# Patient Record
Sex: Male | Born: 1940
Health system: Southern US, Community
[De-identification: ages and names within clinical notes are randomized; demographics above are authoritative.]

## PROBLEM LIST (undated history)

## (undated) DIAGNOSIS — G629 Polyneuropathy, unspecified: Secondary | ICD-10-CM

## (undated) DIAGNOSIS — R011 Cardiac murmur, unspecified: Secondary | ICD-10-CM

## (undated) DIAGNOSIS — T7840XA Allergy, unspecified, initial encounter: Secondary | ICD-10-CM

## (undated) DIAGNOSIS — I4891 Unspecified atrial fibrillation: Secondary | ICD-10-CM

## (undated) DIAGNOSIS — I639 Cerebral infarction, unspecified: Secondary | ICD-10-CM

## (undated) DIAGNOSIS — C4492 Squamous cell carcinoma of skin, unspecified: Secondary | ICD-10-CM

## (undated) DIAGNOSIS — I059 Rheumatic mitral valve disease, unspecified: Secondary | ICD-10-CM

## (undated) DIAGNOSIS — N400 Enlarged prostate without lower urinary tract symptoms: Secondary | ICD-10-CM

## (undated) DIAGNOSIS — I10 Essential (primary) hypertension: Secondary | ICD-10-CM

## (undated) DIAGNOSIS — C189 Malignant neoplasm of colon, unspecified: Secondary | ICD-10-CM

## (undated) DIAGNOSIS — F419 Anxiety disorder, unspecified: Secondary | ICD-10-CM

## (undated) DIAGNOSIS — E785 Hyperlipidemia, unspecified: Secondary | ICD-10-CM

## (undated) DIAGNOSIS — R7309 Other abnormal glucose: Secondary | ICD-10-CM

## (undated) DIAGNOSIS — K219 Gastro-esophageal reflux disease without esophagitis: Secondary | ICD-10-CM

## (undated) DIAGNOSIS — Z972 Presence of dental prosthetic device (complete) (partial): Secondary | ICD-10-CM

## (undated) DIAGNOSIS — M1990 Unspecified osteoarthritis, unspecified site: Secondary | ICD-10-CM

## (undated) DIAGNOSIS — R1031 Right lower quadrant pain: Secondary | ICD-10-CM

## (undated) DIAGNOSIS — R972 Elevated prostate specific antigen [PSA]: Secondary | ICD-10-CM

## (undated) HISTORY — DX: Rheumatic mitral valve disease, unspecified: I05.9

## (undated) HISTORY — DX: Cardiac murmur, unspecified: R01.1

## (undated) HISTORY — DX: Malignant neoplasm of colon, unspecified: C18.9

## (undated) HISTORY — DX: Unspecified osteoarthritis, unspecified site: M19.90

## (undated) HISTORY — DX: Benign prostatic hyperplasia without lower urinary tract symptoms: N40.0

## (undated) HISTORY — DX: Allergy, unspecified, initial encounter: T78.40XA

## (undated) HISTORY — DX: Hyperlipidemia, unspecified: E78.5

## (undated) HISTORY — DX: Elevated prostate specific antigen (PSA): R97.20

## (undated) HISTORY — DX: Right lower quadrant pain: R10.31

## (undated) HISTORY — DX: Gastro-esophageal reflux disease without esophagitis: K21.9

## (undated) HISTORY — DX: Anxiety disorder, unspecified: F41.9

## (undated) HISTORY — PX: MITRAL VALVE ANNULOPLASTY: SHX2038

## (undated) HISTORY — DX: Squamous cell carcinoma of skin, unspecified: C44.92

## (undated) HISTORY — DX: Other abnormal glucose: R73.09

## (undated) HISTORY — DX: Presence of dental prosthetic device (complete) (partial): Z97.2

---

## 1988-02-23 HISTORY — PX: COLECTOMY: SHX59

## 1999-05-21 ENCOUNTER — Other Ambulatory Visit: Admission: RE | Admit: 1999-05-21 | Discharge: 1999-05-21 | Payer: Self-pay | Admitting: Gastroenterology

## 1999-05-21 ENCOUNTER — Encounter (INDEPENDENT_AMBULATORY_CARE_PROVIDER_SITE_OTHER): Payer: Self-pay | Admitting: Specialist

## 2001-09-26 ENCOUNTER — Encounter: Admission: RE | Admit: 2001-09-26 | Discharge: 2001-09-26 | Payer: Self-pay | Admitting: Internal Medicine

## 2001-09-26 ENCOUNTER — Encounter: Payer: Self-pay | Admitting: Internal Medicine

## 2004-03-11 ENCOUNTER — Ambulatory Visit: Payer: Self-pay | Admitting: Internal Medicine

## 2004-03-16 ENCOUNTER — Ambulatory Visit: Payer: Self-pay

## 2004-03-20 ENCOUNTER — Ambulatory Visit: Payer: Self-pay | Admitting: Internal Medicine

## 2004-03-24 ENCOUNTER — Ambulatory Visit: Payer: Self-pay | Admitting: Cardiology

## 2004-03-27 ENCOUNTER — Ambulatory Visit: Payer: Self-pay | Admitting: Internal Medicine

## 2004-04-06 ENCOUNTER — Ambulatory Visit: Payer: Self-pay | Admitting: Internal Medicine

## 2004-04-06 ENCOUNTER — Ambulatory Visit: Payer: Self-pay | Admitting: Oncology

## 2004-04-08 ENCOUNTER — Ambulatory Visit: Payer: Self-pay | Admitting: Cardiology

## 2004-04-10 ENCOUNTER — Ambulatory Visit: Payer: Self-pay | Admitting: Internal Medicine

## 2004-04-24 ENCOUNTER — Ambulatory Visit: Payer: Self-pay | Admitting: Cardiology

## 2004-04-24 ENCOUNTER — Encounter: Payer: Self-pay | Admitting: Cardiology

## 2004-04-24 ENCOUNTER — Ambulatory Visit (HOSPITAL_COMMUNITY): Admission: RE | Admit: 2004-04-24 | Discharge: 2004-04-24 | Payer: Self-pay | Admitting: Cardiology

## 2004-05-11 ENCOUNTER — Ambulatory Visit: Payer: Self-pay | Admitting: Internal Medicine

## 2004-05-14 ENCOUNTER — Ambulatory Visit: Payer: Self-pay | Admitting: Cardiology

## 2004-05-19 ENCOUNTER — Inpatient Hospital Stay (HOSPITAL_BASED_OUTPATIENT_CLINIC_OR_DEPARTMENT_OTHER): Admission: RE | Admit: 2004-05-19 | Discharge: 2004-05-19 | Payer: Self-pay | Admitting: Cardiology

## 2004-07-27 ENCOUNTER — Encounter: Admission: RE | Admit: 2004-07-27 | Discharge: 2004-10-25 | Payer: Self-pay | Admitting: Cardiology

## 2004-09-04 ENCOUNTER — Ambulatory Visit: Payer: Self-pay | Admitting: Internal Medicine

## 2004-12-11 ENCOUNTER — Ambulatory Visit: Payer: Self-pay | Admitting: Internal Medicine

## 2005-01-12 ENCOUNTER — Ambulatory Visit: Payer: Self-pay | Admitting: Internal Medicine

## 2005-03-09 ENCOUNTER — Ambulatory Visit: Payer: Self-pay | Admitting: Internal Medicine

## 2005-04-15 ENCOUNTER — Ambulatory Visit: Payer: Self-pay | Admitting: Oncology

## 2005-09-23 ENCOUNTER — Ambulatory Visit: Payer: Self-pay | Admitting: Internal Medicine

## 2006-01-26 ENCOUNTER — Ambulatory Visit: Payer: Self-pay | Admitting: Internal Medicine

## 2006-04-28 ENCOUNTER — Ambulatory Visit: Payer: Self-pay | Admitting: Oncology

## 2006-05-03 LAB — CBC WITH DIFFERENTIAL/PLATELET
Basophils Absolute: 0 10*3/uL (ref 0.0–0.1)
EOS%: 1.9 % (ref 0.0–7.0)
HGB: 15.5 g/dL (ref 13.0–17.1)
MCH: 28.7 pg (ref 28.0–33.4)
NEUT#: 3.7 10*3/uL (ref 1.5–6.5)
RBC: 5.4 10*6/uL (ref 4.20–5.71)
RDW: 13.2 % (ref 11.2–14.6)
lymph#: 1.2 10*3/uL (ref 0.9–3.3)

## 2006-05-03 LAB — COMPREHENSIVE METABOLIC PANEL
ALT: 20 U/L (ref 0–53)
AST: 20 U/L (ref 0–37)
Albumin: 4.3 g/dL (ref 3.5–5.2)
Alkaline Phosphatase: 42 U/L (ref 39–117)
BUN: 14 mg/dL (ref 6–23)
Calcium: 9.4 mg/dL (ref 8.4–10.5)
Chloride: 105 mEq/L (ref 96–112)
Potassium: 4.2 mEq/L (ref 3.5–5.3)
Sodium: 142 mEq/L (ref 135–145)
Total Protein: 7 g/dL (ref 6.0–8.3)

## 2006-05-03 LAB — CEA: CEA: 1.6 ng/mL (ref 0.0–5.0)

## 2006-05-27 ENCOUNTER — Encounter: Payer: Self-pay | Admitting: Internal Medicine

## 2006-06-15 ENCOUNTER — Encounter: Payer: Self-pay | Admitting: Internal Medicine

## 2006-06-15 ENCOUNTER — Ambulatory Visit: Payer: Self-pay | Admitting: Internal Medicine

## 2006-06-15 DIAGNOSIS — E785 Hyperlipidemia, unspecified: Secondary | ICD-10-CM

## 2006-06-15 DIAGNOSIS — I059 Rheumatic mitral valve disease, unspecified: Secondary | ICD-10-CM

## 2006-06-15 DIAGNOSIS — J309 Allergic rhinitis, unspecified: Secondary | ICD-10-CM

## 2006-06-15 DIAGNOSIS — Z85038 Personal history of other malignant neoplasm of large intestine: Secondary | ICD-10-CM | POA: Insufficient documentation

## 2006-06-15 HISTORY — DX: Hyperlipidemia, unspecified: E78.5

## 2006-06-15 LAB — CONVERTED CEMR LAB
Cholesterol: 204 mg/dL (ref 0–200)
Direct LDL: 135.6 mg/dL
HDL: 49.6 mg/dL (ref >39.0)
Total CHOL/HDL Ratio: 4.1
Triglycerides: 63 mg/dL (ref 0–149)
VLDL: 13 mg/dL (ref 0–40)

## 2006-07-05 ENCOUNTER — Ambulatory Visit: Payer: Self-pay | Admitting: Internal Medicine

## 2006-10-13 ENCOUNTER — Encounter: Payer: Self-pay | Admitting: Internal Medicine

## 2007-01-23 ENCOUNTER — Encounter: Payer: Self-pay | Admitting: Internal Medicine

## 2007-02-08 ENCOUNTER — Encounter: Payer: Self-pay | Admitting: Internal Medicine

## 2007-03-10 ENCOUNTER — Telehealth (INDEPENDENT_AMBULATORY_CARE_PROVIDER_SITE_OTHER): Payer: Self-pay | Admitting: *Deleted

## 2007-03-10 ENCOUNTER — Ambulatory Visit: Payer: Self-pay | Admitting: Internal Medicine

## 2007-05-03 ENCOUNTER — Ambulatory Visit: Payer: Self-pay | Admitting: Oncology

## 2007-05-05 ENCOUNTER — Encounter: Payer: Self-pay | Admitting: Internal Medicine

## 2007-05-05 LAB — COMPREHENSIVE METABOLIC PANEL
ALT: 17 U/L (ref 0–53)
AST: 22 U/L (ref 0–37)
Albumin: 4.5 g/dL (ref 3.5–5.2)
Calcium: 8.9 mg/dL (ref 8.4–10.5)
Chloride: 104 mEq/L (ref 96–112)
Creatinine, Ser: 0.94 mg/dL (ref 0.40–1.50)
Potassium: 4.2 mEq/L (ref 3.5–5.3)
Sodium: 142 mEq/L (ref 135–145)
Total Protein: 6.9 g/dL (ref 6.0–8.3)

## 2007-05-05 LAB — CBC WITH DIFFERENTIAL/PLATELET
BASO%: 0.8 % (ref 0.0–2.0)
EOS%: 2 % (ref 0.0–7.0)
MCH: 29.2 pg (ref 28.0–33.4)
MCHC: 35.1 g/dL (ref 32.0–35.9)
RBC: 5.35 10*6/uL (ref 4.20–5.71)
RDW: 12.8 % (ref 11.2–14.6)
lymph#: 1.3 10*3/uL (ref 0.9–3.3)

## 2007-05-05 LAB — CEA: CEA: 1 ng/mL (ref 0.0–5.0)

## 2007-05-05 LAB — PSA: PSA: 3.52 ng/mL (ref 0.10–4.00)

## 2007-05-23 ENCOUNTER — Encounter: Payer: Self-pay | Admitting: Internal Medicine

## 2007-06-22 ENCOUNTER — Ambulatory Visit: Payer: Self-pay | Admitting: Internal Medicine

## 2007-06-28 LAB — CONVERTED CEMR LAB
ALT: 20 units/L (ref 0–53)
AST: 24 units/L (ref 0–37)
BUN: 17 mg/dL (ref 6–23)
Basophils Absolute: 0 10*3/uL (ref 0.0–0.1)
Basophils Relative: 0.8 % (ref 0.0–1.0)
CO2: 32 meq/L (ref 19–32)
Calcium: 9.2 mg/dL (ref 8.4–10.5)
Chloride: 106 meq/L (ref 96–112)
Cholesterol: 152 mg/dL (ref 0–200)
Creatinine, Ser: 0.9 mg/dL (ref 0.4–1.5)
Eosinophils Absolute: 0.2 10*3/uL (ref 0.0–0.7)
Eosinophils Relative: 5 % (ref 0.0–5.0)
GFR calc Af Amer: 108 mL/min
GFR calc non Af Amer: 89 mL/min
Glucose, Bld: 114 mg/dL — ABNORMAL HIGH (ref 70–99)
HCT: 45.2 % (ref 39.0–52.0)
HDL: 47.1 mg/dL (ref >39.0)
Hemoglobin: 15.4 g/dL (ref 13.0–17.0)
Hgb A1c MFr Bld: 5.6 % (ref 4.6–6.0)
LDL Cholesterol: 96 mg/dL (ref 0–99)
Lymphocytes Relative: 35.4 % (ref 12.0–46.0)
MCHC: 34.2 g/dL (ref 30.0–36.0)
MCV: 86.2 fL (ref 78.0–100.0)
Monocytes Absolute: 0.2 10*3/uL (ref 0.1–1.0)
Monocytes Relative: 6.3 % (ref 3.0–12.0)
Neutro Abs: 1.9 10*3/uL (ref 1.4–7.7)
Neutrophils Relative %: 52.5 % (ref 43.0–77.0)
Platelets: 211 10*3/uL (ref 150–400)
Potassium: 4.1 meq/L (ref 3.5–5.1)
RBC: 5.24 M/uL (ref 4.22–5.81)
RDW: 12.2 % (ref 11.5–14.6)
Sodium: 141 meq/L (ref 135–145)
TSH: 1.9 microintl units/mL (ref 0.35–5.50)
Total CHOL/HDL Ratio: 3.2
Triglycerides: 44 mg/dL (ref 0–149)
VLDL: 9 mg/dL (ref 0–40)
WBC: 3.6 10*3/uL — ABNORMAL LOW (ref 4.5–10.5)

## 2007-08-18 ENCOUNTER — Ambulatory Visit: Payer: Self-pay | Admitting: Gastroenterology

## 2007-08-30 ENCOUNTER — Ambulatory Visit: Payer: Self-pay | Admitting: Gastroenterology

## 2007-11-07 ENCOUNTER — Encounter: Payer: Self-pay | Admitting: Internal Medicine

## 2007-11-07 LAB — CONVERTED CEMR LAB: PSA: 3.4 ng/mL

## 2007-11-17 ENCOUNTER — Ambulatory Visit: Payer: Self-pay | Admitting: Internal Medicine

## 2007-11-29 ENCOUNTER — Ambulatory Visit: Payer: Self-pay | Admitting: Internal Medicine

## 2007-12-22 ENCOUNTER — Ambulatory Visit: Payer: Self-pay | Admitting: Internal Medicine

## 2008-01-23 ENCOUNTER — Encounter: Payer: Self-pay | Admitting: Internal Medicine

## 2008-05-13 ENCOUNTER — Encounter: Payer: Self-pay | Admitting: Internal Medicine

## 2008-05-15 ENCOUNTER — Encounter: Payer: Self-pay | Admitting: Internal Medicine

## 2008-07-31 ENCOUNTER — Encounter: Payer: Self-pay | Admitting: Internal Medicine

## 2008-07-31 LAB — CONVERTED CEMR LAB: PSA: 3.7 ng/mL

## 2008-09-12 ENCOUNTER — Ambulatory Visit: Payer: Self-pay | Admitting: Internal Medicine

## 2008-09-30 ENCOUNTER — Ambulatory Visit: Payer: Self-pay | Admitting: Internal Medicine

## 2008-10-16 ENCOUNTER — Ambulatory Visit: Payer: Self-pay | Admitting: Internal Medicine

## 2008-10-16 DIAGNOSIS — N400 Enlarged prostate without lower urinary tract symptoms: Secondary | ICD-10-CM

## 2008-10-16 DIAGNOSIS — R7309 Other abnormal glucose: Secondary | ICD-10-CM

## 2008-10-16 DIAGNOSIS — R739 Hyperglycemia, unspecified: Secondary | ICD-10-CM | POA: Insufficient documentation

## 2008-10-16 HISTORY — DX: Other abnormal glucose: R73.09

## 2008-10-16 HISTORY — DX: Benign prostatic hyperplasia without lower urinary tract symptoms: N40.0

## 2008-10-16 LAB — CONVERTED CEMR LAB: Vit D, 25-Hydroxy: 39 ng/mL (ref 30–89)

## 2008-10-18 ENCOUNTER — Encounter (INDEPENDENT_AMBULATORY_CARE_PROVIDER_SITE_OTHER): Payer: Self-pay | Admitting: *Deleted

## 2008-10-21 LAB — CONVERTED CEMR LAB
ALT: 21 units/L (ref 0–53)
AST: 23 units/L (ref 0–37)
BUN: 14 mg/dL (ref 6–23)
Basophils Absolute: 0 10*3/uL (ref 0.0–0.1)
Basophils Relative: 0.3 % (ref 0.0–3.0)
CO2: 31 meq/L (ref 19–32)
Calcium: 9.3 mg/dL (ref 8.4–10.5)
Chloride: 105 meq/L (ref 96–112)
Cholesterol: 159 mg/dL (ref 0–200)
Creatinine, Ser: 1 mg/dL (ref 0.4–1.5)
Eosinophils Absolute: 0.1 10*3/uL (ref 0.0–0.7)
Eosinophils Relative: 2.1 % (ref 0.0–5.0)
GFR calc non Af Amer: 78.48 mL/min (ref >60)
Glucose, Bld: 84 mg/dL (ref 70–99)
HCT: 43.5 % (ref 39.0–52.0)
HDL: 51.1 mg/dL (ref >39.00)
Hemoglobin: 15.1 g/dL (ref 13.0–17.0)
Hgb A1c MFr Bld: 5.5 % (ref 4.6–6.5)
LDL Cholesterol: 97 mg/dL (ref 0–99)
Lymphocytes Relative: 25.9 % (ref 12.0–46.0)
Lymphs Abs: 1.4 10*3/uL (ref 0.7–4.0)
MCHC: 34.6 g/dL (ref 30.0–36.0)
MCV: 84.7 fL (ref 78.0–100.0)
Monocytes Absolute: 0.2 10*3/uL (ref 0.1–1.0)
Monocytes Relative: 4.2 % (ref 3.0–12.0)
Neutro Abs: 3.8 10*3/uL (ref 1.4–7.7)
Neutrophils Relative %: 67.5 % (ref 43.0–77.0)
Platelets: 177 10*3/uL (ref 150.0–400.0)
Potassium: 4.1 meq/L (ref 3.5–5.1)
RBC: 5.14 M/uL (ref 4.22–5.81)
RDW: 12.5 % (ref 11.5–14.6)
Sodium: 141 meq/L (ref 135–145)
Total CHOL/HDL Ratio: 3
Triglycerides: 53 mg/dL (ref 0.0–149.0)
VLDL: 10.6 mg/dL (ref 0.0–40.0)
WBC: 5.5 10*3/uL (ref 4.5–10.5)

## 2008-10-29 ENCOUNTER — Encounter: Payer: Self-pay | Admitting: Internal Medicine

## 2008-10-29 ENCOUNTER — Telehealth: Payer: Self-pay | Admitting: Internal Medicine

## 2009-01-28 ENCOUNTER — Encounter: Payer: Self-pay | Admitting: Internal Medicine

## 2009-02-04 ENCOUNTER — Encounter: Payer: Self-pay | Admitting: Internal Medicine

## 2009-03-12 ENCOUNTER — Telehealth (INDEPENDENT_AMBULATORY_CARE_PROVIDER_SITE_OTHER): Payer: Self-pay | Admitting: *Deleted

## 2009-06-09 ENCOUNTER — Encounter: Payer: Self-pay | Admitting: Internal Medicine

## 2009-06-20 ENCOUNTER — Telehealth (INDEPENDENT_AMBULATORY_CARE_PROVIDER_SITE_OTHER): Payer: Self-pay | Admitting: *Deleted

## 2009-06-23 ENCOUNTER — Ambulatory Visit: Payer: Self-pay | Admitting: Internal Medicine

## 2009-09-09 ENCOUNTER — Encounter: Payer: Self-pay | Admitting: Internal Medicine

## 2009-10-21 ENCOUNTER — Encounter: Payer: Self-pay | Admitting: Internal Medicine

## 2009-11-07 ENCOUNTER — Encounter: Payer: Self-pay | Admitting: Internal Medicine

## 2009-12-11 ENCOUNTER — Encounter: Payer: Self-pay | Admitting: Internal Medicine

## 2010-03-22 LAB — CONVERTED CEMR LAB: PSA: 3.3 ng/mL

## 2010-03-26 NOTE — Letter (Signed)
Summary: Rx contin. observation---- [facility]  [facility]   Imported By: Lanelle Bal 11/18/2009 12:56:35  _____________________________________________________________________  External Attachment:    Type:   Image     Comment:   External Document

## 2010-03-26 NOTE — Assessment & Plan Note (Signed)
Summary: bp check/lch  Nurse Visit   Vital Signs:  Patient profile:   70 year old male Height:      69.5 inches Weight:      179.2 pounds Pulse rate:   60 / minute BP sitting:   102 / 60  Vitals Entered By: Shary Decamp (Jun 23, 2009 3:51 PM)  Past History:  Past Medical History: Allergic rhinitis Hyperlipidemia MV recontruction at [facility] 2006----still needs ABX for SBE prophylaxis  Colon Cancer s/p partial colectomy 1990,   Cscope 7-  2009; (-) , next in 5 years (2014) Needs ABX PROPHYLAXIS before dental -GI-GU procedures  (-) Cardiolite 2006 BPH w/  borderline elevated PSA , sees urology   Past Surgical History: Reviewed history from 09/30/2008 and no changes required. MV recontruction at [facility] 2006 Colon Cancer s/p partial colectomy 1990   Physical Exam  General:  alert and well-developed.   Lungs:  normal respiratory effort, no intercostal retractions, no accessory muscle use, and normal breath sounds.   Heart:  normal rate, regular rhythm, and no murmur.   Msk:  left shoulder normal right shoulder normal except for slightly decreased range of motion Psych:  not anxious appearing and not depressed appearing.     Review of Systems        MV recontruction-- saw surgeon 4-011,  note reviewed still needs Abx for SBE  CV:  Denies chest pain or discomfort and swelling of feet; good ambulatory BPs . Resp:  Denies cough and shortness of breath.   Impression & Recommendations:  Problem # 1:  HYPERLIPIDEMIA (ICD-272.4) no change, rf medication His updated medication list for this problem includes:    Simvastatin 20 Mg Tabs (Simvastatin) .Marland Kitchen... 1 by mouth once daily  Labs Reviewed: SGOT: 23 (10/16/2008)   SGPT: 21 (10/16/2008)   HDL:51.10 (10/16/2008), 47.1 (06/22/2007)  LDL:97 (10/16/2008), 96 (06/22/2007)  Chol:159 (10/16/2008), 152 (06/22/2007)  Trig:53.0 (10/16/2008), 44 (06/22/2007)  Problem # 2:  DISORDER, MITRAL VALVE (ICD-424.0) stable His updated medication  list for this problem includes:    Aspirin 325 Mg Tabs (Aspirin)  Problem # 3:  shoulder pain plans to do some exercises and  let me  know if no better, consider orthopedic surgery referral  Complete Medication List: 1)  Simvastatin 20 Mg Tabs (Simvastatin) .Marland Kitchen.. 1 by mouth once daily 2)  Aspirin 325 Mg Tabs (Aspirin) 3)  Mvi  4)  Doxazosin Mesylate 2 Mg Tabs (Doxazosin mesylate) .Marland Kitchen.. 1 by mouth once daily 5)  Needs Abx For Sbe Prophylaxis    Patient Instructions: 1)  Please schedule a follow-up appointment in 6 months  (fasting-- yearly)   History of Present Illness: ROV feels well   mild right shoulder pain for a while, on and off, symptoms are triggered by elevated and moving forward his right arm    CC: rov   Current Medications (verified): 1)  Simvastatin 20 Mg  Tabs (Simvastatin) .Marland Kitchen.. 1 By Mouth Once Daily - Due Office Visit For Additional Refills 2)  Aspirin 325 Mg  Tabs (Aspirin) 3)  Mvi 4)  Doxazosin Mesylate 2 Mg Tabs (Doxazosin Mesylate) .Marland Kitchen.. 1 By Mouth Once Daily  Allergies (verified): No Known Drug Allergies  Orders Added: 1)  Est. Patient Level III [16109] Prescriptions: SIMVASTATIN 20 MG  TABS (SIMVASTATIN) 1 by mouth once daily  #90 x 3   Entered and Authorized by:   Nolon Rod. Shiara Mcgough MD   Signed by:   Nolon Rod. Jaise Moser MD on 06/23/2009   Method  used:   Electronically to        [facility]* (retail)       [address] [place], [place]  [address]       Ph: [phone]       Fax: [phone]   RxID:   [id number]

## 2010-03-26 NOTE — Letter (Signed)
Summary: plan varicose vein treatment---[place] MD  Consuela Mimes MD   Imported By: Lanelle Bal 01/08/2010 13:10:58  _____________________________________________________________________  External Attachment:    Type:   Image     Comment:   External Document

## 2010-03-26 NOTE — Letter (Signed)
Summary: venous insuf.,cont. conservative Rx---[facility]  [facility]   Imported By: Lanelle Bal 11/06/2009 10:39:40  _____________________________________________________________________  External Attachment:    Type:   Image     Comment:   External Document

## 2010-03-26 NOTE — Progress Notes (Signed)
Summary: ?change dx code  Phone Note Call from Patient   Summary of Call: Vitamin D (test code 16109)  (CPT [phone]) lab was coded as V82.81 for DOS 10/29/08.   Medicare is denying that code.  Can you change dx code?  Patient has received a bill for $155.75. Patient's acct # with spectrum [id number]; [facility] [phone] Shary Decamp  March 12, 2009 3:27 PM   Follow-up for Phone Call        we can try any of these: HYPERGLYCEMIA, BORDERLINE (ICD-790.29) HYPERTROPHY PROSTATE W/O UR OBST & OTH LUTS (ICD-600.00) DISORDER, MITRAL VALVE (ICD-424.0) HYPERLIPIDEMIA (ICD-272.4) COLON CANCER, HX OF (ICD-V10.05) ALLERGIC RHINITIS (ICD-477.9) Jose E. [name] MD  March 13, 2009 8:40 AM    Additional Follow-up for Phone Call Additional follow up Details #1::        ---- Converted from flag ---- ---- 03/13/2009 12:48 PM, Floydene Flock wrote: resubmitted with [facility] today 03-13-09 ------------------------------

## 2010-03-26 NOTE — Letter (Signed)
Summary: [facility]  [facility]   Imported By: Lanelle Bal 06/23/2009 11:12:37  _____________________________________________________________________  External Attachment:    Type:   Image     Comment:   External Document

## 2010-03-26 NOTE — Letter (Signed)
Summary: [facility]  [facility]   Imported By: Lanelle Bal 10/13/2009 11:28:01  _____________________________________________________________________  External Attachment:    Type:   Image     Comment:   External Document

## 2010-03-26 NOTE — Progress Notes (Signed)
Summary: due ov  Phone Note Outgoing Call Call back at [facility] Phone [phone] Call back at Work Phone [phone]   Summary of Call: due office visit Kevin Bass  June 20, 2009 1:48 PM   Follow-up for Phone Call        patient is coming in on monday 5.2.11 Follow-up by: Harold Barban,  June 20, 2009 2:05 PM

## 2010-04-16 ENCOUNTER — Telehealth: Payer: Self-pay | Admitting: Internal Medicine

## 2010-04-20 ENCOUNTER — Encounter: Payer: Self-pay | Admitting: Internal Medicine

## 2010-04-21 ENCOUNTER — Other Ambulatory Visit: Payer: Self-pay | Admitting: Internal Medicine

## 2010-04-21 ENCOUNTER — Encounter: Payer: Self-pay | Admitting: Internal Medicine

## 2010-04-21 ENCOUNTER — Ambulatory Visit (INDEPENDENT_AMBULATORY_CARE_PROVIDER_SITE_OTHER): Payer: Medicare Other | Admitting: Internal Medicine

## 2010-04-21 DIAGNOSIS — I059 Rheumatic mitral valve disease, unspecified: Secondary | ICD-10-CM

## 2010-04-21 DIAGNOSIS — Z0000 Encounter for general adult medical examination without abnormal findings: Secondary | ICD-10-CM

## 2010-04-21 DIAGNOSIS — E785 Hyperlipidemia, unspecified: Secondary | ICD-10-CM

## 2010-04-21 DIAGNOSIS — M25519 Pain in unspecified shoulder: Secondary | ICD-10-CM

## 2010-04-21 DIAGNOSIS — R7309 Other abnormal glucose: Secondary | ICD-10-CM

## 2010-04-21 DIAGNOSIS — Z23 Encounter for immunization: Secondary | ICD-10-CM

## 2010-04-21 LAB — LIPID PANEL
Cholesterol: 189 mg/dL (ref 0–200)
HDL: 58.2 mg/dL (ref >39.00)
Total CHOL/HDL Ratio: 3
Triglycerides: 57 mg/dL (ref 0.0–149.0)

## 2010-04-21 LAB — CBC WITH DIFFERENTIAL/PLATELET
Basophils Relative: 0.6 % (ref 0.0–3.0)
Eosinophils Absolute: 0.1 10*3/uL (ref 0.0–0.7)
Lymphocytes Relative: 31.1 % (ref 12.0–46.0)
MCHC: 34.6 g/dL (ref 30.0–36.0)
Neutrophils Relative %: 61.5 % (ref 43.0–77.0)
Platelets: 205 10*3/uL (ref 150.0–400.0)
RBC: 5.37 Mil/uL (ref 4.22–5.81)
WBC: 6.2 10*3/uL (ref 4.5–10.5)

## 2010-04-21 LAB — BASIC METABOLIC PANEL
BUN: 18 mg/dL (ref 6–23)
CO2: 30 mEq/L (ref 19–32)
Calcium: 9.2 mg/dL (ref 8.4–10.5)
Creatinine, Ser: 0.9 mg/dL (ref 0.4–1.5)

## 2010-04-21 LAB — AST: AST: 22 U/L (ref 0–37)

## 2010-04-21 LAB — CK: Total CK: 101 U/L (ref 7–232)

## 2010-04-21 LAB — HEMOGLOBIN A1C: Hgb A1c MFr Bld: 5.7 % (ref 4.6–6.5)

## 2010-04-21 LAB — SEDIMENTATION RATE: Sed Rate: 3 mm/hr (ref 0–22)

## 2010-04-21 NOTE — Progress Notes (Signed)
Summary: Muscle pain--stopped statin  Phone Note Call from Patient Call back at [phone]   Summary of Call: Patient left message on triage requesting a call about his Simvastatin and muscle pain. Patient has been on the med for 2-3 years and muscle pain began approximately 6 months ago. He stopped the med yesterday and called our office today for advise. He notes that his nephew has a similar episode and stopped Lipitor but began Niacin. Pt would like to know if that is what he should do. Please advise. Initial call taken by: Lucious Groves CMA,  April 16, 2010 2:54 PM  Follow-up for Phone Call         hold statins for now, he is due for a physical exam, fasting. Please schedule  Follow-up by: Jose E. [name] MD,  April 16, 2010 5:35 PM  Additional Follow-up for Phone Call Additional follow up Details #1::        I spoke w/ pt he is aware- CPX scheduled. Army Fossa CMA  April 17, 2010 9:44 AM

## 2010-04-23 ENCOUNTER — Encounter: Payer: Self-pay | Admitting: Internal Medicine

## 2010-04-30 NOTE — Assessment & Plan Note (Signed)
Summary: cpe/ok per danielle/kn   Vital Signs:  Patient profile:   70 year old male Height:      69.5 inches Weight:      179.38 pounds BMI:     26.20 Pulse rate:   82 / minute Pulse rhythm:   regular BP sitting:   126 / 84  (left arm) Cuff size:   large  Vitals Entered By: Army Fossa CMA (April 21, 2010 1:12 PM)   CC: CPX, fasting  Comments c/o pain still in both shoulders [address]    History of Present Illness: Here for Medicare AWV: 1.   Risk factors based on Past M, S, F history: review  2.   Physical Activities: active at home, yard , walks outinely  3.   Depression/mood: no problems reported or noted  4.   Hearing: no problems reported or noted  5.   ADL's:  totally independent 6.   Fall Risk: low risk, no recent falls  7.   Home Safety: does feel safe at home  8.   Height, weight, &visual acuity: see Vs, no problems reported or noted  9.   Counseling:  yes  10.   Labs ordered based on risk factors: yes 11.           Referral Coordination, if needed  12.           Care Plan, see a/p  13.            Cognitive Assessment , motor skills  cognition and coordination seem normal   in addition, we discussed the following :  B shoulder pain x 6 months, L>R, at the [facility] joint area and deltoid area, worse at night saw ortho a month ago, XRs done , Rx aleve, no help told he may need a MRI self d/c statins 1 week ago: slightly  better?  see review of systems  Hyperlipidemia-- see above   MV recontruction at [facility] 2006---- saw cards yesterday, has a ECHO done, results pening   BPH -- sees urology routinely, last OV 9-11, Rx observation and PSAs annually   Preventive Screening-Counseling & Management  Alcohol-Tobacco     Alcohol type: wine  Caffeine-Diet-Exercise     Type of exercise: walking  Current Medications (verified): 1)  Simvastatin 20 Mg  Tabs (Simvastatin) .Marland Kitchen.. 1 By Mouth Once Daily 2)  Aspirin 325 Mg  Tabs (Aspirin) 3)  Mvi 4)   Doxazosin Mesylate 2 Mg Tabs (Doxazosin Mesylate) .Marland Kitchen.. 1 By Mouth Once Daily 5)  Needs Abx For Sbe Prophylaxis  Allergies (verified): No Known Drug Allergies  Past History:  Past Medical History: Allergic rhinitis Hyperlipidemia MV recontruction at [facility] 2006----still needs ABX for SBE prophylaxis  Colon Cancer s/p partial colectomy 1990,   Cscope 7-  2009; (-) , next in 5 years (2014) Needs ABX PROPHYLAXIS before dental -GI-GU procedures  (-) Cardiolite 2006 BPH w/  borderline elevated PSA , sees urology  hyperglycemia , A1C  ~  5.5  Past Surgical History: Reviewed history from 09/30/2008 and no changes required. MV recontruction at [facility] 2006 Colon Cancer s/p partial colectomy 1990  Family History: Reviewed history from 10/16/2008 and no changes required. strokes-no heart attacks--no diabetes--no colon ca--  M side (cousines) prostate ca-- not to his knowledge   Social History: Reviewed history from 10/16/2008 and no changes required. Married 1 son and 1 daughter  from [place] Former Smoker-- quit in the 80s Alcohol use-yes (occasionally) Regular exercise-yes diet-- healthy  Review of Systems General:  Denies fatigue and fever;  Denies any fevers, headaches, neck pain or leg pain. no Amaurosix-fugax. CV:  Denies chest pain or discomfort and swelling of feet. Resp:  Denies cough and shortness of breath. GI:  Denies bloody stools, diarrhea, nausea, and vomiting.  Physical Exam  General:  alert, well-developed, and well-nourished.   Head:   no tender to palpation at the temporal artery area Neck:  no masses, no thyromegaly, and normal carotid upstroke.   Lungs:  normal respiratory effort, no intercostal retractions, no accessory muscle use, and normal breath sounds.   Heart:  normal rate, regular rhythm, and no murmur.   Abdomen:  soft, non-tender, no distention, and no masses.   Extremities:   no lower extremity edema.  shoulders symmetric, nontender to palpation,  range of motion essentially normal.  lower extremity muscle mass is palpated, nontender Psych:  Cognition and judgment appear intact. Alert and cooperative with normal attention span and concentration.  not anxious appearing and not depressed appearing.     Impression & Recommendations:  Problem # 1:  HEALTH SCREENING (ICD-V70.0)  Td 08 Last Pneumovax:  Pneumovax 2006 and today pt reports he had a  shingles shot already   PSAs per urology, saw  urology [phone]  last colonoscopy 08-2007 normal, next 2014   continue with his healthy lifestyle   Orders: Medicare -1st Annual Wellness Visit [phone])  Problem # 2:  SHOULDER PAIN (ICD-719.41) bilateral shoulder pain for 6 months, could be related to statins. Status post evaluation by orthopedic surgery month ago Plan:  stay away from statins for a month   labs Reassess then His updated medication list for this problem includes:    Aspirin 81 Mg Tbec (Aspirin) .Marland Kitchen... 1 a day  Orders: TLB-Sedimentation Rate (ESR) (85652-ESR) TLB-CK Total Only(Creatine Kinase/CPK) (82550-CK) Specimen Handling (14782)  Problem # 3:  HYPERGLYCEMIA, BORDERLINE (ICD-790.29)  labs  Orders: TLB-A1C / Hgb A1C (Glycohemoglobin) (83036-A1C) Specimen Handling (95621)  Problem # 4:  DISORDER, MITRAL VALVE (ICD-424.0) apparently doing well,  had an echocardiogram yesterday at  the cardiac surgeon  office His updated medication list for this problem includes:    Aspirin 81 Mg Tbec (Aspirin) .Marland Kitchen... 1 a day  Orders: Venipuncture (30865) TLB-BMP (Basic Metabolic Panel-BMET) (80048-METABOL) TLB-CBC Platelet - w/Differential (85025-CBCD) Specimen Handling (78469)  Problem # 5:  HYPERLIPIDEMIA (ICD-272.4)  see history of present illness , he has been off his statins for one week  reassess in one month His updated medication list for this problem includes:    Simvastatin 20 Mg Tabs (Simvastatin) .Marland Kitchen... 1 by mouth once daily====hold    Labs Reviewed: SGOT:  23 (10/16/2008)   SGPT: 21 (10/16/2008)   HDL:51.10 (10/16/2008), 47.1 (06/22/2007)  LDL:97 (10/16/2008), 96 (06/22/2007)  Chol:159 (10/16/2008), 152 (06/22/2007)  Trig:53.0 (10/16/2008), 44 (06/22/2007)  Orders: TLB-Lipid Panel (80061-LIPID) TLB-TSH (Thyroid Stimulating Hormone) (84443-TSH) TLB-ALT (SGPT) (84460-ALT) TLB-AST (SGOT) (84450-SGOT) Specimen Handling (62952)  Complete Medication List: 1)  Simvastatin 20 Mg Tabs (Simvastatin) .Marland Kitchen.. 1 by mouth once daily====hold 2)  Aspirin 81 Mg Tbec (Aspirin) .Marland Kitchen.. 1 a day 3)  Mvi  4)  Doxazosin Mesylate 2 Mg Tabs (Doxazosin mesylate) .Marland Kitchen.. 1 by mouth once daily 5)  Needs Abx For Sbe Prophylaxis   Other Orders: Pneumococcal Vaccine (84132) Admin 1st Vaccine (44010)  Patient Instructions: 1)  Please schedule a follow-up appointment in 1 month.    Orders Added: 1)  Venipuncture [36415] 2)  TLB-BMP (Basic Metabolic Panel-BMET) [80048-METABOL] 3)  TLB-CBC  Platelet - w/Differential [85025-CBCD] 4)  TLB-Lipid Panel [80061-LIPID] 5)  TLB-TSH (Thyroid Stimulating Hormone) [84443-TSH] 6)  TLB-ALT (SGPT) [84460-ALT] 7)  TLB-AST (SGOT) [84450-SGOT] 8)  TLB-Sedimentation Rate (ESR) [85652-ESR] 9)  TLB-CK Total Only(Creatine Kinase/CPK) [82550-CK] 10)  TLB-A1C / Hgb A1C (Glycohemoglobin) [83036-A1C] 11)  Specimen Handling [99000] 12)  Pneumococcal Vaccine [90732] 13)  Admin 1st Vaccine [90471] 14)  Medicare -1st Annual Wellness Visit [G0438] 15)  Est. Patient Level III [16109]   Immunizations Administered:  Pneumonia Vaccine:    Vaccine Type: Pneumovax    Site: left deltoid    Mfr: Merck    Dose: 0.5 ml    Route: IM    Given by: Army Fossa CMA    Exp. Date: 06/24/2011    Lot #: [id number]   Immunizations Administered:  Pneumonia Vaccine:    Vaccine Type: Pneumovax    Site: left deltoid    Mfr: Merck    Dose: 0.5 ml    Route: IM    Given by: Army Fossa CMA    Exp. Date: 06/24/2011    Lot #: [id number]   Risk  Factors:     Type:  wine Exercise:  yes    Type:  walking

## 2010-05-12 NOTE — Letter (Signed)
Summary: stable, f/u 1 year-Cardiology  [facility]   Imported By: Maryln Gottron 05/01/2010 14:17:20  _____________________________________________________________________  External Attachment:    Type:   Image     Comment:   External Document

## 2010-05-20 ENCOUNTER — Ambulatory Visit: Payer: Self-pay | Admitting: Internal Medicine

## 2010-05-20 ENCOUNTER — Ambulatory Visit (INDEPENDENT_AMBULATORY_CARE_PROVIDER_SITE_OTHER): Payer: Medicare Other | Admitting: Internal Medicine

## 2010-05-20 ENCOUNTER — Encounter: Payer: Self-pay | Admitting: Internal Medicine

## 2010-05-20 DIAGNOSIS — M25519 Pain in unspecified shoulder: Secondary | ICD-10-CM

## 2010-05-20 DIAGNOSIS — E785 Hyperlipidemia, unspecified: Secondary | ICD-10-CM

## 2010-05-20 MED ORDER — PRAVASTATIN SODIUM 40 MG PO TABS: 40.0000 mg | ORAL_TABLET | Freq: Every day | ORAL | Status: DC

## 2010-05-20 NOTE — Progress Notes (Addendum)
  Subjective:   Patient ID: Kevin Bass, male    DOB: [birth year], 70 y.o.   MRN: [id number]  HPI   At last OV he had shoulder pain, he is holding simvastatin x ~ 3 or 4 weeks, pain is better    Past Medical History  Diagnosis Date  . Colon cancer     s/p partial colectomy 1990, Cscope neg 7-09, next 2014  . DISORDER, MITRAL VALVE 06/15/2006    still needs ABX prophylaxis   . HYPERLIPIDEMIA 06/15/2006  . HYPERTROPHY PROSTATE W/O UR OBST & OTH LUTS 10/16/2008  . HYPERGLYCEMIA, BORDERLINE 10/16/2008  . Allergic rhinitis    Past Surgical History  Procedure Date  . Colectomy 1990  . Mitral valve annuloplasty     [facility] 2006    Review of Systems  Cardiovascular: Negative for chest pain, palpitations and leg swelling.  Musculoskeletal: Negative for arthralgias.   Objective:   Physical Exam  Constitutional: He appears well-developed and well-nourished.  Cardiovascular: Normal rate, regular rhythm and normal heart sounds.   Pulmonary/Chest: Effort normal and breath sounds normal.   Assessment & Plan:

## 2010-05-20 NOTE — Assessment & Plan Note (Addendum)
Recently he held simvastatin due to shoulder pain. Pain is better. Patient reports that his cardiovascular surgeon likes his cholesterol to be very well controlled. Plan: Start Pravachol, see instruction. Patient will call if side effects resurface

## 2010-05-20 NOTE — Patient Instructions (Signed)
Make an appointment for labs only: FLP AST ALT dx hyperlipidemia in 2 months

## 2010-05-23 NOTE — Assessment & Plan Note (Signed)
Better after statins held

## 2010-05-26 NOTE — Assessment & Plan Note (Signed)
Better after statins were held

## 2010-05-26 NOTE — Progress Notes (Signed)
  Subjective:    Patient ID: Kevin Bass, male    DOB: [birth year], 70 y.o.   MRN: [id number]  HPI    Review of Systems     Objective:   Physical Exam        Assessment & Plan:

## 2010-06-15 ENCOUNTER — Telehealth: Payer: Self-pay | Admitting: *Deleted

## 2010-06-16 NOTE — Telephone Encounter (Signed)
Message left for patient to return my call.  

## 2010-06-16 NOTE — Telephone Encounter (Signed)
See last office visit notes, I recommended Pravachol 40 mg one by mouth daily. He got a prescription. Has he started Pravachol?

## 2010-06-17 NOTE — Telephone Encounter (Signed)
I spoke w/ pt and he states that he started the Pravachol and this caused muscle pains as well.

## 2010-06-18 MED ORDER — NIACIN ER (ANTIHYPERLIPIDEMIC) 500 MG PO TBCR: EXTENDED_RELEASE_TABLET | ORAL | Status: DC

## 2010-06-18 NOTE — Telephone Encounter (Signed)
I spoke w/ pt he is aware. We place 1 week worth of samples up front for pt to use and rx.

## 2010-06-18 NOTE — Telephone Encounter (Signed)
D/c all statins We can try niaspan 500mg  1 po qhs x 1 week, then 2 po qhs. Give samples, give him the booklet with instructions and s/e (flushing), there is a 1-800 number he can call to get more detail instruction

## 2010-06-18 NOTE — Telephone Encounter (Signed)
Addended by: Army Fossa on: 06/18/2010 01:21 PM   Modules accepted: Orders

## 2010-07-10 NOTE — Cardiovascular Report (Signed)
NAME:  Kevin Bass, DUCEY NO.:  [id number]   MEDICAL RECORD NO.:  [id number]          PATIENT TYPE:  OIB   LOCATION:  6501                         FACILITY:  [facility]   PHYSICIAN:  Charlies Constable, M.D. [facility] DATE OF BIRTH:  October 22, 1940   DATE OF PROCEDURE:  05/19/2004  DATE OF DISCHARGE:                              CARDIAC CATHETERIZATION   PROCEDURE:  Left and right heart catheterization   CLINICAL HISTORY:  Mr. 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 He has  known valvular heart disease and has undergone a transesophageal  echocardiogram which has shown flair of posterior leaflet with a ruptured  chorda and severe mitral regurgitation. His left ventricular function has  been preserved. He is brought in today for catheterization and anticipation  of considering mitral valve repair.   PROCEDURE NOTE:  The procedure was performed via the right femoral artery  using arterial sheath and 4-French preformed coronary catheters. __________  arterial puncture was performed and Omnipaque contrast was used.  Right  heart catheterization was performed percutaneously in the right femoral vein  using a venous sheath and Swan-Ganz slow motion catheter.  An aortic root  was evaluated for aortic insufficiency. The patient tolerated the procedure  well and left the laboratory in satisfactory condition.   RESULTS:   HEMODYNAMICS:  The right atrial pressure was 3 mean. The pulmonary artery  pressure was 26/5 and mean of 16.  The pulmonary wedge pressure was 9 and  increased to 13 by the end of the procedure with V-wedge of 20. Left  ventricular pressure was 119/8, aortic pressure was 119/72 with a mean of  74.  Cardiac output/cardiac index was 5.7/2.9 liters/minute/m2/thick.   ANGIOGRAPHIC DATA:  LEFT MAIN CORONARY ARTERY: The left main coronary artery  is free of significant disease.  LEFT ANTERIOR  DESCENDING: The left anterior descending artery gives rise to  a large diagonal branch and a large septal perforator and a second septal  perforator and smaller diagonal branch. There were irregularities in the  LAD, but no significant obstruction.  The circumflex artery __________ gave rise to a ramus branch, an atrial  branch, a marginal branch, and a posterolateral branch. These vessels are  free of significant disease.   The right coronary artery is a moderately large vessel and gave rise to a  conus branch, right ventricular branch, posterior descending branch, and two  posterolateral branches. These vessels are free of significant disease.   The left ventriculogram was performed in the RAO projection and showed  overall good wall motion with an estimated ejection fraction of 55%  to 60%.  There was moderately severe 3+ mitral regurgitation. We were unable to tell  about feeling of the pulmonary veins.   CONCLUSION:  1.  Moderately severe mitral regurgitation.  2.  Normal coronary angiography.  3.  Good left ventricular function .   RECOMMENDATIONS:  I discussed the findings with Dr. Myrtis Ser. He will discussed  further with the patient regarding exploring the possibility of mitral valve  repair.      BB/MEDQ  D:  05/19/2004  T:  05/19/2004  Job:  [id number]   cc:   Willa Rough, M.D.   Wanda Plump, MD [facility]  [phone] [place], [place] [address]   Learta Codding, M.D. [facility]

## 2010-07-23 ENCOUNTER — Other Ambulatory Visit: Payer: Self-pay | Admitting: Internal Medicine

## 2010-07-24 ENCOUNTER — Other Ambulatory Visit (INDEPENDENT_AMBULATORY_CARE_PROVIDER_SITE_OTHER): Payer: Medicare Other

## 2010-07-24 DIAGNOSIS — E785 Hyperlipidemia, unspecified: Secondary | ICD-10-CM

## 2010-07-24 LAB — LDL CHOLESTEROL, DIRECT: Direct LDL: 171.3 mg/dL

## 2010-07-24 LAB — LIPID PANEL
Total CHOL/HDL Ratio: 4
VLDL: 12.4 mg/dL (ref 0.0–40.0)

## 2010-07-24 LAB — ALT: ALT: 19 U/L (ref 0–53)

## 2010-07-27 ENCOUNTER — Telehealth: Payer: Self-pay | Admitting: *Deleted

## 2010-07-27 NOTE — Telephone Encounter (Signed)
Message copied by Leanne Lovely on Mon Jul 27, 2010  4:46 PM ------      Message from: Willow Ora E      Created: Mon Jul 27, 2010  4:43 PM       We recommended Niaspan few weeks ago. Please ask about compliance.      Cholesterol continued to be elevated.      He has been intolerant of simvastatin and Pravachol.      Plan:      Start Crestor 5 mg one daily. Provide a Rx and  Samples (okay to give 10 mg tablets and take half tablet at bedtime)      Follow up around 09-2010 for a routine office visit, fasting.

## 2010-07-27 NOTE — Telephone Encounter (Signed)
Pt states he is taking the Niaspan, he is concerned to take the Crestor due to getting the same pains he has experienced in the past.

## 2010-07-28 NOTE — Telephone Encounter (Signed)
Continue with Niaspan. I know he has aches and pains with statins but we could try a very low dose of Crestor (5mg  qd) and see how he reacts. If he agrees, give him samples . Another option would be referral to the cholesterol clinic. Please arrange if patient so desire

## 2010-07-28 NOTE — Telephone Encounter (Signed)
Noted  

## 2010-07-28 NOTE — Telephone Encounter (Signed)
Pt declines Crestor and Cholesterol clinic, he would like to continue w/ Niaspan and work on a healthier diet. He states he will come back in 6 weeks to have labs checked if still elevated he will go on Crestor then.

## 2010-10-30 ENCOUNTER — Ambulatory Visit (INDEPENDENT_AMBULATORY_CARE_PROVIDER_SITE_OTHER): Payer: Medicare Other | Admitting: Internal Medicine

## 2010-10-30 ENCOUNTER — Encounter: Payer: Self-pay | Admitting: Internal Medicine

## 2010-10-30 ENCOUNTER — Ambulatory Visit: Payer: Medicare Other | Admitting: Internal Medicine

## 2010-10-30 VITALS — BP 112/64 | HR 79 | Temp 98.1°F | Resp 14 | Wt 176.4 lb

## 2010-10-30 DIAGNOSIS — E785 Hyperlipidemia, unspecified: Secondary | ICD-10-CM

## 2010-10-30 LAB — LIPID PANEL
Cholesterol: 210 mg/dL — ABNORMAL HIGH (ref 0–200)
Total CHOL/HDL Ratio: 4
VLDL: 17.8 mg/dL (ref 0.0–40.0)

## 2010-10-30 NOTE — Progress Notes (Signed)
  Subjective:    Patient ID: Kevin Bass, male    DOB: 06/24/1940, 70 y.o.   MRN: [id number]  HPI Routine office visit, doing well.  Past Medical History  Diagnosis Date  . Colon cancer     s/p partial colectomy 1990, Cscope neg 7-09, next 2014  . DISORDER, MITRAL VALVE 06/15/2006    still needs ABX prophylaxis   . HYPERLIPIDEMIA 06/15/2006  . HYPERTROPHY PROSTATE W/O UR OBST & OTH LUTS 10/16/2008  . HYPERGLYCEMIA, BORDERLINE 10/16/2008  . Allergic rhinitis    Past Surgical History  Procedure Date  . Colectomy 1990  . Mitral valve annuloplasty     [facility] 2006    Review of Systems Since the last office visit, he discontinue simvastatin, shoulder pain went away. He tried Pravachol briefly, the pain came back and consequently he stopped statins. He was recommended Niaspan but he couldn't afford it. He took over-the-counter niacin briefly but later on switch to red rice yeast. His diet continue to be healthy for the most part. He continued to be active although he recognized this that used to be more active before    Objective:   Physical Exam  Constitutional: He is oriented to person, place, and time. He appears well-developed and well-nourished. No distress.  HENT:  Head: Normocephalic and atraumatic.  Cardiovascular: Normal rate, regular rhythm and normal heart sounds.   No murmur heard. Pulmonary/Chest: Breath sounds normal. No respiratory distress. He has no wheezes. He has no rales.  Musculoskeletal: He exhibits no edema.  Neurological: He is alert and oriented to person, place, and time.  Skin: He is not diaphoretic.  Psychiatric: He has a normal mood and affect. His behavior is normal. Judgment and thought content normal.          Assessment & Plan:

## 2010-10-30 NOTE — Assessment & Plan Note (Addendum)
See history of present illness Intolerant to simvastatin and Pravachol due to shoulder pain. niaspan  was very expensive Currently on red rice yeast. I talked to him about other  alternatives like a low dose of Crestor or livalo (pt not enthusiastic about it) Plan: Labs

## 2011-01-20 ENCOUNTER — Encounter: Payer: Self-pay | Admitting: Internal Medicine

## 2011-01-22 ENCOUNTER — Encounter: Payer: Self-pay | Admitting: Internal Medicine

## 2011-01-22 ENCOUNTER — Ambulatory Visit (INDEPENDENT_AMBULATORY_CARE_PROVIDER_SITE_OTHER): Payer: Medicare Other | Admitting: Internal Medicine

## 2011-01-22 VITALS — BP 128/90 | Temp 98.6°F | Wt 179.6 lb

## 2011-01-22 DIAGNOSIS — E785 Hyperlipidemia, unspecified: Secondary | ICD-10-CM

## 2011-01-22 DIAGNOSIS — I10 Essential (primary) hypertension: Secondary | ICD-10-CM | POA: Insufficient documentation

## 2011-01-22 NOTE — Patient Instructions (Signed)
Our BP goal is 120/80 Check the  blood pressure daily, be sure it is less than 140/85. If it is consistently higher, let me know Came back in 1 month fasting Call or go to the ER if chest pain, difficulty breathing, headache, worsening imbalance or if you are not feeling better in 10 days

## 2011-01-22 NOTE — Assessment & Plan Note (Signed)
Re asses in 1 month

## 2011-01-22 NOTE — Assessment & Plan Note (Addendum)
Recent BP elavation in the setting of a recent trip with increase Na+ intake. Some "imbalance" w/ a normal neuro exam. Recent airplane trip but no sx to suggest PE. EKG w/o acute changes Plan:  discussed BP meds, Pt reluctant to take BP meds , states will try a natural OTC (hawthorn) thus will rec low salt diet, stay active and reasses in 1month

## 2011-01-22 NOTE — Progress Notes (Signed)
  Subjective:    Patient ID: Kevin Bass, male    DOB: 10-05-1940, 70 y.o.   MRN: [id number]  HPI In general he is feeling well but last week he noticed his blood pressure to be 160/90 which is unusually high for him. At the same time he felt "imbalance", it is hard for him to describe this better but was not spinning, he just felt "a little off" mostly when walking but not when he stood up fast. One night last week his BP went up to 200/100. The next morning he went to urgent care, his BP was rechecked, it was 140/ 80. They prescribed clonidine 0.1 mg when necessary BP more than 140s. Since then he has monitored his blood pressure and it remains less than 140/80. The imbalance is almost gone.  Past Medical History  Diagnosis Date  . Colon cancer     s/p partial colectomy 1990, Cscope neg 7-09, next 2014  . DISORDER, MITRAL VALVE     MV recontruction at [facility] 2006----still needs ABX for SBE prophylaxis   . HYPERLIPIDEMIA 06/15/2006  . HYPERTROPHY PROSTATE W/O UR OBST & OTH LUTS 10/16/2008  . HYPERGLYCEMIA, BORDERLINE 10/16/2008  . Allergic rhinitis    Past Surgical History  Procedure Date  . Colectomy 1990  . Mitral valve annuloplasty     [facility] 2006     Review of Systems He traveled to [place] and came back on November 15, he admits that he ate more salt than usual. Denies any chest pain, shortness of breath, palpitations. No lower extremity edema or calf pain. No recent intake of them states. Not taking any over-the-counter medicines other than what is described in his medication list. Denies any slurred speech, facial paralysis or motor deficits.     Objective:   Physical Exam  Constitutional: He is oriented to person, place, and time. He appears well-developed and well-nourished.  HENT:  Head: Normocephalic and atraumatic.  Neck: No thyromegaly present.       Normal carotid pulses  Cardiovascular: Normal rate, regular rhythm and normal heart sounds.   No  murmur heard. Pulmonary/Chest: Breath sounds normal. No respiratory distress. He has no wheezes. He has no rales.  Abdominal: Soft. Bowel sounds are normal. He exhibits no distension. There is no tenderness. There is no rebound and no guarding.  Musculoskeletal: He exhibits no edema.       Calves symmetric, non tender   Neurological: He is alert and oriented to person, place, and time.       Speech, gait and motor are intact. DTRs slightly decreased in the upper extremities but symmetric, normal at the lower extremities. Romberg absent.  Psychiatric: He has a normal mood and affect. His behavior is normal. Judgment and thought content normal.       Assessment & Plan:  Today , I spent more than 25 min with the patient

## 2011-03-11 ENCOUNTER — Telehealth: Payer: Self-pay | Admitting: Internal Medicine

## 2011-03-11 NOTE — Telephone Encounter (Signed)
Please check on the patient, was seen with elevated BP 6 weeks ago, BP better?

## 2011-03-12 NOTE — Telephone Encounter (Signed)
Pt states bp readings have been stable.  Pt advised to call office if bp readings are elevated.

## 2011-11-26 ENCOUNTER — Encounter: Payer: Self-pay | Admitting: Family Medicine

## 2011-11-26 ENCOUNTER — Ambulatory Visit (INDEPENDENT_AMBULATORY_CARE_PROVIDER_SITE_OTHER): Payer: Medicare Other | Admitting: Family Medicine

## 2011-11-26 VITALS — BP 102/68 | HR 85 | Temp 98.2°F | Resp 16 | Wt 176.0 lb

## 2011-11-26 DIAGNOSIS — J069 Acute upper respiratory infection, unspecified: Secondary | ICD-10-CM

## 2011-11-26 MED ORDER — BENZONATATE 200 MG PO CAPS: 200.0000 mg | ORAL_CAPSULE | Freq: Three times a day (TID) | ORAL | Status: DC | PRN

## 2011-11-26 NOTE — Progress Notes (Signed)
  Subjective:    Patient ID: Kevin Bass, male    DOB: [birth year], 71 y.o.   MRN: [id number]  HPI [name]- sxs started 1 week ago after flying back from [place].  Tm 100 over the weekend.  Still w/ persistent chest congestion, cough- productive of white sputum.  + PND.  Mild HA.  No facial pain.  No ear pain.  + sick contacts.   Review of Systems For ROS see HPI     Objective:   Physical Exam  Constitutional: He appears well-developed and well-nourished. No distress.  HENT:  Head: Normocephalic and atraumatic.       No TTP over sinuses + turbinate edema + PND TMs normal bilaterally  Eyes: Conjunctivae normal and EOM are normal. Pupils are equal, round, and reactive to light.  Neck: Normal range of motion. Neck supple.  Cardiovascular: Normal rate, regular rhythm and normal heart sounds.   Pulmonary/Chest: Effort normal and breath sounds normal. No respiratory distress. He has no wheezes. He has no rales.  Lymphadenopathy:    He has no cervical adenopathy.  Skin: Skin is warm and dry.          Assessment & Plan:

## 2011-11-26 NOTE — Patient Instructions (Addendum)
This appears to be a virus that you caught while traveling Use the cough pills as needed Add Mucinex to thin your congestion Drink plenty of fluids REST! Hang in there!!!

## 2011-11-27 DIAGNOSIS — J069 Acute upper respiratory infection, unspecified: Secondary | ICD-10-CM | POA: Insufficient documentation

## 2011-11-27 NOTE — Assessment & Plan Note (Signed)
New.  No evidence of bacterial infxn.  No abx needed.  Cough meds prn.  Reviewed supportive care and red flags that should prompt return.  Pt expressed understanding and is in agreement w/ plan.

## 2012-02-23 HISTORY — PX: HERNIA REPAIR: SHX51

## 2012-02-25 ENCOUNTER — Ambulatory Visit (INDEPENDENT_AMBULATORY_CARE_PROVIDER_SITE_OTHER): Payer: Medicare Other | Admitting: Family Medicine

## 2012-02-25 ENCOUNTER — Encounter: Payer: Self-pay | Admitting: Family Medicine

## 2012-02-25 VITALS — BP 120/70 | HR 72 | Temp 98.1°F | Ht 68.75 in | Wt 176.6 lb

## 2012-02-25 DIAGNOSIS — K4090 Unilateral inguinal hernia, without obstruction or gangrene, not specified as recurrent: Secondary | ICD-10-CM | POA: Insufficient documentation

## 2012-02-25 NOTE — Assessment & Plan Note (Signed)
New.  Pt has had sxs x2 yrs, nothing acute today.  Reviewed red flags that should prompt immediate evaluation.  Refer to surgery for evaluation and tx.

## 2012-02-25 NOTE — Patient Instructions (Addendum)
This appears to be an inguinal hernia We'll call you with your surgery appt If you have severe pain- lie down and rest.  If the pain doesn't improve, go to the ER.  Rarely, the bowel can get stuck and that is an emergency Call with any questions or concerns Hang in there! Happy New Year!

## 2012-02-25 NOTE — Progress Notes (Signed)
  Subjective:    Patient ID: Kevin Bass, male    DOB: 1940/11/04, 72 y.o.   MRN: [id number]  HPI Groin pain- L sided, has been more active recently walking a [employer], when the dog pulls, he would feel a burning in the groin.  Has had similar sxs w/ strenuous activity over the last few years.  Does not report a bulge.  Pain will improve w/ resting for a few days.  Pain will 'on occasion' radiate into testicle.  No testicular swelling.  No definitive injury or hx of popping w/ heavy lifting.   Review of Systems For ROS see HPI     Objective:   Physical Exam  Vitals reviewed. Constitutional: He appears well-developed and well-nourished. No distress.  Abdominal: Soft. Bowel sounds are normal.       No obvious bulge in L inguinal area but pt w/ palpable muscle defect consistent w/ hernia.          Assessment & Plan:

## 2012-03-02 ENCOUNTER — Ambulatory Visit (INDEPENDENT_AMBULATORY_CARE_PROVIDER_SITE_OTHER): Payer: Medicare Other | Admitting: General Surgery

## 2012-03-02 ENCOUNTER — Encounter (INDEPENDENT_AMBULATORY_CARE_PROVIDER_SITE_OTHER): Payer: Self-pay | Admitting: General Surgery

## 2012-03-02 ENCOUNTER — Telehealth (INDEPENDENT_AMBULATORY_CARE_PROVIDER_SITE_OTHER): Payer: Self-pay | Admitting: General Surgery

## 2012-03-02 VITALS — BP 126/76 | HR 72 | Resp 16 | Ht 69.0 in | Wt 178.0 lb

## 2012-03-02 DIAGNOSIS — K429 Umbilical hernia without obstruction or gangrene: Secondary | ICD-10-CM

## 2012-03-02 DIAGNOSIS — K4090 Unilateral inguinal hernia, without obstruction or gangrene, not specified as recurrent: Secondary | ICD-10-CM

## 2012-03-02 NOTE — Progress Notes (Signed)
Patient ID: Kevin Bass, male   DOB: March 08, 1940, 72 y.o.   MRN: [id number]  Chief Complaint  Patient presents with  . Inguinal Hernia    left    HPI Kevin Bass is a 72 y.o. male.   HPI 72 yo male referred by Dr Beverely Low for evaluation of left inguinal hernia. The patient states that he has had some intermittent discomfort in his left groin for about a year or 2 however over the past couple weeks he has had more frequent episodes of discomfort in his left groin. It tends to bother him more if he has been standing on his feet for several hours or at the end of the day. He said generally the discomfort is not that bad but occasionally he will have some burning in his left groin. It will generally last all evening however by the morning it has resolved. He has also noticed a small bulge in his left groin. He states that on occasion he has had some right groin discomfort but not near as bad as his left side. He denies any nausea, vomiting, diarrhea or constipation. He denies any prior groin surgery. He has had a prior open colectomy for colon cancer in 1990. He does have some difficulty urinating due to his enlarged prostate and he is taking medication for it.  Past Medical History  Diagnosis Date  . Colon cancer     s/p partial colectomy 1990, Cscope neg 7-09, next 2014  . DISORDER, MITRAL VALVE     MV recontruction at [facility] 2006----still needs ABX for SBE prophylaxis   . HYPERLIPIDEMIA 06/15/2006  . HYPERTROPHY PROSTATE W/O UR OBST & OTH LUTS 10/16/2008  . HYPERGLYCEMIA, BORDERLINE 10/16/2008  . Allergic rhinitis     Past Surgical History  Procedure Date  . Colectomy 1990  . Mitral valve annuloplasty     [facility] 2006    Family History  Problem Relation Age of Onset  . Colon cancer      M side (cousins)  . Diabetes Neg Hx   . Stroke Neg Hx   . Cancer Other     colon...maternal side    Social History History  Substance Use Topics  . Smoking status: Former Smoker    Quit  date: 02/23/1983  . Smokeless tobacco: Not on file  . Alcohol Use: No    No Known Allergies  Current Outpatient Prescriptions  Medication Sig Dispense Refill  . ARGININE PO Take 3 each by mouth daily.        Marland Kitchen aspirin 81 MG tablet Take 81 mg by mouth daily.        Marland Kitchen doxazosin (CARDURA) 2 MG tablet Take 2 mg by mouth at bedtime.        . fish oil-omega-3 fatty acids 1000 MG capsule Take 1 g by mouth daily.        . Red Yeast Rice Extract (RED YEAST RICE PO) Take 2 each by mouth daily.          Review of Systems Review of Systems  Constitutional: Negative for fever, chills, appetite change and unexpected weight change.  HENT: Negative for congestion and trouble swallowing.   Eyes: Negative for visual disturbance.  Respiratory: Negative for chest tightness and shortness of breath.   Cardiovascular: Negative for chest pain, palpitations and leg swelling.       No PND, no orthopnea, no DOE; MV repair at [facility] in 2007  Gastrointestinal:       See HPI  Genitourinary:  Negative for dysuria and hematuria.       +nocturia, some weak stream; takes cardura for BPH  Musculoskeletal: Negative.   Skin: Negative for rash.  Neurological: Negative for seizures and speech difficulty.       No TIAs  Hematological: Does not bruise/bleed easily.  Psychiatric/Behavioral: Negative for behavioral problems and confusion.    Blood pressure 126/76, pulse 72, resp. rate 16, height 5\' 9"  (1.753 m), weight 178 lb (80.74 kg).  Physical Exam Physical Exam  Vitals reviewed. Constitutional: He appears well-developed and well-nourished. No distress.  HENT:  Head: Normocephalic and atraumatic.  Right Ear: External ear normal.  Eyes: Conjunctivae normal are normal. No scleral icterus.  Neck: Normal range of motion. Neck supple. No tracheal deviation present. No thyromegaly present.  Cardiovascular: Normal rate, regular rhythm and normal heart sounds.   No murmur heard. Pulmonary/Chest: Effort normal and  breath sounds normal. No stridor. No respiratory distress. He has no wheezes.    Abdominal: Soft. Bowel sounds are normal. He exhibits no distension. There is no tenderness. There is no rebound. A hernia is present. Hernia confirmed positive in the ventral area (reducible; about 1.5cm) and confirmed positive in the left inguinal area (reducible). Hernia confirmed negative in the right inguinal area.    Genitourinary: Testes normal and penis normal.  Musculoskeletal: He exhibits no edema.  Neurological: He is alert. He exhibits normal muscle tone.  Skin: Skin is warm and dry. No rash noted. He is not diaphoretic. No erythema. No pallor.  Psychiatric: He has a normal mood and affect. His behavior is normal. Judgment and thought content normal.    Data Reviewed Dr Rennis Golden note  Assessment    Left inguinal hernia Umbilical incisional hernia    Plan    We discussed the etiology of inguinal hernias. We discussed the signs & symptoms of incarceration & strangulation.  We discussed non-operative and operative management. We discussed both open and laparoscopic repairs as well as the pros and cons of each approach. I discussed my experience with both types of repairs. I explained that with a laparoscopic repair we could repair the umbilical hernia at the same time since we will need to go thru the umbilicus to repair his groin hernia laparoscopically.   I described the procedure in detail.  The patient was given educational material. We discussed the risks and benefits including but not limited to bleeding, infection, chronic inguinal pain, nerve entrapment, hernia recurrence, mesh complications, hematoma formation, urinary retention, injury to the testicles, numbness in the groin, blood clots, injury to the surrounding structures, and anesthesia risk. We also discussed the typical post operative recovery course, including no heavy lifting for 4-6 weeks. I explained that the likelihood of  improvement of their symptoms is good. I did explain that there is chance that we might have to convert to an open procedure due to potential scar tissue from his prior colectomy. I also explained that he was a little bit higher risk for urinary retention given his current prostate history  The patient has elected to proceed with a laparoscopic repair of his left inguinal hernia with mesh as well as open umbilical hernia repair with possible mesh. He would like to go ahead and get surgery scheduled. We will tentatively have the patient scheduled for hernia repair at the surgical Center [facility] M. Andrey Campanile, MD, FACS General, Bariatric, & Minimally Invasive Surgery [facility], [place]           [facility] M 03/02/2012,  10:34 AM

## 2012-03-02 NOTE — Patient Instructions (Signed)
Hernia Repair with Laparoscope A hernia occurs when an internal organ pushes out through a weak spot in the belly (abdominal) wall muscles. Hernias most commonly occur in the groin and around the navel. Hernias can also occur through a cut by the surgeon (incision) after an abdominal operation. A hernia may be caused by:  Lifting heavy objects.  Prolonged coughing.  Straining to move your bowels. Hernias can often be pushed back into place (reduced). Most hernias tend to get worse over time. Problems occur when abdominal contents get stuck in the opening and the blood supply is blocked or impaired (incarcerated hernia). Because of these risks, you require surgery to repair the hernia. Your hernia will be repaired using a laparoscope. Laparoscopic surgery is a type of minimally invasive surgery. It does not involve making a typical surgical cut (incision) in the skin. A laparoscope is a telescope-like rod and lens system. It is usually connected to a video camera and a light source so your caregiver can clearly see the operative area. The instruments are inserted through  to  inch (5 mm or 10 mm) openings in the skin at specific locations. A working and viewing space is created by blowing a small amount of carbon dioxide gas into the abdominal cavity. The abdomen is essentially blown up like a balloon (insufflated). This elevates the abdominal wall above the internal organs like a dome. The carbon dioxide gas is common to the human body and can be absorbed by tissue and removed by the respiratory system. Once the repair is completed, the small incisions will be closed with either stitches (sutures) or staples (just like a paper stapler only this staple holds the skin together). LET YOUR CAREGIVERS KNOW ABOUT:  Allergies.  Medications taken including herbs, eye drops, over the counter medications, and creams.  Use of steroids (by mouth or creams).  Previous problems with anesthetics or  Novocaine.  Possibility of pregnancy, if this applies.  History of blood clots (thrombophlebitis).  History of bleeding or blood problems.  Previous surgery.  Other health problems. BEFORE THE PROCEDURE  Laparoscopy can be done either in a hospital or out-patient clinic. You may be given a mild sedative to help you relax before the procedure. Once in the operating room, you will be given a general anesthesia to make you sleep (unless you and your caregiver choose a different anesthetic).  AFTER THE PROCEDURE  After the procedure you will be watched in a recovery area. Depending on what type of hernia was repaired, you might be admitted to the hospital or you might go home the same day. With this procedure you may have less pain and scarring. This usually results in a quicker recovery and less risk of infection. HOME CARE INSTRUCTIONS   Bed rest is not required. You may continue your normal activities but avoid heavy lifting (more than 10 pounds) or straining.  Cough gently. If you are a smoker it is best to stop, as even the best hernia repair can break down with the continual strain of coughing.  Avoid driving until given the OK by your surgeon.  There are no dietary restrictions unless given otherwise.  TAKE ALL MEDICATIONS AS DIRECTED.  Only take over-the-counter or prescription medicines for pain, discomfort, or fever as directed by your caregiver. SEEK MEDICAL CARE IF:   There is increasing abdominal pain or pain in your incisions.  There is more bleeding from incisions, other than minimal spotting.  You feel light headed or faint.  You   develop an unexplained fever, chills, and/or an oral temperature above 102 F (38.9 C).  You have redness, swelling, or increasing pain in the wound.  Pus coming from wound.  A foul smell coming from the wound or dressings. SEEK IMMEDIATE MEDICAL CARE IF:   You develop a rash.  You have difficulty breathing.  You have any  allergic problems. MAKE SURE YOU:   Understand these instructions.  Will watch your condition.  Will get help right away if you are not doing well or get worse. Document Released: 02/08/2005 Document Revised: 05/03/2011 Document Reviewed: 01/08/2009 [employer] Patient Information 2013 [employer].  

## 2012-03-02 NOTE — Telephone Encounter (Signed)
Message copied by Liliana Cline on Thu Mar 02, 2012  2:20 PM ------      Message from: Rise Paganini      Created: Thu Mar 02, 2012  2:03 PM      Regarding: Andrey Campanile      Contact: [phone]       Patient stated that he has been scheduled for surgery and has some questions for Dr. Andrey Campanile regarding the surgery. Thank you.

## 2012-03-02 NOTE — Telephone Encounter (Signed)
Patient called to make sure Dr Andrey Campanile would be performing the surgery and to see if this is something he does on a regular basis. I made him aware that, yes, Dr Andrey Campanile would be his surgeon and he does this surgery pretty much on a weekly basis. He will call with any other questions.

## 2012-03-06 DIAGNOSIS — Z5331 Laparoscopic surgical procedure converted to open procedure: Secondary | ICD-10-CM

## 2012-03-06 DIAGNOSIS — K4090 Unilateral inguinal hernia, without obstruction or gangrene, not specified as recurrent: Secondary | ICD-10-CM

## 2012-03-07 ENCOUNTER — Telehealth (INDEPENDENT_AMBULATORY_CARE_PROVIDER_SITE_OTHER): Payer: Self-pay | Admitting: General Surgery

## 2012-03-07 ENCOUNTER — Telehealth (INDEPENDENT_AMBULATORY_CARE_PROVIDER_SITE_OTHER): Payer: Self-pay

## 2012-03-07 DIAGNOSIS — G8918 Other acute postprocedural pain: Secondary | ICD-10-CM

## 2012-03-07 MED ORDER — OXYCODONE-ACETAMINOPHEN 7.5-325 MG PO TABS: 1.0000 | ORAL_TABLET | ORAL | Status: DC | PRN

## 2012-03-07 NOTE — Telephone Encounter (Signed)
Called in rx to pt's pharmacy for pain med. Forgot to write rx postop yesterday

## 2012-03-07 NOTE — Telephone Encounter (Signed)
Patients wife called wanting to know if he could get percocet because the hydrocodone is not working. I paged Dr Andrey Campanile back and he okayed the refill and for one of his partners to write it for him.

## 2012-03-09 ENCOUNTER — Telehealth (INDEPENDENT_AMBULATORY_CARE_PROVIDER_SITE_OTHER): Payer: Self-pay | Admitting: General Surgery

## 2012-03-09 NOTE — Telephone Encounter (Signed)
Pt called to ask about both swelling and constipation.  He has been using ice packs to the site of his [facility] repair.  Asking if that area should feel "soft and squishy."  Reassured this is normal and is only soft-tissue swelling.  Advised sitting with feet elevated and elevate scrotum as well.  Also discussed constipation.  He is passing gas but no BM yet.  Advised walking more, drinking more (esp grape or prune juice,) taking Colace to soften the stool and try Miralax supplement.  Pt will call back prn.

## 2012-03-29 ENCOUNTER — Encounter (INDEPENDENT_AMBULATORY_CARE_PROVIDER_SITE_OTHER): Payer: Self-pay | Admitting: General Surgery

## 2012-03-29 ENCOUNTER — Ambulatory Visit (INDEPENDENT_AMBULATORY_CARE_PROVIDER_SITE_OTHER): Payer: Medicare Other | Admitting: General Surgery

## 2012-03-29 VITALS — BP 128/72 | HR 58 | Temp 97.6°F | Resp 16 | Ht 70.0 in | Wt 180.4 lb

## 2012-03-29 DIAGNOSIS — Z09 Encounter for follow-up examination after completed treatment for conditions other than malignant neoplasm: Secondary | ICD-10-CM

## 2012-03-29 NOTE — Progress Notes (Signed)
Subjective:     Patient ID: Kevin Bass, male   DOB: June 03, 1940, 72 y.o.   MRN: [id number]  HPI 72 year old Caucasian male comes in for followup after undergoing a laparoscopic converted to open left indirect inguinal hernia repair with mesh on January 13 at the [facility]. We were unable to do a laparoscopic repair due to extensive intra-abdominal adhesions. He states that he had some significant pain the first few days after surgery. However he stopped taking pain medication about 4 or 5 days after surgery. He denies any fevers, chills, nausea, vomiting, diarrhea or constipation. He denies any trouble urinating. He is still having some occasional shooting pains in his left groin going down his inner thigh. He denies any numbness or tingling in his groin.  Review of Systems     Objective:   Physical Exam BP 128/72  Pulse 58  Temp 97.6 F (36.4 C) (Temporal)  Resp 16  Ht 5\' 10"  (1.778 m)  Wt 180 lb 6.4 oz (81.829 kg)  BMI 25.88 kg/m2 Pleasant, no apparent distress Abdomen-soft, nontender, well-healed trocar incisions. No evidence of umbilical hernia GU- well-healed left inguinal incision. He has a little swelling underneath the incision. No scrotal or inguinal hematoma. Both testicles descended. No scrotal masses. No evidence of hernia recurrence    Assessment:     Status post laparoscopic converted to open repair of left indirect inguinal hernia with mesh January 13    Plan:     Overall I think he is doing well. We discussed the surgical findings. He was reminded he should not do any heavy lifting until after February 25. I advise just observation for intra-abdominal adhesions. We discussed signs and symptoms of intestinal obstruction. I explained that the intermittent discomfort on the left side should continue to improve with time. F/u 8 weeks  Mary Sella. Andrey Campanile, MD, FACS General, Bariatric, & Minimally Invasive Surgery [facility], [place]

## 2012-03-29 NOTE — Patient Instructions (Signed)
Can resume full activities the last week of February

## 2012-05-25 ENCOUNTER — Encounter (INDEPENDENT_AMBULATORY_CARE_PROVIDER_SITE_OTHER): Payer: Self-pay | Admitting: General Surgery

## 2012-05-25 ENCOUNTER — Ambulatory Visit (INDEPENDENT_AMBULATORY_CARE_PROVIDER_SITE_OTHER): Payer: Medicare Other | Admitting: General Surgery

## 2012-05-25 VITALS — BP 118/78 | HR 68 | Temp 98.4°F | Resp 16 | Ht 69.0 in | Wt 181.4 lb

## 2012-05-25 DIAGNOSIS — Z09 Encounter for follow-up examination after completed treatment for conditions other than malignant neoplasm: Secondary | ICD-10-CM

## 2012-05-25 NOTE — Progress Notes (Signed)
Subjective:     Patient ID: Kevin Bass, male   DOB: 04/29/1940, 72 y.o.   MRN: [id number]  HPI 72 year old [place] male comes in for followup after undergoing laparoscopic converted to open repair of left inguinal hernia with mesh on January 13. I last saw him in the office on February 5. At that time he was having some intermittent shooting pain into his groin. He states that the shooting pain has resolved. He denies any numbness or tingling in his groin. A swelling underneath his incision has resolved. He denies any fevers or chills nausea or vomiting. He denies any diarrhea or constipation. He has some occasional itching around his incision  Review of Systems     Objective:   Physical Exam BP 118/78  Pulse 68  Temp(Src) 98.4 F (36.9 C) (Temporal)  Resp 16  Ht 5\' 9"  (1.753 m)  Wt 181 lb 6.4 oz (82.283 kg)  BMI 26.78 kg/m2  Gen: alert, NAD, non-toxic appearing Abd: soft, nontender, nondistended. Well-healed trocar sites. No cellulitis. No incisional hernia GU: both testicles descended, no scrotal masses, no sign of hernia recurrence     Assessment:     Status post laparoscopic converted to open repair of left indirect inguinal hernia with mesh     Plan:     Overall, he is doing excellent. The sharp pain has resolved.  The incisional swelling has resolved. I have released him to full activities. However I did caution him to try to avoid lifting heavy objects if possible. I told him he could try the hydrocortisone ointment for the itching however I explained that it should improve with time. Followup as needed  Mary Sella. Andrey Campanile, MD, FACS General, Bariatric, & Minimally Invasive Surgery [facility], [place]

## 2012-05-25 NOTE — Patient Instructions (Signed)
Can resume full activities Can try hydrocortisone ointment for the itching

## 2012-06-29 ENCOUNTER — Encounter: Payer: Self-pay | Admitting: Gastroenterology

## 2012-07-28 ENCOUNTER — Encounter: Payer: Medicare Other | Admitting: Internal Medicine

## 2012-08-30 ENCOUNTER — Encounter: Payer: Self-pay | Admitting: Internal Medicine

## 2012-08-30 ENCOUNTER — Ambulatory Visit (INDEPENDENT_AMBULATORY_CARE_PROVIDER_SITE_OTHER): Payer: Medicare Other | Admitting: Internal Medicine

## 2012-08-30 VITALS — BP 145/88 | HR 71 | Temp 98.3°F | Ht 70.0 in | Wt 176.6 lb

## 2012-08-30 DIAGNOSIS — Z85038 Personal history of other malignant neoplasm of large intestine: Secondary | ICD-10-CM

## 2012-08-30 DIAGNOSIS — I059 Rheumatic mitral valve disease, unspecified: Secondary | ICD-10-CM

## 2012-08-30 DIAGNOSIS — E785 Hyperlipidemia, unspecified: Secondary | ICD-10-CM

## 2012-08-30 DIAGNOSIS — Z0000 Encounter for general adult medical examination without abnormal findings: Secondary | ICD-10-CM | POA: Insufficient documentation

## 2012-08-30 DIAGNOSIS — I10 Essential (primary) hypertension: Secondary | ICD-10-CM

## 2012-08-30 DIAGNOSIS — N400 Enlarged prostate without lower urinary tract symptoms: Secondary | ICD-10-CM

## 2012-08-30 DIAGNOSIS — R7309 Other abnormal glucose: Secondary | ICD-10-CM

## 2012-08-30 LAB — COMPREHENSIVE METABOLIC PANEL
Alkaline Phosphatase: 53 U/L (ref 39–117)
Glucose, Bld: 92 mg/dL (ref 70–99)
Sodium: 143 mEq/L (ref 135–145)
Total Bilirubin: 0.9 mg/dL (ref 0.3–1.2)
Total Protein: 7.4 g/dL (ref 6.0–8.3)

## 2012-08-30 LAB — CBC WITH DIFFERENTIAL/PLATELET
Basophils Absolute: 0 10*3/uL (ref 0.0–0.1)
Eosinophils Absolute: 0.1 10*3/uL (ref 0.0–0.7)
MCHC: 34.3 g/dL (ref 30.0–36.0)
MCV: 85.1 fl (ref 78.0–100.0)
Monocytes Absolute: 0.4 10*3/uL (ref 0.1–1.0)
Neutrophils Relative %: 63.6 % (ref 43.0–77.0)
Platelets: 229 10*3/uL (ref 150.0–400.0)
RDW: 13.3 % (ref 11.5–14.6)
WBC: 7.5 10*3/uL (ref 4.5–10.5)

## 2012-08-30 LAB — LDL CHOLESTEROL, DIRECT: Direct LDL: 149.1 mg/dL

## 2012-08-30 LAB — HEMOGLOBIN A1C: Hgb A1c MFr Bld: 5.8 % (ref 4.6–6.5)

## 2012-08-30 LAB — LIPID PANEL
Cholesterol: 214 mg/dL — ABNORMAL HIGH (ref 0–200)
HDL: 53.1 mg/dL (ref >39.00)
VLDL: 21.8 mg/dL (ref 0.0–40.0)

## 2012-08-30 NOTE — Assessment & Plan Note (Signed)
Status post mitral valve [facility]uloplasty[facility] in 2006, doing well. Last visit with [facility] early this year On aspirin daily

## 2012-08-30 NOTE — Assessment & Plan Note (Signed)
Due for labs

## 2012-08-30 NOTE — Patient Instructions (Signed)
Check the  blood pressure 2 or 3 times a week, be sure it is between 110/60 and 140/85. If it is consistently higher or lower, let me know   Fall Prevention and Home Safety Falls cause injuries and can affect all age groups. It is possible to use preventive measures to significantly decrease the likelihood of falls. There are many simple measures which can make your home safer and prevent falls. OUTDOORS  Repair cracks and edges of walkways and driveways.  Remove high doorway thresholds.  Trim shrubbery on the main path into your home.  Have good outside lighting.  Clear walkways of tools, rocks, debris, and clutter.  Check that handrails are not broken and are securely fastened. Both sides of steps should have handrails.  Have leaves, snow, and ice cleared regularly.  Use sand or salt on walkways during winter months.  In the garage, clean up grease or oil spills. BATHROOM  Install night lights.  Install grab bars by the toilet and in the tub and shower.  Use non-skid mats or decals in the tub or shower.  Place a plastic non-slip stool in the shower to sit on, if needed.  Keep floors dry and clean up all water on the floor immediately.  Remove soap buildup in the tub or shower on a regular basis.  Secure bath mats with non-slip, double-sided rug tape.  Remove throw rugs and tripping hazards from the floors. BEDROOMS  Install night lights.  Make sure a bedside light is easy to reach.  Do not use oversized bedding.  Keep a telephone by your bedside.  Have a firm chair with side arms to use for getting dressed.  Remove throw rugs and tripping hazards from the floor. KITCHEN  Keep handles on pots and pans turned toward the center of the stove. Use back burners when possible.  Clean up spills quickly and allow time for drying.  Avoid walking on wet floors.  Avoid hot utensils and knives.  Position shelves so they are not too high or low.  Place commonly  used objects within easy reach.  If necessary, use a sturdy step stool with a grab bar when reaching.  Keep electrical cables out of the way.  Do not use floor polish or wax that makes floors slippery. If you must use wax, use non-skid floor wax.  Remove throw rugs and tripping hazards from the floor. STAIRWAYS  Never leave objects on stairs.  Place handrails on both sides of stairways and use them. Fix any loose handrails. Make sure handrails on both sides of the stairways are as long as the stairs.  Check carpeting to make sure it is firmly attached along stairs. Make repairs to worn or loose carpet promptly.  Avoid placing throw rugs at the top or bottom of stairways, or properly secure the rug with carpet tape to prevent slippage. Get rid of throw rugs, if possible.  Have an electrician put in a light switch at the top and bottom of the stairs. OTHER FALL PREVENTION TIPS  Wear low-heel or rubber-soled shoes that are supportive and fit well. Wear closed toe shoes.  When using a stepladder, make sure it is fully opened and both spreaders are firmly locked. Do not climb a closed stepladder.  Add color or contrast paint or tape to grab bars and handrails in your home. Place contrasting color strips on first and last steps.  Learn and use mobility aids as needed. Install an electrical emergency response system.  Turn  Turn on lights to avoid dark areas. Replace light bulbs that burn out immediately. Get light switches that glow.  Arrange furniture to create clear pathways. Keep furniture in the same place.  Firmly attach carpet with non-skid or double-sided tape.  Eliminate uneven floor surfaces.  Select a carpet pattern that does not visually hide the edge of steps.  Be aware of all pets. OTHER HOME SAFETY TIPS  Set the water temperature for 120 F (48.8 C).  Keep emergency numbers on or near the telephone.  Keep smoke detectors on every level of the home and near sleeping  areas. Document Released: 01/29/2002 Document Revised: 08/10/2011 Document Reviewed: 04/30/2011 Exit[employer] Patient Information 2014 [employer].  

## 2012-08-30 NOTE — Assessment & Plan Note (Signed)
Td 2008 Pneumonia shot 2006, 2012 Zostavax-- had it per pt , no documentation  Cscope-- due for Cscope, see GI letter, pt aware PSAs per urology  Diet-exercise discussed

## 2012-08-30 NOTE — Assessment & Plan Note (Addendum)
On no medications, ambulatory BPs 130/90 at most. Plan: Recheck periodically, see instructions

## 2012-08-30 NOTE — Assessment & Plan Note (Signed)
Intolerant to statins, labs

## 2012-08-30 NOTE — Assessment & Plan Note (Signed)
Due for a colonoscopy, encouraged to call GI

## 2012-08-30 NOTE — Progress Notes (Signed)
Subjective:    Patient ID: Kevin Bass, male    DOB: 1940/09/25, 72 y.o.   MRN: [id number]  HPI Here for Medicare AWV:  1. Risk factors based on Past M, S, F history: reviewed 2. Physical Activities:  Takes walks, yard work 3. Depression/mood:  (-) screening  4. Hearing:  No problemss noted or reported  5. ADL's:  Independent  6. Fall Risk: no recent falls , see instructions  7. home Safety: does feel safe at home  8. Height, weight, &visual acuity: see VS, sees eye doctor regularly, wears glasses  9. Counseling: provided 10. Labs ordered based on risk factors: if needed  11. Referral Coordination: if needed 12.  Care Plan, see assessment and plan  13.   Cognitive Assessment: MS and motor skills normal  In addition, today we discussed the following: Elevated blood pressure, on no medications, ambulatory BPs always less than 130/90. BPH, sees urology routinely, they're doing the PSAs. Currently taking no medications and he is asymptomatic High cholesterol, on no medications except OTCs. Has a healthy lifestyle.  Past Medical History  Diagnosis Date  . Colon cancer     s/p partial colectomy 1990, Cscope neg 7-09, next 2014  . DISORDER, MITRAL VALVE     MV recontruction at [facility] 2006----still needs ABX for SBE prophylaxis   . HYPERLIPIDEMIA 06/15/2006  . HYPERTROPHY PROSTATE W/O UR OBST & OTH LUTS 10/16/2008  . HYPERGLYCEMIA, BORDERLINE 10/16/2008  . Allergic rhinitis    Past Surgical History  Procedure Laterality Date  . Colectomy  1990  . Mitral valve annuloplasty      [facility] 2006  . Hernia repair Left 2014    [facility] rep w/mesh   History   Social History  . Marital Status: Married    Spouse Name: N/A    Number of Children: 2  . Years of Education: N/A   Occupational History  . retired     Social History Main Topics  . Smoking status: Former Smoker    Quit date: 02/23/1983  . Smokeless tobacco: Never Used  . Alcohol Use: Yes     Comment: wine sometimes   . Drug  Use: No  . Sexually Active: Not on file   Other Topics Concern  . Not on file   Social History Narrative   From [place]         Family History  Problem Relation Age of Onset  . Colon cancer      M side (cousins)  . Diabetes Neg Hx   . Stroke Neg Hx   . CAD Neg Hx   . Prostate cancer Neg Hx      Review of Systems No chest pain or shortness or breath Denies nausea, vomiting, diarrhea or blood in the stools. No dysuria, gross hematuria    Objective:   Physical Exam  BP 145/88  Pulse 71  Temp(Src) 98.3 F (36.8 C) (Oral)  SpO2 97% General -- alert, well-developed, NAD .   Neck --no thyromegaly , normal carotid pulse  Lungs -- normal respiratory effort, no intercostal retractions, no accessory muscle use, and normal breath sounds.   Heart-- normal rate, regular rhythm, no murmur, and no gallop.   Abdomen--soft, non-tender, no distention, no masses, no HSM, no guarding, and no rigidity.   Extremities-- no pretibial edema bilaterally Neurologic-- alert & oriented X3 and strength normal in all extremities. Psych-- Cognition and judgment appear intact. Alert and cooperative with normal attention span and concentration.  not anxious appearing and  not depressed appearing.       Assessment & Plan:

## 2012-08-30 NOTE — Assessment & Plan Note (Addendum)
Last office visit with urology this year, currently asymptomatic, on no medications. Urology doing PSAs

## 2012-08-31 ENCOUNTER — Encounter: Payer: Self-pay | Admitting: Gastroenterology

## 2012-09-04 ENCOUNTER — Encounter: Payer: Self-pay | Admitting: *Deleted

## 2012-10-04 ENCOUNTER — Ambulatory Visit (AMBULATORY_SURGERY_CENTER): Payer: Medicare Other | Admitting: *Deleted

## 2012-10-04 ENCOUNTER — Encounter: Payer: Self-pay | Admitting: Gastroenterology

## 2012-10-04 VITALS — Ht 70.0 in | Wt 180.6 lb

## 2012-10-04 DIAGNOSIS — Z85038 Personal history of other malignant neoplasm of large intestine: Secondary | ICD-10-CM

## 2012-10-04 MED ORDER — PEG-KCL-NACL-NASULF-NA ASC-C 100 G PO SOLR: ORAL | Status: DC

## 2012-10-04 NOTE — Progress Notes (Signed)
No egg or soy allergy  Pt had to change his appt to 10-16-12 at 1:30 p.m.  Correct dates and times reviewed with him with his prep instructions

## 2012-10-10 ENCOUNTER — Telehealth: Payer: Self-pay | Admitting: *Deleted

## 2012-10-10 NOTE — Telephone Encounter (Signed)
Pt reports he cannot afford $90 for Movi Prep; left a voucher up front.

## 2012-10-16 ENCOUNTER — Encounter: Payer: Medicare Other | Admitting: Gastroenterology

## 2012-10-18 ENCOUNTER — Encounter: Payer: Medicare Other | Admitting: Gastroenterology

## 2012-10-18 ENCOUNTER — Encounter: Payer: Self-pay | Admitting: Gastroenterology

## 2012-10-18 ENCOUNTER — Ambulatory Visit (AMBULATORY_SURGERY_CENTER): Payer: Medicare Other | Admitting: Gastroenterology

## 2012-10-18 VITALS — BP 114/68 | HR 53 | Temp 97.6°F | Resp 18 | Ht 70.0 in | Wt 180.0 lb

## 2012-10-18 DIAGNOSIS — Z85038 Personal history of other malignant neoplasm of large intestine: Secondary | ICD-10-CM

## 2012-10-18 MED ORDER — SODIUM CHLORIDE 0.9 % IV SOLN: 500.0000 mL | INTRAVENOUS | Status: DC

## 2012-10-18 NOTE — Patient Instructions (Addendum)
YOU HAD AN ENDOSCOPIC PROCEDURE TODAY AT THE [facility]: Refer to the procedure report that was given to you for any specific questions about what was found during the examination.  If the procedure report does not answer your questions, please call your gastroenterologist to clarify.  If you requested that your care partner not be given the details of your procedure findings, then the procedure report has been included in a sealed envelope for you to review at your convenience later.  YOU SHOULD EXPECT: Some feelings of bloating in the abdomen. Passage of more gas than usual.  Walking can help get rid of the air that was put into your GI tract during the procedure and reduce the bloating. If you had a lower endoscopy (such as a colonoscopy or flexible sigmoidoscopy) you may notice spotting of blood in your stool or on the toilet paper. If you underwent a bowel prep for your procedure, then you may not have a normal bowel movement for a few days.  DIET: Your first meal following the procedure should be a light meal and then it is ok to progress to your normal diet.  A half-sandwich or bowl of soup is an example of a good first meal.  Heavy or fried foods are harder to digest and may make you feel nauseous or bloated.  Likewise meals heavy in dairy and vegetables can cause extra gas to form and this can also increase the bloating.  Drink plenty of fluids but you should avoid alcoholic beverages for 24 hours.  ACTIVITY: Your care partner should take you home directly after the procedure.  You should plan to take it easy, moving slowly for the rest of the day.  You can resume normal activity the day after the procedure however you should NOT DRIVE or use heavy machinery for 24 hours (because of the sedation medicines used during the test).    SYMPTOMS TO REPORT IMMEDIATELY: A gastroenterologist can be reached at any hour.  During normal business hours, 8:30 AM to 5:00 PM Monday through Friday,  call [phone].  After hours and on weekends, please call the GI answering service at [phone] who will take a message and have the physician on call contact you.   Following lower endoscopy (colonoscopy or flexible sigmoidoscopy):  Excessive amounts of blood in the stool  Significant tenderness or worsening of abdominal pains  Swelling of the abdomen that is new, acute  Fever of 100F or higher  FOLLOW UP: If any biopsies were taken you will be contacted by phone or by letter within the next 1-3 weeks.  Call your gastroenterologist if you have not heard about the biopsies in 3 weeks.  Our staff will call the home number listed on your records the next business day following your procedure to check on you and address any questions or concerns that you may have at that time regarding the information given to you following your procedure. This is a courtesy call and so if there is no answer at the home number and we have not heard from you through the emergency physician on call, we will assume that you have returned to your regular daily activities without incident.  SIGNATURES/CONFIDENTIALITY: You and/or your care partner have signed paperwork which will be entered into your electronic medical record.  These signatures attest to the fact that that the information above on your After Visit Summary has been reviewed and is understood.  Full responsibility of the confidentiality of this   discharge information lies with you and/or your care-partner.  Repeat colonoscopy in 5 years 

## 2012-10-18 NOTE — Op Note (Signed)
[facility] [address] [place], [address]   COLONOSCOPY PROCEDURE REPORT  PATIENT: Kevin Bass, Kevin Bass  MR#: [id number] BIRTHDATE: 1940-04-01 , 72  yrs. old GENDER: Male ENDOSCOPIST: Mardella Layman, MD, [facility] REFERRED BY: PROCEDURE DATE:  10/18/2012 PROCEDURE:   Colonoscopy, surveillance First Screening Colonoscopy - Avg.  risk and is 50 yrs.  old or older - No.  Prior Negative Screening - Now for repeat screening. N/A  History of Adenoma - Now for follow-up colonoscopy & has been > or = to 3 yrs.  N/A ASA CLASS:   Class III INDICATIONS:High risk patient with personal history of colon cancer.  MEDICATIONS: propofol (Diprivan) 100mg  IV  DESCRIPTION OF PROCEDURE:   After the risks benefits and alternatives of the procedure were thoroughly explained, informed consent was obtained.  A digital rectal exam revealed no abnormalities of the rectum.   The LB [id number]  endoscope was introduced through the anus and advanced to the cecum, which was identified by both the appendix and ileocecal valve. No adverse events experienced.   The quality of the prep was excellent, using MoviPrep  The instrument was then slowly withdrawn as the colon was fully examined.      COLON FINDINGS: There was evidence of a prior colo-colonic surgical anastomosis in the rectosigmoid colon.   The colon was otherwise normal.  There was no diverticulosis, inflammation, polyps or cancers unless previously stated.  Retroflexed views revealed internal hemorrhoids. The time to cecum=1 minutes 56 seconds. Withdrawal time=6 minutes 00 seconds.  The scope was withdrawn and the procedure completed. COMPLICATIONS: There were no complications.  ENDOSCOPIC IMPRESSION: 1.   There was evidence of a prior colo-colonic surgical anastomosis in the rectosigmoid colon 2.   The colon was otherwise normal  RECOMMENDATIONS: 1.  Repeat Colonoscopy in 5 years. 2.  Continue current  medications   eSigned:  Mardella Layman, MD, [facility] 10/18/2012 10:31 AM   cc: Willow Ora, MD   PATIENT NAME:  Kevin Bass, Kevin Bass MR#: [id number]

## 2012-10-19 ENCOUNTER — Telehealth: Payer: Self-pay | Admitting: *Deleted

## 2012-10-19 NOTE — Telephone Encounter (Signed)
  Follow up Call-  Call back number 10/18/2012  Post procedure Call Back phone  # [phone]  Permission to leave phone message Yes     Patient questions:  Do you have a fever, pain , or abdominal swelling? no Pain Score  0 *  Have you tolerated food without any problems? yes  Have you been able to return to your normal activities? yes  Do you have any questions about your discharge instructions: Diet   no Medications  no Follow up visit  no  Do you have questions or concerns about your Care? no  Actions: * If pain score is 4 or above: No action needed, pain <4.

## 2012-11-15 ENCOUNTER — Encounter: Payer: Self-pay | Admitting: Internal Medicine

## 2012-11-15 ENCOUNTER — Ambulatory Visit (INDEPENDENT_AMBULATORY_CARE_PROVIDER_SITE_OTHER): Payer: Medicare Other | Admitting: Internal Medicine

## 2012-11-15 VITALS — BP 135/82 | HR 75 | Temp 98.4°F | Wt 178.6 lb

## 2012-11-15 DIAGNOSIS — J309 Allergic rhinitis, unspecified: Secondary | ICD-10-CM

## 2012-11-15 MED ORDER — FLUTICASONE PROPIONATE 50 MCG/ACT NA SUSP: 2.0000 | Freq: Every day | NASAL | Status: DC

## 2012-11-15 NOTE — Progress Notes (Signed)
  Subjective:    Patient ID: Kevin Bass, male    DOB: 1940-03-03, 72 y.o.   MRN: [id number]  HPI Acute visit  3 months history of white postnasal dripping, wakes up every morning with a lot of mucus in the throat that he needs to clear. Thinks this is allergies, symptoms used to be a sporadic but now are more persistent.  Past Medical History  Diagnosis Date  . Colon cancer     s/p partial colectomy 1990, Cscope neg 7-09, next 2014  . DISORDER, MITRAL VALVE     MV recontruction at [facility] 2006----still needs ABX for SBE prophylaxis   . HYPERLIPIDEMIA 06/15/2006  . HYPERTROPHY PROSTATE W/O UR OBST & OTH LUTS 10/16/2008  . HYPERGLYCEMIA, BORDERLINE 10/16/2008  . Allergic rhinitis   . Heart murmur    Past Surgical History  Procedure Laterality Date  . Colectomy  1990  . Mitral valve annuloplasty      [facility] 2006  . Hernia repair Left 2014    [facility] rep w/mesh- March 2014     Review of Systems No fever or chills No sinus pain Occasional sneezing No actual cough Denies any itching in the eyes and nose. No exposure to new allergens.    Objective:   Physical Exam BP 135/82  Pulse 75  Temp(Src) 98.4 F (36.9 C)  Wt 178 lb 9.6 oz (81.012 kg)  BMI 25.63 kg/m2  SpO2 98%  General -- alert, well-developed, NAD.  HEENT--   TMs normal, throat symmetric, no redness or discharge. Face symmetric, sinuses not tender to palpation. Nose slt congested.  Lungs -- normal respiratory effort, no intercostal retractions, no accessory muscle use, and normal breath sounds.  Heart-- normal rate, regular rhythm, no murmur.   Psych-- Cognition and judgment appear intact. Cooperative with normal attention span and concentration. No anxious appearing , no depressed appearing.       Assessment & Plan:

## 2012-11-15 NOTE — Assessment & Plan Note (Signed)
Symptoms likely due to allergic rhinitis, see instructions

## 2012-11-15 NOTE — Patient Instructions (Signed)
Use Claritin OTC  and Flonase every day. If you are not improving in the next few weeks let me know. You can take both medications long term if so desired

## 2013-02-26 ENCOUNTER — Encounter: Payer: Self-pay | Admitting: Internal Medicine

## 2013-02-26 ENCOUNTER — Ambulatory Visit (INDEPENDENT_AMBULATORY_CARE_PROVIDER_SITE_OTHER): Payer: Medicare Other | Admitting: Internal Medicine

## 2013-02-26 VITALS — BP 126/82 | HR 80 | Temp 98.1°F | Wt 180.0 lb

## 2013-02-26 DIAGNOSIS — F329 Major depressive disorder, single episode, unspecified: Secondary | ICD-10-CM | POA: Insufficient documentation

## 2013-02-26 DIAGNOSIS — I10 Essential (primary) hypertension: Secondary | ICD-10-CM

## 2013-02-26 DIAGNOSIS — F32A Depression, unspecified: Secondary | ICD-10-CM | POA: Insufficient documentation

## 2013-02-26 DIAGNOSIS — F419 Anxiety disorder, unspecified: Secondary | ICD-10-CM

## 2013-02-26 DIAGNOSIS — F411 Generalized anxiety disorder: Secondary | ICD-10-CM

## 2013-02-26 MED ORDER — ALPRAZOLAM 0.25 MG PO TBDP: 0.2500 mg | ORAL_TABLET | Freq: Two times a day (BID) | ORAL | Status: DC | PRN

## 2013-02-26 NOTE — Progress Notes (Signed)
   Subjective:    Patient ID: Kevin Bass, male    DOB: 10/24/1940, 73 y.o.   MRN: [id number]  HPI ROV Today we discussed the following issues: Hypertension--last month BP was elevated for a few days at around 150/95, he has clonidin prescribed elsewhere and he takes it when necessary, took it for a few days and now is back to normal, ambulatory BPs recently 120/80. Also reports mild anxiety due to some family-personal issues, would like a prescription for Xanax which he has taken before without problems. See assessment and plan. Allergies--good compliance with medication, symptoms well-controlled Wonders about a flu shot--pros and cons discussed, will call if likes to proceed   Past Medical History  Diagnosis Date  . Colon cancer     s/p partial colectomy 1990, Cscope neg 7-09, next 2014  . DISORDER, MITRAL VALVE     MV recontruction at [place] 2006----still needs ABX for SBE prophylaxis   . HYPERLIPIDEMIA 06/15/2006  . HYPERTROPHY PROSTATE W/O UR OBST & OTH LUTS 10/16/2008  . HYPERGLYCEMIA, BORDERLINE 10/16/2008  . Allergic rhinitis   . Heart murmur    Past Surgical History  Procedure Laterality Date  . Colectomy  1990  . Mitral valve annuloplasty      [facility] 2006  . Hernia repair Left 2014    [facility] rep w/mesh- March 2014     Review of Systems Denies chest pain or shortness of breath No lower extremity edema. No depression Diet is healthy for the most part.     Objective:   Physical Exam BP 126/82  Pulse 80  Temp(Src) 98.1 F (36.7 C)  Wt 180 lb (81.647 kg)  SpO2 97% General -- alert, well-developed, NAD.   Lungs -- normal respiratory effort, no intercostal retractions, no accessory muscle use, and normal breath sounds.  Heart-- normal rate, regular rhythm, no murmur.  Extremities-- no pretibial edema bilaterally  Neurologic--  alert & oriented X3. Speech normal, gait normal, strength normal in all extremities.  Psych-- Cognition and judgment appear intact.  Cooperative with normal attention span and concentration. No anxious or depressed appearing.      Assessment & Plan:

## 2013-02-26 NOTE — Progress Notes (Signed)
Pre visit review using our clinic review tool, if applicable. No additional management support is needed unless otherwise documented below in the visit note. 

## 2013-02-26 NOTE — Assessment & Plan Note (Signed)
Recently BP was slightly elevated,  has clonidine rx elsewhere  which he takes as needed with good results and no apparent side effects.  Plan-- Low-salt diet, monitor BPs, okay to take Clonidine as needed only.

## 2013-02-26 NOTE — Assessment & Plan Note (Signed)
Mild anxiety, see history of present illness. Has taken Xanax from his wife with good results and no apparent side effects. Plan: low dose Xanax as needed

## 2013-02-26 NOTE — Patient Instructions (Signed)
Next visit is for a physical exam in 6 months  Please make an appointment    Check the  blood pressure weekly be sure it is between 110/60 and 140/85. Ideal blood pressure is 120/80. If it is consistently higher or lower, let me know Okay to take clonidine twice a day occasionally he is BPs more than 150/90

## 2013-03-01 ENCOUNTER — Encounter: Payer: Self-pay | Admitting: Internal Medicine

## 2013-03-02 MED ORDER — METOPROLOL TARTRATE 25 MG PO TABS: 12.5000 mg | ORAL_TABLET | Freq: Two times a day (BID) | ORAL | Status: DC

## 2013-03-02 NOTE — Telephone Encounter (Signed)
(  rx sent -metoprolol) / clonidine discontinued per dr [name] request.

## 2013-04-02 ENCOUNTER — Encounter: Payer: Self-pay | Admitting: Lab

## 2013-04-02 ENCOUNTER — Ambulatory Visit (INDEPENDENT_AMBULATORY_CARE_PROVIDER_SITE_OTHER): Payer: Medicare Other | Admitting: Internal Medicine

## 2013-04-02 ENCOUNTER — Encounter: Payer: Self-pay | Admitting: Internal Medicine

## 2013-04-02 VITALS — BP 121/79 | HR 66 | Temp 98.5°F | Wt 183.0 lb

## 2013-04-02 DIAGNOSIS — G519 Disorder of facial nerve, unspecified: Secondary | ICD-10-CM | POA: Insufficient documentation

## 2013-04-02 DIAGNOSIS — G510 Bell's palsy: Secondary | ICD-10-CM

## 2013-04-02 MED ORDER — GABAPENTIN 300 MG PO CAPS: 300.0000 mg | ORAL_CAPSULE | Freq: Two times a day (BID) | ORAL | Status: DC

## 2013-04-02 NOTE — Progress Notes (Signed)
   Subjective:    Patient ID: Kevin Bass, male    DOB: November 16, 1940, 73 y.o.   MRN: [id number]  DOS:  04/02/2013 Acute visit , For the last few months, every time he presses the area between his mouth and nose on the right side, he developed sharp pain that travels along the nose and the medial aspect aspect of the R forehead. There is no permanent or steady pain, no numbness anywhere in the face. Symptoms were not preceded by any rash. He went to see his dentist, had a crown 2 weeks ago but symptoms started before the  procedure. His dentist  is aware of the problem, he felt there was nothing inhe mouth that explains the  pain.   Past Medical History  Diagnosis Date  . Colon cancer     s/p partial colectomy 1990, Cscope neg 7-09, next 2014  . DISORDER, MITRAL VALVE     MV recontruction at [place] 2006----still needs ABX for SBE prophylaxis   . HYPERLIPIDEMIA 06/15/2006  . HYPERTROPHY PROSTATE W/O UR OBST & OTH LUTS 10/16/2008  . HYPERGLYCEMIA, BORDERLINE 10/16/2008  . Allergic rhinitis   . Heart murmur     Past Surgical History  Procedure Laterality Date  . Colectomy  1990  . Mitral valve annuloplasty      [facility] 2006  . Hernia repair Left 2014    [facility] rep w/mesh- March 2014    History   Social History  . Marital Status: Married    Spouse Name: N/A    Number of Children: 2  . Years of Education: N/A   Occupational History  . retired     Social History Main Topics  . Smoking status: Former Smoker    Quit date: 02/23/1983  . Smokeless tobacco: Never Used  . Alcohol Use: Yes     Comment: wine sometimes   . Drug Use: No  . Sexual Activity: Not on file   Other Topics Concern  . Not on file   Social History Narrative   From [place]           ROS denies any visual problems. No throat pain, neck swelling. No problems chewing his food, no headaches per se     Objective:   Physical Exam BP 121/79  Pulse 66  Temp(Src) 98.5 F (36.9 C)  Wt 183 lb (83.008 kg)   SpO2 99%  General -- alert, well-developed, NAD.  Neck --no thyromegaly , normal carotid pulse, no LAD HEENT-- Not pale. TMs normal, throat symmetric, no redness or discharge. Lips, teeth, gum normal to inspection and palpation Face symmetric, atraumatic , sinuses not tender to palpation. Nose not congested. Lungs -- normal respiratory effort, no intercostal retractions, no accessory muscle use, and normal breath sounds.  Heart-- normal rate, regular rhythm, no murmur.   Neurologic--  alert & oriented X3. Speech normal, gait normal, strength normal in all extremities. DTRs symmetric ; EOMI, PERLA   Psych-- Cognition and judgment appear intact. Cooperative with normal attention span and concentration. No anxious or depressed appearing.       Assessment & Plan:

## 2013-04-02 NOTE — Progress Notes (Signed)
Pre visit review using our clinic review tool, if applicable. No additional management support is needed unless otherwise documented below in the visit note. 

## 2013-04-02 NOTE — Assessment & Plan Note (Addendum)
New problem. Symptoms consistent with a facial neuropathy, neurological exam normal, mouth examination negative as well. Since symptoms are not debilitating at this time and exam is reassuring,I would recommend to check some basic labs including sed rate, folic acid, T26 level and start treatment with Neurontin if needed; if symptoms increase, he will need further eval by neurology.

## 2013-04-02 NOTE — Patient Instructions (Addendum)
Get your blood work before you leave  Start neurontin if the pain increases, start at night x 1 week, the 1 tab twice day Call if the pain is severe or increasing

## 2013-04-03 LAB — SEDIMENTATION RATE: SED RATE: 3 mm/h (ref 0–22)

## 2013-04-03 LAB — VITAMIN B12: Vitamin B-12: 519 pg/mL (ref 211–911)

## 2013-04-03 LAB — FOLATE: Folate: 16.2 ng/mL (ref >5.9)

## 2013-04-05 ENCOUNTER — Ambulatory Visit (INDEPENDENT_AMBULATORY_CARE_PROVIDER_SITE_OTHER): Payer: Medicare Other | Admitting: Family Medicine

## 2013-04-05 VITALS — BP 140/82 | HR 62 | Temp 98.2°F | Resp 16 | Ht 69.0 in | Wt 180.0 lb

## 2013-04-05 DIAGNOSIS — I10 Essential (primary) hypertension: Secondary | ICD-10-CM

## 2013-04-05 DIAGNOSIS — R42 Dizziness and giddiness: Secondary | ICD-10-CM

## 2013-04-05 NOTE — Progress Notes (Signed)
Subjective: Patient was recently placed on blood pressure medication by his primary care doctor. He's been watching his blood pressure comes closely, and continue taking the metoprolol 12.5 mg twice daily. He has a history of having had a mitral valve repair 9 years ago. He has done well from that. He gets some exercise walking. He is retired. No other major health issues. He was a little dizzy today when his blood pressure went up high. Apparently it was a little elevated this morning, 160/96, but later in the day as high as 170/100. He did take a alprazolam, but symptoms persisted. No chest pains. Labs were checked recently and he is PCP. They apparently were normal.  Objective No acute distress. Neck supple without nodes thyromegaly. No carotid bruits. Chest is clear to auscultation heart regular without murmurs gallops or arrhythmias. Patient is fully alert and oriented. I repeated the blood pressure and got 142/82.  Assessment: Hypertension Dizziness  Plan: Recommended he check his blood pressure as often as that may be driving anxiety. Try increasing the Toprol to 25 mg twice daily. See his primary care Dr. back soon in 2 or 3 weeks, or return to see me.

## 2013-04-05 NOTE — Patient Instructions (Signed)
Monitor your blood pressure about twice daily. Write down the readings. If it continues to run high greater than 160/95 get rechecked.  Monitor the pressure and if running over 140/90 go on up on the metoprolol to 25 mg twice daily and return to see your primary care doctor in 2 or 3 weeks or come back to see me.  If you develop chest pain or extreme dizziness go straight to the emergency room.

## 2013-04-11 ENCOUNTER — Telehealth: Payer: Self-pay

## 2013-04-11 NOTE — Telephone Encounter (Signed)
UDS: 04/03/2013 Negative for alprazolam and Gabapentin (PRN meds) Low risk per Dr [name]

## 2013-05-23 ENCOUNTER — Encounter: Payer: Self-pay | Admitting: Internal Medicine

## 2013-05-23 ENCOUNTER — Other Ambulatory Visit: Payer: Self-pay | Admitting: Internal Medicine

## 2013-05-23 MED ORDER — METOPROLOL TARTRATE 50 MG PO TABS: 25.0000 mg | ORAL_TABLET | Freq: Two times a day (BID) | ORAL | Status: DC

## 2013-05-28 ENCOUNTER — Encounter: Payer: Self-pay | Admitting: Internal Medicine

## 2013-05-31 ENCOUNTER — Other Ambulatory Visit: Payer: Self-pay | Admitting: Internal Medicine

## 2013-10-11 ENCOUNTER — Encounter: Payer: Self-pay | Admitting: Internal Medicine

## 2013-10-11 ENCOUNTER — Ambulatory Visit (INDEPENDENT_AMBULATORY_CARE_PROVIDER_SITE_OTHER): Payer: Medicare Other | Admitting: Internal Medicine

## 2013-10-11 VITALS — BP 124/68 | HR 70 | Temp 98.6°F | Wt 181.5 lb

## 2013-10-11 DIAGNOSIS — G519 Disorder of facial nerve, unspecified: Secondary | ICD-10-CM

## 2013-10-11 DIAGNOSIS — J069 Acute upper respiratory infection, unspecified: Secondary | ICD-10-CM

## 2013-10-11 DIAGNOSIS — G510 Bell's palsy: Secondary | ICD-10-CM

## 2013-10-11 MED ORDER — AMOXICILLIN 500 MG PO CAPS: 1000.0000 mg | ORAL_CAPSULE | Freq: Two times a day (BID) | ORAL | Status: DC

## 2013-10-11 NOTE — Progress Notes (Signed)
Subjective:    Patient ID: Kevin Bass, male    DOB: October 09, 1940, 73 y.o.   MRN: [id number]  DOS:  10/11/2013 Type of visit - description: acute History: Symptoms started 8 days ago with a dry throat, dry cough, now he is bringing up sputum, initially clear and now greenish in color. Wife had also a [name].   ROS Denies any fever or chills. No sinus pain, congestion or nasal discharge No nausea, vomiting, or diarrhea   Past Medical History  Diagnosis Date  . Colon cancer     s/p partial colectomy 1990, Cscope neg 7-09, next 2014  . DISORDER, MITRAL VALVE     MV recontruction at [place] 2006----still needs ABX for SBE prophylaxis   . HYPERLIPIDEMIA 06/15/2006  . HYPERTROPHY PROSTATE W/O UR OBST & OTH LUTS 10/16/2008  . HYPERGLYCEMIA, BORDERLINE 10/16/2008  . Allergic rhinitis   . Heart murmur     Past Surgical History  Procedure Laterality Date  . Colectomy  1990  . Mitral valve annuloplasty      [facility] 2006  . Hernia repair Left 2014    [facility] rep w/mesh- March 2014    History   Social History  . Marital Status: Married    Spouse Name: N/A    Number of Children: 2  . Years of Education: N/A   Occupational History  . retired     Social History Main Topics  . Smoking status: Former Smoker    Quit date: 02/23/1983  . Smokeless tobacco: Never Used  . Alcohol Use: Yes     Comment: wine sometimes   . Drug Use: No  . Sexual Activity: Not on file   Other Topics Concern  . Not on file   Social History Narrative   From [place]              Medication List       This list is accurate as of: 10/11/13  3:46 PM.  Always use your most recent med list.               ALPRAZolam 0.25 MG dissolvable tablet  Commonly known as:  NIRAVAM  Take 1-2 tablets (0.25-0.5 mg total) by mouth 2 (two) times daily as needed for anxiety.     amoxicillin 500 MG capsule  Commonly known as:  AMOXIL  Take 2 capsules (1,000 mg total) by mouth 2 (two) times daily.     ARGININE PO    Take 3 each by mouth daily.     aspirin 81 MG tablet  Take 81 mg by mouth daily.     FISH OIL PO  Take 1 tablet by mouth daily.     loratadine 10 MG tablet  Commonly known as:  CLARITIN  Take 10 mg by mouth daily as needed for allergies.     metoprolol tartrate 25 MG tablet  Commonly known as:  LOPRESSOR  take 1/2 tablet by mouth daily     NON FORMULARY  Blood pressure control supplement- takes 3 daily     OCUVITE PRESERVISION PO  Take 1 tablet by mouth daily.     PROSTATE HEALTH PO  Take 1 tablet by mouth daily.     RED YEAST RICE PO  Take 2 each by mouth daily.           Objective:   Physical Exam BP 124/68  Pulse 70  Temp(Src) 98.6 F (37 C) (Oral)  Wt 181 lb 8 oz (82.328 kg)  SpO2 97%  General -- alert, well-developed, NAD.   HEENT-- Not pale. TMs normal, throat symmetric, no redness or discharge. Face symmetric, sinuses not tender to palpation. Nose slt  congested.  Lungs -- normal respiratory effort, no intercostal retractions, no accessory muscle use, and normal breath sounds.  Heart-- normal rate, regular rhythm, no murmur.  Extremities-- no pretibial edema bilaterally  Neurologic--  alert & oriented X3. Speech normal, gait appropriate for age, strength symmetric and appropriate for age.  Psych-- Cognition and judgment appear intact. Cooperative with normal attention span and concentration. No anxious or depressed appearing.      Assessment & Plan:    [name], see instructions

## 2013-10-11 NOTE — Assessment & Plan Note (Signed)
Since her last office visit, symptoms resolve, patient discontinue gabapentin

## 2013-10-11 NOTE — Progress Notes (Signed)
Pre-visit discussion using our clinic review tool. No additional management support is needed unless otherwise documented below in the visit note.  

## 2013-10-11 NOTE — Patient Instructions (Addendum)
Rest, fluids , tylenol For cough, take Mucinex DM twice a day as needed  if congestion use OTC Nasocort: 2 nasal sprays on each side of the nose daily until you feel better   Take the antibiotic as prescribed  (Amoxicillin) if not better in 2-3 days  Call if no better in few days Call anytime if the symptoms are severe   Please make an appointment for a physical exam before you leave

## 2013-10-18 ENCOUNTER — Encounter: Payer: Self-pay | Admitting: Gastroenterology

## 2013-10-23 ENCOUNTER — Encounter: Payer: Self-pay | Admitting: Internal Medicine

## 2014-01-16 ENCOUNTER — Encounter: Payer: Self-pay | Admitting: Internal Medicine

## 2014-01-16 ENCOUNTER — Other Ambulatory Visit: Payer: Self-pay

## 2014-01-16 ENCOUNTER — Ambulatory Visit (INDEPENDENT_AMBULATORY_CARE_PROVIDER_SITE_OTHER): Payer: Medicare Other | Admitting: Internal Medicine

## 2014-01-16 VITALS — BP 132/76 | HR 59 | Temp 97.5°F | Ht 69.0 in | Wt 180.5 lb

## 2014-01-16 DIAGNOSIS — G510 Bell's palsy: Secondary | ICD-10-CM

## 2014-01-16 DIAGNOSIS — Z23 Encounter for immunization: Secondary | ICD-10-CM

## 2014-01-16 DIAGNOSIS — E785 Hyperlipidemia, unspecified: Secondary | ICD-10-CM

## 2014-01-16 DIAGNOSIS — R739 Hyperglycemia, unspecified: Secondary | ICD-10-CM

## 2014-01-16 DIAGNOSIS — G519 Disorder of facial nerve, unspecified: Secondary | ICD-10-CM

## 2014-01-16 DIAGNOSIS — Z0000 Encounter for general adult medical examination without abnormal findings: Secondary | ICD-10-CM

## 2014-01-16 DIAGNOSIS — F419 Anxiety disorder, unspecified: Secondary | ICD-10-CM

## 2014-01-16 DIAGNOSIS — I10 Essential (primary) hypertension: Secondary | ICD-10-CM

## 2014-01-16 LAB — LIPID PANEL
CHOL/HDL RATIO: 7
Cholesterol: 229 mg/dL — ABNORMAL HIGH (ref 0–200)
HDL: 35.1 mg/dL — AB (ref >39.00)
LDL CALC: 172 mg/dL — AB (ref 0–99)
NONHDL: 193.9
Triglycerides: 110 mg/dL (ref 0.0–149.0)
VLDL: 22 mg/dL (ref 0.0–40.0)

## 2014-01-16 MED ORDER — METOPROLOL TARTRATE 25 MG PO TABS: 12.5000 mg | ORAL_TABLET | Freq: Every day | ORAL | Status: DC

## 2014-01-16 MED ORDER — ALPRAZOLAM 0.25 MG PO TBDP: 0.2500 mg | ORAL_TABLET | Freq: Two times a day (BID) | ORAL | Status: DC | PRN

## 2014-01-16 MED ORDER — GABAPENTIN 300 MG PO CAPS: 300.0000 mg | ORAL_CAPSULE | Freq: Two times a day (BID) | ORAL | Status: DC

## 2014-01-16 NOTE — Assessment & Plan Note (Addendum)
Symptoms resurface, back on gabapentin an  symptoms  well-controlled

## 2014-01-16 NOTE — Progress Notes (Signed)
Pre visit review using our clinic review tool, if applicable. No additional management support is needed unless otherwise documented below in the visit note. 

## 2014-01-16 NOTE — Assessment & Plan Note (Signed)
Mild anxiety, well-controlled with Xanax, no depression.

## 2014-01-16 NOTE — Progress Notes (Signed)
Subjective:    Patient ID: Kevin Bass, male    DOB: [birth year], 73 y.o.   MRN: [id number]  DOS:  01/16/2014 Type of visit - description :   Here for Medicare AWV:  1. Risk factors based on Past M, S, F history: reviewed 2. Physical Activities:  Takes walks, yard work 3. Depression/mood:  (-) screening   4. Hearing:  No problemss noted or reported  5. ADL's:  Independent   6. Fall Risk: no recent falls , see instructions   7. home Safety: does feel safe at home   8. Height, weight, &visual acuity: see VS, sees eye doctor regularly, wears glasses   9. Counseling: provided 10. Labs ordered based on risk factors: if needed   11. Referral Coordination: if needed 12.  Care Plan, see assessment and plan   13.   Cognitive Assessment: MS and motor skills normal 14. Care team updated 15. Personalized care list  provided   In addition, today we discussed the following:  Mitral valve disorder,  follow-up at [facility], asymptomatic HTN-- good med compliance  Elevated PSA, saw urology few weeks ago and was told he was stable. Facial pain, restart the Neurontin due to resurface of the pain, sx are better  ROS Denies chest pain or difficulty breathing No nausea, vomiting, diarrhea or blood in the stools No dysuria, gross hematuria or difficulty urinating. Does have occasional nocturia which is a stable, usually 2 or 3 times a night.      Past Medical History  Diagnosis Date  . Colon cancer     s/p partial colectomy 1990, Cscope neg 7-09, next 2014  . DISORDER, MITRAL VALVE     MV recontruction at [place] 2006----still needs ABX for SBE prophylaxis   . HYPERLIPIDEMIA 06/15/2006  . HYPERTROPHY PROSTATE W/O UR OBST & OTH LUTS 10/16/2008  . HYPERGLYCEMIA, BORDERLINE 10/16/2008  . Allergic rhinitis   . Heart murmur     Past Surgical History  Procedure Laterality Date  . Colectomy  1990  . Mitral valve annuloplasty      [facility] 2006  . Hernia repair Left 2014    [facility] rep  w/mesh- March 2014    History   Social History  . Marital Status: Married    Spouse Name: N/A    Number of Children: 2  . Years of Education: N/A   Occupational History  . retired     Social History Main Topics  . Smoking status: Former Smoker    Quit date: 02/23/1983  . Smokeless tobacco: Never Used  . Alcohol Use: Yes     Comment: wine sometimes   . Drug Use: No  . Sexual Activity: Not on file   Other Topics Concern  . Not on file   Social History Narrative   From [place]           Family History  Problem Relation Age of Onset  . Colon cancer Other     M side (cousins)  . Diabetes Neg Hx   . Stroke Neg Hx   . CAD Neg Hx   . Prostate cancer Neg Hx   . Rectal cancer Neg Hx       Medication List       This list is accurate as of: 01/16/14 10:22 AM.  Always use your most recent med list.               ALPRAZolam 0.25 MG dissolvable tablet  Commonly known as:  NIRAVAM  Take 1-2 tablets (0.25-0.5 mg total) by mouth 2 (two) times daily as needed for anxiety.     amoxicillin 500 MG capsule  Commonly known as:  AMOXIL  Take 2 capsules (1,000 mg total) by mouth 2 (two) times daily.     ARGININE PO  Take 3 each by mouth daily.     aspirin 81 MG tablet  Take 81 mg by mouth daily.     FISH OIL PO  Take 1 tablet by mouth daily.     gabapentin 300 MG capsule  Commonly known as:  NEURONTIN  Take 300 mg by mouth 2 (two) times daily.     loratadine 10 MG tablet  Commonly known as:  CLARITIN  Take 10 mg by mouth daily as needed for allergies.     metoprolol tartrate 25 MG tablet  Commonly known as:  LOPRESSOR  take 1/2 tablet by mouth daily     NON FORMULARY  Blood pressure control supplement- takes 3 daily     OCUVITE PRESERVISION PO  Take 1 tablet by mouth daily.     PROSTATE HEALTH PO  Take 1 tablet by mouth daily.     RED YEAST RICE PO  Take 2 each by mouth daily.           Objective:   Physical Exam BP 132/76 mmHg  Pulse 59   Temp(Src) 97.5 F (36.4 C) (Oral)  Ht 5\' 9"  (1.753 m)  Wt 180 lb 8 oz (81.874 kg)  BMI 26.64 kg/m2  SpO2 99%  General -- alert, well-developed, NAD.  Neck --no thyromegaly , normal carotid pulse  HEENT-- Not pale.   Lungs -- normal respiratory effort, no intercostal retractions, no accessory muscle use, and normal breath sounds.  Heart-- normal rate, regular rhythm, no murmur.  Abdomen-- Not distended, good bowel sounds,soft, non-tender. No bruit or mass.  Extremities-- no pretibial edema bilaterally  Neurologic--  alert & oriented X3. Speech normal, gait appropriate for age, strength symmetric and appropriate for age.  Psych-- Cognition and judgment appear intact. Cooperative with normal attention span and concentration. No anxious or depressed appearing.        Assessment & Plan:

## 2014-01-16 NOTE — Patient Instructions (Signed)
Get your blood work before you leave   Please come back to the office in 1 year  for a physical exam. Come back fasting          Preventive Care for Adults Ages 62 and over  Blood pressure check.** / Every 1 to 2 years.  Lipid and cholesterol check.**/ Every 5 years beginning at age 75.  Lung cancer screening. / Every year if you are aged 37-80 years and have a 30-pack-year history of smoking and currently smoke or have quit within the past 15 years. Yearly screening is stopped once you have quit smoking for at least 15 years or develop a health problem that would prevent you from having lung cancer treatment.  Fecal occult blood test (FOBT) of stool. / Every year beginning at age 8 and continuing until age 72. You may not have to do this test if you get a colonoscopy every 10 years.  Flexible sigmoidoscopy** or colonoscopy.** / Every 5 years for a flexible sigmoidoscopy or every 10 years for a colonoscopy beginning at age 15 and continuing until age 57.  Hepatitis C blood test.** / For all people born from [birth year] through 1967 and any individual with known risks for hepatitis C.  Abdominal aortic aneurysm (AAA) screening.** / A one-time screening for ages 57 to 72 years who are current or former smokers.  Skin self-exam. / Monthly.  Influenza vaccine. / Every year.  Tetanus, diphtheria, and acellular pertussis (Tdap/Td) vaccine.** / 1 dose of Td every 10 years.  Varicella vaccine.** / Consult your health care provider.  Zoster vaccine.** / 1 dose for adults aged 29 years or older.  Pneumococcal 13-valent conjugate (PCV13) vaccine.** / Consult your health care provider.  Pneumococcal polysaccharide (PPSV23) vaccine.** / 1 dose for all adults aged 96 years and older.  Meningococcal vaccine.** / Consult your health care provider.  Hepatitis A vaccine.** / Consult your health care provider.  Hepatitis B vaccine.** / Consult your health care provider.  Haemophilus influenzae  type b (Hib) vaccine.** / Consult your health care provider. **Family history and personal history of risk and conditions may change your health care provider's recommendations. Document Released: 04/06/2001 Document Revised: 02/13/2013 Document Reviewed: 07/06/2010 [facility] Patient Information 2015 [place], [place]. This information is not intended to replace advice given to you by your health care provider. Make sure you discuss any questions you have with your health care provider.      Fall Prevention and Home Safety Falls cause injuries and can affect all age groups. It is possible to use preventive measures to significantly decrease the likelihood of falls. There are many simple measures which can make your home safer and prevent falls. OUTDOORS  Repair cracks and edges of walkways and driveways.  Remove high doorway thresholds.  Trim shrubbery on the main path into your home.  Have good outside lighting.  Clear walkways of tools, rocks, debris, and clutter.  Check that handrails are not broken and are securely fastened. Both sides of steps should have handrails.  Have leaves, snow, and ice cleared regularly.  Use sand or salt on walkways during winter months.  In the garage, clean up grease or oil spills. BATHROOM  Install night lights.  Install grab bars by the toilet and in the tub and shower.  Use non-skid mats or decals in the tub or shower.  Place a plastic non-slip stool in the shower to sit on, if needed.  Keep floors dry and clean up all water on the  floor immediately.  Remove soap buildup in the tub or shower on a regular basis.  Secure bath mats with non-slip, double-sided rug tape.  Remove throw rugs and tripping hazards from the floors. BEDROOMS  Install night lights.  Make sure a bedside light is easy to reach.  Do not use oversized bedding.  Keep a telephone by your bedside.  Have a firm chair with side arms to use for getting  dressed.  Remove throw rugs and tripping hazards from the floor. KITCHEN  Keep handles on pots and pans turned toward the center of the stove. Use back burners when possible.  Clean up spills quickly and allow time for drying.  Avoid walking on wet floors.  Avoid hot utensils and knives.  Position shelves so they are not too high or low.  Place commonly used objects within easy reach.  If necessary, use a sturdy step stool with a grab bar when reaching.  Keep electrical cables out of the way.  Do not use floor polish or wax that makes floors slippery. If you must use wax, use non-skid floor wax.  Remove throw rugs and tripping hazards from the floor. STAIRWAYS  Never leave objects on stairs.  Place handrails on both sides of stairways and use them. Fix any loose handrails. Make sure handrails on both sides of the stairways are as long as the stairs.  Check carpeting to make sure it is firmly attached along stairs. Make repairs to worn or loose carpet promptly.  Avoid placing throw rugs at the top or bottom of stairways, or properly secure the rug with carpet tape to prevent slippage. Get rid of throw rugs, if possible.  Have an electrician put in a light switch at the top and bottom of the stairs. OTHER FALL PREVENTION TIPS  Wear low-heel or rubber-soled shoes that are supportive and fit well. Wear closed toe shoes.  When using a stepladder, make sure it is fully opened and both spreaders are firmly locked. Do not climb a closed stepladder.  Add color or contrast paint or tape to grab bars and handrails in your home. Place contrasting color strips on first and last steps.  Learn and use mobility aids as needed. Install an electrical emergency response system.  Turn on lights to avoid dark areas. Replace light bulbs that burn out immediately. Get light switches that glow.  Arrange furniture to create clear pathways. Keep furniture in the same place.  Firmly attach  carpet with non-skid or double-sided tape.  Eliminate uneven floor surfaces.  Select a carpet pattern that does not visually hide the edge of steps.  Be aware of all pets. OTHER HOME SAFETY TIPS  Set the water temperature for 120 F (48.8 C).  Keep emergency numbers on or near the telephone.  Keep smoke detectors on every level of the home and near sleeping areas. Document Released: 01/29/2002 Document Revised: 08/10/2011 Document Reviewed: 04/30/2011 [facility] Patient Information 2015 [place], [place]. This information is not intended to replace advice given to you by your health care provider. Make sure you discuss any questions you have with your health care provider.

## 2014-01-16 NOTE — Assessment & Plan Note (Signed)
A1c is stable over the years, recheck on return to the office

## 2014-01-16 NOTE — Assessment & Plan Note (Addendum)
on beta blockers, blood pressure satisfactory, no change, labs

## 2014-01-16 NOTE — Assessment & Plan Note (Addendum)
Td 2008 Pneumonia shot 2006, 2012 prevnar-- today  Flu shot-- declined , benefits discussed Zostavax-- had it per pt , no documentation  Cscope-- 09-2012 neg, next 5 years  PSAs per urology  Diet-exercise discussed

## 2014-01-16 NOTE — Assessment & Plan Note (Addendum)
Previously intolerant to statins, recommend diet and exercise; we agreed to check a FLP, consider zetia

## 2014-01-22 ENCOUNTER — Encounter: Payer: Self-pay | Admitting: Internal Medicine

## 2014-05-10 ENCOUNTER — Encounter: Payer: Self-pay | Admitting: Internal Medicine

## 2014-05-10 ENCOUNTER — Ambulatory Visit (INDEPENDENT_AMBULATORY_CARE_PROVIDER_SITE_OTHER): Payer: Medicare Other | Admitting: Internal Medicine

## 2014-05-10 VITALS — BP 128/64 | HR 60 | Temp 97.5°F | Ht 69.0 in | Wt 178.4 lb

## 2014-05-10 DIAGNOSIS — A09 Infectious gastroenteritis and colitis, unspecified: Secondary | ICD-10-CM

## 2014-05-10 DIAGNOSIS — J069 Acute upper respiratory infection, unspecified: Secondary | ICD-10-CM

## 2014-05-10 NOTE — Progress Notes (Signed)
Pre visit review using our clinic review tool, if applicable. No additional management support is needed unless otherwise documented below in the visit note. 

## 2014-05-10 NOTE — Progress Notes (Signed)
Subjective:    Patient ID: Kevin Bass, male    DOB: 06/25/1940, 74 y.o.   MRN: [id number]  DOS:  05/10/2014 Type of visit - description : acute Interval history: The patient returned from [place] 05/05/2014, landed in [place] , was 1 day there, finally came home. Symptoms started right before he left [place]: Nasal congestion, postnasal dripping. Also for the last 2 nights has developed loose stools. Denies fever or chills Mild cough mostly at night, he thinks related to postnasal dripping. Denies abdominal pain, nausea, vomiting, blood in the stools. Taking Delsym and Aleve sinus   Review of Systems See HPI  Past Medical History  Diagnosis Date  . Colon cancer     s/p partial colectomy 1990, Cscope neg 7-09, next 2014  . DISORDER, MITRAL VALVE     MV recontruction at [place] 2006----still needs ABX for SBE prophylaxis   . HYPERLIPIDEMIA 06/15/2006  . HYPERTROPHY PROSTATE W/O UR OBST & OTH LUTS 10/16/2008  . HYPERGLYCEMIA, BORDERLINE 10/16/2008  . Allergic rhinitis   . Heart murmur   . Elevated PSA     [name]    Past Surgical History  Procedure Laterality Date  . Colectomy  1990  . Mitral valve annuloplasty      [facility] 2006  . Hernia repair Left 2014    [facility] rep w/mesh- March 2014    History   Social History  . Marital Status: Married    Spouse Name: N/A  . Number of Children: 2  . Years of Education: N/A   Occupational History  . retired     Social History Main Topics  . Smoking status: Former Smoker    Quit date: 02/23/1983  . Smokeless tobacco: Never Used  . Alcohol Use: Yes     Comment: wine sometimes   . Drug Use: No  . Sexual Activity: Not on file   Other Topics Concern  . Not on file   Social History Narrative   From [place], household pt and wife              Medication List       This list is accurate as of: 05/10/14 11:51 AM.  Always use your most recent med list.               ALPRAZolam 0.25 MG dissolvable tablet  Commonly  known as:  NIRAVAM  Take 1-2 tablets (0.25-0.5 mg total) by mouth 2 (two) times daily as needed for anxiety.     ARGININE PO  Take 3 each by mouth daily.     aspirin 81 MG tablet  Take 81 mg by mouth daily.     FISH OIL PO  Take 1 tablet by mouth daily.     gabapentin 300 MG capsule  Commonly known as:  NEURONTIN  Take 1 capsule (300 mg total) by mouth 2 (two) times daily.     loratadine 10 MG tablet  Commonly known as:  CLARITIN  Take 10 mg by mouth daily as needed for allergies.     metoprolol tartrate 25 MG tablet  Commonly known as:  LOPRESSOR  Take 0.5 tablets (12.5 mg total) by mouth daily.     NON FORMULARY  Blood pressure control supplement- takes 3 daily     OCUVITE PRESERVISION PO  Take 1 tablet by mouth daily.     PROSTATE HEALTH PO  Take 1 tablet by mouth daily.     RED YEAST RICE PO  Take 2 each by  mouth daily.           Objective:   Physical Exam BP 128/64 mmHg  Pulse 60  Temp(Src) 97.5 F (36.4 C) (Oral)  Ht 5\' 9"  (1.753 m)  Wt 178 lb 6 oz (80.91 kg)  BMI 26.33 kg/m2  SpO2 98% General:   Well developed, well nourished . NAD.  HEENT:  Normocephalic . Face symmetric, atraumatic; nose is slightly congested, TMs normal, throat symmetric, no redness or discharge Lungs:  CTA B Normal respiratory effort, no intercostal retractions, no accessory muscle use. Heart: RRR,  no murmur.  Abdomen, not distended, soft, good bowel sounds, nontender Muscle skeletal: no pretibial edema bilaterally  Skin: Not pale. Not jaundice Neurologic:  alert & oriented X3.  Speech normal, gait appropriate for age and unassisted Psych--  Cognition and judgment appear intact.  Cooperative with normal attention span and concentration.  Behavior appropriate. No anxious or depressed appearing.       Assessment & Plan:    [name] Traveler's diarrhea  The patient presents with respiratory symptoms and diarrhea for 2 days in the context of returning from [place] few  days ago. While in [place] he visited a village, there were some farm animals. At this point what his symptoms are consistent with [name] and traveler's diarrhea, recommend conservative treatment, if not better he will let me know.

## 2014-05-10 NOTE — Patient Instructions (Signed)
Rest, fluids , tylenol  If  cough, take Mucinex DM twice a day as needed  Use OTC Nasocort or Flonase : 2 nasal sprays on each side of the nose daily until you feel better  For diarrhea, take OTC Pepto-Bismol as needed    Call if not gradually better over the next  10 days Call anytime if the symptoms are severe, you have high fever, short of breath, chest pain, severe cough, stomach  pain, increased diarrhea, blood in the stools

## 2014-06-07 ENCOUNTER — Telehealth: Payer: Self-pay

## 2014-06-07 ENCOUNTER — Ambulatory Visit (HOSPITAL_BASED_OUTPATIENT_CLINIC_OR_DEPARTMENT_OTHER)
Admission: RE | Admit: 2014-06-07 | Discharge: 2014-06-07 | Disposition: A | Discharge status: Home / Self Care | Payer: Medicare Other | Source: Ambulatory Visit | Attending: Internal Medicine | Admitting: Internal Medicine

## 2014-06-07 ENCOUNTER — Ambulatory Visit (INDEPENDENT_AMBULATORY_CARE_PROVIDER_SITE_OTHER): Payer: Medicare Other | Admitting: Internal Medicine

## 2014-06-07 ENCOUNTER — Encounter (HOSPITAL_BASED_OUTPATIENT_CLINIC_OR_DEPARTMENT_OTHER): Payer: Self-pay

## 2014-06-07 ENCOUNTER — Encounter: Payer: Self-pay | Admitting: Internal Medicine

## 2014-06-07 VITALS — BP 120/66 | HR 68 | Temp 98.1°F | Ht 69.0 in | Wt 175.4 lb

## 2014-06-07 DIAGNOSIS — R079 Chest pain, unspecified: Secondary | ICD-10-CM | POA: Diagnosis present

## 2014-06-07 DIAGNOSIS — R739 Hyperglycemia, unspecified: Secondary | ICD-10-CM

## 2014-06-07 LAB — D-DIMER, QUANTITATIVE (NOT AT ARMC): D DIMER QUANT: 0.93 ug{FEU}/mL — AB (ref 0.00–0.48)

## 2014-06-07 LAB — CBC WITH DIFFERENTIAL/PLATELET
Basophils Absolute: 0 10*3/uL (ref 0.0–0.1)
Basophils Relative: 0.6 % (ref 0.0–3.0)
EOS ABS: 0.2 10*3/uL (ref 0.0–0.7)
Eosinophils Relative: 3.9 % (ref 0.0–5.0)
HCT: 44.7 % (ref 39.0–52.0)
Hemoglobin: 15.2 g/dL (ref 13.0–17.0)
Lymphocytes Relative: 40.6 % (ref 12.0–46.0)
Lymphs Abs: 2 10*3/uL (ref 0.7–4.0)
MCHC: 34 g/dL (ref 30.0–36.0)
MCV: 82.9 fl (ref 78.0–100.0)
MONO ABS: 0.3 10*3/uL (ref 0.1–1.0)
Monocytes Relative: 5.6 % (ref 3.0–12.0)
NEUTROS PCT: 49.3 % (ref 43.0–77.0)
Neutro Abs: 2.5 10*3/uL (ref 1.4–7.7)
PLATELETS: 229 10*3/uL (ref 150.0–400.0)
RBC: 5.39 Mil/uL (ref 4.22–5.81)
RDW: 13.6 % (ref 11.5–15.5)
WBC: 5 10*3/uL (ref 4.0–10.5)

## 2014-06-07 LAB — BASIC METABOLIC PANEL
BUN: 17 mg/dL (ref 6–23)
CALCIUM: 9.4 mg/dL (ref 8.4–10.5)
CO2: 33 mEq/L — ABNORMAL HIGH (ref 19–32)
CREATININE: 0.91 mg/dL (ref 0.40–1.50)
Chloride: 102 mEq/L (ref 96–112)
GFR: 86.14 mL/min (ref >60.00)
GLUCOSE: 99 mg/dL (ref 70–99)
POTASSIUM: 4.3 meq/L (ref 3.5–5.1)
SODIUM: 137 meq/L (ref 135–145)

## 2014-06-07 LAB — HEMOGLOBIN A1C: Hgb A1c MFr Bld: 5.6 % (ref 4.6–6.5)

## 2014-06-07 MED ORDER — IOHEXOL 350 MG/ML SOLN: 100.0000 mL | Freq: Once | INTRAVENOUS | Status: AC | PRN

## 2014-06-07 NOTE — Progress Notes (Signed)
Pre visit review using our clinic review tool, if applicable. No additional management support is needed unless otherwise documented below in the visit note. 

## 2014-06-07 NOTE — Patient Instructions (Signed)
Get your blood work before you leave   Stop by the first floor and get the XR   If you have intense or persistent chest pain: Go to the ER  Will refer you to one of our cardiologists

## 2014-06-07 NOTE — Progress Notes (Signed)
Subjective:    Patient ID: Kevin Bass, male    DOB: 01-Aug-1940, 74 y.o.   MRN: [id number]  DOS:  06/07/2014 Type of visit - description : acute Interval history: 2 weeks history of on and off chest pain, located at the left upper anterior chest, usually one episode a day, may last few hours, intensity is usually 2/10, never more than 3/10. Not associated nausea, vomiting, diaphoresis. Happening that both rest and exertion. Pain not trigger or made worse by moving the arms or shoulders. He is able to walk 1 mile twice a day as usual without problems or symptoms. Was recently seen with [name] symptoms and diarrhea: Resolved   Review of Systems About 4 weeks ago he came back from [place], at the time did not experience any calf pain or swelling. Denies fever, chills, cough No difficulty breathing or lower extremity edema. No DOE He has some frequent burping but denies dysphagia or odynophagia  Past Medical History  Diagnosis Date  . Colon cancer     s/p partial colectomy 1990, Cscope neg 7-09, next 2014  . DISORDER, MITRAL VALVE     MV recontruction at [place] 2006----still needs ABX for SBE prophylaxis   . HYPERLIPIDEMIA 06/15/2006  . HYPERTROPHY PROSTATE W/O UR OBST & OTH LUTS 10/16/2008  . HYPERGLYCEMIA, BORDERLINE 10/16/2008  . Allergic rhinitis   . Heart murmur   . Elevated PSA     [name]    Past Surgical History  Procedure Laterality Date  . Colectomy  1990  . Mitral valve annuloplasty      [facility] 2006  . Hernia repair Left 2014    [facility] rep w/mesh- March 2014    History   Social History  . Marital Status: Married    Spouse Name: N/A  . Number of Children: 2  . Years of Education: N/A   Occupational History  . retired     Social History Main Topics  . Smoking status: Former Smoker    Quit date: 02/23/1983  . Smokeless tobacco: Never Used  . Alcohol Use: Yes     Comment: wine sometimes   . Drug Use: No  . Sexual Activity: Not on file   Other Topics  Concern  . Not on file   Social History Narrative   From [place], household pt and wife              Medication List       This list is accurate as of: 06/07/14 11:59 PM.  Always use your most recent med list.               ALPRAZolam 0.25 MG dissolvable tablet  Commonly known as:  NIRAVAM  Take 1-2 tablets (0.25-0.5 mg total) by mouth 2 (two) times daily as needed for anxiety.     ARGININE PO  Take 3 each by mouth daily.     aspirin 81 MG tablet  Take 81 mg by mouth daily.     FISH OIL PO  Take 1 tablet by mouth daily.     gabapentin 300 MG capsule  Commonly known as:  NEURONTIN  Take 1 capsule (300 mg total) by mouth 2 (two) times daily.     loratadine 10 MG tablet  Commonly known as:  CLARITIN  Take 10 mg by mouth daily as needed for allergies.     metoprolol tartrate 25 MG tablet  Commonly known as:  LOPRESSOR  Take 0.5 tablets (12.5 mg total) by mouth daily.  NON FORMULARY  Blood pressure control supplement- takes 3 daily     OCUVITE PRESERVISION PO  Take 1 tablet by mouth daily.     PROSTATE HEALTH PO  Take 1 tablet by mouth daily.     RED YEAST RICE PO  Take 2 each by mouth daily.           Objective:   Physical Exam BP 120/66 mmHg  Pulse 68  Temp(Src) 98.1 F (36.7 C) (Oral)  Ht 5\' 9"  (1.753 m)  Wt 175 lb 6 oz (79.55 kg)  BMI 25.89 kg/m2  SpO2 98% General:   Well developed, well nourished . NAD.  HEENT:  Normocephalic . Face symmetric, atraumatic, not pale or jaundice Lungs:  CTA B Normal respiratory effort, no intercostal retractions, no accessory muscle use. Chest wall not TTP Heart: RRR,  no murmur.  Abdomen:  Not distended, soft, non-tender. No rebound or rigidity. No mass,organomegaly Muscle skeletal: no pretibial edema bilaterally ; calves symmetric and not TTP Skin: Not pale. Not jaundice Neurologic:  alert & oriented X3.  Speech normal, gait appropriate for age and unassisted Psych--  Cognition and judgment  appear intact.  Cooperative with normal attention span and concentration.  Behavior appropriate. No anxious or depressed appearing.       Assessment & Plan:    Today , I spent more than 45   min with the patient: >50% of the time counseling regards pros and cons of further workup, reviewing abnormal d-dimer, ordering and reviewing additional x-rays.

## 2014-06-07 NOTE — Assessment & Plan Note (Addendum)
74 year old gentleman with a history of nonobstructive CAD, MVR repair 2006, hyperglycemia, hypertension, intolerant to statins who presents with chest pain for 2 weeks, somewhat atypical for CAD; symptoms happened in the context of a recent prolonged airplane trip. EKG today is at baseline. DDX include CAD, PE, muscle skeletal, others. Plan: Stat d-dimer, BMP, CBC and a A1c CT of the chest if indicated by d dimer or if sx persist Refer to cardiology ER if symptoms intense, severe or persistent  Addendum, d-dimer slightly elevated at 0.93 Other labs and x-ray normal Plan: CT chest Addendum, CT negative for PE, plan is the same

## 2014-06-07 NOTE — Telephone Encounter (Signed)
CRITICAL LAB: D-DIMER: 0.93

## 2014-06-07 NOTE — Telephone Encounter (Signed)
STAT CT ordered by Dr.[name], spoke with Pt informed him of D-Dimer results. Informed him Dr. [name] would like for him to have a CT to R/O blood clots in the body. Pt verbalized understanding and is currently on the way to [place] to have CT completed.

## 2014-06-11 ENCOUNTER — Encounter: Payer: Self-pay | Admitting: Internal Medicine

## 2014-06-14 ENCOUNTER — Other Ambulatory Visit: Payer: Self-pay | Admitting: Internal Medicine

## 2014-06-14 NOTE — Telephone Encounter (Signed)
Rx faxed to [facility] pharmacy.  

## 2014-06-14 NOTE — Telephone Encounter (Signed)
Pt is requesting refill on Alprazolam.  Last OV: 06/07/2014 Last Fill: 01/16/2014 #40 0RF UDS: 04/03/2013 Low risk  Please advise.

## 2014-06-14 NOTE — Telephone Encounter (Signed)
Rx printed, awaiting MD signature.  

## 2014-06-14 NOTE — Telephone Encounter (Signed)
Print #40 and 3 refills

## 2014-06-17 ENCOUNTER — Telehealth: Payer: Self-pay | Admitting: Internal Medicine

## 2014-06-17 ENCOUNTER — Other Ambulatory Visit: Payer: Self-pay

## 2014-06-17 MED ORDER — METOPROLOL TARTRATE 25 MG PO TABS: 12.5000 mg | ORAL_TABLET | Freq: Every day | ORAL | Status: DC

## 2014-06-17 NOTE — Telephone Encounter (Signed)
[name] name: Trev Relation to pt: self  Call back number: [phone] Pharmacy: [facility]  Reason for call:   Patient states that we sent in the wrong dosage for metoprolol. He states that he takes '50mg'$ . 90 day supply. Requesting callback.

## 2014-06-18 ENCOUNTER — Other Ambulatory Visit: Payer: Self-pay | Admitting: *Deleted

## 2014-06-18 MED ORDER — METOPROLOL TARTRATE 50 MG PO TABS: 25.0000 mg | ORAL_TABLET | Freq: Two times a day (BID) | ORAL | Status: DC

## 2014-06-18 NOTE — Telephone Encounter (Signed)
Spoke with patient and clarified.  Sent new rx and notified patient.

## 2014-06-18 NOTE — Telephone Encounter (Signed)
Patient states that he takes '25mg'$  Metoprolol twice daily. Patient e-mail from 05/23/13 confirms that this was the dosage patient was taking.  No changes to medications found in notes since then, but rx was given for '25mg'$  tablets with same signature (take 1/2 tablet twice daily).  Because there was no evidence of this dosage change- per patient e-mail from 05/23/13 he requested '50mg'$  tabs to be cut in half.  Spoke with Dr. [name] and it appears that when medication was reordered, signature was transferred incorrectly to new rx.  Rx resent for '50mg'$  tablets with instructions to take 1/2 tablet twice daily.  Patient notified.

## 2014-06-21 ENCOUNTER — Other Ambulatory Visit: Payer: Self-pay | Admitting: Internal Medicine

## 2014-06-27 ENCOUNTER — Encounter: Payer: Self-pay | Admitting: Internal Medicine

## 2014-07-02 NOTE — Progress Notes (Signed)
Kevin Bass

## 2014-07-03 ENCOUNTER — Encounter: Payer: Self-pay | Admitting: Cardiovascular Disease

## 2014-07-03 ENCOUNTER — Encounter: Payer: Medicare Other | Admitting: Cardiovascular Disease

## 2014-07-03 ENCOUNTER — Ambulatory Visit (INDEPENDENT_AMBULATORY_CARE_PROVIDER_SITE_OTHER): Payer: Medicare Other | Admitting: Cardiovascular Disease

## 2014-07-03 VITALS — BP 110/76 | HR 73 | Ht 70.0 in | Wt 178.1 lb

## 2014-07-03 DIAGNOSIS — R0789 Other chest pain: Secondary | ICD-10-CM | POA: Diagnosis not present

## 2014-07-03 NOTE — Patient Instructions (Signed)
Medication Instructions:  NO CHANGES  Labwork: NONE  Testing/Procedures: Your physician has requested that you have an exercise tolerance test. For further information please visit [email or web address]. Please also follow instruction sheet, as given.   Follow-Up: Your physician wants you to follow-up in: YEAR WITH  DR NISHAN  You will receive a reminder letter in the mail two months in advance. If you don't receive a letter, please call our office to schedule the follow-up appointment.  Any Other Special Instructions Will Be Listed Below (If Applicable).   

## 2014-07-03 NOTE — Progress Notes (Signed)
Cardiology Office Note   Date:  07/03/2014   ID:  Kevin Bass, DOB December 08, 1940, MRN [id number]  PCP:  Kathlene November, MD  Cardiologist:   Jenkins Rouge, MD   Chief Complaint  Patient presents with  . New Evaluation    Chest pain      History of Present Illness: Kevin Bass is a 74 y.o. male who presents for chest pain Seen by Dr [name] 06/07/14  Had [name] followed by atypical chest pains  CRF;s HTN and elevated lipids   2 weeks history of on and off chest pain, located at the left upper anterior chest, usually one episode a day, may last few hours, intensity is usually 2/10, never more than 3/10. Not associated nausea, vomiting, diaphoresis. Happening that both rest and exertion. Pain not trigger or made worse by moving the arms or shoulders. He is able to walk 1 mile twice a day as usual without problems or symptoms. Symptoms seemed to start after traveling to [place] and carrying a lot of shoulder luggage  Pain is positional   Notes indicate PMH mitral valve repair at [facility] in 2006    D dimer mildly elevated on 4/15  CT negative for PE reviewed  Aorta normal no grafts.  Mitral annuloplasty ring.  Minimal calcification of proximal LAD seen on coronaries  Echo report 05/2014 Normal EF 55-60% Mild AS  MAC with repair and no significant residual MR   However I reviewed the actual numbers and AVA calcuated at 2.9 cm2 mean gradient only 2.7 mmHg and peak 5.8 mmHg  Past Medical History  Diagnosis Date  . Colon cancer     s/p partial colectomy 1990, Cscope neg 7-09, next 2014  . DISORDER, MITRAL VALVE     MV recontruction at [place] 2006----still needs ABX for SBE prophylaxis   . HYPERLIPIDEMIA 06/15/2006  . HYPERTROPHY PROSTATE W/O UR OBST & OTH LUTS 10/16/2008  . HYPERGLYCEMIA, BORDERLINE 10/16/2008  . Allergic rhinitis   . Heart murmur   . Elevated PSA     [name]    Past Surgical History  Procedure Laterality Date  . Colectomy  1990  . Mitral valve annuloplasty     [facility] 2006  . Hernia repair Left 2014    [facility] rep w/mesh- March 2014     Current Outpatient Prescriptions  Medication Sig Dispense Refill  . ALPRAZolam (XANAX) 0.25 MG tablet Take 1-2 tablets (0.25-0.5 mg total) by mouth 2 (two) times daily as needed for anxiety. 40 tablet 3  . ARGININE PO Take 3 each by mouth daily.      Marland Kitchen aspirin 81 MG tablet Take 81 mg by mouth daily.      Marland Kitchen gabapentin (NEURONTIN) 300 MG capsule Take 1 capsule (300 mg total) by mouth 2 (two) times daily. 60 capsule 6  . loratadine (CLARITIN) 10 MG tablet Take 10 mg by mouth daily as needed for allergies.    . metoprolol (LOPRESSOR) 50 MG tablet Take 0.5 tablets (25 mg total) by mouth 2 (two) times daily. 90 tablet 1  . Misc Natural Products (PROSTATE HEALTH PO) Take 1 tablet by mouth daily.    . Multiple Vitamins-Minerals (OCUVITE PRESERVISION PO) Take 1 tablet by mouth daily.    . NON FORMULARY Blood pressure control supplement- takes 3 daily    . Omega-3 Fatty Acids (FISH OIL PO) Take 1 tablet by mouth daily.    . Red Yeast Rice Extract (RED YEAST RICE PO) Take 2 each by mouth daily.  No current facility-administered medications for this visit.    Allergies:   Review of patient's allergies indicates no known allergies.    Social History:  The patient  reports that he quit smoking about 31 years ago. He has never used smokeless tobacco. He reports that he drinks alcohol. He reports that he does not use illicit drugs.   Family History:  The patient's family history includes Colon cancer in his other. There is no history of Diabetes, Stroke, CAD, Prostate cancer, or Rectal cancer.    ROS:  Please see the history of present illness.   Otherwise, review of systems are positive for none.   All other systems are reviewed and negative.    PHYSICAL EXAM: VS:  BP 110/76 mmHg  Pulse 73  Ht '5\' 10"'$  (1.778 m)  Wt 178 lb 1.9 oz (80.795 kg)  BMI 25.56 kg/m2  SpO2 96% , BMI Body mass index is 25.56 kg/(m^2). Affect  appropriate Healthy:  appears stated age 71: normal Neck supple with no adenopathy JVP normal no bruits no thyromegaly Lungs clear with no wheezing and good diaphragmatic motion Heart:  S1/S2 no murmur, no rub, gallop or click PMI normal Abdomen: benighn, BS positve, no tenderness, no AAA no bruit.  No HSM or HJR Distal pulses intact with no bruits No edema Neuro non-focal Skin warm and dry No muscular weakness    EKG:   07/07/14  SR normal T waves    Recent Labs: 06/07/2014: BUN 17; Creatinine 0.91; Hemoglobin 15.2; Platelets 229.0; Potassium 4.3; Sodium 137    Lipid Panel    Component Value Date/Time   CHOL 229* 01/16/2014 1055   TRIG 110.0 01/16/2014 1055   HDL 35.10* 01/16/2014 1055   CHOLHDL 7 01/16/2014 1055   VLDL 22.0 01/16/2014 1055   LDLCALC 172* 01/16/2014 1055   LDLDIRECT 149.1 08/30/2012 1352      Wt Readings from Last 3 Encounters:  07/03/14 178 lb 1.9 oz (80.795 kg)  06/07/14 175 lb 6 oz (79.55 kg)  05/10/14 178 lb 6 oz (80.91 kg)      Other studies Reviewed: Additional studies/ records that were reviewed today include:  Care Everywhere notes from [place] and echo report [facility].    ASSESSMENT AND PLAN:  1.  MVR:  10 years post repair Excellent result done by Dr Marcene Duos  Continue SBE 2. AS:  No real murmur on exam consider f/u for new symptoms or change in murmur possible echo 2-3 years 3. Chest pain atypical likely muscular NSAI  F/u ETT 4. HTN:  On beta blocker well controlled     Current medicines are reviewed at length with the patient today.  The patient does not have concerns regarding medicines.  The following changes have been made:  no change  Labs/ tests ordered today include: ETT   No orders of the defined types were placed in this encounter.     Disposition:   FU with me in a year      Signed, Jenkins Rouge, MD  07/03/2014 2:50 PM    [facility] [address], [place], [place]  [address] Phone:  [phone]; Fax: [phone]

## 2014-08-23 ENCOUNTER — Ambulatory Visit: Payer: Medicare Other | Admitting: Physician Assistant

## 2014-08-30 ENCOUNTER — Ambulatory Visit: Payer: Medicare Other | Admitting: Cardiovascular Disease

## 2014-09-05 ENCOUNTER — Other Ambulatory Visit: Payer: Self-pay | Admitting: General Surgery

## 2014-09-05 DIAGNOSIS — M25559 Pain in unspecified hip: Secondary | ICD-10-CM

## 2014-09-05 DIAGNOSIS — M25551 Pain in right hip: Principal | ICD-10-CM

## 2014-09-05 DIAGNOSIS — G8929 Other chronic pain: Secondary | ICD-10-CM

## 2014-09-11 ENCOUNTER — Ambulatory Visit
Admission: RE | Admit: 2014-09-11 | Discharge: 2014-09-11 | Disposition: A | Discharge status: Home / Self Care | Payer: Medicare Other | Source: Ambulatory Visit | Attending: General Surgery | Admitting: General Surgery

## 2014-11-06 ENCOUNTER — Telehealth: Payer: Self-pay | Admitting: Internal Medicine

## 2014-11-06 ENCOUNTER — Encounter: Payer: Self-pay | Admitting: Internal Medicine

## 2014-11-06 NOTE — Telephone Encounter (Signed)
Patient sent a message, has seen black stools. Please call patient, schedule appointment for tomorrow.  If he has severe symptoms, red blood per rectum, dizziness, weakness: Go to the ER

## 2014-11-06 NOTE — Telephone Encounter (Signed)
Spoke with Pt, informed him we received MyChart message. Questioned if Pt has been using Pepto-bismol. Pt stated his last dosage was several days ago. I informed him sometimes Pepto-bismol can cause black stools; recommended he still come into the office. Pt scheduled acute visit on 11/07/2014 at 1100. Instructed Pt to go to ED if he notices any blood when wiping. Pt verbalized understanding.

## 2014-11-07 ENCOUNTER — Ambulatory Visit (INDEPENDENT_AMBULATORY_CARE_PROVIDER_SITE_OTHER): Payer: Medicare Other | Admitting: Internal Medicine

## 2014-11-07 ENCOUNTER — Encounter: Payer: Self-pay | Admitting: Internal Medicine

## 2014-11-07 VITALS — BP 124/70 | HR 60 | Temp 98.0°F | Ht 70.0 in | Wt 178.4 lb

## 2014-11-07 DIAGNOSIS — R195 Other fecal abnormalities: Secondary | ICD-10-CM

## 2014-11-07 DIAGNOSIS — Z09 Encounter for follow-up examination after completed treatment for conditions other than malignant neoplasm: Secondary | ICD-10-CM

## 2014-11-07 NOTE — Progress Notes (Signed)
Subjective:    Patient ID: Kevin Bass, male    DOB: [birth year], 74 y.o.   MRN: [id number]  DOS:  11/07/2014 Type of visit - description : Acute visit Interval history: Approximately 8 days ago he felt that he had an "upset stomach":  belching, loose stools. He took some Pepto-Bismol, last dose 3 days ago, now feels better but has noted the stools to be dark. Last bowel movement was yesterday and the stools were still black. They are not tarry, no actual red blood per rectum   Review of Systems  No fever chills No nausea vomiting. No actual abdominal pain. No mucus in the stools. Denies weakness on orthostatic sx   Past Medical History  Diagnosis Date  . Colon cancer     s/p partial colectomy 1990, Cscope neg 7-09, next 2014  . DISORDER, MITRAL VALVE     MV recontruction at [place] 2006----still needs ABX for SBE prophylaxis   . HYPERLIPIDEMIA 06/15/2006  . HYPERTROPHY PROSTATE W/O UR OBST & OTH LUTS 10/16/2008  . HYPERGLYCEMIA, BORDERLINE 10/16/2008  . Allergic rhinitis   . Heart murmur   . Elevated PSA     [name]  . Right groin pain     Dr. Redmond Pulling    Past Surgical History  Procedure Laterality Date  . Colectomy  1990  . Mitral valve annuloplasty      [facility] 2006  . Hernia repair Left 2014    [facility] rep w/mesh- March 2014    Social History   Social History  . Marital Status: Married    Spouse Name: suzanne  . Number of Children: 2  . Years of Education: college   Occupational History  . retired     Social History Main Topics  . Smoking status: Former Smoker    Quit date: 02/23/1983  . Smokeless tobacco: Never Used  . Alcohol Use: Yes     Comment: wine sometimes   . Drug Use: No  . Sexual Activity: Not on file   Other Topics Concern  . Not on file   Social History Narrative   From [place], household pt and wife              Medication List       This list is accurate as of: 11/07/14 11:59 PM.  Always use your most recent med list.               ALPRAZolam 0.25 MG tablet  Commonly known as:  XANAX  Take 1-2 tablets (0.25-0.5 mg total) by mouth 2 (two) times daily as needed for anxiety.     ARGININE PO  Take 3 each by mouth daily.     aspirin 81 MG tablet  Take 81 mg by mouth daily.     FISH OIL PO  Take 1 tablet by mouth daily.     gabapentin 300 MG capsule  Commonly known as:  NEURONTIN  Take 1 capsule (300 mg total) by mouth 2 (two) times daily.     loratadine 10 MG tablet  Commonly known as:  CLARITIN  Take 10 mg by mouth daily as needed for allergies.     metoprolol 50 MG tablet  Commonly known as:  LOPRESSOR  Take 0.5 tablets (25 mg total) by mouth 2 (two) times daily.     NON FORMULARY  Blood pressure control supplement- takes 3 daily     OCUVITE PRESERVISION PO  Take 1 tablet by mouth daily.  PROSTATE HEALTH PO  Take 1 tablet by mouth daily.     RED YEAST RICE PO  Take 2 each by mouth daily.           Objective:   Physical Exam BP 124/70 mmHg  Pulse 60  Temp(Src) 98 F (36.7 C) (Oral)  Ht '5\' 10"'$  (1.778 m)  Wt 178 lb 6 oz (80.91 kg)  BMI 25.59 kg/m2  SpO2 97% General:   Well developed, well nourished . NAD.  HEENT:  Normocephalic . Face symmetric, atraumatic Lungs:  CTA B Normal respiratory effort, no intercostal retractions, no accessory muscle use. Heart: RRR,  no murmur.  no pretibial edema bilaterally  Abdomen:  Not distended, soft, non-tender. No rebound or rigidity. No mass,organomegaly Skin: Not pale. Not jaundice DRE: Prostate slightly enlarged but not nodular or tender. The stools definitely dark, not tarry, no mucus or blood. Hemoccults negative Neurologic:  alert & oriented X3.  Speech normal, gait appropriate for age and unassisted Psych--  Cognition and judgment appear intact.  Cooperative with normal attention span and concentration.  Behavior appropriate. No anxious or depressed appearing.    Assessment & Plan:  Problem list> Prediabetes HTN Mitral  valve annuloplasty 2006-- needs SBE prophylaxis Elevated PSA and BPH Dr. [name] Hyperlipidemia Colon cancer partial colectomy 1990, colonoscopy 08-2007 negative, Cscope again 09-2012 normal, 5 years   A/P Change in the color of stools: Likely due to Pepto-Bismol, he is doing well, Hemoccult-negative. Recommend observation, see instructions. Primary care: Declined a flu shot

## 2014-11-07 NOTE — Patient Instructions (Signed)
Stop Pepto-Bismol  If the stools are not back to normal in 3-4 days to let me know.  Call anytime if you have fever, chills, stomach pain, red blood in the stools, diarrhea.

## 2014-11-07 NOTE — Progress Notes (Signed)
Pre visit review using our clinic review tool, if applicable. No additional management support is needed unless otherwise documented below in the visit note. 

## 2014-11-08 DIAGNOSIS — Z09 Encounter for follow-up examination after completed treatment for conditions other than malignant neoplasm: Secondary | ICD-10-CM | POA: Insufficient documentation

## 2014-11-08 NOTE — Assessment & Plan Note (Signed)
Change in the color of stools: Likely due to Pepto-Bismol, he is doing well, Hemoccult-negative. Recommend observation, see instructions. Primary care: Declined a flu shot

## 2014-12-13 ENCOUNTER — Other Ambulatory Visit: Payer: Self-pay | Admitting: Internal Medicine

## 2015-01-03 ENCOUNTER — Encounter: Payer: Self-pay | Admitting: Internal Medicine

## 2015-01-21 ENCOUNTER — Other Ambulatory Visit: Payer: Self-pay | Admitting: Internal Medicine

## 2015-04-18 ENCOUNTER — Emergency Department (HOSPITAL_BASED_OUTPATIENT_CLINIC_OR_DEPARTMENT_OTHER)
Admission: EM | Admit: 2015-04-18 | Discharge: 2015-04-19 | Disposition: A | Discharge status: Home / Self Care | Payer: Medicare Other | Attending: Emergency Medicine | Admitting: Emergency Medicine

## 2015-04-18 ENCOUNTER — Encounter (HOSPITAL_BASED_OUTPATIENT_CLINIC_OR_DEPARTMENT_OTHER): Payer: Self-pay | Admitting: Emergency Medicine

## 2015-04-18 DIAGNOSIS — W19XXXA Unspecified fall, initial encounter: Secondary | ICD-10-CM

## 2015-04-18 DIAGNOSIS — E785 Hyperlipidemia, unspecified: Secondary | ICD-10-CM | POA: Diagnosis not present

## 2015-04-18 DIAGNOSIS — Y9301 Activity, walking, marching and hiking: Secondary | ICD-10-CM | POA: Diagnosis not present

## 2015-04-18 DIAGNOSIS — Z79899 Other long term (current) drug therapy: Secondary | ICD-10-CM | POA: Diagnosis not present

## 2015-04-18 DIAGNOSIS — W010XXA Fall on same level from slipping, tripping and stumbling without subsequent striking against object, initial encounter: Secondary | ICD-10-CM | POA: Insufficient documentation

## 2015-04-18 DIAGNOSIS — S0181XA Laceration without foreign body of other part of head, initial encounter: Secondary | ICD-10-CM

## 2015-04-18 DIAGNOSIS — Z87891 Personal history of nicotine dependence: Secondary | ICD-10-CM | POA: Diagnosis not present

## 2015-04-18 DIAGNOSIS — Y9289 Other specified places as the place of occurrence of the external cause: Secondary | ICD-10-CM | POA: Diagnosis not present

## 2015-04-18 DIAGNOSIS — Z85038 Personal history of other malignant neoplasm of large intestine: Secondary | ICD-10-CM | POA: Diagnosis not present

## 2015-04-18 DIAGNOSIS — Z87438 Personal history of other diseases of male genital organs: Secondary | ICD-10-CM | POA: Insufficient documentation

## 2015-04-18 DIAGNOSIS — Z8679 Personal history of other diseases of the circulatory system: Secondary | ICD-10-CM | POA: Insufficient documentation

## 2015-04-18 DIAGNOSIS — Z7982 Long term (current) use of aspirin: Secondary | ICD-10-CM | POA: Insufficient documentation

## 2015-04-18 DIAGNOSIS — Y998 Other external cause status: Secondary | ICD-10-CM | POA: Diagnosis not present

## 2015-04-18 MED ORDER — LIDOCAINE HCL 2 % IJ SOLN
INTRAMUSCULAR | Status: AC
  Administered 2015-04-19: 400 mg
  Filled 2015-04-18: qty 20

## 2015-04-18 NOTE — ED Notes (Signed)
Fell taking towels from laundry room. Laceration to upper forehead.

## 2015-04-19 NOTE — ED Notes (Signed)
Fell hit rt eye brow  w lac  Bleeding controlled

## 2015-04-19 NOTE — Discharge Instructions (Signed)
Return to ED for your PCP in 7 days for suture removal. Return sooner for fevers, increased redness or swelling, cloudy drainage, headache, vision changes, vomiting or other concerning symptoms.  Facial Laceration  A facial laceration is a cut on the face. These injuries can be painful and cause bleeding. Lacerations usually heal quickly, but they need special care to reduce scarring. DIAGNOSIS  Your health care provider will take a medical history, ask for details about how the injury occurred, and examine the wound to determine how deep the cut is. TREATMENT  Some facial lacerations may not require closure. Others may not be able to be closed because of an increased risk of infection. The risk of infection and the chance for successful closure will depend on various factors, including the amount of time since the injury occurred. The wound may be cleaned to help prevent infection. If closure is appropriate, pain medicines may be given if needed. Your health care provider will use stitches (sutures), wound glue (adhesive), or skin adhesive strips to repair the laceration. These tools bring the skin edges together to allow for faster healing and a better cosmetic outcome. If needed, you may also be given a tetanus shot. HOME CARE INSTRUCTIONS  Only take over-the-counter or prescription medicines as directed by your health care provider.  Follow your health care provider's instructions for wound care. These instructions will vary depending on the technique used for closing the wound. For Sutures:  Keep the wound clean and dry.   If you were given a bandage (dressing), you should change it at least once a day. Also change the dressing if it becomes wet or dirty, or as directed by your health care provider.   Wash the wound with soap and water 2 times a day. Rinse the wound off with water to remove all soap. Pat the wound dry with a clean towel.   After cleaning, apply a thin layer of the  antibiotic ointment recommended by your health care provider. This will help prevent infection and keep the dressing from sticking.   You may shower as usual after the first 24 hours. Do not soak the wound in water until the sutures are removed.   Get your sutures removed as directed by your health care provider. With facial lacerations, sutures should usually be taken out after 4-5 days to avoid stitch marks.   Wait a few days after your sutures are removed before applying any makeup. For Skin Adhesive Strips:  Keep the wound clean and dry.   Do not get the skin adhesive strips wet. You may bathe carefully, using caution to keep the wound dry.   If the wound gets wet, pat it dry with a clean towel.   Skin adhesive strips will fall off on their own. You may trim the strips as the wound heals. Do not remove skin adhesive strips that are still stuck to the wound. They will fall off in time.  For Wound Adhesive:  You may briefly wet your wound in the shower or bath. Do not soak or scrub the wound. Do not swim. Avoid periods of heavy sweating until the skin adhesive has fallen off on its own. After showering or bathing, gently pat the wound dry with a clean towel.   Do not apply liquid medicine, cream medicine, ointment medicine, or makeup to your wound while the skin adhesive is in place. This may loosen the film before your wound is healed.   If a dressing is placed  over the wound, be careful not to apply tape directly over the skin adhesive. This may cause the adhesive to be pulled off before the wound is healed.   Avoid prolonged exposure to sunlight or tanning lamps while the skin adhesive is in place.  The skin adhesive will usually remain in place for 5-10 days, then naturally fall off the skin. Do not pick at the adhesive film.  After Healing: Once the wound has healed, cover the wound with sunscreen during the day for 1 full year. This can help minimize scarring. Exposure  to ultraviolet light in the first year will darken the scar. It can take 1-2 years for the scar to lose its redness and to heal completely.  SEEK MEDICAL CARE IF:  You have a fever. SEEK IMMEDIATE MEDICAL CARE IF:  You have redness, pain, or swelling around the wound.   You see ayellowish-white fluid (pus) coming from the wound.    This information is not intended to replace advice given to you by your health care provider. Make sure you discuss any questions you have with your health care provider.   Document Released: 03/18/2004 Document Revised: 03/01/2014 Document Reviewed: 09/21/2012 Elsevier Interactive Patient Education 2016 [place], Adult A laceration is a cut that goes through all of the layers of the skin and into the tissue that is right under the skin. Some lacerations heal on their own. Others need to be closed with stitches (sutures), staples, skin adhesive strips, or skin glue. Proper laceration care minimizes the risk of infection and helps the laceration to heal better. HOW TO CARE FOR YOUR LACERATION If sutures or staples were used:  Keep the wound clean and dry.  If you were given a bandage (dressing), you should change it at least one time per day or as told by your health care provider. You should also change it if it becomes wet or dirty.  Keep the wound completely dry for the first 24 hours or as told by your health care provider. After that time, you may shower or bathe. However, make sure that the wound is not soaked in water until after the sutures or staples have been removed.  Clean the wound one time each day or as told by your health care provider:  Wash the wound with soap and water.  Rinse the wound with water to remove all soap.  Pat the wound dry with a clean towel. Do not rub the wound.  After cleaning the wound, apply a thin layer of antibiotic ointmentas told by your health care provider. This will help to prevent  infection and keep the dressing from sticking to the wound.  Have the sutures or staples removed as told by your health care provider. If skin adhesive strips were used:  Keep the wound clean and dry.  If you were given a bandage (dressing), you should change it at least one time per day or as told by your health care provider. You should also change it if it becomes dirty or wet.  Do not get the skin adhesive strips wet. You may shower or bathe, but be careful to keep the wound dry.  If the wound gets wet, pat it dry with a clean towel. Do not rub the wound.  Skin adhesive strips fall off on their own. You may trim the strips as the wound heals. Do not remove skin adhesive strips that are still stuck to the wound. They will fall off in  time. If skin glue was used:  Try to keep the wound dry, but you may briefly wet it in the shower or bath. Do not soak the wound in water, such as by swimming.  After you have showered or bathed, gently pat the wound dry with a clean towel. Do not rub the wound.  Do not do any activities that will make you sweat heavily until the skin glue has fallen off on its own.  Do not apply liquid, cream, or ointment medicine to the wound while the skin glue is in place. Using those may loosen the film before the wound has healed.  If you were given a bandage (dressing), you should change it at least one time per day or as told by your health care provider. You should also change it if it becomes dirty or wet.  If a dressing is placed over the wound, be careful not to apply tape directly over the skin glue. Doing that may cause the glue to be pulled off before the wound has healed.  Do not pick at the glue. The skin glue usually remains in place for 5-10 days, then it falls off of the skin. General Instructions  Take over-the-counter and prescription medicines only as told by your health care provider.  If you were prescribed an antibiotic medicine or ointment,  take or apply it as told by your doctor. Do not stop using it even if your condition improves.  To help prevent scarring, make sure to cover your wound with sunscreen whenever you are outside after stitches are removed, after adhesive strips are removed, or when glue remains in place and the wound is healed. Make sure to wear a sunscreen of at least 30 SPF.  Do not scratch or pick at the wound.  Keep all follow-up visits as told by your health care provider. This is important.  Check your wound every day for signs of infection. Watch for:  Redness, swelling, or pain.  Fluid, blood, or pus.  Raise (elevate) the injured area above the level of your heart while you are sitting or lying down, if possible. SEEK MEDICAL CARE IF:  You received a tetanus shot and you have swelling, severe pain, redness, or bleeding at the injection site.  You have a fever.  A wound that was closed breaks open.  You notice a bad smell coming from your wound or your dressing.  You notice something coming out of the wound, such as wood or glass.  Your pain is not controlled with medicine.  You have increased redness, swelling, or pain at the site of your wound.  You have fluid, blood, or pus coming from your wound.  You notice a change in the color of your skin near your wound.  You need to change the dressing frequently due to fluid, blood, or pus draining from the wound.  You develop a new rash.  You develop numbness around the wound. SEEK IMMEDIATE MEDICAL CARE IF:  You develop severe swelling around the wound.  Your pain suddenly increases and is severe.  You develop painful lumps near the wound or on skin that is anywhere on your body.  You have a red streak going away from your wound.  The wound is on your hand or foot and you cannot properly move a finger or toe.  The wound is on your hand or foot and you notice that your fingers or toes look pale or bluish.   This information is not  intended to replace advice given to you by your health care provider. Make sure you discuss any questions you have with your health care provider.   Document Released: 02/08/2005 Document Revised: 06/25/2014 Document Reviewed: 02/04/2014 Elsevier Interactive Patient Education [employer].

## 2015-04-19 NOTE — ED Provider Notes (Signed)
CSN: [id number]     Arrival date & time 04/18/15  2333 History   First MD Initiated Contact with Patient 04/18/15 2343     Chief Complaint  Patient presents with  . Facial Laceration     (Consider location/radiation/quality/duration/timing/severity/associated sxs/prior Treatment) HPI Kevin Bass is a 75 y.o. male history of hyperlipidemia, hypertension, comes in for evaluation of facial laceration. Patient reports he was walking out of the laundry room, tripped over a tall, fell face forward onto the carpet. He believes his glasses may have cut his eye. He denies any LOC, headache, vision changes, nausea or vomiting, numbness or weakness. Reports he feels very well. Tetanus updated 2 months ago. No anticoagulation. No interventions attempted prior to arrival. No other alleviating or aggravating factors.  Past Medical History  Diagnosis Date  . Colon cancer [facility])     s/p partial colectomy 1990, Cscope neg 7-09, next 2014  . DISORDER, MITRAL VALVE     MV recontruction at [address] 2006----still needs ABX for SBE prophylaxis   . HYPERLIPIDEMIA 06/15/2006  . HYPERTROPHY PROSTATE W/O UR OBST & OTH LUTS 10/16/2008  . HYPERGLYCEMIA, BORDERLINE 10/16/2008  . Allergic rhinitis   . Heart murmur   . Elevated PSA     [name]  . Right groin pain     Dr. Redmond Pulling   Past Surgical History  Procedure Laterality Date  . Colectomy  1990  . Mitral valve annuloplasty      [facility] 2006  . Hernia repair Left 2014    [facility] rep w/mesh- March 2014   Family History  Problem Relation Age of Onset  . Colon cancer Other     M side (cousins)  . Diabetes Neg Hx   . Stroke Neg Hx   . CAD Neg Hx   . Prostate cancer Neg Hx   . Rectal cancer Neg Hx    Social History  Substance Use Topics  . Smoking status: Former Smoker    Quit date: 02/23/1983  . Smokeless tobacco: Never Used  . Alcohol Use: Yes     Comment: wine sometimes     Review of Systems A 10 point review of systems was completed and was negative  except for pertinent positives and negatives as mentioned in the history of present illness     Allergies  Statins  Home Medications   Prior to Admission medications   Medication Sig Start Date End Date Taking? Authorizing Provider  ALPRAZolam (XANAX) 0.25 MG tablet Take 1-2 tablets (0.25-0.5 mg total) by mouth 2 (two) times daily as needed for anxiety. 06/14/14   Colon Branch, MD  ARGININE PO Take 3 each by mouth daily.      Historical Provider, MD  aspirin 81 MG tablet Take 81 mg by mouth daily.      Historical Provider, MD  gabapentin (NEURONTIN) 300 MG capsule Take 1 capsule (300 mg total) by mouth 2 (two) times daily. 01/21/15   Colon Branch, MD  loratadine (CLARITIN) 10 MG tablet Take 10 mg by mouth daily as needed for allergies.    Historical Provider, MD  metoprolol (LOPRESSOR) 50 MG tablet Take 0.5 tablets (25 mg total) by mouth 2 (two) times daily. 12/16/14   Colon Branch, MD  Misc Natural Products (PROSTATE HEALTH PO) Take 1 tablet by mouth daily.    Historical Provider, MD  Multiple Vitamins-Minerals (OCUVITE PRESERVISION PO) Take 1 tablet by mouth daily.    Historical Provider, MD  NON FORMULARY Blood pressure control supplement-  takes 3 daily    Historical Provider, MD  Omega-3 Fatty Acids (FISH OIL PO) Take 1 tablet by mouth daily.    Historical Provider, MD  Red Yeast Rice Extract (RED YEAST RICE PO) Take 2 each by mouth daily.      Historical Provider, MD   BP 146/72 mmHg  Pulse 78  Temp(Src) 98.8 F (37.1 C) (Oral)  Resp 18  Ht '5\' 10"'$  (1.778 m)  Wt 79.379 kg  BMI 25.11 kg/m2  SpO2 98% Physical Exam  Constitutional: He is oriented to person, place, and time. He appears well-developed and well-nourished.  HENT:  Head: Normocephalic and atraumatic.  Mouth/Throat: Oropharynx is clear and moist.  Linear laceration noted in line over right eyebrow. Approximately 2 cm in length. Hemostasis achieved PTA  Eyes: Conjunctivae and EOM are normal. Pupils are equal, round, and  reactive to light. Right eye exhibits no discharge. Left eye exhibits no discharge. No scleral icterus.  Neck: Normal range of motion. Neck supple.  Cardiovascular: Normal rate, regular rhythm and normal heart sounds.   Pulmonary/Chest: Effort normal and breath sounds normal. No respiratory distress. He has no wheezes. He has no rales.  Abdominal: Soft. There is no tenderness.  Musculoskeletal: He exhibits no tenderness.  Neurological: He is alert and oriented to person, place, and time.  Cranial Nerves II-XII grossly intact. Motor strength 5/5 in all 4 extremities. Sensation intact to light touch. Completes fine motor coordination movements without difficulty. Gait is baseline. At baseline per wife in the room.  Skin: Skin is warm and dry. No rash noted.  Psychiatric: He has a normal mood and affect.  Nursing note and vitals reviewed.   ED Course  Procedures (including critical care time) Labs Review Labs Reviewed - No data to display  Imaging Review No results found. I have personally reviewed and evaluated these images and lab results as part of my medical decision-making.   EKG Interpretation None     LACERATION REPAIR Performed by: Verl Dicker Authorized by: Verl Dicker Consent: Verbal consent obtained. Risks and benefits: risks, benefits and alternatives were discussed Consent given by: patient Patient identity confirmed: provided demographic data Prepped and Draped in normal sterile fashion Wound explored  Laceration Location: Right eyebrow  Laceration Length: 2 cm  No Foreign Bodies seen or palpated  Anesthesia: local infiltration  Local anesthetic: lidocaine 2 % without epinephrine  Anesthetic total: 3 ml  Irrigation method: syringe Amount of cleaning: standard  Skin closure: 6-0 Prolene   Number of sutures: 5  Technique: Simple interrupted   Patient tolerance: Patient tolerated the procedure well with no immediate  complications.  MDM  Kevin Bass is a 75 y.o. male with a history of hyperlipidemia, hypertension comes in for evaluation of right eyebrow laceration after mechanical fall. No anticoagulation. Laceration likely secondary to glasses as patient fell on carpet. No LOC, nausea or vomiting, headache, vision changes, numbness or weakness or other concerning symptoms. Nonfocal neuro exam and otherwise unremarkable physical exam. Patient is accompanied by his wife who states that he is at baseline. Discussed risks and benefits associated with CT scan of the head and through shared decision making, decided to forego further imaging as patient appears very well. They verbalize understanding return precautions. Laceration repaired at bedside without immediate complication. Return in 7 days for suture removal. Tetanus up-to-date Prior to patient discharge, I discussed and reviewed this case with Dr.Molpus, who also saw and evaluated the patient and agrees with above plan.  Final diagnoses:  Fall, initial encounter  Facial laceration, initial encounter        Comer Locket, PA-C 04/19/15 0041  Shanon Rosser, MD 04/19/15 0165

## 2015-04-20 ENCOUNTER — Encounter: Payer: Self-pay | Admitting: Internal Medicine

## 2015-04-24 ENCOUNTER — Encounter: Payer: Self-pay | Admitting: Internal Medicine

## 2015-04-24 ENCOUNTER — Ambulatory Visit (INDEPENDENT_AMBULATORY_CARE_PROVIDER_SITE_OTHER): Payer: Medicare Other | Admitting: Internal Medicine

## 2015-04-24 VITALS — BP 116/76 | HR 59 | Temp 97.7°F | Ht 70.0 in | Wt 179.5 lb

## 2015-04-24 DIAGNOSIS — Z09 Encounter for follow-up examination after completed treatment for conditions other than malignant neoplasm: Secondary | ICD-10-CM

## 2015-04-24 DIAGNOSIS — T148 Other injury of unspecified body region: Secondary | ICD-10-CM

## 2015-04-24 DIAGNOSIS — IMO0002 Reserved for concepts with insufficient information to code with codable children: Secondary | ICD-10-CM

## 2015-04-24 NOTE — Patient Instructions (Signed)
  GO TO THE FRONT DESK Schedule a complete physical exam to be done in 2-3 months   Please be fasting

## 2015-04-24 NOTE — Progress Notes (Signed)
Pre visit review using our clinic review tool, if applicable. No additional management support is needed unless otherwise documented below in the visit note. 

## 2015-04-24 NOTE — Progress Notes (Signed)
Subjective:    Patient ID: Kevin Bass, male    DOB: July 25, 1940, 75 y.o.   MRN: [id number]  DOS:  04/24/2015 Type of visit - description : ER follow-up Interval history: Kevin Bass to the ER 04/14/2015, have to accidental fall at home, resulting in a laceration with no headache or loss of consciousness. No x-rays or labs were done.  Review of Systems  Since he left the ER he is feeling well, no headache, nausea, vomiting. No redness or discharge from the wound. Past Medical History  Diagnosis Date  . Colon cancer [facility])     s/p partial colectomy 1990, Cscope neg 7-09, next 2014  . DISORDER, MITRAL VALVE     MV recontruction at [address] 2006----still needs ABX for SBE prophylaxis   . HYPERLIPIDEMIA 06/15/2006  . HYPERTROPHY PROSTATE W/O UR OBST & OTH LUTS 10/16/2008  . HYPERGLYCEMIA, BORDERLINE 10/16/2008  . Allergic rhinitis   . Heart murmur   . Elevated PSA     Dr Kevin Bass  . Right groin pain     Dr. Redmond Bass    Past Surgical History  Procedure Laterality Date  . Colectomy  1990  . Mitral valve annuloplasty      [facility] 2006  . Hernia repair Left 2014    [facility] rep w/mesh- March 2014    Social History   Social History  . Marital Status: Married    Spouse Name: suzanne  . Number of Children: 2  . Years of Education: college   Occupational History  . retired     Social History Main Topics  . Smoking status: Former Smoker    Quit date: 02/23/1983  . Smokeless tobacco: Never Used  . Alcohol Use: Yes     Comment: wine sometimes   . Drug Use: No  . Sexual Activity: Not on file   Other Topics Concern  . Not on file   Social History Narrative   From [place], household pt and wife              Medication List       This list is accurate as of: 04/24/15 11:59 PM.  Always use your most recent med list.               ALPRAZolam 0.25 MG tablet  Commonly known as:  XANAX  Take 1-2 tablets (0.25-0.5 mg total) by mouth 2 (two) times daily as needed for anxiety.     ARGININE PO  Take 3 each by mouth daily.     aspirin 81 MG tablet  Take 81 mg by mouth daily.     FISH OIL PO  Take 1 tablet by mouth daily.     gabapentin 300 MG capsule  Commonly known as:  NEURONTIN  Take 1 capsule (300 mg total) by mouth 2 (two) times daily.     loratadine 10 MG tablet  Commonly known as:  CLARITIN  Take 10 mg by mouth daily as needed for allergies.     metoprolol 50 MG tablet  Commonly known as:  LOPRESSOR  Take 0.5 tablets (25 mg total) by mouth 2 (two) times daily.     NON FORMULARY  Blood pressure control supplement- takes 3 daily     OCUVITE PRESERVISION PO  Take 1 tablet by mouth daily.     PROSTATE HEALTH PO  Take 1 tablet by mouth daily.     RED YEAST RICE PO  Take 2 each by mouth daily.  Objective:   Physical Exam  Constitutional: He appears well-developed and well-nourished. No distress.  HENT:  Head: Normocephalic.    Eyes: EOM are normal. Pupils are equal, round, and reactive to light.  Skin: He is not diaphoretic.  Psychiatric: He has a normal mood and affect. His behavior is normal. Judgment and thought content normal.   BP 116/76 mmHg  Pulse 59  Temp(Src) 97.7 F (36.5 C) (Oral)  Ht '5\' 10"'$  (1.778 m)  Wt 179 lb 8 oz (81.421 kg)  BMI 25.76 kg/m2  SpO2 99%     Assessment & Plan:   Assessment Prediabetes HTN Mitral valve annuloplasty 2006-- needs SBE prophylaxis Elevated PSA and BPH Dr. Alinda Bass Hyperlipidemia Colon cancer partial colectomy 1990, colonoscopy 08-2007 negative, Cscope again 09-2012 normal, 5 years   Plan: Laceration: Seems to be healing well, multiple stitches removed, Steri-Strip place, wound  care discussed. RTC 3 months for a CPX.

## 2015-04-25 NOTE — Assessment & Plan Note (Signed)
Laceration: Seems to be healing well, multiple stitches removed, Steri-Strip place, wound  care discussed. RTC 3 months for a CPX.

## 2015-07-17 ENCOUNTER — Encounter: Payer: Self-pay | Admitting: Internal Medicine

## 2015-07-18 ENCOUNTER — Telehealth: Payer: Self-pay | Admitting: Internal Medicine

## 2015-07-18 NOTE — Telephone Encounter (Signed)
Appt scheduled 08/06/2015.

## 2015-07-18 NOTE — Progress Notes (Unsigned)
Pt has previously scheduled physical in March for 08/06/15 at 1:30.

## 2015-08-06 ENCOUNTER — Ambulatory Visit (INDEPENDENT_AMBULATORY_CARE_PROVIDER_SITE_OTHER): Payer: Medicare Other | Admitting: Internal Medicine

## 2015-08-06 ENCOUNTER — Encounter: Payer: Self-pay | Admitting: Internal Medicine

## 2015-08-06 VITALS — BP 124/68 | HR 56 | Temp 97.9°F | Ht 70.0 in | Wt 182.0 lb

## 2015-08-06 DIAGNOSIS — I10 Essential (primary) hypertension: Secondary | ICD-10-CM

## 2015-08-06 DIAGNOSIS — R739 Hyperglycemia, unspecified: Secondary | ICD-10-CM | POA: Diagnosis not present

## 2015-08-06 DIAGNOSIS — Z0000 Encounter for general adult medical examination without abnormal findings: Secondary | ICD-10-CM

## 2015-08-06 DIAGNOSIS — E785 Hyperlipidemia, unspecified: Secondary | ICD-10-CM

## 2015-08-06 LAB — LIPID PANEL
CHOL/HDL RATIO: 5
Cholesterol: 229 mg/dL — ABNORMAL HIGH (ref 0–200)
HDL: 41.8 mg/dL (ref >39.00)
LDL CALC: 159 mg/dL — AB (ref 0–99)
NONHDL: 186.72
Triglycerides: 137 mg/dL (ref 0.0–149.0)
VLDL: 27.4 mg/dL (ref 0.0–40.0)

## 2015-08-06 LAB — COMPREHENSIVE METABOLIC PANEL
ALT: 17 U/L (ref 0–53)
AST: 18 U/L (ref 0–37)
Albumin: 4.2 g/dL (ref 3.5–5.2)
Alkaline Phosphatase: 49 U/L (ref 39–117)
BUN: 17 mg/dL (ref 6–23)
CHLORIDE: 105 meq/L (ref 96–112)
CO2: 30 meq/L (ref 19–32)
CREATININE: 0.97 mg/dL (ref 0.40–1.50)
Calcium: 9.2 mg/dL (ref 8.4–10.5)
GFR: 79.78 mL/min (ref >60.00)
GLUCOSE: 84 mg/dL (ref 70–99)
POTASSIUM: 4.4 meq/L (ref 3.5–5.1)
SODIUM: 139 meq/L (ref 135–145)
Total Bilirubin: 0.5 mg/dL (ref 0.2–1.2)
Total Protein: 7 g/dL (ref 6.0–8.3)

## 2015-08-06 LAB — HEMOGLOBIN A1C: HEMOGLOBIN A1C: 5.5 % (ref 4.6–6.5)

## 2015-08-06 NOTE — Patient Instructions (Signed)
Get your blood work before you leave   Next visit 6-8 months   Please consider to get a health care of attorney       Fall Prevention and Home Safety Falls cause injuries and can affect all age groups. It is possible to use preventive measures to significantly decrease the likelihood of falls. There are many simple measures which can make your home safer and prevent falls. OUTDOORS  Repair cracks and edges of walkways and driveways.  Remove high doorway thresholds.  Trim shrubbery on the main path into your home.  Have good outside lighting.  Clear walkways of tools, rocks, debris, and clutter.  Check that handrails are not broken and are securely fastened. Both sides of steps should have handrails.  Have leaves, snow, and ice cleared regularly.  Use sand or salt on walkways during winter months.  In the garage, clean up grease or oil spills. BATHROOM  Install night lights.  Install grab bars by the toilet and in the tub and shower.  Use non-skid mats or decals in the tub or shower.  Place a plastic non-slip stool in the shower to sit on, if needed.  Keep floors dry and clean up all water on the floor immediately.  Remove soap buildup in the tub or shower on a regular basis.  Secure bath mats with non-slip, double-sided rug tape.  Remove throw rugs and tripping hazards from the floors. BEDROOMS  Install night lights.  Make sure a bedside light is easy to reach.  Do not use oversized bedding.  Keep a telephone by your bedside.  Have a firm chair with side arms to use for getting dressed.  Remove throw rugs and tripping hazards from the floor. KITCHEN  Keep handles on pots and pans turned toward the center of the stove. Use back burners when possible.  Clean up spills quickly and allow time for drying.  Avoid walking on wet floors.  Avoid hot utensils and knives.  Position shelves so they are not too high or low.  Place commonly used objects  within easy reach.  If necessary, use a sturdy step stool with a grab bar when reaching.  Keep electrical cables out of the way.  Do not use floor polish or wax that makes floors slippery. If you must use wax, use non-skid floor wax.  Remove throw rugs and tripping hazards from the floor. STAIRWAYS  Never leave objects on stairs.  Place handrails on both sides of stairways and use them. Fix any loose handrails. Make sure handrails on both sides of the stairways are as long as the stairs.  Check carpeting to make sure it is firmly attached along stairs. Make repairs to worn or loose carpet promptly.  Avoid placing throw rugs at the top or bottom of stairways, or properly secure the rug with carpet tape to prevent slippage. Get rid of throw rugs, if possible.  Have an electrician put in a light switch at the top and bottom of the stairs. OTHER FALL PREVENTION TIPS  Wear low-heel or rubber-soled shoes that are supportive and fit well. Wear closed toe shoes.  When using a stepladder, make sure it is fully opened and both spreaders are firmly locked. Do not climb a closed stepladder.  Add color or contrast paint or tape to grab bars and handrails in your home. Place contrasting color strips on first and last steps.  Learn and use mobility aids as needed. Install an electrical emergency response system.  Turn on  lights to avoid dark areas. Replace light bulbs that burn out immediately. Get light switches that glow.  Arrange furniture to create clear pathways. Keep furniture in the same place.  Firmly attach carpet with non-skid or double-sided tape.  Eliminate uneven floor surfaces.  Select a carpet pattern that does not visually hide the edge of steps.  Be aware of all pets. OTHER HOME SAFETY TIPS  Set the water temperature for 120 F (48.8 C).  Keep emergency numbers on or near the telephone.  Keep smoke detectors on every level of the home and near sleeping  areas. Document Released: 01/29/2002 Document Revised: 08/10/2011 Document Reviewed: 04/30/2011 [facility] Patient Information 2015 [place], [place]. This information is not intended to replace advice given to you by your health care provider. Make sure you discuss any questions you have with your health care provider.   Preventive Care for Adults Ages 70 and over  Blood pressure check.** / Every 1 to 2 years.  Lipid and cholesterol check.**/ Every 5 years beginning at age 8.  Lung cancer screening. / Every year if you are aged 69-80 years and have a 30-pack-year history of smoking and currently smoke or have quit within the past 15 years. Yearly screening is stopped once you have quit smoking for at least 15 years or develop a health problem that would prevent you from having lung cancer treatment.  Fecal occult blood test (FOBT) of stool. / Every year beginning at age 58 and continuing until age 31. You may not have to do this test if you get a colonoscopy every 10 years.  Flexible sigmoidoscopy** or colonoscopy.** / Every 5 years for a flexible sigmoidoscopy or every 10 years for a colonoscopy beginning at age 14 and continuing until age 31.  Hepatitis C blood test.** / For all people born from [birth year] through 1967 and any individual with known risks for hepatitis C.  Abdominal aortic aneurysm (AAA) screening.** / A one-time screening for ages 82 to 76 years who are current or former smokers.  Skin self-exam. / Monthly.  Influenza vaccine. / Every year.  Tetanus, diphtheria, and acellular pertussis (Tdap/Td) vaccine.** / 1 dose of Td every 10 years.  Varicella vaccine.** / Consult your health care provider.  Zoster vaccine.** / 1 dose for adults aged 40 years or older.  Pneumococcal 13-valent conjugate (PCV13) vaccine.** / Consult your health care provider.  Pneumococcal polysaccharide (PPSV23) vaccine.** / 1 dose for all adults aged 41 years and older.  Meningococcal vaccine.** /  Consult your health care provider.  Hepatitis A vaccine.** / Consult your health care provider.  Hepatitis B vaccine.** / Consult your health care provider.  Haemophilus influenzae type b (Hib) vaccine.** / Consult your health care provider. **Family history and personal history of risk and conditions may change your health care provider's recommendations. Document Released: 04/06/2001 Document Revised: 02/13/2013 Document Reviewed: 07/06/2010 [facility] Patient Information 2015 [place], [place]. This information is not intended to replace advice given to you by your health care provider. Make sure you discuss any questions you have with your health care provider.

## 2015-08-06 NOTE — Progress Notes (Signed)
Subjective:    Patient ID: Kevin Bass, male    DOB: [birth year], 75 y.o.   MRN: [id number]  DOS:  08/06/2015 Type of visit - description : CPX Interval history: No major concerns, good compliance with medications, diet is very healthy and he is trying to remain active   Review of Systems Constitutional: No fever. No chills. No unexplained wt changes. No unusual sweats  HEENT: No dental problems, no ear discharge, no facial swelling, no voice changes. No eye discharge, no eye  redness , no  intolerance to light   Respiratory: No wheezing , no  difficulty breathing. No cough , no mucus production  Cardiovascular: No CP, no leg swelling , no  Palpitations  GI: no nausea, no vomiting, no diarrhea , no  abdominal pain.  No blood in the stools. No dysphagia, no odynophagia    Endocrine: No polyphagia, no polyuria , no polydipsia  GU: No dysuria, gross hematuria, difficulty urinating. No urinary urgency, no frequency.  Musculoskeletal: No joint swellings or unusual aches or pains  Skin: No change in the color of the skin, palor , no  Rash  Allergic, immunologic: No environmental allergies , no  food allergies  Neurological: No dizziness no  syncope. No headaches. No diplopia, no slurred, no slurred speech, no motor deficits, no facial  Numbness  Hematological: No enlarged lymph nodes, no easy bruising , no unusual bleedings  Psychiatry: No suicidal ideas, no hallucinations, no beavior problems, no confusion.  No unusual/severe anxiety, no depression   Past Medical History  Diagnosis Date  . Colon cancer [facility])     s/p partial colectomy 1990, Cscope neg 7-09, next 2014  . DISORDER, MITRAL VALVE     MV recontruction at [facility] 2006----still needs ABX for SBE prophylaxis   . HYPERLIPIDEMIA 06/15/2006  . HYPERTROPHY PROSTATE W/O UR OBST & OTH LUTS 10/16/2008  . HYPERGLYCEMIA, BORDERLINE 10/16/2008  . Allergic rhinitis   . Heart murmur   . Elevated PSA     [name]  . Right  groin pain     Dr. Redmond Pulling    Past Surgical History  Procedure Laterality Date  . Colectomy  1990  . Mitral valve annuloplasty      [facility] 2006  . Hernia repair Left 2014    [facility] rep w/mesh- March 2014    Social History   Social History  . Marital Status: Married    Spouse Name: suzanne  . Number of Children: 2  . Years of Education: college   Occupational History  . retired     Social History Main Topics  . Smoking status: Former Smoker    Quit date: 02/23/1983  . Smokeless tobacco: Never Used  . Alcohol Use: Yes     Comment: wine sometimes   . Drug Use: No  . Sexual Activity: Not on file   Other Topics Concern  . Not on file   Social History Narrative   From [place], household pt and wife           Family History  Problem Relation Age of Onset  . Colon cancer Other     M side (cousins)  . Diabetes Neg Hx   . Stroke Neg Hx   . CAD Neg Hx   . Prostate cancer Neg Hx   . Rectal cancer Neg Hx        Medication List       This list is accurate as of: 08/06/15 11:59 PM.  Always use  your most recent med list.               ALPRAZolam 0.25 MG tablet  Commonly known as:  XANAX  Take 1-2 tablets (0.25-0.5 mg total) by mouth 2 (two) times daily as needed for anxiety.     ARGININE PO  Take 3 each by mouth daily.     aspirin 81 MG tablet  Take 81 mg by mouth daily.     FISH OIL PO  Take 1 tablet by mouth daily.     gabapentin 300 MG capsule  Commonly known as:  NEURONTIN  Take 300 mg by mouth at bedtime.     loratadine 10 MG tablet  Commonly known as:  CLARITIN  Take 10 mg by mouth daily as needed for allergies.     metoprolol 50 MG tablet  Commonly known as:  LOPRESSOR  Take 0.5 tablets (25 mg total) by mouth 2 (two) times daily.     NON FORMULARY  Blood pressure control supplement- takes 3 daily     OCUVITE PRESERVISION PO  Take 1 tablet by mouth daily.     PROSTATE HEALTH PO  Take 1 tablet by mouth daily.     RED YEAST RICE PO  Take 2  each by mouth daily.           Objective:   Physical Exam BP 124/68 mmHg  Pulse 56  Temp(Src) 97.9 F (36.6 C) (Oral)  Ht '5\' 10"'$  (1.778 m)  Wt 182 lb (82.555 kg)  BMI 26.11 kg/m2  SpO2 97%  General:   Well developed, well nourished . NAD.  Neck: No  thyromegaly , normal carotid pulses HEENT:  Normocephalic . Face symmetric, atraumatic Lungs:  CTA B Normal respiratory effort, no intercostal retractions, no accessory muscle use. Heart: RRR,  no murmur.  No pretibial edema bilaterally  Abdomen:  Not distended, soft, non-tender. No rebound or rigidity.   Skin: Exposed areas without rash. Not pale. Not jaundice Neurologic:  alert & oriented X3.  Speech normal, gait appropriate for age and unassisted Strength symmetric and appropriate for age.  Psych: Cognition and judgment appear intact.  Cooperative with normal attention span and concentration.  Behavior appropriate. No anxious or depressed appearing.    Assessment & Plan:   Assessment Prediabetes HTN Hyperlipidemia, statin intolerant  Anxiety- rarely takes xanax Facial neuropathy dx 2015, no w/u, sx resurface x1 after temporary d/c of meds , on gabapentin Mitral valve annuloplasty 2006-- needs SBE prophylaxis Elevated PSA and BPH-- Dr. [name] Colon cancer partial colectomy 1990, colonoscopy 08-2007 negative, Cscope again 09-2012 normal, 5 years   PLAN: Prediabetes: Check A1c HTN: on Lopressor, controlled. Facial neuropathy: On gabapentin, 300 mg at night, we talk about possibly discontinuing the medication but previously when he did that the sx resurface. We elected to continue gabapentin at night. RTC 6-8 months

## 2015-08-06 NOTE — Assessment & Plan Note (Addendum)
Td 2008;  Pneumonia shot 2006, 2012;  prevnar--2015  S/p Zostavax per pt , no documentation  Last Cscope-- 09-2012 neg, next 5 years  PSAs per urology  Diet-exercise discussed

## 2015-08-06 NOTE — Progress Notes (Signed)
Pre visit review using our clinic review tool, if applicable. No additional management support is needed unless otherwise documented below in the visit note. 

## 2015-08-07 NOTE — Assessment & Plan Note (Signed)
Prediabetes: Check A1c HTN: on Lopressor, controlled. Facial neuropathy: On gabapentin, 300 mg at night, we talk about possibly discontinuing the medication but previously when he did that the sx resurface. We elected to continue gabapentin at night. RTC 6-8 months

## 2015-08-08 ENCOUNTER — Encounter: Payer: Self-pay | Admitting: Internal Medicine

## 2015-08-08 MED ORDER — EZETIMIBE 10 MG PO TABS: 10.0000 mg | ORAL_TABLET | Freq: Every day | ORAL | Status: DC

## 2015-08-08 NOTE — Addendum Note (Signed)
Addended byDamita Dunnings D on: 08/08/2015 07:44 AM   Modules accepted: Orders

## 2015-08-10 ENCOUNTER — Encounter: Payer: Self-pay | Admitting: Internal Medicine

## 2015-09-03 ENCOUNTER — Encounter: Payer: Self-pay | Admitting: Internal Medicine

## 2015-09-03 NOTE — Telephone Encounter (Signed)
MyChart disabled by [place], Engineer, building services. Forwarding to management.

## 2015-09-05 ENCOUNTER — Other Ambulatory Visit: Payer: Self-pay | Admitting: Internal Medicine

## 2015-09-10 NOTE — Telephone Encounter (Signed)
Complete

## 2015-09-22 ENCOUNTER — Ambulatory Visit: Payer: Medicare Other | Admitting: Family

## 2015-09-22 ENCOUNTER — Ambulatory Visit (INDEPENDENT_AMBULATORY_CARE_PROVIDER_SITE_OTHER): Payer: Medicare Other | Admitting: Family

## 2015-09-22 ENCOUNTER — Encounter: Payer: Self-pay | Admitting: Family

## 2015-09-22 DIAGNOSIS — K219 Gastro-esophageal reflux disease without esophagitis: Secondary | ICD-10-CM | POA: Diagnosis not present

## 2015-09-22 MED ORDER — OMEPRAZOLE 40 MG PO CPDR: 40.0000 mg | DELAYED_RELEASE_CAPSULE | Freq: Every day | ORAL | 1 refills | Status: DC

## 2015-09-22 NOTE — Progress Notes (Signed)
Subjective:    Patient ID: Kevin Bass, male    DOB: 23-Feb-1940, 75 y.o.   MRN: [id number]  HPI  Kevin Bass is a 75 yr old male who presents today with report of burping x 4-5 days post prandially. He has switched to more bland foods but this has not really helped his symptoms.  Also using mylanta in AM and PM with slightl improvemet in his symptoms. No associated changes in bm's, denies diarrhea/constipation. Reports mild "sour" stomach today.  Denies black or bloody stools, except for one dark stool after using pepto bismol.   Review of Systems    see HPI  Past Medical History:  Diagnosis Date  . Allergic rhinitis   . Colon cancer [facility])    s/p partial colectomy 1990, Cscope neg 7-09, next 2014  . DISORDER, MITRAL VALVE    MV recontruction at [place] 2006----still needs ABX for SBE prophylaxis   . Elevated PSA    [name]  . Heart murmur   . HYPERGLYCEMIA, BORDERLINE 10/16/2008  . HYPERLIPIDEMIA 06/15/2006  . HYPERTROPHY PROSTATE W/O UR OBST & OTH LUTS 10/16/2008  . Right groin pain    Dr. Redmond Pulling     Social History   Social History  . Marital status: Married    Spouse name: suzanne  . Number of children: 2  . Years of education: college   Occupational History  . retired     Social History Main Topics  . Smoking status: Former Smoker    Quit date: 02/23/1983  . Smokeless tobacco: Never Used  . Alcohol use Yes     Comment: wine sometimes   . Drug use: No  . Sexual activity: Not on file   Other Topics Concern  . Not on file   Social History Narrative   From [place], household pt and wife          Past Surgical History:  Procedure Laterality Date  . COLECTOMY  1990  . HERNIA REPAIR Left 2014   [facility] rep w/mesh- March 2014  . MITRAL VALVE ANNULOPLASTY     [facility] 2006    Family History  Problem Relation Age of Onset  . Colon cancer Other     M side (cousins)  . Diabetes Neg Hx   . Stroke Neg Hx   . CAD Neg Hx   . Prostate cancer Neg Hx   . Rectal  cancer Neg Hx     Allergies  Allergen Reactions  . Statins     Other reaction(s): Myalgias (intolerance)    Current Outpatient Prescriptions on File Prior to Visit  Medication Sig Dispense Refill  . ALPRAZolam (XANAX) 0.25 MG tablet Take 1-2 tablets (0.25-0.5 mg total) by mouth 2 (two) times daily as needed for anxiety. 40 tablet 3  . ARGININE PO Take 3 each by mouth daily.      Marland Kitchen aspirin 81 MG tablet Take 81 mg by mouth daily.      Marland Kitchen gabapentin (NEURONTIN) 300 MG capsule Take 300 mg by mouth at bedtime.    Marland Kitchen loratadine (CLARITIN) 10 MG tablet Take 10 mg by mouth daily as needed for allergies.    . metoprolol (LOPRESSOR) 50 MG tablet Take 0.5 tablets (25 mg total) by mouth 2 (two) times daily. 90 tablet 2  . Misc Natural Products (PROSTATE HEALTH PO) Take 1 tablet by mouth daily.    . Multiple Vitamins-Minerals (OCUVITE PRESERVISION PO) Take 1 tablet by mouth daily.    . NON FORMULARY Blood  pressure control supplement- takes 3 daily    . Omega-3 Fatty Acids (FISH OIL PO) Take 1 tablet by mouth daily.    . Red Yeast Rice Extract (RED YEAST RICE PO) Take 2 each by mouth daily.       No current facility-administered medications on file prior to visit.     BP 118/82   Pulse 76   Temp 97.9 F (36.6 C) (Oral)   Resp 16   Ht '5\' 10"'$  (1.778 m)   Wt 179 lb 3.2 oz (81.3 kg)   SpO2 97% Comment: room air  BMI 25.71 kg/m    Objective:   Physical Exam  Constitutional: He is oriented to person, place, and time. He appears well-developed and well-nourished. No distress.  HENT:  Head: Normocephalic and atraumatic.  Cardiovascular: Normal rate and regular rhythm.   No murmur heard. Pulmonary/Chest: Effort normal and breath sounds normal. No respiratory distress. He has no wheezes. He has no rales.  Abdominal: Soft. Bowel sounds are decreased.  slight abdominal tenderness above the umbilicus, no guarding no rebound.  Neg epigastric and RUQ tenderness.   Mild abdominal distension.     Musculoskeletal: He exhibits no edema.  Neurological: He is alert and oriented to person, place, and time.  Skin: Skin is warm and dry.  Psychiatric: He has a normal mood and affect. His behavior is normal. Thought content normal.          Assessment & Plan:

## 2015-09-22 NOTE — Patient Instructions (Addendum)
Please work on a reflux diet (see below). Begin omeprazole (for acid) once daily. Call if you develop abdominal pain, recurrent nausea/vomitting, black/blood stools, new or worsening symptoms.   Food Choices for Gastroesophageal Reflux Disease, Adult When you have gastroesophageal reflux disease (GERD), the foods you eat and your eating habits are very important. Choosing the right foods can help ease the discomfort of GERD. WHAT GENERAL GUIDELINES DO I NEED TO FOLLOW?  Choose fruits, vegetables, whole grains, low-fat dairy products, and low-fat meat, fish, and poultry.  Limit fats such as oils, salad dressings, butter, nuts, and avocado.  Keep a food diary to identify foods that cause symptoms.  Avoid foods that cause reflux. These may be different for different people.  Eat frequent small meals instead of three large meals each day.  Eat your meals slowly, in a relaxed setting.  Limit fried foods.  Cook foods using methods other than frying.  Avoid drinking alcohol.  Avoid drinking large amounts of liquids with your meals.  Avoid bending over or lying down until 2-3 hours after eating. WHAT FOODS ARE NOT RECOMMENDED? The following are some foods and drinks that may worsen your symptoms: Vegetables Tomatoes. Tomato juice. Tomato and spaghetti sauce. Chili peppers. Onion and garlic. Horseradish. Fruits Oranges, grapefruit, and lemon (fruit and juice). Meats High-fat meats, fish, and poultry. This includes hot dogs, ribs, ham, sausage, salami, and bacon. Dairy Whole milk and chocolate milk. Sour cream. Cream. Butter. Ice cream. Cream cheese.  Beverages Coffee and tea, with or without caffeine. Carbonated beverages or energy drinks. Condiments Hot sauce. Barbecue sauce.  Sweets/Desserts Chocolate and cocoa. Donuts. Peppermint and spearmint. Fats and Oils High-fat foods, including [place] fries and potato chips. Other Vinegar. Strong spices, such as black pepper, white  pepper, red pepper, cayenne, curry powder, cloves, ginger, and chili powder. The items listed above may not be a complete list of foods and beverages to avoid. Contact your dietitian for more information.   This information is not intended to replace advice given to you by your health care provider. Make sure you discuss any questions you have with your health care provider.   Document Released: 02/08/2005 Document Revised: 03/01/2014 Document Reviewed: 12/13/2012 Elsevier Interactive Patient Education [employer].

## 2015-09-22 NOTE — Progress Notes (Signed)
Pre visit review using our clinic review tool, if applicable. No additional management support is needed unless otherwise documented below in the visit note. 

## 2015-09-22 NOTE — Assessment & Plan Note (Signed)
Symptoms most consistent with GERD. Advised pt on GERD diet. Will begin omeprazole. He is instructed to follow up with PCP in 1 month, call sooner if new/worsening symptoms.  Consider additional work up including GI referral if symptoms do not improve.

## 2015-09-29 ENCOUNTER — Ambulatory Visit (INDEPENDENT_AMBULATORY_CARE_PROVIDER_SITE_OTHER): Payer: Medicare Other | Admitting: Gastroenterology

## 2015-09-29 ENCOUNTER — Encounter: Payer: Self-pay | Admitting: Gastroenterology

## 2015-09-29 VITALS — BP 112/72 | HR 64 | Ht 70.0 in | Wt 180.0 lb

## 2015-09-29 DIAGNOSIS — R142 Eructation: Secondary | ICD-10-CM

## 2015-09-29 NOTE — Patient Instructions (Signed)
Please follow up with Dr Havery Moros as needed.  If you are age 77 or older, your body mass index should be between 23-30. Your Body mass index is 25.83 kg/m. If this is out of the aforementioned range listed, please consider follow up with your Primary Care Provider.  If you are age 73 or younger, your body mass index should be between 19-25. Your Body mass index is 25.83 kg/m. If this is out of the aformentioned range listed, please consider follow up with your Primary Care Provider.

## 2015-09-29 NOTE — Progress Notes (Signed)
HPI :  75 y/o male with a history of colon cancer diagnosed at age 64. Former patient of Dr. Sharlett Iles. He also has a history of HLD and mitral valve surgery.   Patient has had some symptoms of belching bothering him recently. He reports after he eats he has significant belching which bothers him. He has tried chaging his diet and avoiding trigger foods which he thinks has helped a little bit. HE denies any regurgitsation or heartburn. He denies any dysphagia. No odynophagia. No nausea or vomiting. He denies any early satiety. He reports the belching lasts 5-10 minutes after he eats and then he feels normal. Outside of eating he is not belching. He was given a trial of of prilosec '40mg'$  daily for about a week. He thinks this has helped by roughly 30% or so.   No weight changes. No new changes in his life or stressors otherwise.   Colonoscopy 10/18/2012 - normal, told to repeat in 5 years  Past Medical History:  Diagnosis Date  . Allergic rhinitis   . Colon cancer [facility])    s/p partial colectomy 1990, Cscope neg 7-09, next 2014  . DISORDER, MITRAL VALVE    MV recontruction at [facility] 2006----still needs ABX for SBE prophylaxis   . Elevated PSA    [name]  . Heart murmur   . HYPERGLYCEMIA, BORDERLINE 10/16/2008  . HYPERLIPIDEMIA 06/15/2006  . HYPERTROPHY PROSTATE W/O UR OBST & OTH LUTS 10/16/2008  . Right groin pain    Dr. Redmond Pulling     Past Surgical History:  Procedure Laterality Date  . COLECTOMY  1990  . HERNIA REPAIR Left 2014   [facility] rep w/mesh- March 2014  . MITRAL VALVE ANNULOPLASTY     [facility] 2006   Family History  Problem Relation Age of Onset  . Colon cancer Other     M side (cousins)  . Diabetes Neg Hx   . Stroke Neg Hx   . CAD Neg Hx   . Prostate cancer Neg Hx   . Rectal cancer Neg Hx    Social History  Substance Use Topics  . Smoking status: Former Smoker    Quit date: 02/23/1983  . Smokeless tobacco: Never Used  . Alcohol use Yes     Comment: wine sometimes     Current Outpatient Prescriptions  Medication Sig Dispense Refill  . ALPRAZolam (XANAX) 0.25 MG tablet Take 1-2 tablets (0.25-0.5 mg total) by mouth 2 (two) times daily as needed for anxiety. 40 tablet 3  . ARGININE PO Take 3 each by mouth daily.      Marland Kitchen aspirin 81 MG tablet Take 81 mg by mouth daily.      Marland Kitchen gabapentin (NEURONTIN) 300 MG capsule Take 300 mg by mouth at bedtime.    Marland Kitchen loratadine (CLARITIN) 10 MG tablet Take 10 mg by mouth daily as needed for allergies.    . metoprolol (LOPRESSOR) 50 MG tablet Take 0.5 tablets (25 mg total) by mouth 2 (two) times daily. 90 tablet 2  . Misc Natural Products (PROSTATE HEALTH PO) Take 1 tablet by mouth daily.    . Multiple Vitamins-Minerals (OCUVITE PRESERVISION PO) Take 1 tablet by mouth daily.    . NON FORMULARY Blood pressure control supplement- takes 3 daily    . Omega-3 Fatty Acids (FISH OIL PO) Take 1 tablet by mouth daily.    Marland Kitchen omeprazole (PRILOSEC) 40 MG capsule Take 1 capsule (40 mg total) by mouth daily. 30 capsule 1  . Red Yeast  Rice Extract (RED YEAST RICE PO) Take 2 each by mouth daily.       No current facility-administered medications for this visit.    Allergies  Allergen Reactions  . Statins     Other reaction(s): Myalgias (intolerance)     Review of Systems: All systems reviewed and negative except where noted in HPI.   Lab Results  Component Value Date   WBC 5.0 06/07/2014   HGB 15.2 06/07/2014   HCT 44.7 06/07/2014   MCV 82.9 06/07/2014   PLT 229.0 06/07/2014    Lab Results  Component Value Date   CREATININE 0.97 08/06/2015   BUN 17 08/06/2015   NA 139 08/06/2015   K 4.4 08/06/2015   CL 105 08/06/2015   CO2 30 08/06/2015   Lab Results  Component Value Date   ALT 17 08/06/2015   AST 18 08/06/2015   ALKPHOS 49 08/06/2015   BILITOT 0.5 08/06/2015     Physical Exam: BP 112/72   Pulse 64   Ht '5\' 10"'$  (1.778 m)   Wt 180 lb (81.6 kg)   BMI 25.83 kg/m  Constitutional: Pleasant,well-developed, male  in no acute distress. HEENT: Normocephalic and atraumatic. Conjunctivae are normal. No scleral icterus. Neck supple.  Cardiovascular: Normal rate, regular rhythm.  Pulmonary/chest: Effort normal and breath sounds normal. No wheezing, rales or rhonchi. Abdominal: Soft, nondistended, nontender. Bowel sounds active throughout. There are no masses palpable. No hepatomegaly. Extremities: no edema Lymphadenopathy: No cervical adenopathy noted. Neurological: Alert and oriented to person place and time. Skin: Skin is warm and dry. No rashes noted. Psychiatric: Normal mood and affect. Behavior is normal.   ASSESSMENT AND PLAN:  75 y/o male with a history of colon cancer diagnosed at age 7 and in remission, presenting with new onset belching for the past few weeks. No other more typical symptoms of reflux such as pyrosis and regurgitation. He has had "30%" benefit from prilosec but just started it a week ago.   I discussed belching with him and how this is usually does not represent any serious pathology. Supragastric belching is the most common type. If this really bothers him significantly, I offered him a trial of baclofen which is the most helpful medication for symptoms of solely belching, however after discussion of risks / benefits he did not think he would tolerate the sedating side effects. He wishes to continue prilosec for a few weeks to see if this continues to provide benefit as he thinks it has helped slightly. If symptoms are bothersome and continue to persist we will consider barium swallow or EGD, to assess anatomy, rule out large hiatal hernia. He will call me in a few weeks if he wishes to proceed with this. All questions answered.   Lenora Cellar, MD [facility] Pager [phone]

## 2015-10-07 ENCOUNTER — Telehealth: Payer: Self-pay | Admitting: Gastroenterology

## 2015-10-07 NOTE — Telephone Encounter (Signed)
Left message for patient to call back  

## 2015-10-07 NOTE — Telephone Encounter (Signed)
Patient reports continued bothersome belching.  He would like to proceed with the next step.  Per office note from 09/29/15 BS vs EGD.  Please advise

## 2015-10-10 NOTE — Telephone Encounter (Signed)
Patient notified of response.  He does wish to proceed with EGD.  He is scheduled for EGD/previsit for 10/20/15 and 10/13/15

## 2015-10-10 NOTE — Telephone Encounter (Signed)
Okay. Thanks for the update. I think an EGD is reasonable if symptoms are really bothering him, however counseled him that I suspect he has supragastric belching which is almost always a benign entity. If he wishes to proceed with EGD please schedule him. Thanks

## 2015-10-13 ENCOUNTER — Encounter: Payer: Self-pay | Admitting: Internal Medicine

## 2015-10-13 ENCOUNTER — Ambulatory Visit: Payer: Medicare Other | Admitting: *Deleted

## 2015-10-13 ENCOUNTER — Encounter: Payer: Self-pay | Admitting: Gastroenterology

## 2015-10-13 VITALS — Ht 69.0 in | Wt 178.4 lb

## 2015-10-13 DIAGNOSIS — R142 Eructation: Secondary | ICD-10-CM

## 2015-10-13 MED ORDER — ALPRAZOLAM 0.25 MG PO TABS: 0.2500 mg | ORAL_TABLET | Freq: Two times a day (BID) | ORAL | 3 refills | Status: DC | PRN

## 2015-10-13 NOTE — Telephone Encounter (Signed)
Rx printed, awaiting MD signature.  

## 2015-10-13 NOTE — Telephone Encounter (Signed)
Pt is requesting refill on Alprazolam.  Last OV: 08/06/2015  Last Fill: 06/14/2014 #40 and 3RF UDS: 04/03/2013 Low risk  Please advise.

## 2015-10-13 NOTE — Progress Notes (Signed)
Patient denies any allergies to egg or soy products. Patient denies complications with anesthesia/sedation.  Patient denies oxygen use at home and denies diet medications. Emmi instructions for endoscopy explained and pamphlet given to patient.

## 2015-10-13 NOTE — Telephone Encounter (Signed)
Rx faxed to [facility] pharmacy.  

## 2015-10-13 NOTE — Telephone Encounter (Signed)
Okay #40 and 3 refills  JP

## 2015-10-20 ENCOUNTER — Encounter: Payer: Medicare Other | Admitting: Gastroenterology

## 2015-10-31 ENCOUNTER — Telehealth: Payer: Self-pay | Admitting: Internal Medicine

## 2015-10-31 NOTE — Telephone Encounter (Signed)
Document put at front office tray.

## 2015-10-31 NOTE — Telephone Encounter (Signed)
Pt dropped off document for provider to fill out (Physician's Opinion Statement -[employer]) Pt would like to have it mailed to home address-pt left a white envelope with address already on it to be mailed.

## 2015-11-05 ENCOUNTER — Telehealth: Payer: Self-pay | Admitting: Internal Medicine

## 2015-11-05 ENCOUNTER — Encounter: Payer: Self-pay | Admitting: Internal Medicine

## 2015-11-05 LAB — POCT URINE DRUG SCREEN

## 2015-11-05 NOTE — Telephone Encounter (Signed)
Patient Name: Kevin Bass  DOB: [birth year]    Initial Comment [name] was taking a shower last night and was belching a lot. Then he had a headache. Light headed this morning    Nurse Assessment  Nurse: Mallie Mussel, RN, Alveta Heimlich Date/Time Eilene Ghazi Time): 11/05/2015 9:26:18 AM  Confirm and document reason for call. If symptomatic, describe symptoms. You must click the next button to save text entered. ---[name] states that he has a headache that began yesterday. Tylenol has helped with the headache pain. He currently rates the pain as 3-4 on 0-10 scale. He still has some dizziness which began last night also, but not as bad. He is able to stand and walk without holding onto items. He is scheduled for an EGD on 11/20/15.  Has the patient traveled out of the country within the last 30 days? ---No  Does the patient have any new or worsening symptoms? ---Yes  Will a triage be completed? ---Yes  Related visit to physician within the last 2 weeks? ---No  Does the PT have any chronic conditions? (i.e. diabetes, asthma, etc.) ---Yes  List chronic conditions. ---HTN,  Is this a behavioral health or substance abuse call? ---No     Guidelines    Guideline Title Affirmed Question Affirmed Notes  Dizziness - Lightheadedness [1] MODERATE dizziness (e.g., interferes with normal activities) AND [2] has NOT been evaluated by physician for this (Exception: dizziness caused by heat exposure, sudden standing, or poor fluid intake)    Final Disposition User   See Physician within [place], RN, [employer] states that they had already scheduled him for tomorrow morning at 10:45am tomorrow morning. He states that he was told that there was an opening at 8:30am. He wants to change the appointment for tomorrow to the 8:30am and cancel the 10:45am. Appointment scheduled for 8:30am tomorrow morning with Dr. Kathlene November.  We had finally slowed down enough for me to do the cancellation of the 10:45am  appointment for tomorrow but it had already been cancelled.   Referrals  REFERRED TO PCP OFFICE   Disagree/Comply: Comply

## 2015-11-05 NOTE — Telephone Encounter (Signed)
Pt called in to schedule an appt. Pt says that he has experienced some dizziness and weakness the previous day. Offered today's appt times pt says that he ONLY want to see his PCP. Pt's PCP doesn't  Currently have any opening for today. Pt would like to wait for available appt. - 9/14 w/pcp. Scheduled appt -- ALSO, transferred pt to [facility] to speak with a nurse.

## 2015-11-06 ENCOUNTER — Encounter: Payer: Self-pay | Admitting: Internal Medicine

## 2015-11-06 ENCOUNTER — Ambulatory Visit (INDEPENDENT_AMBULATORY_CARE_PROVIDER_SITE_OTHER): Payer: Medicare Other | Admitting: Internal Medicine

## 2015-11-06 ENCOUNTER — Ambulatory Visit: Payer: Self-pay | Admitting: Internal Medicine

## 2015-11-06 VITALS — BP 124/80 | HR 58 | Temp 98.2°F | Resp 14 | Ht 70.0 in | Wt 180.1 lb

## 2015-11-06 DIAGNOSIS — R42 Dizziness and giddiness: Secondary | ICD-10-CM | POA: Diagnosis not present

## 2015-11-06 DIAGNOSIS — F419 Anxiety disorder, unspecified: Secondary | ICD-10-CM

## 2015-11-06 NOTE — Progress Notes (Signed)
Subjective:    Patient ID: Kevin Bass, male    DOB: [birth year], 75 y.o.   MRN: [id number]  DOS:  11/06/2015 Type of visit - description : acute Interval history: Patient was taking a shower 2 days ago, suddenly developed dizziness described as spinning. He went to lay down, felt some better, he stood up and still was feeling dizzy so he laid down for about an hour and then he definitely improved. I asked if the sx increased  when turning his  head but he states he did not attempt to do that. At this point he is essentially asymptomatic. On looking back he had mild nausea when he had dizziness. Is not taking any new medication, except for Prilosec for a few weeks. He did eat a very salty salmon 1-2 d   before the symptoms started and wonders if that may have triggered the symptoms.  Also, has ongoing problems with belching.  Takes Xanax sporadically , needs a contract and a UDS   Review of Systems  Denies fever chills. No recent [name] No chest pain or palpitations No diplopia, slurred speech, motor deficits, facial numbness. Mild headache the day he had dizziness, headache resolved.   Past Medical History:  Diagnosis Date  . Allergy   . Anxiety   . Arthritis    hands  . Colon cancer [facility])    s/p partial colectomy 1990, Cscope neg 7-09, next 2014  . DISORDER, MITRAL VALVE    MV recontruction at [place] 2006----still needs ABX for SBE prophylaxis   . Elevated PSA    [name]  . GERD (gastroesophageal reflux disease)   . Heart murmur    no problems since surgery  . HYPERGLYCEMIA, BORDERLINE 10/16/2008  . HYPERLIPIDEMIA 06/15/2006   diet controlled  . HYPERTROPHY PROSTATE W/O UR OBST & OTH LUTS 10/16/2008  . Right groin pain    Dr. Redmond Pulling  . Wears partial dentures    lower partial    Past Surgical History:  Procedure Laterality Date  . COLECTOMY  1990  . COLONOSCOPY    . HERNIA REPAIR Left 2014   [facility] rep w/mesh- March 2014  . MITRAL VALVE ANNULOPLASTY     [facility] 2006     Social History   Social History  . Marital status: Married    Spouse name: suzanne  . Number of children: 2  . Years of education: college   Occupational History  . retired     Social History Main Topics  . Smoking status: Former Smoker    Packs/day: 0.50    Years: 20.00    Types: Cigarettes    Quit date: 02/23/1983  . Smokeless tobacco: Never Used  . Alcohol use 4.2 oz/week    7 Glasses of wine per week  . Drug use: No  . Sexual activity: Not on file   Other Topics Concern  . Not on file   Social History Narrative   From [place], household pt and wife              Medication List       Accurate as of 11/06/15  7:38 PM. Always use your most recent med list.          ALPRAZolam 0.25 MG tablet Commonly known as:  XANAX Take 1-2 tablets (0.25-0.5 mg total) by mouth 2 (two) times daily as needed for anxiety.   ARGININE PO Take 3 each by mouth daily.   aspirin 81 MG tablet Take 81 mg by  mouth daily.   gabapentin 300 MG capsule Commonly known as:  NEURONTIN Take 300 mg by mouth at bedtime.   loratadine 10 MG tablet Commonly known as:  CLARITIN Take 10 mg by mouth daily as needed for allergies.   metoprolol 50 MG tablet Commonly known as:  LOPRESSOR Take 0.5 tablets (25 mg total) by mouth 2 (two) times daily.   NON FORMULARY Blood pressure control supplement- takes 3 daily   OCUVITE PRESERVISION PO Take 1 tablet by mouth daily.   omeprazole 40 MG capsule Commonly known as:  PRILOSEC Take 1 capsule (40 mg total) by mouth daily.   PROSTATE HEALTH PO Take 1 tablet by mouth daily.   RED YEAST RICE PO Take 2 each by mouth daily.          Objective:   Physical Exam BP 124/80 (BP Location: Left Arm, Patient Position: Sitting, Cuff Size: Normal)   Pulse (!) 58   Temp 98.2 F (36.8 C) (Oral)   Resp 14   Ht '5\' 10"'$  (1.778 m)   Wt 180 lb 2 oz (81.7 kg)   SpO2 98%   BMI 25.85 kg/m  General:   Well developed, well nourished . NAD.  HEENT:    Normocephalic . Face symmetric, atraumatic. Neck: Normal carotid pulses Lungs:  CTA B Normal respiratory effort, no intercostal retractions, no accessory muscle use. Heart: RRR,  no murmur.  No pretibial edema bilaterally  Skin: Not pale. Not jaundice Neurologic:  alert & oriented X3.  Speech normal, gait appropriate for age and unassisted Motor symmetric, face symmetric, DTRs symmetric (decreased at both ankles) EOMI, pupils equal and reactive Romberg absent Psych--  Cognition and judgment appear intact.  Cooperative with normal attention span and concentration.  Behavior appropriate. No anxious or depressed appearing.      Assessment & Plan:   Assessment Prediabetes HTN Hyperlipidemia, statin intolerant  Anxiety- rarely takes xanax Facial neuropathy dx 2015, no w/u, sx resurface x1 after temporary d/c of meds , on gabapentin Mitral valve annuloplasty 2006-- needs SBE prophylaxis Elevated PSA and BPH-- Dr. [name] Colon cancer partial colectomy 1990, colonoscopy 08-2007 negative, Cscope again 09-2012 normal, 5 years   PLAN: Dizziness: Episode of dizziness as described above, sx are nonspecific, we discussed further eval with MRI versus observation, we agreed on observation; ER visit or cal  office if sx resurface or if he has any stroke symptoms.. Encourage rest and good hydration. 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 RTC 02-2016 as a schedule

## 2015-11-06 NOTE — Progress Notes (Signed)
Pre visit review using our clinic review tool, if applicable. No additional management support is needed unless otherwise documented below in the visit note. 

## 2015-11-06 NOTE — Patient Instructions (Addendum)
Please provide a urine sample  Rest, drink plenty of fluids  If you have more dizziness or any worrisome symptoms such as slurred speech, double vision, face numbness, severe headache: Call or go to the ER

## 2015-11-06 NOTE — Assessment & Plan Note (Signed)
Dizziness: Episode of dizziness as described above, sx are nonspecific, we discussed further eval with MRI versus observation, we agreed on observation; ER visit or cal  office if sx resurface or if he has any stroke symptoms.. Encourage rest and good hydration. 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 RTC 02-2016 as a schedule

## 2015-11-12 ENCOUNTER — Encounter: Payer: Self-pay | Admitting: Internal Medicine

## 2015-11-12 NOTE — Telephone Encounter (Signed)
Form w/ PCP for completion.

## 2015-11-12 NOTE — Telephone Encounter (Signed)
Form completed, except for vision section. Pt informed via MyChart that his Ophthalmologist will need to complete. Form placed in mail back to Pt w/ envelope provided. Copy sent for scanning.

## 2015-11-12 NOTE — Telephone Encounter (Signed)
Form w/ PCP.  

## 2015-11-14 ENCOUNTER — Telehealth: Payer: Self-pay

## 2015-11-14 NOTE — Telephone Encounter (Signed)
UDS: 11/05/2015  Negative for Alprazolam:PRN  Low risk per Dr. [name] 11/14/2015

## 2015-11-20 ENCOUNTER — Ambulatory Visit (AMBULATORY_SURGERY_CENTER): Payer: Medicare Other | Admitting: Gastroenterology

## 2015-11-20 ENCOUNTER — Encounter: Payer: Self-pay | Admitting: Gastroenterology

## 2015-11-20 VITALS — BP 110/72 | HR 51 | Temp 97.5°F | Resp 13

## 2015-11-20 DIAGNOSIS — K2950 Unspecified chronic gastritis without bleeding: Secondary | ICD-10-CM | POA: Diagnosis not present

## 2015-11-20 DIAGNOSIS — R142 Eructation: Secondary | ICD-10-CM

## 2015-11-20 MED ORDER — SODIUM CHLORIDE 0.9 % IV SOLN: 500.0000 mL | INTRAVENOUS | Status: DC

## 2015-11-20 NOTE — Progress Notes (Signed)
Spontaneous respirations throughout. VSS. Resting comfortably. To PACU on room air. Report to  Penny RN.  

## 2015-11-20 NOTE — Op Note (Signed)
[facility] Patient Name: Kevin Bass Procedure Date: 11/20/2015 10:05 AM MRN: [id number] Endoscopist: Remo Lipps P. Havery Moros , MD Age: 75 Referring MD:  Date of Birth: October 25, 1940 Gender: Male Account #: [id number] Procedure:                Upper GI endoscopy Indications:              persistent belching Medicines:                Monitored Anesthesia Care Procedure:                Pre-Anesthesia Assessment:                           - Prior to the procedure, a History and Physical                            was performed, and patient medications and                            allergies were reviewed. The patient's tolerance of                            previous anesthesia was also reviewed. The risks                            and benefits of the procedure and the sedation                            options and risks were discussed with the patient.                            All questions were answered, and informed consent                            was obtained. Prior Anticoagulants: The patient has                            taken aspirin, last dose was 1 day prior to                            procedure. ASA Grade Assessment: III - A patient                            with severe systemic disease. After reviewing the                            risks and benefits, the patient was deemed in                            satisfactory condition to undergo the procedure.                           After obtaining informed consent, the endoscope was  passed under direct vision. Throughout the                            procedure, the patient's blood pressure, pulse, and                            oxygen saturations were monitored continuously. The                            Model GIF-HQ190 [id number]) scope was introduced                            through the mouth, and advanced to the second part                            of duodenum. The upper  GI endoscopy was                            accomplished without difficulty. The patient                            tolerated the procedure well. Scope In: Scope Out: Findings:                 Esophagogastric landmarks were identified: the                            Z-line was found at 42 cm, the gastroesophageal                            junction was found at 42 cm and the upper extent of                            the gastric folds was found at 42 cm from the                            incisors.                           The exam of the esophagus was otherwise normal.                           Patchy mildly erythematous mucosa was found in the                            gastric body and in the gastric antrum without                            obvious ulceration or erosion. Biopsies were taken                            with a cold forceps for Helicobacter pylori testing.  The exam of the stomach was otherwise normal.                           The duodenal bulb and second portion of the                            duodenum were normal. Complications:            No immediate complications. Estimated blood loss:                            Minimal. Estimated Blood Loss:     Estimated blood loss was minimal. Impression:               - Esophagogastric landmarks identified.                           - Normal esophagus                           - Erythematous mucosa in the gastric body and                            antrum. Biopsied to rule out H pylori.                           - Normal duodenal bulb and second portion of the                            duodenum.                           Overall, no large hiatal hernia or other pathology                            noted, suspect symptoms are due to supragastric                            belching (benign) Recommendation:           - Patient has a contact number available for                            emergencies.  The signs and symptoms of potential                            delayed complications were discussed with the                            patient. Return to normal activities tomorrow.                            Written discharge instructions were provided to the                            patient.                           -  Resume previous diet.                           - Continue present medications.                           - Await pathology results. Remo Lipps P. Havery Moros, MD 11/20/2015 10:23:29 AM This report has been signed electronically.

## 2015-11-20 NOTE — Patient Instructions (Signed)
YOU HAD AN ENDOSCOPIC PROCEDURE TODAY AT [facility]:   Refer to the procedure report that was given to you for any specific questions about what was found during the examination.  If the procedure report does not answer your questions, please call your gastroenterologist to clarify.  If you requested that your care partner not be given the details of your procedure findings, then the procedure report has been included in a sealed envelope for you to review at your convenience later.  YOU SHOULD EXPECT: Some feelings of bloating in the abdomen. Passage of more gas than usual.  Walking can help get rid of the air that was put into your GI tract during the procedure and reduce the bloating. If you had a lower endoscopy (such as a colonoscopy or flexible sigmoidoscopy) you may notice spotting of blood in your stool or on the toilet paper. If you underwent a bowel prep for your procedure, you may not have a normal bowel movement for a few days.  Please Note:  You might notice some irritation and congestion in your nose or some drainage.  This is from the oxygen used during your procedure.  There is no need for concern and it should clear up in a day or so.  SYMPTOMS TO REPORT IMMEDIATELY:     Following upper endoscopy (EGD)  Vomiting of blood or coffee ground material  New chest pain or pain under the shoulder blades  Painful or persistently difficult swallowing  New shortness of breath  Fever of 100F or higher  Black, tarry-looking stools  For urgent or emergent issues, a gastroenterologist can be reached at any hour by calling [phone].   DIET:  We do recommend a small meal at first, but then you may proceed to your regular diet.  Drink plenty of fluids but you should avoid alcoholic beverages for 24 hours.  ACTIVITY:  You should plan to take it easy for the rest of today and you should NOT DRIVE or use heavy machinery until tomorrow (because of the sedation medicines  used during the test).    FOLLOW UP: Our staff will call the number listed on your records the next business day following your procedure to check on you and address any questions or concerns that you may have regarding the information given to you following your procedure. If we do not reach you, we will leave a message.  However, if you are feeling well and you are not experiencing any problems, there is no need to return our call.  We will assume that you have returned to your regular daily activities without incident.  If any biopsies were taken you will be contacted by phone or by letter within the next 1-3 weeks.  Please call us at [phone] if you have not heard about the biopsies in 3 weeks.    SIGNATURES/CONFIDENTIALITY: You and/or your care partner have signed paperwork which will be entered into your electronic medical record.  These signatures attest to the fact that that the information above on your After Visit Summary has been reviewed and is understood.  Full responsibility of the confidentiality of this discharge information lies with you and/or your care-partner.   AWAIT PATHOLOGY RESULTS IN A LETTER FROM DR. ARMBRUSTER

## 2015-11-21 ENCOUNTER — Telehealth: Payer: Self-pay | Admitting: *Deleted

## 2015-11-21 NOTE — Telephone Encounter (Signed)
  Follow up Call-  Call back number 11/20/2015  Post procedure Call Back phone  # [phone]  Permission to leave phone message Yes  Some recent data might be hidden     Patient questions:  Message left to call us if necessary.

## 2015-11-24 ENCOUNTER — Telehealth: Payer: Self-pay

## 2015-11-24 NOTE — Telephone Encounter (Signed)
  Follow up Call-  Call back number 11/20/2015  Post procedure Call Back phone  # [phone]  Permission to leave phone message Yes  Some recent data might be hidden     Patient questions:  Do you have a fever, pain , or abdominal swelling? No. Pain Score  0 *  Have you tolerated food without any problems? Yes.    Have you been able to return to your normal activities? Yes.    Do you have any questions about your discharge instructions: Diet   No. Medications  No. Follow up visit  No.  Do you have questions or concerns about your Care? No.  Actions: * If pain score is 4 or above: No action needed, pain <4.

## 2015-11-26 ENCOUNTER — Telehealth: Payer: Self-pay | Admitting: Gastroenterology

## 2015-11-27 NOTE — Telephone Encounter (Signed)
Discussed with pt that his pathology is not back yet from his procedure and we will either call or send him a letter when results are back.

## 2015-12-03 ENCOUNTER — Encounter: Payer: Self-pay | Admitting: Gastroenterology

## 2015-12-04 ENCOUNTER — Encounter: Payer: Self-pay | Admitting: Internal Medicine

## 2015-12-04 ENCOUNTER — Encounter: Payer: Self-pay | Admitting: Gastroenterology

## 2015-12-04 ENCOUNTER — Telehealth: Payer: Self-pay | Admitting: Gastroenterology

## 2015-12-04 NOTE — Telephone Encounter (Signed)
Almyra Free can you please call this patient and relay results of his path letter to him. The letter was just mailed to him yesterday he did not get it and has emailed about his results. Please let him know there was a delay in pathology results which is why he has not yet heard from [place]. Thanks

## 2015-12-05 NOTE — Telephone Encounter (Signed)
Spoke to patient, let him know what the letter stated re: egd results. Let him know that he should be getting this letter in the mail in the next few days.

## 2015-12-15 ENCOUNTER — Encounter: Payer: Self-pay | Admitting: Family Medicine

## 2015-12-15 ENCOUNTER — Ambulatory Visit (INDEPENDENT_AMBULATORY_CARE_PROVIDER_SITE_OTHER): Payer: Medicare Other | Admitting: Family Medicine

## 2015-12-15 VITALS — BP 106/70 | HR 68 | Temp 97.7°F | Ht 70.0 in | Wt 182.0 lb

## 2015-12-15 DIAGNOSIS — I951 Orthostatic hypotension: Secondary | ICD-10-CM | POA: Diagnosis not present

## 2015-12-15 NOTE — Patient Instructions (Addendum)

## 2015-12-15 NOTE — Progress Notes (Signed)
Chief Complaint  Patient presents with  . Nausea    Pt reports light headed and dizziness with belching and nausea/Pt reports when he woke up this morning it felt like the room was spinning and loss of appetite    Kevin Bass is 75 y.o. pt here for dizziness.  Duration: 3 days  Lasts for a few minutes- gets it when he rises for a laying position only Has nausea associated with it, no vomiting, has not been eating as much due to concern for making it worsen. No abdominal pain. Pass out? No Spinning? No Recent illness/fever? No Headache? Yes  Neurologic signs? No Change in PO intake? Yes  ROS:  Neuro: As noted in HPI Eyes: No vision changes  Past Medical History:  Diagnosis Date  . Allergy   . Anxiety   . Arthritis    hands  . Colon cancer [facility])    s/p partial colectomy 1990, Cscope neg 7-09, next 2014  . DISORDER, MITRAL VALVE    MV recontruction at [facility] 2006----still needs ABX for SBE prophylaxis   . Elevated PSA    [name]  . GERD (gastroesophageal reflux disease)   . Heart murmur    no problems since surgery  . HYPERGLYCEMIA, BORDERLINE 10/16/2008  . HYPERLIPIDEMIA 06/15/2006   diet controlled  . HYPERTROPHY PROSTATE W/O UR OBST & OTH LUTS 10/16/2008  . Right groin pain    Dr. Redmond Pulling  . Wears partial dentures    lower partial    Family History  Problem Relation Age of Onset  . Colon cancer Other     M side (cousins)  . Diabetes Neg Hx   . Stroke Neg Hx   . CAD Neg Hx   . Prostate cancer Neg Hx   . Rectal cancer Neg Hx   . Esophageal cancer Neg Hx       Medication List       Accurate as of 12/15/15 11:26 AM. Always use your most recent med list.          ALPRAZolam 0.25 MG tablet Commonly known as:  XANAX Take 1-2 tablets (0.25-0.5 mg total) by mouth 2 (two) times daily as needed for anxiety.   ARGININE PO Take 3 each by mouth daily.   aspirin 81 MG tablet Take 81 mg by mouth daily.   gabapentin 300 MG capsule Commonly known as:   NEURONTIN Take 300 mg by mouth at bedtime.   loratadine 10 MG tablet Commonly known as:  CLARITIN Take 10 mg by mouth daily as needed for allergies.   metoprolol 50 MG tablet Commonly known as:  LOPRESSOR Take 0.5 tablets (25 mg total) by mouth 2 (two) times daily.   NON FORMULARY Blood pressure control supplement- takes 3 daily   OCUVITE PRESERVISION PO Take 1 tablet by mouth daily.   omeprazole 40 MG capsule Commonly known as:  PRILOSEC Take 1 capsule (40 mg total) by mouth daily.   PROSTATE HEALTH PO Take 1 tablet by mouth daily.   RED YEAST RICE PO Take 2 each by mouth daily.       BP 106/70 (BP Location: Left Arm, Patient Position: Sitting, Cuff Size: Small)   Pulse 68   Temp 97.7 F (36.5 C) (Oral)   Ht '5\' 10"'$  (1.778 m)   Wt 182 lb (82.6 kg)   SpO2 98%   BMI 26.11 kg/m  General: Awake, alert, appears stated age Eyes: PERRLA, EOMi Ears: Patent, TM's neg b/l Heart: RRR, no murmurs, no  carotid bruits Lungs: CTAB, no accessory muscle use MSK: 5/5 strength throughout, gait normal Abd: Mild distension, BS+, no TTP, no masses Neuro: No cerebellar signs, patellar reflex 1/4 b/l wo clonus, calcaneal reflex 1/4 b/l wo clonus, biceps reflex 1/4 b/l wo clonus; Dix-Hall-Pike negative. Psych: Age appropriate judgment and insight, normal mood and affect  Orthostasis  Offered something for nausea, but he declined. Written info about orthostasis provided. Start drinking more fluids and keep up PO intake. F/u prn. Pt voiced understanding and agreement to the plan.  [place], DO 12/15/15 11:26 AM

## 2015-12-15 NOTE — Progress Notes (Signed)
Pre visit review using our clinic review tool, if applicable. No additional management support is needed unless otherwise documented below in the visit note. 

## 2015-12-22 ENCOUNTER — Other Ambulatory Visit: Payer: Self-pay | Admitting: Family

## 2016-02-18 ENCOUNTER — Telehealth: Payer: Self-pay

## 2016-02-18 MED ORDER — ALPRAZOLAM 0.25 MG PO TABS: 0.2500 mg | ORAL_TABLET | Freq: Two times a day (BID) | ORAL | 1 refills | Status: DC | PRN

## 2016-02-18 NOTE — Telephone Encounter (Signed)
Rx faxed to [facility] pharmacy.  

## 2016-02-18 NOTE — Telephone Encounter (Signed)
Rx printed, awaiting MD signature.  

## 2016-02-18 NOTE — Telephone Encounter (Signed)
Pt is requesting refill on Alprazolam.  Last OV: 08/06/2015 Last Fill: 10/13/2015 #40 and 3RF (Pt sig: 1-2 tabs BID PRN) UDS: 11/05/2015 Low risk  Please advise.

## 2016-02-18 NOTE — Telephone Encounter (Signed)
Okay 40, 1 refill

## 2016-02-25 ENCOUNTER — Telehealth: Payer: Self-pay | Admitting: Internal Medicine

## 2016-02-25 NOTE — Telephone Encounter (Signed)
01/16/14 PR PPPS, SUBSEQ VISIT [[id number]] patient scheduled for 03/02/16 with Hoyle Sauer at 11:30am and follow up with PCP at 1:15pm.

## 2016-03-02 ENCOUNTER — Encounter: Payer: Self-pay | Admitting: Internal Medicine

## 2016-03-02 ENCOUNTER — Ambulatory Visit (INDEPENDENT_AMBULATORY_CARE_PROVIDER_SITE_OTHER): Payer: PPO | Admitting: Internal Medicine

## 2016-03-02 ENCOUNTER — Ambulatory Visit: Payer: Medicare Other | Admitting: *Deleted

## 2016-03-02 VITALS — BP 118/76 | HR 99 | Temp 97.8°F | Resp 14 | Ht 70.0 in | Wt 181.0 lb

## 2016-03-02 DIAGNOSIS — Z0000 Encounter for general adult medical examination without abnormal findings: Secondary | ICD-10-CM | POA: Diagnosis not present

## 2016-03-02 LAB — TSH: TSH: 1.94 u[IU]/mL (ref 0.35–4.50)

## 2016-03-02 LAB — CBC WITH DIFFERENTIAL/PLATELET
BASOS ABS: 0 10*3/uL (ref 0.0–0.1)
Basophils Relative: 0.6 % (ref 0.0–3.0)
EOS ABS: 0.1 10*3/uL (ref 0.0–0.7)
Eosinophils Relative: 2.2 % (ref 0.0–5.0)
HCT: 43 % (ref 39.0–52.0)
Hemoglobin: 14.9 g/dL (ref 13.0–17.0)
LYMPHS ABS: 1.8 10*3/uL (ref 0.7–4.0)
LYMPHS PCT: 29.6 % (ref 12.0–46.0)
MCHC: 34.7 g/dL (ref 30.0–36.0)
MCV: 82.7 fl (ref 78.0–100.0)
MONOS PCT: 5.7 % (ref 3.0–12.0)
Monocytes Absolute: 0.4 10*3/uL (ref 0.1–1.0)
NEUTROS ABS: 3.9 10*3/uL (ref 1.4–7.7)
NEUTROS PCT: 61.9 % (ref 43.0–77.0)
PLATELETS: 224 10*3/uL (ref 150.0–400.0)
RBC: 5.2 Mil/uL (ref 4.22–5.81)
RDW: 13.4 % (ref 11.5–15.5)
WBC: 6.2 10*3/uL (ref 4.0–10.5)

## 2016-03-02 NOTE — Assessment & Plan Note (Addendum)
Td 2017 per pt;  Pneumonia shot 2006, 2012;  prevnar--2015  Declined a flu shot  S/p Zostavax per pt , no documentation  Last Cscope-- 09-2012 neg, next 5 years  PSAs per urology Labs: CBC, TSH Diet-exercise discussed

## 2016-03-02 NOTE — Assessment & Plan Note (Signed)
PLAN: Prediabetes: Last A1c satisfactory, doing well with diet and exercise HTN: On beta blockers, BP is very good. Last BMP satisfactory Hyperlipidemia:  Based on last results, was prescribed Zetia, apparently never tried. Explained patient Zetia is not a statin, encouraged him to let me know if he ever decides to try medication. Facial neuropathy: Now asx, off gabapentin as of 02-2016 To have a Medicare wellness today RTC 6-8 months

## 2016-03-02 NOTE — Progress Notes (Signed)
Subjective:   Kevin Bass is a 76 y.o. male who presents for Medicare Annual/Subsequent preventive examination.  Review of Systems:  No ROS.  Medicare Wellness Visit.  Cardiac Risk Factors include: advanced age (>86mn, >>10women);dyslipidemia;hypertension;male gender  Sleep patterns: no sleep issues, feels rested on waking, gets up 1 times nightly or less to void and 6.5-7 hours nightly.   Home Safety/Smoke Alarms: Feels safe in home. Smoke alarms in place.   Living environment; residence and Firearm Safety: Lives w/ wife.  2-story house, no firearms. Seat Belt Safety/Bike Helmet: Wears seat belt.   Counseling:   Eye Exam- Dr. RAntionette [facility]- every 6 months. Partial plates lower.   Male:   CCS- last 10/18/12 w/ Dr. DVerl Blalock Surgical anastomosis, otherwise normal. 5 year recall.     PSA- Follows w/ Dr. BAlinda Moneyyearly. Lab Results  Component Value Date   PSA 3.3 01/28/2009   PSA 3.7 07/31/2008   PSA 3.4 11/07/2007      Objective:    Vitals: BP 118/76 (BP Location: Left Arm, Patient Position: Sitting, Cuff Size: Normal)   Pulse 99   Temp 97.8 F (36.6 C) (Oral)   Resp 14   Ht '5\' 10"'$  (1.778 m)   Wt 181 lb (82.1 kg)   SpO2 96%   BMI 25.97 kg/m   Body mass index is 25.97 kg/m.  Tobacco History  Smoking Status  . Former Smoker  . Packs/day: 0.50  . Years: 20.00  . Types: Cigarettes  . Quit date: 02/23/1983  Smokeless Tobacco  . Never Used     Counseling given: No   Past Medical History:  Diagnosis Date  . Allergy   . Anxiety   . Arthritis    hands  . [facility]    s/p partial colectomy 1990, Cscope neg 7-09, next 2014  . DISORDER, MITRAL VALVE    [facility]still needs ABX for SBE prophylaxis   . Elevated PSA    Dr BAlinda Money . GERD (gastroesophageal reflux disease)   . Heart murmur    no problems since surgery  . HYPERGLYCEMIA, BORDERLINE 10/16/2008  . HYPERLIPIDEMIA 06/15/2006   diet controlled  .  HYPERTROPHY PROSTATE W/O UR OBST & OTH LUTS 10/16/2008  . Right groin pain    Dr. WRedmond Pulling . Wears partial dentures    lower partial   Past Surgical History:  Procedure Laterality Date  . COLECTOMY  1990  . COLONOSCOPY    . [facility]  . MITRAL VALVE [facility]UL[facility]LASTY     [facility]   Family History  Problem Relation Age of Onset  . Hypertension Mother   . Hypertension Sister   . Hypertension Brother   . Colon cancer Other     M side (cousins)  . Diabetes Neg Hx   . Stroke Neg Hx   . CAD Neg Hx   . Prostate cancer Neg Hx   . Rectal cancer Neg Hx   . Esophageal cancer Neg Hx    History  Sexual Activity  . Sexual activity: Not on file    Outpatient Encounter Prescriptions as of 03/02/2016  Medication Sig  . ALPRAZolam (XANAX) 0.25 MG tablet Take 1-2 tablets (0.25-0.5 mg total) by mouth 2 (two) times daily as needed for anxiety.  . ARGININE PO Take 3 each by mouth daily.    .Marland Kitchenaspirin 81 MG tablet Take 81 mg by mouth daily.    .Marland Kitchen  loratadine (CLARITIN) 10 MG tablet Take 10 mg by mouth daily as needed for allergies.  . metoprolol (LOPRESSOR) 50 MG tablet Take 0.5 tablets (25 mg total) by mouth 2 (two) times daily.  . Misc Natural Products (PROSTATE HEALTH PO) Take 1 tablet by mouth daily.  . Multiple Vitamins-Minerals (OCUVITE PRESERVISION PO) Take 1 tablet by mouth daily.  . NON FORMULARY Blood pressure control supplement- takes 3 daily  . omeprazole (PRILOSEC) 40 MG capsule TAKE 1 CAPSULE BY MOUTH DAILY.  . Red Yeast Rice Extract (RED YEAST RICE PO) Take 2 each by mouth daily.    . [DISCONTINUED] gabapentin (NEURONTIN) 300 MG capsule Take 300 mg by mouth at bedtime.   Facility-Administered Encounter Medications as of 03/02/2016  Medication  . 0.9 %  sodium chloride infusion    Activities of Daily Living In your present state of health, do you have any difficulty performing the following activities: 03/02/2016 08/06/2015  Hearing? N N    Vision? N N  Difficulty concentrating or making decisions? N N  Walking or climbing stairs? N N  Dressing or bathing? N N  Doing errands, shopping? N N  Preparing Food and eating ? N -  Using the Toilet? N -  Managing your Medications? N -  Managing your Finances? N -  Housekeeping or managing your Housekeeping? N -  Some recent data might be hidden    Patient Care Team: Colon Branch, MD as PCP - General Raynelle Bring, MD as Consulting Physician (Urology) Valaria Good, MD as Referring Physician (Cardiology) Noralyn Pick, MD as Consulting Physician (Ophthalmology)   Assessment:    Physical assessment deferred to PCP.  Exercise Activities and Dietary recommendations Current Exercise Habits: Home exercise routine, Type of exercise: walking, Frequency (Times/Week): 7, Intensity: Moderate  Diet (meal preparation, eat out, water intake, caffeinated beverages, dairy products, fruits and vegetables): in general, a "healthy" diet  , well balanced, on average, 3 meals per day. Likes to [employer] own meals. Does not eat red meat. Eats fish, pasta, veggies, beans/lentils. Drinks water, trying to increase water intake.  Goals    . Continue traveling     . Stay physically active      Fall Risk Fall Risk  03/02/2016 08/06/2015 04/24/2015 01/16/2014 08/30/2012  Falls in the past year? No No Yes No No  Number falls in past yr: - - 1 - -  Injury with Fall? - - Yes - -  Follow up - - Falls evaluation completed - -   Depression Screen PHQ 2/9 Scores 03/02/2016 08/06/2015 04/24/2015 01/16/2014  PHQ - 2 Score 0 0 0 0    Cognitive Function MMSE - Mini Mental State Exam 03/02/2016  Orientation to time 5  Orientation to Place 5  Registration 3  Attention/ Calculation 5  Recall 2  Language- name 2 objects 2  Language- repeat 1  Language- follow 3 step command 3  Language- read & follow direction 1  Write a sentence 1  Copy design 1  Total score 29        Immunization History  Administered  Date(s) Administered  . Influenza Whole 03/10/2007, 11/17/2007  . Pneumococcal Conjugate-13 01/16/2014  . Pneumococcal Polysaccharide-23 01/12/2005, 04/21/2010  . Td 06/15/2006, 02/23/2015  . Zoster 03/25/1998   Screening Tests Health Maintenance  Topic Date Due  . INFLUENZA VACCINE  05/22/2016 (Originally 09/23/2015)  . TETANUS/TDAP  06/14/2016  . COLONOSCOPY  10/18/2017  . ZOSTAVAX  Completed  . PNA vac Low Risk Adult  Completed      Plan:   Follow-up w/ PCP as scheduled.   Continue to eat heart healthy diet (full of fruits, vegetables, whole grains, lean protein, water--limit salt, fat, and sugar intake) and increase physical activity as tolerated.  Continue doing brain stimulating activities (puzzles, reading, adult coloring books, staying active) to keep memory sharp.   During the course of the visit the patient was educated and counseled about the following appropriate screening and preventive services:   Vaccines to include Pneumoccal, Influenza, Hepatitis B, Td, Zostavax, HCV  Cardiovascular Disease  Colorectal cancer screening  Diabetes screening  Prostate Cancer Screening  Glaucoma screening  Patient Instructions (the written plan) was given to the patient.   Dorrene German, RN  03/02/2016  Kathlene November, MD

## 2016-03-02 NOTE — Progress Notes (Signed)
Pre visit review using our clinic review tool, if applicable. No additional management support is needed unless otherwise documented below in the visit note. 

## 2016-03-02 NOTE — Progress Notes (Signed)
Subjective:    Patient ID: Kevin Bass, male    DOB: [birth year], 76 y.o.   MRN: [id number]  DOS:  03/02/2016 Type of visit - description : CPX Interval history: No major concerns. he self discontinue gabapentin as facial pain is resolved   Review of Systems Constitutional: No fever. No chills. No unexplained wt changes. No unusual sweats  HEENT: No dental problems, no ear discharge, no facial swelling, no voice changes. No eye discharge, no eye  redness , no  intolerance to light   Respiratory: No wheezing , no  difficulty breathing. No cough , no mucus production  Cardiovascular: No CP, no leg swelling , no  Palpitations  GI: no nausea, no vomiting, no diarrhea , no  abdominal pain.  No blood in the stools. No dysphagia, no odynophagia    Endocrine: No polyphagia, no polyuria , no polydipsia  GU: No dysuria, gross hematuria, difficulty urinating. No urinary urgency, no frequency.  Musculoskeletal: No joint swellings or unusual aches or pains  Skin: No change in the color of the skin, palor , no  Rash  Allergic, immunologic: No environmental allergies , no  food allergies  Neurological: No dizziness no  syncope. No headaches. No diplopia, no slurred, no slurred speech, no motor deficits, no facial  Numbness  Hematological: No enlarged lymph nodes, no easy bruising , no unusual bleedings  Psychiatry: No suicidal ideas, no hallucinations, no beavior problems, no confusion.  No unusual/severe anxiety, no depression   Past Medical History:  Diagnosis Date  . Allergy   . Anxiety   . Arthritis    hands  . Colon cancer [facility])    s/p partial colectomy 1990, Cscope neg 7-09, next 2014  . DISORDER, MITRAL VALVE    MV recontruction at [place] 2006----still needs ABX for SBE prophylaxis   . Elevated PSA    [name]  . GERD (gastroesophageal reflux disease)   . Heart murmur    no problems since surgery  . HYPERGLYCEMIA, BORDERLINE 10/16/2008  . HYPERLIPIDEMIA 06/15/2006     diet controlled  . HYPERTROPHY PROSTATE W/O UR OBST & OTH LUTS 10/16/2008  . Right groin pain    Dr. Redmond Pulling  . Wears partial dentures    lower partial    Past Surgical History:  Procedure Laterality Date  . COLECTOMY  1990  . COLONOSCOPY    . HERNIA REPAIR Left 2014   [facility] rep w/mesh- March 2014  . MITRAL VALVE ANNULOPLASTY     [facility] 2006    Social History   Social History  . Marital status: Married    Spouse name: suzanne  . Number of children: 2  . Years of education: college   Occupational History  . retired     Social History Main Topics  . Smoking status: Former Smoker    Packs/day: 0.50    Years: 20.00    Types: Cigarettes    Quit date: 02/23/1983  . Smokeless tobacco: Never Used  . Alcohol use 4.2 oz/week    7 Glasses of wine per week  . Drug use: No  . Sexual activity: Not on file   Other Topics Concern  . Not on file   Social History Narrative   From [place], household pt and wife   Daughter in [place], son in [place]           Family History  Problem Relation Age of Onset  . Hypertension Mother   . Hypertension Sister   . Hypertension Brother   .  Colon cancer Other     M side (cousins)  . Diabetes Neg Hx   . Stroke Neg Hx   . CAD Neg Hx   . Prostate cancer Neg Hx   . Rectal cancer Neg Hx   . Esophageal cancer Neg Hx      Allergies as of 03/02/2016      Reactions   Statins    Other reaction(s): Myalgias (intolerance)      Medication List       Accurate as of 03/02/16  4:58 PM. Always use your most recent med list.          ALPRAZolam 0.25 MG tablet Commonly known as:  XANAX Take 1-2 tablets (0.25-0.5 mg total) by mouth 2 (two) times daily as needed for anxiety.   ARGININE PO Take 3 each by mouth daily.   aspirin 81 MG tablet Take 81 mg by mouth daily.   loratadine 10 MG tablet Commonly known as:  CLARITIN Take 10 mg by mouth daily as needed for allergies.   metoprolol 50 MG tablet Commonly known as:  LOPRESSOR Take 0.5 tablets  (25 mg total) by mouth 2 (two) times daily.   NON FORMULARY Blood pressure control supplement- takes 3 daily   OCUVITE PRESERVISION PO Take 1 tablet by mouth daily.   omeprazole 40 MG capsule Commonly known as:  PRILOSEC TAKE 1 CAPSULE BY MOUTH DAILY.   PROSTATE HEALTH PO Take 1 tablet by mouth daily.   RED YEAST RICE PO Take 2 each by mouth daily.          Objective:   Physical Exam BP 118/76 (BP Location: Left Arm, Patient Position: Sitting, Cuff Size: Normal)   Pulse 99   Temp 97.8 F (36.6 C) (Oral)   Resp 14   Ht '5\' 10"'$  (1.778 m)   Wt 181 lb (82.1 kg)   SpO2 96%   BMI 25.97 kg/m   General:   Well developed, well nourished . NAD.  Neck: No  thyromegaly  HEENT:  Normocephalic . Face symmetric, atraumatic Lungs:  CTA B Normal respiratory effort, no intercostal retractions, no accessory muscle use. Heart: RRR,  no murmur.  No pretibial edema bilaterally  Abdomen:  Not distended, soft, non-tender. No rebound or rigidity.   Skin: Exposed areas without rash. Not pale. Not jaundice Neurologic:  alert & oriented X3.  Speech normal, gait appropriate for age and unassisted Strength symmetric and appropriate for age.  Psych: Cognition and judgment appear intact.  Cooperative with normal attention span and concentration.  Behavior appropriate. No anxious or depressed appearing.    Assessment & Plan:   Assessment Prediabetes HTN Hyperlipidemia, statin intolerant  Anxiety- rarely takes xanax Facial neuropathy dx 2015, no w/u, sx resurface x1 after temporary d/c of meds  Mitral valve annuloplasty 2006-- needs SBE prophylaxis Elevated PSA and BPH-- Dr. [name] Colon cancer partial colectomy 1990, colonoscopy 08-2007 negative, Cscope again 09-2012 normal, 5 years   PLAN: Prediabetes: Last A1c satisfactory, doing well with diet and exercise HTN: On beta blockers, BP is very good. Last BMP satisfactory Hyperlipidemia:  Based on last results, was prescribed Zetia,  apparently never tried. Explained patient Zetia is not a statin, encouraged him to let me know if he ever decides to try medication. Facial neuropathy: Now asx, off gabapentin as of 02-2016 To have a Medicare wellness today RTC 6-8 months

## 2016-03-02 NOTE — Patient Instructions (Addendum)
GO TO THE LAB : Get the blood work     GO TO THE FRONT DESK Schedule your next appointment for a  routine checkup in 6-8 months ROV   Kevin Bass , Thank you for taking time to come for your Medicare Wellness Visit. I appreciate your ongoing commitment to your health goals. Please review the following plan we discussed and let me know if I can assist you in the future.   These are the goals we discussed: Goals    . Continue traveling     . Stay physically active       This is a list of the screening recommended for you and due dates:  Health Maintenance  Topic Date Due  . Flu Shot  05/22/2016*  . Tetanus Vaccine  06/14/2016  . Colon Cancer Screening  10/18/2017  . Shingles Vaccine  Completed  . Pneumonia vaccines  Completed  *Topic was postponed. The date shown is not the original due date.

## 2016-03-30 DIAGNOSIS — M79673 Pain in unspecified foot: Secondary | ICD-10-CM | POA: Diagnosis not present

## 2016-04-07 ENCOUNTER — Ambulatory Visit: Payer: Self-pay | Admitting: Internal Medicine

## 2016-04-07 ENCOUNTER — Telehealth: Payer: Self-pay

## 2016-04-07 ENCOUNTER — Encounter: Payer: Self-pay | Admitting: Internal Medicine

## 2016-04-07 ENCOUNTER — Ambulatory Visit (INDEPENDENT_AMBULATORY_CARE_PROVIDER_SITE_OTHER): Payer: PPO | Admitting: Internal Medicine

## 2016-04-07 VITALS — BP 132/88 | HR 65 | Temp 98.0°F | Resp 14 | Ht 70.0 in | Wt 184.0 lb

## 2016-04-07 DIAGNOSIS — M79672 Pain in left foot: Secondary | ICD-10-CM | POA: Diagnosis not present

## 2016-04-07 DIAGNOSIS — E785 Hyperlipidemia, unspecified: Secondary | ICD-10-CM | POA: Diagnosis not present

## 2016-04-07 NOTE — Telephone Encounter (Signed)
Received medical records. Forwarded to PCP.

## 2016-04-07 NOTE — Progress Notes (Signed)
Pre visit review using our clinic review tool, if applicable. No additional management support is needed unless otherwise documented below in the visit note. 

## 2016-04-07 NOTE — Telephone Encounter (Signed)
ROI faxed to [facility] at [phone]. ROI sent for scanning. Awaiting records.

## 2016-04-07 NOTE — Progress Notes (Signed)
Subjective:    Patient ID: Kevin Bass, male    DOB: 1940-12-31, 76 y.o.   MRN: [id number]  DOS:  04/07/2016 Type of visit - description :acute  Interval history: Developed pain, L foot, on-off, at the lateral dorsum. No injury. No redness, warmness, swelling. Symptoms decreased a little with Aleve as needed Went to urgent care, x-ray showed "atherosclerosis". Was given a IM injection (?steroids) At this point he is better.   Review of Systems No chest pain or difficulty breathing No claudication  Past Medical History:  Diagnosis Date  . Allergy   . Anxiety   . Arthritis    hands  . Colon cancer [facility])    s/p partial colectomy 1990, Cscope neg 7-09, next 2014  . DISORDER, MITRAL VALVE    MV recontruction at [place] 2006----still needs ABX for SBE prophylaxis   . Elevated PSA    [name]  . GERD (gastroesophageal reflux disease)   . Heart murmur    no problems since surgery  . HYPERGLYCEMIA, BORDERLINE 10/16/2008  . HYPERLIPIDEMIA 06/15/2006   diet controlled  . HYPERTROPHY PROSTATE W/O UR OBST & OTH LUTS 10/16/2008  . Right groin pain    Dr. Redmond Pulling  . Wears partial dentures    lower partial    Past Surgical History:  Procedure Laterality Date  . COLECTOMY  1990  . COLONOSCOPY    . HERNIA REPAIR Left 2014   [facility] rep w/mesh- March 2014  . MITRAL VALVE ANNULOPLASTY     [facility] 2006    Social History   Social History  . Marital status: Married    Spouse name: suzanne  . Number of children: 2  . Years of education: college   Occupational History  . retired     Social History Main Topics  . Smoking status: Former Smoker    Packs/day: 0.50    Years: 20.00    Types: Cigarettes    Quit date: 02/23/1983  . Smokeless tobacco: Never Used  . Alcohol use 4.2 oz/week    7 Glasses of wine per week  . Drug use: No  . Sexual activity: Not on file   Other Topics Concern  . Not on file   Social History Narrative   From [place], household pt and wife   Daughter in  [place], son in [place] as of 04/07/2016      Reactions   Statins    Other reaction(s): Myalgias (intolerance)      Medication List       Accurate as of 04/07/16 11:59 PM. Always use your most recent med list.          ALPRAZolam 0.25 MG tablet Commonly known as:  XANAX Take 1-2 tablets (0.25-0.5 mg total) by mouth 2 (two) times daily as needed for anxiety.   ARGININE PO Take 3 each by mouth daily.   aspirin 81 MG tablet Take 81 mg by mouth daily.   loratadine 10 MG tablet Commonly known as:  CLARITIN Take 10 mg by mouth daily as needed for allergies.   metoprolol 50 MG tablet Commonly known as:  LOPRESSOR Take 0.5 tablets (25 mg total) by mouth 2 (two) times daily.   NON FORMULARY Blood pressure control supplement- takes 3 daily   OCUVITE PRESERVISION PO Take 1 tablet by mouth daily.   omeprazole 40 MG capsule Commonly known as:  PRILOSEC TAKE 1 CAPSULE BY MOUTH DAILY.   PROSTATE  HEALTH PO Take 1 tablet by mouth daily.   RED YEAST RICE PO Take 2 each by mouth daily.          Objective:   Physical Exam BP 132/88 (BP Location: Left Arm, Patient Position: Sitting, Cuff Size: Normal)   Pulse 65   Temp 98 F (36.7 C) (Oral)   Resp 14   Ht '5\' 10"'$  (1.778 m)   Wt 184 lb (83.5 kg)   SpO2 98%   BMI 26.40 kg/m  General:   Well developed, well nourished . NAD.  HEENT:  Normocephalic . Face symmetric, atraumatic Lower extremities: Right foot normal Left foot: Normal, symmetric, no redness, swelling. Ankle range of motion normal Pedal and femoral pulses bilateral normal Skin: Not pale. Not jaundice Neurologic:  alert & oriented X3.  Speech normal, gait appropriate for age and unassisted Psych--  Cognition and judgment appear intact.  Cooperative with normal attention span and concentration.  Behavior appropriate. No anxious or depressed appearing.      Assessment & Plan:   Assessment Prediabetes HTN Hyperlipidemia, statin  intolerant  Anxiety- rarely takes xanax Facial neuropathy dx 2015, no w/u, sx resurface x1 after temporary d/c of meds  Mitral valve annuloplasty 2006-- needs SBE prophylaxis Elevated PSA and BPH-- Dr. [name] Colon cancer partial colectomy 1990, colonoscopy 08-2007 negative, Cscope again 09-2012 normal, 5 years   PLAN: Left foot pain: Sx started a few days ago,at this point he's better, went to urgent care and got a steroid (?) shot, aleve helped. Doubt gout d/t lack of redness and swelling. MSK exam today is essentially negative.  X-ray at the urgent care showed "atherosclerosis"  Pt concern about the x-ray finding, they were probably referring to calcification of the arteries. Pedal pulses are good, no claudication. At this point I simply recommend good control of his CV RF.Will get the actual x-ray report. Addendum: Note and XR form the UC reviewed, XR=  + DJD, it did show periarticular calcification due to atherosclerosis. Advice is the same High cholesterol: Would like his cholesterol rechecked and will reconsider Zetia. See instructions.

## 2016-04-07 NOTE — Patient Instructions (Signed)
Schedule labs to be done tomorrow or the next day, fasting  Call if the foot pain returns

## 2016-04-08 ENCOUNTER — Other Ambulatory Visit (INDEPENDENT_AMBULATORY_CARE_PROVIDER_SITE_OTHER): Payer: PPO

## 2016-04-08 DIAGNOSIS — E785 Hyperlipidemia, unspecified: Secondary | ICD-10-CM

## 2016-04-08 LAB — LIPID PANEL
CHOL/HDL RATIO: 5
Cholesterol: 232 mg/dL — ABNORMAL HIGH (ref 0–200)
HDL: 49.9 mg/dL (ref >39.00)
LDL Cholesterol: 164 mg/dL — ABNORMAL HIGH (ref 0–99)
NonHDL: 182.48
Triglycerides: 92 mg/dL (ref 0.0–149.0)
VLDL: 18.4 mg/dL (ref 0.0–40.0)

## 2016-04-09 NOTE — Assessment & Plan Note (Signed)
Left foot pain: Sx started a few days ago,at this point he's better, went to urgent care and got a steroid (?) shot, aleve helped. Doubt gout d/t lack of redness and swelling. MSK exam today is essentially negative.  X-ray at the urgent care showed "atherosclerosis"  Pt concern about the x-ray finding, they were probably referring to calcification of the arteries. Pedal pulses are good, no claudication. At this point I simply recommend good control of his CV RF.Will get the actual x-ray report. Addendum: Note and XR form the UC reviewed, XR=  + DJD, it did show periarticular calcification due to atherosclerosis. Advice is the same High cholesterol: Would like his cholesterol rechecked and will reconsider Zetia. See instructions.

## 2016-04-09 NOTE — Telephone Encounter (Signed)
Reviewed, see OV note

## 2016-04-23 DIAGNOSIS — I10 Essential (primary) hypertension: Secondary | ICD-10-CM | POA: Diagnosis not present

## 2016-04-23 DIAGNOSIS — Z952 Presence of prosthetic heart valve: Secondary | ICD-10-CM | POA: Diagnosis not present

## 2016-04-23 DIAGNOSIS — R0602 Shortness of breath: Secondary | ICD-10-CM | POA: Diagnosis not present

## 2016-04-23 DIAGNOSIS — E785 Hyperlipidemia, unspecified: Secondary | ICD-10-CM | POA: Diagnosis not present

## 2016-04-26 DIAGNOSIS — I071 Rheumatic tricuspid insufficiency: Secondary | ICD-10-CM | POA: Diagnosis not present

## 2016-04-26 DIAGNOSIS — Z9889 Other specified postprocedural states: Secondary | ICD-10-CM | POA: Diagnosis not present

## 2016-04-26 DIAGNOSIS — I371 Nonrheumatic pulmonary valve insufficiency: Secondary | ICD-10-CM | POA: Diagnosis not present

## 2016-04-26 DIAGNOSIS — R0602 Shortness of breath: Secondary | ICD-10-CM | POA: Diagnosis not present

## 2016-04-30 DIAGNOSIS — Z7982 Long term (current) use of aspirin: Secondary | ICD-10-CM | POA: Diagnosis not present

## 2016-04-30 DIAGNOSIS — R079 Chest pain, unspecified: Secondary | ICD-10-CM | POA: Diagnosis not present

## 2016-04-30 DIAGNOSIS — R9431 Abnormal electrocardiogram [ECG] [EKG]: Secondary | ICD-10-CM | POA: Diagnosis not present

## 2016-04-30 DIAGNOSIS — E785 Hyperlipidemia, unspecified: Secondary | ICD-10-CM | POA: Diagnosis not present

## 2016-04-30 DIAGNOSIS — I10 Essential (primary) hypertension: Secondary | ICD-10-CM | POA: Diagnosis not present

## 2016-04-30 DIAGNOSIS — R0602 Shortness of breath: Secondary | ICD-10-CM | POA: Diagnosis not present

## 2016-04-30 DIAGNOSIS — Z79899 Other long term (current) drug therapy: Secondary | ICD-10-CM | POA: Diagnosis not present

## 2016-04-30 DIAGNOSIS — I2510 Atherosclerotic heart disease of native coronary artery without angina pectoris: Secondary | ICD-10-CM | POA: Diagnosis not present

## 2016-05-21 DIAGNOSIS — I10 Essential (primary) hypertension: Secondary | ICD-10-CM | POA: Diagnosis not present

## 2016-05-21 DIAGNOSIS — R0602 Shortness of breath: Secondary | ICD-10-CM | POA: Diagnosis not present

## 2016-05-21 DIAGNOSIS — Z952 Presence of prosthetic heart valve: Secondary | ICD-10-CM | POA: Diagnosis not present

## 2016-06-03 ENCOUNTER — Other Ambulatory Visit: Payer: Self-pay

## 2016-06-03 MED ORDER — METOPROLOL TARTRATE 50 MG PO TABS: 25.0000 mg | ORAL_TABLET | Freq: Two times a day (BID) | ORAL | 2 refills | Status: DC

## 2016-06-05 ENCOUNTER — Encounter: Payer: Self-pay | Admitting: Internal Medicine

## 2016-06-07 MED ORDER — METOPROLOL TARTRATE 25 MG PO TABS: 25.0000 mg | ORAL_TABLET | Freq: Two times a day (BID) | ORAL | 3 refills | Status: DC

## 2016-06-07 NOTE — Telephone Encounter (Signed)
Rx sent 

## 2016-06-07 NOTE — Telephone Encounter (Signed)
see message  Received: Today  Message Contents  Colon Branch, MD  Damita Dunnings, CMA        Send metoprolol 25 mg bid #180 3 RF

## 2016-06-16 ENCOUNTER — Encounter: Payer: Self-pay | Admitting: Internal Medicine

## 2016-06-17 MED ORDER — GABAPENTIN 300 MG PO CAPS: 300.0000 mg | ORAL_CAPSULE | Freq: Two times a day (BID) | ORAL | 6 refills | Status: DC

## 2016-06-17 NOTE — Telephone Encounter (Signed)
Rx sent 

## 2016-06-17 NOTE — Telephone Encounter (Signed)
See message  Received: Today  Message Contents  Colon Branch, MD  Damita Dunnings, CMA        send Gabapentin 300 mg one by mouth twice a day #60 and 6 refills or 180 and one refill

## 2016-07-06 ENCOUNTER — Other Ambulatory Visit: Payer: Self-pay | Admitting: Internal Medicine

## 2016-07-13 ENCOUNTER — Encounter: Payer: Self-pay | Admitting: Internal Medicine

## 2016-07-13 ENCOUNTER — Ambulatory Visit (INDEPENDENT_AMBULATORY_CARE_PROVIDER_SITE_OTHER): Payer: PPO | Admitting: Internal Medicine

## 2016-07-13 VITALS — BP 116/78 | HR 67 | Temp 98.1°F | Resp 14 | Ht 70.0 in | Wt 181.5 lb

## 2016-07-13 DIAGNOSIS — G518 Other disorders of facial nerve: Secondary | ICD-10-CM | POA: Diagnosis not present

## 2016-07-13 NOTE — Assessment & Plan Note (Signed)
Facial neuralgia: Patient has sx c/w neuralgia, sx could be d/t  a dental or sinus issue versus neuropathy, less likely an atypical presentation of T.A. Had a similar presentation 2015 at the time K99 and folic acid were normal. Plan: Continue gabapentin which is helping, see the dentist to rule out a dental problem, check a CBC, sedimentation rate and BMP. Follow up 2 to 3 weeks,  sooner if severe or increasing sxs. See AVS. RTC 2 or 3 weeks

## 2016-07-13 NOTE — Progress Notes (Signed)
Pre visit review using our clinic review tool, if applicable. No additional management support is needed unless otherwise documented below in the visit note. 

## 2016-07-13 NOTE — Progress Notes (Signed)
Subjective:    Patient ID: Kevin Bass, male    DOB: 09-06-1940, 76 y.o.   MRN: [id number]  DOS:  07/13/2016 Type of visit - description : Acute visit Interval history: Had similar symptoms in 2015. This time, symptoms resurface around 06/11/2016: when he touch his right frontal teeth by chewing or brushing his teeth he has a shooting pain up to the side of the nose and right forehead. Symptoms were on and off, responding well to gabapentin as needed ( which was called in 06/16/2016).  He is here because symptoms were more noticeable this morning and in the last few days he needed gabapentin consistently twice a day.   Review of Systems No fever chills. No rash No facial paralysis. No visual disturbances. No dental pain per se but 2 weeks ago had 2 fillings at the R molars . No headache, weight loss or myalgias.   Past Medical History:  Diagnosis Date  . Allergy   . Anxiety   . Arthritis    hands  . Colon cancer [facility])    s/p partial colectomy 1990, Cscope neg 7-09, next 2014  . DISORDER, MITRAL VALVE    MV recontruction at [place] 2006----still needs ABX for SBE prophylaxis   . Elevated PSA    [name]  . GERD (gastroesophageal reflux disease)   . Heart murmur    no problems since surgery  . HYPERGLYCEMIA, BORDERLINE 10/16/2008  . HYPERLIPIDEMIA 06/15/2006   diet controlled  . HYPERTROPHY PROSTATE W/O UR OBST & OTH LUTS 10/16/2008  . Right groin pain    Dr. Redmond Pulling  . Wears partial dentures    lower partial    Past Surgical History:  Procedure Laterality Date  . COLECTOMY  1990  . COLONOSCOPY    . HERNIA REPAIR Left 2014   [facility] rep w/mesh- March 2014  . MITRAL VALVE ANNULOPLASTY     [facility] 2006    Social History   Social History  . Marital status: Married    Spouse name: suzanne  . Number of children: 2  . Years of education: college   Occupational History  . retired     Social History Main Topics  . Smoking status: Former Smoker    Packs/day: 0.50    Years: 20.00    Types: Cigarettes    Quit date: 02/23/1983  . Smokeless tobacco: Never Used  . Alcohol use 4.2 oz/week    7 Glasses of wine per week  . Drug use: No  . Sexual activity: Not on file   Other Topics Concern  . Not on file   Social History Narrative   From [place], household pt and wife   Daughter in [place], son in [place] as of 07/13/2016      Reactions   Statins    Other reaction(s): Myalgias (intolerance)      Medication List       Accurate as of 07/13/16  6:49 PM. Always use your most recent med list.          ALPRAZolam 0.25 MG tablet Commonly known as:  XANAX Take 1-2 tablets (0.25-0.5 mg total) by mouth 2 (two) times daily as needed for anxiety.   ARGININE PO Take 3 each by mouth daily.   aspirin 81 MG tablet Take 81 mg by mouth daily.   ezetimibe 10 MG tablet Commonly known as:  ZETIA Take 10 mg by mouth daily.   FISH  OIL PO Take by mouth.   gabapentin 300 MG capsule Commonly known as:  NEURONTIN Take 1 capsule (300 mg total) by mouth 2 (two) times daily.   hydrochlorothiazide 25 MG tablet Commonly known as:  HYDRODIURIL Take 25 mg by mouth at bedtime.   loratadine 10 MG tablet Commonly known as:  CLARITIN Take 10 mg by mouth daily as needed for allergies.   metoprolol tartrate 25 MG tablet Commonly known as:  LOPRESSOR Take 1 tablet (25 mg total) by mouth 2 (two) times daily.   NON FORMULARY Blood pressure control supplement- takes 3 daily   OCUVITE PRESERVISION PO Take 1 tablet by mouth daily.   omeprazole 40 MG capsule Commonly known as:  PRILOSEC Take 1 capsule (40 mg total) by mouth daily.   PROSTATE HEALTH PO Take 1 tablet by mouth daily.   RED YEAST RICE PO Take 2 each by mouth daily.          Objective:   Physical Exam BP 116/78 (BP Location: Left Arm, Patient Position: Sitting, Cuff Size: Small)   Pulse 67   Temp 98.1 F (36.7 C) (Oral)   Resp 14   Ht '5\' 10"'$  (1.778 m)   Wt 181 lb 8  oz (82.3 kg)   SpO2 97%   BMI 26.04 kg/m  General:   Well developed, well nourished . NAD.  HEENT:  Normocephalic . Face symmetric, atraumatic. TMs normal, oral cavity normal to inspection , palpation without obvious  dental infections,, abscesses. Palpation and percussion of the teeth cause no pain at this time. EOMI. Temple palpated, good pulses, no tenderness. Neck: No lymphadenopathies or mass. Lungs:  CTA B Normal respiratory effort, no intercostal retractions, no accessory muscle use. Heart: RRR,  no murmur.  No pretibial edema bilaterally  Skin: Not pale. Not jaundice Neurologic:  alert & oriented X3.  Speech normal, gait appropriate for age and unassisted. Motor exam symmetric, DTRs symmetric Psych--  Cognition and judgment appear intact.  Cooperative with normal attention span and concentration.  Behavior appropriate. No anxious or depressed appearing.      Assessment & Plan:   Assessment Prediabetes HTN Hyperlipidemia, statin intolerant  Anxiety- rarely takes xanax Facial neuropathy dx 2015, no w/u, sx resurface x1 after temporary d/c of meds  CV: --Mitral valve annuloplasty 2006-- needs SBE prophylaxis -- SOB 04-2016, seen at [facility], had a ECHO Nl fx MV, cath: no CAD Elevated PSA and BPH-- Dr. [name] Colon cancer partial colectomy 1990, colonoscopy 08-2007 negative, Cscope again 09-2012 normal, 5 years   PLAN: Facial neuralgia: Patient has sx c/w neuralgia, sx could be d/t  a dental or sinus issue versus neuropathy, less likely an atypical presentation of T.A. Had a similar presentation 2015 at the time Z30 and folic acid were normal. Plan: Continue gabapentin which is helping, see the dentist to rule out a dental problem, check a CBC, sedimentation rate and BMP. Follow up 2 to 3 weeks,  sooner if severe or increasing sxs. See AVS. RTC 2 or 3 weeks

## 2016-07-13 NOTE — Patient Instructions (Signed)
GO TO THE LAB : Get the blood work     GO TO THE FRONT DESK Schedule your next appointment for a   checkup in 2-3 weeks  Continue gabapentin  See your dentist  If you have severe symptoms or problems such as a rash in your face, problems with your vision, face or scalp numbness: call us immediately

## 2016-07-14 LAB — CBC WITH DIFFERENTIAL/PLATELET
BASOS ABS: 0.1 10*3/uL (ref 0.0–0.1)
Basophils Relative: 0.8 % (ref 0.0–3.0)
Eosinophils Absolute: 0.2 10*3/uL (ref 0.0–0.7)
Eosinophils Relative: 3.1 % (ref 0.0–5.0)
HCT: 43.8 % (ref 39.0–52.0)
HEMOGLOBIN: 15 g/dL (ref 13.0–17.0)
LYMPHS ABS: 2.5 10*3/uL (ref 0.7–4.0)
Lymphocytes Relative: 40.1 % (ref 12.0–46.0)
MCHC: 34.2 g/dL (ref 30.0–36.0)
MCV: 84 fl (ref 78.0–100.0)
MONO ABS: 0.3 10*3/uL (ref 0.1–1.0)
Monocytes Relative: 5.6 % (ref 3.0–12.0)
NEUTROS PCT: 50.4 % (ref 43.0–77.0)
Neutro Abs: 3.1 10*3/uL (ref 1.4–7.7)
Platelets: 221 10*3/uL (ref 150.0–400.0)
RBC: 5.22 Mil/uL (ref 4.22–5.81)
RDW: 13.2 % (ref 11.5–15.5)
WBC: 6.1 10*3/uL (ref 4.0–10.5)

## 2016-07-14 LAB — BASIC METABOLIC PANEL
BUN: 21 mg/dL (ref 6–23)
CO2: 29 mEq/L (ref 19–32)
Calcium: 9.6 mg/dL (ref 8.4–10.5)
Chloride: 102 mEq/L (ref 96–112)
Creatinine, Ser: 1.03 mg/dL (ref 0.40–1.50)
GFR: 74.25 mL/min (ref >60.00)
GLUCOSE: 79 mg/dL (ref 70–99)
POTASSIUM: 4 meq/L (ref 3.5–5.1)
Sodium: 139 mEq/L (ref 135–145)

## 2016-07-14 LAB — SEDIMENTATION RATE: SED RATE: 3 mm/h (ref 0–20)

## 2016-08-03 ENCOUNTER — Ambulatory Visit (INDEPENDENT_AMBULATORY_CARE_PROVIDER_SITE_OTHER): Payer: PPO | Admitting: Internal Medicine

## 2016-08-03 ENCOUNTER — Encounter: Payer: Self-pay | Admitting: Internal Medicine

## 2016-08-03 VITALS — BP 112/74 | HR 60 | Temp 97.7°F | Resp 14 | Ht 70.0 in | Wt 181.0 lb

## 2016-08-03 DIAGNOSIS — E785 Hyperlipidemia, unspecified: Secondary | ICD-10-CM

## 2016-08-03 DIAGNOSIS — G518 Other disorders of facial nerve: Secondary | ICD-10-CM

## 2016-08-03 LAB — LIPID PANEL
CHOL/HDL RATIO: 5
Cholesterol: 190 mg/dL (ref 0–200)
HDL: 36.3 mg/dL — AB (ref >39.00)
LDL Cholesterol: 124 mg/dL — ABNORMAL HIGH (ref 0–99)
NonHDL: 153.62
Triglycerides: 147 mg/dL (ref 0.0–149.0)
VLDL: 29.4 mg/dL (ref 0.0–40.0)

## 2016-08-03 NOTE — Assessment & Plan Note (Signed)
Facial neuralgia: Responding well to gabapentin, minimal residual sxs, patient satisfied with treatment and thus not like to increase gabapentin. We agreed to stay on it for a few months, then gradually decrease it and stop. High cholesterol: On zetia for about 4 months, would like to check a FLP. Will do. RTC August as scheduled

## 2016-08-03 NOTE — Progress Notes (Signed)
Subjective:    Patient ID: Kevin Bass, male    DOB: [birth year], 76 y.o.   MRN: [id number]  DOS:  08/03/2016 Type of visit - description : f/u from previous visit Interval history: Since the last visit, was started on gabapentin for facial pain. Symptoms are gradually improving, for the last few days they are definitely  better, from time to time he triggers the pain by washing his right forehead. He did go to the dentist, had a good checkup and no dental issue was found.  Review of Systems  No headaches, rash, lost of vision or diplopia  Past Medical History:  Diagnosis Date  . Allergy   . Anxiety   . Arthritis    hands  . Colon cancer [facility])    s/p partial colectomy 1990, Cscope neg 7-09, next 2014  . DISORDER, MITRAL VALVE    MV recontruction at [place] 2006----still needs ABX for SBE prophylaxis   . Elevated PSA    [name]  . GERD (gastroesophageal reflux disease)   . Heart murmur    no problems since surgery  . HYPERGLYCEMIA, BORDERLINE 10/16/2008  . HYPERLIPIDEMIA 06/15/2006   diet controlled  . HYPERTROPHY PROSTATE W/O UR OBST & OTH LUTS 10/16/2008  . Right groin pain    Dr. Redmond Pulling  . Wears partial dentures    lower partial    Past Surgical History:  Procedure Laterality Date  . COLECTOMY  1990  . COLONOSCOPY    . HERNIA REPAIR Left 2014   [facility] rep w/mesh- March 2014  . MITRAL VALVE ANNULOPLASTY     [facility] 2006    Social History   Social History  . Marital status: Married    Spouse name: suzanne  . Number of children: 2  . Years of education: college   Occupational History  . retired     Social History Main Topics  . Smoking status: Former Smoker    Packs/day: 0.50    Years: 20.00    Types: Cigarettes    Quit date: 02/23/1983  . Smokeless tobacco: Never Used  . Alcohol use 4.2 oz/week    7 Glasses of wine per week  . Drug use: No  . Sexual activity: Not on file   Other Topics Concern  . Not on file   Social History Narrative   From  [place], household pt and wife   Daughter in [place], son in [place] as of 08/03/2016      Reactions   Statins    Other reaction(s): Myalgias (intolerance)      Medication List       Accurate as of 08/03/16 10:41 AM. Always use your most recent med list.          ALPRAZolam 0.25 MG tablet Commonly known as:  XANAX Take 1-2 tablets (0.25-0.5 mg total) by mouth 2 (two) times daily as needed for anxiety.   ARGININE PO Take 3 each by mouth daily.   aspirin 81 MG tablet Take 81 mg by mouth daily.   ezetimibe 10 MG tablet Commonly known as:  ZETIA Take 10 mg by mouth daily.   FISH OIL PO Take by mouth.   gabapentin 300 MG capsule Commonly known as:  NEURONTIN Take 1 capsule (300 mg total) by mouth 2 (two) times daily.   hydrochlorothiazide 25 MG tablet Commonly known as:  HYDRODIURIL Take 25 mg by mouth at bedtime.   loratadine 10 MG  tablet Commonly known as:  CLARITIN Take 10 mg by mouth daily as needed for allergies.   metoprolol tartrate 25 MG tablet Commonly known as:  LOPRESSOR Take 1 tablet (25 mg total) by mouth 2 (two) times daily.   NON FORMULARY Blood pressure control supplement- takes 3 daily   OCUVITE PRESERVISION PO Take 1 tablet by mouth daily.   omeprazole 40 MG capsule Commonly known as:  PRILOSEC Take 1 capsule (40 mg total) by mouth daily.   PROSTATE HEALTH PO Take 1 tablet by mouth daily.   RED YEAST RICE PO Take 2 each by mouth daily.          Objective:   Physical Exam BP 112/74 (BP Location: Left Arm, Patient Position: Sitting, Cuff Size: Small)   Pulse 60   Temp 97.7 F (36.5 C) (Oral)   Resp 14   Ht 5\' 10"  (1.778 m)   Wt 181 lb (82.1 kg)   SpO2 95%   BMI 25.97 kg/m  General:   Well developed, well nourished . NAD Neck: No mass. Normal range of motion.  HEENT:  Normocephalic . Face symmetric, atraumatic Oral cavity palpated and inspected: Normal Skin: Not pale. Not jaundice Neurologic:  alert &  oriented X3.  Speech normal, gait appropriate for age and unassisted. EOMI, pupils equal and reactive, face symmetric Psych--  Cognition and judgment appear intact.  Cooperative with normal attention span and concentration.  Behavior appropriate. No anxious or depressed appearing.      Assessment & Plan:  Assessment Prediabetes HTN Hyperlipidemia, statin intolerant  Anxiety- rarely takes xanax Facial neuropathy dx 2015, no w/u, sx resurface x1 after temporary d/c of meds  CV: --Mitral valve annuloplasty 2006-- needs SBE prophylaxis -- SOB 04-2016, seen at [facility], had a ECHO Nl fx MV, cath: no CAD Elevated PSA and BPH-- Dr. [name] Colon cancer partial colectomy 1990, colonoscopy 08-2007 negative, Cscope again 09-2012 normal, 5 years   PLAN: Facial neuralgia: Responding well to gabapentin, minimal residual sxs, patient satisfied with treatment and thus not like to increase gabapentin. We agreed to stay on it for a few months, then gradually decrease it and stop. High cholesterol: On zetia for about 4 months, would like to check a FLP. Will do. RTC August as scheduled

## 2016-08-03 NOTE — Patient Instructions (Signed)
Go to the lab.

## 2016-08-03 NOTE — Progress Notes (Signed)
Pre visit review using our clinic review tool, if applicable. No additional management support is needed unless otherwise documented below in the visit note. 

## 2016-08-05 ENCOUNTER — Encounter: Payer: Self-pay | Admitting: Internal Medicine

## 2016-08-26 ENCOUNTER — Ambulatory Visit (INDEPENDENT_AMBULATORY_CARE_PROVIDER_SITE_OTHER): Payer: PPO | Admitting: Internal Medicine

## 2016-08-26 ENCOUNTER — Encounter: Payer: Self-pay | Admitting: Internal Medicine

## 2016-08-26 VITALS — BP 142/70 | HR 70 | Temp 98.0°F | Resp 14 | Ht 70.0 in | Wt 179.2 lb

## 2016-08-26 DIAGNOSIS — M542 Cervicalgia: Secondary | ICD-10-CM

## 2016-08-26 NOTE — Patient Instructions (Signed)
Apply ICE twice a day  IBUPROFEN (Advil or Motrin) 200 mg 2 tablets every 6 hours as needed for pain.  Always take it with food because may cause gastritis and ulcers.  If you notice nausea, stomach pain, change in the color of stools --->  Stop the medicine and let us know   Call anytime if pain is severe or if you have a global headache, nausea, vomiting. Call if not improving in the next 3 or 4 days.

## 2016-08-26 NOTE — Progress Notes (Signed)
Pre visit review using our clinic review tool, if applicable. No additional management support is needed unless otherwise documented below in the visit note. 

## 2016-08-26 NOTE — Progress Notes (Signed)
Subjective:    Patient ID: Kevin Bass, male    DOB: 1940-03-03, 76 y.o.   MRN: [id number]  DOS:  08/26/2016 Type of visit - description : acute Interval history: 2 days ago developed mild, on and off pain located at the right side of the neck. See graphic. At some point the pain increase with certain movements but not now. Denies any head injury. Admits to some stress related to family issues over the last couple of days. Ambulatory BPs has been slightly elevated over the last 2 days but no more than 140, 150. pain does not radiate.  Review of Systems  Denies fever chills No nausea or vomiting No actual global headache. No visual disturbances, dizziness, motor deficits, facial numbness or slurred speech. Does have neuralgia and symptoms are controlled with gabapentin  Past Medical History:  Diagnosis Date  . Allergy   . Anxiety   . Arthritis    hands  . Colon cancer [facility])    s/p partial colectomy 1990, Cscope neg 7-09, next 2014  . DISORDER, MITRAL VALVE    MV recontruction at [place] 2006----still needs ABX for SBE prophylaxis   . Elevated PSA    [name]  . GERD (gastroesophageal reflux disease)   . Heart murmur    no problems since surgery  . HYPERGLYCEMIA, BORDERLINE 10/16/2008  . HYPERLIPIDEMIA 06/15/2006   diet controlled  . HYPERTROPHY PROSTATE W/O UR OBST & OTH LUTS 10/16/2008  . Right groin pain    Dr. Redmond Pulling  . Wears partial dentures    lower partial    Past Surgical History:  Procedure Laterality Date  . COLECTOMY  1990  . COLONOSCOPY    . HERNIA REPAIR Left 2014   [facility] rep w/mesh- March 2014  . MITRAL VALVE ANNULOPLASTY     [facility] 2006    Social History   Social History  . Marital status: Married    Spouse name: suzanne  . Number of children: 2  . Years of education: college   Occupational History  . retired     Social History Main Topics  . Smoking status: Former Smoker    Packs/day: 0.50    Years: 20.00    Types: Cigarettes    Quit  date: 02/23/1983  . Smokeless tobacco: Never Used  . Alcohol use 4.2 oz/week    7 Glasses of wine per week  . Drug use: No  . Sexual activity: Not on file   Other Topics Concern  . Not on file   Social History Narrative   From [place], household pt and wife   Daughter in [place], son in [place] as of 08/26/2016      Reactions   Statins    Other reaction(s): Myalgias (intolerance)      Medication List       Accurate as of 08/26/16 11:59 PM. Always use your most recent med list.          ALPRAZolam 0.25 MG tablet Commonly known as:  XANAX Take 1-2 tablets (0.25-0.5 mg total) by mouth 2 (two) times daily as needed for anxiety.   ARGININE PO Take 3 each by mouth daily.   aspirin 81 MG tablet Take 81 mg by mouth daily.   ezetimibe 10 MG tablet Commonly known as:  ZETIA Take 10 mg by mouth daily.   FISH OIL PO Take by mouth.   gabapentin 300 MG capsule Commonly known as:  NEURONTIN Take 1 capsule (300 mg total) by mouth 2 (two) times daily.   hydrochlorothiazide 25 MG tablet Commonly known as:  HYDRODIURIL Take 25 mg by mouth at bedtime.   loratadine 10 MG tablet Commonly known as:  CLARITIN Take 10 mg by mouth daily as needed for allergies.   metoprolol tartrate 25 MG tablet Commonly known as:  LOPRESSOR Take 1 tablet (25 mg total) by mouth 2 (two) times daily.   NON FORMULARY Blood pressure control supplement- takes 3 daily   OCUVITE PRESERVISION PO Take 1 tablet by mouth daily.   omeprazole 40 MG capsule Commonly known as:  PRILOSEC Take 1 capsule (40 mg total) by mouth daily.   PROSTATE HEALTH PO Take 1 tablet by mouth daily.   RED YEAST RICE PO Take 2 each by mouth daily.          Objective:   Physical Exam  Neck:     BP (!) 142/70 (BP Location: Left Arm, Patient Position: Sitting, Cuff Size: Small)   Pulse 70   Temp 98 F (36.7 C) (Oral)   Resp 14   Ht 5\' 10"  (1.778 m)   Wt 179 lb 4 oz (81.3 kg)   SpO2 97%   BMI  25.72 kg/m  General:   Well developed, well nourished . NAD.  HEENT:  Normocephalic . Face symmetric, atraumatic. Neck: No TTP at the cervical spine, not tender at the nuchal area, range of motion normal. Lungs:  CTA B Normal respiratory effort, no intercostal retractions, no accessory muscle use. Heart: RRR,  no murmur.  No pretibial edema bilaterally  Skin: Not pale. Not jaundice Neurologic:  alert & oriented X3.  Speech normal, gait appropriate for age and unassisted. Face symmetric, motor symmetric, DTRs symmetric. EOMI. Pupils equal and reactive Psych--  Cognition and judgment appear intact.  Cooperative with normal attention span and concentration.  Behavior appropriate. No anxious or depressed appearing.      Assessment & Plan:    Assessment Prediabetes HTN Hyperlipidemia, statin intolerant  Anxiety- rarely takes xanax Facial neuropathy dx 2015, no w/u, sx resurface x1 after temporary d/c of meds  CV: --Mitral valve annuloplasty 2006-- needs SBE prophylaxis -- SOB 04-2016, seen at [facility], had a ECHO Nl fx MV, cath: no CAD Elevated PSA and BPH-- Dr. [name] Colon cancer partial colectomy 1990, colonoscopy 08-2007 negative, Cscope again 09-2012 normal, 5 years   PLAN: Neck pain: Patient presents with a "headache", pain is actually more consistent with a cervicalgia. Neurological exam is normal, no worrisome sx, sx are  mild. We'll treat as a MSK problem with ice and NSAIDs, recommend to take NSAIDs sporadically and only for few days. He will call if he has any severe/persistent or alarming symptoms. Verbalized understanding.

## 2016-08-27 ENCOUNTER — Ambulatory Visit: Payer: PPO | Admitting: Internal Medicine

## 2016-08-27 NOTE — Assessment & Plan Note (Signed)
Neck pain: Patient presents with a "headache", pain is actually more consistent with a cervicalgia. Neurological exam is normal, no worrisome sx, sx are  mild. We'll treat as a MSK problem with ice and NSAIDs, recommend to take NSAIDs sporadically and only for few days. He will call if he has any severe/persistent or alarming symptoms. Verbalized understanding.

## 2016-09-06 ENCOUNTER — Other Ambulatory Visit: Payer: Self-pay | Admitting: Internal Medicine

## 2016-09-12 ENCOUNTER — Inpatient Hospital Stay (HOSPITAL_COMMUNITY): Payer: PPO

## 2016-09-12 ENCOUNTER — Encounter (HOSPITAL_COMMUNITY): Payer: Self-pay

## 2016-09-12 ENCOUNTER — Emergency Department (HOSPITAL_COMMUNITY): Payer: PPO

## 2016-09-12 ENCOUNTER — Inpatient Hospital Stay (HOSPITAL_COMMUNITY)
Admission: EM | Admit: 2016-09-12 | Discharge: 2016-09-15 | DRG: 066 | Disposition: A | Discharge status: Home / Self Care | Payer: PPO | Attending: Internal Medicine | Admitting: Internal Medicine

## 2016-09-12 DIAGNOSIS — I639 Cerebral infarction, unspecified: Secondary | ICD-10-CM | POA: Diagnosis not present

## 2016-09-12 DIAGNOSIS — E7800 Pure hypercholesterolemia, unspecified: Secondary | ICD-10-CM | POA: Diagnosis present

## 2016-09-12 DIAGNOSIS — I10 Essential (primary) hypertension: Secondary | ICD-10-CM | POA: Diagnosis present

## 2016-09-12 DIAGNOSIS — J309 Allergic rhinitis, unspecified: Secondary | ICD-10-CM | POA: Diagnosis not present

## 2016-09-12 DIAGNOSIS — R079 Chest pain, unspecified: Secondary | ICD-10-CM

## 2016-09-12 DIAGNOSIS — Z888 Allergy status to other drugs, medicaments and biological substances status: Secondary | ICD-10-CM | POA: Diagnosis not present

## 2016-09-12 DIAGNOSIS — N400 Enlarged prostate without lower urinary tract symptoms: Secondary | ICD-10-CM | POA: Diagnosis present

## 2016-09-12 DIAGNOSIS — E785 Hyperlipidemia, unspecified: Secondary | ICD-10-CM | POA: Diagnosis present

## 2016-09-12 DIAGNOSIS — R29701 NIHSS score 1: Secondary | ICD-10-CM | POA: Diagnosis not present

## 2016-09-12 DIAGNOSIS — F419 Anxiety disorder, unspecified: Secondary | ICD-10-CM | POA: Diagnosis not present

## 2016-09-12 DIAGNOSIS — F329 Major depressive disorder, single episode, unspecified: Secondary | ICD-10-CM | POA: Diagnosis present

## 2016-09-12 DIAGNOSIS — Z972 Presence of dental prosthetic device (complete) (partial): Secondary | ICD-10-CM | POA: Diagnosis not present

## 2016-09-12 DIAGNOSIS — Z85038 Personal history of other malignant neoplasm of large intestine: Secondary | ICD-10-CM

## 2016-09-12 DIAGNOSIS — I6340 Cerebral infarction due to embolism of unspecified cerebral artery: Secondary | ICD-10-CM | POA: Diagnosis not present

## 2016-09-12 DIAGNOSIS — Z800 Family history of malignant neoplasm of digestive organs: Secondary | ICD-10-CM | POA: Diagnosis not present

## 2016-09-12 DIAGNOSIS — R4701 Aphasia: Secondary | ICD-10-CM | POA: Diagnosis not present

## 2016-09-12 DIAGNOSIS — R05 Cough: Secondary | ICD-10-CM | POA: Diagnosis not present

## 2016-09-12 DIAGNOSIS — R471 Dysarthria and anarthria: Secondary | ICD-10-CM | POA: Diagnosis present

## 2016-09-12 DIAGNOSIS — K219 Gastro-esophageal reflux disease without esophagitis: Secondary | ICD-10-CM | POA: Diagnosis not present

## 2016-09-12 DIAGNOSIS — Z87891 Personal history of nicotine dependence: Secondary | ICD-10-CM | POA: Diagnosis not present

## 2016-09-12 DIAGNOSIS — R739 Hyperglycemia, unspecified: Secondary | ICD-10-CM | POA: Diagnosis present

## 2016-09-12 DIAGNOSIS — Z7982 Long term (current) use of aspirin: Secondary | ICD-10-CM | POA: Diagnosis not present

## 2016-09-12 DIAGNOSIS — Z538 Procedure and treatment not carried out for other reasons: Secondary | ICD-10-CM | POA: Diagnosis not present

## 2016-09-12 DIAGNOSIS — Z8249 Family history of ischemic heart disease and other diseases of the circulatory system: Secondary | ICD-10-CM | POA: Diagnosis not present

## 2016-09-12 DIAGNOSIS — Z79899 Other long term (current) drug therapy: Secondary | ICD-10-CM

## 2016-09-12 DIAGNOSIS — Z9049 Acquired absence of other specified parts of digestive tract: Secondary | ICD-10-CM

## 2016-09-12 HISTORY — DX: Essential (primary) hypertension: I10

## 2016-09-12 LAB — COMPREHENSIVE METABOLIC PANEL
ALBUMIN: 4.1 g/dL (ref 3.5–5.0)
ALK PHOS: 41 U/L (ref 38–126)
ALT: 15 U/L — AB (ref 17–63)
ANION GAP: 8 (ref 5–15)
AST: 21 U/L (ref 15–41)
BILIRUBIN TOTAL: 1.1 mg/dL (ref 0.3–1.2)
BUN: 15 mg/dL (ref 6–20)
CALCIUM: 9.4 mg/dL (ref 8.9–10.3)
CO2: 30 mmol/L (ref 22–32)
Chloride: 100 mmol/L — ABNORMAL LOW (ref 101–111)
Creatinine, Ser: 1.04 mg/dL (ref 0.61–1.24)
GFR calc Af Amer: >60 mL/min (ref >60)
GFR calc non Af Amer: >60 mL/min (ref >60)
GLUCOSE: 106 mg/dL — AB (ref 65–99)
Potassium: 3.8 mmol/L (ref 3.5–5.1)
SODIUM: 138 mmol/L (ref 135–145)
TOTAL PROTEIN: 7 g/dL (ref 6.5–8.1)

## 2016-09-12 LAB — LIPID PANEL
CHOL/HDL RATIO: 4.4 ratio
CHOLESTEROL: 185 mg/dL (ref 0–200)
HDL: 42 mg/dL (ref >40)
LDL Cholesterol: 124 mg/dL — ABNORMAL HIGH (ref 0–99)
TRIGLYCERIDES: 94 mg/dL (ref <150)
VLDL: 19 mg/dL (ref 0–40)

## 2016-09-12 LAB — CBC
HCT: 44.2 % (ref 39.0–52.0)
HEMOGLOBIN: 15 g/dL (ref 13.0–17.0)
MCH: 28.6 pg (ref 26.0–34.0)
MCHC: 33.9 g/dL (ref 30.0–36.0)
MCV: 84.4 fL (ref 78.0–100.0)
PLATELETS: 204 10*3/uL (ref 150–400)
RBC: 5.24 MIL/uL (ref 4.22–5.81)
RDW: 12.7 % (ref 11.5–15.5)
WBC: 6.2 10*3/uL (ref 4.0–10.5)

## 2016-09-12 LAB — PROTIME-INR
INR: 1.08
PROTHROMBIN TIME: 14 s (ref 11.4–15.2)

## 2016-09-12 LAB — DIFFERENTIAL
BASOS ABS: 0 10*3/uL (ref 0.0–0.1)
Basophils Relative: 0 %
Eosinophils Absolute: 0.1 10*3/uL (ref 0.0–0.7)
Eosinophils Relative: 1 %
LYMPHS ABS: 1.5 10*3/uL (ref 0.7–4.0)
LYMPHS PCT: 24 %
Monocytes Absolute: 0.3 10*3/uL (ref 0.1–1.0)
Monocytes Relative: 5 %
NEUTROS ABS: 4.3 10*3/uL (ref 1.7–7.7)
NEUTROS PCT: 70 %

## 2016-09-12 LAB — APTT: aPTT: 27 seconds (ref 24–36)

## 2016-09-12 LAB — I-STAT TROPONIN, ED: Troponin i, poc: 0 ng/mL (ref 0.00–0.08)

## 2016-09-12 MED ORDER — EZETIMIBE 10 MG PO TABS
10.0000 mg | ORAL_TABLET | Freq: Every day | ORAL | Status: DC
  Administered 2016-09-12 – 2016-09-14 (×3): 10 mg via ORAL
  Filled 2016-09-12 (×3): qty 1

## 2016-09-12 MED ORDER — PANTOPRAZOLE SODIUM 40 MG PO TBEC
40.0000 mg | DELAYED_RELEASE_TABLET | Freq: Every day | ORAL | Status: DC
  Administered 2016-09-12 – 2016-09-15 (×4): 40 mg via ORAL
  Filled 2016-09-12 (×4): qty 1

## 2016-09-12 MED ORDER — ALPRAZOLAM 0.25 MG PO TABS: 0.2500 mg | ORAL_TABLET | Freq: Two times a day (BID) | ORAL | Status: DC | PRN

## 2016-09-12 MED ORDER — ACETAMINOPHEN 650 MG RE SUPP: 650.0000 mg | RECTAL | Status: DC | PRN

## 2016-09-12 MED ORDER — ACETAMINOPHEN 325 MG PO TABS
650.0000 mg | ORAL_TABLET | ORAL | Status: DC | PRN
  Administered 2016-09-13 – 2016-09-14 (×2): 650 mg via ORAL
  Filled 2016-09-12 (×2): qty 2

## 2016-09-12 MED ORDER — STROKE: EARLY STAGES OF RECOVERY BOOK
Freq: Once | Status: AC
  Administered 2016-09-12: 14:00:00
  Filled 2016-09-12: qty 1

## 2016-09-12 MED ORDER — LORAZEPAM 1 MG PO TABS
1.0000 mg | ORAL_TABLET | Freq: Once | ORAL | Status: AC
  Administered 2016-09-12: 1 mg via ORAL
  Filled 2016-09-12: qty 1

## 2016-09-12 MED ORDER — ACETAMINOPHEN 160 MG/5ML PO SOLN: 650.0000 mg | ORAL | Status: DC | PRN

## 2016-09-12 MED ORDER — GABAPENTIN 300 MG PO CAPS
300.0000 mg | ORAL_CAPSULE | Freq: Two times a day (BID) | ORAL | Status: DC
  Administered 2016-09-12 – 2016-09-15 (×6): 300 mg via ORAL
  Filled 2016-09-12 (×7): qty 1

## 2016-09-12 MED ORDER — ASPIRIN 300 MG RE SUPP: 300.0000 mg | Freq: Every day | RECTAL | Status: DC

## 2016-09-12 MED ORDER — SENNOSIDES-DOCUSATE SODIUM 8.6-50 MG PO TABS: 1.0000 | ORAL_TABLET | Freq: Every evening | ORAL | Status: DC | PRN

## 2016-09-12 MED ORDER — SODIUM CHLORIDE 0.9 % IV SOLN
INTRAVENOUS | Status: AC
  Administered 2016-09-12: 16:00:00 via INTRAVENOUS

## 2016-09-12 MED ORDER — ASPIRIN 325 MG PO TABS
325.0000 mg | ORAL_TABLET | Freq: Every day | ORAL | Status: DC
  Administered 2016-09-12: 325 mg via ORAL
  Filled 2016-09-12 (×2): qty 1

## 2016-09-12 MED ORDER — ENOXAPARIN SODIUM 40 MG/0.4ML ~~LOC~~ SOLN
40.0000 mg | SUBCUTANEOUS | Status: DC
  Administered 2016-09-12 – 2016-09-13 (×2): 40 mg via SUBCUTANEOUS
  Filled 2016-09-12 (×2): qty 0.4

## 2016-09-12 NOTE — Evaluation (Signed)
Physical Therapy Evaluation Patient Details Name: Kevin Bass MRN: [id number] DOB: Feb 13, 1941 Today's Date: 09/12/2016   History of Present Illness  Kevin Bass is a 76 y.o. male with a history of colon cancer, hyperlipidemia, previous mitral valve repair (not replacement) who presents with aphasia.  MRI was performed which demonstrated a left cortical infarct.  Clinical Impression  Patient presents with problems listed below.  Will benefit from acute PT to maximize functional independence and safety prior to return home with family.  Patient with slight decrease in RLE strength and coordination impacting high level balance function during gait - fall risk.  Patient scored 20/24 on DGI balance assessment (score of 19 reflects fall risk).  Recommend patient have f/u OP PT for balance training.   Patient also appeared to have difficulty following directions, sequencing, requiring visual cueing.    Follow Up Recommendations Outpatient PT;Supervision for mobility/OOB (OP PT for balance training)    Equipment Recommendations  None recommended by PT    Recommendations for Other Services       Precautions / Restrictions Precautions Precautions: Fall Restrictions Weight Bearing Restrictions: No      Mobility  Bed Mobility Overal bed mobility: Modified Independent             General bed mobility comments: Increased time  Transfers Overall transfer level: Needs assistance Equipment used: None Transfers: Sit to/from Stand Sit to Stand: Supervision         General transfer comment: No physical assist needed.  Assist for balance/safety.  Ambulation/Gait Ambulation/Gait assistance: Supervision;Min assist Ambulation Distance (Feet): 200 Feet Assistive device: None Gait Pattern/deviations: Step-through pattern;Decreased stride length Gait velocity: decreased Gait velocity interpretation: Below normal speed for age/gender General Gait Details: Patient requires  only supervision during gait on level surfaces with head forward and no distractions.  Requires min assist to maintain balance with high level balance activities - head turns, looking down, etc.  Stairs Stairs: Yes Stairs assistance: Min guard Stair Management: No rails;Alternating pattern;Forwards Number of Stairs: 4 General stair comments: Patient able to negotiate stairs with min guard assist.  Wheelchair Mobility    Modified Rankin (Stroke Patients Only) Modified Rankin (Stroke Patients Only) Pre-Morbid Rankin Score: No symptoms Modified Rankin: Moderately severe disability     Balance Overall balance assessment: Needs assistance Sitting-balance support: No upper extremity supported;Feet supported Sitting balance-Leahy Scale: Good     Standing balance support: No upper extremity supported Standing balance-Leahy Scale: Good Standing balance comment: Decreased balance with high level balance activities Single Leg Stance - Right Leg: 12 Single Leg Stance - Left Leg: 17     Rhomberg - Eyes Opened: 30 Rhomberg - Eyes Closed: 19 (loses balance toward Rt side consistently)     Standardized Balance Assessment Standardized Balance Assessment : Dynamic Gait Index   Dynamic Gait Index Level Surface: Normal Change in Gait Speed: Normal Gait with Horizontal Head Turns: Mild Impairment Gait with Vertical Head Turns: Mild Impairment (With looking downward) Gait and Pivot Turn: Normal Step Over Obstacle: Mild Impairment (Required visual cueing to complete) Step Around Obstacles: Mild Impairment (Required visual cueing to complete) Steps: Normal Total Score: 20       Pertinent Vitals/Pain Pain Assessment: No/denies pain    Home Living Family/patient expects to be discharged to:: Private residence Living Arrangements: Spouse/significant other Available Help at Discharge: Family;Available 24 hours/day (Wife 24 hrs; Dtr in [place]) Type of Home: House Home Access: Stairs to  enter Entrance Stairs-Rails: None Entrance Stairs-Number of Steps: 2 Home Layout: Two  level;Bed/bath upstairs;1/2 bath on main level Home Equipment: Cane - single point;Shower seat - built in      Prior Function Level of Independence: Independent         Comments: Drives     Hand Dominance   Dominant Hand: Right    Extremity/Trunk Assessment   Upper Extremity Assessment Upper Extremity Assessment: Defer to OT evaluation;RUE deficits/detail RUE Deficits / Details: Patient appears to have decreased coordination with fine motor skills on Rt hand (thumb touching each finger) as compared to Lt hand. RUE Coordination: decreased fine motor    Lower Extremity Assessment Lower Extremity Assessment: RLE deficits/detail RLE Deficits / Details: Decreased coordination.  Slightly decreased strength - during leg lift, leg lowering prior to 10 seconds on Rt, and not on Lt. RLE Coordination: decreased gross motor       Communication   Communication: Expressive difficulties  Cognition Arousal/Alertness: Awake/alert Behavior During Therapy: WFL for tasks assessed/performed;Flat affect Overall Cognitive Status: Impaired/Different from baseline Area of Impairment: Memory;Following commands;Problem solving                     Memory: Decreased short-term memory Following Commands: Follows one step commands inconsistently;Follows one step commands with increased time     Problem Solving: Slow processing;Difficulty sequencing;Requires verbal cues (Requires visual cues) General Comments: Patient having difficulty providing home information - wife/daughter assisting.  Patient having difficulty following commands - asked patient to "step over grey square on floor"  Patient stepped on the square x 2 with instructions repeated.  PT demonstrated task and patient able to complete.        General Comments      Exercises     Assessment/Plan    PT Assessment Patient needs continued  PT services  PT Problem List Decreased balance;Decreased mobility;Decreased coordination;Decreased cognition;Decreased knowledge of use of DME       PT Treatment Interventions DME instruction;Gait training;Stair training;Functional mobility training;Therapeutic activities;Therapeutic exercise;Balance training;Neuromuscular re-education;Cognitive remediation;Patient/family education    PT Goals (Current goals can be found in the Care Plan section)  Acute Rehab PT Goals Patient Stated Goal: None stated PT Goal Formulation: With patient/family Time For Goal Achievement: 09/19/16 Potential to Achieve Goals: Good    Frequency Min 4X/week   Barriers to discharge        Co-evaluation               AM-PAC PT "6 Clicks" Daily Activity  Outcome Measure Difficulty turning over in bed (including adjusting bedclothes, sheets and blankets)?: None Difficulty moving from lying on back to sitting on the side of the bed? : A Little Difficulty sitting down on and standing up from a chair with arms (e.g., wheelchair, bedside commode, etc,.)?: A Little Help needed moving to and from a bed to chair (including a wheelchair)?: A Little Help needed walking in hospital room?: A Little Help needed climbing 3-5 steps with a railing? : A Little 6 Click Score: 19    End of Session   Activity Tolerance: Patient tolerated treatment well Patient left: in bed;with call bell/phone within reach;with family/visitor present Nurse Communication: Mobility status (Decreased balance.) PT Visit Diagnosis: Unsteadiness on feet (R26.81);Other symptoms and signs involving the nervous system (R29.898);Hemiplegia and hemiparesis Hemiplegia - Right/Left: Right (Very minor deficits) Hemiplegia - dominant/non-dominant: Dominant Hemiplegia - caused by: Cerebral infarction    Time: 1729-1810 minus 10 min for MD visit PT Time Calculation (min) (ACUTE ONLY): 41 min - 10 min = 31 min total   Charges:  PT  Evaluation $PT Eval Moderate Complexity: 1 Procedure PT Treatments $Gait Training: 8-22 mins   PT G Codes:        Carita Pian. Sanjuana Kava, [facility] Acute Rehab Services Pager [place] 09/12/2016, 8:49 PM

## 2016-09-12 NOTE — Progress Notes (Signed)
*  PRELIMINARY RESULTS* Vascular Ultrasound Carotid Duplex (Doppler) has been completed.  Preliminary findings: Bilateral: No significant (1-39%) ICA stenosis. Antegrade vertebral flow.    Landry Mellow, RDMS, RVT  09/12/2016, 4:46 PM

## 2016-09-12 NOTE — ED Provider Notes (Signed)
[facility] DEPT Provider Note   CSN: [id number] Arrival date & time: 09/12/16  1117     History   Chief Complaint Chief Complaint  Patient presents with  . Stroke Symptoms    HPI Kevin Bass is a 76 y.o. male.  HPI   76 year old male with history of mitral valve reconstruction, colon cancer, hypertension, hyperlipidemia presenting to the ER with stroke symptom. Patient states since 9 AM yesterday he has noticed that he is having difficulty getting his words out. His speech is more broken then usual. Symptoms seem to persist throughout the day. He was hoping to sleep it off but this morning he is still having trouble with his speech which prompted him to come here for further evaluation. He has never had trouble with his speech in the past and has never had any history of stroke. He denies any severe headache, vision changes, diplopia, facial numbness, neck pain, chest pain, trouble breathing, abdominal pain, back pain, focal numbness or weakness, or increased confusion. No recent medication changes. He takes a baby aspirin daily. He denies any recent alcohol or drug use. His last normal was yesterday morning  Past Medical History:  Diagnosis Date  . Allergy   . Anxiety   . Arthritis    hands  . Colon cancer [facility])    s/p partial colectomy 1990, Cscope neg 7-09, next 2014  . DISORDER, MITRAL VALVE    MV recontruction at [place] 2006----still needs ABX for SBE prophylaxis   . Elevated PSA    [name]  . GERD (gastroesophageal reflux disease)   . Heart murmur    no problems since surgery  . HYPERGLYCEMIA, BORDERLINE 10/16/2008  . HYPERLIPIDEMIA 06/15/2006   diet controlled  . Hypertension   . HYPERTROPHY PROSTATE W/O UR OBST & OTH LUTS 10/16/2008  . Right groin pain    Dr. Redmond Pulling  . Wears partial dentures    lower partial    Patient Active Problem List   Diagnosis Date Noted  . GERD (gastroesophageal reflux disease) 09/22/2015  . PCP NOTES >>>>> 11/08/2014  .  Chest pain 06/07/2014  . Facial neuropathy 04/02/2013  . Anxiety 02/26/2013  . Annual physical exam 08/30/2012  . Breath shortness 03/22/2011  . HTN (hypertension) 01/22/2011  . SHOULDER PAIN 04/21/2010  . HYPERTROPHY PROSTATE W/O UR OBST & OTH LUTS 10/16/2008  . Hyperglycemia 10/16/2008  . HEMORRHOIDS, INTERNAL 09/30/2008  . EXTERNAL HEMORRHOIDS 09/12/2008  . Hyperlipidemia 06/15/2006  . DISORDER, MITRAL VALVE 06/15/2006  . ALLERGIC RHINITIS 06/15/2006  . COLON CANCER, HX OF 06/15/2006    Past Surgical History:  Procedure Laterality Date  . COLECTOMY  1990  . COLONOSCOPY    . HERNIA REPAIR Left 2014   [facility] rep w/mesh- March 2014  . MITRAL VALVE ANNULOPLASTY     [facility] 2006       Home Medications    Prior to Admission medications   Medication Sig Start Date End Date Taking? Authorizing Provider  ALPRAZolam (XANAX) 0.25 MG tablet Take 1-2 tablets (0.25-0.5 mg total) by mouth 2 (two) times daily as needed for anxiety. 02/18/16   Colon Branch, MD  ARGININE PO Take 3 each by mouth daily.      [provider]  aspirin 81 MG tablet Take 81 mg by mouth daily.      [provider]  ezetimibe (ZETIA) 10 MG tablet Take 1 tablet (10 mg total) by mouth daily. 09/07/16   Colon Branch, MD  gabapentin (NEURONTIN) 300 MG  capsule Take 1 capsule (300 mg total) by mouth 2 (two) times daily. 06/17/16   Colon Branch, MD  hydrochlorothiazide (HYDRODIURIL) 25 MG tablet Take 25 mg by mouth at bedtime.    [provider]  loratadine (CLARITIN) 10 MG tablet Take 10 mg by mouth daily as needed for allergies.    [provider]  metoprolol tartrate (LOPRESSOR) 25 MG tablet Take 1 tablet (25 mg total) by mouth 2 (two) times daily. 06/07/16   Colon Branch, MD  Misc Natural Products (PROSTATE HEALTH PO) Take 1 tablet by mouth daily.    [provider]  Multiple Vitamins-Minerals (OCUVITE PRESERVISION PO) Take 1 tablet by mouth daily.    [provider]  NON  FORMULARY Blood pressure control supplement- takes 3 daily    [provider]  Omega-3 Fatty Acids (FISH OIL PO) Take by mouth.    [provider]  omeprazole (PRILOSEC) 40 MG capsule Take 1 capsule (40 mg total) by mouth daily. 07/06/16   Colon Branch, MD  Red Yeast Rice Extract (RED YEAST RICE PO) Take 2 each by mouth daily.      [provider]    Family History Family History  Problem Relation Age of Onset  . Hypertension Mother   . Hypertension Sister   . Hypertension Brother   . Colon cancer Other        M side (cousins)  . Diabetes Neg Hx   . Stroke Neg Hx   . CAD Neg Hx   . Prostate cancer Neg Hx   . Rectal cancer Neg Hx   . Esophageal cancer Neg Hx     Social History Social History  Substance Use Topics  . Smoking status: Former Smoker    Packs/day: 0.50    Years: 20.00    Types: Cigarettes    Quit date: 02/23/1983  . Smokeless tobacco: Never Used  . Alcohol use 4.2 oz/week    7 Glasses of wine per week     Allergies   Statins   Review of Systems Review of Systems  All other systems reviewed and are negative.    Physical Exam Updated Vital Signs BP (!) 159/103   Pulse 62   Temp 98 F (36.7 C) (Oral)   Resp 18   Ht 5\' 10"  (1.778 m)   Wt 79.4 kg (175 lb)   SpO2 99%   BMI 25.11 kg/m   Physical Exam  Constitutional: He is oriented to person, place, and time. He appears well-developed and well-nourished. No distress.  HENT:  Head: Atraumatic.  Eyes: Pupils are equal, round, and reactive to light. Conjunctivae and EOM are normal.  Neck: Normal range of motion. Neck supple.  No nuchal rigidity  Cardiovascular: Normal rate and regular rhythm.   No murmur heard. Pulmonary/Chest: Effort normal and breath sounds normal.  Abdominal: Soft. He exhibits no distension. There is no tenderness.  Neurological: He is alert and oriented to person, place, and time. He has normal strength. No cranial nerve deficit or sensory deficit. He  displays a negative Romberg sign. Coordination and gait normal. GCS eye subscore is 4. GCS verbal subscore is 5. GCS motor subscore is 6.  Neurologic exam:  Speech is mildly dysarthric, pupils equal round reactive to light, extraocular movements intact  Normal peripheral visual fields Cranial nerves III through XII normal including no facial droop Follows commands, moves all extremities x4, normal strength to bilateral upper and lower extremities at all major muscle groups  including grip Sensation normal to light touch  Coordination intact, no limb ataxia, finger-nose-finger normal Rapid alternating movements normal No pronator drift Gait normal   Skin: No rash noted.  Psychiatric: He has a normal mood and affect.  Nursing note and vitals reviewed.    ED Treatments / Results  Labs (all labs ordered are listed, but only abnormal results are displayed) Labs Reviewed  COMPREHENSIVE METABOLIC PANEL - Abnormal; Notable for the following:       Result Value   Chloride 100 (*)    Glucose, Bld 106 (*)    ALT 15 (*)    All other components within normal limits  PROTIME-INR  APTT  CBC  DIFFERENTIAL  I-STAT TROPONIN, ED    EKG  EKG Interpretation  Date/Time:  Sunday September 12 2016 11:22:46 EDT Ventricular Rate:  74 PR Interval:  148 QRS Duration: 84 QT Interval:  378 QTC Calculation: 419 R Axis:   7 Text Interpretation:  Normal sinus rhythm Possible Anterior infarct , age undetermined Abnormal ECG No old tracing to compare Confirmed by Jola Schmidt [phone]) on 09/12/2016 1:20:22 PM       Radiology Mr Brain Wo Contrast  Result Date: 09/12/2016 CLINICAL DATA:  Dysarthria.  Slurred speech EXAM: MRI HEAD WITHOUT CONTRAST TECHNIQUE: Multiplanar, multiecho pulse sequences of the brain and surrounding structures were obtained without intravenous contrast. COMPARISON:  None. FINDINGS: Brain: Acute infarct in the left posterior insula and parietal operculum. No other acute infarct. No  significant chronic ischemia. Ventricle size normal. Negative for hemorrhage or mass. Vascular: Normal arterial flow void Skull and upper cervical spine: Negative Sinuses/Orbits: Mild mucosal edema paranasal sinuses.  Normal orbit Other: None IMPRESSION: Acute infarct left posterior insula and parietal operculum. Otherwise negative Electronically Signed   By: Franchot Gallo M.D.   On: 09/12/2016 13:41    Procedures Procedures (including critical care time)  Medications Ordered in ED Medications  LORazepam (ATIVAN) tablet 1 mg (1 mg Oral Given 09/12/16 1228)     Initial Impression / Assessment and Plan / ED Course  I have reviewed the triage vital signs and the nursing notes.  Pertinent labs & imaging results that were available during my care of the patient were reviewed by me and considered in my medical decision making (see chart for details).     BP (!) 159/103   Pulse 62   Temp 98 F (36.7 C)   Resp 18   Ht 5\' 10"  (1.778 m)   Wt 79.4 kg (175 lb)   SpO2 99%   BMI 25.11 kg/m    Final Clinical Impressions(s) / ED Diagnoses   Final diagnoses:  Acute ischemic stroke [facility])    New Prescriptions New Prescriptions   No medications on file   11:57 AM Patient here with complaints of dysarthric speech. Last normal yesterday morning. Stroke Workup initiated.  Care discussed with Dr. Venora Maples.    1:55 PM  Brain MRI demonstrate an acute infarct involving the left posterior insula and parietal operculum.   Labs are reassuring.  I have notified oncall neurologist Dr. Leonel Ramsay.  I appreciate consultation from [facility] who will see and admit pt for further care. Pt made aware of plan and agrees with plan.     Domenic Moras, PA-C 09/12/16 [place], MD 09/13/16 [phone]

## 2016-09-12 NOTE — Progress Notes (Signed)
Patient is admitted to 5M10 from ED. Admission VS is stable. Patient is AO X4 with family at the bedside

## 2016-09-12 NOTE — Consult Note (Signed)
Neurology Consultation Reason for Consult: Aphasia Referring Physician: Joyice Faster  CC: Aphasia  History is obtained from: Patient, family  HPI: Kevin Bass is a 76 y.o. male with a history of colon cancer, hyperlipidemia, previous mitral valve repair (not replacement) who presents with aphasia that started at 63 AM yesterday. He states that he has already noticed some improvement. He denies any numbness, weakness, difficult due to vision, or other signs. He presented to the emergency Department and evaluation of this and an MRI was performed which demonstrated a left cortical infarct.   LKW: 9 AM 7/21 tpa given?: no, outside of window    ROS: A 14 point ROS was performed and is negative except as noted in the HPI.  Past Medical History:  Diagnosis Date  . Allergy   . Anxiety   . Arthritis    hands  . Colon cancer [facility])    s/p partial colectomy 1990, Cscope neg 7-09, next 2014  . DISORDER, MITRAL VALVE    MV recontruction at [place] 2006----still needs ABX for SBE prophylaxis   . Elevated PSA    [name]  . GERD (gastroesophageal reflux disease)   . Heart murmur    no problems since surgery  . HYPERGLYCEMIA, BORDERLINE 10/16/2008  . HYPERLIPIDEMIA 06/15/2006   diet controlled  . Hypertension   . HYPERTROPHY PROSTATE W/O UR OBST & OTH LUTS 10/16/2008  . Right groin pain    Dr. Redmond Pulling  . Wears partial dentures    lower partial     Family History  Problem Relation Age of Onset  . Hypertension Mother   . Hypertension Sister   . Hypertension Brother   . Colon cancer Other        M side (cousins)  . Diabetes Neg Hx   . Stroke Neg Hx   . CAD Neg Hx   . Prostate cancer Neg Hx   . Rectal cancer Neg Hx   . Esophageal cancer Neg Hx      Social History:  reports that he quit smoking about 33 years ago. His smoking use included Cigarettes. He has a 10.00 pack-year smoking history. He has never used smokeless tobacco. He reports that he drinks about 4.2 oz of alcohol per  week . He reports that he does not use drugs.   Exam: Current vital signs: BP 114/64 (BP Location: Right Arm)   Pulse 70   Temp 98.6 F (37 C) (Oral)   Resp 18   Ht 5\' 10"  (1.778 m)   Wt 79.4 kg (175 lb)   SpO2 98%   BMI 25.11 kg/m  Vital signs in last 24 hours: Temp:  [98 F (36.7 C)-98.6 F (37 C)] 98.6 F (37 C) (07/22 1727) Pulse Rate:  [62-70] 70 (07/22 1727) Resp:  [18] 18 (07/22 1527) BP: (114-159)/(64-103) 114/64 (07/22 1727) SpO2:  [98 %-99 %] 98 % (07/22 1727) Weight:  [74.4 kg (164 lb)-79.4 kg (175 lb)] 74.4 kg (164 lb) (07/22 1435)   Physical Exam  Constitutional: Appears well-developed and well-nourished.  Psych: Affect appropriate to situation Eyes: No scleral injection HENT: No OP obstrucion Head: Normocephalic.  Cardiovascular: Normal rate and regular rhythm.  Respiratory: Effort normal and breath sounds normal to anterior ascultation GI: Soft.  No distension. There is no tenderness.  Skin: WDI  Neuro: Mental Status: Patient is awake, alert, oriented to person, place, month, year, and situation. No signs of  Neglect. He has mild difficult to with word finding, but is able to give  a pretty coherent history. Cranial Nerves: II: Visual Fields are full. Pupils are equal, round, and reactive to light.   III,IV, VI: EOMI without ptosis or diploplia.  V: Facial sensation is symmetric to temperature VII: Facial movement is symmetric.  VIII: hearing is intact to voice X: Uvula elevates symmetrically XI: Shoulder shrug is symmetric. XII: tongue is midline without atrophy or fasciculations.  Motor: Tone is normal. Bulk is normal. 5/5 strength was present in all four extremities.  Sensory: Sensation is symmetric to light touch and temperature in the arms and legs. Cerebellar: FNF are intact bilaterally  I have reviewed labs in epic and the results pertinent to this consultation are: BMP, CBC-unremarkable  I have reviewed the images obtained: MRI  brain-infarct with several small satellite areas that suggests embolic phenomena.  Impression: 76 year old male with likely embolic infarct. He will need further evaluation for embolic source, could be artery to artery or cardioembolic.  Recommendations: 1. HgbA1c, fasting lipid panel 2. Frequent neuro checks 3. Echocardiogram 4. Carotid dopplers 5. Prophylactic therapy-Antiplatelet med: Aspirin - dose 325mg  PO or 300mg  PR 6. Risk factor modification 7. Telemetry monitoring 8. PT consult, OT consult, Speech consult 9. please page stroke NP  Or  PA  Or MD  from 8am -4 pm as this patient will be followed by the stroke team at this point.   You can look them up on [email or web address]     Roland Rack, MD [facility] [phone]  If 7pm- 7am, please page neurology on call as listed in [place].

## 2016-09-12 NOTE — ED Notes (Signed)
Patient transported to MRI 

## 2016-09-12 NOTE — ED Triage Notes (Signed)
Patient complains of difficulty with speech that started around 1030am yesterday. Speech slightly slurred on assessment and patient with ongoing expressive aphasia. No pain, alert and oriented, no arm drift, no facial droop

## 2016-09-12 NOTE — H&P (Signed)
History and Physical    Kevin Bass PYK:[id number] DOB: 1940/09/05 DOA: 09/12/2016  PCP: Colon Branch, MD Patient coming from: home  Chief Complaint: slurred speech  HPI: Kevin Bass is a very pleasant 76 y.o. male with medical history significant hypertension, hyperlipidemia, GERD mitral valve reconstruction 2006, colon cancer status post partial colectomy 1990 presents to the emergency Department chief complaint of slurred speech. Initial evaluation in the emergency department includes an MRI of the brain revealing acute infarct.  Information is obtained from the chart the patient and his wife who is at the bedside. He states he was in his usual state of health until yesterday around 9 AM he noticed he was having difficulty getting his words out. Wife states his speech wasn't necessarily slurred but that he was "having trouble finding his words". Symptoms persisted throughout yesterday. He woke up this morning and symptoms were no better or no worse so he decided to come to the emergency department. He denies headache dizziness syncope or near-syncope. He denies visual changes difficulty chewing or swallowing. He denies any numbness tingling or weakness of his extremities. He denies chest pain palpitations shortness of breath nausea vomiting. He denies any lower extremity edema orthopnea abdominal pain. He denies dysuria hematuria frequency or urgency.    ED Course: In the emergency department he's afebrile hemodynamically stable with a blood pressure the high end of normal he's not hypoxic.  Review of Systems: As per HPI otherwise all other systems reviewed and are negative.   Ambulatory Status: Ambulates independently is independent with ADLs  Past Medical History:  Diagnosis Date  . Allergy   . Anxiety   . Arthritis    hands  . Colon cancer [facility])    s/p partial colectomy 1990, Cscope neg 7-09, next 2014  . DISORDER, MITRAL VALVE    MV recontruction at [place] 2006----still  needs ABX for SBE prophylaxis   . Elevated PSA    [name]  . GERD (gastroesophageal reflux disease)   . Heart murmur    no problems since surgery  . HYPERGLYCEMIA, BORDERLINE 10/16/2008  . HYPERLIPIDEMIA 06/15/2006   diet controlled  . Hypertension   . HYPERTROPHY PROSTATE W/O UR OBST & OTH LUTS 10/16/2008  . Right groin pain    Dr. Redmond Pulling  . Wears partial dentures    lower partial    Past Surgical History:  Procedure Laterality Date  . COLECTOMY  1990  . COLONOSCOPY    . HERNIA REPAIR Left 2014   [facility] rep w/[facility]- March 2014  . MITRAL VALVE ANNULOPLASTY     [facility] 2006    Social History   Social History  . Marital status: Married    Spouse name: suzanne  . Number of children: 2  . Years of education: college   Occupational History  . retired     Social History Main Topics  . Smoking status: Former Smoker    Packs/day: 0.50    Years: 20.00    Types: Cigarettes    Quit date: 02/23/1983  . Smokeless tobacco: Never Used  . Alcohol use 4.2 oz/week    7 Glasses of wine per week  . Drug use: No  . Sexual activity: Not on file   Other Topics Concern  . Not on file   Social History Narrative   From [place], household pt and wife   Daughter in [place], son in [place]          Allergies  Allergen Reactions  . Statins Other (  See Comments)    Other reaction(s): Myalgias (intolerance)    Family History  Problem Relation Age of Onset  . Hypertension Mother   . Hypertension Sister   . Hypertension Brother   . Colon cancer Other        M side (cousins)  . Diabetes Neg Hx   . Stroke Neg Hx   . CAD Neg Hx   . Prostate cancer Neg Hx   . Rectal cancer Neg Hx   . Esophageal cancer Neg Hx     Prior to Admission medications   Medication Sig Start Date End Date Taking? Authorizing Provider  ALPRAZolam (XANAX) 0.25 MG tablet Take 1-2 tablets (0.25-0.5 mg total) by mouth 2 (two) times daily as needed for anxiety. 02/18/16  Yes [name], Alda Berthold, MD  ARGININE PO Take 3 tablets by  mouth daily.    Yes [provider]  aspirin 81 MG tablet Take 81 mg by mouth at bedtime.    Yes [provider]  ezetimibe (ZETIA) 10 MG tablet Take 1 tablet (10 mg total) by mouth daily. Patient taking differently: Take 10 mg by mouth at bedtime.  09/07/16  Yes [name], Alda Berthold, MD  gabapentin (NEURONTIN) 300 MG capsule Take 1 capsule (300 mg total) by mouth 2 (two) times daily. 06/17/16  Yes [name], Alda Berthold, MD  hydrochlorothiazide (HYDRODIURIL) 25 MG tablet Take 25 mg by mouth at bedtime.   Yes [provider]  loratadine (CLARITIN) 10 MG tablet Take 10 mg by mouth daily as needed for allergies.   Yes [provider]  metoprolol tartrate (LOPRESSOR) 25 MG tablet Take 1 tablet (25 mg total) by mouth 2 (two) times daily. 06/07/16  Yes [name], Alda Berthold, MD  Misc Natural Products (PROSTATE HEALTH PO) Take 1 tablet by mouth daily.   Yes [provider]  Multiple Vitamins-Minerals (OCUVITE PRESERVISION PO) Take 1 tablet by mouth 2 (two) times daily.    Yes [provider]  Omega-3 Fatty Acids (FISH OIL PO) Take 1 capsule by mouth daily.    Yes [provider]  omeprazole (PRILOSEC) 40 MG capsule Take 1 capsule (40 mg total) by mouth daily. Patient taking differently: Take 40 mg by mouth daily as needed (for acid reflux).  07/06/16  Yes [name], Alda Berthold, MD  Red Yeast Rice Extract (RED YEAST RICE PO) Take 2 tablets by mouth daily.    Yes [provider]    Physical Exam: Vitals:   09/12/16 1123 09/12/16 1124 09/12/16 1200  BP: (!) 159/103    Pulse: 62    Resp: 18    Temp: 98 F (36.7 C)  98 F (36.7 C)  TempSrc: Oral    SpO2: 99%    Weight:  79.4 kg (175 lb)   Height:  5\' 10"  (1.778 m)      General:  Appears calm and comfortable, sitting up in bed about 8 Eyes:  PERRL, EOMI, normal lids, iris ENT:  grossly normal hearing, lips & tongue, mucous membranes of his mouth are moist and pink Neck:  no LAD, masses or  thyromegaly Cardiovascular:  RRR, +murmur No LE edema. Pulses present and palpable Respiratory:  CTA bilaterally, no w/r/r. Normal respiratory effort. Abdomen:  soft, ntnd, positive bowel sounds throughout no guarding or rebounding Skin:  no rash or induration seen on limited exam Musculoskeletal:  grossly normal tone BUE/BLE, good ROM, no bony abnormality Psychiatric:  grossly normal mood and affect, speech fluent and appropriate, AOx3 Neurologic:  CN  2-12 grossly intact, moves all extremities in coordinated fashion, sensation intact. Speech clear but he does demonstrate some word searching tongue midline no pronator drift bilateral grip 5 out of 5 lower extremity strength 5 out of 5 bilaterally sensation intact  Labs on Admission: I have personally reviewed following labs and imaging studies  CBC:  Recent Labs Lab 09/12/16 1125  WBC 6.2  NEUTROABS 4.3  HGB 15.0  HCT 44.2  MCV 84.4  PLT 614   Basic Metabolic Panel:  Recent Labs Lab 09/12/16 1125  NA 138  K 3.8  CL 100*  CO2 30  GLUCOSE 106*  BUN 15  CREATININE 1.04  CALCIUM 9.4   GFR: Estimated Creatinine Clearance: 60.4 mL/min (by C-G formula based on SCr of 1.04 mg/dL). Liver Function Tests:  Recent Labs Lab 09/12/16 1125  AST 21  ALT 15*  ALKPHOS 41  BILITOT 1.1  PROT 7.0  ALBUMIN 4.1   No results for input(s): LIPASE, AMYLASE in the last 168 hours. No results for input(s): AMMONIA in the last 168 hours. Coagulation Profile:  Recent Labs Lab 09/12/16 1125  INR 1.08   Cardiac Enzymes: No results for input(s): CKTOTAL, CKMB, CKMBINDEX, TROPONINI in the last 168 hours. BNP (last 3 results) No results for input(s): PROBNP in the last 8760 hours. HbA1C: No results for input(s): HGBA1C in the last 72 hours. CBG: No results for input(s): GLUCAP in the last 168 hours. Lipid Profile: No results for input(s): CHOL, HDL, LDLCALC, TRIG, CHOLHDL, LDLDIRECT in the last 72 hours. Thyroid Function Tests: No  results for input(s): TSH, T4TOTAL, FREET4, T3FREE, THYROIDAB in the last 72 hours. Anemia Panel: No results for input(s): VITAMINB12, FOLATE, FERRITIN, TIBC, IRON, RETICCTPCT in the last 72 hours. Urine analysis: No results found for: COLORURINE, APPEARANCEUR, LABSPEC, PHURINE, GLUCOSEU, HGBUR, BILIRUBINUR, KETONESUR, PROTEINUR, UROBILINOGEN, NITRITE, LEUKOCYTESUR  Creatinine Clearance: Estimated Creatinine Clearance: 60.4 mL/min (by C-G formula based on SCr of 1.04 mg/dL).  Sepsis Labs: @LABRCNTIP (procalcitonin:4,lacticidven:4) )No results found for this or any previous visit (from the past 240 hour(s)).   Radiological Exams on Admission: Mr Brain Wo Contrast  Result Date: 09/12/2016 CLINICAL DATA:  Dysarthria.  Slurred speech EXAM: MRI HEAD WITHOUT CONTRAST TECHNIQUE: Multiplanar, multiecho pulse sequences of the brain and surrounding structures were obtained without intravenous contrast. COMPARISON:  None. FINDINGS: Brain: Acute infarct in the left posterior insula and parietal operculum. No other acute infarct. No significant chronic ischemia. Ventricle size normal. Negative for hemorrhage or mass. Vascular: Normal arterial flow void Skull and upper cervical spine: Negative Sinuses/Orbits: Mild mucosal edema paranasal sinuses.  Normal orbit Other: None IMPRESSION: Acute infarct left posterior insula and parietal operculum. Otherwise negative Electronically Signed   By: Franchot Gallo M.D.   On: 09/12/2016 13:41    EKG: Independently reviewed. Normal sinus rhythm Possible Anterior infarct , age undetermined Abnormal ECG  Assessment/Plan Principal Problem:   Acute cerebral infarction [facility]) Active Problems:   Hyperglycemia   HTN (hypertension)   Anxiety   GERD (gastroesophageal reflux disease)   #1. Acute cerebral infarction. Risk factors include HTN, hypercholesterolemia, age, family.  MRI reveals acute infarct left posterior insula and parietal operculum. Continues with word  searching on admission -Admit to telemetry -Obtain MRA of the brain -chest xray -Obtain carotid Dopplers -2-D echo -Obtain lipid panel hemoglobin A1c -Aspirin -Patient with intolerance to statins -Occupational therapy, physical therapy consults -Heart healthy diet as he passed a bedside swallow eval -Await neurology recommendations  #2. Hypertension. Blood pressure high end of normal  while in the emergency department. Home medications include HCTZ and metoprolol -will hold these for now -monitor BP closely -resume anti-hypertensive medications when indicated  #3. Anxiety. Appears stable at baseline -continue home benzo    DVT prophylaxis: lovenox  Code Status: full  Family Communication: wife and daughter at bedside  Disposition Plan: home  Consults called: [facility]  Admission status: inpatient    Radene Gunning MD [facility]  If 7PM-7AM, please contact night-coverage [email or web address] Password [facility]  09/12/2016, 2:37 PM

## 2016-09-13 ENCOUNTER — Other Ambulatory Visit (HOSPITAL_COMMUNITY): Payer: PPO

## 2016-09-13 ENCOUNTER — Inpatient Hospital Stay (HOSPITAL_COMMUNITY): Payer: PPO

## 2016-09-13 DIAGNOSIS — I639 Cerebral infarction, unspecified: Secondary | ICD-10-CM

## 2016-09-13 LAB — ECHOCARDIOGRAM COMPLETE
CHL CUP TV REG PEAK VELOCITY: 202 cm/s
E/e' ratio: 10.26
EWDT: 317 ms
FS: 35 % (ref 28–44)
Height: 70 in
IVS/LV PW RATIO, ED: 1.03
LADIAMINDEX: 2.19 cm/m2
LASIZE: 42 mm
LAVOL: 48.6 mL
LAVOLA4C: 37.6 mL
LAVOLIN: 25.3 mL/m2
LEFT ATRIUM END SYS DIAM: 42 mm
LV PW d: 11 mm — AB (ref 0.6–1.1)
LV TDI E'MEDIAL: 5.87
LV e' LATERAL: 11.4 cm/s
LVEEAVG: 10.26
LVEEMED: 10.26
LVOT area: 4.15 cm2
LVOTD: 23 mm
MV Dec: 317
MV Peak grad: 5 mmHg
MV pk E vel: 117 m/s
MVPKAVEL: 108 m/s
PV Reg grad dias: 6 mmHg
PV Reg vel dias: 124 cm/s
RV LATERAL S' VELOCITY: 9.03 cm/s
RV TAPSE: 20.9 mm
RV sys press: 19 mmHg
TDI e' lateral: 11.4
TRMAXVEL: 202 cm/s
Weight: 2624 oz

## 2016-09-13 LAB — VAS US CAROTID
LCCAPSYS: 176 cm/s
LEFT ECA DIAS: -1 cm/s
LEFT VERTEBRAL DIAS: 9 cm/s
Left CCA dist dias: -20 cm/s
Left CCA dist sys: -107 cm/s
Left CCA prox dias: 17 cm/s
Left ICA dist dias: -28 cm/s
Left ICA dist sys: -87 cm/s
Left ICA prox dias: -16 cm/s
Left ICA prox sys: -90 cm/s
RCCADSYS: -75 cm/s
RIGHT ECA DIAS: -3 cm/s
RIGHT VERTEBRAL DIAS: 13 cm/s
Right CCA prox dias: 9 cm/s
Right CCA prox sys: 99 cm/s

## 2016-09-13 LAB — URINALYSIS, COMPLETE (UACMP) WITH MICROSCOPIC
BILIRUBIN URINE: NEGATIVE
Bacteria, UA: NONE SEEN
GLUCOSE, UA: NEGATIVE mg/dL
HGB URINE DIPSTICK: NEGATIVE
KETONES UR: NEGATIVE mg/dL
LEUKOCYTES UA: NEGATIVE
NITRITE: NEGATIVE
PH: 6 (ref 5.0–8.0)
PROTEIN: NEGATIVE mg/dL
Specific Gravity, Urine: 1.011 (ref 1.005–1.030)
Squamous Epithelial / LPF: NONE SEEN
WBC UA: NONE SEEN WBC/hpf (ref 0–5)

## 2016-09-13 MED ORDER — ASPIRIN 300 MG RE SUPP: 300.0000 mg | Freq: Every day | RECTAL | Status: DC

## 2016-09-13 MED ORDER — ASPIRIN 325 MG PO TABS: 325.0000 mg | ORAL_TABLET | Freq: Every day | ORAL | Status: DC

## 2016-09-13 MED ORDER — UNABLE TO FIND: 0 refills | Status: DC

## 2016-09-13 MED ORDER — CLOPIDOGREL BISULFATE 75 MG PO TABS
75.0000 mg | ORAL_TABLET | Freq: Every day | ORAL | Status: DC
  Administered 2016-09-13 – 2016-09-15 (×3): 75 mg via ORAL
  Filled 2016-09-13 (×3): qty 1

## 2016-09-13 MED ORDER — CLOPIDOGREL BISULFATE 75 MG PO TABS: 75.0000 mg | ORAL_TABLET | Freq: Every day | ORAL | 4 refills | Status: DC

## 2016-09-13 NOTE — Progress Notes (Signed)
STROKE TEAM PROGRESS NOTE   HISTORY OF PRESENT ILLNESS (per record) Kevin Bass is a 76 y.o. male with a history of colon cancer, hyperlipidemia, previous mitral valve repair (not replacement) who presents with aphasia that started at 9 AM on 09/11/2016. He stated that he had already noticed some improvement during his initial Neurology consultation on 09/12/2016.  He denies any numbness, weakness, difficult due to vision, or other signs. He presented to the emergency Department for evaluation of his aphasia and an MRI was performed which demonstrated a left cortical infarct.  LKW: 9 AM 09/11/2016  Patient was not administered IV t-PA secondary to arriving outside of the treatment window. He was admitted to General Neurology for further evaluation and treatment.   SUBJECTIVE (INTERVAL HISTORY) His wife is at the bedside.  The patient is awake, alert, and follows all commands appropriately.  The patient endorses continued difficulty with word-finding, but states his overall speech continues to improve.  TEE and loop pending.   OBJECTIVE Temp:  [97.5 F (36.4 C)-98.6 F (37 C)] 97.7 F (36.5 C) (07/23 0330) Pulse Rate:  [58-70] 67 (07/23 0330) Cardiac Rhythm: Normal sinus rhythm (07/23 0700) Resp:  [16-20] 16 (07/23 0330) BP: (102-159)/(57-103) 102/62 (07/23 0330) SpO2:  [95 %-99 %] 98 % (07/23 0330) Weight:  [74.4 kg (164 lb)-79.4 kg (175 lb)] 74.4 kg (164 lb) (07/22 1435)  CBC:  Recent Labs Lab 09/12/16 1125  WBC 6.2  NEUTROABS 4.3  HGB 15.0  HCT 44.2  MCV 84.4  PLT 644    Basic Metabolic Panel:  Recent Labs Lab 09/12/16 1125  NA 138  K 3.8  CL 100*  CO2 30  GLUCOSE 106*  BUN 15  CREATININE 1.04  CALCIUM 9.4    Lipid Panel:    Component Value Date/Time   CHOL 185 09/12/2016 1430   TRIG 94 09/12/2016 1430   HDL 42 09/12/2016 1430   CHOLHDL 4.4 09/12/2016 1430   VLDL 19 09/12/2016 1430   LDLCALC 124 (H) 09/12/2016 1430   HgbA1c:  Lab Results   Component Value Date   HGBA1C 5.5 08/06/2015   Urine Drug Screen: No results found for: LABOPIA, COCAINSCRNUR, LABBENZ, AMPHETMU, THCU, LABBARB  Alcohol Level No results found for: [facility]  IMAGING  Dg Chest 2 View 09/12/2016 IMPRESSION: Normal chest radiographs.  Mr Brain Wo Contrast 09/12/2016 IMPRESSION: Acute infarct left posterior insula and parietal operculum. Otherwise negative   Mr Kevin Bass Head/brain Wo Cm 09/12/2016 IMPRESSION: No large or medium vessel pathologic finding. No evidence of correctable left MCA stenosis. No discernible missing branch vessel on the left.   Carotid US 09/12/2016 Summary: - The vertebral arteries appear patent with antegrade flow. - Findings consistent with 1- 39 percent stenosis involving the right internal carotid artery and the left internal carotid artery.  2D Echo 09/13/2016 Impressions: - LVEF 60-65%, mild LVH, normal wall motion, grade 1 DD, indeterminate LV filling pressure, prior MV repair without significant stenosis or regurgitation, normal LA size, upper normal RA size, trivial TR, RVSP 19 mmHg, normal IVC.  TEE 09/14/2016 pending   PHYSICAL EXAM Pleasant elderly caucasian male not in distress. . Afebrile. Head is nontraumatic. Neck is supple without bruit.    Cardiac exam no murmur or gallop. Lungs are clear to auscultation. Distal pulses are well felt. Neurological Exam ;  Awake  Alert oriented x 3. Normal speech and language except occasional word finding difficulty. Slight diff.eye movements full without nystagmus.fundi were not visualized. Vision acuity and fields appear normal. Hearing  is normal. Palatal movements are normal. Face symmetric. Tongue midline. Normal strength, tone, reflexes and coordination. Normal sensation. Gait deferred.  ASSESSMENT/PLAN [name] is a 76 y.o. male with history of colon cancer, hyperlipidemia, previous mitral valve repair (not replacement) who presents with aphasia that started at  78 AM on 09/11/2016.  He did not receive IV t-PA due to arriving outside of the treatment window.  Stroke: Acute infarct left posterior insula and parietal operculum, likely embolic, of undetermined etiology  Resultant mild aphasia  MRI head: Acute infarct left posterior insula and parietal operculum.  MRA head: No LVO or high-grade stenosis.  Carotid Doppler: B ICA 1-39% stenosis, VAs antegrade  2D Echo: Grade 1 diastolic dysfunction.  EF 60-65%. No source of embolus   LDL 124  HgbA1c 5.5  Lovenox 40 mg sq daily for VTE prophylaxis  Diet Heart Room service appropriate? Yes; Fluid consistency: Thin  aspirin 81 mg daily prior to admission, now on clopidogrel 75 mg daily  Patient counseled to be compliant with his antithrombotic medications  Ongoing aggressive stroke risk factor management  Therapy recommendations: None  Disposition:  pending  Hypertension  Stable  Permissive hypertension (OK if < 220/120) but gradually normalize in 5-7 days  Long-term BP goal normotensive  Hyperlipidemia  Home meds: none  LDL 124, goal < 70  Statins held due to documented intolerance  Other Stroke Risk Factors  Advanced age  ETOH use, advised to drink no more than 2 drink(s) a day  History of cancer  History of mitral valve repair  Other Active Problems  None  Hospital day # 1  I have personally examined this patient, reviewed notes, independently viewed imaging studies, participated in medical decision making and plan of care.ROS completed by me personally and pertinent positives fully documented  I have made any additions or clarifications directly to the above note.  He presented with aphasia secondary to left MCA branch infarct  And has made near complete recovery. He needs evaluation for cardiac source of embolism including transesophageal echocardiogram and loop recorder for paroxysmal A. Fib. Change aspirin to Plavix for stroke prevention and add statin for elevated  lipids.discussed with patient and wife and answered questions. Greater than 50% time during this 35 minute visit was spent on counseling and coordination of care about his embolic stroke, discussion about plan for diagnosis and treatment and answering questions Kevin Contras, MD Medical Director [place] Pager: [phone] 09/13/2016 5:28 PM   To contact Stroke Continuity provider, please refer to [email or web address] After hours, contact General Neurology

## 2016-09-13 NOTE — Progress Notes (Signed)
    [facility] has been requested to perform a transesophageal echocardiogram on Kevin Bass for cerebrovascular accident.  After careful review of history and examination, the risks and benefits of transesophageal echocardiogram have been explained including risks of esophageal damage, perforation (1:10,000 risk), bleeding, pharyngeal hematoma as well as other potential complications associated with conscious sedation including aspiration, arrhythmia, respiratory failure and death. Alternatives to treatment were discussed, questions were answered. Patient is willing to proceed.   Hgb stable at 15.0, platelets 204. BP stable with no requirement for pressors. TEE is scheduled for 09/14/2016 at 1500.  Erma Heritage, PA-C  09/13/2016 3:02 PM

## 2016-09-13 NOTE — Progress Notes (Signed)
  Echocardiogram 2D Echocardiogram has been performed.  Kevin Bass 09/13/2016, 9:03 AM

## 2016-09-13 NOTE — Progress Notes (Signed)
SLP Cancellation Note  Patient Details Name: Kevin Bass MRN: [id number] DOB: 1940/09/12   Cancelled treatment:       Reason Eval/Treat Not Completed: Patient at procedure or test/unavailable. Will reattempt later this morning for cognitive-linguistic eval.   Kern Reap, MA, CCC-SLP 09/13/2016, 8:43 AM

## 2016-09-13 NOTE — Evaluation (Signed)
Occupational Therapy Evaluation Patient Details Name: Kevin Bass MRN: [id number] DOB: [birth year] Today's Date: 09/13/2016    History of Present Illness Kevin Bass is a 76 y.o. male with a history of colon cancer, hyperlipidemia, previous mitral valve repair (not replacement) who presents with aphasia.  MRI was performed which demonstrated a left cortical infarct.   Clinical Impression   PTA, pt was independent with ADL and functional mobility. He currently is able to complete basic transfers with modified independence but benefits from supervision during standing ADL and functional mobility to maximize safety. He presents with slightly decreased R hand coordination and deficits in higher level cognition impacting his safety and independence with IADL tasks. He would benefit from further OT services to maximize safety and independence with ADL and IADL participation. Do not anticipate need for OT follow-up post-acute D/C but will continue to update recommendations as necessary. Will continue to follow while admitted.     Follow Up Recommendations  No OT follow up;Supervision/Assistance - 24 hour    Equipment Recommendations  None recommended by OT    Recommendations for Other Services       Precautions / Restrictions Precautions Precautions: Fall Restrictions Weight Bearing Restrictions: No      Mobility Bed Mobility Overal bed mobility: Modified Independent             General bed mobility comments: Increased time  Transfers Overall transfer level: Modified independent Equipment used: None Transfers: Sit to/from Stand Sit to Stand: Modified independent (Device/Increase time)         General transfer comment: Increased time.     Balance Overall balance assessment: Needs assistance Sitting-balance support: No upper extremity supported;Feet supported Sitting balance-Leahy Scale: Good     Standing balance support: No upper extremity  supported Standing balance-Leahy Scale: Good                             ADL either performed or assessed with clinical judgement   ADL Overall ADL's : Needs assistance/impaired                                       General ADL Comments: Able to complete basic ADL supervision for safety. Higher level cognitive deficits impacting IADL participation.      Vision Patient Visual Report: No change from baseline Vision Assessment?: No apparent visual deficits Additional Comments: Able to functionally utilize vision     Perception     Praxis Praxis Praxis tested?: Within functional limits    Pertinent Vitals/Pain Pain Assessment: No/denies pain     Hand Dominance Right   Extremity/Trunk Assessment Upper Extremity Assessment Upper Extremity Assessment: RUE deficits/detail RUE Deficits / Details: Functional but slight difficulty with opening/closing toothpaste bottle.  RUE Coordination: decreased fine motor   Lower Extremity Assessment Lower Extremity Assessment: Defer to PT evaluation       Communication Communication Communication: Expressive difficulties   Cognition Arousal/Alertness: Awake/alert Behavior During Therapy: WFL for tasks assessed/performed;Flat affect Overall Cognitive Status: Impaired/Different from baseline Area of Impairment: Problem solving;Following commands;Memory                     Memory: Decreased short-term memory Following Commands: Follows one step commands inconsistently;Follows multi-step commands with increased time     Problem Solving: Slow processing;Requires verbal cues General Comments: Pt requiring slightly increased time for processing. Noted  difficulty with serial 7's in a moderately distracting environment. Able to complete path-finding tasks in hallway.    General Comments       Exercises     Shoulder Instructions      Home Living Family/patient expects to be discharged to:: Private  residence Living Arrangements: Spouse/significant other Available Help at Discharge: Family;Available 24 hours/day Type of Home: House Home Access: Stairs to enter [employer] of Steps: 2 Entrance Stairs-Rails: None Home Layout: Two level;Bed/bath upstairs;1/2 bath on main level Alternate Level Stairs-Number of Steps: 15 Alternate Level Stairs-Rails: Right Bathroom Shower/Tub: Walk-in shower         Home Equipment: Cane - single point;Shower seat - built in      Lives With: Spouse    Prior Functioning/Environment Level of Independence: Independent        Comments: Drives        OT Problem List: Decreased coordination;Decreased cognition      OT Treatment/Interventions: Self-care/ADL training;Therapeutic exercise;Therapeutic activities;Cognitive remediation/compensation;Patient/family education    OT Goals(Current goals can be found in the care plan section) Acute Rehab OT Goals Patient Stated Goal: None stated OT Goal Formulation: With patient Time For Goal Achievement: 09/27/16 Potential to Achieve Goals: Good  OT Frequency: Min 2X/week   Barriers to D/C:            Co-evaluation              AM-PAC PT "6 Clicks" Daily Activity     Outcome Measure Help from another person eating meals?: None Help from another person taking care of personal grooming?: A Little Help from another person toileting, which includes using toliet, bedpan, or urinal?: A Little Help from another person bathing (including washing, rinsing, drying)?: A Little Help from another person to put on and taking off regular upper body clothing?: None Help from another person to put on and taking off regular lower body clothing?: A Little 6 Click Score: 20   End of Session Nurse Communication: Mobility status  Activity Tolerance: Patient tolerated treatment well Patient left:  (ambulating in room; signed off as independent)  OT Visit Diagnosis: Other symptoms and signs  involving cognitive function                Time: 9458-5929 OT Time Calculation (min): 18 min Charges:  OT General Charges $OT Visit: 1 Procedure OT Evaluation $OT Eval Moderate Complexity: 1 Procedure G-Codes:     Kevin Herrlich, MS OTR/L  Pager: [name] Kevin Bass 09/13/2016, 2:17 PM

## 2016-09-13 NOTE — Evaluation (Signed)
Speech Language Pathology Evaluation Patient Details Name: Kevin Bass MRN: [id number] DOB: Sep 22, 1940 Today's Date: 09/13/2016 Time:  -     Problem List:  Patient Active Problem List   Diagnosis Date Noted  . Slurred speech 09/12/2016  . Acute cerebral infarction ([place]) 09/12/2016  . Stroke ([place]) 09/12/2016  . GERD (gastroesophageal reflux disease) 09/22/2015  . PCP NOTES >>>>> 11/08/2014  . Chest pain 06/07/2014  . Facial neuropathy 04/02/2013  . Anxiety 02/26/2013  . Annual physical exam 08/30/2012  . Breath shortness 03/22/2011  . HTN (hypertension) 01/22/2011  . SHOULDER PAIN 04/21/2010  . HYPERTROPHY PROSTATE W/O UR OBST & OTH LUTS 10/16/2008  . Hyperglycemia 10/16/2008  . HEMORRHOIDS, INTERNAL 09/30/2008  . EXTERNAL HEMORRHOIDS 09/12/2008  . Hyperlipidemia 06/15/2006  . DISORDER, MITRAL VALVE 06/15/2006  . ALLERGIC RHINITIS 06/15/2006  . COLON CANCER, HX OF 06/15/2006   Past Medical History:  Past Medical History:  Diagnosis Date  . Allergy   . Anxiety   . Arthritis    hands  . Colon cancer [facility])    s/p partial colectomy 1990, Cscope neg 7-09, next 2014  . DISORDER, MITRAL VALVE    MV recontruction at [place] 2006----still needs ABX for SBE prophylaxis   . Elevated PSA    [name]  . GERD (gastroesophageal reflux disease)   . Heart murmur    no problems since surgery  . HYPERGLYCEMIA, BORDERLINE 10/16/2008  . HYPERLIPIDEMIA 06/15/2006   diet controlled  . Hypertension   . HYPERTROPHY PROSTATE W/O UR OBST & OTH LUTS 10/16/2008  . Right groin pain    Dr. Redmond Pulling  . Wears partial dentures    lower partial   Past Surgical History:  Past Surgical History:  Procedure Laterality Date  . COLECTOMY  1990  . COLONOSCOPY    . HERNIA REPAIR Left 2014   [facility] rep w/mesh- March 2014  . MITRAL VALVE ANNULOPLASTY     [facility] 2006   HPI:  Pt is a 76 y.o. male with PMH significant hypertension, hyperlipidemia, GERD mitral valve reconstruction 2006, colon cancer status  post partial colectomy 1990 presented to the emergency Department on 7/22 chief complaint of slurred speech. Initial evaluation in the emergency department includes an MRI of the brain revealing acute infarct in the left posterior insula and parietal operculum. At bedside pt and wife explained speech wasn't necessarily slurred but that he was "having trouble finding his words". PT noted difficulty following commands as well. Cognitive-linguistic eval ordered as part of stroke workup.   Assessment / Plan / Recommendation Clinical Impression  Pt presenting with a mild expressive aphasia, characterized by frequent pauses/ use of filler words in connected speech, no paraphasias noted. Pt had some difficulty with generative naming task. Cognition, receptive language, and motor speech WNL for tasks assessed. Pt and wife feel that speech has improved significantly since admission but is not quite at baseline. Pt may benefit from outpatient speech/ language therapy to re-evaluate/ provide treatment for any remaining deficits, with an excellent prognosis. SLP will sign off at this time for acute needs; please re-consult if needs arise.    SLP Assessment  SLP Recommendation/Assessment: All further Speech Lanaguage Pathology  needs can be addressed in the next venue of care SLP Visit Diagnosis: Aphasia (R47.01)    Follow Up Recommendations  Outpatient SLP    Frequency and Duration           SLP Evaluation Cognition  Overall Cognitive Status: Within Functional Limits for tasks assessed Arousal/Alertness: Awake/alert Orientation  Level: Oriented X4 Attention: Alternating Alternating Attention: Appears intact Memory: Appears intact Awareness: Appears intact Safety/Judgment: Appears intact       Comprehension  Auditory Comprehension Overall Auditory Comprehension: Appears within functional limits for tasks assessed Yes/No Questions: Within Functional Limits Commands: Within Functional  Limits Conversation: Complex Visual Recognition/Discrimination Discrimination: Within Function Limits Reading Comprehension Reading Status: Within funtional limits    Expression Expression Primary Mode of Expression: Verbal Verbal Expression Overall Verbal Expression: Impaired Initiation: No impairment Level of Generative/Spontaneous Verbalization: Conversation Repetition: No impairment Naming: No impairment Pragmatics: No impairment Other Verbal Expression Comments:  (frequent pauses/ filler words, no paraphasias)   Oral / Motor  Oral Motor/Sensory Function Overall Oral Motor/Sensory Function: Within functional limits Motor Speech Overall Motor Speech: Appears within functional limits for tasks assessed   GO                    Kern Reap, [place], CCC-SLP 09/13/2016, 9:59 AM [phone]

## 2016-09-13 NOTE — Progress Notes (Signed)
[facility]                                                                              Patient Demographics  Kevin Bass, is a 76 y.o. male, DOB - Jan 01, 1941, OJJ:[id number]  Admit date - 09/12/2016   Admitting Physician Truett Mainland, DO  Outpatient Primary MD for the patient is Colon Branch, MD  Outpatient specialists:   LOS - 1  days   Medical records reviewed and are as summarized below:    Chief Complaint  Patient presents with  . Stroke Symptoms       Brief summary   The patient is a 76 year old male with hypertension, hyperlipidemia, GERD, mitral valve repair in 2006, colon CA status post partial colectomy in 1990 presented with slurred speech around 9 AM a day before the admission. Patient described it as difficulty getting his words out. MRI of the brain showed acute infarct in the left posterior insula and parietal operculum   Assessment & Plan    Principal Problem:   Acute cerebral infarction (HCC)With slurred speech, possible embolic phenomena - Speech improving - MRI of the brain showed acute infarct in the left posterior insula and parietal operculum - MRA showed no large or medium vessel pathological finding, no evidence of correctable left MCA stenosis - 2-D echo showed EF of 93-71%, grade 1 diastolic dysfunction mitral valve status post repair no significant stenosis or regurgitation - Carotid Dopplers 1-39% ICA stenosis bilaterally - LDL 124, on Zetia, allergic to statins - Hemoglobin A1c 5.6 -Placed on Plavix - Discussed with neurology, Dr. Leonie Man, recommended TEE and loop recorder in a.m.  - PT OT consult recommended outpatient PT SLP consult recommended outpatient SLP-   Active Problems:    HTN (hypertension) - Currently stable, permissive hypertension for 24-48 hours, hold metoprolol, HCTZ    Anxiety - Continue Xanax as needed     GERD (gastroesophageal reflux disease) - Continue PPI   Code Status: FC DVT  Prophylaxis:  Lovenox Family Communication: Discussed in detail with the patient, all imaging results, lab results explained to the patient and wife   Disposition Plan:   Time Spent in minutes   25 minutes  Procedures:  MRI, MRA brain 2-D echo  Consultants Neurology  Antimicrobials :      Medications  Scheduled Meds: . clopidogrel  75 mg Oral Daily  . enoxaparin (LOVENOX) injection  40 mg Subcutaneous Q24H  . ezetimibe  10 mg Oral QHS  . gabapentin  300 mg Oral BID  . pantoprazole  40 mg Oral Daily   Continuous Infusions: PRN Meds:.acetaminophen **OR** acetaminophen (TYLENOL) oral liquid 160 mg/5 mL **OR** acetaminophen, ALPRAZolam, senna-docusate   Antibiotics   Anti-infectives    None        Subjective:   Kevin Bass was seen and examined today.  Speech improving, ambulating in the room. Overall feels he is getting better. Patient denies dizziness, chest pain, shortness of breath, abdominal pain, N/V/D/C, new weakness, numbess, tingling. No acute events overnight.    Objective:   Vitals:   09/12/16 2330 09/13/16 0130 09/13/16 0330 09/13/16 0938  BP: 107/61 124/69  102/62 128/85  Pulse: 64 67 67 79  Resp: 16 16 16 16   Temp: (!) 97.5 F (36.4 C) 97.8 F (36.6 C) 97.7 F (36.5 C) 97.9 F (36.6 C)  TempSrc: Oral Oral Oral Oral  SpO2: 96% 95% 98% 100%  Weight:      Height:        Intake/Output Summary (Last 24 hours) at 09/13/16 1255 Last data filed at 09/13/16 0901  Gross per 24 hour  Intake              365 ml  Output                0 ml  Net              365 ml     Wt Readings from Last 3 Encounters:  09/12/16 79.4 kg (175 lb)  09/12/16 74.4 kg (164 lb)  09/12/16 74.4 kg (164 lb)     Exam  General: Alert and oriented x 3, NAD, Mild dysarthria   Eyes: PERRLA, EOMI, Anicteric Sclera,  HEENT:  Atraumatic, normocephalic, normal oropharynx  Cardiovascular: S1 S2 auscultated, no rubs, murmurs or gallops. Regular rate and  rhythm.  Respiratory: Clear to auscultation bilaterally, no wheezing, rales or rhonchi  Gastrointestinal: Soft, nontender, nondistended, + bowel sounds  Ext: no pedal edema bilaterally  Neuro: AAOx3, Cr N's II- XII. Strength 5/5 upper and lower extremities bilaterally  Musculoskeletal: No digital cyanosis, clubbing  Skin: No rashes  Psych: Normal affect and demeanor, alert and oriented x3    Data Reviewed:  I have personally reviewed following labs and imaging studies  Micro Results No results found for this or any previous visit (from the past 240 hour(s)).  Radiology Reports Dg Chest 2 View  Result Date: 09/12/2016 CLINICAL DATA:  Stroke EXAM: CHEST  2 VIEW COMPARISON:  CTA chest dated 06/07/2014 FINDINGS: Lungs are clear.  No pleural effusion or pneumothorax. Heart is normal in size.  Prosthetic mitral valve. Median sternotomy. Visualized osseous structures are within normal limits. IMPRESSION: Normal chest radiographs. Electronically Signed   By: Julian Hy M.D.   On: 09/12/2016 14:52   Mr Brain Wo Contrast  Result Date: 09/12/2016 CLINICAL DATA:  Dysarthria.  Slurred speech EXAM: MRI HEAD WITHOUT CONTRAST TECHNIQUE: Multiplanar, multiecho pulse sequences of the brain and surrounding structures were obtained without intravenous contrast. COMPARISON:  None. FINDINGS: Brain: Acute infarct in the left posterior insula and parietal operculum. No other acute infarct. No significant chronic ischemia. Ventricle size normal. Negative for hemorrhage or mass. Vascular: Normal arterial flow void Skull and upper cervical spine: Negative Sinuses/Orbits: Mild mucosal edema paranasal sinuses.  Normal orbit Other: None IMPRESSION: Acute infarct left posterior insula and parietal operculum. Otherwise negative Electronically Signed   By: Franchot Gallo M.D.   On: 09/12/2016 13:41   Mr Jodene Nam Head/brain GY Cm  Result Date: 09/12/2016 CLINICAL DATA:  Followup acute left brain infarction. EXAM:  MRA HEAD WITHOUT CONTRAST TECHNIQUE: Angiographic images of the Circle of Willis were obtained using MRA technique without intravenous contrast. COMPARISON:  09/12/2016 FINDINGS: Both internal carotid arteries are widely patent through the skullbase and siphon regions. The anterior and middle cerebral vessels are patent bilaterally without proximal stenosis, aneurysm or vascular malformation. No missing branch vessels are detected, with specific attention to the region of infarction on the left. Incidental azygos anterior cerebral artery. The right vertebral artery supplies PICA and gives a minimal contribution to the basilar. The left vertebral artery is dominant, widely  patent to the basilar. No basilar stenosis. Posterior circulation branch vessels are normal. IMPRESSION: No large or medium vessel pathologic finding. No evidence of correctable left MCA stenosis. No discernible missing branch vessel on the left. Electronically Signed   By: Nelson Chimes M.D.   On: 09/12/2016 15:00    Lab Data:  CBC:  Recent Labs Lab 09/12/16 1125  WBC 6.2  NEUTROABS 4.3  HGB 15.0  HCT 44.2  MCV 84.4  PLT 888   Basic Metabolic Panel:  Recent Labs Lab 09/12/16 1125  NA 138  K 3.8  CL 100*  CO2 30  GLUCOSE 106*  BUN 15  CREATININE 1.04  CALCIUM 9.4   GFR: Estimated Creatinine Clearance: 60.4 mL/min (by C-G formula based on SCr of 1.04 mg/dL). Liver Function Tests:  Recent Labs Lab 09/12/16 1125  AST 21  ALT 15*  ALKPHOS 41  BILITOT 1.1  PROT 7.0  ALBUMIN 4.1   No results for input(s): LIPASE, AMYLASE in the last 168 hours. No results for input(s): AMMONIA in the last 168 hours. Coagulation Profile:  Recent Labs Lab 09/12/16 1125  INR 1.08   Cardiac Enzymes: No results for input(s): CKTOTAL, CKMB, CKMBINDEX, TROPONINI in the last 168 hours. BNP (last 3 results) No results for input(s): PROBNP in the last 8760 hours. HbA1C:  Recent Labs  09/12/16 1430  HGBA1C 5.6    CBG: No results for input(s): GLUCAP in the last 168 hours. Lipid Profile:  Recent Labs  09/12/16 1430  CHOL 185  HDL 42  LDLCALC 124*  TRIG 94  CHOLHDL 4.4   Thyroid Function Tests: No results for input(s): TSH, T4TOTAL, FREET4, T3FREE, THYROIDAB in the last 72 hours. Anemia Panel: No results for input(s): VITAMINB12, FOLATE, FERRITIN, TIBC, IRON, RETICCTPCT in the last 72 hours. Urine analysis:    Component Value Date/Time   COLORURINE YELLOW 09/13/2016 0222   APPEARANCEUR CLEAR 09/13/2016 0222   LABSPEC 1.011 09/13/2016 0222   PHURINE 6.0 09/13/2016 0222   GLUCOSEU NEGATIVE 09/13/2016 0222   HGBUR NEGATIVE 09/13/2016 0222   BILIRUBINUR NEGATIVE 09/13/2016 0222   KETONESUR NEGATIVE 09/13/2016 0222   PROTEINUR NEGATIVE 09/13/2016 0222   NITRITE NEGATIVE 09/13/2016 0222   LEUKOCYTESUR NEGATIVE 09/13/2016 0222     Avice Funchess M.D. [facility] 09/13/2016, 12:55 PM  Pager: [phone] Between 7am to 7pm - call Pager - [phone]  After 7pm go to [email or web address] - password [email or web address]  Call night coverage person covering after 7pm

## 2016-09-14 ENCOUNTER — Encounter (HOSPITAL_COMMUNITY)
Admission: EM | Disposition: A | Discharge status: Home / Self Care | Payer: Self-pay | Source: Home / Self Care | Attending: Internal Medicine

## 2016-09-14 ENCOUNTER — Inpatient Hospital Stay (HOSPITAL_COMMUNITY): Payer: PPO

## 2016-09-14 ENCOUNTER — Encounter (HOSPITAL_COMMUNITY): Payer: Self-pay | Admitting: *Deleted

## 2016-09-14 DIAGNOSIS — I639 Cerebral infarction, unspecified: Secondary | ICD-10-CM

## 2016-09-14 SURGERY — INVASIVE LAB ABORTED CASE

## 2016-09-14 MED ORDER — CLOPIDOGREL BISULFATE 75 MG PO TABS: 75.0000 mg | ORAL_TABLET | Freq: Every day | ORAL | 4 refills | Status: DC

## 2016-09-14 MED ORDER — DIATRIZOATE MEGLUMINE & SODIUM 66-10 % PO SOLN
ORAL | Status: AC
  Administered 2016-09-14: 90 mL
  Filled 2016-09-14: qty 240

## 2016-09-14 MED ORDER — FENTANYL CITRATE (PF) 100 MCG/2ML IJ SOLN
INTRAMUSCULAR | Status: DC | PRN
  Administered 2016-09-14 (×2): 25 ug via INTRAVENOUS
  Administered 2016-09-14 (×2): 12.5 ug via INTRAVENOUS

## 2016-09-14 MED ORDER — BUTAMBEN-TETRACAINE-BENZOCAINE 2-2-14 % EX AERO
INHALATION_SPRAY | CUTANEOUS | Status: DC | PRN
  Administered 2016-09-14: 2 via TOPICAL

## 2016-09-14 MED ORDER — FENTANYL CITRATE (PF) 100 MCG/2ML IJ SOLN
INTRAMUSCULAR | Status: AC
  Filled 2016-09-14: qty 2

## 2016-09-14 MED ORDER — MIDAZOLAM HCL 5 MG/ML IJ SOLN
INTRAMUSCULAR | Status: AC
  Filled 2016-09-14: qty 2

## 2016-09-14 MED ORDER — MIDAZOLAM HCL 10 MG/2ML IJ SOLN
INTRAMUSCULAR | Status: DC | PRN
  Administered 2016-09-14: 1 mg via INTRAVENOUS
  Administered 2016-09-14: 2 mg via INTRAVENOUS
  Administered 2016-09-14 (×2): 1 mg via INTRAVENOUS

## 2016-09-14 MED ORDER — DIATRIZOATE MEGLUMINE & SODIUM 66-10 % PO SOLN
ORAL | Status: AC
  Filled 2016-09-14: qty 30

## 2016-09-14 MED ORDER — DIATRIZOATE MEGLUMINE & SODIUM 66-10 % PO SOLN
90.0000 mL | Freq: Once | ORAL | Status: DC
  Filled 2016-09-14: qty 90

## 2016-09-14 NOTE — Interval H&P Note (Signed)
History and Physical Interval Note:  09/14/2016 1:24 PM  Kevin Bass  has presented today for surgery, with the diagnosis of stroke  The various methods of treatment have been discussed with the patient and family. After consideration of risks, benefits and other options for treatment, the patient has consented to  Procedure(s): TRANSESOPHAGEAL ECHOCARDIOGRAM (TEE) WITH LOOP (N/A) as a surgical intervention .  The patient's history has been reviewed, patient examined, no change in status, stable for surgery.  I have reviewed the patient's chart and labs.  Questions were answered to the patient's satisfaction.     Fransico Him

## 2016-09-14 NOTE — Progress Notes (Addendum)
[facility]                                                                              Patient Demographics  Kevin Bass, is a 76 y.o. male, DOB - 1940-10-11, JME:[id number]  Admit date - 09/12/2016   Admitting Physician Truett Mainland, DO  Outpatient Primary MD for the patient is Colon Branch, MD  Outpatient specialists:   LOS - 2  days   Medical records reviewed and are as summarized below:    Chief Complaint  Patient presents with  . Stroke Symptoms       Brief summary   The patient is a 76 year old male with hypertension, hyperlipidemia, GERD, mitral valve repair in 2006, colon CA status post partial colectomy in 1990 presented with slurred speech around 9 AM a day before the admission. Patient described it as difficulty getting his words out. MRI of the brain showed acute infarct in the left posterior insula and parietal operculum   Assessment & Plan    Principal Problem:   Acute cerebral infarction (HCC)With slurred speech, possible embolic phenomena - Speech improving - MRI of the brain showed acute infarct in the left posterior insula and parietal operculum - MRA showed no large or medium vessel pathological finding, no evidence of correctable left MCA stenosis - 2-D echo showed EF of 22-97%, grade 1 diastolic dysfunction mitral valve status post repair no significant stenosis or regurgitation - Carotid Dopplers 1-39% ICA stenosis bilaterally - LDL 124, on Zetia, allergic to statins - Hemoglobin A1c 5.6 - Placed on Plavix - PT OT consult recommended outpatient PT,  SLP consult recommended outpatient SLP - TEE planned today, however received a call from Dr. Radford Pax that TEE was unsuccessful after multiple attempts and procedure was terminated. She ordered stat chest x-ray and recommended patient be observed overnight to rule out any esophageal perforation. Ordered gastrograffin swallow study to rule out perforation.   - Planned loop recorder  today  Addendum: DG esophagogram reviewed : no perforation  Active Problems:    HTN (hypertension) - Currently stable, permissive hypertension for 24-48 hours, hold metoprolol, HCTZ    Anxiety - Continue Xanax as needed     GERD (gastroesophageal reflux disease) - Continue PPI   Code Status: FC DVT Prophylaxis:  Lovenox Family Communication: Discussed in detail with the patient, all imaging results, lab results explained to the patient and wife   Disposition Plan:   Time Spent in minutes   25 minutes  Procedures:  MRI, MRA brain 2-D echo  Consultants Neurology  Antimicrobials :      Medications  Scheduled Meds: . [MAR Hold] clopidogrel  75 mg Oral Daily  . [MAR Hold] enoxaparin (LOVENOX) injection  40 mg Subcutaneous Q24H  . [MAR Hold] ezetimibe  10 mg Oral QHS  . [MAR Hold] gabapentin  300 mg Oral BID  . [MAR Hold] pantoprazole  40 mg Oral Daily   Continuous Infusions: PRN Meds:.[MAR Hold] acetaminophen **OR** [MAR Hold] acetaminophen (TYLENOL) oral liquid 160 mg/5 mL **OR** [MAR Hold] acetaminophen, [MAR Hold] ALPRAZolam, [MAR Hold] senna-docusate   Antibiotics   Anti-infectives    None  Subjective:   Kevin Bass was seen and examined today. No complaints today, patient was seen prior to TEE, earlier this morning.  Patient denies dizziness, chest pain, shortness of breath, abdominal pain, N/V/D/C, new weakness, numbess, tingling. No acute events overnight.    Objective:   Vitals:   09/14/16 1525 09/14/16 1530 09/14/16 1535 09/14/16 1540  BP: (!) 149/105 132/71 (!) 121/59 134/71  Pulse: 84 75 72 81  Resp: 15 19 15 17   Temp:      TempSrc:      SpO2: 99% 98% 99% 99%  Weight:      Height:        Intake/Output Summary (Last 24 hours) at 09/14/16 1604 Last data filed at 09/14/16 0000  Gross per 24 hour  Intake              480 ml  Output                0 ml  Net              480 ml     Wt Readings from Last 3 Encounters:    09/12/16 79.4 kg (175 lb)  09/12/16 74.4 kg (164 lb)  09/12/16 74.4 kg (164 lb)     Exam Physical Exam  General: Alert and oriented x 3, NAD  Eyes:   HEENT:    Cardiovascular: S1 S2 clear, RRR. No pedal edema b/l  Respiratory: CTAB, no wheezing, rales or rhonchi  Gastrointestinal: Soft, nontender, nondistended, + bowel sounds  Ext: no pedal edema bilaterally  Neuro: AAOx3, Cr N's II- XII. Strength 5/5 upper and lower extremities bilaterally  Musculoskeletal: No digital cyanosis, clubbing  Skin: No rashes  Psych: Normal affect and demeanor, alert and oriented x3    Data Reviewed:  I have personally reviewed following labs and imaging studies  Micro Results No results found for this or any previous visit (from the past 240 hour(s)).  Radiology Reports Dg Chest 2 View  Result Date: 09/12/2016 CLINICAL DATA:  Stroke EXAM: CHEST  2 VIEW COMPARISON:  CTA chest dated 06/07/2014 FINDINGS: Lungs are clear.  No pleural effusion or pneumothorax. Heart is normal in size.  Prosthetic mitral valve. Median sternotomy. Visualized osseous structures are within normal limits. IMPRESSION: Normal chest radiographs. Electronically Signed   By: Julian Hy M.D.   On: 09/12/2016 14:52   Mr Brain Wo Contrast  Result Date: 09/12/2016 CLINICAL DATA:  Dysarthria.  Slurred speech EXAM: MRI HEAD WITHOUT CONTRAST TECHNIQUE: Multiplanar, multiecho pulse sequences of the brain and surrounding structures were obtained without intravenous contrast. COMPARISON:  None. FINDINGS: Brain: Acute infarct in the left posterior insula and parietal operculum. No other acute infarct. No significant chronic ischemia. Ventricle size normal. Negative for hemorrhage or mass. Vascular: Normal arterial flow void Skull and upper cervical spine: Negative Sinuses/Orbits: Mild mucosal edema paranasal sinuses.  Normal orbit Other: None IMPRESSION: Acute infarct left posterior insula and parietal operculum. Otherwise  negative Electronically Signed   By: Franchot Gallo M.D.   On: 09/12/2016 13:41   Mr Jodene Nam Head/brain JG Cm  Result Date: 09/12/2016 CLINICAL DATA:  Followup acute left brain infarction. EXAM: MRA HEAD WITHOUT CONTRAST TECHNIQUE: Angiographic images of the Circle of Willis were obtained using MRA technique without intravenous contrast. COMPARISON:  09/12/2016 FINDINGS: Both internal carotid arteries are widely patent through the skullbase and siphon regions. The anterior and middle cerebral vessels are patent bilaterally without proximal stenosis, aneurysm or vascular malformation. No missing branch vessels  are detected, with specific attention to the region of infarction on the left. Incidental azygos anterior cerebral artery. The right vertebral artery supplies PICA and gives a minimal contribution to the basilar. The left vertebral artery is dominant, widely patent to the basilar. No basilar stenosis. Posterior circulation branch vessels are normal. IMPRESSION: No large or medium vessel pathologic finding. No evidence of correctable left MCA stenosis. No discernible missing branch vessel on the left. Electronically Signed   By: Nelson Chimes M.D.   On: 09/12/2016 15:00    Lab Data:  CBC:  Recent Labs Lab 09/12/16 1125  WBC 6.2  NEUTROABS 4.3  HGB 15.0  HCT 44.2  MCV 84.4  PLT 517   Basic Metabolic Panel:  Recent Labs Lab 09/12/16 1125  NA 138  K 3.8  CL 100*  CO2 30  GLUCOSE 106*  BUN 15  CREATININE 1.04  CALCIUM 9.4   GFR: Estimated Creatinine Clearance: 60.4 mL/min (by C-G formula based on SCr of 1.04 mg/dL). Liver Function Tests:  Recent Labs Lab 09/12/16 1125  AST 21  ALT 15*  ALKPHOS 41  BILITOT 1.1  PROT 7.0  ALBUMIN 4.1   No results for input(s): LIPASE, AMYLASE in the last 168 hours. No results for input(s): AMMONIA in the last 168 hours. Coagulation Profile:  Recent Labs Lab 09/12/16 1125  INR 1.08   Cardiac Enzymes: No results for input(s):  CKTOTAL, CKMB, CKMBINDEX, TROPONINI in the last 168 hours. BNP (last 3 results) No results for input(s): PROBNP in the last 8760 hours. HbA1C:  Recent Labs  09/12/16 1430  HGBA1C 5.6   CBG: No results for input(s): GLUCAP in the last 168 hours. Lipid Profile:  Recent Labs  09/12/16 1430  CHOL 185  HDL 42  LDLCALC 124*  TRIG 94  CHOLHDL 4.4   Thyroid Function Tests: No results for input(s): TSH, T4TOTAL, FREET4, T3FREE, THYROIDAB in the last 72 hours. Anemia Panel: No results for input(s): VITAMINB12, FOLATE, FERRITIN, TIBC, IRON, RETICCTPCT in the last 72 hours. Urine analysis:    Component Value Date/Time   COLORURINE YELLOW 09/13/2016 0222   APPEARANCEUR CLEAR 09/13/2016 0222   LABSPEC 1.011 09/13/2016 0222   PHURINE 6.0 09/13/2016 0222   GLUCOSEU NEGATIVE 09/13/2016 0222   HGBUR NEGATIVE 09/13/2016 0222   BILIRUBINUR NEGATIVE 09/13/2016 0222   KETONESUR NEGATIVE 09/13/2016 0222   PROTEINUR NEGATIVE 09/13/2016 0222   NITRITE NEGATIVE 09/13/2016 0222   LEUKOCYTESUR NEGATIVE 09/13/2016 0222     Ripudeep Rai M.D. [facility] 09/14/2016, 4:04 PM  Pager: [phone] Between 7am to 7pm - call Pager - [phone]  After 7pm go to [email or web address] - password [email or web address]  Call night coverage person covering after 7pm

## 2016-09-14 NOTE — Consult Note (Signed)
[facility] CM Primary Care Navigator  09/14/2016  Kevin Bass 18-Jul-1940 [id number]   Met with patient and wife Kevin Bass) at the bedside to identify possible discharge needs.  Patient reports having "garbled speech" and "non-sensible statements" thathad led to this admission.  Patient confirms that primary care provider is Dr.Jose Bass with [facility] at [facility]. Patient states using [facility] [address] [facility] [address] in [place] to obtain medications without any problem so far.  Patient reports managing his medications at home straight out of the containers with his own organizing system (checklist).   Patient states that he drives prior to admission, however, wife will be providing transportation to his doctor's appointments if needed after discharge.  Patient had been independent with self care prior to admission. His wife will be his primary caregiver at home as stated.   Anticipated discharge plan is home with possible outpatient therapy according to patient.  Patient and wife expressed understanding to call primary care provider's office when he returns home, for a post discharge follow-up appointment within a week or sooner if needed. Patient letter (with PCP's contact number) was provided as a reminder.  Explained to patient about [facility] CM services available for health management at home.  He verbally agreed to [facility] strokecalls for follow-up as he recovers.  Noted that order was already infor [facility] stroke calls after discharge.  [facility] care management information provided for future needs that may arise.   For questions, please contact:  Kevin Bass, BSN, RN- [facility] Primary Care Navigator  Telephone: [phone] [place]

## 2016-09-14 NOTE — Progress Notes (Signed)
PT Cancellation Note  Patient Details Name: Kevin Bass MRN: [id number] DOB: 11/30/1940   Cancelled Treatment:    Reason Eval/Treat Not Completed: Patient at procedure or test/unavailable   Duncan Dull 09/14/2016, 1:59 PM Alben Deeds, PT DPT  Board Certified Neurologic Specialist [phone]

## 2016-09-14 NOTE — Progress Notes (Signed)
OT Cancellation Note  Patient Details Name: Kevin Bass MRN: [id number] DOB: [birth year]   Cancelled Treatment:    Reason Eval/Treat Not Completed: Patient at procedure or test/ unavailable. Pt off unit for procedure. Will check back as able.  [name], MS OTR/L  Pager: [phone]   [name] 09/14/2016, 2:47 PM

## 2016-09-14 NOTE — Consult Note (Signed)
ELECTROPHYSIOLOGY CONSULT NOTE  Patient ID: Kevin Bass MRN: [id number], DOB/AGE: 78/02/1940   Admit date: 09/12/2016 Date of Consult: 09/14/2016  Primary Physician: Colon Branch, MD Primary Cardiologist: Alcide Evener Reason for Consultation: Cryptogenic stroke; recommendations regarding Implantable Loop Recorder  History of Present Illness Kevin Bass is a 76 y.o. male whom EP has been asked to consult by Dr Leonie Man for evaluation of ILR implant in the setting of cryptogenic stroke.  He was admitted on 09/12/2016 with expressive aphasia. Imaging demonstrated acute infarct left posterior insula and parietal operculum felt to be embolic 2/2 unknown source.  He has undergone workup for stroke including echocardiogram and carotid dopplers.  The patient has been monitored on telemetry which has demonstrated sinus rhythm with no arrhythmias.  Inpatient stroke work-up is to be completed with a TEE.   Echocardiogram this admission demonstrated EF 41-66%, grade 1 diastolic dysfunction, s/p MV repair, LA 42.  Lab work is reviewed.  Prior to admission, the patient denies chest pain, shortness of breath, dizziness, palpitations, or syncope.  They are recovering from their stroke with plans to return home at discharge.  EP has been asked to evaluate for placement of an implantable loop recorder to monitor for atrial fibrillation.  Past Medical History:  Diagnosis Date  . Allergy   . Anxiety   . Arthritis    hands  . Colon cancer [facility])    s/p partial colectomy 1990, Cscope neg 7-09, next 2014  . DISORDER, MITRAL VALVE    MV recontruction at [place] 2006----still needs ABX for SBE prophylaxis   . Elevated PSA    [name]  . GERD (gastroesophageal reflux disease)   . Heart murmur    no problems since surgery  . HYPERGLYCEMIA, BORDERLINE 10/16/2008  . HYPERLIPIDEMIA 06/15/2006   diet controlled  . Hypertension   . HYPERTROPHY PROSTATE W/O UR OBST & OTH LUTS 10/16/2008  . Right groin  pain    Dr. Redmond Pulling  . Wears partial dentures    lower partial     Surgical History:  Past Surgical History:  Procedure Laterality Date  . COLECTOMY  1990  . COLONOSCOPY    . HERNIA REPAIR Left 2014   [facility] rep w/mesh- March 2014  . MITRAL VALVE ANNULOPLASTY     [facility] 2006     Facility-Administered Medications Prior to Admission  Medication Dose Route Frequency Provider Last Rate Last Dose  . 0.9 %  sodium chloride infusion  500 mL Intravenous Continuous Armbruster, Renelda Loma, MD       Prescriptions Prior to Admission  Medication Sig Dispense Refill Last Dose  . ALPRAZolam (XANAX) 0.25 MG tablet Take 1-2 tablets (0.25-0.5 mg total) by mouth 2 (two) times daily as needed for anxiety. 40 tablet 1 09/12/2016 at Unknown time  . ARGININE PO Take 3 tablets by mouth daily.    09/12/2016 at Unknown time  . aspirin 81 MG tablet Take 81 mg by mouth at bedtime.    09/11/2016 at 2300  . ezetimibe (ZETIA) 10 MG tablet Take 1 tablet (10 mg total) by mouth daily. (Patient taking differently: Take 10 mg by mouth at bedtime. ) 90 tablet 1 09/11/2016 at Unknown time  . gabapentin (NEURONTIN) 300 MG capsule Take 1 capsule (300 mg total) by mouth 2 (two) times daily. 60 capsule 6 09/11/2016 at HS  . hydrochlorothiazide (HYDRODIURIL) 25 MG tablet Take 25 mg by mouth at bedtime.   09/11/2016 at Unknown time  . loratadine (CLARITIN) 10 MG  tablet Take 10 mg by mouth daily as needed for allergies.   Past Week at Unknown time  . metoprolol tartrate (LOPRESSOR) 25 MG tablet Take 1 tablet (25 mg total) by mouth 2 (two) times daily. 180 tablet 3 09/12/2016 at 1000  . Misc Natural Products (PROSTATE HEALTH PO) Take 1 tablet by mouth daily.   09/11/2016 at Unknown time  . Multiple Vitamins-Minerals (OCUVITE PRESERVISION PO) Take 1 tablet by mouth 2 (two) times daily.    09/11/2016 at Unknown time  . Omega-3 Fatty Acids (FISH OIL PO) Take 1 capsule by mouth daily.    09/11/2016 at Unknown time  . omeprazole (PRILOSEC) 40 MG  capsule Take 1 capsule (40 mg total) by mouth daily. (Patient taking differently: Take 40 mg by mouth daily as needed (for acid reflux). ) 90 capsule 3 09/11/2016 at Unknown time  . Red Yeast Rice Extract (RED YEAST RICE PO) Take 2 tablets by mouth daily.    09/11/2016 at Unknown time    Inpatient Medications:  . clopidogrel  75 mg Oral Daily  . enoxaparin (LOVENOX) injection  40 mg Subcutaneous Q24H  . ezetimibe  10 mg Oral QHS  . gabapentin  300 mg Oral BID  . pantoprazole  40 mg Oral Daily    Allergies:  Allergies  Allergen Reactions  . Statins Other (See Comments)    Other reaction(s): Myalgias (intolerance)    Social History   Social History  . Marital status: Married    Spouse name: suzanne  . Number of children: 2  . Years of education: college   Occupational History  . retired     Social History Main Topics  . Smoking status: Former Smoker    Packs/day: 0.50    Years: 20.00    Types: Cigarettes    Quit date: 02/23/1983  . Smokeless tobacco: Never Used  . Alcohol use 4.2 oz/week    7 Glasses of wine per week  . Drug use: No  . Sexual activity: Not on file   Other Topics Concern  . Not on file   Social History Narrative   From [place], household pt and wife   Daughter in [place], son in [place]           Family History  Problem Relation Age of Onset  . Hypertension Mother   . Hypertension Sister   . Hypertension Brother   . Colon cancer Other        M side (cousins)  . Diabetes Neg Hx   . Stroke Neg Hx   . CAD Neg Hx   . Prostate cancer Neg Hx   . Rectal cancer Neg Hx   . Esophageal cancer Neg Hx       Review of Systems: All other systems reviewed and are otherwise negative except as noted above.  Physical Exam: Vitals:   09/13/16 1834 09/13/16 2128 09/14/16 0133 09/14/16 0532  BP: 116/64 (!) 142/80 (!) 119/58 (!) 112/59  Pulse: 78 79 71 77  Resp: 16 16 18 18   Temp: 98 F (36.7 C) 97.9 F (36.6 C) 97.8 F (36.6 C) 98.1 F (36.7 C)  TempSrc:  Oral Oral Oral Oral  SpO2: 97% 99% 97% 98%  Weight:      Height:        GEN- The patient is well appearing, alert and oriented x 3 today.   Head- normocephalic, atraumatic Eyes-  Sclera clear, conjunctiva pink Ears- hearing intact Oropharynx- clear Neck- supple Lungs- Clear to ausculation bilaterally,  normal work of breathing Heart- Regular rate and rhythm, no murmurs, rubs or gallops  GI- soft, NT, ND, + BS Extremities- no clubbing, cyanosis, or edema MS- no significant deformity or atrophy Skin- no rash or lesion Psych- euthymic mood, full affect   Labs:   Lab Results  Component Value Date   WBC 6.2 09/12/2016   HGB 15.0 09/12/2016   HCT 44.2 09/12/2016   MCV 84.4 09/12/2016   PLT 204 09/12/2016    Recent Labs Lab 09/12/16 1125  NA 138  K 3.8  CL 100*  CO2 30  BUN 15  CREATININE 1.04  CALCIUM 9.4  PROT 7.0  BILITOT 1.1  ALKPHOS 41  ALT 15*  AST 21  GLUCOSE 106*    Radiology/Studies: Dg Chest 2 View  Result Date: 09/12/2016 CLINICAL DATA:  Stroke EXAM: CHEST  2 VIEW COMPARISON:  CTA chest dated 06/07/2014 FINDINGS: Lungs are clear.  No pleural effusion or pneumothorax. Heart is normal in size.  Prosthetic mitral valve. Median sternotomy. Visualized osseous structures are within normal limits. IMPRESSION: Normal chest radiographs. Electronically Signed   By: Julian Hy M.D.   On: 09/12/2016 14:52   Mr Brain Wo Contrast  Result Date: 09/12/2016 CLINICAL DATA:  Dysarthria.  Slurred speech EXAM: MRI HEAD WITHOUT CONTRAST TECHNIQUE: Multiplanar, multiecho pulse sequences of the brain and surrounding structures were obtained without intravenous contrast. COMPARISON:  None. FINDINGS: Brain: Acute infarct in the left posterior insula and parietal operculum. No other acute infarct. No significant chronic ischemia. Ventricle size normal. Negative for hemorrhage or mass. Vascular: Normal arterial flow void Skull and upper cervical spine: Negative Sinuses/Orbits:  Mild mucosal edema paranasal sinuses.  Normal orbit Other: None IMPRESSION: Acute infarct left posterior insula and parietal operculum. Otherwise negative Electronically Signed   By: Franchot Gallo M.D.   On: 09/12/2016 13:41   Mr Jodene Nam Head/brain RJ Cm  Result Date: 09/12/2016 CLINICAL DATA:  Followup acute left brain infarction. EXAM: MRA HEAD WITHOUT CONTRAST TECHNIQUE: Angiographic images of the Circle of Willis were obtained using MRA technique without intravenous contrast. COMPARISON:  09/12/2016 FINDINGS: Both internal carotid arteries are widely patent through the skullbase and siphon regions. The anterior and middle cerebral vessels are patent bilaterally without proximal stenosis, aneurysm or vascular malformation. No missing branch vessels are detected, with specific attention to the region of infarction on the left. Incidental azygos anterior cerebral artery. The right vertebral artery supplies PICA and gives a minimal contribution to the basilar. The left vertebral artery is dominant, widely patent to the basilar. No basilar stenosis. Posterior circulation branch vessels are normal. IMPRESSION: No large or medium vessel pathologic finding. No evidence of correctable left MCA stenosis. No discernible missing branch vessel on the left. Electronically Signed   By: Nelson Chimes M.D.   On: 09/12/2016 15:00    12-lead ECG sinus rhythm, rate 74 All prior EKG's in EPIC reviewed with no documented atrial fibrillation  Telemetry sinus rhythm  Assessment and Plan:  1. Cryptogenic stroke The patient presents with cryptogenic stroke.  The patient has a TEE planned for later today.  I spoke at length with the patient about monitoring for afib with an implantable loop recorder.  Risks, benefits, and alteratives to implantable loop recorder were discussed with the patient today.   At this time, the patient is very clear in their decision to proceed with implantable loop recorder.   Wound care was  reviewed with the patient (keep incision clean and dry for 3 days).  Please call with questions.   Chanetta Marshall, NP 09/14/2016 9:26 AM   I have seen, examined the patient, and reviewed the above assessment and plan.  On exam, expressive aphasia.  Changes to above are made where necessary.  Pt with no known h/o afib.  Given prior mitral valve disease, I worry about atrial fibrillation as a possible cause.  Telemetry here has revealed no arrhythmias.  Risks and benefits to ILR implant discussed with the patient who wishes to proceed if TEE is unrevealing.  Co Sign: Thompson Grayer, MD 09/14/2016 12:43 PM

## 2016-09-14 NOTE — H&P (View-Only) (Signed)
ELECTROPHYSIOLOGY CONSULT NOTE  Patient ID: Kevin Bass MRN: [id number], DOB/AGE: 04/22/1940   Admit date: 09/12/2016 Date of Consult: 09/14/2016  Primary Physician: Colon Branch, MD Primary Cardiologist: Alcide Evener Reason for Consultation: Cryptogenic stroke; recommendations regarding Implantable Loop Recorder  History of Present Illness Kevin Bass is a 76 y.o. male whom EP has been asked to consult by Dr Leonie Man for evaluation of ILR implant in the setting of cryptogenic stroke.  He was admitted on 09/12/2016 with expressive aphasia. Imaging demonstrated acute infarct left posterior insula and parietal operculum felt to be embolic 2/2 unknown source.  He has undergone workup for stroke including echocardiogram and carotid dopplers.  The patient has been monitored on telemetry which has demonstrated sinus rhythm with no arrhythmias.  Inpatient stroke work-up is to be completed with a TEE.   Echocardiogram this admission demonstrated EF 83-41%, grade 1 diastolic dysfunction, s/p MV repair, LA 42.  Lab work is reviewed.  Prior to admission, the patient denies chest pain, shortness of breath, dizziness, palpitations, or syncope.  They are recovering from their stroke with plans to return home at discharge.  EP has been asked to evaluate for placement of an implantable loop recorder to monitor for atrial fibrillation.  Past Medical History:  Diagnosis Date  . Allergy   . Anxiety   . Arthritis    hands  . Colon cancer [facility])    s/p partial colectomy 1990, Cscope neg 7-09, next 2014  . DISORDER, MITRAL VALVE    MV recontruction at [place] 2006----still needs ABX for SBE prophylaxis   . Elevated PSA    [name]  . GERD (gastroesophageal reflux disease)   . Heart murmur    no problems since surgery  . HYPERGLYCEMIA, BORDERLINE 10/16/2008  . HYPERLIPIDEMIA 06/15/2006   diet controlled  . Hypertension   . HYPERTROPHY PROSTATE W/O UR OBST & OTH LUTS 10/16/2008  . Right groin  pain    Dr. Redmond Pulling  . Wears partial dentures    lower partial     Surgical History:  Past Surgical History:  Procedure Laterality Date  . COLECTOMY  1990  . COLONOSCOPY    . HERNIA REPAIR Left 2014   [facility] rep w/mesh- March 2014  . MITRAL VALVE ANNULOPLASTY     [facility] 2006     Facility-Administered Medications Prior to Admission  Medication Dose Route Frequency Provider Last Rate Last Dose  . 0.9 %  sodium chloride infusion  500 mL Intravenous Continuous Armbruster, Renelda Loma, MD       Prescriptions Prior to Admission  Medication Sig Dispense Refill Last Dose  . ALPRAZolam (XANAX) 0.25 MG tablet Take 1-2 tablets (0.25-0.5 mg total) by mouth 2 (two) times daily as needed for anxiety. 40 tablet 1 09/12/2016 at Unknown time  . ARGININE PO Take 3 tablets by mouth daily.    09/12/2016 at Unknown time  . aspirin 81 MG tablet Take 81 mg by mouth at bedtime.    09/11/2016 at 2300  . ezetimibe (ZETIA) 10 MG tablet Take 1 tablet (10 mg total) by mouth daily. (Patient taking differently: Take 10 mg by mouth at bedtime. ) 90 tablet 1 09/11/2016 at Unknown time  . gabapentin (NEURONTIN) 300 MG capsule Take 1 capsule (300 mg total) by mouth 2 (two) times daily. 60 capsule 6 09/11/2016 at HS  . hydrochlorothiazide (HYDRODIURIL) 25 MG tablet Take 25 mg by mouth at bedtime.   09/11/2016 at Unknown time  . loratadine (CLARITIN) 10 MG  tablet Take 10 mg by mouth daily as needed for allergies.   Past Week at Unknown time  . metoprolol tartrate (LOPRESSOR) 25 MG tablet Take 1 tablet (25 mg total) by mouth 2 (two) times daily. 180 tablet 3 09/12/2016 at 1000  . Misc Natural Products (PROSTATE HEALTH PO) Take 1 tablet by mouth daily.   09/11/2016 at Unknown time  . Multiple Vitamins-Minerals (OCUVITE PRESERVISION PO) Take 1 tablet by mouth 2 (two) times daily.    09/11/2016 at Unknown time  . Omega-3 Fatty Acids (FISH OIL PO) Take 1 capsule by mouth daily.    09/11/2016 at Unknown time  . omeprazole (PRILOSEC) 40 MG  capsule Take 1 capsule (40 mg total) by mouth daily. (Patient taking differently: Take 40 mg by mouth daily as needed (for acid reflux). ) 90 capsule 3 09/11/2016 at Unknown time  . Red Yeast Rice Extract (RED YEAST RICE PO) Take 2 tablets by mouth daily.    09/11/2016 at Unknown time    Inpatient Medications:  . clopidogrel  75 mg Oral Daily  . enoxaparin (LOVENOX) injection  40 mg Subcutaneous Q24H  . ezetimibe  10 mg Oral QHS  . gabapentin  300 mg Oral BID  . pantoprazole  40 mg Oral Daily    Allergies:  Allergies  Allergen Reactions  . Statins Other (See Comments)    Other reaction(s): Myalgias (intolerance)    Social History   Social History  . Marital status: Married    Spouse name: suzanne  . Number of children: 2  . Years of education: college   Occupational History  . retired     Social History Main Topics  . Smoking status: Former Smoker    Packs/day: 0.50    Years: 20.00    Types: Cigarettes    Quit date: 02/23/1983  . Smokeless tobacco: Never Used  . Alcohol use 4.2 oz/week    7 Glasses of wine per week  . Drug use: No  . Sexual activity: Not on file   Other Topics Concern  . Not on file   Social History Narrative   From [place], household pt and wife   Daughter in [place], son in [place]           Family History  Problem Relation Age of Onset  . Hypertension Mother   . Hypertension Sister   . Hypertension Brother   . Colon cancer Other        M side (cousins)  . Diabetes Neg Hx   . Stroke Neg Hx   . CAD Neg Hx   . Prostate cancer Neg Hx   . Rectal cancer Neg Hx   . Esophageal cancer Neg Hx       Review of Systems: All other systems reviewed and are otherwise negative except as noted above.  Physical Exam: Vitals:   09/13/16 1834 09/13/16 2128 09/14/16 0133 09/14/16 0532  BP: 116/64 (!) 142/80 (!) 119/58 (!) 112/59  Pulse: 78 79 71 77  Resp: 16 16 18 18   Temp: 98 F (36.7 C) 97.9 F (36.6 C) 97.8 F (36.6 C) 98.1 F (36.7 C)  TempSrc:  Oral Oral Oral Oral  SpO2: 97% 99% 97% 98%  Weight:      Height:        GEN- The patient is well appearing, alert and oriented x 3 today.   Head- normocephalic, atraumatic Eyes-  Sclera clear, conjunctiva pink Ears- hearing intact Oropharynx- clear Neck- supple Lungs- Clear to ausculation bilaterally,  normal work of breathing Heart- Regular rate and rhythm, no murmurs, rubs or gallops  GI- soft, NT, ND, + BS Extremities- no clubbing, cyanosis, or edema MS- no significant deformity or atrophy Skin- no rash or lesion Psych- euthymic mood, full affect   Labs:   Lab Results  Component Value Date   WBC 6.2 09/12/2016   HGB 15.0 09/12/2016   HCT 44.2 09/12/2016   MCV 84.4 09/12/2016   PLT 204 09/12/2016    Recent Labs Lab 09/12/16 1125  NA 138  K 3.8  CL 100*  CO2 30  BUN 15  CREATININE 1.04  CALCIUM 9.4  PROT 7.0  BILITOT 1.1  ALKPHOS 41  ALT 15*  AST 21  GLUCOSE 106*    Radiology/Studies: Dg Chest 2 View  Result Date: 09/12/2016 CLINICAL DATA:  Stroke EXAM: CHEST  2 VIEW COMPARISON:  CTA chest dated 06/07/2014 FINDINGS: Lungs are clear.  No pleural effusion or pneumothorax. Heart is normal in size.  Prosthetic mitral valve. Median sternotomy. Visualized osseous structures are within normal limits. IMPRESSION: Normal chest radiographs. Electronically Signed   By: Julian Hy M.D.   On: 09/12/2016 14:52   Mr Brain Wo Contrast  Result Date: 09/12/2016 CLINICAL DATA:  Dysarthria.  Slurred speech EXAM: MRI HEAD WITHOUT CONTRAST TECHNIQUE: Multiplanar, multiecho pulse sequences of the brain and surrounding structures were obtained without intravenous contrast. COMPARISON:  None. FINDINGS: Brain: Acute infarct in the left posterior insula and parietal operculum. No other acute infarct. No significant chronic ischemia. Ventricle size normal. Negative for hemorrhage or mass. Vascular: Normal arterial flow void Skull and upper cervical spine: Negative Sinuses/Orbits:  Mild mucosal edema paranasal sinuses.  Normal orbit Other: None IMPRESSION: Acute infarct left posterior insula and parietal operculum. Otherwise negative Electronically Signed   By: Franchot Gallo M.D.   On: 09/12/2016 13:41   Mr Jodene Nam Head/brain PJ Cm  Result Date: 09/12/2016 CLINICAL DATA:  Followup acute left brain infarction. EXAM: MRA HEAD WITHOUT CONTRAST TECHNIQUE: Angiographic images of the Circle of Willis were obtained using MRA technique without intravenous contrast. COMPARISON:  09/12/2016 FINDINGS: Both internal carotid arteries are widely patent through the skullbase and siphon regions. The anterior and middle cerebral vessels are patent bilaterally without proximal stenosis, aneurysm or vascular malformation. No missing branch vessels are detected, with specific attention to the region of infarction on the left. Incidental azygos anterior cerebral artery. The right vertebral artery supplies PICA and gives a minimal contribution to the basilar. The left vertebral artery is dominant, widely patent to the basilar. No basilar stenosis. Posterior circulation branch vessels are normal. IMPRESSION: No large or medium vessel pathologic finding. No evidence of correctable left MCA stenosis. No discernible missing branch vessel on the left. Electronically Signed   By: Nelson Chimes M.D.   On: 09/12/2016 15:00    12-lead ECG sinus rhythm, rate 74 All prior EKG's in EPIC reviewed with no documented atrial fibrillation  Telemetry sinus rhythm  Assessment and Plan:  1. Cryptogenic stroke The patient presents with cryptogenic stroke.  The patient has a TEE planned for later today.  I spoke at length with the patient about monitoring for afib with an implantable loop recorder.  Risks, benefits, and alteratives to implantable loop recorder were discussed with the patient today.   At this time, the patient is very clear in their decision to proceed with implantable loop recorder.   Wound care was  reviewed with the patient (keep incision clean and dry for 3 days).  Please call with questions.   Chanetta Marshall, NP 09/14/2016 9:26 AM   I have seen, examined the patient, and reviewed the above assessment and plan.  On exam, expressive aphasia.  Changes to above are made where necessary.  Pt with no known h/o afib.  Given prior mitral valve disease, I worry about atrial fibrillation as a possible cause.  Telemetry here has revealed no arrhythmias.  Risks and benefits to ILR implant discussed with the patient who wishes to proceed if TEE is unrevealing.  Co Sign: Thompson Grayer, MD 09/14/2016 12:43 PM

## 2016-09-14 NOTE — Progress Notes (Signed)
Patient sedated for procedure.  Once the TEE scope was inserted, the [place] machine was unable to work.  The scope was changed, but the [place] machine continued to show error message.  Exchanged machines, patient was given additional sedation, but when probe was passed, there was no image on the machine.  Dr. Radford Pax and this nurse felt it was in the patient's best interest to no longer attempt probe placement and continue to give sedation.  Will continue to monitor at this time.

## 2016-09-14 NOTE — Progress Notes (Signed)
Consent signed and place in chart.   Ave Filter, RN

## 2016-09-14 NOTE — Progress Notes (Signed)
STROKE TEAM PROGRESS NOTE   HISTORY OF PRESENT ILLNESS (per record) Kevin Bass is a 76 y.o. male with a history of colon cancer, hyperlipidemia, previous mitral valve repair (not replacement) who presents with aphasia that started at 9 AM on 09/11/2016. He stated that he had already noticed some improvement during his initial Neurology consultation on 09/12/2016.  He denies any numbness, weakness, difficult due to vision, or other signs. He presented to the emergency Department for evaluation of his aphasia and an MRI was performed which demonstrated a left cortical infarct.  LKW: 9 AM 09/11/2016  Patient was not administered IV t-PA secondary to arriving outside of the treatment window. He was admitted to General Neurology for further evaluation and treatment.   SUBJECTIVE (INTERVAL HISTORY) His wife is at the bedside.  The patient is awake, alert, and follows all commands appropriately.   No new complaints.  TEE and loop pending.   OBJECTIVE Temp:  [97.8 F (36.6 C)-98.8 F (37.1 C)] 98.5 F (36.9 C) (07/24 1400) Pulse Rate:  [71-79] 79 (07/24 0954) Cardiac Rhythm: Normal sinus rhythm (07/24 0839) Resp:  [16-20] 16 (07/24 1400) BP: (112-146)/(58-87) 146/87 (07/24 1400) SpO2:  [97 %-99 %] 99 % (07/24 1400)  CBC:   Recent Labs Lab 09/12/16 1125  WBC 6.2  NEUTROABS 4.3  HGB 15.0  HCT 44.2  MCV 84.4  PLT 034    Basic Metabolic Panel:   Recent Labs Lab 09/12/16 1125  NA 138  K 3.8  CL 100*  CO2 30  GLUCOSE 106*  BUN 15  CREATININE 1.04  CALCIUM 9.4    Lipid Panel:     Component Value Date/Time   CHOL 185 09/12/2016 1430   TRIG 94 09/12/2016 1430   HDL 42 09/12/2016 1430   CHOLHDL 4.4 09/12/2016 1430   VLDL 19 09/12/2016 1430   LDLCALC 124 (H) 09/12/2016 1430   HgbA1c:  Lab Results  Component Value Date   HGBA1C 5.6 09/12/2016   Urine Drug Screen: No results found for: LABOPIA, COCAINSCRNUR, LABBENZ, AMPHETMU, THCU, LABBARB  Alcohol Level No  results found for: [facility]  IMAGING  Dg Chest 2 View 09/12/2016 IMPRESSION: Normal chest radiographs.  Mr Brain Wo Contrast 09/12/2016 IMPRESSION: Acute infarct left posterior insula and parietal operculum. Otherwise negative   Mr Jodene Nam Head/brain Wo Cm 09/12/2016 IMPRESSION: No large or medium vessel pathologic finding. No evidence of correctable left MCA stenosis. No discernible missing branch vessel on the left.   Carotid US 09/12/2016 Summary: - The vertebral arteries appear patent with antegrade flow. - Findings consistent with 1- 39 percent stenosis involving the right internal carotid artery and the left internal carotid artery.  2D Echo 09/13/2016 Impressions: - LVEF 60-65%, mild LVH, normal wall motion, grade 1 DD, indeterminate LV filling pressure, prior MV repair without significant stenosis or regurgitation, normal LA size, upper normal RA size, trivial TR, RVSP 19 mmHg, normal IVC.  TEE 09/14/2016 pending   PHYSICAL EXAM Pleasant elderly caucasian male not in distress. . Afebrile. Head is nontraumatic. Neck is supple without bruit.    Cardiac exam no murmur or gallop. Lungs are clear to auscultation. Distal pulses are well felt. Neurological Exam ;  Awake  Alert oriented x 3. Normal speech and language except occasional word finding difficulty. Slight diff.eye movements full without nystagmus.fundi were not visualized. Vision acuity and fields appear normal. Hearing is normal. Palatal movements are normal. Face symmetric. Tongue midline. Normal strength, tone, reflexes and coordination. Normal sensation. Gait deferred.  ASSESSMENT/PLAN Mr.  Kevin Bass is a 77 y.o. male with history of colon cancer, hyperlipidemia, previous mitral valve repair (not replacement) who presents with aphasia that started at 9 AM on 09/11/2016.  He did not receive IV t-PA due to arriving outside of the treatment window.  Stroke: Acute infarct left posterior insula and parietal operculum,  likely embolic, of undetermined etiology  Resultant mild aphasia  MRI head: Acute infarct left posterior insula and parietal operculum.  MRA head: No LVO or high-grade stenosis.  Carotid Doppler: B ICA 1-39% stenosis, VAs antegrade  2D Echo: Grade 1 diastolic dysfunction.  EF 60-65%. No source of embolus   LDL 124  HgbA1c 5.5  Lovenox 40 mg sq daily for VTE prophylaxis Diet NPO time specified Except for: Sips with Meds  aspirin 81 mg daily prior to admission, now on clopidogrel 75 mg daily  Patient counseled to be compliant with his antithrombotic medications  Ongoing aggressive stroke risk factor management  Therapy recommendations: None Disposition:  home Hypertension  Stable  Permissive hypertension (OK if < 220/120) but gradually normalize in 5-7 days  Long-term BP goal normotensive  Hyperlipidemia  Home meds: none  LDL 124, goal < 70  Statins held due to documented intolerance  Other Stroke Risk Factors  Advanced age  ETOH use, advised to drink no more than 2 drink(s) a day  History of cancer  History of mitral valve repair  Other Active Problems  None  Hospital day # 2   . Continue Plavix for stroke prevention and add statin for elevated lipids.discussed with patient and wife and answered questions. Patient interested in participation in the [facility] stroke study and will give him information to review. Discussed with Dr. Tana Coast. Stroke team will sign off. Kindly follow-up as an outpatient in stroke clinic in 6 weeks  Antony Contras, [place] Pager: [phone] 09/14/2016 2:44 PM   To contact Stroke Continuity provider, please refer to [email or web address] After hours, contact General Neurology

## 2016-09-14 NOTE — CV Procedure (Signed)
    PROCEDURE NOTE:  Procedure:  Transesophageal echocardiogram Operator:  Fransico Him, MD Indications:  CVA Complications: None  During this procedure the patient is administered a total of Versed 5 mg and Fentanyl 75 mcg to achieve and maintain moderate conscious sedation.  The patient's heart rate, blood pressure, and oxygen saturation are monitored continuously during the procedure. The period of conscious sedation is 37 minutes, of which I was present face-to-face 100% of this time.  The patient was adequately sedated initially and the probe was inserted without any difficulty.  After insertion no images were viewed and TEE machine read an error.  The probe was removed and machine restarted but kept reading an error.  A different probe was connected to the TEE machine with similar error.  A new machine was obtained and by the time it was turned on the patient was awake and had to be resedated.  Then the IV no longer worked and a new IV had to be restarted.  The patient was resedated with Versed and Fentanyl for a total for the whole procedure as stated above.  The probe was reinserted with no images visualized.  The dials on the probe appeared to be working though unlike with the other 2 probes.  There was no resistance met on either intubation of the TEE probe.  It was decided to terminate the procedure.    Case Discussed with Dr. Tana Coast.  Unlikely to be an esophageal perforation but will get a chest xray to followup.     He  was transferred back to his room in stable  Condition.    Kevin Bass 09/14/2016, 1:25 PM

## 2016-09-15 ENCOUNTER — Encounter (HOSPITAL_COMMUNITY)
Admission: EM | Disposition: A | Discharge status: Home / Self Care | Payer: Self-pay | Source: Home / Self Care | Attending: Internal Medicine

## 2016-09-15 ENCOUNTER — Encounter: Payer: Self-pay | Admitting: Internal Medicine

## 2016-09-15 DIAGNOSIS — I639 Cerebral infarction, unspecified: Secondary | ICD-10-CM

## 2016-09-15 LAB — HEMOGLOBIN A1C
Hgb A1c MFr Bld: 5.6 % (ref 4.8–5.6)
Mean Plasma Glucose: 114 mg/dL

## 2016-09-15 SURGERY — LOOP RECORDER INSERTION

## 2016-09-15 MED ORDER — LIDOCAINE-EPINEPHRINE 1 %-1:100000 IJ SOLN
INTRAMUSCULAR | Status: AC
  Filled 2016-09-15: qty 1

## 2016-09-15 NOTE — Progress Notes (Signed)
Discharge instructions reviewed with patient and wife. All questions answered at this time. Transport home by family.   Ave Filter, RN

## 2016-09-15 NOTE — Discharge Summary (Signed)
Physician Discharge Summary  Kevin Bass RSW:[id number] DOB: 07-27-1940 DOA: 09/12/2016  PCP: Colon Branch, MD  Admit date: 09/12/2016 Discharge date: 09/15/2016  Admitted From: Home Discharge disposition: Home   Recommendations for Outpatient Follow-Up:   1. Will need outpatient loop recorder per cardiology and neurology recommendations. Arranged, and cardiologist will call him next week for an appointment time.  Discharge Diagnosis:   Principal Problem:   Acute cerebral infarction [facility]) Active Problems:   Hyperglycemia   HTN (hypertension)   Anxiety   GERD (gastroesophageal reflux disease)  Discharge Condition: Improved.  Diet recommendation: Low sodium, heart healthy.     History of Present Illness:   76 year old male with PMH of hypertension, hyperlipidemia, GERD, history of mitral valve repair, colon cancer status post partial colectomy was admitted for evaluation of slurred speech and word finding difficulty. MRI confirmed acute infarct in the left posterior insula and parietal operculum.   Hospital Course by Problem:    Principal Problem:   Acute cerebral infarction (HCC)With slurred speech, possible embolic phenomena MRI of the brain showed acute infarct in the left posterior insula and parietal operculum. MRA showed no large or medium vessel pathological finding, no evidence of correctable left MCA stenosis. 2-D echo showed EF of 09-38%, grade 1 diastolic dysfunction mitral valve status post repair no significant stenosis or regurgitation. Carotid Dopplers 1-39% ICA stenosis bilaterally. TEE attempted but unsuccessful secondary to equipment failure. LDL 124, on Zetia, allergic to statins, Counseled about low-fat diet for better cholesterol control. Hemoglobin A1c 5.6. PT/ST ordered at discharge and placed on Plavix. Outpatient TEE and loop recorder set up. Swallowing evaluation with Gastrografin done to rule out esophageal complications from prolonged attempt  at TEE. No evidence of esophageal complications.  Active Problems: HTN (hypertension) Currently stable, permissive hypertension for 24-48 hours, resume metoprolol, HCTZ at discharge.    Anxiety Continue Xanax as needed.     GERD (gastroesophageal reflux disease) Continue PPI.  Medical Consultants:    Cardiology  Neurology   Discharge Exam:   Vitals:   09/15/16 0058 09/15/16 0442  BP: 102/63 105/60  Pulse: 84 79  Resp: 18 18  Temp: 100 F (37.8 C) 99.5 F (37.5 C)   Vitals:   09/14/16 1911 09/14/16 2111 09/15/16 0058 09/15/16 0442  BP: 122/65 134/68 102/63 105/60  Pulse: 74 80 84 79  Resp: 20 18 18 18   Temp: 98.2 F (36.8 C) 99.9 F (37.7 C) 100 F (37.8 C) 99.5 F (37.5 C)  TempSrc: Oral Oral Oral Oral  SpO2: 98% 100% 94% 95%  Weight:      Height:        General exam: Appears anxious, but comfortable. Respiratory system: Clear to auscultation. Respiratory effort normal. Cardiovascular system: S1 & S2 heard, RRR. No JVD,  rubs, gallops or clicks. No murmurs. Gastrointestinal system: Abdomen is nondistended, soft and nontender. No organomegaly or masses felt. Normal bowel sounds heard. Central nervous system: Alert and oriented 3. Mild word finding difficulty. No visual field deficits. Facial movements appear to be intact.. Extremities: No clubbing,  or cyanosis. No edema. Skin: No rashes, lesions or ulcers. Psychiatry: Judgement and insight appear impaired. Mood & affect anxious.    The results of significant diagnostics from this hospitalization (including imaging, microbiology, ancillary and laboratory) are listed below for reference.     Procedures and Diagnostic Studies:   Dg Chest 2 View  Result Date: 09/12/2016 CLINICAL DATA:  Stroke EXAM: CHEST  2 VIEW COMPARISON:  CTA chest dated 06/07/2014  FINDINGS: Lungs are clear.  No pleural effusion or pneumothorax. Heart is normal in size.  Prosthetic mitral valve. Median sternotomy. Visualized osseous  structures are within normal limits. IMPRESSION: Normal chest radiographs. Electronically Signed   By: Julian Hy M.D.   On: 09/12/2016 14:52   Mr Brain Wo Contrast  Result Date: 09/12/2016 CLINICAL DATA:  Dysarthria.  Slurred speech EXAM: MRI HEAD WITHOUT CONTRAST TECHNIQUE: Multiplanar, multiecho pulse sequences of the brain and surrounding structures were obtained without intravenous contrast. COMPARISON:  None. FINDINGS: Brain: Acute infarct in the left posterior insula and parietal operculum. No other acute infarct. No significant chronic ischemia. Ventricle size normal. Negative for hemorrhage or mass. Vascular: Normal arterial flow void Skull and upper cervical spine: Negative Sinuses/Orbits: Mild mucosal edema paranasal sinuses.  Normal orbit Other: None IMPRESSION: Acute infarct left posterior insula and parietal operculum. Otherwise negative Electronically Signed   By: Franchot Gallo M.D.   On: 09/12/2016 13:41   Mr Jodene Nam Head/brain ZO Cm  Result Date: 09/12/2016 CLINICAL DATA:  Followup acute left brain infarction. EXAM: MRA HEAD WITHOUT CONTRAST TECHNIQUE: Angiographic images of the Circle of Willis were obtained using MRA technique without intravenous contrast. COMPARISON:  09/12/2016 FINDINGS: Both internal carotid arteries are widely patent through the skullbase and siphon regions. The anterior and middle cerebral vessels are patent bilaterally without proximal stenosis, aneurysm or vascular malformation. No missing branch vessels are detected, with specific attention to the region of infarction on the left. Incidental azygos anterior cerebral artery. The right vertebral artery supplies PICA and gives a minimal contribution to the basilar. The left vertebral artery is dominant, widely patent to the basilar. No basilar stenosis. Posterior circulation branch vessels are normal. IMPRESSION: No large or medium vessel pathologic finding. No evidence of correctable left MCA stenosis. No  discernible missing branch vessel on the left. Electronically Signed   By: Nelson Chimes M.D.   On: 09/12/2016 15:00     Labs:   Basic Metabolic Panel:  Recent Labs Lab 09/12/16 1125  NA 138  K 3.8  CL 100*  CO2 30  GLUCOSE 106*  BUN 15  CREATININE 1.04  CALCIUM 9.4   GFR Estimated Creatinine Clearance: 60.4 mL/min (by C-G formula based on SCr of 1.04 mg/dL). Liver Function Tests:  Recent Labs Lab 09/12/16 1125  AST 21  ALT 15*  ALKPHOS 41  BILITOT 1.1  PROT 7.0  ALBUMIN 4.1   No results for input(s): LIPASE, AMYLASE in the last 168 hours. No results for input(s): AMMONIA in the last 168 hours. Coagulation profile  Recent Labs Lab 09/12/16 1125  INR 1.08    CBC:  Recent Labs Lab 09/12/16 1125  WBC 6.2  NEUTROABS 4.3  HGB 15.0  HCT 44.2  MCV 84.4  PLT 204   Cardiac Enzymes: No results for input(s): CKTOTAL, CKMB, CKMBINDEX, TROPONINI in the last 168 hours. BNP: Invalid input(s): POCBNP CBG: No results for input(s): GLUCAP in the last 168 hours. D-Dimer No results for input(s): DDIMER in the last 72 hours. Hgb A1c  Recent Labs  09/12/16 1430  HGBA1C 5.6   Lipid Profile  Recent Labs  09/12/16 1430  CHOL 185  HDL 42  LDLCALC 124*  TRIG 94  CHOLHDL 4.4   Thyroid function studies No results for input(s): TSH, T4TOTAL, T3FREE, THYROIDAB in the last 72 hours.  Invalid input(s): FREET3 Anemia work up No results for input(s): VITAMINB12, FOLATE, FERRITIN, TIBC, IRON, RETICCTPCT in the last 72 hours. Microbiology No results found for  this or any previous visit (from the past 240 hour(s)).   Discharge Instructions:   Discharge Instructions    Call MD for:  difficulty breathing, headache or visual disturbances    Complete by:  As directed    Call MD for:  persistant dizziness or light-headedness    Complete by:  As directed    Call MD for:  severe uncontrolled pain    Complete by:  As directed    Call MD for:  temperature >100.4     Complete by:  As directed    Diet - low sodium heart healthy    Complete by:  As directed    Increase activity slowly    Complete by:  As directed      Allergies as of 09/15/2016      Reactions   Statins Other (See Comments)   Other reaction(s): Myalgias (intolerance)      Medication List    STOP taking these medications   aspirin 81 MG tablet     TAKE these medications   ALPRAZolam 0.25 MG tablet Commonly known as:  XANAX Take 1-2 tablets (0.25-0.5 mg total) by mouth 2 (two) times daily as needed for anxiety.   ARGININE PO Take 3 tablets by mouth daily.   clopidogrel 75 MG tablet Commonly known as:  PLAVIX Take 1 tablet (75 mg total) by mouth daily.   ezetimibe 10 MG tablet Commonly known as:  ZETIA Take 1 tablet (10 mg total) by mouth daily. What changed:  when to take this   FISH OIL PO Take 1 capsule by mouth daily.   gabapentin 300 MG capsule Commonly known as:  NEURONTIN Take 1 capsule (300 mg total) by mouth 2 (two) times daily.   hydrochlorothiazide 25 MG tablet Commonly known as:  HYDRODIURIL Take 25 mg by mouth at bedtime.   loratadine 10 MG tablet Commonly known as:  CLARITIN Take 10 mg by mouth daily as needed for allergies.   metoprolol tartrate 25 MG tablet Commonly known as:  LOPRESSOR Take 1 tablet (25 mg total) by mouth 2 (two) times daily.   OCUVITE PRESERVISION PO Take 1 tablet by mouth 2 (two) times daily.   omeprazole 40 MG capsule Commonly known as:  PRILOSEC Take 1 capsule (40 mg total) by mouth daily. What changed:  when to take this  reasons to take this   Howard Take 1 tablet by mouth daily.   RED YEAST RICE PO Take 2 tablets by mouth daily.   UNABLE TO FIND Outpatient physical therapy, occupational therapy, speech therapy  Diagnosis: CVA      Follow-up Information    Colon Branch, MD. Schedule an appointment as soon as possible for a visit in 2 week(s).   Specialty:  Internal Medicine Contact  information: [address] [place] [address] [phone]        Garvin Fila, MD. Schedule an appointment as soon as possible for a visit in 2 week(s).   Specialties:  Neurology, Radiology Contact information: [address] [place] [address] [phone]            Time coordinating discharge: 35 minutes  Signed:  Annete Ayuso  Pager [phone] [facility] 09/15/2016, 9:10 AM

## 2016-09-15 NOTE — Progress Notes (Signed)
Physical Therapy Treatment Patient Details Name: Kevin Bass MRN: [id number] DOB: 12-Dec-1940 Today's Date: 09/15/2016    History of Present Illness Kevin Bass is a 76 y.o. male with a history of colon cancer, hyperlipidemia, previous mitral valve repair (not replacement) who presents with aphasia.  MRI was performed which demonstrated a left cortical infarct.    PT Comments    Patient seen for mobility progression. Improvements noted in higher level balance task performance today. Improved stability throughout activity. Anticipate patient will be safe for d/c home when medically stable.  Patient did express some concerns re:low grade fever.     Follow Up Recommendations  Supervision for mobility/OOB     Equipment Recommendations  None recommended by PT    Recommendations for Other Services       Precautions / Restrictions Precautions Precautions: Fall Restrictions Weight Bearing Restrictions: No    Mobility  Bed Mobility Overal bed mobility: Modified Independent             General bed mobility comments: Increased time  Transfers Overall transfer level: Modified independent Equipment used: None Transfers: Sit to/from Stand Sit to Stand: Modified independent (Device/Increase time)         General transfer comment: Increased time.   Ambulation/Gait Ambulation/Gait assistance: Independent Ambulation Distance (Feet): 410 Feet Assistive device: None Gait Pattern/deviations: WFL(Within Functional Limits) Gait velocity: decreased   General Gait Details: patient with improvements noted in stability, still with ocassional decrease in gait speed but overall symmetrical gait pattern    Stairs Stairs: Yes   Stair Management: No rails Number of Stairs: 4 General stair comments: increased time but did not require rails or assist  Wheelchair Mobility    Modified Rankin (Stroke Patients Only) Modified Rankin (Stroke Patients Only) Pre-Morbid  Rankin Score: No symptoms Modified Rankin: Moderate disability     Balance Overall balance assessment: Needs assistance Sitting-balance support: No upper extremity supported;Feet supported Sitting balance-Leahy Scale: Good     Standing balance support: No upper extremity supported Standing balance-Leahy Scale: Good               High level balance activites: Side stepping;Backward walking;Direction changes;Turns;Sudden stops;Head turns High Level Balance Comments: steady with higher level balance tasks today Standardized Balance Assessment Standardized Balance Assessment : Dynamic Gait Index   Dynamic Gait Index Level Surface: Normal Change in Gait Speed: Normal Gait with Horizontal Head Turns: Normal Gait with Vertical Head Turns: Normal Gait and Pivot Turn: Normal Step Over Obstacle: Mild Impairment Step Around Obstacles: Mild Impairment Steps: Normal Total Score: 22      Cognition Arousal/Alertness: Awake/alert Behavior During Therapy: Flat affect Overall Cognitive Status: Impaired/Different from baseline Area of Impairment: Problem solving;Following commands;Memory                     Memory: Decreased short-term memory Following Commands: Follows one step commands consistently     Problem Solving: Slow processing General Comments: flat affect but improvements in following commands with consistency today      Exercises      General Comments        Pertinent Vitals/Pain Pain Assessment: No/denies pain    Home Living Family/patient expects to be discharged to:: Private residence Living Arrangements: Spouse/significant other Available Help at Discharge: Family;Available 24 hours/day Type of Home: House Home Access: Stairs to enter Entrance Stairs-Rails: None Home Layout: Two level;Bed/bath upstairs;1/2 bath on main level Home Equipment: Cane - single point;Shower seat - built in      Prior  Function Level of Independence: Independent       Comments: Drives   PT Goals (current goals can now be found in the care plan section) Acute Rehab PT Goals Patient Stated Goal: None stated PT Goal Formulation: With patient/family Time For Goal Achievement: 09/19/16 Potential to Achieve Goals: Good Progress towards PT goals: Progressing toward goals    Frequency    Min 4X/week      PT Plan      Co-evaluation              AM-PAC PT "6 Clicks" Daily Activity  Outcome Measure  Difficulty turning over in bed (including adjusting bedclothes, sheets and blankets)?: None Difficulty moving from lying on back to sitting on the side of the bed? : None Difficulty sitting down on and standing up from a chair with arms (e.g., wheelchair, bedside commode, etc,.)?: None Help needed moving to and from a bed to chair (including a wheelchair)?: None Help needed walking in hospital room?: None Help needed climbing 3-5 steps with a railing? : A Little 6 Click Score: 23    End of Session Equipment Utilized During Treatment: Gait belt Activity Tolerance: Patient tolerated treatment well Patient left: in bed;with call bell/phone within reach;with family/visitor present Nurse Communication: Mobility status PT Visit Diagnosis: Unsteadiness on feet (R26.81);Other symptoms and signs involving the nervous system (R29.898);Hemiplegia and hemiparesis Hemiplegia - Right/Left: Right Hemiplegia - dominant/non-dominant: Dominant Hemiplegia - caused by: Cerebral infarction     Time: 6837-2902 PT Time Calculation (min) (ACUTE ONLY): 18 min  Charges:  $Gait Training: 8-22 mins                    G Codes:       Alben Deeds, PT DPT  Board Certified Neurologic Specialist [place] 09/15/2016, 9:10 AM

## 2016-09-15 NOTE — Discharge Instructions (Signed)
TEE and loop recorder scheduled for Tuesday, September 21, 2016.  Arrive to [facility] admitting at [place] morning of admission. Nothing to eat or drink after midnight Monday night.  You will need someone to drive you home.   Ischemic Stroke An ischemic stroke (cerebrovascular accident, or CVA) is the sudden death of brain tissue that occurs when an area of the brain does not get enough oxygen. It is a medical emergency that must be treated right away. An ischemic stroke can cause permanent loss of brain function. This can cause problems with how different parts of your body function. What are the causes? This condition is caused by a decrease of oxygen supply to an area of the brain, which may be the result of:  A small blood clot (embolus) or a buildup of plaque in the blood vessels (atherosclerosis) that blocks blood flow in the brain.  An abnormal heart rhythm (atrial fibrillation).  A blocked or damaged artery in the head or neck.  What increases the risk? Certain factors may make you more likely to develop this condition. Some of these factors are things that you can change, such as:  Obesity.  Smoking cigarettes.  Taking oral birth control, especially if you also use tobacco.  Physical inactivity.  Excessive alcohol use.  Use of illegal drugs, especially cocaine and methamphetamine.  Other risk factors include:  High blood pressure (hypertension).  High cholesterol.  Diabetes mellitus.  Heart disease.  Being [place] American, Native American, Hispanic, or [place] Native.  Being over age 46.  Family history of stroke.  Previous history of blood clots, stroke, or transient ischemic attack (TIA).  Sickle cell disease.  Being a woman with a history of preeclampsia.  Migraine headache.  Sleep apnea.  Irregular heartbeats, such as atrial fibrillation.  Chronic inflammatory diseases, such as rheumatoid arthritis or lupus.  Blood clotting disorders (hypercoagulable  state).  What are the signs or symptoms? Symptoms of this condition usually develop suddenly, or you may notice them after waking up from sleep. Symptoms may include sudden:  Weakness or numbness in your face, arm, or leg, especially on one side of your body.  Trouble walking or difficulty moving your arms or legs.  Loss of balance or coordination.  Confusion.  Slurred speech (dysarthria).  Trouble speaking, understanding speech, or both (aphasia).  Vision changes--such as double vision, blurred vision, or loss of vision--inone or both eyes.  Dizziness.  Nausea and vomiting.  Severe headache with no known cause. The headache is often described as the worst headache ever experienced.  If possible, make note of the exact time that you last felt like your normal self and what time your symptoms started. Tell your health care provider. If symptoms come and go, this could be a sign of a warning stroke, or TIA. Get help right away, even if you feel better. How is this diagnosed? This condition may be diagnosed based on:  Your symptoms, your medical history, and a physical exam.  CT scan of the brain.  MRI.  CT angiogram. This test uses a computer to take X-rays of your arteries. A dye may be injected into your blood to show the inside of your blood vessels more clearly.  MRI angiogram. This is a type of MRI that is used to evaluate the blood vessels.  Cerebral angiogram. This test uses X-rays and a dye to show the blood vessels in the brain and neck.  You may need to see a health care provider who  specializes in stroke care. A stroke specialist can be seen in person or through communication using telephone or television technology (telemedicine). Other tests may also be done to find the cause of the stroke, such as:  Electrocardiogram (ECG).  Continuous heart monitoring.  Echocardiogram.  Carotid ultrasound.  A scan of the brain circulation.  Blood tests.  Sleep  study to check for sleep apnea.  How is this treated? Treatment for this condition will depend on the duration, severity, and cause of your symptoms and on the area of the brain affected. It is very important to get treatment at the first sign of stroke symptoms. Some treatments work better if they are done within 3-6 hours of the onset of stroke symptoms. These initial treatments may include:  Aspirin.  Medicines to control blood pressure.  Medicine given by injection to dissolve the blood clot (thrombolytic).  Treatments given directly to the affected artery to remove or dissolve the blood clot.  Other treatment options may include:  Oxygen.  IV fluids.  Medicines to thin the blood (anticoagulants or antiplatelets).  Procedures to increase blood flow.  Medicines and changes to your diet may be used to help treat and manage risk factors for stroke, such as diabetes, high cholesterol, and high blood pressure. After a stroke, you may work with physical, speech, mental health, or occupational therapists to help you recover. Follow these instructions at home: Medicines  Take over-the-counter and prescription medicines only as told by your health care provider.  If you were told to take a medicine to thin your blood, such as aspirin or an anticoagulant, take it exactly as told by your health care provider. ? Taking too much blood-thinning medicine can cause bleeding. ? If you do not take enough blood-thinning medicine, you will not have the protection that you need against another stroke and other problems.  Understand the side effects of taking anticoagulant medicine. When taking this type of medicine, make sure you: ? Hold pressure over any cuts for longer than usual. ? Tell your dentist and other health care providers that you are taking anticoagulants before you have any procedures that may cause bleeding. ? Avoid activities that may cause trauma or injury. Eating and  drinking  Follow instructions from your health care provider about diet.  Eat healthy foods.  If your ability to swallow was affected by the stroke, you may need to take steps to avoid choking, such as: ? Taking small bites when eating. ? Eating foods that are soft or pureed. Safety  Follow instructions from your health care team about physical activity.  Use a walker or cane as told by your health care provider.  Take steps to create a safe home environment in order to reduce the risk of falls. This may include: ? Having your home looked at by specialists. ? Installing grab bars in the bedroom and bathroom. ? Using safety equipment, such as raised toilets and a seat in the shower. General instructions  Do not use any tobacco products, such as cigarettes, chewing tobacco, and e-cigarettes. If you need help quitting, ask your health care provider.  Limit alcohol intake to no more than 1 drink a day for nonpregnant women and 2 drinks a day for men. One drink equals 12 oz of beer, 5 oz of wine, or 1 oz of hard liquor.  If you need help to stop using drugs or alcohol, ask your health care provider about a referral to a program or specialist.  Maintain  an active and healthy lifestyle. Get regular exercise as told by your health care provider.  Keep all follow-up visits as told by your health care provider, including visits with all specialists on your health care team. This is important. How is this prevented? Your risk of another stroke can be decreased by managing high blood pressure, high cholesterol, diabetes, heart disease, sleep apnea, and obesity. It can also be decreased by quitting smoking, limiting alcohol, and staying physically active. Your health care provider will continue to work with you on measures to prevent short-term and long-term complications of stroke. Get help right away if: You have:  Sudden weakness or numbness in your face, arm, or leg, especially on one  side of your body.  Sudden confusion.  Sudden trouble speaking, understanding, or both (aphasia).  Sudden trouble seeing with one or both eyes.  Sudden trouble walking or difficulty moving your arms or legs.  Sudden dizziness.  Sudden loss of balance or coordination.  Sudden, severe headache with no known cause.  A partial or total loss of consciousness.  A seizure. Any of these symptoms may represent a serious problem that is an emergency. Do not wait to see if the symptoms will go away. Get medical help right away. Call your local emergency services (911 in [place] Do not drive yourself to the hospital. This information is not intended to replace advice given to you by your health care provider. Make sure you discuss any questions you have with your health care provider. Document Released: 02/08/2005 Document Revised: 07/22/2015 Document Reviewed: 05/07/2015 Elsevier Interactive Patient Education  2017 [employer].

## 2016-09-15 NOTE — Progress Notes (Signed)
Patient declines implantable loop recorder upon my conversation with him this am. He is frustrated that TEE could not be performed successfully yesterday.  He is very clear that he would prefer to have any additional cardiology evaluation performed by his cardiologist at [facility]. I have advised that he follow-up with his general cardiologist upon discharge.  If he cannot have ILR implanted there then he can contact my office for further assistance.  Thompson Grayer MD, [facility] 09/15/2016 8:39 AM

## 2016-09-15 NOTE — Progress Notes (Signed)
STROKE TEAM PROGRESS NOTE   HISTORY OF PRESENT ILLNESS (per record) Kevin Bass is a 76 y.o. male with a history of colon cancer, hyperlipidemia, previous mitral valve repair (not replacement) who presents with aphasia that started at 9 AM on 09/11/2016. He stated that he had already noticed some improvement during his initial Neurology consultation on 09/12/2016.  He denies any numbness, weakness, difficult due to vision, or other signs. He presented to the emergency Department for evaluation of his aphasia and an MRI was performed which demonstrated a left cortical infarct.  LKW: 9 AM 09/11/2016  Patient was not administered IV t-PA secondary to arriving outside of the treatment window. He was admitted to General Neurology for further evaluation and treatment.   SUBJECTIVE (INTERVAL HISTORY) His wife is at the bedside.  The patient is awake, alert, and follows all commands appropriately.   No new complaints.  TEE could not be done and he is upset and refused loop recorder this am but after I spoke to him he is agreeable to come back for outpatient TEE followed by a loop recorder if necessary  OBJECTIVE Temp:  [98.2 F (36.8 C)-100 F (37.8 C)] 98.9 F (37.2 C) (07/25 1052) Pulse Rate:  [71-84] 74 (07/25 1052) Cardiac Rhythm: Normal sinus rhythm (07/25 0743) Resp:  [13-22] 18 (07/25 1052) BP: (102-160)/(50-105) 120/50 (07/25 1052) SpO2:  [94 %-100 %] 96 % (07/25 1052)  CBC:   Recent Labs Lab 09/12/16 1125  WBC 6.2  NEUTROABS 4.3  HGB 15.0  HCT 44.2  MCV 84.4  PLT 852    Basic Metabolic Panel:   Recent Labs Lab 09/12/16 1125  NA 138  K 3.8  CL 100*  CO2 30  GLUCOSE 106*  BUN 15  CREATININE 1.04  CALCIUM 9.4    Lipid Panel:     Component Value Date/Time   CHOL 185 09/12/2016 1430   TRIG 94 09/12/2016 1430   HDL 42 09/12/2016 1430   CHOLHDL 4.4 09/12/2016 1430   VLDL 19 09/12/2016 1430   LDLCALC 124 (H) 09/12/2016 1430   HgbA1c:  Lab Results   Component Value Date   HGBA1C 5.6 09/12/2016   Urine Drug Screen: No results found for: LABOPIA, COCAINSCRNUR, LABBENZ, AMPHETMU, THCU, LABBARB  Alcohol Level No results found for: [facility]  IMAGING  Dg Chest 2 View 09/12/2016 IMPRESSION: Normal chest radiographs.  Mr Brain Wo Contrast 09/12/2016 IMPRESSION: Acute infarct left posterior insula and parietal operculum. Otherwise negative   Mr Jodene Nam Head/brain Wo Cm 09/12/2016 IMPRESSION: No large or medium vessel pathologic finding. No evidence of correctable left MCA stenosis. No discernible missing branch vessel on the left.   Carotid US 09/12/2016 Summary: - The vertebral arteries appear patent with antegrade flow. - Findings consistent with 1- 39 percent stenosis involving the right internal carotid artery and the left internal carotid artery.  2D Echo 09/13/2016 Impressions: - LVEF 60-65%, mild LVH, normal wall motion, grade 1 DD, indeterminate LV filling pressure, prior MV repair without significant stenosis or regurgitation, normal LA size, upper normal RA size, trivial TR, RVSP 19 mmHg, normal IVC.  TEE 09/14/2016 pending   PHYSICAL EXAM Pleasant elderly caucasian male not in distress. . Afebrile. Head is nontraumatic. Neck is supple without bruit.    Cardiac exam no murmur or gallop. Lungs are clear to auscultation. Distal pulses are well felt. Neurological Exam ;  Awake  Alert oriented x 3. Normal speech and language except occasional word finding difficulty. Slight diff.eye movements full without nystagmus.fundi were  not visualized. Vision acuity and fields appear normal. Hearing is normal. Palatal movements are normal. Face symmetric. Tongue midline. Normal strength, tone, reflexes and coordination. Normal sensation. Gait deferred.  ASSESSMENT/PLAN Kevin Bass is a 76 y.o. male with history of colon cancer, hyperlipidemia, previous mitral valve repair (not replacement) who presents with aphasia that started at  55 AM on 09/11/2016.  He did not receive IV t-PA due to arriving outside of the treatment window.  Stroke: Acute infarct left posterior insula and parietal operculum, likely embolic, of undetermined etiology  Resultant mild aphasia  MRI head: Acute infarct left posterior insula and parietal operculum.  MRA head: No LVO or high-grade stenosis.  Carotid Doppler: B ICA 1-39% stenosis, VAs antegrade  2D Echo: Grade 1 diastolic dysfunction.  EF 60-65%. No source of embolus   LDL 124  HgbA1c 5.5  Lovenox 40 mg sq daily for VTE prophylaxis    aspirin 81 mg daily prior to admission, now on clopidogrel 75 mg daily  Patient counseled to be compliant with his antithrombotic medications  Ongoing aggressive stroke risk factor management  Therapy recommendations: None Disposition:  home Hypertension  Stable  Permissive hypertension (OK if < 220/120) but gradually normalize in 5-7 days  Long-term BP goal normotensive  Hyperlipidemia  Home meds: none  LDL 124, goal < 70  Statins held due to documented intolerance  Other Stroke Risk Factors  Advanced age  ETOH use, advised to drink no more than 2 drink(s) a day  History of cancer  History of mitral valve repair  Other Active Problems  None  Hospital day # 0   . Continue Plavix for stroke prevention and add statin for elevated lipids.discussed with patient and wife and answered questions. Patient interested in participation in the [place] stroke study and will give him information to review. Discussed with Dr. Rockne Menghini Stroke team will sign off. Kindly follow-up as an outpatient in stroke clinic in 6 weeks  Antony Contras, [place] Pager: [phone] 09/15/2016 2:07 PM   To contact Stroke Continuity provider, please refer to [email or web address] After hours, contact General Neurology

## 2016-09-15 NOTE — Care Management Note (Signed)
Case Management Note  Patient Details  Name: Kevin Bass MRN: [id number] Date of Birth: [birth year]  Subjective/Objective:                    Action/Plan: Pt discharging home with self care. CM consulted for outpatient therapy. CM met with the patient and his family and he would like to attend [employer]. Orders placed in EPIC and information on the AVS.  Family to provide transportation home.   Expected Discharge Date:  09/15/16               Expected Discharge Plan:  OP Rehab  In-House Referral:     Discharge planning Services  CM Consult  Post Acute Care Choice:    Choice offered to:     DME Arranged:    DME Agency:     HH Arranged:    HH Agency:     Status of Service:  Completed, signed off  If discussed at [employer] of Stay Meetings, dates discussed:    Additional Comments:  Pollie Friar, RN 09/15/2016, 11:41 AM

## 2016-09-16 ENCOUNTER — Telehealth: Payer: Self-pay | Admitting: Behavioral Health

## 2016-09-16 ENCOUNTER — Encounter (HOSPITAL_COMMUNITY): Payer: Self-pay | Admitting: Cardiology

## 2016-09-16 ENCOUNTER — Telehealth: Payer: Self-pay | Admitting: Internal Medicine

## 2016-09-16 NOTE — Telephone Encounter (Signed)
New Message     Needs the CPT code and authorization for [employer] for procedure

## 2016-09-16 NOTE — Telephone Encounter (Signed)
Ivin Booty aware that neuro ordered tee for stroke  Dx codes given  For sob ,slurred speech, and cerebral accident wanting to know about pre cert  Instructed to  Call Dr Glenna Durand office for [employer] ./cy

## 2016-09-16 NOTE — Telephone Encounter (Signed)
Transition Care Management Follow-up Telephone Call  PCP: Colon Branch, MD  Admit date: 09/12/2016 Discharge date: 09/15/2016  Admitted From: Home Discharge disposition: Home   How have you been since you were released from the hospital? Patient stated, "My speech is not 100%, but not as garbled like before & I can drive".   Do you understand why you were in the hospital? yes, patient voiced "stroke".   Do you understand the discharge instructions? yes   Where were you discharged to? Home   Items Reviewed:  Medications reviewed: yes  Allergies reviewed: yes  Dietary changes reviewed: yes, low sodium, heart healthy diet  Referrals reviewed: yes, follow-up with PCP.   Functional Questionnaire:   Activities of Daily Living (ADLs):   He states they are independent in the following: ambulation, bathing and hygiene, feeding, continence, grooming, toileting and dressing States they require assistance with the following: None   Any transportation issues/concerns?: no   Any patient concerns? no   Confirmed importance and date/time of follow-up visits scheduled yes, 09/27/16 at 11:30 AM.  Provider Appointment booked with Dr. [name].  Confirmed with patient if condition begins to worsen call PCP or go to the ER.  Patient was given the office number and encouraged to call back with question or concerns.  : yes

## 2016-09-16 NOTE — Telephone Encounter (Signed)
thx

## 2016-09-17 ENCOUNTER — Telehealth: Payer: Self-pay | Admitting: Internal Medicine

## 2016-09-17 NOTE — Telephone Encounter (Signed)
New Message  Pt wife call to cancel cath procedure on 7/31. Please call back to discuss if needed.

## 2016-09-20 DIAGNOSIS — I498 Other specified cardiac arrhythmias: Secondary | ICD-10-CM | POA: Diagnosis not present

## 2016-09-20 DIAGNOSIS — Z8673 Personal history of transient ischemic attack (TIA), and cerebral infarction without residual deficits: Secondary | ICD-10-CM | POA: Diagnosis not present

## 2016-09-20 DIAGNOSIS — I6312 Cerebral infarction due to embolism of basilar artery: Secondary | ICD-10-CM | POA: Diagnosis not present

## 2016-09-20 DIAGNOSIS — I10 Essential (primary) hypertension: Secondary | ICD-10-CM | POA: Diagnosis not present

## 2016-09-20 DIAGNOSIS — Z952 Presence of prosthetic heart valve: Secondary | ICD-10-CM | POA: Diagnosis not present

## 2016-09-20 DIAGNOSIS — E785 Hyperlipidemia, unspecified: Secondary | ICD-10-CM | POA: Diagnosis not present

## 2016-09-20 DIAGNOSIS — Z7902 Long term (current) use of antithrombotics/antiplatelets: Secondary | ICD-10-CM | POA: Diagnosis not present

## 2016-09-20 NOTE — Telephone Encounter (Signed)
Tried to call wife back but got her voicemail.  Called and canceled LINQ for 09/21/16.  Per Dr Jackalyn Lombard note: Electrophysiology     Patient declines implantable loop recorder upon my conversation with him this am. He is frustrated that TEE could not be performed successfully yesterday.  He is very clear that he would prefer to have any additional cardiology evaluation performed by his cardiologist at [facility]. I have advised that he follow-up with his general cardiologist upon discharge.  If he cannot have ILR implanted there then he can contact my office for further assistance.  Thompson Grayer MD, [facility] 09/15/2016 8:39 AM

## 2016-09-21 ENCOUNTER — Ambulatory Visit (HOSPITAL_COMMUNITY): Admission: RE | Admit: 2016-09-21 | Payer: PPO | Source: Ambulatory Visit | Admitting: Internal Medicine

## 2016-09-21 SURGERY — ECHOCARDIOGRAM, TRANSESOPHAGEAL
Anesthesia: Moderate Sedation

## 2016-09-21 SURGERY — LOOP RECORDER INSERTION

## 2016-09-21 NOTE — Progress Notes (Signed)
Patient no show for TEE procedure in Endo. Attempted to call office nurse that cancelled the LINQ, but RN is not in office this week. Will cancel TEE and inform MD.

## 2016-09-27 ENCOUNTER — Ambulatory Visit (INDEPENDENT_AMBULATORY_CARE_PROVIDER_SITE_OTHER): Payer: PPO | Admitting: Internal Medicine

## 2016-09-27 ENCOUNTER — Telehealth: Payer: Self-pay | Admitting: Cardiology

## 2016-09-27 ENCOUNTER — Encounter: Payer: Self-pay | Admitting: Internal Medicine

## 2016-09-27 VITALS — BP 122/60 | HR 67 | Temp 97.7°F | Resp 14 | Ht 70.0 in | Wt 179.4 lb

## 2016-09-27 DIAGNOSIS — I639 Cerebral infarction, unspecified: Secondary | ICD-10-CM | POA: Diagnosis not present

## 2016-09-27 DIAGNOSIS — I69320 Aphasia following cerebral infarction: Secondary | ICD-10-CM | POA: Diagnosis not present

## 2016-09-27 DIAGNOSIS — E785 Hyperlipidemia, unspecified: Secondary | ICD-10-CM | POA: Diagnosis not present

## 2016-09-27 LAB — BASIC METABOLIC PANEL
BUN: 15 mg/dL (ref 7–25)
CALCIUM: 9.4 mg/dL (ref 8.6–10.3)
CHLORIDE: 103 mmol/L (ref 98–110)
CO2: 29 mmol/L (ref 20–32)
CREATININE: 1.02 mg/dL (ref 0.70–1.18)
Glucose, Bld: 95 mg/dL (ref 65–99)
Potassium: 4.2 mmol/L (ref 3.5–5.3)
Sodium: 138 mmol/L (ref 135–146)

## 2016-09-27 LAB — CBC WITH DIFFERENTIAL/PLATELET
BASOS ABS: 64 {cells}/uL (ref 0–200)
BASOS PCT: 1 %
EOS ABS: 192 {cells}/uL (ref 15–500)
Eosinophils Relative: 3 %
HEMATOCRIT: 45.2 % (ref 38.5–50.0)
Hemoglobin: 15.1 g/dL (ref 13.2–17.1)
LYMPHS PCT: 33 %
Lymphs Abs: 2112 cells/uL (ref 850–3900)
MCH: 28.8 pg (ref 27.0–33.0)
MCHC: 33.4 g/dL (ref 32.0–36.0)
MCV: 86.3 fL (ref 80.0–100.0)
MPV: 9.7 fL (ref 7.5–12.5)
Monocytes Absolute: 448 cells/uL (ref 200–950)
Monocytes Relative: 7 %
NEUTROS ABS: 3584 {cells}/uL (ref 1500–7800)
NEUTROS PCT: 56 %
Platelets: 275 10*3/uL (ref 140–400)
RBC: 5.24 MIL/uL (ref 4.20–5.80)
RDW: 13.3 % (ref 11.0–15.0)
WBC: 6.4 10*3/uL (ref 3.8–10.8)

## 2016-09-27 MED ORDER — ROSUVASTATIN CALCIUM 20 MG PO TABS: 20.0000 mg | ORAL_TABLET | Freq: Every day | ORAL | 3 refills | Status: DC

## 2016-09-27 NOTE — Progress Notes (Signed)
Subjective:    Patient ID: Kevin Bass, male    DOB: June 02, 1940, 76 y.o.   MRN: [id number]  DOS:  09/27/2016 Type of visit - description : TCM 14 Interval history: Patient was admitted to the hospital and discharged 09/15/2016 Presented to the hospital with slurred speech, MRI showed an acute infarct, MRA show no large vessel pathology, had a echocardiogram and carotid Dopplers. TEE attempted but unsuccessful. Was seen by neurology and cardiology. They recommended a implantable loop recorder because this was considered cryptogenic stroke but the patient declined.   Review of Systems Since he left the hospital he is feeling well. Speech is better, close to normal, Some difficulty with word finding. No chest pain, palpitation. No headache, dizziness or visual disturbances. Already saw cards @ [facility] .   Past Medical History:  Diagnosis Date  . Allergy   . Anxiety   . Arthritis    hands  . Colon cancer [facility])    s/p partial colectomy 1990, Cscope neg 7-09, next 2014  . DISORDER, MITRAL VALVE    MV recontruction at [place] 2006----still needs ABX for SBE prophylaxis   . Elevated PSA    [name]  . GERD (gastroesophageal reflux disease)   . Heart murmur    no problems since surgery  . HYPERGLYCEMIA, BORDERLINE 10/16/2008  . HYPERLIPIDEMIA 06/15/2006   diet controlled  . Hypertension   . HYPERTROPHY PROSTATE W/O UR OBST & OTH LUTS 10/16/2008  . Right groin pain    Dr. Redmond Pulling  . Wears partial dentures    lower partial    Past Surgical History:  Procedure Laterality Date  . COLECTOMY  1990  . COLONOSCOPY    . HERNIA REPAIR Left 2014   [facility] rep w/mesh- March 2014  . MITRAL VALVE ANNULOPLASTY     [facility] 2006    Social History   Social History  . Marital status: Married    Spouse name: suzanne  . Number of children: 2  . Years of education: college   Occupational History  . retired     Social History Main Topics  . Smoking status: Former Smoker    Packs/day: 0.50      Years: 20.00    Types: Cigarettes    Quit date: 02/23/1983  . Smokeless tobacco: Never Used  . Alcohol use 4.2 oz/week    7 Glasses of wine per week  . Drug use: No  . Sexual activity: Not on file   Other Topics Concern  . Not on file   Social History Narrative   From [place], household pt and wife   Daughter in [place], son in [place] as of 09/27/2016      Reactions   Statins Other (See Comments)   Other reaction(s): Myalgias (intolerance)      Medication List       Accurate as of 09/27/16 11:59 PM. Always use your most recent med list.          ALPRAZolam 0.25 MG tablet Commonly known as:  XANAX Take 1-2 tablets (0.25-0.5 mg total) by mouth 2 (two) times daily as needed for anxiety.   ARGININE PO Take 1,500 mg by mouth daily.   clopidogrel 75 MG tablet Commonly known as:  PLAVIX Take 1 tablet (75 mg total) by mouth daily.   ezetimibe 10 MG tablet Commonly known as:  ZETIA Take 1 tablet (10 mg total) by mouth daily.   FISH OIL  PO Take 1 capsule by mouth daily.   gabapentin 300 MG capsule Commonly known as:  NEURONTIN Take 1 capsule (300 mg total) by mouth 2 (two) times daily.   hydrochlorothiazide 25 MG tablet Commonly known as:  HYDRODIURIL Take 25 mg by mouth at bedtime.   loratadine 10 MG tablet Commonly known as:  CLARITIN Take 10 mg by mouth daily as needed for allergies.   metoprolol tartrate 25 MG tablet Commonly known as:  LOPRESSOR Take 1 tablet (25 mg total) by mouth 2 (two) times daily.   OCUVITE PRESERVISION PO Take 1 tablet by mouth 2 (two) times daily.   omeprazole 40 MG capsule Commonly known as:  PRILOSEC Take 1 capsule (40 mg total) by mouth daily.   OVER THE COUNTER MEDICATION Take 2 tablets by mouth daily.   PROSTATE HEALTH PO Take 1 tablet by mouth daily.   RED YEAST RICE PO Take 2 tablets by mouth daily.   rosuvastatin 20 MG tablet Commonly known as:  CRESTOR Take 1 tablet (20 mg total) by mouth at  bedtime.   UNABLE TO FIND Outpatient physical therapy, occupational therapy, speech therapy  Diagnosis: CVA          Objective:   Physical Exam BP 122/60 (BP Location: Left Arm, Patient Position: Sitting, Cuff Size: Small)   Pulse 67   Temp 97.7 F (36.5 C) (Oral)   Resp 14   Ht 5\' 10"  (1.778 m)   Wt 179 lb 6 oz (81.4 kg)   SpO2 97%   BMI 25.74 kg/m  General:   Well developed, well nourished . NAD.  HEENT:  Normocephalic . Face symmetric, atraumatic Lungs:  CTA B Normal respiratory effort, no intercostal retractions, no accessory muscle use. Heart: RRR,  no murmur.  No pretibial edema bilaterally  Skin: Not pale. Not jaundice Neurologic:  alert & oriented X3.  Speech normal, gait appropriate for age and unassisted. Motor normal. EOMI.   Psych--  Cognition and judgment appear intact.  Cooperative with normal attention span and concentration.  Behavior appropriate. No anxious or depressed appearing.      Assessment & Plan:   Assessment Prediabetes HTN Hyperlipidemia, statin intolerant (simva-pravachol: shoulder pain) Anxiety- rarely takes xanax Facial neuropathy dx 2015, no w/u, sx resurface x1 after temporary d/c of meds  CV: --Mitral valve annuloplasty 2006-- needs SBE prophylaxis -- SOB 04-2016, seen at [facility], had a ECHO Nl fx MV, cath: no CAD --Cryptogenic stroke 09/12/2016, had slurred speech. Was recommended a loop recorder.started plavix Elevated PSA and BPH-- Dr. [name] Colon cancer partial colectomy 1990, colonoscopy 08-2007 negative, Cscope again 09-2012 normal, 5 years   PLAN: Acute stroke:  Have an acute stroke, sx were slurred speech, he is getting back to normal. Aspirate was switch to Plavix. Needs a neurology follow-up, likes to see a different neurology, will refer to [place]. Was recommended PT, ST by the hospital team, patient is doing well, he is unsure if he needs those services. He is a slightly more fragile than before, will arrange  outpatient PT. He already saw his heart doctor, he rec  to add aspirin according to his clinical note but the patient does not recall he was asked to restart aspirin. States he will clarify that with his heart doctor. Patient declined a loop monitor, cardiology already did a Holter monitor, results pending. The patient has a TEE scheduled to be done in [place], he is not sure if he likes to proceed, recommend to discuss with his cardiologist. He  asked me if a loop monitor is a good idea:  would be definitely necessary if the TEE and Holter are (-), because we'll have to continue exploring if he has an arrhythmia to be sure anticoagulation is not needed..   Check a BMP and CBC. Hyperlipidemia: Previously intolerant to simvastatin, Pravachol. Cardiology recommend the Crestor 40 mg, patient is not sure, I eventually recommend to continue with Zetia and add Crestor 20 mg. Will call if problems. RTC 6 weeks fasting. If intolerant,will refer to cardiology. Repatha? RTC 6 weeks

## 2016-09-27 NOTE — Telephone Encounter (Signed)
Spoke patient and will arrange tomorrow

## 2016-09-27 NOTE — Telephone Encounter (Signed)
Mr.Ruggerio is calling to reschedule his TEE . Please call

## 2016-09-27 NOTE — Patient Instructions (Signed)
GO TO THE LAB : Get the blood work     GO TO THE FRONT DESK Schedule your next appointment for a checkup in 6 weeks from now    Start Crestor, let me know if you have problems.  please clarify with your cardiologist if you need to add aspirin back  Will refer you to one of the neurologists at [facility]

## 2016-09-27 NOTE — Progress Notes (Signed)
Pre visit review using our clinic review tool, if applicable. No additional management support is needed unless otherwise documented below in the visit note. 

## 2016-09-28 NOTE — Telephone Encounter (Signed)
Spoke with patient and scheduled him new patient appointment to discuss with Dr Debara Pickett Patient seen in the hospital and had follow up with his cardiologist at [facility] who did not feel TEE needed at this time.

## 2016-09-28 NOTE — Assessment & Plan Note (Signed)
Acute stroke:  Have an acute stroke, sx were slurred speech, he is getting back to normal. Aspirate was switch to Plavix. Needs a neurology follow-up, likes to see a different neurology, will refer to [place]. Was recommended PT, ST by the hospital team, patient is doing well, he is unsure if he needs those services. He is a slightly more fragile than before, will arrange outpatient PT. He already saw his heart doctor, he rec  to add aspirin according to his clinical note but the patient does not recall he was asked to restart aspirin. States he will clarify that with his heart doctor. Patient declined a loop monitor, cardiology already did a Holter monitor, results pending. The patient has a TEE scheduled to be done in [place], he is not sure if he likes to proceed, recommend to discuss with his cardiologist. He asked me if a loop monitor is a good idea:  would be definitely necessary if the TEE and Holter are (-), because we'll have to continue exploring if he has an arrhythmia to be sure anticoagulation is not needed..   Check a BMP and CBC. Hyperlipidemia: Previously intolerant to simvastatin, Pravachol. Cardiology recommend the Crestor 40 mg, patient is not sure, I eventually recommend to continue with Zetia and add Crestor 20 mg. Will call if problems. RTC 6 weeks fasting. If intolerant,will refer to cardiology. Repatha? RTC 6 weeks

## 2016-09-30 ENCOUNTER — Encounter: Payer: Self-pay | Admitting: Internal Medicine

## 2016-09-30 ENCOUNTER — Other Ambulatory Visit: Payer: Self-pay | Admitting: *Deleted

## 2016-09-30 ENCOUNTER — Ambulatory Visit (INDEPENDENT_AMBULATORY_CARE_PROVIDER_SITE_OTHER): Payer: PPO | Admitting: Internal Medicine

## 2016-09-30 VITALS — BP 124/70 | HR 58 | Ht 70.0 in | Wt 180.0 lb

## 2016-09-30 DIAGNOSIS — Z9889 Other specified postprocedural states: Secondary | ICD-10-CM

## 2016-09-30 DIAGNOSIS — I639 Cerebral infarction, unspecified: Secondary | ICD-10-CM

## 2016-09-30 NOTE — Progress Notes (Signed)
OFFICE CONSULT NOTE  Chief Complaint:  Workup for cryptogenic stroke  Primary Care Physician: Colon Branch, MD  HPI:  Kevin Bass is a 76 y.o. male who is being seen today for the evaluation of workup of cryptogenic stroke at the request of Colon Branch, MD. Kevin Bass is a pleasant 76 year old patient who recently had an unfortunate cryptogenic stroke. His past ocular history significant for mitral valve disease status post repair at [facility]. He is followed by cardiologist there and recently was admitted to [facility] with cryptogenic stroke. As per routine care or transesophageal echocardiogram as well as a implanted loop recorder were recommended. Unfortunately there was difficulty during the transesophageal echocardiogram procedure which involved technical difficulties with equipment and it was not able to be completed. There was some concern that the probe could not be passed adequately and he underwent upper esophageal imaging which was normal. He has had prior teas without any difficulty. After that he decided against a loop recorder and wanted to get another opinion from his cardiologist at [facility]. There was a visit there which did not indicate that a transesophageal echocardiogram would be helpful. I do not see any evidence on his prior echocardiograms of a bubble study to exclude PFO. This could be a significant finding as there is evidence now that closure of a small PFO in the setting of a patient whose had a cryptogenic stroke is guideline indicated. It's also possible that he could have atrial fibrillation, especially given his history of mitral valve disease and valve repair which is a significant risk factor for arrhythmias.  PMHx:  Past Medical History:  Diagnosis Date  . Allergy   . Anxiety   . Arthritis    hands  . Colon cancer [facility])    s/p partial colectomy 1990, Cscope neg 7-09, next 2014  . DISORDER, MITRAL VALVE    MV recontruction at [place] 2006----still needs  ABX for SBE prophylaxis   . Elevated PSA    [name]  . GERD (gastroesophageal reflux disease)   . Heart murmur    no problems since surgery  . HYPERGLYCEMIA, BORDERLINE 10/16/2008  . HYPERLIPIDEMIA 06/15/2006   diet controlled  . Hypertension   . HYPERTROPHY PROSTATE W/O UR OBST & OTH LUTS 10/16/2008  . Right groin pain    Dr. Redmond Pulling  . Wears partial dentures    lower partial    Past Surgical History:  Procedure Laterality Date  . COLECTOMY  1990  . COLONOSCOPY    . HERNIA REPAIR Left 2014   [facility] rep w/mesh- March 2014  . MITRAL VALVE ANNULOPLASTY     [facility] 2006    FAMHx:  Family History  Problem Relation Age of Onset  . Hypertension Mother   . Hypertension Sister   . Hypertension Brother   . Colon cancer Other        M side (cousins)  . Diabetes Neg Hx   . Stroke Neg Hx   . CAD Neg Hx   . Prostate cancer Neg Hx   . Rectal cancer Neg Hx   . Esophageal cancer Neg Hx     SOCHx:   reports that he quit smoking about 33 years ago. His smoking use included Cigarettes. He has a 10.00 pack-year smoking history. He has never used smokeless tobacco. He reports that he drinks about 4.2 oz of alcohol per week . He reports that he does not use drugs.  ALLERGIES:  Allergies  Allergen Reactions  . Statins  Other (See Comments)    Other reaction(s): Myalgias (intolerance)    ROS: Pertinent items noted in HPI and remainder of comprehensive ROS otherwise negative.  HOME MEDS: Current Outpatient Prescriptions on File Prior to Visit  Medication Sig Dispense Refill  . ALPRAZolam (XANAX) 0.25 MG tablet Take 1-2 tablets (0.25-0.5 mg total) by mouth 2 (two) times daily as needed for anxiety. 40 tablet 1  . ARGININE PO Take 1,500 mg by mouth daily.     . clopidogrel (PLAVIX) 75 MG tablet Take 1 tablet (75 mg total) by mouth daily. 30 tablet 4  . ezetimibe (ZETIA) 10 MG tablet Take 1 tablet (10 mg total) by mouth daily. (Patient taking differently: Take 10 mg by mouth at bedtime. ) 90  tablet 1  . gabapentin (NEURONTIN) 300 MG capsule Take 1 capsule (300 mg total) by mouth 2 (two) times daily. 60 capsule 6  . hydrochlorothiazide (HYDRODIURIL) 25 MG tablet Take 25 mg by mouth at bedtime.    Marland Kitchen loratadine (CLARITIN) 10 MG tablet Take 10 mg by mouth daily as needed for allergies.    . metoprolol tartrate (LOPRESSOR) 25 MG tablet Take 1 tablet (25 mg total) by mouth 2 (two) times daily. 180 tablet 3  . Misc Natural Products (PROSTATE HEALTH PO) Take 1 tablet by mouth daily.    . Multiple Vitamins-Minerals (OCUVITE PRESERVISION PO) Take 1 tablet by mouth 2 (two) times daily.     . Omega-3 Fatty Acids (FISH OIL PO) Take 1 capsule by mouth daily.     Marland Kitchen omeprazole (PRILOSEC) 40 MG capsule Take 1 capsule (40 mg total) by mouth daily. (Patient taking differently: Take 40 mg by mouth daily as needed (for acid reflux). ) 90 capsule 3  . OVER THE COUNTER MEDICATION Take 2 tablets by mouth daily.    . Red Yeast Rice Extract (RED YEAST RICE PO) Take 2 tablets by mouth daily.     . rosuvastatin (CRESTOR) 20 MG tablet Take 1 tablet (20 mg total) by mouth at bedtime. 30 tablet 3  . UNABLE TO FIND Outpatient physical therapy, occupational therapy, speech therapy  Diagnosis: CVA 1 Mutually Defined 0   No current facility-administered medications on file prior to visit.     LABS/IMAGING: No results found for this or any previous visit (from the past 48 hour(s)). No results found.  LIPID PANEL:    Component Value Date/Time   CHOL 185 09/12/2016 1430   TRIG 94 09/12/2016 1430   HDL 42 09/12/2016 1430   CHOLHDL 4.4 09/12/2016 1430   VLDL 19 09/12/2016 1430   LDLCALC 124 (H) 09/12/2016 1430   LDLDIRECT 149.1 08/30/2012 1352    WEIGHTS: Wt Readings from Last 3 Encounters:  09/30/16 180 lb (81.6 kg)  09/27/16 179 lb 6 oz (81.4 kg)  09/12/16 175 lb (79.4 kg)    VITALS: BP 124/70   Pulse (!) 58   Ht 5\' 10"  (1.778 m)   Wt 180 lb (81.6 kg)   BMI 25.83 kg/m   EXAM: General  appearance: alert and no distress Neck: no carotid bruit, no JVD and thyroid not enlarged, symmetric, no tenderness/mass/nodules Lungs: clear to auscultation bilaterally Heart: regular rate and rhythm, S1, S2 normal, no murmur, click, rub or gallop Abdomen: soft, non-tender; bowel sounds normal; no masses,  no organomegaly Extremities: extremities normal, atraumatic, no cyanosis or edema Pulses: 2+ and symmetric Skin: Skin color, texture, turgor normal. No rashes or lesions Neurologic: Grossly normal Psych: Pleasant  EKG: Sinus bradycardia 58, left  axis deviation- personally reviewed  ASSESSMENT: 1. Cryptogenic stroke 2. Status post mitral valve repair  PLAN: 1.   Kevin Bass had a recent cryptogenic stroke and is very reasonable to consider admitting him a bubble study, but ideally a transesophageal echo to rule out PFO or left atrial appendage thrombus which would require anticoagulation rather than enhanced antiplatelet therapy. After much discussion and better understanding of the procedure, he is willing to proceed and would like me to perform it. We'll try to schedule that early next week. In addition he is agreeable to an implanted loop recorder if the transesophageal echo is unrevealing. I did discuss the risks, benefits and alternatives of transesophageal echocardiography with him today and he is agreeable to proceed.  Plan follow-up with me afterwards. Thanks for allowing me to participate in his care.  Kevin Casino, MD, [place]  Attending Cardiologist  Direct Dial: [phone]  Fax: [phone]  Website:  [email or web address] 09/30/2016, 5:13 PM

## 2016-09-30 NOTE — Patient Instructions (Addendum)
Dr. Debara Pickett has ordered a transesophageal echocardiogram (TEE) - to be scheduled w/him  Dr. Debara Pickett has ordered loop recorder to be done after TEE  Your physician recommends that you schedule a follow-up appointment with Dr. Debara Pickett after your procedure.    Transesophageal Echocardiogram Transesophageal echocardiography (TEE) is a picture test of your heart using sound waves. The pictures taken can give very detailed pictures of your heart. This can help your doctor see if there are problems with your heart. TEE can check:  If your heart has blood clots in it.  How well your heart valves are working.  If you have an infection on the inside of your heart.  Some of the major arteries of your heart.  If your heart valve is working after a Office manager.  Your heart before a procedure that uses a shock to your heart to get the rhythm back to normal.  What happens before the procedure?  Do not eat or drink for 6 hours before the procedure or as told by your doctor.  Make plans to have someone drive you home after the procedure. Do not drive yourself home.  An IV tube will be put in your arm. What happens during the procedure?  You will be given a medicine to help you relax (sedative). It will be given through the IV tube.  A numbing medicine will be sprayed or gargled in the back of your throat to help numb it.  The tip of the probe is placed into the back of your mouth. You will be asked to swallow. This helps to pass the probe into your esophagus.  Once the tip of the probe is in the right place, your doctor can take pictures of your heart.  You may feel pressure at the back of your throat. What happens after the procedure?  You will be taken to a recovery area so the sedative can wear off.  Your throat may be sore and scratchy. This will go away slowly over time.  You will go home when you are fully awake and able to swallow liquids.  You should have someone stay with you for the  next 24 hours.  Do not drive or operate machinery for the next 24 hours. This information is not intended to replace advice given to you by your health care provider. Make sure you discuss any questions you have with your health care provider. Document Released: 12/06/2008 Document Revised: 07/17/2015 Document Reviewed: 08/10/2012 Elsevier Interactive Patient Education  [employer].

## 2016-10-01 ENCOUNTER — Ambulatory Visit (INDEPENDENT_AMBULATORY_CARE_PROVIDER_SITE_OTHER): Payer: PPO | Admitting: Neurology

## 2016-10-01 ENCOUNTER — Encounter: Payer: Self-pay | Admitting: Neurology

## 2016-10-01 VITALS — BP 122/84 | HR 83 | Ht 70.0 in | Wt 181.0 lb

## 2016-10-01 DIAGNOSIS — I639 Cerebral infarction, unspecified: Secondary | ICD-10-CM

## 2016-10-01 DIAGNOSIS — G500 Trigeminal neuralgia: Secondary | ICD-10-CM | POA: Insufficient documentation

## 2016-10-01 NOTE — Progress Notes (Signed)
NEUROLOGY CONSULTATION NOTE  Kevin Bass MRN: [id number] DOB: 23-Jan-1941  Referring provider: Dr. Kathlene November Primary care provider: Dr. Kathlene November  Reason for consult:  stroke  Dear Dr [name]:  Thank you for your kind referral of Kevin Bass for consultation of the above symptoms. Although his history is well known to you, please allow me to reiterate it for the purpose of our medical record. The patient was accompanied to the clinic by his wife who also provides collateral information. Records and images were personally reviewed where available.  HISTORY OF PRESENT ILLNESS: This is a pleasant right-handed man with a history of colon cancer, hyperlipidemia, previous mitral valve repair in his usual state of health until 09/11/16 after taking a walk, he saw his brother-in-law and when he started noticing word-finding difficulties. His wife reported that his speech was not necessarily slurred but he was having trouble finding his words. When symptoms were unchanged the next day, he decided to go to the ER. He was still noted to have mild word-finding difficulties. There was no focal numbness/tingling/weakness. He denied any headaches, dizziness, diplopia, dysarthria/dysphagia. I personally reviewed MRI brain without contrast which showed an acute infarct in the left posterior insula and parietal operculum. MRA head and carotid dopplers did not show any significant stenosis. Echocardiogram showed EF 60-65%, mild LVH, prior MV repair without significant stenosis or regurgitation, normal LA size. He was switched from aspirin to Plavix. LDL was 124, HbA1c was 5.6. He was unable to get a TEE in the hospital, and has seen Dr. Debara Pickett and agreed to having the procedure done on 8/14 followed by loop recorder. He denies any palpitations, chest pain, shortness of breath. No neck/back pain, bowel/bladder dysfunction. No falls. He denies any side effect on Plavix. He was started on Crestor yesterday. He  feels he is 95% or more back to baseline, he denies any confusion or focal symptoms. He reports a history of facial pain on the right upper lip radiating up his cheek with good response to gabapentin. He takes 300mg  qhs with no side effects.  PAST MEDICAL HISTORY: Past Medical History:  Diagnosis Date  . Allergy   . Anxiety   . Arthritis    hands  . Colon cancer [facility])    s/p partial colectomy 1990, Cscope neg 7-09, next 2014  . DISORDER, MITRAL VALVE    MV recontruction at [place] 2006----still needs ABX for SBE prophylaxis   . Elevated PSA    [name]  . GERD (gastroesophageal reflux disease)   . Heart murmur    no problems since surgery  . HYPERGLYCEMIA, BORDERLINE 10/16/2008  . HYPERLIPIDEMIA 06/15/2006   diet controlled  . Hypertension   . HYPERTROPHY PROSTATE W/O UR OBST & OTH LUTS 10/16/2008  . Right groin pain    Dr. Redmond Pulling  . Wears partial dentures    lower partial    PAST SURGICAL HISTORY: Past Surgical History:  Procedure Laterality Date  . COLECTOMY  1990  . COLONOSCOPY    . HERNIA REPAIR Left 2014   [facility] rep w/mesh- March 2014  . MITRAL VALVE ANNULOPLASTY     [facility] 2006    MEDICATIONS: Current Outpatient Prescriptions on File Prior to Visit  Medication Sig Dispense Refill  . ALPRAZolam (XANAX) 0.25 MG tablet Take 1-2 tablets (0.25-0.5 mg total) by mouth 2 (two) times daily as needed for anxiety. 40 tablet 1  . Arginine 500 MG CAPS Take 1,500 mg by mouth daily.    Marland Kitchen aspirin EC  81 MG tablet Take 81 mg by mouth at bedtime.    . clopidogrel (PLAVIX) 75 MG tablet Take 1 tablet (75 mg total) by mouth daily. (Patient taking differently: Take 75 mg by mouth daily at 2 PM. ) 30 tablet 4  . ezetimibe (ZETIA) 10 MG tablet Take 1 tablet (10 mg total) by mouth daily. (Patient taking differently: Take 10 mg by mouth at bedtime. ) 90 tablet 1  . gabapentin (NEURONTIN) 300 MG capsule Take 1 capsule (300 mg total) by mouth 2 (two) times daily. (Patient taking differently: Take 300  mg by mouth at bedtime. ) 60 capsule 6  . Glucosamine-MSM-Hyaluronic Acd (JOINT HEALTH PO) Take 1 tablet by mouth daily. [place]    . hydrochlorothiazide (HYDRODIURIL) 25 MG tablet Take 25 mg by mouth at bedtime.    Marland Kitchen loratadine (CLARITIN) 10 MG tablet Take 10 mg by mouth at bedtime.     Marland Kitchen LYCOPENE PO Take 1 capsule by mouth daily. JARROW FORMULA LYCO-SORB PROSTATE SUPPORT    . metoprolol tartrate (LOPRESSOR) 25 MG tablet Take 1 tablet (25 mg total) by mouth 2 (two) times daily. (Patient taking differently: Take 25 mg by mouth daily. ) 180 tablet 3  . Multiple Vitamins-Minerals (OCUVITE PRESERVISION PO) Take 1 tablet by mouth 2 (two) times daily.     . Omega-3 Fatty Acids (FISH OIL PO) Take 1,600 mg by mouth daily.     Marland Kitchen omeprazole (PRILOSEC) 40 MG capsule Take 1 capsule (40 mg total) by mouth daily. (Patient taking differently: Take 40 mg by mouth daily as needed (for acid reflux). ) 90 capsule 3  . Red Yeast Rice Extract (RED YEAST RICE PO) Take 1 tablet by mouth 2 (two) times daily. MORNING & AFTERNOON    . rosuvastatin (CRESTOR) 20 MG tablet Take 1 tablet (20 mg total) by mouth at bedtime. 30 tablet 3  . UNABLE TO FIND Outpatient physical therapy, occupational therapy, speech therapy  Diagnosis: CVA 1 Mutually Defined 0   No current facility-administered medications on file prior to visit.     ALLERGIES: Allergies  Allergen Reactions  . Statins Other (See Comments)    Other reaction(s): Myalgias (intolerance)    FAMILY HISTORY: Family History  Problem Relation Age of Onset  . Colon cancer Other        M side (cousins)  . Diabetes Neg Hx   . Stroke Neg Hx   . CAD Neg Hx   . Prostate cancer Neg Hx   . Rectal cancer Neg Hx   . Esophageal cancer Neg Hx     SOCIAL HISTORY: Social History   Social History  . Marital status: Married    Spouse name: suzanne  . Number of children: 2  . Years of education: college   Occupational History  . retired     Social  History Main Topics  . Smoking status: Former Smoker    Packs/day: 0.50    Years: 20.00    Types: Cigarettes    Quit date: 02/23/1983  . Smokeless tobacco: Never Used  . Alcohol use 4.2 oz/week    7 Glasses of wine per week  . Drug use: No  . Sexual activity: Not on file   Other Topics Concern  . Not on file   Social History Narrative   From [place], household pt and wife   Daughter in [place], son in [place]: Constitutional: No fevers, chills, or  sweats, no generalized fatigue, change in appetite Eyes: No visual changes, double vision, eye pain Ear, nose and throat: No hearing loss, ear pain, nasal congestion, sore throat Cardiovascular: No chest pain, palpitations Respiratory:  No shortness of breath at rest or with exertion, wheezes GastrointestinaI: No nausea, vomiting, diarrhea, abdominal pain, fecal incontinence Genitourinary:  No dysuria, urinary retention or frequency Musculoskeletal:  No neck pain, back pain Integumentary: No rash, pruritus, skin lesions Neurological: as above Psychiatric: No depression, insomnia, anxiety Endocrine: No palpitations, fatigue, diaphoresis, mood swings, change in appetite, change in weight, increased thirst Hematologic/Lymphatic:  No anemia, purpura, petechiae. Allergic/Immunologic: no itchy/runny eyes, nasal congestion, recent allergic reactions, rashes  PHYSICAL EXAM: Vitals:   10/01/16 1122  BP: 122/84  Pulse: 83  SpO2: 97%   General: No acute distress Head:  Normocephalic/atraumatic Eyes: Fundoscopic exam shows bilateral sharp discs, no vessel changes, exudates, or hemorrhages Neck: supple, no paraspinal tenderness, full range of motion Back: No paraspinal tenderness Heart: regular rate and rhythm Lungs: Clear to auscultation bilaterally. Vascular: No carotid bruits. Skin/Extremities: No rash, no edema Neurological Exam: Mental status: alert and oriented to person, place, and time, no dysarthria or  aphasia, no significant word-finding difficulties noted today. Fund of knowledge is appropriate.  Remote memory intact. 1/3 delayed recall. Attention and concentration are normal, missed 1 letter spelling WORLD backward.  Able to name objects and repeat phrases, able to read. Cranial nerves: CN I: not tested CN II: pupils equal, round and reactive to light, visual fields intact, fundi unremarkable. CN III, IV, VI:  full range of motion, no nystagmus, no ptosis CN V: facial sensation intact CN VII: upper and lower face symmetric CN VIII: hearing intact to finger rub CN IX, X: gag intact, uvula midline CN XI: sternocleidomastoid and trapezius muscles intact CN XII: tongue midline Bulk & Tone: normal, no fasciculations. Motor: 5/5 throughout with no pronator drift. Sensation: intact to light touch, cold, pin, vibration and joint position sense.  No extinction to double simultaneous stimulation.  Romberg test negative Deep Tendon Reflexes: +2 throughout, no ankle clonus Plantar responses: downgoing bilaterally Cerebellar: no incoordination on finger to nose, heel to shin. No dysdiadochokinesia Gait: narrow-based and steady, able to tandem walk adequately. Tremor: none  IMPRESSION: This is a pleasant 76  year old right-handed man with a history of colon cancer, hyperlipidemia, previous mitral valve repair in his usual state of health until 09/11/16 when he started having mild expressive aphasia. MRI brain showed an acute infarct in the left posterior insula and parietal operculum, likely embolic, of undetermined etiology. MRA head, carotid dopplers, and TTE unremarkable. LDL 124, now on Crestor, goal <70. He is scheduled for TEE and loop recorder. Continue Plavix and control of vascular risk factors. He feels he is nearly back to baseline, neurological exam today normal. We discussed indications for planned tests, diagnosis, and prognosis. He knows to go to the ER immediately for any change in  symptoms. He also reports a history of recurrent facial pain suggestive of trigeminal neuralgia with good response to gabapentin, continue current dose. He will follow-up in 6 months and knows to call for any changes.   Thank you for allowing me to participate in the care of this patient. Please do not hesitate to call for any questions or concerns.   Ellouise Newer, M.D.  CC: Dr. [name]

## 2016-10-01 NOTE — Patient Instructions (Signed)
1. Continue control of blood pressure, cholesterol, daily Plavix.  2. Proceed with TEE and loop recorder as scheduled 3. If any change in symptoms, go to ER immediately 4. Continue gabapentin as prescribed 5. Follow-up in 6 months, call for any changes

## 2016-10-05 ENCOUNTER — Ambulatory Visit: Payer: PPO | Admitting: Internal Medicine

## 2016-10-05 ENCOUNTER — Encounter (HOSPITAL_COMMUNITY): Payer: Self-pay | Admitting: *Deleted

## 2016-10-05 ENCOUNTER — Ambulatory Visit (HOSPITAL_COMMUNITY)
Admission: RE | Admit: 2016-10-05 | Discharge: 2016-10-05 | Disposition: A | Discharge status: Home / Self Care | Payer: PPO | Source: Ambulatory Visit | Attending: Internal Medicine | Admitting: Internal Medicine

## 2016-10-05 ENCOUNTER — Encounter (HOSPITAL_COMMUNITY)
Admission: RE | Disposition: A | Discharge status: Home / Self Care | Payer: Self-pay | Source: Ambulatory Visit | Attending: Internal Medicine

## 2016-10-05 ENCOUNTER — Ambulatory Visit (HOSPITAL_BASED_OUTPATIENT_CLINIC_OR_DEPARTMENT_OTHER)
Admission: RE | Admit: 2016-10-05 | Discharge: 2016-10-05 | Disposition: A | Discharge status: Home / Self Care | Payer: PPO | Source: Ambulatory Visit | Attending: Internal Medicine | Admitting: Internal Medicine

## 2016-10-05 DIAGNOSIS — Q208 Other congenital malformations of cardiac chambers and connections: Secondary | ICD-10-CM

## 2016-10-05 DIAGNOSIS — K219 Gastro-esophageal reflux disease without esophagitis: Secondary | ICD-10-CM | POA: Insufficient documentation

## 2016-10-05 DIAGNOSIS — N400 Enlarged prostate without lower urinary tract symptoms: Secondary | ICD-10-CM | POA: Diagnosis not present

## 2016-10-05 DIAGNOSIS — F419 Anxiety disorder, unspecified: Secondary | ICD-10-CM | POA: Diagnosis not present

## 2016-10-05 DIAGNOSIS — M19042 Primary osteoarthritis, left hand: Secondary | ICD-10-CM | POA: Insufficient documentation

## 2016-10-05 DIAGNOSIS — Z87891 Personal history of nicotine dependence: Secondary | ICD-10-CM | POA: Diagnosis not present

## 2016-10-05 DIAGNOSIS — I638 Other cerebral infarction: Secondary | ICD-10-CM

## 2016-10-05 DIAGNOSIS — M19041 Primary osteoarthritis, right hand: Secondary | ICD-10-CM | POA: Insufficient documentation

## 2016-10-05 DIAGNOSIS — Z85038 Personal history of other malignant neoplasm of large intestine: Secondary | ICD-10-CM | POA: Insufficient documentation

## 2016-10-05 DIAGNOSIS — E785 Hyperlipidemia, unspecified: Secondary | ICD-10-CM | POA: Diagnosis not present

## 2016-10-05 DIAGNOSIS — I629 Nontraumatic intracranial hemorrhage, unspecified: Secondary | ICD-10-CM | POA: Diagnosis present

## 2016-10-05 DIAGNOSIS — Z9049 Acquired absence of other specified parts of digestive tract: Secondary | ICD-10-CM | POA: Insufficient documentation

## 2016-10-05 DIAGNOSIS — Z7982 Long term (current) use of aspirin: Secondary | ICD-10-CM | POA: Insufficient documentation

## 2016-10-05 DIAGNOSIS — Z7902 Long term (current) use of antithrombotics/antiplatelets: Secondary | ICD-10-CM | POA: Insufficient documentation

## 2016-10-05 DIAGNOSIS — I639 Cerebral infarction, unspecified: Secondary | ICD-10-CM

## 2016-10-05 DIAGNOSIS — I10 Essential (primary) hypertension: Secondary | ICD-10-CM | POA: Insufficient documentation

## 2016-10-05 HISTORY — PX: TEE WITHOUT CARDIOVERSION: SHX5443

## 2016-10-05 SURGERY — ECHOCARDIOGRAM, TRANSESOPHAGEAL
Anesthesia: Moderate Sedation

## 2016-10-05 SURGERY — LOOP RECORDER INSERTION

## 2016-10-05 MED ORDER — FENTANYL CITRATE (PF) 100 MCG/2ML IJ SOLN
INTRAMUSCULAR | Status: DC | PRN
  Administered 2016-10-05 (×2): 25 ug via INTRAVENOUS

## 2016-10-05 MED ORDER — LIDOCAINE VISCOUS 2 % MT SOLN
OROMUCOSAL | Status: DC | PRN
  Administered 2016-10-05: 10 mL via OROMUCOSAL

## 2016-10-05 MED ORDER — MIDAZOLAM HCL 10 MG/2ML IJ SOLN
INTRAMUSCULAR | Status: DC | PRN
  Administered 2016-10-05 (×2): 2 mg via INTRAVENOUS

## 2016-10-05 MED ORDER — LIDOCAINE VISCOUS 2 % MT SOLN
OROMUCOSAL | Status: AC
  Filled 2016-10-05: qty 15

## 2016-10-05 MED ORDER — SODIUM CHLORIDE 0.9 % IV SOLN
INTRAVENOUS | Status: DC
  Administered 2016-10-05: 09:00:00 via INTRAVENOUS

## 2016-10-05 MED ORDER — MIDAZOLAM HCL 5 MG/ML IJ SOLN
INTRAMUSCULAR | Status: AC
  Filled 2016-10-05: qty 2

## 2016-10-05 MED ORDER — FENTANYL CITRATE (PF) 100 MCG/2ML IJ SOLN
INTRAMUSCULAR | Status: AC
  Filled 2016-10-05: qty 2

## 2016-10-05 NOTE — Consult Note (Signed)
ELECTROPHYSIOLOGY CONSULT NOTE  Patient ID: Kevin Bass MRN: [id number], DOB/AGE: October 03, 1940   Admit date: 10/05/2016 Date of Consult: 10/05/2016  Primary Physician: Colon Branch, MD Primary Cardiologist: Dr. Hilty/[facility], Dr. Donley Redder, [facility] Reason for Consultation: Cryptogenic stroke ; EP consulted for recommendations regarding Implantable Loop Recorder, initially by neurology service Dr. Leonie Man, more recently r. Hilty  History of Present Illness Kevin Bass was admitted on 09/12/16 with stroke, presented with expressive aphasia. Imaging demonstrated acute infarct left posterior insula and parietal operculum felt to be embolic 2/2 unknown source.  He underwent stroke w/u recommended to have TEE/ILR consultation, though unable to complete TEE 2/2 equipment issues and ultimately preferred to f/u with his cardiologist for guidance declining TEE/ILR at that hospital stay, discharged 09/15/16.    PMHx includes MV repair remotely, HTN, HLD, colon cancer treated surgically.  He saw Dr. Debara Pickett out patient for another opinion, noting he had apparently discussed with his cardiologist and decided to seek yet another opinion.  After his consultation with Dr. Debara Pickett the patient did become comfortable/agreeable to TEE and loop implant for which he comes today.  Echocardiogram 09/13/16 Study Conclusions - Left ventricle: The cavity size was normal. Wall thickness was   increased in a pattern of mild LVH. Systolic function was normal.   The estimated ejection fraction was in the range of 60% to 65%.   Wall motion was normal; there were no regional wall motion   abnormalities. Doppler parameters are consistent with abnormal   left ventricular relaxation (grade 1 diastolic dysfunction). The   E/e&' ratio is between 8-15, suggesting indeterminate LV filling   pressure. - Mitral valve: s/p repair. No significant stenosis or   regurgitation. - Left atrium: The atrium was normal in size. - Right  atrium: The atrium was at the upper limits of normal in   size. - Tricuspid valve: There was trivial regurgitation. - Pulmonary arteries: PA peak pressure: 19 mm Hg (S). - Inferior vena cava: The vessel was normal in size. The   respirophasic diameter changes were in the normal range (>= 50%),   consistent with normal central venous pressure. Impressions: - LVEF 60-65%, mild LVH, normal wall motion, grade 1 DD,   indeterminate LV filling pressure, prior MV repair without   significant stenosis or regurgitation, normal LA size, upper   normal RA size, trivial TR, RVSP 19 mmHg, normal IVC.  Lab work 09/27/16 is reviewed.  Prior to admission, the patient denies chest pain, shortness of breath, dizziness, palpitations, or syncope.        Past Medical History:  Diagnosis Date  . Allergy   . Anxiety   . Arthritis    hands  . Colon cancer [facility])    s/p partial colectomy 1990, Cscope neg 7-09, next 2014  . DISORDER, MITRAL VALVE    MV recontruction at [place] 2006----still needs ABX for SBE prophylaxis   . Elevated PSA    [name]  . GERD (gastroesophageal reflux disease)   . Heart murmur    no problems since surgery  . HYPERGLYCEMIA, BORDERLINE 10/16/2008  . HYPERLIPIDEMIA 06/15/2006   diet controlled  . Hypertension   . HYPERTROPHY PROSTATE W/O UR OBST & OTH LUTS 10/16/2008  . Right groin pain    Dr. Redmond Pulling  . Wears partial dentures    lower partial     Surgical History:  Past Surgical History:  Procedure Laterality Date  . COLECTOMY  1990  . COLONOSCOPY    . HERNIA REPAIR Left  2014   [facility] rep w/mesh- March 2014  . MITRAL VALVE ANNULOPLASTY     [facility] 2006     Prescriptions Prior to Admission  Medication Sig Dispense Refill Last Dose  . ALPRAZolam (XANAX) 0.25 MG tablet Take 1-2 tablets (0.25-0.5 mg total) by mouth 2 (two) times daily as needed for anxiety. 40 tablet 1 Past Week at Unknown time  . Arginine 500 MG CAPS Take 1,500 mg by mouth daily.   10/04/2016 at Unknown time   . aspirin EC 81 MG tablet Take 81 mg by mouth at bedtime.   10/04/2016 at Unknown time  . clopidogrel (PLAVIX) 75 MG tablet Take 1 tablet (75 mg total) by mouth daily. (Patient taking differently: Take 75 mg by mouth daily at 2 PM. ) 30 tablet 4 10/04/2016 at Unknown time  . ezetimibe (ZETIA) 10 MG tablet Take 1 tablet (10 mg total) by mouth daily. (Patient taking differently: Take 10 mg by mouth at bedtime. ) 90 tablet 1 10/04/2016 at Unknown time  . gabapentin (NEURONTIN) 300 MG capsule Take 1 capsule (300 mg total) by mouth 2 (two) times daily. (Patient taking differently: Take 300 mg by mouth at bedtime. ) 60 capsule 6 10/04/2016 at Unknown time  . Glucosamine-MSM-Hyaluronic Acd (JOINT HEALTH PO) Take 1 tablet by mouth daily. [place] BUILDER   10/04/2016 at Unknown time  . hydrochlorothiazide (HYDRODIURIL) 25 MG tablet Take 25 mg by mouth at bedtime.   10/04/2016 at Unknown time  . loratadine (CLARITIN) 10 MG tablet Take 10 mg by mouth at bedtime.    10/04/2016 at Unknown time  . LYCOPENE PO Take 1 capsule by mouth daily. JARROW FORMULA LYCO-SORB PROSTATE SUPPORT   10/04/2016 at Unknown time  . metoprolol tartrate (LOPRESSOR) 25 MG tablet Take 1 tablet (25 mg total) by mouth 2 (two) times daily. (Patient taking differently: Take 25 mg by mouth daily. ) 180 tablet 3 10/05/2016 at 0730  . Multiple Vitamins-Minerals (OCUVITE PRESERVISION PO) Take 1 tablet by mouth 2 (two) times daily.    10/04/2016 at Unknown time  . Omega-3 Fatty Acids (FISH OIL PO) Take 1,600 mg by mouth daily.    10/04/2016 at Unknown time  . omeprazole (PRILOSEC) 40 MG capsule Take 1 capsule (40 mg total) by mouth daily. (Patient taking differently: Take 40 mg by mouth daily as needed (for acid reflux). ) 90 capsule 3 Past Week at Unknown time  . Red Yeast Rice Extract (RED YEAST RICE PO) Take 1 tablet by mouth 2 (two) times daily. MORNING & AFTERNOON   10/04/2016 at Unknown time  . rosuvastatin (CRESTOR) 20 MG tablet Take 1 tablet (20  mg total) by mouth at bedtime. 30 tablet 3 10/04/2016 at Unknown time  . UNABLE TO FIND Outpatient physical therapy, occupational therapy, speech therapy  Diagnosis: CVA 1 Mutually Defined 0 Past Week at Unknown time    Inpatient Medications:   Allergies:  Allergies  Allergen Reactions  . Statins Other (See Comments)    Other reaction(s): Myalgias (intolerance)    Social History   Social History  . Marital status: Married    Spouse name: suzanne  . Number of children: 2  . Years of education: college   Occupational History  . retired     Social History Main Topics  . Smoking status: Former Smoker    Packs/day: 0.50    Years: 20.00    Types: Cigarettes    Quit date: 02/23/1983  . Smokeless tobacco: Never Used  . Alcohol  use 4.2 oz/week    7 Glasses of wine per week  . Drug use: No  . Sexual activity: Not on file   Other Topics Concern  . Not on file   Social History Narrative   From [place], household pt and wife   Daughter in [place], son in [place]           Family History  Problem Relation Age of Onset  . Colon cancer Other        M side (cousins)  . Diabetes Neg Hx   . Stroke Neg Hx   . CAD Neg Hx   . Prostate cancer Neg Hx   . Rectal cancer Neg Hx   . Esophageal cancer Neg Hx       Review of Systems: All other systems reviewed and are otherwise negative except as noted above.  Physical Exam: There were no vitals filed for this visit.  GEN- The patient is well appearing, alert and oriented x 3 today.   Head- normocephalic, atraumatic Eyes-  Sclera clear, conjunctiva pink Ears- hearing intact Oropharynx- clear Neck- supple Lungs- CTA b/l, normal work of breathing Heart- RRR, no murmurs, rubs or gallops  GI- soft, NT, ND Extremities- no clubbing, cyanosis, or edema MS- no significant deformity or atrophy Skin- no rash or lesion Psych- euthymic mood, full affect   Labs:   Lab Results  Component Value Date   WBC 6.4 09/27/2016   HGB 15.1  09/27/2016   HCT 45.2 09/27/2016   MCV 86.3 09/27/2016   PLT 275 09/27/2016   No results for input(s): NA, K, CL, CO2, BUN, CREATININE, CALCIUM, PROT, BILITOT, ALKPHOS, ALT, AST, GLUCOSE in the last 168 hours.  Invalid input(s): LABALBU Lab Results  Component Value Date   CKTOTAL 101 04/21/2010   Lab Results  Component Value Date   CHOL 185 09/12/2016   CHOL 190 08/03/2016   CHOL 232 (H) 04/08/2016   Lab Results  Component Value Date   HDL 42 09/12/2016   HDL 36.30 (L) 08/03/2016   HDL 49.90 04/08/2016   Lab Results  Component Value Date   LDLCALC 124 (H) 09/12/2016   LDLCALC 124 (H) 08/03/2016   LDLCALC 164 (H) 04/08/2016   Lab Results  Component Value Date   TRIG 94 09/12/2016   TRIG 147.0 08/03/2016   TRIG 92.0 04/08/2016   Lab Results  Component Value Date   CHOLHDL 4.4 09/12/2016   CHOLHDL 5 08/03/2016   CHOLHDL 5 04/08/2016   Lab Results  Component Value Date   LDLDIRECT 149.1 08/30/2012   LDLDIRECT 147.3 10/30/2010   LDLDIRECT 171.3 07/24/2010    Lab Results  Component Value Date   DDIMER 0.93 (H) 06/07/2014     Radiology/Studies:   12-lead ECG  All prior EKG's in EPIC reviewed with no documented atrial fibrillation  Telemetry out patient for TEE, at prior hospital stay and initial consultation, reported on telemetry to have been observed to have SR only  Assessment and Plan:  1. Cryptogenic stroke The patient presents with cryptogenic stroke.  The patient has a TEE planned for this AM.  I spoke at length with the patient about monitoring for afib with either a 30 day event monitor or an implantable loop recorder.  Risks, benefits, and alteratives to implantable loop recorder were discussed with the patient today.   At this time, the patient is very clear in his decision to proceed with implantable loop recorder.   Wound care was reviewed with the patient (  keep incision clean and dry for 3 days).  Wound check will be scheduled for the  patient.  Please call with questions.   Renee Dyane Dustman, PA-C 10/05/2016  Pt not seen prior to his going home Discussed with RU and Dr Debara Pickett, the closure of his LAA does not suffice to assure complete closure  In this regard the TEE evaluation may be helpful

## 2016-10-05 NOTE — H&P (Signed)
   INTERVAL PROCEDURE H&P  History and Physical Interval Note:  10/05/2016 9:55 AM  Pauline Aus has presented today for their planned procedure. The various methods of treatment have been discussed with the patient and family. After consideration of risks, benefits and other options for treatment, the patient has consented to the procedure.  The patients' outpatient history has been reviewed, patient examined, and no change in status from most recent office note within the past 30 days. I have reviewed the patients' chart and labs and will proceed as planned. Questions were answered to the patient's satisfaction.   [name], MD, [place]  Attending Cardiologist  Direct Dial: [phone]  Fax: [phone]  Website:  [email or web address] Cheryn Lundquist 10/05/2016, 9:55 AM

## 2016-10-05 NOTE — Discharge Instructions (Signed)
Transesophageal Echocardiogram °Transesophageal echocardiography (TEE) is a picture test of your heart using sound waves. The pictures taken can give very detailed pictures of your heart. This can help your doctor see if there are problems with your heart. TEE can check: °· If your heart has blood clots in it. °· How well your heart valves are working. °· If you have an infection on the inside of your heart. °· Some of the major arteries of your heart. °· If your heart valve is working after a repair. °· Your heart before a procedure that uses a shock to your heart to get the rhythm back to normal. ° °What happens before the procedure? °· Do not eat or drink for 6 hours before the procedure or as told by your doctor. °· Make plans to have someone drive you home after the procedure. Do not drive yourself home. °· An IV tube will be put in your arm. °What happens during the procedure? °· You will be given a medicine to help you relax (sedative). It will be given through the IV tube. °· A numbing medicine will be sprayed or gargled in the back of your throat to help numb it. °· The tip of the probe is placed into the back of your mouth. You will be asked to swallow. This helps to pass the probe into your esophagus. °· Once the tip of the probe is in the right place, your doctor can take pictures of your heart. °· You may feel pressure at the back of your throat. °What happens after the procedure? °· You will be taken to a recovery area so the sedative can wear off. °· Your throat may be sore and scratchy. This will go away slowly over time. °· You will go home when you are fully awake and able to swallow liquids. °· You should have someone stay with you for the next 24 hours. °· Do not drive or operate machinery for the next 24 hours. °This information is not intended to replace advice given to you by your health care provider. Make sure you discuss any questions you have with your health care provider. °Document  Released: 12/06/2008 Document Revised: 07/17/2015 Document Reviewed: 08/10/2012 °Elsevier Interactive Patient Education © 2018 [employer] Inc. ° °

## 2016-10-05 NOTE — CV Procedure (Signed)
TRANSESOPHAGEAL ECHOCARDIOGRAM (TEE) NOTE  INDICATIONS: crptogenic stroke, prior mitral valve repair  PROCEDURE:   Informed consent was obtained prior to the procedure. The risks, benefits and alternatives for the procedure were discussed and the patient comprehended these risks.  Risks include, but are not limited to, cough, sore throat, vomiting, nausea, somnolence, esophageal and stomach trauma or perforation, bleeding, low blood pressure, aspiration, pneumonia, infection, trauma to the teeth and death.    After a procedural time-out, the patient was given 4 mg versed and 50 mcg fentanyl for moderate sedation.  The patient's heart rate, blood pressure, and oxygen saturation are monitored continuously during the procedure.The oropharynx was anesthetized 10 cc of topical 1% viscous lidocaine and 2 cetacaine sprays.  The transesophageal probe was inserted in the esophagus and stomach without difficulty and multiple views were obtained.  The patient was kept under observation until the patient left the procedure room.  The period of conscious sedation is 15 minutes, of which I was present face-to-face 100% of this time. The patient left the procedure room in stable condition.   Agitated microbubble saline contrast was administered.  COMPLICATIONS:    There were no immediate complications.  Findings:  1. LEFT VENTRICLE: The left ventricular wall thickness is mildly increased.  The left ventricular cavity is normal in size. Wall motion is normal.  LVEF is 60-65%.  2. RIGHT VENTRICLE:  The right ventricle is normal in structure and function without any thrombus or masses.    3. LEFT ATRIUM:  The left atrium is normal in size without any thrombus or masses.  There is not spontaneous echo contrast ("smoke") in the left atrium consistent with a low flow state.  4. LEFT ATRIAL APPENDAGE:  The left atrial appendage is surgically ligated. No flow is noted by color doppler in the appendage.    5. ATRIAL SEPTUM:  The atrial septum appears intact and is free of thrombus and/or masses.  There is no evidence for interatrial shunting by color doppler and saline microbubble.  6. RIGHT ATRIUM:  The right atrium is normal in size and function without any thrombus or masses.  7. MITRAL VALVE:  The mitral valve is noted to have an annuloplasty ring in place. There is trivial regurgitation.  There were no vegetations or stenosis.  8. AORTIC VALVE:  The aortic valve is trileaflet, normal in structure and function with trivial regurgitation.  There were no vegetations or stenosis  9. TRICUSPID VALVE:  The tricuspid valve is normal in structure and function with trivial regurgitation.  There were no vegetations or stenosis  10.  PULMONIC VALVE:  The pulmonic valve is normal in structure and function with trivial regurgitation.  There were no vegetations or stenosis.   11. AORTIC ARCH, ASCENDING AND DESCENDING AORTA:  There was grade 1 Ron Parker et. Al, 1992) atherosclerosis of the ascending aorta, aortic arch, or proximal descending aorta.  12. PULMONARY VEINS: Anomalous pulmonary venous return was not noted.  13. PERICARDIUM: The pericardium appeared normal and non-thickened.  There is no pericardial effusion.  IMPRESSION:   1. Surgically ligated LAA without flow detectable by color doppler 2. Negative for PFO 3. Stable mitral valve annuloplasty ring and repair with trivial MR 4. LVEF 60-65% with normal wall motion  RECOMMENDATIONS:    1. No obvious cardiac source of thrombus. Cased discussed with Dr. Caryl Comes of cardiac EP - despite adequate closure of the LAA, there may still be residual risk of thrombus formation in the setting of a-fib. He  would recommend a loop recorder. I discussed this with the patient and he would like further study data to support this and declined loop recorder placement at this time. I will plan to follow-up with him in the office to discuss this further. For now, I  advised him to continue Plavix.  Time Spent Directly with the Patient:  35 minutes   [name], MD, [place]  Attending Cardiologist  Direct Dial: [phone]  Fax: [phone]  Website:  [email or web address] 10/05/2016, 11:16 AM

## 2016-10-05 NOTE — Progress Notes (Signed)
  Echocardiogram 2D Echocardiogram has been performed.  Rickesha Veracruz T Earlyne Feeser 10/05/2016, 11:09 AM

## 2016-10-06 ENCOUNTER — Encounter (HOSPITAL_COMMUNITY): Payer: Self-pay | Admitting: Internal Medicine

## 2016-10-21 ENCOUNTER — Encounter: Payer: Self-pay | Admitting: Internal Medicine

## 2016-10-21 ENCOUNTER — Ambulatory Visit: Payer: PPO | Attending: Internal Medicine | Admitting: Physical Therapy

## 2016-10-21 DIAGNOSIS — I639 Cerebral infarction, unspecified: Secondary | ICD-10-CM | POA: Insufficient documentation

## 2016-10-21 DIAGNOSIS — L84 Corns and callosities: Secondary | ICD-10-CM

## 2016-10-21 NOTE — Therapy (Signed)
[facility] [address]  [place], [place], [address] Phone: [phone]   Fax:  [phone]  Physical Therapy Evaluation  Patient Details  Name: Kevin Bass MRN: [id number] Date of Birth: [birth year] Referring Provider: Kathlene November, MD  Encounter Date: 10/21/2016      PT End of Session - 10/21/16 1034    Visit Number 1   PT Start Time 1010   PT Stop Time 1034   PT Time Calculation (min) 24 min   Activity Tolerance Patient tolerated treatment well   Behavior During Therapy [facility] for tasks assessed/performed      Past Medical History:  Diagnosis Date  . Allergy   . Anxiety   . Arthritis    hands  . Colon cancer [facility])    s/p partial colectomy 1990, Cscope neg 7-09, next 2014  . DISORDER, MITRAL VALVE    MV recontruction at [place] 2006----still needs ABX for SBE prophylaxis   . Elevated PSA    [name]  . GERD (gastroesophageal reflux disease)   . Heart murmur    no problems since surgery  . HYPERGLYCEMIA, BORDERLINE 10/16/2008  . HYPERLIPIDEMIA 06/15/2006   diet controlled  . Hypertension   . HYPERTROPHY PROSTATE W/O UR OBST & OTH LUTS 10/16/2008  . Right groin pain    Dr. Redmond Pulling  . Wears partial dentures    lower partial    Past Surgical History:  Procedure Laterality Date  . COLECTOMY  1990  . COLONOSCOPY    . HERNIA REPAIR Left 2014   [facility] rep w/mesh- March 2014  . MITRAL VALVE ANNULOPLASTY     [facility] 2006  . TEE WITHOUT CARDIOVERSION N/A 10/05/2016   Procedure: TRANSESOPHAGEAL ECHOCARDIOGRAM (TEE);  Surgeon: [name], MD;  Location: [facility] ENDOSCOPY;  Service: Cardiovascular;  Laterality: N/A;    There were no vitals filed for this visit.       Subjective Assessment - 10/21/16 1012    Subjective Pt reports he had a stroke a little over a month ago (~09/12/16). Feels like he has minimal strength deficits, but note more speech issues. Prior to CVA, was brisk walking a few miles per day and  has been able to resume this. Biggest concern is speech & cognitive deficits.   Pertinent History Acute cerebral infarct 09/12/16   Diagnostic tests 09/12/16 - MRI of brain: Acute infarct in the left posterior insula and parietal operculum.   Patient Stated Goals "to work on speech and word finding"   Currently in Pain? No/denies            [facility] PT Assessment - 10/21/16 1010      Assessment   Medical Diagnosis CVA   Referring Provider Kathlene November, MD   Onset Date/Surgical Date 09/12/16   Next MD Visit 11/10/16     Balance Screen   Has the patient fallen in the past 6 months No   Has the patient had a decrease in activity level because of a fear of falling?  No   Is the patient reluctant to leave their home because of a fear of falling?  No     Prior Function   Level of Independence Independent   Vocation Retired   Leisure walking several miles per day     Observation/Other Assessments   Focus on Therapeutic Outcomes (FOTO)  Neuro - 92% (8% limitation)     Coordination   Gross Motor Movements are Fluid and Coordinated Yes  Fine Motor Movements are Fluid and Coordinated Yes   Heel Shin Test normal     Functional Tests   Functional tests Single leg stance     Single Leg Stance   Comments 10.75"     ROM / Strength   AROM / PROM / Strength Strength     Strength   Overall Strength Within functional limits for tasks performed   Overall Strength Comments 4+/5 to 5/5 throughout B UE & LE     Ambulation/Gait   Gait Pattern Within Functional Limits   Gait velocity 4.51 ft/sec     Balance   Balance Assessed Yes     Standardized Balance Assessment   Standardized Balance Assessment 10 meter walk test   10 Meter Walk 7.28"     Functional Gait  Assessment   Gait assessed  Yes   Gait Level Surface Walks 20 ft in less than 5.5 sec, no assistive devices, good speed, no evidence for imbalance, normal gait pattern, deviates no more than 6 in outside of the 12 in walkway width.    Change in Gait Speed Able to smoothly change walking speed without loss of balance or gait deviation. Deviate no more than 6 in outside of the 12 in walkway width.   Gait with Horizontal Head Turns Performs head turns smoothly with no change in gait. Deviates no more than 6 in outside 12 in walkway width   Gait with Vertical Head Turns Performs head turns with no change in gait. Deviates no more than 6 in outside 12 in walkway width.   Gait and Pivot Turn Pivot turns safely within 3 sec and stops quickly with no loss of balance.   Step Over Obstacle Is able to step over 2 stacked shoe boxes taped together (9 in total height) without changing gait speed. No evidence of imbalance.   Gait with Narrow Base of Support Is able to ambulate for 10 steps heel to toe with no staggering.   Gait with Eyes Closed Walks 20 ft, no assistive devices, good speed, no evidence of imbalance, normal gait pattern, deviates no more than 6 in outside 12 in walkway width. Ambulates 20 ft in less than 7 sec.   Ambulating Backwards Walks 20 ft, no assistive devices, good speed, no evidence for imbalance, normal gait   Steps Alternating feet, no rail.   Total Score 30            Objective measurements completed on examination: See above findings.                               Plan - 10/21/16 1043    Clinical Impression Statement Lord is a 76 y/o male referred to OP PT following acute CVA on 09/12/16. Pt reports no concerns with strength or balance/coordination, but still noting speech and language deficits with word finding issues and ability to get words out. Assessment revealed B UE & LE strength grossly 5/5. Coordination and balance intact with no deficits noted. No skilled PT or OT indicated at this time, but pt would benefit from SLP eval and treat referral.    History and Personal Factors relevant to plan of care: medical history significant for hypertension, hyperlipidemia, GERD  mitral valve reconstruction 2006, colon cancer status post partial colectomy 1990   Clinical Presentation Stable   Clinical Presentation due to: no acute PT deficits   Clinical Decision Making Low   PT Frequency One  time visit   PT Next Visit Plan recommend SLP referral   Consulted and Agree with Plan of Care Patient      Patient will benefit from skilled therapeutic intervention in order to improve the following deficits and impairments:     Visit Diagnosis: Cerebrovascular accident (CVA), unspecified mechanism ([place])      G-Codes - November 11, 2016 1052    Functional Assessment Tool Used (Outpatient Only) Neuro FOTO = 92% (8% limitation)   Functional Limitation Other PT primary   Other PT Primary Current Status (V7482) At least 1 percent but less than 20 percent impaired, limited or restricted   Other PT Primary Goal Status (L0786) At least 1 percent but less than 20 percent impaired, limited or restricted   Other PT Primary Discharge Status (L5449) At least 1 percent but less than 20 percent impaired, limited or restricted       Problem List Patient Active Problem List   Diagnosis Date Noted  . Trigeminal neuralgia of right side of face 10/01/2016  . History of mitral valve repair 09/30/2016  . Stroke ([place]) 09/12/2016  . GERD (gastroesophageal reflux disease) 09/22/2015  . PCP NOTES >>>>> 11/08/2014  . Chest pain 06/07/2014  . Facial neuropathy 04/02/2013  . Anxiety 02/26/2013  . Annual physical exam 08/30/2012  . HTN (hypertension) 01/22/2011  . HYPERTROPHY PROSTATE W/O UR OBST & OTH LUTS 10/16/2008  . Hyperglycemia 10/16/2008  . Hyperlipidemia 06/15/2006  . DISORDER, MITRAL VALVE 06/15/2006  . ALLERGIC RHINITIS 06/15/2006  . COLON CANCER, HX OF 06/15/2006    Percival Spanish, PT, MPT 11-11-2016, 10:56 AM  [facility] [address]  [place], [place], [address] Phone: [phone]   Fax:  [phone]  Name:  Niv Darley MRN: [id number] Date of Birth: May 18, 1940

## 2016-10-26 NOTE — Telephone Encounter (Signed)
refer to podiatry, dx feet calous  Received: Today  Message Contents  Colon Branch, MD  Damita Dunnings, [place]

## 2016-10-26 NOTE — Telephone Encounter (Signed)
Referral placed.

## 2016-11-02 ENCOUNTER — Other Ambulatory Visit: Payer: Self-pay | Admitting: Internal Medicine

## 2016-11-03 NOTE — Telephone Encounter (Signed)
Pt is requesting refill on alprazolam 0.25mg   Last OV: 09/27/2016  Last Fill: 02/18/2016 #90 and 1RF UDS: 11/05/2015 Low risk  [place] database printed, no issues noted   Please advise.

## 2016-11-03 NOTE — Telephone Encounter (Signed)
Rx printed, awaiting MD signature.  

## 2016-11-03 NOTE — Telephone Encounter (Signed)
Correction- #40 and 1RF, per PCP- dispense 40 and 1RF.

## 2016-11-03 NOTE — Telephone Encounter (Signed)
Rx faxed to [facility].

## 2016-11-03 NOTE — Telephone Encounter (Signed)
Okay #90 and 1 refill

## 2016-11-06 ENCOUNTER — Encounter: Payer: Self-pay | Admitting: Internal Medicine

## 2016-11-08 ENCOUNTER — Telehealth: Payer: Self-pay

## 2016-11-08 DIAGNOSIS — I639 Cerebral infarction, unspecified: Secondary | ICD-10-CM

## 2016-11-08 NOTE — Telephone Encounter (Signed)
-----   Message from Colon Branch, MD sent at 11/08/2016  4:37 PM EDT ----- Regarding: RE: SLP referral Mechele Claude, thank you for your note. Braedyn Kauk please enter a ST evaluation, see below. JP   ----- Message ----- From: Percival Spanish, PT Sent: 11/08/2016   4:25 PM To: Colon Branch, MD Subject: SLP referral                                   Dr. [name],  I noticed that Gahel is scheduled to see you on 11/10/16. As of his PT eval on 10/21/16, he had no skilled PT needs but my recommendations included a SLP referral for "ST to eval and treat" for the pt's concerns regarding dysphasia and word finding. I saw that Dr. Nani Ravens signed off on my eval, but did not see the SLP referral. If this is still a concern for the Mr. Heideman, can you please address this at your appointment with him on Wednesday?  Thanks,  Percival Spanish, PT, MPT  [facility] [address]  [place], [place], [address] Phone: [phone]   Fax:  [phone]

## 2016-11-08 NOTE — Telephone Encounter (Signed)
ST referral placed.

## 2016-11-09 ENCOUNTER — Encounter: Payer: Self-pay | Admitting: Podiatry

## 2016-11-09 ENCOUNTER — Ambulatory Visit (INDEPENDENT_AMBULATORY_CARE_PROVIDER_SITE_OTHER): Payer: PPO | Admitting: Podiatry

## 2016-11-09 DIAGNOSIS — M2012 Hallux valgus (acquired), left foot: Secondary | ICD-10-CM | POA: Diagnosis not present

## 2016-11-09 DIAGNOSIS — M6740 Ganglion, unspecified site: Secondary | ICD-10-CM | POA: Diagnosis not present

## 2016-11-09 DIAGNOSIS — M2011 Hallux valgus (acquired), right foot: Secondary | ICD-10-CM

## 2016-11-09 DIAGNOSIS — L84 Corns and callosities: Secondary | ICD-10-CM

## 2016-11-09 NOTE — Patient Instructions (Signed)
Ganglion Cyst A ganglion cyst is a noncancerous, fluid-filled lump that occurs near joints or tendons. The ganglion cyst grows out of a joint or the lining of a tendon. It most often develops in the hand or wrist, but it can also develop in the shoulder, elbow, hip, knee, ankle, or foot. The round or oval ganglion cyst can be the size of a pea or larger than a grape. Increased activity may enlarge the size of the cyst because more fluid starts to build up. What are the causes? It is not known what causes a ganglion cyst to grow. However, it may be related to:  Inflammation or irritation around the joint.  An injury.  Repetitive movements or overuse.  Arthritis.  What increases the risk? Risk factors include:  Being a woman.  Being age 62-50.  What are the signs or symptoms? Symptoms may include:  A lump. This most often appears on the hand or wrist, but it can occur in other areas of the body.  Tingling.  Pain.  Numbness.  Muscle weakness.  Weak grip.  Less movement in a joint.  How is this diagnosed? Ganglion cysts are most often diagnosed based on a physical exam. Your health care provider will feel the lump and may shine a light alongside it. If it is a ganglion cyst, a light often shines through it. Your health care provider may order an X-ray, ultrasound, or MRI to rule out other conditions. How is this treated? Ganglion cysts usually go away on their own without treatment. If pain or other symptoms are involved, treatment may be needed. Treatment is also needed if the ganglion cyst limits your movement or if it gets infected. Treatment may include:  Wearing a brace or splint on your wrist or finger.  Taking anti-inflammatory medicine.  Draining fluid from the lump with a needle (aspiration).  Injecting a steroid into the joint.  Surgery to remove the ganglion cyst.  Follow these instructions at home:  Do not press on the ganglion cyst, poke it with a  needle, or hit it.  Take medicines only as directed by your health care provider.  Wear your brace or splint as directed by your health care provider.  Watch your ganglion cyst for any changes.  Keep all follow-up visits as directed by your health care provider. This is important. Contact a health care provider if:  Your ganglion cyst becomes larger or more painful.  You have increased redness, red streaks, or swelling.  You have pus coming from the lump.  You have weakness or numbness in the affected area.  You have a fever or chills. This information is not intended to replace advice given to you by your health care provider. Make sure you discuss any questions you have with your health care provider. Document Released: 02/06/2000 Document Revised: 07/17/2015 Document Reviewed: 07/24/2013 Elsevier Interactive Patient Education  2018 [employer].

## 2016-11-09 NOTE — Progress Notes (Signed)
   Subjective:    Patient ID: Kevin Bass, male    DOB: [birth year], 76 y.o.   MRN: [id number]  HPI   I have some calluses that I would like to have looked at and I am not a diabetic and I also have a knot on the top of my right foot and I have recently just noticed it about two weeks ago and does not really hurt     Review of Systems  All other systems reviewed and are negative.      Objective:   Physical Exam        Assessment & Plan:

## 2016-11-09 NOTE — Progress Notes (Signed)
Subjective:    Patient ID: Kevin Bass, male   DOB: 76 y.o.   MRN: [id number]   HPI Kevin Bass the office today for concerns of a "knot" on the top of his right foot and he points on the dorsal aspect of the first MTPJ. He states that he just notices a couple weeks ago when he was putting his shoes on. He states the area is not painful he denies any recent injury or trauma. He had no recent treatment for this one step the area checked. He has calluses to the balls of his feet but are not painful. He denies any claudication symptoms. He has no numbness or tingling to his feet and he has no other concerns.  Review of Systems  All other systems reviewed and are negative.       Objective:  Physical Exam General: AAO x3, NAD  Dermatological: Minimal hyperkeratotic tissue present bilateral submetatarsal 2 as well as the distal aspect of the right third and left second toe. No underlying ulceration, drainage or any clinical signs of infection are present. No open lesions or pre-ulcerative lesions are identified otherwise  Vascular: Dorsalis Pedis artery and Posterior Tibial artery pedal pulses are 2/4 bilateral with immedate capillary fill time.There is no pain with calf compression, swelling, warmth, erythema.   Neruologic: Grossly intact via light touch bilateral.  Protective threshold with Semmes Wienstein monofilament intact to all pedal sites bilateral.   Musculoskeletal: On the dorsal aspect of the right first MTPJ is a fluid-filled mobile soft tissue mass consistent with a ganglion cyst. There is no tenderness palpation of this area measures approximately 1 cm x 1 center. There is no overlying erythema or skin discoloration. There is no pain with MPJ range of motion crepitation. There is significant HAV present bilaterally as well as hammertoe contractures present. Muscular strength 5/5 in all groups tested bilateral.  Gait: Unassisted, Nonantalgic.      Assessment:      Right foot ganglion cyst; hyperkeratotic lesions     Plan:     -Treatment options discussed including all alternatives, risks, and complications -Etiology of symptoms were discussed -Discussed and aspiration/steroid injection for this ganglion cyst. He wishes to hold off on continue to monitor the area. If the area becomes symptomatic we will proceed with this cover this time is a symptomatic. -Lightly debrided hyperkeratotic tissue today although minimal. Recommend moisturizer to the area as well as a pumice stone. -Also discussed an orthotic may be beneficial to help take pressure off the area. Follow as needed. Call any questions concerns.  Celesta Gentile, DPM

## 2016-11-10 ENCOUNTER — Ambulatory Visit (INDEPENDENT_AMBULATORY_CARE_PROVIDER_SITE_OTHER): Payer: PPO | Admitting: Internal Medicine

## 2016-11-10 ENCOUNTER — Encounter: Payer: Self-pay | Admitting: Internal Medicine

## 2016-11-10 VITALS — BP 118/66 | HR 72 | Temp 98.0°F | Resp 14 | Ht 70.0 in | Wt 181.5 lb

## 2016-11-10 DIAGNOSIS — I639 Cerebral infarction, unspecified: Secondary | ICD-10-CM

## 2016-11-10 DIAGNOSIS — F419 Anxiety disorder, unspecified: Secondary | ICD-10-CM

## 2016-11-10 DIAGNOSIS — Z0283 Encounter for blood-alcohol and blood-drug test: Secondary | ICD-10-CM

## 2016-11-10 DIAGNOSIS — E785 Hyperlipidemia, unspecified: Secondary | ICD-10-CM | POA: Diagnosis not present

## 2016-11-10 LAB — AST: AST: 49 U/L — AB (ref 0–37)

## 2016-11-10 LAB — LIPID PANEL
CHOL/HDL RATIO: 2
Cholesterol: 110 mg/dL (ref 0–200)
HDL: 47.6 mg/dL (ref >39.00)
LDL Cholesterol: 43 mg/dL (ref 0–99)
NonHDL: 62.44
TRIGLYCERIDES: 98 mg/dL (ref 0.0–149.0)
VLDL: 19.6 mg/dL (ref 0.0–40.0)

## 2016-11-10 LAB — ALT: ALT: 58 U/L — ABNORMAL HIGH (ref 0–53)

## 2016-11-10 NOTE — Patient Instructions (Signed)
GO TO THE LAB : Get the blood work     GO TO THE FRONT DESK Schedule your next appointment for a physical exam in 4-5 months  

## 2016-11-10 NOTE — Progress Notes (Signed)
Pre visit review using our clinic review tool, if applicable. No additional management support is needed unless otherwise documented below in the visit note. 

## 2016-11-10 NOTE — Progress Notes (Signed)
Subjective:    Patient ID: Kevin Bass, male    DOB: 19-Nov-1940, 76 y.o.   MRN: [id number]  DOS:  11/10/2016 Type of visit - description : f/u Interval history: High cholesterol: Good compliance with medication. Recent stroke, note from neurology and cardiology reviewed. Reports difficulty finding words, requested ST referral.  HTN: Well-controlled in the ambulatory setting, he is taking metoprolol only one tablet daily.   Review of Systems Denies unusual aches or pains. No new stroke symptoms  Past Medical History:  Diagnosis Date  . Allergy   . Anxiety   . Arthritis    hands  . Colon cancer [facility])    s/p partial colectomy 1990, Cscope neg 7-09, next 2014  . DISORDER, MITRAL VALVE    MV recontruction at [place] 2006----still needs ABX for SBE prophylaxis   . Elevated PSA    [name]  . GERD (gastroesophageal reflux disease)   . Heart murmur    no problems since surgery  . HYPERGLYCEMIA, BORDERLINE 10/16/2008  . HYPERLIPIDEMIA 06/15/2006   diet controlled  . Hypertension   . HYPERTROPHY PROSTATE W/O UR OBST & OTH LUTS 10/16/2008  . Right groin pain    Dr. Redmond Pulling  . Wears partial dentures    lower partial    Past Surgical History:  Procedure Laterality Date  . COLECTOMY  1990  . COLONOSCOPY    . HERNIA REPAIR Left 2014   [facility] rep w/mesh- March 2014  . MITRAL VALVE ANNULOPLASTY     [facility] 2006  . TEE WITHOUT CARDIOVERSION N/A 10/05/2016   Procedure: TRANSESOPHAGEAL ECHOCARDIOGRAM (TEE);  Surgeon: Kevin Casino, MD;  Location: [facility] ENDOSCOPY;  Service: Cardiovascular;  Laterality: N/A;    Social History   Social History  . Marital status: Married    Spouse name: Kevin Bass  . Number of children: 2  . Years of education: college   Occupational History  . retired     Social History Main Topics  . Smoking status: Former Smoker    Packs/day: 0.50    Years: 20.00    Types: Cigarettes    Quit date: 02/23/1983  . Smokeless tobacco: Never Used  . Alcohol use 4.2  oz/week    7 Glasses of wine per week  . Drug use: No  . Sexual activity: Not on file   Other Topics Concern  . Not on file   Social History Narrative   From [place], household pt and wife   Daughter in [place], son in [place] as of 11/10/2016      Reactions   Statins Other (See Comments)   Other reaction(s): Myalgias (intolerance)      Medication List       Accurate as of 11/10/16 11:59 PM. Always use your most recent med list.          ALPRAZolam 0.25 MG tablet Commonly known as:  XANAX Take 1-2 tablets (0.25-0.5 mg total) by mouth 2 (two) times daily as needed for anxiety.   Arginine 500 MG Caps Take 1,500 mg by mouth daily.   aspirin EC 81 MG tablet Take 81 mg by mouth at bedtime.   clopidogrel 75 MG tablet Commonly known as:  PLAVIX Take 1 tablet (75 mg total) by mouth daily.   ezetimibe 10 MG tablet Commonly known as:  ZETIA Take 1 tablet (10 mg total) by mouth daily.   FISH OIL PO Take 1,600 mg by mouth daily.  gabapentin 300 MG capsule Commonly known as:  NEURONTIN Take 1 capsule (300 mg total) by mouth 2 (two) times daily.   hydrochlorothiazide 25 MG tablet Commonly known as:  HYDRODIURIL Take 25 mg by mouth at bedtime.   JOINT HEALTH PO Take 1 tablet by mouth daily. JARROW JOINT BUILDER   loratadine 10 MG tablet Commonly known as:  CLARITIN Take 10 mg by mouth at bedtime.   LYCOPENE PO Take 1 capsule by mouth daily. JARROW FORMULA LYCO-SORB PROSTATE SUPPORT   metoprolol tartrate 25 MG tablet Commonly known as:  LOPRESSOR Take 1 tablet (25 mg total) by mouth daily.   OCUVITE PRESERVISION PO Take 1 tablet by mouth 2 (two) times daily.   omeprazole 40 MG capsule Commonly known as:  PRILOSEC Take 1 capsule (40 mg total) by mouth daily.   RED YEAST RICE PO Take 1 tablet by mouth 2 (two) times daily. MORNING & AFTERNOON   rosuvastatin 20 MG tablet Commonly known as:  CRESTOR Take 1 tablet (20 mg total) by mouth at  bedtime.   UNABLE TO FIND Outpatient physical therapy, occupational therapy, speech therapy  Diagnosis: CVA            Discharge Care Instructions        Start     Ordered   11/10/16 0000  AST     11/10/16 1155   11/10/16 0000  ALT     11/10/16 1155   11/10/16 0000  Lipid panel     11/10/16 1155   11/10/16 0000  Pain Mgmt, Profile 8 w/Conf, U     11/10/16 1156         Objective:   Physical Exam BP 118/66 (BP Location: Left Arm, Patient Position: Sitting, Cuff Size: Small)   Pulse 72   Temp 98 F (36.7 C) (Oral)   Resp 14   Ht 5\' 10"  (1.778 m)   Wt 181 lb 8 oz (82.3 kg)   SpO2 96%   BMI 26.04 kg/m  General:   Well developed, well nourished . NAD.  HEENT:  Normocephalic . Face symmetric, atraumatic Lungs:  CTA B Normal respiratory effort, no intercostal retractions, no accessory muscle use. Heart: RRR,  no murmur.  No pretibial edema bilaterally  Skin: Not pale. Not jaundice Neurologic:  alert & oriented X3.  Speech normal and fluent. gait appropriate for age and unassisted Psych--  Cognition and judgment appear intact.  Cooperative with normal attention span and concentration.  Behavior appropriate. No anxious or depressed appearing.      Assessment & Plan:   Assessment Prediabetes HTN Hyperlipidemia, statin intolerant (simva-pravachol: shoulder pain) Anxiety- rarely takes xanax Facial neuropathy dx 2015, no w/u, sx resurface x1 after temporary d/c of meds  CV: --Mitral valve annuloplasty 2006-- needs SBE prophylaxis -- SOB 04-2016, seen at [facility], had a ECHO Nl fx MV, cath: no CAD --Cryptogenic stroke 09/12/2016, had slurred speech. Was recommended a loop recorder.started plavix Elevated PSA and BPH-- Dr. [name] Colon cancer partial colectomy 1990, colonoscopy 08-2007 negative, Cscope again 09-2012 normal, 5 years   PLAN: Stroke 09/12/16 Since the last visit, saw neurology, they recommend to continue with present care, had a TEE and a loop  recorder. TEE was (-) 10-05-16. Loop recorder implantation was canceled. Since the stroke, has noted difficulty finding words, already been referred to ST. Hyperlipidemia: Currently on Crestor 20 mg daily and Zetia. Seems to be tolerating well. Check FLP, AST, ALT HTN: Was prescribed metoprolol twice a day but only taking  it once a day, very reluctant to change. For now will stay on metoprolol once daily. Declined a flu shot RTC 4-5 months

## 2016-11-11 LAB — PAIN MGMT, PROFILE 8 W/CONF, U
6 ACETYLMORPHINE: NEGATIVE ng/mL (ref <10)
AMPHETAMINES: NEGATIVE ng/mL (ref <500)
Alcohol Metabolites: NEGATIVE ng/mL (ref <500)
BUPRENORPHINE, URINE: NEGATIVE ng/mL (ref <5)
Benzodiazepines: NEGATIVE ng/mL (ref <100)
Cocaine Metabolite: NEGATIVE ng/mL (ref <150)
Creatinine: 131.1 mg/dL
MDMA: NEGATIVE ng/mL (ref <500)
Marijuana Metabolite: NEGATIVE ng/mL (ref <20)
OXIDANT: NEGATIVE ug/mL (ref <200)
OXYCODONE: NEGATIVE ng/mL (ref <100)
Opiates: NEGATIVE ng/mL (ref <100)
pH: 6.66 (ref 4.5–9.0)

## 2016-11-11 NOTE — Assessment & Plan Note (Signed)
Stroke 09/12/16 Since the last visit, saw neurology, they recommend to continue with present care, had a TEE and a loop recorder. TEE was (-) 10-05-16. Loop recorder implantation was canceled. Since the stroke, has noted difficulty finding words, already been referred to ST. Hyperlipidemia: Currently on Crestor 20 mg daily and Zetia. Seems to be tolerating well. Check FLP, AST, ALT HTN: Was prescribed metoprolol twice a day but only taking it once a day, very reluctant to change. For now will stay on metoprolol once daily. Declined a flu shot RTC 4-5 months

## 2016-11-15 NOTE — Addendum Note (Signed)
Addended byDamita Dunnings D on: 11/15/2016 08:21 AM   Modules accepted: Orders

## 2016-11-29 ENCOUNTER — Telehealth: Payer: Self-pay | Admitting: Internal Medicine

## 2016-11-29 NOTE — Telephone Encounter (Signed)
Patient Name: Kevin Bass  DOB: 11-Apr-1940    Initial Comment [name] states, office pt. is having dizziness, fell against fridge, never had it happen- bp 135/76 concerns.    Nurse Assessment  Nurse: Adrian Blackwater, RN, Claiborne Billings Date/Time Eilene Ghazi Time): 11/29/2016 3:03:24 PM  Confirm and document reason for call. If symptomatic, describe symptoms. ---[name] states his head started spinning and he got sweaty. fell against the refrigerator. His BP was 155/95 and then in a few minutes was 127/90, then 135/76. This was about 30 minutes ago. Now he has a headache and some nausea. 3 months ago he had a stroke.  Does the patient have any new or worsening symptoms? ---Yes  Will a triage be completed? ---Yes  Related visit to physician within the last 2 weeks? ---No  Does the PT have any chronic conditions? (i.e. diabetes, asthma, etc.) ---Yes  List chronic conditions. ---Hx stroke 3 months ago Htn  Is this a behavioral health or substance abuse call? ---No     Guidelines    Guideline Title Affirmed Question Affirmed Notes  Dizziness - Lightheadedness [1] MODERATE dizziness (e.g., interferes with normal activities) AND [2] has NOT been evaluated by physician for this (Exception: dizziness caused by heat exposure, sudden standing, or poor fluid intake)    Final Disposition User   See Physician within [place], RN, Claiborne Billings    Comments  Appt scheduled for 9:30 am tomorrow with PCP   Referrals  GO TO FACILITY UNDECIDED   [name] Disagree/Comply Comply  [name] Understands Yes  PreDisposition Call Doctor

## 2016-11-29 NOTE — Telephone Encounter (Signed)
PT states became dizzy and fell against refrigerator twenty min ago states bp was high. transf call to [facility].

## 2016-11-30 ENCOUNTER — Emergency Department (HOSPITAL_COMMUNITY): Payer: PPO

## 2016-11-30 ENCOUNTER — Encounter (HOSPITAL_COMMUNITY): Payer: Self-pay | Admitting: Emergency Medicine

## 2016-11-30 ENCOUNTER — Inpatient Hospital Stay (HOSPITAL_COMMUNITY)
Admission: EM | Admit: 2016-11-30 | Discharge: 2016-12-02 | DRG: 042 | Disposition: A | Discharge status: Home / Self Care | Payer: PPO | Attending: Internal Medicine | Admitting: Internal Medicine

## 2016-11-30 ENCOUNTER — Inpatient Hospital Stay (HOSPITAL_COMMUNITY): Payer: PPO

## 2016-11-30 ENCOUNTER — Other Ambulatory Visit: Payer: Self-pay

## 2016-11-30 ENCOUNTER — Ambulatory Visit (INDEPENDENT_AMBULATORY_CARE_PROVIDER_SITE_OTHER): Payer: PPO | Admitting: Internal Medicine

## 2016-11-30 ENCOUNTER — Encounter: Payer: Self-pay | Admitting: Internal Medicine

## 2016-11-30 VITALS — BP 122/74 | HR 71 | Temp 98.1°F | Resp 14 | Ht 70.0 in | Wt 181.2 lb

## 2016-11-30 DIAGNOSIS — J9811 Atelectasis: Secondary | ICD-10-CM | POA: Diagnosis not present

## 2016-11-30 DIAGNOSIS — Z9049 Acquired absence of other specified parts of digestive tract: Secondary | ICD-10-CM

## 2016-11-30 DIAGNOSIS — Z7902 Long term (current) use of antithrombotics/antiplatelets: Secondary | ICD-10-CM

## 2016-11-30 DIAGNOSIS — Z23 Encounter for immunization: Secondary | ICD-10-CM | POA: Diagnosis not present

## 2016-11-30 DIAGNOSIS — I639 Cerebral infarction, unspecified: Secondary | ICD-10-CM | POA: Diagnosis not present

## 2016-11-30 DIAGNOSIS — C3490 Malignant neoplasm of unspecified part of unspecified bronchus or lung: Secondary | ICD-10-CM | POA: Diagnosis not present

## 2016-11-30 DIAGNOSIS — R29701 NIHSS score 1: Secondary | ICD-10-CM | POA: Diagnosis present

## 2016-11-30 DIAGNOSIS — K219 Gastro-esophageal reflux disease without esophagitis: Secondary | ICD-10-CM | POA: Diagnosis not present

## 2016-11-30 DIAGNOSIS — Z888 Allergy status to other drugs, medicaments and biological substances status: Secondary | ICD-10-CM | POA: Diagnosis not present

## 2016-11-30 DIAGNOSIS — Z7982 Long term (current) use of aspirin: Secondary | ICD-10-CM

## 2016-11-30 DIAGNOSIS — E785 Hyperlipidemia, unspecified: Secondary | ICD-10-CM | POA: Diagnosis present

## 2016-11-30 DIAGNOSIS — Z951 Presence of aortocoronary bypass graft: Secondary | ICD-10-CM

## 2016-11-30 DIAGNOSIS — I700 Atherosclerosis of aorta: Secondary | ICD-10-CM | POA: Diagnosis present

## 2016-11-30 DIAGNOSIS — Z87891 Personal history of nicotine dependence: Secondary | ICD-10-CM | POA: Diagnosis not present

## 2016-11-30 DIAGNOSIS — I10 Essential (primary) hypertension: Secondary | ICD-10-CM | POA: Diagnosis present

## 2016-11-30 DIAGNOSIS — R739 Hyperglycemia, unspecified: Secondary | ICD-10-CM | POA: Diagnosis present

## 2016-11-30 DIAGNOSIS — I059 Rheumatic mitral valve disease, unspecified: Secondary | ICD-10-CM | POA: Diagnosis present

## 2016-11-30 DIAGNOSIS — N400 Enlarged prostate without lower urinary tract symptoms: Secondary | ICD-10-CM | POA: Diagnosis present

## 2016-11-30 DIAGNOSIS — W1830XA Fall on same level, unspecified, initial encounter: Secondary | ICD-10-CM | POA: Diagnosis present

## 2016-11-30 DIAGNOSIS — R51 Headache: Secondary | ICD-10-CM | POA: Diagnosis not present

## 2016-11-30 DIAGNOSIS — Z85038 Personal history of other malignant neoplasm of large intestine: Secondary | ICD-10-CM | POA: Diagnosis not present

## 2016-11-30 DIAGNOSIS — I69328 Other speech and language deficits following cerebral infarction: Secondary | ICD-10-CM | POA: Diagnosis not present

## 2016-11-30 DIAGNOSIS — I2510 Atherosclerotic heart disease of native coronary artery without angina pectoris: Secondary | ICD-10-CM | POA: Diagnosis not present

## 2016-11-30 DIAGNOSIS — J309 Allergic rhinitis, unspecified: Secondary | ICD-10-CM | POA: Diagnosis present

## 2016-11-30 DIAGNOSIS — F419 Anxiety disorder, unspecified: Secondary | ICD-10-CM | POA: Diagnosis not present

## 2016-11-30 DIAGNOSIS — I371 Nonrheumatic pulmonary valve insufficiency: Secondary | ICD-10-CM | POA: Diagnosis not present

## 2016-11-30 DIAGNOSIS — Z972 Presence of dental prosthetic device (complete) (partial): Secondary | ICD-10-CM | POA: Diagnosis not present

## 2016-11-30 DIAGNOSIS — Z800 Family history of malignant neoplasm of digestive organs: Secondary | ICD-10-CM | POA: Diagnosis not present

## 2016-11-30 DIAGNOSIS — G500 Trigeminal neuralgia: Secondary | ICD-10-CM | POA: Diagnosis present

## 2016-11-30 DIAGNOSIS — I6389 Other cerebral infarction: Principal | ICD-10-CM | POA: Diagnosis present

## 2016-11-30 DIAGNOSIS — I6319 Cerebral infarction due to embolism of other precerebral artery: Secondary | ICD-10-CM

## 2016-11-30 DIAGNOSIS — Z79899 Other long term (current) drug therapy: Secondary | ICD-10-CM

## 2016-11-30 DIAGNOSIS — E7849 Other hyperlipidemia: Secondary | ICD-10-CM | POA: Diagnosis not present

## 2016-11-30 LAB — URINALYSIS, ROUTINE W REFLEX MICROSCOPIC
BILIRUBIN URINE: NEGATIVE
Bacteria, UA: NONE SEEN
Glucose, UA: NEGATIVE mg/dL
HGB URINE DIPSTICK: NEGATIVE
Ketones, ur: 80 mg/dL — AB
LEUKOCYTES UA: NEGATIVE
Nitrite: NEGATIVE
PH: 5 (ref 5.0–8.0)
Protein, ur: >=300 mg/dL — AB
SPECIFIC GRAVITY, URINE: 1.027 (ref 1.005–1.030)

## 2016-11-30 LAB — APTT: APTT: 29 s (ref 24–36)

## 2016-11-30 LAB — COMPREHENSIVE METABOLIC PANEL
ALT: 47 U/L (ref 17–63)
AST: 44 U/L — AB (ref 15–41)
Albumin: 4.3 g/dL (ref 3.5–5.0)
Alkaline Phosphatase: 49 U/L (ref 38–126)
Anion gap: 12 (ref 5–15)
BILIRUBIN TOTAL: 1.4 mg/dL — AB (ref 0.3–1.2)
BUN: 17 mg/dL (ref 6–20)
CO2: 27 mmol/L (ref 22–32)
CREATININE: 0.96 mg/dL (ref 0.61–1.24)
Calcium: 9.5 mg/dL (ref 8.9–10.3)
Chloride: 96 mmol/L — ABNORMAL LOW (ref 101–111)
Glucose, Bld: 113 mg/dL — ABNORMAL HIGH (ref 65–99)
POTASSIUM: 3.6 mmol/L (ref 3.5–5.1)
Sodium: 135 mmol/L (ref 135–145)
TOTAL PROTEIN: 7.3 g/dL (ref 6.5–8.1)

## 2016-11-30 LAB — CBC
HEMATOCRIT: 45.8 % (ref 39.0–52.0)
HEMOGLOBIN: 15.3 g/dL (ref 13.0–17.0)
MCH: 28 pg (ref 26.0–34.0)
MCHC: 33.4 g/dL (ref 30.0–36.0)
MCV: 83.9 fL (ref 78.0–100.0)
Platelets: 201 10*3/uL (ref 150–400)
RBC: 5.46 MIL/uL (ref 4.22–5.81)
RDW: 12.8 % (ref 11.5–15.5)
WBC: 10 10*3/uL (ref 4.0–10.5)

## 2016-11-30 LAB — DIFFERENTIAL
BASOS ABS: 0 10*3/uL (ref 0.0–0.1)
Basophils Relative: 0 %
EOS ABS: 0 10*3/uL (ref 0.0–0.7)
Eosinophils Relative: 0 %
LYMPHS ABS: 0.9 10*3/uL (ref 0.7–4.0)
Lymphocytes Relative: 9 %
MONO ABS: 0.4 10*3/uL (ref 0.1–1.0)
MONOS PCT: 4 %
Neutro Abs: 8.7 10*3/uL — ABNORMAL HIGH (ref 1.7–7.7)
Neutrophils Relative %: 87 %

## 2016-11-30 LAB — RAPID URINE DRUG SCREEN, HOSP PERFORMED
Amphetamines: NOT DETECTED
Barbiturates: NOT DETECTED
Benzodiazepines: NOT DETECTED
COCAINE: NOT DETECTED
Opiates: NOT DETECTED
Tetrahydrocannabinol: NOT DETECTED

## 2016-11-30 LAB — LIPID PANEL
Cholesterol: 119 mg/dL (ref 0–200)
HDL: 44 mg/dL (ref >40)
LDL CALC: 60 mg/dL (ref 0–99)
Total CHOL/HDL Ratio: 2.7 RATIO
Triglycerides: 76 mg/dL (ref <150)
VLDL: 15 mg/dL (ref 0–40)

## 2016-11-30 LAB — MRSA PCR SCREENING: MRSA BY PCR: NEGATIVE

## 2016-11-30 LAB — PROTIME-INR
INR: 1.14
Prothrombin Time: 14.5 seconds (ref 11.4–15.2)

## 2016-11-30 LAB — I-STAT TROPONIN, ED: TROPONIN I, POC: 0 ng/mL (ref 0.00–0.08)

## 2016-11-30 LAB — HEMOGLOBIN A1C
HEMOGLOBIN A1C: 5.5 % (ref 4.8–5.6)
MEAN PLASMA GLUCOSE: 111.15 mg/dL

## 2016-11-30 LAB — ETHANOL: Alcohol, Ethyl (B): <10 mg/dL (ref <10)

## 2016-11-30 MED ORDER — ROSUVASTATIN CALCIUM 10 MG PO TABS
20.0000 mg | ORAL_TABLET | Freq: Every day | ORAL | Status: DC
  Administered 2016-11-30 – 2016-12-01 (×2): 20 mg via ORAL
  Filled 2016-11-30: qty 2
  Filled 2016-11-30: qty 1
  Filled 2016-11-30: qty 2
  Filled 2016-11-30: qty 1

## 2016-11-30 MED ORDER — STROKE: EARLY STAGES OF RECOVERY BOOK
Freq: Once | Status: AC
  Administered 2016-11-30: 14:00:00
  Filled 2016-11-30 (×2): qty 1

## 2016-11-30 MED ORDER — ACETAMINOPHEN 650 MG RE SUPP: 650.0000 mg | RECTAL | Status: DC | PRN

## 2016-11-30 MED ORDER — SENNOSIDES-DOCUSATE SODIUM 8.6-50 MG PO TABS: 1.0000 | ORAL_TABLET | Freq: Every evening | ORAL | Status: DC | PRN

## 2016-11-30 MED ORDER — CLOPIDOGREL BISULFATE 75 MG PO TABS
75.0000 mg | ORAL_TABLET | Freq: Every day | ORAL | Status: DC
  Administered 2016-11-30 – 2016-12-02 (×3): 75 mg via ORAL
  Filled 2016-11-30 (×3): qty 1

## 2016-11-30 MED ORDER — ASPIRIN EC 81 MG PO TBEC
81.0000 mg | DELAYED_RELEASE_TABLET | Freq: Every day | ORAL | Status: DC
  Administered 2016-11-30 – 2016-12-01 (×2): 81 mg via ORAL
  Filled 2016-11-30 (×2): qty 1

## 2016-11-30 MED ORDER — ACETAMINOPHEN 160 MG/5ML PO SOLN: 650.0000 mg | ORAL | Status: DC | PRN

## 2016-11-30 MED ORDER — IOPAMIDOL (ISOVUE-370) INJECTION 76%
INTRAVENOUS | Status: AC
  Administered 2016-11-30: 50 mL via INTRAVENOUS
  Filled 2016-11-30: qty 50

## 2016-11-30 MED ORDER — ACETAMINOPHEN 325 MG PO TABS
650.0000 mg | ORAL_TABLET | ORAL | Status: DC | PRN
  Administered 2016-11-30 – 2016-12-02 (×9): 650 mg via ORAL
  Filled 2016-11-30 (×10): qty 2

## 2016-11-30 MED ORDER — ARGININE 500 MG PO CAPS: 1500.0000 mg | ORAL_CAPSULE | Freq: Every day | ORAL | Status: DC

## 2016-11-30 MED ORDER — LORATADINE 10 MG PO TABS
10.0000 mg | ORAL_TABLET | Freq: Every day | ORAL | Status: DC
  Administered 2016-11-30 – 2016-12-01 (×2): 10 mg via ORAL
  Filled 2016-11-30 (×2): qty 1

## 2016-11-30 MED ORDER — EZETIMIBE 10 MG PO TABS
10.0000 mg | ORAL_TABLET | Freq: Every day | ORAL | Status: DC
  Administered 2016-11-30 – 2016-12-01 (×2): 10 mg via ORAL
  Filled 2016-11-30 (×2): qty 1

## 2016-11-30 MED ORDER — GABAPENTIN 300 MG PO CAPS
300.0000 mg | ORAL_CAPSULE | Freq: Every day | ORAL | Status: DC
  Administered 2016-11-30 – 2016-12-01 (×2): 300 mg via ORAL
  Filled 2016-11-30 (×2): qty 1

## 2016-11-30 MED ORDER — ALPRAZOLAM 0.5 MG PO TABS: 0.2500 mg | ORAL_TABLET | Freq: Two times a day (BID) | ORAL | Status: DC | PRN

## 2016-11-30 MED ORDER — PANTOPRAZOLE SODIUM 40 MG PO TBEC
80.0000 mg | DELAYED_RELEASE_TABLET | Freq: Every day | ORAL | Status: DC
Start: 2016-11-30 — End: 2016-12-02
  Administered 2016-11-30 – 2016-12-02 (×3): 80 mg via ORAL
  Filled 2016-11-30 (×4): qty 2

## 2016-11-30 NOTE — ED Notes (Signed)
Patient transported to CT 

## 2016-11-30 NOTE — H&P (Signed)
History and Physical    Kevin Bass FGH:[id number] DOB: Oct 23, 1940 DOA: 11/30/2016  PCP: Colon Branch, MD Patient coming from: home  Chief Complaint: dizziness  HPI: Kevin Bass is a very pleasant 76 y.o. male with medical history significant for stroke 3 months ago, hypertension, hyperlipidemia, hyperglycemia presents to emergency department from his primary care provider's office chief complaint of dizziness. PCP was concerned about stroke so patient presented to the emergency department. [facility] are asked to admit.  Information is obtained from the patient and his wife is at the bedside. He states he was in his usual state of health until about 2:00 yesterday when he suddenly became very dizzy. He describes the feeling as "spinning". He states his gait became unsteady had hold onto the walls and furniture ultimately he did fall. He denies hitting his head or losing consciousness. Associated symptoms include mild shortness of breath and some diaphoresis. He then developed a frontal headache and some nausea. He denies chest pain palpitations abdominal pain vomiting. He reports he called the on-call service who recommended that he see his PCP next day. He reports he slept well through the night workup about 6 AM but he still had a headache. He reports he took Motrin with some improvement.    ED Course: In the emergency department he's afebrile hemodynamically stable and not hypoxic.  Review of Systems: As per HPI otherwise all other systems reviewed and are negative.   Ambulatory Status: Ambulates independently is independent with ADLs  Past Medical History:  Diagnosis Date  . Allergy   . Anxiety   . Arthritis    hands  . Colon cancer [facility])    s/p partial colectomy 1990, Cscope neg 7-09, next 2014  . DISORDER, MITRAL VALVE    MV recontruction at [place] 2006----still needs ABX for SBE prophylaxis   . Elevated PSA    [name]  . GERD (gastroesophageal reflux  disease)   . Heart murmur    no problems since surgery  . HYPERGLYCEMIA, BORDERLINE 10/16/2008  . HYPERLIPIDEMIA 06/15/2006   diet controlled  . Hypertension   . HYPERTROPHY PROSTATE W/O UR OBST & OTH LUTS 10/16/2008  . Right groin pain    Dr. Redmond Pulling  . Wears partial dentures    lower partial    Past Surgical History:  Procedure Laterality Date  . COLECTOMY  1990  . COLONOSCOPY    . HERNIA REPAIR Left 2014   [facility] rep w/mesh- March 2014  . MITRAL VALVE ANNULOPLASTY     [facility] 2006  . TEE WITHOUT CARDIOVERSION N/A 10/05/2016   Procedure: TRANSESOPHAGEAL ECHOCARDIOGRAM (TEE);  Surgeon: [name], MD;  Location: [facility] ENDOSCOPY;  Service: Cardiovascular;  Laterality: N/A;    Social History   Social History  . Marital status: Married    Spouse name: suzanne  . Number of children: 2  . Years of education: college   Occupational History  . retired     Social History Main Topics  . Smoking status: Former Smoker    Packs/day: 0.50    Years: 20.00    Types: Cigarettes    Quit date: 02/23/1983  . Smokeless tobacco: Never Used  . Alcohol use 4.2 oz/week    7 Glasses of wine per week  . Drug use: No  . Sexual activity: Not on file   Other Topics Concern  . Not on file   Social History Narrative   From [place], household pt and wife   Daughter in [place], son  in [place]  Allergen Reactions  . Statins Other (See Comments)    Other reaction(s): Myalgias (intolerance)    Family History  Problem Relation Age of Onset  . Colon cancer Other        M side (cousins)  . Diabetes Neg Hx   . Stroke Neg Hx   . CAD Neg Hx   . Prostate cancer Neg Hx   . Rectal cancer Neg Hx   . Esophageal cancer Neg Hx     Prior to Admission medications   Medication Sig Start Date End Date Taking? Authorizing Provider  ALPRAZolam (XANAX) 0.25 MG tablet Take 1-2 tablets (0.25-0.5 mg total) by mouth 2 (two) times daily as needed for anxiety. 11/03/16   Colon Branch, MD  Arginine  500 MG CAPS Take 1,500 mg by mouth daily.    [provider]  aspirin EC 81 MG tablet Take 81 mg by mouth at bedtime.    [provider]  clopidogrel (PLAVIX) 75 MG tablet Take 1 tablet (75 mg total) by mouth daily. Patient taking differently: Take 75 mg by mouth daily at 2 PM.  09/14/16   Rai, Vernelle Emerald, MD  ezetimibe (ZETIA) 10 MG tablet Take 1 tablet (10 mg total) by mouth daily. Patient taking differently: Take 10 mg by mouth at bedtime.  09/07/16   Colon Branch, MD  gabapentin (NEURONTIN) 300 MG capsule Take 1 capsule (300 mg total) by mouth 2 (two) times daily. Patient taking differently: Take 300 mg by mouth at bedtime.  06/17/16   Colon Branch, MD  Glucosamine-MSM-Hyaluronic Acd (JOINT HEALTH PO) Take 1 tablet by mouth daily. [facility]    [provider]  hydrochlorothiazide (HYDRODIURIL) 25 MG tablet Take 25 mg by mouth at bedtime.    [provider]  loratadine (CLARITIN) 10 MG tablet Take 10 mg by mouth at bedtime.     [provider]  LYCOPENE PO Take 1 capsule by mouth daily. [place] FORMULA LYCO-SORB PROSTATE SUPPORT    [provider]  metoprolol tartrate (LOPRESSOR) 25 MG tablet Take 1 tablet (25 mg total) by mouth daily. 11/10/16   Colon Branch, MD  Multiple Vitamins-Minerals (OCUVITE PRESERVISION PO) Take 1 tablet by mouth 2 (two) times daily.     [provider]  Omega-3 Fatty Acids (FISH OIL PO) Take 1,600 mg by mouth daily.     [provider]  omeprazole (PRILOSEC) 40 MG capsule Take 1 capsule (40 mg total) by mouth daily. Patient taking differently: Take 40 mg by mouth daily as needed (for acid reflux).  07/06/16   Colon Branch, MD  Red Yeast Rice Extract (RED YEAST RICE PO) Take 1 tablet by mouth 2 (two) times daily. MORNING & AFTERNOON    [provider]  rosuvastatin (CRESTOR) 20 MG tablet Take 1 tablet (20 mg total) by mouth at bedtime. 09/27/16   Colon Branch, MD  UNABLE TO FIND Outpatient  physical therapy, occupational therapy, speech therapy  Diagnosis: CVA 09/13/16   Mendel Corning, MD    Physical Exam: Vitals:   11/30/16 1215 11/30/16 1230 11/30/16 1245 11/30/16 1300  BP: (!) 157/75 (!) 146/71 134/67 138/75  Pulse: 69 68 60 (!) 58  Resp: 18 14 (!) 23 14  Temp:      TempSrc:      SpO2: 98% 99% 97% 98%     General:  Appears calm and  comfortable Sitting up in bed in no acute distress Eyes:  PERRL, EOMI, normal lids, iris ENT:  grossly normal hearing, lips & tongue, mucous membranes of his mouth are moist and pink Neck:  no LAD, masses or thyromegaly Cardiovascular:  RRR, no m/r/g. No LE edema. Pedal pulses present and palpable Respiratory:  CTA bilaterally, no w/r/r. Normal respiratory effort. Abdomen:  soft, ntnd, positive bowel sounds throughout no guarding or rebounding Skin:  no rash or induration seen on limited exam Musculoskeletal:  grossly normal tone BUE/BLE, good ROM, no bony abnormality Psychiatric:  grossly normal mood and affect, speech fluent and appropriate, AOx3 Neurologic:  Speech is slow clear somewhat hard searching. Lateral grip 5 out of 5 bilateral lower extremity strength 5 out of 5 no pronator drift tongue midline  Labs on Admission: I have personally reviewed following labs and imaging studies  CBC:  Recent Labs Lab 11/30/16 1059  WBC 10.0  NEUTROABS 8.7*  HGB 15.3  HCT 45.8  MCV 83.9  PLT 073   Basic Metabolic Panel:  Recent Labs Lab 11/30/16 1059  NA 135  K 3.6  CL 96*  CO2 27  GLUCOSE 113*  BUN 17  CREATININE 0.96  CALCIUM 9.5   GFR: Estimated Creatinine Clearance: 65.5 mL/min (by C-G formula based on SCr of 0.96 mg/dL). Liver Function Tests:  Recent Labs Lab 11/30/16 1059  AST 44*  ALT 47  ALKPHOS 49  BILITOT 1.4*  PROT 7.3  ALBUMIN 4.3   No results for input(s): LIPASE, AMYLASE in the last 168 hours. No results for input(s): AMMONIA in the last 168 hours. Coagulation Profile:  Recent Labs Lab  11/30/16 1059  INR 1.14   Cardiac Enzymes: No results for input(s): CKTOTAL, CKMB, CKMBINDEX, TROPONINI in the last 168 hours. BNP (last 3 results) No results for input(s): PROBNP in the last 8760 hours. HbA1C: No results for input(s): HGBA1C in the last 72 hours. CBG: No results for input(s): GLUCAP in the last 168 hours. Lipid Profile: No results for input(s): CHOL, HDL, LDLCALC, TRIG, CHOLHDL, LDLDIRECT in the last 72 hours. Thyroid Function Tests: No results for input(s): TSH, T4TOTAL, FREET4, T3FREE, THYROIDAB in the last 72 hours. Anemia Panel: No results for input(s): VITAMINB12, FOLATE, FERRITIN, TIBC, IRON, RETICCTPCT in the last 72 hours. Urine analysis:    Component Value Date/Time   COLORURINE AMBER (A) 11/30/2016 1109   APPEARANCEUR CLEAR 11/30/2016 1109   LABSPEC 1.027 11/30/2016 1109   PHURINE 5.0 11/30/2016 1109   GLUCOSEU NEGATIVE 11/30/2016 1109   HGBUR NEGATIVE 11/30/2016 1109   BILIRUBINUR NEGATIVE 11/30/2016 1109   KETONESUR 80 (A) 11/30/2016 1109   PROTEINUR >=300 (A) 11/30/2016 1109   NITRITE NEGATIVE 11/30/2016 1109   LEUKOCYTESUR NEGATIVE 11/30/2016 1109    Creatinine Clearance: Estimated Creatinine Clearance: 65.5 mL/min (by C-G formula based on SCr of 0.96 mg/dL).  Sepsis Labs: @LABRCNTIP (procalcitonin:4,lacticidven:4) )No results found for this or any previous visit (from the past 240 hour(s)).   Radiological Exams on Admission: Ct Head Wo Contrast  Result Date: 11/30/2016 CLINICAL DATA:  76 year old male status post left MCA infarct 1 month ago. New onset dizziness and headache for 2 days. EXAM: CT HEAD WITHOUT CONTRAST TECHNIQUE: Contiguous axial images were obtained from the base of the skull through the vertex without intravenous contrast. COMPARISON:  Brain MRI and intracranial MRA 09/12/2016. FINDINGS: Brain: Hypodense encephalomalacia corresponding to the posterior left insula and operculum area of restricted diffusion in July (series 3,  image 19). The remaining supratentorial gray-white matter  differentiation is normal. Confluent hypodensity throughout the right cerebellar tonsil and lower hemisphere (series 3, image 5). Partial effacement of the fourth ventricle, but overall mild posterior fossa mass effect. Petechial if any associated blood products. No malignant hemorrhagic transformation. The left cerebellar hemisphere is spared. No ventriculomegaly. No midline shift. No extra-axial collection. Vascular: Calcified atherosclerosis at the skull base. Skull: Intact.  No acute osseous abnormality identified. Sinuses/Orbits: Visualized paranasal sinuses and mastoids are stable and well pneumatized. Other: Visualized orbits and scalp soft tissues are within normal limits. IMPRESSION: 1. Acute to subacute Right PICA infarct affecting the cerebellum. Mild regional mass effect. No malignant hemorrhagic transformation. No ventriculomegaly. 2. Expected evolution of the July Left MCA infarct. Electronically Signed   By: Genevie Ann M.D.   On: 11/30/2016 11:33    EKG: Independently reviewed. Normal sinus rhythm Possible Anterior infarct , age undetermined   Assessment/Plan Principal Problem:   Stroke [facility]) Active Problems:   Hyperlipidemia   Mitral valve disease   HTN (hypertension)   GERD (gastroesophageal reflux disease)    #1. Stroke. Patient with cryptogenic stroke in July 2018 with residual slurred speech CT head reveals Acute to subacute Right PICA infarct affecting the cerebellum. Mild regional mass effect. No malignant hemorrhagic transformation.No ventriculomegaly. Expected evolution of the July Left MCA infarct. He is evaluated by neurology who recommend complete stroke workup and admission to the stepdown unit -Admit to step down -CTA of head and neck -MR of the head -2-D echo - Lipid panel hemoglobin A1c -PT and OT consult -Continue Plavix and aspirin -Continue Crestor  #2. Hypertension. Controlled in the emergency  department. Home meds include hydrochlorothiazide. -Hold this for now monitor closely  #3 mitral valve disease status post annuloplasty 2006. Stable. EKG as noted above. -Continue Plavix and aspirin  #4. Hyperlipidemia. -Lipid panel -Continue home meds  5. GERD. Stable -Continue home meds    DVT prophylaxis: plavix and aspirin  Code Status: full  Family Communication: wife at bedside  Disposition Plan: home  Consults called: neuro team Etta Quill PA  Admission status: inpatient    Radene Gunning MD [facility]  If 7PM-7AM, please contact night-coverage [email or web address] Password [email or web address]  11/30/2016, 1:31 PM

## 2016-11-30 NOTE — Progress Notes (Signed)
Pre visit review using our clinic review tool, if applicable. No additional management support is needed unless otherwise documented below in the visit note. 

## 2016-11-30 NOTE — Progress Notes (Signed)
Subjective:    Patient ID: Kevin Bass, male    DOB: 1940/07/02, 76 y.o.   MRN: [id number]  DOS:  11/30/2016 Type of visit - description : acute Interval history: He was at his baseline up until yesterday at 2 PM. He was at the kitchen,  he got up and suddenly got very dizzy, described as fast spinning. He had to hold onto furniture, he felt near fainting, eventually had a fall, a soft landing. There was no head injury. He was somewhat sweaty. BP was elevated at 155/95, later on BPs came down. Shortly after he developed frontal headache and some nausea, took an extra aspirin. He called  the on-call service and was recommended to come here today. He woke up @~  6 AM, woke up with a headache, again it was frontal.Took   Motrin and the headache is better today and 10:15 He continue with nausea, mild but vomited 1. He continue with dizziness, not spinning, mostly is a imbalance when he gets up.   Review of Systems Denies chest pain or palpitations. No abdominal pain or blood in the stools. He has difficulty finding worse since he had a stroke recently and that has not been worse No slurred speech, diplopia, focal deficits or facial numbness  Past Medical History:  Diagnosis Date  . Allergy   . Anxiety   . Arthritis    hands  . Colon cancer [facility])    s/p partial colectomy 1990, Cscope neg 7-09, next 2014  . DISORDER, MITRAL VALVE    MV recontruction at [place] 2006----still needs ABX for SBE prophylaxis   . Elevated PSA    [name]  . GERD (gastroesophageal reflux disease)   . Heart murmur    no problems since surgery  . HYPERGLYCEMIA, BORDERLINE 10/16/2008  . HYPERLIPIDEMIA 06/15/2006   diet controlled  . Hypertension   . HYPERTROPHY PROSTATE W/O UR OBST & OTH LUTS 10/16/2008  . Right groin pain    Dr. Redmond Pulling  . Wears partial dentures    lower partial    Past Surgical History:  Procedure Laterality Date  . COLECTOMY  1990  . COLONOSCOPY    . HERNIA REPAIR Left 2014     [facility] rep w/mesh- March 2014  . MITRAL VALVE ANNULOPLASTY     [facility] 2006  . TEE WITHOUT CARDIOVERSION N/A 10/05/2016   Procedure: TRANSESOPHAGEAL ECHOCARDIOGRAM (TEE);  Surgeon: [name], MD;  Location: [facility] ENDOSCOPY;  Service: Cardiovascular;  Laterality: N/A;    Social History   Social History  . Marital status: Married    Spouse name: Kevin Bass  . Number of children: 2  . Years of education: college   Occupational History  . retired     Social History Main Topics  . Smoking status: Former Smoker    Packs/day: 0.50    Years: 20.00    Types: Cigarettes    Quit date: 02/23/1983  . Smokeless tobacco: Never Used  . Alcohol use 4.2 oz/week    7 Glasses of wine per week  . Drug use: No  . Sexual activity: Not on file   Other Topics Concern  . Not on file   Social History Narrative   From [place], household pt and wife   Daughter in [place], son in [place] as of 11/30/2016      Reactions   Statins Other (See Comments)   Myalgias (intolerance)  Medication List       Accurate as of 11/30/16 12:07 PM. Always use your most recent med list.          ALPRAZolam 0.25 MG tablet Commonly known as:  XANAX Take 1-2 tablets (0.25-0.5 mg total) by mouth 2 (two) times daily as needed for anxiety.   Arginine 500 MG Caps Take 1,500 mg by mouth daily.   aspirin EC 81 MG tablet Take 81 mg by mouth at bedtime.   clopidogrel 75 MG tablet Commonly known as:  PLAVIX Take 1 tablet (75 mg total) by mouth daily.   ezetimibe 10 MG tablet Commonly known as:  ZETIA Take 1 tablet (10 mg total) by mouth daily.   FISH OIL PO Take 1,000 mg by mouth daily.   gabapentin 300 MG capsule Commonly known as:  NEURONTIN Take 1 capsule (300 mg total) by mouth 2 (two) times daily.   hydrochlorothiazide 25 MG tablet Commonly known as:  HYDRODIURIL Take 25 mg by mouth at bedtime.   JOINT HEALTH PO Take 1 tablet by mouth daily. [employer]   loratadine 10  MG tablet Commonly known as:  CLARITIN Take 10 mg by mouth at bedtime.   LYCOPENE PO Take 1 capsule by mouth daily. [place] FORMULA LYCO-SORB PROSTATE SUPPORT   metoprolol tartrate 25 MG tablet Commonly known as:  LOPRESSOR Take 1 tablet (25 mg total) by mouth daily.   OCUVITE PRESERVISION PO Take 1 tablet by mouth 2 (two) times daily.   omeprazole 40 MG capsule Commonly known as:  PRILOSEC Take 1 capsule (40 mg total) by mouth daily.   RED YEAST RICE PO Take 1 tablet by mouth 2 (two) times daily. MORNING & AFTERNOON   rosuvastatin 20 MG tablet Commonly known as:  CRESTOR Take 1 tablet (20 mg total) by mouth at bedtime.   UNABLE TO FIND Outpatient physical therapy, occupational therapy, speech therapy  Diagnosis: CVA          Objective:   Physical Exam BP 122/74 (BP Location: Left Arm, Patient Position: Sitting, Cuff Size: Small)   Pulse 71   Temp 98.1 F (36.7 C) (Oral)   Resp 14   Ht 5\' 10"  (1.778 m)   Wt 181 lb 4 oz (82.2 kg)   SpO2 96%   BMI 26.01 kg/m  General:   Well developed, well nourished . NAD.  HEENT:  Normocephalic . Face symmetric, atraumatic Neck: Good carotid pulses Lungs:  CTA B Normal respiratory effort, no intercostal retractions, no accessory muscle use. Heart: RRR,  no murmur.  No pretibial edema bilaterally  Skin: Not pale. Not jaundice Neurologic:  alert & oriented X3.  Speech: No difficulty finding worse today, fluent; gait unassisted, slightly hesitant. Motor and face symmetric DTRs: Symmetric EOMI. Psych--  Cognition and judgment appear intact.  Cooperative with normal attention span and concentration.  Behavior appropriate. No anxious or depressed appearing.      Assessment & Plan:   Assessment Prediabetes HTN Hyperlipidemia, statin intolerant (simva-pravachol: shoulder pain) Anxiety- rarely takes xanax Facial neuropathy dx 2015, no w/u, sx resurface x1 after temporary d/c of meds  CV: --Mitral valve annuloplasty  2006-- needs SBE prophylaxis -- SOB 04-2016, seen at [facility], had a ECHO Nl fx MV, cath: no CAD --Cryptogenic stroke 09/12/2016, had slurred speech. Was recommended a loop recorder.started plavix Elevated PSA and BPH-- Dr. [name] Colon cancer partial colectomy 1990, colonoscopy 08-2007 negative, Cscope again 09-2012 normal, 5 years   PLAN: Stroke?: Sudden onset of dizziness follow-up  by headache and nausea started around 18 hours ago, he has a history of recent stroke, on aspirin and Plavix. Neurological exam is nonfocal. He needs immediate attention. I spoke with the ER doctor (Dr Theora Gianotti) , will send him to [facility]. His  wife is here with him, this was discussed with her. She states can safely transport the patient. Nevertheless, I advised the patient and the wife to notify the ER staff if anything changes before he sees the doctor. Hyperlipidemia: Well-controlled per last FLP, needs LFTs

## 2016-11-30 NOTE — Consult Note (Signed)
Requesting Physician: Dr. Ayesha Rumpf    Chief Complaint: Stroke  History obtained from:  Patient     HPI:                                                                                                                                         Kevin Bass is an 76 y.o. male who has a past medical history of colon cancer, hyperlipidemia, hypertension, recent stroke in July in the left MCA territory with not much in terms of residual deficits, who noted a sudden onset of room spinning and feeling off balance yesterday at around 1400 hours. Due to the symptoms persisting and mild HA he was sent to ED today. HE is feeling better other than mild HA but the room has stopped spinning. It was noticed that patient has a slight left nasal labial fold decrease however the wife looked at him very closely and stated that she believes that his normal facial features. Patient has had a stroke approximately 3 months ago however this affected his speech and has nearly completely resolved at this time.  Patient is on aspirin and Plavix. States that he is taking his medications every day other than yesterday.  Date last known well: Date: 11/29/2016 Time last known well: Time: 14:00 tPA Given: No: out of window Modified Rankin: Rankin Score=0  NIHSS 1  Past Medical History:  Diagnosis Date  . Allergy   . Anxiety   . Arthritis    hands  . Colon cancer [facility])    s/p partial colectomy 1990, Cscope neg 7-09, next 2014  . DISORDER, MITRAL VALVE    MV recontruction at [place] 2006----still needs ABX for SBE prophylaxis   . Elevated PSA    [name]  . GERD (gastroesophageal reflux disease)   . Heart murmur    no problems since surgery  . HYPERGLYCEMIA, BORDERLINE 10/16/2008  . HYPERLIPIDEMIA 06/15/2006   diet controlled  . Hypertension   . HYPERTROPHY PROSTATE W/O UR OBST & OTH LUTS 10/16/2008  . Right groin pain    Dr. Redmond Pulling  . Wears partial dentures    lower partial    Past Surgical History:  Procedure  Laterality Date  . COLECTOMY  1990  . COLONOSCOPY    . HERNIA REPAIR Left 2014   [facility] rep w/mesh- March 2014  . MITRAL VALVE ANNULOPLASTY     [facility] 2006  . TEE WITHOUT CARDIOVERSION N/A 10/05/2016   Procedure: TRANSESOPHAGEAL ECHOCARDIOGRAM (TEE);  Surgeon: [name], MD;  Location: [facility] ENDOSCOPY;  Service: Cardiovascular;  Laterality: N/A;    Family History  Problem Relation Age of Onset  . Colon cancer Other        M side (cousins)  . Diabetes Neg Hx   . Stroke Neg Hx   . CAD Neg Hx   . Prostate cancer Neg Hx   . Rectal cancer Neg Hx   .  Esophageal cancer Neg Hx    Social History:  reports that he quit smoking about 33 years ago. His smoking use included Cigarettes. He has a 10.00 pack-year smoking history. He has never used smokeless tobacco. He reports that he drinks about 4.2 oz of alcohol per week . He reports that he does not use drugs.  Allergies:  Allergies  Allergen Reactions  . Statins Other (See Comments)    Other reaction(s): Myalgias (intolerance)    Medications:                                                                                                                           Current Facility-Administered Medications  Medication Dose Route Frequency Provider Last Rate Last Dose  . iopamidol (ISOVUE-370) 76 % injection            Current Outpatient Prescriptions  Medication Sig Dispense Refill  . ALPRAZolam (XANAX) 0.25 MG tablet Take 1-2 tablets (0.25-0.5 mg total) by mouth 2 (two) times daily as needed for anxiety. 40 tablet 1  . Arginine 500 MG CAPS Take 1,500 mg by mouth daily.    Marland Kitchen aspirin EC 81 MG tablet Take 81 mg by mouth at bedtime.    . clopidogrel (PLAVIX) 75 MG tablet Take 1 tablet (75 mg total) by mouth daily. (Patient taking differently: Take 75 mg by mouth daily at 2 PM. ) 30 tablet 4  . ezetimibe (ZETIA) 10 MG tablet Take 1 tablet (10 mg total) by mouth daily. (Patient taking differently: Take 10 mg by mouth at bedtime. ) 90 tablet 1   . gabapentin (NEURONTIN) 300 MG capsule Take 1 capsule (300 mg total) by mouth 2 (two) times daily. (Patient taking differently: Take 300 mg by mouth at bedtime. ) 60 capsule 6  . Glucosamine-MSM-Hyaluronic Acd (JOINT HEALTH PO) Take 1 tablet by mouth daily. [place]    . hydrochlorothiazide (HYDRODIURIL) 25 MG tablet Take 25 mg by mouth at bedtime.    Marland Kitchen loratadine (CLARITIN) 10 MG tablet Take 10 mg by mouth at bedtime.     Marland Kitchen LYCOPENE PO Take 1 capsule by mouth daily. JARROW FORMULA LYCO-SORB PROSTATE SUPPORT    . metoprolol tartrate (LOPRESSOR) 25 MG tablet Take 1 tablet (25 mg total) by mouth daily.    . Multiple Vitamins-Minerals (OCUVITE PRESERVISION PO) Take 1 tablet by mouth 2 (two) times daily.     . Omega-3 Fatty Acids (FISH OIL PO) Take 1,600 mg by mouth daily.     Marland Kitchen omeprazole (PRILOSEC) 40 MG capsule Take 1 capsule (40 mg total) by mouth daily. (Patient taking differently: Take 40 mg by mouth daily as needed (for acid reflux). ) 90 capsule 3  . Red Yeast Rice Extract (RED YEAST RICE PO) Take 1 tablet by mouth 2 (two) times daily. MORNING & AFTERNOON    . rosuvastatin (CRESTOR) 20 MG tablet Take 1 tablet (20 mg total) by mouth at bedtime. 30 tablet 3  . UNABLE TO  FIND Outpatient physical therapy, occupational therapy, speech therapy  Diagnosis: CVA 1 Mutually Defined 0   ROS:                                                                                                                                       History obtained from the patient General ROS: negative for - chills, fatigue, fever, night sweats, weight gain or weight loss Psychological ROS: negative for - behavioral disorder, hallucinations, memory difficulties, mood swings or suicidal ideation Ophthalmic ROS: negative for - blurry vision, double vision, eye pain or loss of vision ENT ROS: negative for - epistaxis, nasal discharge, oral lesions, sore throat, tinnitus or vertigo Allergy and Immunology ROS:  negative for - hives or itchy/watery eyes Hematological and Lymphatic ROS: negative for - bleeding problems, bruising or swollen lymph nodes Endocrine ROS: negative for - galactorrhea, hair pattern changes, polydipsia/polyuria or temperature intolerance Respiratory ROS: negative for - cough, hemoptysis, shortness of breath or wheezing Cardiovascular ROS: negative for - chest pain, dyspnea on exertion, edema or irregular heartbeat Gastrointestinal ROS: negative for - abdominal pain, diarrhea, hematemesis, nausea/vomiting or stool incontinence Genito-Urinary ROS: negative for - dysuria, hematuria, incontinence or urinary frequency/urgency Musculoskeletal ROS: negative for - joint swelling or muscular weakness Neurological ROS: as noted in HPI Dermatological ROS: negative for rash and skin lesion changes  Neurologic Examination:                                                                                                      Blood pressure 134/67, pulse 60, temperature 97.7 F (36.5 C), temperature source Oral, resp. rate (!) 23, SpO2 97 %.  HEENT-  Normocephalic, no lesions, without obvious abnormality.  Normal external eye and conjunctiva.  Normal TM's bilaterally.  Normal auditory canals and external ears. Normal external nose, mucus membranes and septum.  Normal pharynx. Cardiovascular- S1, S2 normal, pulses palpable throughout   Lungs- chest clear, no wheezing, rales, normal symmetric air entry Abdomen- normal findings: bowel sounds normal Extremities- no edema Lymph-no adenopathy palpable Musculoskeletal-no joint tenderness, deformity or swelling Skin-warm and dry, no hyperpigmentation, vitiligo, or suspicious lesions  Neurological Examination Mental Status: Alert, oriented, thought content appropriate.  Speech fluent without evidence of aphasia.  Able to follow 3 step commands without difficulty. Cranial Nerves: II:  Visual fields grossly normal,  III,IV, VI: ptosis not present,  extra-ocular motions intact bilaterally, pupils equal, round, reactive to light and accommodation, nystagmus noted at far horizontal gaze however when brought back  to midline and there was no nystagmus V,VII: smile symmetric with resting right nasolabial fold decrease, facial light touch sensation normal bilaterally VIII: hearing normal bilaterally IX,X: uvula rises symmetrically XI: bilateral shoulder shrug XII: midline tongue extension Motor: Right : Upper extremity   5/5    Left:     Upper extremity   5/5  Lower extremity   5/5     Lower extremity   5/5 Tone and bulk:normal tone throughout; no atrophy noted Sensory: Pinprick and light touch intact throughout, bilaterally Deep Tendon Reflexes: 2+ and symmetric throughout Plantars: Right: downgoing   Left: downgoing Cerebellar: normal finger-to-nose,and normal heel-to-shin test Gait: Not tested   Lab Results: Basic Metabolic Panel:  Recent Labs Lab 11/30/16 1059  NA 135  K 3.6  CL 96*  CO2 27  GLUCOSE 113*  BUN 17  CREATININE 0.96  CALCIUM 9.5    Liver Function Tests:  Recent Labs Lab 11/30/16 1059  AST 44*  ALT 47  ALKPHOS 49  BILITOT 1.4*  PROT 7.3  ALBUMIN 4.3   CBC:  Recent Labs Lab 11/30/16 1059  WBC 10.0  NEUTROABS 8.7*  HGB 15.3  HCT 45.8  MCV 83.9  PLT 201    Recent Labs  11/30/16 1059  LABPROT 14.5  INR 1.14  Imaging: Ct Head Wo Contrast  Result Date: 11/30/2016 CLINICAL DATA:  76 year old male status post left MCA infarct 1 month ago. New onset dizziness and headache for 2 days. EXAM: CT HEAD WITHOUT CONTRAST TECHNIQUE: Contiguous axial images were obtained from the base of the skull through the vertex without intravenous contrast. COMPARISON:  Brain MRI and intracranial MRA 09/12/2016. FINDINGS: Brain: Hypodense encephalomalacia corresponding to the posterior left insula and operculum area of restricted diffusion in July (series 3, image 19). The remaining supratentorial gray-white matter  differentiation is normal. Confluent hypodensity throughout the right cerebellar tonsil and lower hemisphere (series 3, image 5). Partial effacement of the fourth ventricle, but overall mild posterior fossa mass effect. Petechial if any associated blood products. No malignant hemorrhagic transformation. The left cerebellar hemisphere is spared. No ventriculomegaly. No midline shift. No extra-axial collection. Vascular: Calcified atherosclerosis at the skull base. Skull: Intact.  No acute osseous abnormality identified. Sinuses/Orbits: Visualized paranasal sinuses and mastoids are stable and well pneumatized. Other: Visualized orbits and scalp soft tissues are within normal limits. IMPRESSION: 1. Acute to subacute Right PICA infarct affecting the cerebellum. Mild regional mass effect. No malignant hemorrhagic transformation. No ventriculomegaly. 2. Expected evolution of the July Left MCA infarct. Electronically Signed   By: Genevie Ann M.D.   On: 11/30/2016 11:33   TEE from 8/14 Findings:  1. LEFT VENTRICLE: The left ventricular wall thickness is mildly increased.  The left ventricular cavity is normal in size. Wall motion is normal.  LVEF is 60-65%.  2. RIGHT VENTRICLE:  The right ventricle is normal in structure and function without any thrombus or masses.    3. LEFT ATRIUM:  The left atrium is normal in size without any thrombus or masses.  There is not spontaneous echo contrast ("smoke") in the left atrium consistent with a low flow state.  4. LEFT ATRIAL APPENDAGE:  The left atrial appendage is surgically ligated. No flow is noted by color doppler in the appendage.   5. ATRIAL SEPTUM:  The atrial septum appears intact and is free of thrombus and/or masses.  There is no evidence for interatrial shunting by color doppler and saline microbubble.  6. RIGHT ATRIUM:  The right atrium  is normal in size and function without any thrombus or masses.  7. MITRAL VALVE:  The mitral valve is noted to have  an annuloplasty ring in place. There is trivial regurgitation.  There were no vegetations or stenosis.  8. AORTIC VALVE:  The aortic valve is trileaflet, normal in structure and function with trivial regurgitation.  There were no vegetations or stenosis  9. TRICUSPID VALVE:  The tricuspid valve is normal in structure and function with trivial regurgitation.  There were no vegetations or stenosis  10.  PULMONIC VALVE:  The pulmonic valve is normal in structure and function with trivial regurgitation.  There were no vegetations or stenosis.   11. AORTIC ARCH, ASCENDING AND DESCENDING AORTA:  There was grade 1 Ron Parker et. Al, 1992) atherosclerosis of the ascending aorta, aortic arch, or proximal descending aorta.  12. PULMONARY VEINS: Anomalous pulmonary venous return was not noted.  13. PERICARDIUM: The pericardium appeared normal and non-thickened.  There is no pericardial effusion.   Assessment and plan discussed with with attending physician and they are in agreement.    Etta Quill PA-C [facility] [phone]  11/30/2016, 1:14 PM   Attending addendum I have seen and examined the patient in the emergency room. I agree with the history and physical documented above. I have reviewed the images independently. Noncontrast CT of the head shows hypodensity in the right PICA territory. This is new from the prior scans. Expected evolution of the left MCA infarct from July.  Assessment: 76 y.o. male presented to the hospital with sudden onset of dizziness and mild headache. CT scan reveals a right PICA infarct.  Currently on exam he is nonfocal other than a left nasolabial fold decrease at rest. He has a reasonable sized infarct, for that reason I would recommend admission to stepdown unit. Not a candidate for TPA as he is outside the window Not a candidate for endovascular intervention is exam is not consistent with any large vessel occlusion.  Impression Right cerebellar  stroke in the PICA territory. Most likely cardioembolic. Old left MCA stroke. This raises more suspicion for the current stroke to be cardiac embolic. Prior workup has been negative, but I suspect that he might have paroxysmal atrial fibrillation.  Stroke Risk Factors - hyperlipidemia and hypertension  Recommend Admit to stepdown unit HgbA1c, fasting lipid panel MRI brain without contrast--with repeat CT of brain in the morning CTA H+N PT consult, OT consult, Speech consult Echocardiogram 80 mg of Atorvistatin Prophylactic therapy-Antiplatelet med: Continue aspirin and Plavix - might need anticoagulation if A. fib is detected. Risk factor modification Telemetry monitoring--and step down unit. Will benefit from Loop recorder-had a recent workup including TEE which was negative. Frequent neuro checks NPO until passes stroke swallow screen  Please page stroke NP  Or  PA  Or MD from 8am -4 pm  as this patient from this time will be  followed by the stroke.   You can look them up on [email or web address]  Password [email or web address]   -- Amie Portland, MD [facility] [phone]  If 7pm to 7am, please call on call as listed on AMION.

## 2016-11-30 NOTE — ED Provider Notes (Signed)
[facility] DEPT Provider Note   CSN: [id number] Arrival date & time: 11/30/16  1048     History   Chief Complaint Chief Complaint  Patient presents with  . Weakness  . Dizziness  . Code Stroke    HPI Kevin Bass is a 76 y.o. male.  The history is provided by the patient. No language interpreter was used.  Weakness  Primary symptoms include dizziness.  Dizziness  Associated symptoms: weakness     Kevin Bass is a 76 y.o. male who presents to the Emergency Department complaining of dizziness, headache.  He presents to the emergency department for evaluation of dizziness and headache since last night. At 2 PM yesterday he went to sit down and eat a sandwich and noted that he was feeling dizzy and off-balance with associated headache, nausea, diaphoresis. He checked his blood pressure was 063 systolic. He called his PCP and was evaluated in the office this morning. He was referred to the emergency department for further evaluation. He reports persistent headache. No vision changes, numbness, weakness. He does have mild nausea. Overall his symptoms are improving. He is taking his Plavix as prescribed.  Past Medical History:  Diagnosis Date  . Allergy   . Anxiety   . Arthritis    hands  . Colon cancer [facility])    s/p partial colectomy 1990, Cscope neg 7-09, next 2014  . DISORDER, MITRAL VALVE    MV recontruction at [place] 2006----still needs ABX for SBE prophylaxis   . Elevated PSA    [name]  . GERD (gastroesophageal reflux disease)   . Heart murmur    no problems since surgery  . HYPERGLYCEMIA, BORDERLINE 10/16/2008  . HYPERLIPIDEMIA 06/15/2006   diet controlled  . Hypertension   . HYPERTROPHY PROSTATE W/O UR OBST & OTH LUTS 10/16/2008  . Right groin pain    Dr. Redmond Pulling  . Wears partial dentures    lower partial    Patient Active Problem List   Diagnosis Date Noted  . Trigeminal neuralgia of right side of face 10/01/2016  . History of mitral valve repair  09/30/2016  . Stroke ([place]) 09/12/2016  . GERD (gastroesophageal reflux disease) 09/22/2015  . PCP NOTES >>>>> 11/08/2014  . Chest pain 06/07/2014  . Facial neuropathy 04/02/2013  . Anxiety 02/26/2013  . Annual physical exam 08/30/2012  . HTN (hypertension) 01/22/2011  . HYPERTROPHY PROSTATE W/O UR OBST & OTH LUTS 10/16/2008  . Hyperglycemia 10/16/2008  . Hyperlipidemia 06/15/2006  . DISORDER, MITRAL VALVE 06/15/2006  . ALLERGIC RHINITIS 06/15/2006  . COLON CANCER, HX OF 06/15/2006    Past Surgical History:  Procedure Laterality Date  . COLECTOMY  1990  . COLONOSCOPY    . HERNIA REPAIR Left 2014   [facility] rep w/mesh- March 2014  . MITRAL VALVE ANNULOPLASTY     [facility] 2006  . TEE WITHOUT CARDIOVERSION N/A 10/05/2016   Procedure: TRANSESOPHAGEAL ECHOCARDIOGRAM (TEE);  Surgeon: [name], MD;  Location: [facility] ENDOSCOPY;  Service: Cardiovascular;  Laterality: N/A;       Home Medications    Prior to Admission medications   Medication Sig Start Date End Date Taking? Authorizing Provider  ALPRAZolam (XANAX) 0.25 MG tablet Take 1-2 tablets (0.25-0.5 mg total) by mouth 2 (two) times daily as needed for anxiety. 11/03/16   Colon Branch, MD  Arginine 500 MG CAPS Take 1,500 mg by mouth daily.    [provider]  aspirin EC 81 MG tablet Take 81 mg by mouth at bedtime.  [provider]  clopidogrel (PLAVIX) 75 MG tablet Take 1 tablet (75 mg total) by mouth daily. Patient taking differently: Take 75 mg by mouth daily at 2 PM.  09/14/16   Rai, Vernelle Emerald, MD  ezetimibe (ZETIA) 10 MG tablet Take 1 tablet (10 mg total) by mouth daily. Patient taking differently: Take 10 mg by mouth at bedtime.  09/07/16   Colon Branch, MD  gabapentin (NEURONTIN) 300 MG capsule Take 1 capsule (300 mg total) by mouth 2 (two) times daily. Patient taking differently: Take 300 mg by mouth at bedtime.  06/17/16   Colon Branch, MD  Glucosamine-MSM-Hyaluronic Acd (JOINT HEALTH PO) Take 1 tablet by mouth  daily. [facility]    [provider]  hydrochlorothiazide (HYDRODIURIL) 25 MG tablet Take 25 mg by mouth at bedtime.    [provider]  loratadine (CLARITIN) 10 MG tablet Take 10 mg by mouth at bedtime.     [provider]  LYCOPENE PO Take 1 capsule by mouth daily. [place] FORMULA LYCO-SORB PROSTATE SUPPORT    [provider]  metoprolol tartrate (LOPRESSOR) 25 MG tablet Take 1 tablet (25 mg total) by mouth daily. 11/10/16   Colon Branch, MD  Multiple Vitamins-Minerals (OCUVITE PRESERVISION PO) Take 1 tablet by mouth 2 (two) times daily.     [provider]  Omega-3 Fatty Acids (FISH OIL PO) Take 1,600 mg by mouth daily.     [provider]  omeprazole (PRILOSEC) 40 MG capsule Take 1 capsule (40 mg total) by mouth daily. Patient taking differently: Take 40 mg by mouth daily as needed (for acid reflux).  07/06/16   Colon Branch, MD  Red Yeast Rice Extract (RED YEAST RICE PO) Take 1 tablet by mouth 2 (two) times daily. MORNING & AFTERNOON    [provider]  rosuvastatin (CRESTOR) 20 MG tablet Take 1 tablet (20 mg total) by mouth at bedtime. 09/27/16   Colon Branch, MD  UNABLE TO FIND Outpatient physical therapy, occupational therapy, speech therapy  Diagnosis: CVA 09/13/16   Mendel Corning, MD    Family History Family History  Problem Relation Age of Onset  . Colon cancer Other        M side (cousins)  . Diabetes Neg Hx   . Stroke Neg Hx   . CAD Neg Hx   . Prostate cancer Neg Hx   . Rectal cancer Neg Hx   . Esophageal cancer Neg Hx     Social History Social History  Substance Use Topics  . Smoking status: Former Smoker    Packs/day: 0.50    Years: 20.00    Types: Cigarettes    Quit date: 02/23/1983  . Smokeless tobacco: Never Used  . Alcohol use 4.2 oz/week    7 Glasses of wine per week     Allergies   Statins   Review of Systems Review of Systems  Neurological: Positive for dizziness and weakness.  All  other systems reviewed and are negative.    Physical Exam Updated Vital Signs BP (!) 157/75 (BP Location: Right Arm)   Pulse 63   Temp 97.7 F (36.5 C) (Oral)   Resp 15   SpO2 98%   Physical Exam  Constitutional: He is oriented to person, place, and time. He appears well-developed and well-nourished.  HENT:  Head: Normocephalic and atraumatic.  Cardiovascular: Normal rate and regular rhythm.   No murmur heard. Pulmonary/Chest: Effort normal and breath sounds normal. No  respiratory distress.  Abdominal: Soft. There is no tenderness. There is no rebound and no guarding.  Musculoskeletal: He exhibits no edema or tenderness.  Neurological: He is alert and oriented to person, place, and time. No cranial nerve deficit. Coordination normal.  5 out of 5 strength in all 4 extremities with sensation to light touch intact in all 4 extremities. Visual fields grossly intact. No pronator drift. No ataxia on finger to nose bilaterally.  Skin: Skin is warm and dry.  Psychiatric: He has a normal mood and affect. His behavior is normal.  Nursing note and vitals reviewed.    ED Treatments / Results  Labs (all labs ordered are listed, but only abnormal results are displayed) Labs Reviewed  DIFFERENTIAL - Abnormal; Notable for the following:       Result Value   Neutro Abs 8.7 (*)    All other components within normal limits  URINALYSIS, ROUTINE W REFLEX MICROSCOPIC - Abnormal; Notable for the following:    Color, Urine AMBER (*)    Ketones, ur 80 (*)    Protein, ur >=300 (*)    Squamous Epithelial / LPF 0-5 (*)    All other components within normal limits  PROTIME-INR  APTT  CBC  RAPID URINE DRUG SCREEN, HOSP PERFORMED  ETHANOL  COMPREHENSIVE METABOLIC PANEL  I-STAT TROPONIN, ED    EKG  EKG Interpretation None       Radiology Ct Head Wo Contrast  Result Date: 11/30/2016 CLINICAL DATA:  76 year old male status post left MCA infarct 1 month ago. New onset dizziness and  headache for 2 days. EXAM: CT HEAD WITHOUT CONTRAST TECHNIQUE: Contiguous axial images were obtained from the base of the skull through the vertex without intravenous contrast. COMPARISON:  Brain MRI and intracranial MRA 09/12/2016. FINDINGS: Brain: Hypodense encephalomalacia corresponding to the posterior left insula and operculum area of restricted diffusion in July (series 3, image 19). The remaining supratentorial gray-white matter differentiation is normal. Confluent hypodensity throughout the right cerebellar tonsil and lower hemisphere (series 3, image 5). Partial effacement of the fourth ventricle, but overall mild posterior fossa mass effect. Petechial if any associated blood products. No malignant hemorrhagic transformation. The left cerebellar hemisphere is spared. No ventriculomegaly. No midline shift. No extra-axial collection. Vascular: Calcified atherosclerosis at the skull base. Skull: Intact.  No acute osseous abnormality identified. Sinuses/Orbits: Visualized paranasal sinuses and mastoids are stable and well pneumatized. Other: Visualized orbits and scalp soft tissues are within normal limits. IMPRESSION: 1. Acute to subacute Right PICA infarct affecting the cerebellum. Mild regional mass effect. No malignant hemorrhagic transformation. No ventriculomegaly. 2. Expected evolution of the July Left MCA infarct. Electronically Signed   By: Genevie Ann M.D.   On: 11/30/2016 11:33    Procedures Procedures (including critical care time)  Medications Ordered in ED Medications - No data to display   Initial Impression / Assessment and Plan / ED Course  I have reviewed the triage vital signs and the nursing notes.  Pertinent labs & imaging results that were available during my care of the patient were reviewed by me and considered in my medical decision making (see chart for details).     Pt with hx/o recent CVA here with dizziness since 2pm yesterday.  CT head with acute/subacute cerebellar  infarct. No focal deficits on neuro exam at this time.  D/w Neurology - will see in consult, recommends CTA head/neck, MRI.  Medicine consulted for admission.  Patient updated of findings of studies and is in agreement  with plan.    Final Clinical Impressions(s) / ED Diagnoses   Final diagnoses:  None    New Prescriptions New Prescriptions   No medications on file     Quintella Reichert, MD 12/01/16 [phone]

## 2016-11-30 NOTE — Patient Instructions (Signed)
Please go to the [facility] emergency room  I already  talk with the doctor over there  I suspect you may had a stroke yesterday and you need prompt evaluation today.  If anything changes between now and the time ypou arrive to the emergency room, please talk with the nurse over there.

## 2016-11-30 NOTE — ED Notes (Signed)
ED Provider at bedside. 

## 2016-11-30 NOTE — Progress Notes (Signed)
  Echocardiogram 2D Echocardiogram has been performed.  Kevin Bass 11/30/2016, 3:35 PM

## 2016-11-30 NOTE — Progress Notes (Signed)
Received report from ED nurse. Completed baseline NIH and Neuro exam at 1500. Patient transported down to MRI at 1600.

## 2016-11-30 NOTE — ED Triage Notes (Signed)
Yesterday afternoon about 2 am ,  got up from chair and he got dizzy, started to sweat and took his bp  And it was high , called his dr and was told to come to dr today and he did and now sent for further tests,  Has had a h/a last night  Took motrin  And got nauseated this am , wife states speech is not slurred but  Slowed , . Has  Good neuro , no weakness on either side, still has h/a

## 2016-11-30 NOTE — ED Notes (Signed)
Pt ambulated to room from waiting room, tolerated well. No complaints of dizziness.

## 2016-12-01 ENCOUNTER — Inpatient Hospital Stay (HOSPITAL_COMMUNITY): Payer: PPO

## 2016-12-01 ENCOUNTER — Encounter (HOSPITAL_COMMUNITY)
Admission: EM | Disposition: A | Discharge status: Home / Self Care | Payer: Self-pay | Source: Home / Self Care | Attending: Internal Medicine

## 2016-12-01 ENCOUNTER — Encounter (HOSPITAL_COMMUNITY): Payer: Self-pay | Admitting: Radiology

## 2016-12-01 DIAGNOSIS — I059 Rheumatic mitral valve disease, unspecified: Secondary | ICD-10-CM

## 2016-12-01 DIAGNOSIS — I639 Cerebral infarction, unspecified: Secondary | ICD-10-CM

## 2016-12-01 DIAGNOSIS — I10 Essential (primary) hypertension: Secondary | ICD-10-CM

## 2016-12-01 DIAGNOSIS — E7849 Other hyperlipidemia: Secondary | ICD-10-CM

## 2016-12-01 HISTORY — PX: LOOP RECORDER INSERTION: EP1214

## 2016-12-01 LAB — ECHOCARDIOGRAM COMPLETE
Height: 70 in
WEIGHTICAEL: 2900 [oz_av]

## 2016-12-01 SURGERY — LOOP RECORDER INSERTION

## 2016-12-01 MED ORDER — LIDOCAINE-EPINEPHRINE 1 %-1:100000 IJ SOLN
INTRAMUSCULAR | Status: AC
  Filled 2016-12-01: qty 1

## 2016-12-01 MED ORDER — IOPAMIDOL (ISOVUE-300) INJECTION 61%
INTRAVENOUS | Status: AC
  Administered 2016-12-01: 100 mL
  Filled 2016-12-01: qty 100

## 2016-12-01 MED ORDER — IOPAMIDOL (ISOVUE-300) INJECTION 61%
INTRAVENOUS | Status: AC
  Filled 2016-12-01: qty 30

## 2016-12-01 MED ORDER — LIDOCAINE-EPINEPHRINE 1 %-1:100000 IJ SOLN
INTRAMUSCULAR | Status: DC | PRN
  Administered 2016-12-01: 10 mL

## 2016-12-01 MED ORDER — OXYCODONE HCL 5 MG PO TABS
5.0000 mg | ORAL_TABLET | ORAL | Status: DC | PRN
  Administered 2016-12-01: 5 mg via ORAL
  Filled 2016-12-01: qty 1

## 2016-12-01 MED ORDER — ACETAMINOPHEN 325 MG PO TABS: 325.0000 mg | ORAL_TABLET | ORAL | Status: DC | PRN

## 2016-12-01 MED ORDER — ONDANSETRON HCL 4 MG/2ML IJ SOLN: 4.0000 mg | Freq: Four times a day (QID) | INTRAMUSCULAR | Status: DC | PRN

## 2016-12-01 MED ORDER — PNEUMOCOCCAL VAC POLYVALENT 25 MCG/0.5ML IJ INJ: 0.5000 mL | INJECTION | INTRAMUSCULAR | Status: DC

## 2016-12-01 SURGICAL SUPPLY — 2 items
LOOP REVEAL LINQSYS (Prosthesis & Implant Heart) ×2 IMPLANT
PACK LOOP INSERTION (CUSTOM PROCEDURE TRAY) ×3 IMPLANT

## 2016-12-01 NOTE — Progress Notes (Addendum)
PROGRESS NOTE    Kevin Bass  OHY:[id number] DOB: 24-Mar-1940 DOA: 11/30/2016 PCP: Colon Branch, MD   Brief Narrative:  76 y.o. WM PMHx anxiety, stroke 3 months ago, HTN, MV Disorder, HLD,Hyperglycemia. Hx Colon cancer   Presents to emergency department from his primary care provider's office chief complaint of dizziness. PCP was concerned about stroke so patient presented to the emergency department. [facility] are asked to admit.   Information is obtained from the patient and his wife is at the bedside. He states he was in his usual state of health until about 2:00 yesterday when he suddenly became very dizzy. He describes the feeling as "spinning". He states his gait became unsteady had hold onto the walls and furniture ultimately he did fall. He denies hitting his head or losing consciousness. Associated symptoms include mild shortness of breath and some diaphoresis. He then developed a frontal headache and some nausea. He denies chest pain palpitations abdominal pain vomiting. He reports he called the on-call service who recommended that he see his PCP next day. He reports he slept well through the night workup about 6 AM but he still had a headache. He reports he took Motrin with some improvement.   Subjective: 10/10 A/O 4, negative CP, negative SOB, negative headache, negative blurred vision, negative slurred speech, negative abdominal pain, negative N/V.     Assessment & Plan:   Principal Problem:   Ischemic stroke [facility]) Active Problems:   Hyperlipidemia   Mitral valve disease   HTN (hypertension)   GERD (gastroesophageal reflux disease)   CVA -patient with previous cryptogenic stroke July 2018 with residual slurred speech(slurred speech resolved) -CT head/MRI brain/CTA: Positive Acute RIGHT PICA territory infarct, and Remote LEFT MCA branch infarct see results below  -Echocardiogram pending -10/9 Hemoglobin A1c= 5.5 not consistent with  prediabetes/diabetes -Management per stroke team -Stroke team is requested that cardiology place loop recorder. Have consulted Cardiology, requesting loop recorder placement await recommendation -Continue Plavix and aspirin -PT and OT consult -Continue Plavix and aspirin  Hx Colon Cancer -CT abdomen pelvis pending -CT chest pending   Essential HTN -Controlled without medication  MV disease -S/P Annuloplasty 2006 -Continue Plavix and aspirin   HLD  -10/9 Lipid panel: Within ADA guidelines -Crestor 20 mg daily  -Zetia 10 mg daily      DVT prophylaxis: Plavix and aspirin Code Status: full Family Communication: wife present at bedside for discussion of plan of care Disposition Plan: TBD. Awaiting cardiology consult   Consultants:  Neurology   Procedures/Significant Events 10/9 CT Head WO contrast:Acute to subacute Right PICA infarct affecting the cerebellum. Mild regional mass effect. No malignant hemorrhagic transformation. No ventriculomegaly. 2. Expected evolution of the July Left MCA infarct. 10/9 CT Angiogram Neck/Head W/WO contrast:Negative for emergent large vessel occlusion, and no large vessel stenosis identified. 2. The proximal Right PICA remains patent, and there is no definite acute abnormality of the non-dominant Right Vertebral Artery. 3. The left vertebral artery is dominant. The anterior and posterior circulation appear stable since the July MRA. There is minimal for age carotid atherosclerosis. 4. Stable CT appearance of right PICA territory infarct from earlier today with no hemorrhage or significant mass effect. 10/9 MRI brain:Acute right PICA territory infarct with mild petechial hemorrhage. 2. Remote left MCA branch infarct 10/10 Echocardiogram: LVEF= 55%- 60%.     I have personally reviewed and interpreted all radiology studies and my findings are as above.  VENTILATOR  SETTINGS: None   Cultures   Antimicrobials:  Devices None   LINES / TUBES:  None    Continuous Infusions:   Objective: Vitals:   12/01/16 0415 12/01/16 0844 12/01/16 0845 12/01/16 1321  BP: 131/85 125/72  130/77  Pulse: 66 68 64 77  Resp: 14 13 15 16   Temp: 97.7 F (36.5 C) (!) 97.3 F (36.3 C)  98.3 F (36.8 C)  TempSrc: Oral Oral  Oral  SpO2: 93% 97% 96% 99%  Weight: 178 lb (80.7 kg)     Height: 5\' 10"  (1.778 m)       Intake/Output Summary (Last 24 hours) at 12/01/16 1512 Last data filed at 12/01/16 1322  Gross per 24 hour  Intake              360 ml  Output                0 ml  Net              360 ml   Filed Weights   11/30/16 1500 12/01/16 0415  Weight: 178 lb 9.2 oz (81 kg) 178 lb (80.7 kg)    Examination:  General: A/O 4,No acute respiratory distress Neck:  Negative scars, masses, torticollis, lymphadenopathy, JVD Lungs: Clear to auscultation bilaterally without wheezes or crackles Cardiovascular: Regular rate and rhythm without murmur gallop or rub normal S1 and S2 Abdomen: negative abdominal pain, nondistended, positive soft, bowel sounds, no rebound, no ascites, no appreciable mass Extremities: No significant cyanosis, clubbing, or edema bilateral lower extremities Skin: Negative rashes, lesions, ulcers Psychiatric:  Negative depression, negative anxiety, negative fatigue, negative mania  Central nervous system:  Cranial nerves II through XII intact, tongue/uvula midline, all extremities muscle strength 5/5, sensation intact throughout, finger nose finger bilateral within normal limits, quick finger touch bilateral within normal limits, heel to shin bilateral within normal limits,  negative dysarthria, negative expressive aphasia, negative receptive aphasia.  .     Data Reviewed: Care during the described time interval was provided by me .  I have reviewed this patient's available data, including medical history, events of note, physical  examination, and all test results as part of my evaluation.   CBC:  Recent Labs Lab 11/30/16 1059  WBC 10.0  NEUTROABS 8.7*  HGB 15.3  HCT 45.8  MCV 83.9  PLT 161   Basic Metabolic Panel:  Recent Labs Lab 11/30/16 1059  NA 135  K 3.6  CL 96*  CO2 27  GLUCOSE 113*  BUN 17  CREATININE 0.96  CALCIUM 9.5   GFR: Estimated Creatinine Clearance: 65.5 mL/min (by C-G formula based on SCr of 0.96 mg/dL). Liver Function Tests:  Recent Labs Lab 11/30/16 1059  AST 44*  ALT 47  ALKPHOS 49  BILITOT 1.4*  PROT 7.3  ALBUMIN 4.3   No results for input(s): LIPASE, AMYLASE in the last 168 hours. No results for input(s): AMMONIA in the last 168 hours. Coagulation Profile:  Recent Labs Lab 11/30/16 1059  INR 1.14   Cardiac Enzymes: No results for input(s): CKTOTAL, CKMB, CKMBINDEX, TROPONINI in the last 168 hours. BNP (last 3 results) No results for input(s): PROBNP in the last 8760 hours. HbA1C:  Recent Labs  11/30/16 1059  HGBA1C 5.5   CBG: No results for input(s): GLUCAP in the last 168 hours. Lipid Profile:  Recent Labs  11/30/16 1059  CHOL 119  HDL 44  LDLCALC 60  TRIG 76  CHOLHDL 2.7   Thyroid Function Tests: No results for input(s): TSH, T4TOTAL, FREET4, T3FREE, THYROIDAB in  the last 72 hours. Anemia Panel: No results for input(s): VITAMINB12, FOLATE, FERRITIN, TIBC, IRON, RETICCTPCT in the last 72 hours. Urine analysis:    Component Value Date/Time   COLORURINE AMBER (A) 11/30/2016 1109   APPEARANCEUR CLEAR 11/30/2016 1109   LABSPEC 1.027 11/30/2016 1109   PHURINE 5.0 11/30/2016 1109   GLUCOSEU NEGATIVE 11/30/2016 1109   HGBUR NEGATIVE 11/30/2016 1109   BILIRUBINUR NEGATIVE 11/30/2016 1109   KETONESUR 80 (A) 11/30/2016 1109   PROTEINUR >=300 (A) 11/30/2016 1109   NITRITE NEGATIVE 11/30/2016 1109   LEUKOCYTESUR NEGATIVE 11/30/2016 1109   Sepsis Labs: @LABRCNTIP (procalcitonin:4,lacticidven:4)  ) Recent Results (from the past 240  hour(s))  MRSA PCR Screening     Status: None   Collection Time: 11/30/16  2:49 PM  Result Value Ref Range Status   MRSA by PCR NEGATIVE NEGATIVE Final    Comment:        The GeneXpert MRSA Assay (FDA approved for NASAL specimens only), is one component of a comprehensive MRSA colonization surveillance program. It is not intended to diagnose MRSA infection nor to guide or monitor treatment for MRSA infections.          Radiology Studies: Ct Angio Head W Or Wo Contrast  Result Date: 11/30/2016 CLINICAL DATA:  76 year old male with right PICA infarct detected on CT today performed for 2 days of dizziness and headache. History of Left MCA infarct in July. EXAM: CT ANGIOGRAPHY HEAD AND NECK TECHNIQUE: Multidetector CT imaging of the head and neck was performed using the standard protocol during bolus administration of intravenous contrast. Multiplanar CT image reconstructions and MIPs were obtained to evaluate the vascular anatomy. Carotid stenosis measurements (when applicable) are obtained utilizing NASCET criteria, using the distal internal carotid diameter as the denominator. CONTRAST:  50 mL Isovue 370 COMPARISON:  Head CT without contrast 1121 hours today. Brain MRI and intracranial MRA 09/12/2016. FINDINGS: CTA NECK Skeleton: Previous sternotomy. Chronic disc and endplate degeneration throughout the cervical spine. No acute osseous abnormality identified. Visualized paranasal sinuses and mastoids are stable and well pneumatized. Upper chest: Negative lung parenchyma. No superior mediastinal lymphadenopathy. Other neck: Negative.  No cervical lymphadenopathy. Aortic arch: 3 vessel arch configuration. Mild for age arch atherosclerosis. Right carotid system: Negative, minimal atherosclerosis. Left carotid system: Negative, minimal atherosclerosis. Vertebral arteries: No proximal right subclavian atherosclerosis. There is calcified the right vertebral artery origin, but no significant  stenosis) series 9 image 68). Non dominant right vertebral artery is intermittently tortuous but patent to the skullbase without stenosis. Mild proximal left subclavian artery atherosclerosis without stenosis. Dominant left vertebral artery with widely patent origin (series 5, image 151). Ectatic left vertebral artery is widely patent to the skullbase. CTA HEAD Posterior circulation: On the July MRA of the distal right vertebral artery was non dominant, and the right vertebral V4 segment today has a similar appearance. Mild if any interval irregularity of the distal right vertebral. Also, the right PICA origin and proximal segment do remain patent (series 7, image 77). The dominant distal left vertebral artery is widely patent to the vertebrobasilar junction with mild tortuosity. Patent basilar artery without stenosis. Ectasia of the basilar tip with patent SCA and PCA origins. Tortuous P1 segments. Posterior communicating arteries are diminutive or absent. Normal bilateral PCA branches. Anterior circulation: Patent mildly ectatic bilateral ICA siphons. Mild calcified siphon atherosclerosis with no stenosis. Normal ophthalmic artery origins. Patent carotid termini. Normal MCA and ACA origins. Tortuous A1 segments with azygos ACA A2 anatomy re- demonstrated. Visible ACA branches  are within normal limits. Left MCA M1 segment tortuosity with early bifurcation again noted. Left MCA branches are within normal limits. Right MCA M1 segment is tortuous. Right MCA bifurcation and right MCA branches are within normal limits. Venous sinuses: Patent on the delayed images. Anatomic variants: Dominant left vertebral artery. Azygos type ACA A2 anatomy. Delayed phase: Continued cytotoxic edema in the inferior left cerebellum. No abnormal enhancement identified. Stable mild posterior fossa mass effect with no ventriculomegaly. No acute intracranial hemorrhage identified. Stable gray-white matter differentiation throughout the brain.  Review of the MIP images confirms the above findings IMPRESSION: 1. Negative for emergent large vessel occlusion, and no large vessel stenosis identified. 2. The proximal Right PICA remains patent, and there is no definite acute abnormality of the non-dominant Right Vertebral Artery. 3. The left vertebral artery is dominant. The anterior and posterior circulation appear stable since the July MRA. There is minimal for age carotid atherosclerosis. 4. Stable CT appearance of right PICA territory infarct from earlier today with no hemorrhage or significant mass effect. Electronically Signed   By: Genevie Ann M.D.   On: 11/30/2016 14:06   Dg Chest 2 View  Result Date: 11/30/2016 CLINICAL DATA:  Stroke. EXAM: CHEST  2 VIEW COMPARISON:  Chest x-ray 09/14/2016. FINDINGS: Prior cardiac valve replacement. Heart size normal. No pulmonary venous congestion. Mild left base subsegmental atelectasis. No focal alveolar infiltrate. No pleural effusion or pneumothorax. IMPRESSION: 1.  Prior cardiac valve replacement.  Heart size normal. 2. Mild left base subsegmental atelectasis. Electronically Signed   By: Marcello Moores  Register   On: 11/30/2016 14:23   Ct Head Wo Contrast  Result Date: 11/30/2016 CLINICAL DATA:  76 year old male status post left MCA infarct 1 month ago. New onset dizziness and headache for 2 days. EXAM: CT HEAD WITHOUT CONTRAST TECHNIQUE: Contiguous axial images were obtained from the base of the skull through the vertex without intravenous contrast. COMPARISON:  Brain MRI and intracranial MRA 09/12/2016. FINDINGS: Brain: Hypodense encephalomalacia corresponding to the posterior left insula and operculum area of restricted diffusion in July (series 3, image 19). The remaining supratentorial gray-white matter differentiation is normal. Confluent hypodensity throughout the right cerebellar tonsil and lower hemisphere (series 3, image 5). Partial effacement of the fourth ventricle, but overall mild posterior fossa mass  effect. Petechial if any associated blood products. No malignant hemorrhagic transformation. The left cerebellar hemisphere is spared. No ventriculomegaly. No midline shift. No extra-axial collection. Vascular: Calcified atherosclerosis at the skull base. Skull: Intact.  No acute osseous abnormality identified. Sinuses/Orbits: Visualized paranasal sinuses and mastoids are stable and well pneumatized. Other: Visualized orbits and scalp soft tissues are within normal limits. IMPRESSION: 1. Acute to subacute Right PICA infarct affecting the cerebellum. Mild regional mass effect. No malignant hemorrhagic transformation. No ventriculomegaly. 2. Expected evolution of the July Left MCA infarct. Electronically Signed   By: Genevie Ann M.D.   On: 11/30/2016 11:33   Ct Angio Neck W And/or Wo Contrast  Result Date: 11/30/2016 CLINICAL DATA:  76 year old male with right PICA infarct detected on CT today performed for 2 days of dizziness and headache. History of Left MCA infarct in July. EXAM: CT ANGIOGRAPHY HEAD AND NECK TECHNIQUE: Multidetector CT imaging of the head and neck was performed using the standard protocol during bolus administration of intravenous contrast. Multiplanar CT image reconstructions and MIPs were obtained to evaluate the vascular anatomy. Carotid stenosis measurements (when applicable) are obtained utilizing NASCET criteria, using the distal internal carotid diameter as the denominator. CONTRAST:  50 mL Isovue 370 COMPARISON:  Head CT without contrast 1121 hours today. Brain MRI and intracranial MRA 09/12/2016. FINDINGS: CTA NECK Skeleton: Previous sternotomy. Chronic disc and endplate degeneration throughout the cervical spine. No acute osseous abnormality identified. Visualized paranasal sinuses and mastoids are stable and well pneumatized. Upper chest: Negative lung parenchyma. No superior mediastinal lymphadenopathy. Other neck: Negative.  No cervical lymphadenopathy. Aortic arch: 3 vessel arch  configuration. Mild for age arch atherosclerosis. Right carotid system: Negative, minimal atherosclerosis. Left carotid system: Negative, minimal atherosclerosis. Vertebral arteries: No proximal right subclavian atherosclerosis. There is calcified the right vertebral artery origin, but no significant stenosis) series 9 image 68). Non dominant right vertebral artery is intermittently tortuous but patent to the skullbase without stenosis. Mild proximal left subclavian artery atherosclerosis without stenosis. Dominant left vertebral artery with widely patent origin (series 5, image 151). Ectatic left vertebral artery is widely patent to the skullbase. CTA HEAD Posterior circulation: On the July MRA of the distal right vertebral artery was non dominant, and the right vertebral V4 segment today has a similar appearance. Mild if any interval irregularity of the distal right vertebral. Also, the right PICA origin and proximal segment do remain patent (series 7, image 77). The dominant distal left vertebral artery is widely patent to the vertebrobasilar junction with mild tortuosity. Patent basilar artery without stenosis. Ectasia of the basilar tip with patent SCA and PCA origins. Tortuous P1 segments. Posterior communicating arteries are diminutive or absent. Normal bilateral PCA branches. Anterior circulation: Patent mildly ectatic bilateral ICA siphons. Mild calcified siphon atherosclerosis with no stenosis. Normal ophthalmic artery origins. Patent carotid termini. Normal MCA and ACA origins. Tortuous A1 segments with azygos ACA A2 anatomy re- demonstrated. Visible ACA branches are within normal limits. Left MCA M1 segment tortuosity with early bifurcation again noted. Left MCA branches are within normal limits. Right MCA M1 segment is tortuous. Right MCA bifurcation and right MCA branches are within normal limits. Venous sinuses: Patent on the delayed images. Anatomic variants: Dominant left vertebral artery. Azygos  type ACA A2 anatomy. Delayed phase: Continued cytotoxic edema in the inferior left cerebellum. No abnormal enhancement identified. Stable mild posterior fossa mass effect with no ventriculomegaly. No acute intracranial hemorrhage identified. Stable gray-white matter differentiation throughout the brain. Review of the MIP images confirms the above findings IMPRESSION: 1. Negative for emergent large vessel occlusion, and no large vessel stenosis identified. 2. The proximal Right PICA remains patent, and there is no definite acute abnormality of the non-dominant Right Vertebral Artery. 3. The left vertebral artery is dominant. The anterior and posterior circulation appear stable since the July MRA. There is minimal for age carotid atherosclerosis. 4. Stable CT appearance of right PICA territory infarct from earlier today with no hemorrhage or significant mass effect. Electronically Signed   By: Genevie Ann M.D.   On: 11/30/2016 14:06   Mr Brain Wo Contrast  Result Date: 11/30/2016 CLINICAL DATA:  Focal neuro deficit. EXAM: MRI HEAD WITHOUT CONTRAST TECHNIQUE: Multiplanar, multiecho pulse sequences of the brain and surrounding structures were obtained without intravenous contrast. COMPARISON:  09/12/2016 FINDINGS: Brain: Confluent restricted diffusion in the inferior right cerebellum. Mild petechial hemorrhage along the inferior right infarct. Fourth ventricle remains patent. No hydrocephalus. Band like facilitated diffusion in the posterior left frontal lobe where there was infarct July 2018 and gliosis on other sequences. Aside from the above described infarcts the brain has a normal appearance for age with no unexpected volume loss or white matter disease. No hydrocephalus, masslike  finding, or extra-axial collection. Vascular: CTA performed earlier today. No detected change since that study. Skull and upper cervical spine: Negative for marrow lesion. Sinuses/Orbits: No significant finding. IMPRESSION: 1. Acute right  PICA territory infarct with mild petechial hemorrhage. 2. Remote left MCA branch infarct. Electronically Signed   By: Monte Fantasia M.D.   On: 11/30/2016 20:11        Scheduled Meds: . aspirin EC  81 mg Oral QHS  . clopidogrel  75 mg Oral Q1400  . ezetimibe  10 mg Oral QHS  . gabapentin  300 mg Oral QHS  . iopamidol      . loratadine  10 mg Oral QHS  . pantoprazole  80 mg Oral Daily  . rosuvastatin  20 mg Oral QHS   Continuous Infusions:   LOS: 1 day    Time spent: 40 minutes    WOODS, Geraldo Docker, MD [facility] Pager [phone]   If 7PM-7AM, please contact night-coverage [email or web address] Password [email or web address] 12/01/2016, 3:12 PM

## 2016-12-01 NOTE — H&P (View-Only) (Signed)
ELECTROPHYSIOLOGY CONSULT NOTE  Patient ID: Kevin Bass MRN: [id number], DOB/AGE: Sep 25, 1940   Admit date: 11/30/2016 Date of Consult: 12/01/2016  Primary Physician: Colon Branch, MD Primary Cardiologist: Dr. Debara Pickett Reason for Consultation: Cryptogenic stroke ; recommendations regarding Implantable Loop Recorder  History of Present Illness Kevin Bass was admitted on 11/30/2016 with acute stroke, symptoms of gait instability and vertigo-like symptoms.  He first developed symptoms while at home. PMHx includes MV repair remotely, HTN, HLD, colon cancer treated surgically, and prior stroke. He was admitted on 09/12/16 with stroke, presented with expressive aphasia. Imaging demonstrated acute infarct left posterior insula and parietal operculum felt to be embolic 2/2 unknown source.  He underwent stroke w/u recommended to have TEE/ILR consultation, though unable to complete TEE 2/2 equipment issues and ultimately preferred to f/u with his cardiologist for guidance declining TEE/ILR.  He was seen out patient by Dr. Debara Pickett afterwards, and in 10/01/16 underwent TEE, noting LAA ligated, the patient wanted to evaluate further data and declined loop at that time.  This admission, Imaging demonstrated per neurology: left PICA infarct, embolic secondary to unclear source. Along with his recent left MCA stroke, concerning for cardioembolic source. Need loop recorder placement.   he has undergone workup for stroke including echocardiogram and carotid angio.  Prior TEE with his last stroke was neg for PFO or thrombus noting LAA was surgically occluded  The patient has been monitored on telemetry which has demonstrated sinus rhythm with no arrhythmias.    Echocardiogram this admission demonstrated.  11/30/16: TTE Study Conclusions - Left ventricle: The cavity size was normal. Systolic function was   normal. The estimated ejection fraction was in the range of 55%   to 60%. Wall motion was normal; there  were no regional wall   motion abnormalities. - Aortic valve: Trileaflet; mildly thickened, mildly calcified   leaflets. - Mitral valve: Calcified annulus. Valve area by pressure   half-time: 1.82 cm^2. - Left atrium: The atrium was mildly dilated. - Pulmonic valve: There was mild regurgitation.   10/05/16: TEE IMPRESSION:  1. Surgically ligated LAA without flow detectable by color doppler 2. Negative for PFO 3. Stable mitral valve annuloplasty ring and repair with trivial MR 4. LVEF 60-65% with normal wall motion    Lab work is reviewed.  Prior to admission, the patient denies chest pain, shortness of breath, dizziness, palpitations, or syncope.  They are recovering from their stroke with plans to home at discharge.  EP has been asked to evaluate again for placement of an implantable loop recorder to monitor for atrial fibrillation from Dr. Erlinda Hong.     Past Medical History:  Diagnosis Date  . Allergy   . Anxiety   . Arthritis    hands  . Colon cancer [facility])    s/p partial colectomy 1990, Cscope neg 7-09, next 2014  . DISORDER, MITRAL VALVE    MV recontruction at [place] 2006----still needs ABX for SBE prophylaxis   . Elevated PSA    [name]  . GERD (gastroesophageal reflux disease)   . Heart murmur    no problems since surgery  . HYPERGLYCEMIA, BORDERLINE 10/16/2008  . HYPERLIPIDEMIA 06/15/2006   diet controlled  . Hypertension   . HYPERTROPHY PROSTATE W/O UR OBST & OTH LUTS 10/16/2008  . Right groin pain    Dr. Redmond Pulling  . Wears partial dentures    lower partial     Surgical History:  Past Surgical History:  Procedure Laterality Date  . COLECTOMY  1990  .  COLONOSCOPY    . HERNIA REPAIR Left 2014   [facility] rep w/mesh- March 2014  . MITRAL VALVE ANNULOPLASTY     [facility] 2006  . TEE WITHOUT CARDIOVERSION N/A 10/05/2016   Procedure: TRANSESOPHAGEAL ECHOCARDIOGRAM (TEE);  Surgeon: [name], MD;  Location: [facility] ENDOSCOPY;  Service: Cardiovascular;  Laterality: N/A;      Prescriptions Prior to Admission  Medication Sig Dispense Refill Last Dose  . ALPRAZolam (XANAX) 0.25 MG tablet Take 1-2 tablets (0.25-0.5 mg total) by mouth 2 (two) times daily as needed for anxiety. 40 tablet 1 Past Month at Unknown time  . Arginine 500 MG CAPS Take 1,500 mg by mouth daily.   11/29/2016 at Unknown time  . aspirin EC 81 MG tablet Take 81 mg by mouth at bedtime.   11/29/2016 at Unknown time  . clopidogrel (PLAVIX) 75 MG tablet Take 1 tablet (75 mg total) by mouth daily. (Patient taking differently: Take 75 mg by mouth daily at 2 PM. ) 30 tablet 4 11/29/2016 at Unknown time  . ezetimibe (ZETIA) 10 MG tablet Take 1 tablet (10 mg total) by mouth daily. (Patient taking differently: Take 10 mg by mouth at bedtime. ) 90 tablet 1 11/29/2016 at Unknown time  . gabapentin (NEURONTIN) 300 MG capsule Take 1 capsule (300 mg total) by mouth 2 (two) times daily. 60 capsule 6 11/29/2016 at Unknown time  . Glucosamine-MSM-Hyaluronic Acd (JOINT HEALTH PO) Take 1 tablet by mouth daily. [place]   11/29/2016 at Unknown time  . hydrochlorothiazide (HYDRODIURIL) 25 MG tablet Take 25 mg by mouth at bedtime.   11/29/2016 at Unknown time  . loratadine (CLARITIN) 10 MG tablet Take 10 mg by mouth at bedtime.    11/29/2016 at Unknown time  . LYCOPENE PO Take 1 capsule by mouth daily. JARROW FORMULA LYCO-SORB PROSTATE SUPPORT   11/29/2016 at Unknown time  . metoprolol tartrate (LOPRESSOR) 25 MG tablet Take 1 tablet (25 mg total) by mouth daily.   11/29/2016 at am  . Multiple Vitamins-Minerals (OCUVITE PRESERVISION PO) Take 1 tablet by mouth 2 (two) times daily.    11/29/2016 at Unknown time  . Omega-3 Fatty Acids (FISH OIL PO) Take 1,000 mg by mouth daily.    11/29/2016 at Unknown time  . omeprazole (PRILOSEC) 40 MG capsule Take 1 capsule (40 mg total) by mouth daily. (Patient taking differently: Take 40 mg by mouth daily as needed (for acid reflux). ) 90 capsule 3 Past Month at Unknown time  . Red Yeast  Rice Extract (RED YEAST RICE PO) Take 1 tablet by mouth 2 (two) times daily. MORNING & AFTERNOON   11/29/2016 at Unknown time  . rosuvastatin (CRESTOR) 20 MG tablet Take 1 tablet (20 mg total) by mouth at bedtime. 30 tablet 3 11/29/2016 at Unknown time  . UNABLE TO FIND Outpatient physical therapy, occupational therapy, speech therapy  Diagnosis: CVA 1 Mutually Defined 0 Taking    Inpatient Medications:  . aspirin EC  81 mg Oral QHS  . clopidogrel  75 mg Oral Q1400  . ezetimibe  10 mg Oral QHS  . gabapentin  300 mg Oral QHS  . iopamidol      . loratadine  10 mg Oral QHS  . pantoprazole  80 mg Oral Daily  . rosuvastatin  20 mg Oral QHS    Allergies:  Allergies  Allergen Reactions  . Statins Other (See Comments)    Myalgias (intolerance)    Social History   Social History  . Marital  status: Married    Spouse name: suzanne  . Number of children: 2  . Years of education: college   Occupational History  . retired     Social History Main Topics  . Smoking status: Former Smoker    Packs/day: 0.50    Years: 20.00    Types: Cigarettes    Quit date: 02/23/1983  . Smokeless tobacco: Never Used  . Alcohol use 4.2 oz/week    7 Glasses of wine per week  . Drug use: No  . Sexual activity: Not on file   Other Topics Concern  . Not on file   Social History Narrative   From [place], household pt and wife   Daughter in [place], son in [place]           Family History  Problem Relation Age of Onset  . Colon cancer Other        M side (cousins)  . Diabetes Neg Hx   . Stroke Neg Hx   . CAD Neg Hx   . Prostate cancer Neg Hx   . Rectal cancer Neg Hx   . Esophageal cancer Neg Hx       Review of Systems: All other systems reviewed and are otherwise negative except as noted above.  Physical Exam: Vitals:   12/01/16 0415 12/01/16 0844 12/01/16 0845 12/01/16 1321  BP: 131/85 125/72  130/77  Pulse: 66 68 64 77  Resp: 14 13 15 16   Temp: 97.7 F (36.5 C) (!) 97.3 F (36.3 C)   98.3 F (36.8 C)  TempSrc: Oral Oral  Oral  SpO2: 93% 97% 96% 99%  Weight: 178 lb (80.7 kg)     Height: 5\' 10"  (1.778 m)       GEN- The patient is well appearing, alert and oriented x 3 today.   Head- normocephalic, atraumatic Eyes-  Sclera clear, conjunctiva pink Ears- hearing intact Oropharynx- clear Neck- supple Lungs- CTA b/l, normal work of breathing Heart- RRR, no murmurs, rubs or gallops  GI- soft, NT, ND Extremities- no clubbing, cyanosis, or edema MS- no significant deformity or atrophy Skin- no rash or lesion Psych- euthymic mood, full affect   Labs:   Lab Results  Component Value Date   WBC 10.0 11/30/2016   HGB 15.3 11/30/2016   HCT 45.8 11/30/2016   MCV 83.9 11/30/2016   PLT 201 11/30/2016    Recent Labs Lab 11/30/16 1059  NA 135  K 3.6  CL 96*  CO2 27  BUN 17  CREATININE 0.96  CALCIUM 9.5  PROT 7.3  BILITOT 1.4*  ALKPHOS 49  ALT 47  AST 44*  GLUCOSE 113*   Lab Results  Component Value Date   CKTOTAL 101 04/21/2010   Lab Results  Component Value Date   CHOL 119 11/30/2016   CHOL 110 11/10/2016   CHOL 185 09/12/2016   Lab Results  Component Value Date   HDL 44 11/30/2016   HDL 47.60 11/10/2016   HDL 42 09/12/2016   Lab Results  Component Value Date   LDLCALC 60 11/30/2016   LDLCALC 43 11/10/2016   LDLCALC 124 (H) 09/12/2016   Lab Results  Component Value Date   TRIG 76 11/30/2016   TRIG 98.0 11/10/2016   TRIG 94 09/12/2016   Lab Results  Component Value Date   CHOLHDL 2.7 11/30/2016   CHOLHDL 2 11/10/2016   CHOLHDL 4.4 09/12/2016   Lab Results  Component Value Date   LDLDIRECT 149.1 08/30/2012   LDLDIRECT 147.3  10/30/2010   LDLDIRECT 171.3 07/24/2010    Lab Results  Component Value Date   DDIMER 0.93 (H) 06/07/2014     Radiology/Studies:   Ct Angio Head W Or Wo Contrast Result Date: 11/30/2016 CLINICAL DATA:  76 year old male with right PICA infarct detected on CT today performed for 2 days of dizziness and  headache. History of Left MCA infarct in July. EXAM: CT ANGIOGRAPHY HEAD AND NECK TECHNIQUE: Multidetector CT imaging of the head and neck was performed using the standard protocol during bolus administration of intravenous contrast. Multiplanar CT image reconstructions and MIPs were obtained to evaluate the vascular anatomy. Carotid stenosis measurements (when applicable) are obtained utilizing NASCET criteria, using the distal internal carotid diameter as the denominator. CONTRAST:  50 mL Isovue 370 COMPARISON:  Head CT without contrast 1121 hours today. Brain MRI and intracranial MRA 09/12/2016. FINDINGS: CTA NECK Skeleton: Previous sternotomy. Chronic disc and endplate degeneration throughout the cervical spine. No acute osseous abnormality identified. Visualized paranasal sinuses and mastoids are stable and well pneumatized. Upper chest: Negative lung parenchyma. No superior mediastinal lymphadenopathy. Other neck: Negative.  No cervical lymphadenopathy. Aortic arch: 3 vessel arch configuration. Mild for age arch atherosclerosis. Right carotid system: Negative, minimal atherosclerosis. Left carotid system: Negative, minimal atherosclerosis. Vertebral arteries: No proximal right subclavian atherosclerosis. There is calcified the right vertebral artery origin, but no significant stenosis) series 9 image 68). Non dominant right vertebral artery is intermittently tortuous but patent to the skullbase without stenosis. Mild proximal left subclavian artery atherosclerosis without stenosis. Dominant left vertebral artery with widely patent origin (series 5, image 151). Ectatic left vertebral artery is widely patent to the skullbase. CTA HEAD Posterior circulation: On the July MRA of the distal right vertebral artery was non dominant, and the right vertebral V4 segment today has a similar appearance. Mild if any interval irregularity of the distal right vertebral. Also, the right PICA origin and proximal segment do  remain patent (series 7, image 77). The dominant distal left vertebral artery is widely patent to the vertebrobasilar junction with mild tortuosity. Patent basilar artery without stenosis. Ectasia of the basilar tip with patent SCA and PCA origins. Tortuous P1 segments. Posterior communicating arteries are diminutive or absent. Normal bilateral PCA branches. Anterior circulation: Patent mildly ectatic bilateral ICA siphons. Mild calcified siphon atherosclerosis with no stenosis. Normal ophthalmic artery origins. Patent carotid termini. Normal MCA and ACA origins. Tortuous A1 segments with azygos ACA A2 anatomy re- demonstrated. Visible ACA branches are within normal limits. Left MCA M1 segment tortuosity with early bifurcation again noted. Left MCA branches are within normal limits. Right MCA M1 segment is tortuous. Right MCA bifurcation and right MCA branches are within normal limits. Venous sinuses: Patent on the delayed images. Anatomic variants: Dominant left vertebral artery. Azygos type ACA A2 anatomy. Delayed phase: Continued cytotoxic edema in the inferior left cerebellum. No abnormal enhancement identified. Stable mild posterior fossa mass effect with no ventriculomegaly. No acute intracranial hemorrhage identified. Stable gray-white matter differentiation throughout the brain. Review of the MIP images confirms the above findings IMPRESSION: 1. Negative for emergent large vessel occlusion, and no large vessel stenosis identified. 2. The proximal Right PICA remains patent, and there is no definite acute abnormality of the non-dominant Right Vertebral Artery. 3. The left vertebral artery is dominant. The anterior and posterior circulation appear stable since the July MRA. There is minimal for age carotid atherosclerosis. 4. Stable CT appearance of right PICA territory infarct from earlier today with no hemorrhage or significant  mass effect. Electronically Signed   By: Genevie Ann M.D.   On: 11/30/2016 14:06     Dg Chest 2 View Result Date: 11/30/2016 CLINICAL DATA:  Stroke. EXAM: CHEST  2 VIEW COMPARISON:  Chest x-ray 09/14/2016. FINDINGS: Prior cardiac valve replacement. Heart size normal. No pulmonary venous congestion. Mild left base subsegmental atelectasis. No focal alveolar infiltrate. No pleural effusion or pneumothorax. IMPRESSION: 1.  Prior cardiac valve replacement.  Heart size normal. 2. Mild left base subsegmental atelectasis. Electronically Signed   By: Marcello Moores  Register   On: 11/30/2016 14:23    Ct Head Wo Contrast Result Date: 11/30/2016 CLINICAL DATA:  76 year old male status post left MCA infarct 1 month ago. New onset dizziness and headache for 2 days. EXAM: CT HEAD WITHOUT CONTRAST TECHNIQUE: Contiguous axial images were obtained from the base of the skull through the vertex without intravenous contrast. COMPARISON:  Brain MRI and intracranial MRA 09/12/2016. FINDINGS: Brain: Hypodense encephalomalacia corresponding to the posterior left insula and operculum area of restricted diffusion in July (series 3, image 19). The remaining supratentorial gray-white matter differentiation is normal. Confluent hypodensity throughout the right cerebellar tonsil and lower hemisphere (series 3, image 5). Partial effacement of the fourth ventricle, but overall mild posterior fossa mass effect. Petechial if any associated blood products. No malignant hemorrhagic transformation. The left cerebellar hemisphere is spared. No ventriculomegaly. No midline shift. No extra-axial collection. Vascular: Calcified atherosclerosis at the skull base. Skull: Intact.  No acute osseous abnormality identified. Sinuses/Orbits: Visualized paranasal sinuses and mastoids are stable and well pneumatized. Other: Visualized orbits and scalp soft tissues are within normal limits. IMPRESSION: 1. Acute to subacute Right PICA infarct affecting the cerebellum. Mild regional mass effect. No malignant hemorrhagic transformation. No  ventriculomegaly. 2. Expected evolution of the July Left MCA infarct. Electronically Signed   By: Genevie Ann M.D.   On: 11/30/2016 11:33    Mr Brain Wo Contrast Result Date: 11/30/2016 CLINICAL DATA:  Focal neuro deficit. EXAM: MRI HEAD WITHOUT CONTRAST TECHNIQUE: Multiplanar, multiecho pulse sequences of the brain and surrounding structures were obtained without intravenous contrast. COMPARISON:  09/12/2016 FINDINGS: Brain: Confluent restricted diffusion in the inferior right cerebellum. Mild petechial hemorrhage along the inferior right infarct. Fourth ventricle remains patent. No hydrocephalus. Band like facilitated diffusion in the posterior left frontal lobe where there was infarct July 2018 and gliosis on other sequences. Aside from the above described infarcts the brain has a normal appearance for age with no unexpected volume loss or white matter disease. No hydrocephalus, masslike finding, or extra-axial collection. Vascular: CTA performed earlier today. No detected change since that study. Skull and upper cervical spine: Negative for marrow lesion. Sinuses/Orbits: No significant finding. IMPRESSION: 1. Acute right PICA territory infarct with mild petechial hemorrhage. 2. Remote left MCA branch infarct. Electronically Signed   By: Monte Fantasia M.D.   On: 11/30/2016 20:11    12-lead ECG SR All prior EKG's in EPIC reviewed with no documented atrial fibrillation  Telemetry SR only  Assessment and Plan:  1. Cryptogenic stroke The patient presents with recurrent cryptogenic stroke.  The patient had a in August as noted.  Historically the patient has not wanted to pursue ILR.  After this new stroke and discussion with Dr. Lovena Le, including rational, the procedure and it's sks, benefits,  the patient is very clear in his decision to proceed with implantable loop recorder.   Wound care was reviewed with the patient (keep incision clean and dry for 3 days).  Wound check  has been scheduled for the  patient  Please call with questions.   Renee Dyane Dustman, PA-C 12/01/2016   EP attending  Patient seen and examined. Agree with the findings as documented above. He has an extensive neurologic history and a history of mitral valve repair, who refused loop recorder in the past. He has presented with another stroke and presents today for consideration of an implantable loop recorder. His exam is unremarkable except for minimal residual speech difficulty. I have discussed insertion of an implantable loop recorder with the patient and his family. He now is 1 to proceed. This will be scheduled later today.  Cristopher Peru, M.D.

## 2016-12-01 NOTE — Evaluation (Signed)
Physical Therapy Evaluation Patient Details Name: Kevin Bass MRN: [id number] DOB: [birth year] Today's Date: 12/01/2016   History of Present Illness  Patient is a 76 y/o male who presents with dizziness. PMH includes colon ca, HLD, MVR. MRI-Acute right PICA territory infarct with mild petechial hemorrhage.   Clinical Impression  Patient presents with mild balance deficits during head turns but no overt LOB. Reports some nausea with movement. Instructed pt in gaze stabilization esp during turns. Pt independent PTA and has support from spouse. Pt does not require skilled therapy services as pt is functioning close to baseline. All education completed. Reviewed signs/symptoms of stroke. Discharge from therapy.     Follow Up Recommendations No PT follow up    Equipment Recommendations  None recommended by PT    Recommendations for Other Services       Precautions / Restrictions Precautions Precautions: None Restrictions Weight Bearing Restrictions: No      Mobility  Bed Mobility Overal bed mobility: Needs Assistance Bed Mobility: Supine to Sit;Sit to Supine     Supine to sit: Modified independent (Device/Increase time) Sit to supine: Modified independent (Device/Increase time)   General bed mobility comments: No dizziness.   Transfers Overall transfer level: Needs assistance Equipment used: None Transfers: Sit to/from Stand Sit to Stand: Modified independent (Device/Increase time)         General transfer comment: Stood from EOB x1, no dizziness.   Ambulation/Gait Ambulation/Gait assistance: Modified independent (Device/Increase time) Ambulation Distance (Feet): 300 Feet Assistive device: None Gait Pattern/deviations: Step-through pattern;Decreased stride length;Staggering right   Gait velocity interpretation: at or above normal speed for age/gender General Gait Details: Mild staggering noted with head turns but no overt LOB. VSS. Instructed in gaze  stabilization, eyes, head, body prior to turning.   Stairs            Wheelchair Mobility    Modified Rankin (Stroke Patients Only) Modified Rankin (Stroke Patients Only) Pre-Morbid Rankin Score: No significant disability Modified Rankin: Slight disability     Balance Overall balance assessment: Needs assistance Sitting-balance support: Feet supported;No upper extremity supported Sitting balance-Leahy Scale: Good     Standing balance support: During functional activity Standing balance-Leahy Scale: Good                   Standardized Balance Assessment Standardized Balance Assessment : Dynamic Gait Index   Dynamic Gait Index Level Surface: Normal Change in Gait Speed: Normal Gait with Horizontal Head Turns: Mild Impairment Gait with Vertical Head Turns: Normal Gait and Pivot Turn: Normal Step Over Obstacle: Normal Step Around Obstacles: Normal Steps: Mild Impairment Total Score: 22       Pertinent Vitals/Pain Pain Assessment: Faces Faces Pain Scale: Hurts a little bit Pain Location: head Pain Descriptors / Indicators: Headache Pain Intervention(s): Monitored during session;Repositioned    Home Living Family/patient expects to be discharged to:: Private residence Living Arrangements: Spouse/significant other Available Help at Discharge: Family;Available 24 hours/day Type of Home: House Home Access: Stairs to enter Entrance Stairs-Rails: None Entrance Stairs-Number of Steps: 2 Home Layout: Two level;Bed/bath upstairs;1/2 bath on main level Home Equipment: Cane - single point;Shower seat - built in      Prior Function Level of Independence: Independent         Comments: Drives.      Hand Dominance   Dominant Hand: Right    Extremity/Trunk Assessment   Upper Extremity Assessment Upper Extremity Assessment: Defer to OT evaluation    Lower Extremity Assessment Lower Extremity Assessment: Overall  WFL for tasks assessed        Communication   Communication: No difficulties  Cognition Arousal/Alertness: Awake/alert Behavior During Therapy: WFL for tasks assessed/performed Overall Cognitive Status: Within Functional Limits for tasks assessed                                        General Comments General comments (skin integrity, edema, etc.): VSS throughout.     Exercises     Assessment/Plan    PT Assessment Patent does not need any further PT services  PT Problem List         PT Treatment Interventions      PT Goals (Current goals can be found in the Care Plan section)  Acute Rehab PT Goals Patient Stated Goal: to return to independence PT Goal Formulation: All assessment and education complete, DC therapy    Frequency     Barriers to discharge        Co-evaluation               AM-PAC PT "6 Clicks" Daily Activity  Outcome Measure Difficulty turning over in bed (including adjusting bedclothes, sheets and blankets)?: None Difficulty moving from lying on back to sitting on the side of the bed? : None Difficulty sitting down on and standing up from a chair with arms (e.g., wheelchair, bedside commode, etc,.)?: None Help needed moving to and from a bed to chair (including a wheelchair)?: None Help needed walking in hospital room?: None Help needed climbing 3-5 steps with a railing? : None 6 Click Score: 24    End of Session Equipment Utilized During Treatment: Gait belt Activity Tolerance: Patient tolerated treatment well Patient left: with call bell/phone within reach;with family/visitor present Nurse Communication: Mobility status PT Visit Diagnosis: Dizziness and giddiness (R42)    Time: 5188-4166 PT Time Calculation (min) (ACUTE ONLY): 18 min   Charges:   PT Evaluation $PT Eval Low Complexity: 1 Low     PT G CodesWray Kearns, PT, DPT [phone]    [name] 12/01/2016, 9:36 AM

## 2016-12-01 NOTE — Evaluation (Signed)
Speech Language Pathology Evaluation Patient Details Name: Kevin Bass MRN: [id number] DOB: 04-Feb-1941 Today's Date: 12/01/2016 Time: 1330-1400 SLP Time Calculation (min) (ACUTE ONLY): 30 min  Problem List:  Patient Active Problem List   Diagnosis Date Noted  . Trigeminal neuralgia of right side of face 10/01/2016  . History of mitral valve repair 09/30/2016  . Ischemic stroke ([place]) 09/12/2016  . GERD (gastroesophageal reflux disease) 09/22/2015  . PCP NOTES >>>>> 11/08/2014  . Chest pain 06/07/2014  . Facial neuropathy 04/02/2013  . Anxiety 02/26/2013  . Annual physical exam 08/30/2012  . HTN (hypertension) 01/22/2011  . HYPERTROPHY PROSTATE W/O UR OBST & OTH LUTS 10/16/2008  . Hyperglycemia 10/16/2008  . Hyperlipidemia 06/15/2006  . Mitral valve disease 06/15/2006  . ALLERGIC RHINITIS 06/15/2006  . COLON CANCER, HX OF 06/15/2006   Past Medical History:  Past Medical History:  Diagnosis Date  . Allergy   . Anxiety   . Arthritis    hands  . Colon cancer [facility])    s/p partial colectomy 1990, Cscope neg 7-09, next 2014  . DISORDER, MITRAL VALVE    MV recontruction at [place] 2006----still needs ABX for SBE prophylaxis   . Elevated PSA    [name]  . GERD (gastroesophageal reflux disease)   . Heart murmur    no problems since surgery  . HYPERGLYCEMIA, BORDERLINE 10/16/2008  . HYPERLIPIDEMIA 06/15/2006   diet controlled  . Hypertension   . HYPERTROPHY PROSTATE W/O UR OBST & OTH LUTS 10/16/2008  . Right groin pain    Dr. Redmond Pulling  . Wears partial dentures    lower partial   Past Surgical History:  Past Surgical History:  Procedure Laterality Date  . COLECTOMY  1990  . COLONOSCOPY    . HERNIA REPAIR Left 2014   [facility] rep w/mesh- March 2014  . MITRAL VALVE ANNULOPLASTY     [facility] 2006  . TEE WITHOUT CARDIOVERSION N/A 10/05/2016   Procedure: TRANSESOPHAGEAL ECHOCARDIOGRAM (TEE);  Surgeon: [name], MD;  Location: [facility] ENDOSCOPY;  Service: Cardiovascular;   Laterality: N/A;   HPI:  76 year old male admitted 11/30/16 due to dizziness. PMH significant for CVA (08/2016), HTN, HLD, hyperglycemia, GERD. MRI revealed acute right PICA territory infarct with mild petechial hemorrhage, remote LMCA infarct,   Assessment / Plan / Recommendation Clinical Impression  The Montreal Cognitive Assessment (MoCA) was administered. Pt scored 26/30 (=26+/30), indicating performance within normal limits. Points were lost on delayed recall subtest. Pt recalled 1/5 words after 5 minute delay. He reports he has had trouble with his memory since his CVA in July of this year.  Pt was encouraged to use compensatory strategies to facilitate functional recall, and to notify his PCP if increased difficulty is noticed once he returns to normal routines. Outpatient or home health therapy may be beneficial.    SLP Assessment  SLP Recommendation/Assessment: All further Speech Lanaguage Pathology  needs can be addressed in the next venue of care SLP Visit Diagnosis: Cognitive communication deficit (R41.841)    Follow Up Recommendations  Home health SLP;Outpatient SLP (if needs arise)       SLP Evaluation Cognition  Overall Cognitive Status: Within Functional Limits for tasks assessed Arousal/Alertness: Awake/alert Orientation Level: Oriented X4 Attention: Focused;Sustained;Selective Focused Attention: Appears intact Sustained Attention: Appears intact Selective Attention: Appears intact Memory: Impaired Memory Impairment: Retrieval deficit;Decreased recall of new information (pt reports decline in memory since prior CVA) Awareness: Appears intact Problem Solving: Appears intact Executive Function: Reasoning Reasoning: Appears intact Safety/Judgment: Appears  intact       Comprehension  Auditory Comprehension Overall Auditory Comprehension: Appears within functional limits for tasks assessed    Expression Expression Primary Mode of Expression: Verbal Verbal  Expression Overall Verbal Expression: Appears within functional limits for tasks assessed Written Expression Dominant Hand: Right   Oral / Motor  Oral Motor/Sensory Function Overall Oral Motor/Sensory Function: Within functional limits Motor Speech Overall Motor Speech: Appears within functional limits for tasks assessed   GO                   Juanjose Mojica B. Quentin Ore [facility], CCC-SLP Speech Language Pathologist [phone]  Shonna Chock 12/01/2016, 2:12 PM

## 2016-12-01 NOTE — Plan of Care (Signed)
Problem: Safety: Goal: Ability to remain free from injury will improve Outcome: Progressing Patient remains free from injury. Calls for assistance. Bed alarm maintained. Steady on feet while walking to bathroom.   Problem: Pain Managment: Goal: General experience of comfort will improve Outcome: Progressing Patient had complaints of mild headache during shift which was relieved with PRN Tylenol.   Problem: Physical Regulation: Goal: Ability to maintain clinical measurements within normal limits will improve Outcome: Progressing Shift NIHSS score 0. No decline in neuro status throughout shift assessments. BP has remained stable.

## 2016-12-01 NOTE — Interval H&P Note (Signed)
History and Physical Interval Note:  12/01/2016 4:51 PM  Kevin Bass  has presented today for surgery, with the diagnosis of stroke  The various methods of treatment have been discussed with the patient and family. After consideration of risks, benefits and other options for treatment, the patient has consented to  Procedure(s): LOOP RECORDER INSERTION (N/A) as a surgical intervention .  The patient's history has been reviewed, patient examined, no change in status, stable for surgery.  I have reviewed the patient's chart and labs.  Questions were answered to the patient's satisfaction.     Cristopher Peru

## 2016-12-01 NOTE — Consult Note (Signed)
ELECTROPHYSIOLOGY CONSULT NOTE  Patient ID: Kevin Bass MRN: [id number], DOB/AGE: 04/27/1940   Admit date: 11/30/2016 Date of Consult: 12/01/2016  Primary Physician: Colon Branch, MD Primary Cardiologist: Dr. Debara Pickett Reason for Consultation: Cryptogenic stroke ; recommendations regarding Implantable Loop Recorder  History of Present Illness Kevin Bass was admitted on 11/30/2016 with acute stroke, symptoms of gait instability and vertigo-like symptoms.  He first developed symptoms while at home. PMHx includes MV repair remotely, HTN, HLD, colon cancer treated surgically, and prior stroke. He was admitted on 09/12/16 with stroke, presented with expressive aphasia. Imaging demonstrated acute infarct left posterior insula and parietal operculum felt to be embolic 2/2 unknown source.  He underwent stroke w/u recommended to have TEE/ILR consultation, though unable to complete TEE 2/2 equipment issues and ultimately preferred to f/u with his cardiologist for guidance declining TEE/ILR.  He was seen out patient by Dr. Debara Pickett afterwards, and in 10/01/16 underwent TEE, noting LAA ligated, the patient wanted to evaluate further data and declined loop at that time.  This admission, Imaging demonstrated per neurology: left PICA infarct, embolic secondary to unclear source. Along with his recent left MCA stroke, concerning for cardioembolic source. Need loop recorder placement.   he has undergone workup for stroke including echocardiogram and carotid angio.  Prior TEE with his last stroke was neg for PFO or thrombus noting LAA was surgically occluded  The patient has been monitored on telemetry which has demonstrated sinus rhythm with no arrhythmias.    Echocardiogram this admission demonstrated.  11/30/16: TTE Study Conclusions - Left ventricle: The cavity size was normal. Systolic function was   normal. The estimated ejection fraction was in the range of 55%   to 60%. Wall motion was normal; there  were no regional wall   motion abnormalities. - Aortic valve: Trileaflet; mildly thickened, mildly calcified   leaflets. - Mitral valve: Calcified annulus. Valve area by pressure   half-time: 1.82 cm^2. - Left atrium: The atrium was mildly dilated. - Pulmonic valve: There was mild regurgitation.   10/05/16: TEE IMPRESSION:  1. Surgically ligated LAA without flow detectable by color doppler 2. Negative for PFO 3. Stable mitral valve annuloplasty ring and repair with trivial MR 4. LVEF 60-65% with normal wall motion    Lab work is reviewed.  Prior to admission, the patient denies chest pain, shortness of breath, dizziness, palpitations, or syncope.  They are recovering from their stroke with plans to home at discharge.  EP has been asked to evaluate again for placement of an implantable loop recorder to monitor for atrial fibrillation from Dr. Erlinda Hong.     Past Medical History:  Diagnosis Date  . Allergy   . Anxiety   . Arthritis    hands  . Colon cancer [facility])    s/p partial colectomy 1990, Cscope neg 7-09, next 2014  . DISORDER, MITRAL VALVE    MV recontruction at [place] 2006----still needs ABX for SBE prophylaxis   . Elevated PSA    [name]  . GERD (gastroesophageal reflux disease)   . Heart murmur    no problems since surgery  . HYPERGLYCEMIA, BORDERLINE 10/16/2008  . HYPERLIPIDEMIA 06/15/2006   diet controlled  . Hypertension   . HYPERTROPHY PROSTATE W/O UR OBST & OTH LUTS 10/16/2008  . Right groin pain    Dr. Redmond Pulling  . Wears partial dentures    lower partial     Surgical History:  Past Surgical History:  Procedure Laterality Date  . COLECTOMY  1990  .  COLONOSCOPY    . HERNIA REPAIR Left 2014   [facility] rep w/mesh- March 2014  . MITRAL VALVE ANNULOPLASTY     [facility] 2006  . TEE WITHOUT CARDIOVERSION N/A 10/05/2016   Procedure: TRANSESOPHAGEAL ECHOCARDIOGRAM (TEE);  Surgeon: [name], MD;  Location: [facility] ENDOSCOPY;  Service: Cardiovascular;  Laterality: N/A;      Prescriptions Prior to Admission  Medication Sig Dispense Refill Last Dose  . ALPRAZolam (XANAX) 0.25 MG tablet Take 1-2 tablets (0.25-0.5 mg total) by mouth 2 (two) times daily as needed for anxiety. 40 tablet 1 Past Month at Unknown time  . Arginine 500 MG CAPS Take 1,500 mg by mouth daily.   11/29/2016 at Unknown time  . aspirin EC 81 MG tablet Take 81 mg by mouth at bedtime.   11/29/2016 at Unknown time  . clopidogrel (PLAVIX) 75 MG tablet Take 1 tablet (75 mg total) by mouth daily. (Patient taking differently: Take 75 mg by mouth daily at 2 PM. ) 30 tablet 4 11/29/2016 at Unknown time  . ezetimibe (ZETIA) 10 MG tablet Take 1 tablet (10 mg total) by mouth daily. (Patient taking differently: Take 10 mg by mouth at bedtime. ) 90 tablet 1 11/29/2016 at Unknown time  . gabapentin (NEURONTIN) 300 MG capsule Take 1 capsule (300 mg total) by mouth 2 (two) times daily. 60 capsule 6 11/29/2016 at Unknown time  . Glucosamine-MSM-Hyaluronic Acd (JOINT HEALTH PO) Take 1 tablet by mouth daily. [place]   11/29/2016 at Unknown time  . hydrochlorothiazide (HYDRODIURIL) 25 MG tablet Take 25 mg by mouth at bedtime.   11/29/2016 at Unknown time  . loratadine (CLARITIN) 10 MG tablet Take 10 mg by mouth at bedtime.    11/29/2016 at Unknown time  . LYCOPENE PO Take 1 capsule by mouth daily. JARROW FORMULA LYCO-SORB PROSTATE SUPPORT   11/29/2016 at Unknown time  . metoprolol tartrate (LOPRESSOR) 25 MG tablet Take 1 tablet (25 mg total) by mouth daily.   11/29/2016 at am  . Multiple Vitamins-Minerals (OCUVITE PRESERVISION PO) Take 1 tablet by mouth 2 (two) times daily.    11/29/2016 at Unknown time  . Omega-3 Fatty Acids (FISH OIL PO) Take 1,000 mg by mouth daily.    11/29/2016 at Unknown time  . omeprazole (PRILOSEC) 40 MG capsule Take 1 capsule (40 mg total) by mouth daily. (Patient taking differently: Take 40 mg by mouth daily as needed (for acid reflux). ) 90 capsule 3 Past Month at Unknown time  . Red Yeast  Rice Extract (RED YEAST RICE PO) Take 1 tablet by mouth 2 (two) times daily. MORNING & AFTERNOON   11/29/2016 at Unknown time  . rosuvastatin (CRESTOR) 20 MG tablet Take 1 tablet (20 mg total) by mouth at bedtime. 30 tablet 3 11/29/2016 at Unknown time  . UNABLE TO FIND Outpatient physical therapy, occupational therapy, speech therapy  Diagnosis: CVA 1 Mutually Defined 0 Taking    Inpatient Medications:  . aspirin EC  81 mg Oral QHS  . clopidogrel  75 mg Oral Q1400  . ezetimibe  10 mg Oral QHS  . gabapentin  300 mg Oral QHS  . iopamidol      . loratadine  10 mg Oral QHS  . pantoprazole  80 mg Oral Daily  . rosuvastatin  20 mg Oral QHS    Allergies:  Allergies  Allergen Reactions  . Statins Other (See Comments)    Myalgias (intolerance)    Social History   Social History  . Marital  status: Married    Spouse name: suzanne  . Number of children: 2  . Years of education: college   Occupational History  . retired     Social History Main Topics  . Smoking status: Former Smoker    Packs/day: 0.50    Years: 20.00    Types: Cigarettes    Quit date: 02/23/1983  . Smokeless tobacco: Never Used  . Alcohol use 4.2 oz/week    7 Glasses of wine per week  . Drug use: No  . Sexual activity: Not on file   Other Topics Concern  . Not on file   Social History Narrative   From [place], household pt and wife   Daughter in [place], son in [place]           Family History  Problem Relation Age of Onset  . Colon cancer Other        M side (cousins)  . Diabetes Neg Hx   . Stroke Neg Hx   . CAD Neg Hx   . Prostate cancer Neg Hx   . Rectal cancer Neg Hx   . Esophageal cancer Neg Hx       Review of Systems: All other systems reviewed and are otherwise negative except as noted above.  Physical Exam: Vitals:   12/01/16 0415 12/01/16 0844 12/01/16 0845 12/01/16 1321  BP: 131/85 125/72  130/77  Pulse: 66 68 64 77  Resp: 14 13 15 16   Temp: 97.7 F (36.5 C) (!) 97.3 F (36.3 C)   98.3 F (36.8 C)  TempSrc: Oral Oral  Oral  SpO2: 93% 97% 96% 99%  Weight: 178 lb (80.7 kg)     Height: 5\' 10"  (1.778 m)       GEN- The patient is well appearing, alert and oriented x 3 today.   Head- normocephalic, atraumatic Eyes-  Sclera clear, conjunctiva pink Ears- hearing intact Oropharynx- clear Neck- supple Lungs- CTA b/l, normal work of breathing Heart- RRR, no murmurs, rubs or gallops  GI- soft, NT, ND Extremities- no clubbing, cyanosis, or edema MS- no significant deformity or atrophy Skin- no rash or lesion Psych- euthymic mood, full affect   Labs:   Lab Results  Component Value Date   WBC 10.0 11/30/2016   HGB 15.3 11/30/2016   HCT 45.8 11/30/2016   MCV 83.9 11/30/2016   PLT 201 11/30/2016    Recent Labs Lab 11/30/16 1059  NA 135  K 3.6  CL 96*  CO2 27  BUN 17  CREATININE 0.96  CALCIUM 9.5  PROT 7.3  BILITOT 1.4*  ALKPHOS 49  ALT 47  AST 44*  GLUCOSE 113*   Lab Results  Component Value Date   CKTOTAL 101 04/21/2010   Lab Results  Component Value Date   CHOL 119 11/30/2016   CHOL 110 11/10/2016   CHOL 185 09/12/2016   Lab Results  Component Value Date   HDL 44 11/30/2016   HDL 47.60 11/10/2016   HDL 42 09/12/2016   Lab Results  Component Value Date   LDLCALC 60 11/30/2016   LDLCALC 43 11/10/2016   LDLCALC 124 (H) 09/12/2016   Lab Results  Component Value Date   TRIG 76 11/30/2016   TRIG 98.0 11/10/2016   TRIG 94 09/12/2016   Lab Results  Component Value Date   CHOLHDL 2.7 11/30/2016   CHOLHDL 2 11/10/2016   CHOLHDL 4.4 09/12/2016   Lab Results  Component Value Date   LDLDIRECT 149.1 08/30/2012   LDLDIRECT 147.3  10/30/2010   LDLDIRECT 171.3 07/24/2010    Lab Results  Component Value Date   DDIMER 0.93 (H) 06/07/2014     Radiology/Studies:   Ct Angio Head W Or Wo Contrast Result Date: 11/30/2016 CLINICAL DATA:  76 year old male with right PICA infarct detected on CT today performed for 2 days of dizziness and  headache. History of Left MCA infarct in July. EXAM: CT ANGIOGRAPHY HEAD AND NECK TECHNIQUE: Multidetector CT imaging of the head and neck was performed using the standard protocol during bolus administration of intravenous contrast. Multiplanar CT image reconstructions and MIPs were obtained to evaluate the vascular anatomy. Carotid stenosis measurements (when applicable) are obtained utilizing NASCET criteria, using the distal internal carotid diameter as the denominator. CONTRAST:  50 mL Isovue 370 COMPARISON:  Head CT without contrast 1121 hours today. Brain MRI and intracranial MRA 09/12/2016. FINDINGS: CTA NECK Skeleton: Previous sternotomy. Chronic disc and endplate degeneration throughout the cervical spine. No acute osseous abnormality identified. Visualized paranasal sinuses and mastoids are stable and well pneumatized. Upper chest: Negative lung parenchyma. No superior mediastinal lymphadenopathy. Other neck: Negative.  No cervical lymphadenopathy. Aortic arch: 3 vessel arch configuration. Mild for age arch atherosclerosis. Right carotid system: Negative, minimal atherosclerosis. Left carotid system: Negative, minimal atherosclerosis. Vertebral arteries: No proximal right subclavian atherosclerosis. There is calcified the right vertebral artery origin, but no significant stenosis) series 9 image 68). Non dominant right vertebral artery is intermittently tortuous but patent to the skullbase without stenosis. Mild proximal left subclavian artery atherosclerosis without stenosis. Dominant left vertebral artery with widely patent origin (series 5, image 151). Ectatic left vertebral artery is widely patent to the skullbase. CTA HEAD Posterior circulation: On the July MRA of the distal right vertebral artery was non dominant, and the right vertebral V4 segment today has a similar appearance. Mild if any interval irregularity of the distal right vertebral. Also, the right PICA origin and proximal segment do  remain patent (series 7, image 77). The dominant distal left vertebral artery is widely patent to the vertebrobasilar junction with mild tortuosity. Patent basilar artery without stenosis. Ectasia of the basilar tip with patent SCA and PCA origins. Tortuous P1 segments. Posterior communicating arteries are diminutive or absent. Normal bilateral PCA branches. Anterior circulation: Patent mildly ectatic bilateral ICA siphons. Mild calcified siphon atherosclerosis with no stenosis. Normal ophthalmic artery origins. Patent carotid termini. Normal MCA and ACA origins. Tortuous A1 segments with azygos ACA A2 anatomy re- demonstrated. Visible ACA branches are within normal limits. Left MCA M1 segment tortuosity with early bifurcation again noted. Left MCA branches are within normal limits. Right MCA M1 segment is tortuous. Right MCA bifurcation and right MCA branches are within normal limits. Venous sinuses: Patent on the delayed images. Anatomic variants: Dominant left vertebral artery. Azygos type ACA A2 anatomy. Delayed phase: Continued cytotoxic edema in the inferior left cerebellum. No abnormal enhancement identified. Stable mild posterior fossa mass effect with no ventriculomegaly. No acute intracranial hemorrhage identified. Stable gray-white matter differentiation throughout the brain. Review of the MIP images confirms the above findings IMPRESSION: 1. Negative for emergent large vessel occlusion, and no large vessel stenosis identified. 2. The proximal Right PICA remains patent, and there is no definite acute abnormality of the non-dominant Right Vertebral Artery. 3. The left vertebral artery is dominant. The anterior and posterior circulation appear stable since the July MRA. There is minimal for age carotid atherosclerosis. 4. Stable CT appearance of right PICA territory infarct from earlier today with no hemorrhage or significant  mass effect. Electronically Signed   By: Genevie Ann M.D.   On: 11/30/2016 14:06     Dg Chest 2 View Result Date: 11/30/2016 CLINICAL DATA:  Stroke. EXAM: CHEST  2 VIEW COMPARISON:  Chest x-ray 09/14/2016. FINDINGS: Prior cardiac valve replacement. Heart size normal. No pulmonary venous congestion. Mild left base subsegmental atelectasis. No focal alveolar infiltrate. No pleural effusion or pneumothorax. IMPRESSION: 1.  Prior cardiac valve replacement.  Heart size normal. 2. Mild left base subsegmental atelectasis. Electronically Signed   By: Marcello Moores  Register   On: 11/30/2016 14:23    Ct Head Wo Contrast Result Date: 11/30/2016 CLINICAL DATA:  76 year old male status post left MCA infarct 1 month ago. New onset dizziness and headache for 2 days. EXAM: CT HEAD WITHOUT CONTRAST TECHNIQUE: Contiguous axial images were obtained from the base of the skull through the vertex without intravenous contrast. COMPARISON:  Brain MRI and intracranial MRA 09/12/2016. FINDINGS: Brain: Hypodense encephalomalacia corresponding to the posterior left insula and operculum area of restricted diffusion in July (series 3, image 19). The remaining supratentorial gray-white matter differentiation is normal. Confluent hypodensity throughout the right cerebellar tonsil and lower hemisphere (series 3, image 5). Partial effacement of the fourth ventricle, but overall mild posterior fossa mass effect. Petechial if any associated blood products. No malignant hemorrhagic transformation. The left cerebellar hemisphere is spared. No ventriculomegaly. No midline shift. No extra-axial collection. Vascular: Calcified atherosclerosis at the skull base. Skull: Intact.  No acute osseous abnormality identified. Sinuses/Orbits: Visualized paranasal sinuses and mastoids are stable and well pneumatized. Other: Visualized orbits and scalp soft tissues are within normal limits. IMPRESSION: 1. Acute to subacute Right PICA infarct affecting the cerebellum. Mild regional mass effect. No malignant hemorrhagic transformation. No  ventriculomegaly. 2. Expected evolution of the July Left MCA infarct. Electronically Signed   By: Genevie Ann M.D.   On: 11/30/2016 11:33    Mr Brain Wo Contrast Result Date: 11/30/2016 CLINICAL DATA:  Focal neuro deficit. EXAM: MRI HEAD WITHOUT CONTRAST TECHNIQUE: Multiplanar, multiecho pulse sequences of the brain and surrounding structures were obtained without intravenous contrast. COMPARISON:  09/12/2016 FINDINGS: Brain: Confluent restricted diffusion in the inferior right cerebellum. Mild petechial hemorrhage along the inferior right infarct. Fourth ventricle remains patent. No hydrocephalus. Band like facilitated diffusion in the posterior left frontal lobe where there was infarct July 2018 and gliosis on other sequences. Aside from the above described infarcts the brain has a normal appearance for age with no unexpected volume loss or white matter disease. No hydrocephalus, masslike finding, or extra-axial collection. Vascular: CTA performed earlier today. No detected change since that study. Skull and upper cervical spine: Negative for marrow lesion. Sinuses/Orbits: No significant finding. IMPRESSION: 1. Acute right PICA territory infarct with mild petechial hemorrhage. 2. Remote left MCA branch infarct. Electronically Signed   By: Monte Fantasia M.D.   On: 11/30/2016 20:11    12-lead ECG SR All prior EKG's in EPIC reviewed with no documented atrial fibrillation  Telemetry SR only  Assessment and Plan:  1. Cryptogenic stroke The patient presents with recurrent cryptogenic stroke.  The patient had a in August as noted.  Historically the patient has not wanted to pursue ILR.  After this new stroke and discussion with Dr. Lovena Le, including rational, the procedure and it's sks, benefits,  the patient is very clear in his decision to proceed with implantable loop recorder.   Wound care was reviewed with the patient (keep incision clean and dry for 3 days).  Wound check  has been scheduled for the  patient  Please call with questions.   Renee Dyane Dustman, PA-C 12/01/2016   EP attending  Patient seen and examined. Agree with the findings as documented above. He has an extensive neurologic history and a history of mitral valve repair, who refused loop recorder in the past. He has presented with another stroke and presents today for consideration of an implantable loop recorder. His exam is unremarkable except for minimal residual speech difficulty. I have discussed insertion of an implantable loop recorder with the patient and his family. He now is 1 to proceed. This will be scheduled later today.  Cristopher Peru, M.D.

## 2016-12-01 NOTE — Progress Notes (Signed)
STROKE TEAM PROGRESS NOTE   SUBJECTIVE (INTERVAL HISTORY) His wife is at the bedside.  Overall he feels his condition is rapidly improving. No more vertigo and he walks with PT/OT well today. He had TEE with Dr. Debara Pickett in 09/2016 and had 48 hr holter monitor in 08/2016 both negative. Loop recorder was not placed. He had colon cancer s/p surgery in 1990 and no known recurrence so far.    OBJECTIVE Temp:  [97.3 F (36.3 C)-98.3 F (36.8 C)] 98.3 F (36.8 C) (10/10 1321) Pulse Rate:  [63-77] 77 (10/10 1321) Cardiac Rhythm: Normal sinus rhythm (10/10 0845) Resp:  [13-20] 16 (10/10 1321) BP: (120-132)/(65-85) 130/77 (10/10 1321) SpO2:  [93 %-100 %] 99 % (10/10 1321) Weight:  [178 lb (80.7 kg)-178 lb 9.2 oz (81 kg)] 178 lb (80.7 kg) (10/10 0415)  No results for input(s): GLUCAP in the last 168 hours.  Recent Labs Lab 11/30/16 1059  NA 135  K 3.6  CL 96*  CO2 27  GLUCOSE 113*  BUN 17  CREATININE 0.96  CALCIUM 9.5    Recent Labs Lab 11/30/16 1059  AST 44*  ALT 47  ALKPHOS 49  BILITOT 1.4*  PROT 7.3  ALBUMIN 4.3    Recent Labs Lab 11/30/16 1059  WBC 10.0  NEUTROABS 8.7*  HGB 15.3  HCT 45.8  MCV 83.9  PLT 201   No results for input(s): CKTOTAL, CKMB, CKMBINDEX, TROPONINI in the last 168 hours.  Recent Labs  11/30/16 1059  LABPROT 14.5  INR 1.14    Recent Labs  11/30/16 1109  COLORURINE AMBER*  LABSPEC 1.027  PHURINE 5.0  GLUCOSEU NEGATIVE  HGBUR NEGATIVE  BILIRUBINUR NEGATIVE  KETONESUR 80*  PROTEINUR >=300*  NITRITE NEGATIVE  LEUKOCYTESUR NEGATIVE       Component Value Date/Time   CHOL 119 11/30/2016 1059   TRIG 76 11/30/2016 1059   HDL 44 11/30/2016 1059   CHOLHDL 2.7 11/30/2016 1059   VLDL 15 11/30/2016 1059   LDLCALC 60 11/30/2016 1059   Lab Results  Component Value Date   HGBA1C 5.5 11/30/2016      Component Value Date/Time   LABOPIA NONE DETECTED 11/30/2016 1109   COCAINSCRNUR NONE DETECTED 11/30/2016 1109   LABBENZ NONE DETECTED  11/30/2016 1109   AMPHETMU NONE DETECTED 11/30/2016 1109   THCU NONE DETECTED 11/30/2016 1109   LABBARB NONE DETECTED 11/30/2016 1109     Recent Labs Lab 11/30/16 1432  ETH <10    I have personally reviewed the radiological images below and agree with the radiology interpretations.  Ct Angio Head and neck W Or Wo Contrast 11/30/2016 IMPRESSION: 1. Negative for emergent large vessel occlusion, and no large vessel stenosis identified. 2. The proximal Right PICA remains patent, and there is no definite acute abnormality of the non-dominant Right Vertebral Artery. 3. The left vertebral artery is dominant. The anterior and posterior circulation appear stable since the July MRA. There is minimal for age carotid atherosclerosis. 4. Stable CT appearance of right PICA territory infarct from earlier today with no hemorrhage or significant mass effect.   Dg Chest 2 View 11/30/2016 IMPRESSION: 1.  Prior cardiac valve replacement.  Heart size normal. 2. Mild left base subsegmental atelectasis.   Ct Head Wo Contrast 11/30/2016 IMPRESSION: 1. Acute to subacute Right PICA infarct affecting the cerebellum. Mild regional mass effect. No malignant hemorrhagic transformation. No ventriculomegaly. 2. Expected evolution of the July Left MCA infarct.   Mr Brain Wo Contrast 11/30/2016 IMPRESSION: 1. Acute right PICA territory  infarct with mild petechial hemorrhage. 2. Remote left MCA branch infarct.   2D Echocardiogram   - Left ventricle: The cavity size was normal. Systolic function was   normal. The estimated ejection fraction was in the range of 55%   to 60%. Wall motion was normal; there were no regional wall   motion abnormalities. - Aortic valve: Trileaflet; mildly thickened, mildly calcified   leaflets. - Mitral valve: Calcified annulus. Valve area by pressure   half-time: 1.82 cm^2. - Left atrium: The atrium was mildly dilated. - Pulmonic valve: There was mild regurgitation.  TEE 09/2016 - Left  ventricle: There was mild concentric hypertrophy. Systolic   function was normal. The estimated ejection fraction was in the   range of 60% to 65%. Wall motion was normal; there were no   regional wall motion abnormalities. - Aortic valve: Structurally normal valve. Trileaflet. Cusp   separation was normal. - Mitral valve: Prior annuloplasty repair. No stenosis. Trivial MR. - Left atrium: The atrium was dilated. The left atrial appendage   was surgically closed and is flush occluded. No flow is noted in   teh appendage by color doppler. No thrombus was noted in the left   atrium. - Right atrium: No evidence of thrombus in the atrial cavity or   appendage. - Atrial septum: No defect or patent foramen ovale was identified. Impressions: - No cardiac source of emboli was indentified. The left atrial   appendage is surgically closed.  CT chest / abd / pelvis - pending   PHYSICAL EXAM  Temp:  [97.3 F (36.3 C)-98.3 F (36.8 C)] 98.3 F (36.8 C) (10/10 1321) Pulse Rate:  [63-77] 77 (10/10 1321) Resp:  [13-20] 16 (10/10 1321) BP: (120-132)/(65-85) 130/77 (10/10 1321) SpO2:  [93 %-100 %] 99 % (10/10 1321) Weight:  [178 lb (80.7 kg)-178 lb 9.2 oz (81 kg)] 178 lb (80.7 kg) (10/10 0415)  General - Well nourished, well developed, in no apparent distress.  Ophthalmologic - Sharp disc margins OU.  Cardiovascular - Regular rate and rhythm with no murmur.  Mental Status -  Level of arousal and orientation to time, place, and person were intact. Language including expression, naming, repetition, comprehension was assessed and found intact. Attention span and concentration were normal. Fund of Knowledge was assessed and was intact.  Cranial Nerves II - XII - II - Visual field intact OU. III, IV, VI - Extraocular movements intact. V - Facial sensation intact bilaterally. VII - Facial movement intact bilaterally. VIII - Hearing & vestibular intact bilaterally. X - Palate elevates  symmetrically. XI - Chin turning & shoulder shrug intact bilaterally. XII - Tongue protrusion intact.  Motor Strength - The patient's strength was normal in all extremities and pronator drift was absent.  Bulk was normal and fasciculations were absent.   Motor Tone - Muscle tone was assessed at the neck and appendages and was normal.  Reflexes - The patient's reflexes were symmetrical in all extremities and he had no pathological reflexes.  Sensory - Light touch, temperature/pinprick were assessed and were symmetrical.    Coordination - The patient had normal movements in the hands and feet with no ataxia or dysmetria.  Tremor was absent.  Gait and Station - deferred   ASSESSMENT/PLAN Kevin Bass is a 76 y.o. male with history of HTN, MVR 13 years ago, HLD, colon cancer s/p colectomy in 1990, recent left MCA infarct in 08/2016 admitted for vertigo, imbalance. Symptoms rapidly improving.    Stroke:  left PICA  infarct, embolic secondary to unclear source. Along with his recent left MCA stroke, concerning for cardioembolic source. Need loop recorder placement. Will rule out malignancy first due to hx of colon cancer.  MRI  Left PICA infarct, old left MCA infarct  CTA head and neck - unremarkable, right VA ends up at PICA largely, but still has tiny branch to BA  2D Echo  EF 55-60%  Will do CT chest/abd/pelvis to rule out malignancy given hx of colon cancer  Recommend loop recorder placement if no advance malignancy seen.   LDL 60  HgbA1c 5.5  SCDs for VTE prophylaxis  Diet regular Room service appropriate? Yes; Fluid consistency: Thin   aspirin 81 mg daily and clopidogrel 75 mg daily prior to admission, now on aspirin 81 mg daily and clopidogrel 75 mg daily. Continue DAPT for 3 months and then plavix alone.   Patient counseled to be compliant with his antithrombotic medications  Ongoing aggressive stroke risk factor management  Therapy recommendations:   pending  Disposition:  Pending  Hx of stroke  08/2016 - left MCA infarcts, MRA neg, CUS neg, EF 60-65%, LDL 124 and A1C 5.5. He was put on DAPT on discharge  outpt TEE with Dr. Debara Pickett on 10/05/16 - no PFO, LAA surgically closed, no cardiac source of stroke  48 hr holter monitor negative  Loop recorder not put in at that time  Hypertension  Home meds:   metoprolol Permissive hypertension (OK if <220/120) for 24-48 hours post stroke and then gradually normalized within 5-7 days. Stable  Hyperlipidemia  Home meds:  crestor 20   Currently crestor resumed   LDL 60, goal < 70  Continue statin at discharge  Other Stroke Risk Factors  Advanced age  MVR 13 years ago  Other Active Problems  None   LAA surgically closed  Hospital day # 1  This patient is medically critical due to recurrent embolic strokes, hx of cancer, MVR. Patient is at high risk for recurrent strokes and TIAs and neurological worsening. This patient's care requires constant monitoring of vital signs, hemodynamics, respiratory and cardiac monitoring, review of multiple databases, neurological assessment, discussion with family, other specialists and medical decision making of high complexity. I spent 35 min in taking care of this pt.    Rosalin Hawking, MD PhD Stroke Neurology 12/01/2016 2:18 PM    To contact Stroke Continuity provider, please refer to [email or web address] After hours, contact General Neurology

## 2016-12-01 NOTE — Assessment & Plan Note (Signed)
Stroke?: Sudden onset of dizziness follow-up by headache and nausea started around 18 hours ago, he has a history of recent stroke, on aspirin and Plavix. Neurological exam is nonfocal. He needs immediate attention. I spoke with the ER doctor (Dr Theora Gianotti) , will send him to [facility]. His  wife is here with him, this was discussed with her. She states can safely transport the patient. Nevertheless, I advised the patient and the wife to notify the ER staff if anything changes before he sees the doctor. Hyperlipidemia: Well-controlled per last FLP, needs LFTs

## 2016-12-01 NOTE — Progress Notes (Signed)
OT Cancellation Note  Patient Details Name: Kevin Bass MRN: [id number] DOB: [birth year]   Cancelled Treatment:    Reason Eval/Treat Not Completed: OT screened, no needs identified, will sign off  [name] 12/01/2016, 12:01 PM

## 2016-12-02 ENCOUNTER — Encounter (HOSPITAL_COMMUNITY): Payer: Self-pay | Admitting: Internal Medicine

## 2016-12-02 MED ORDER — GABAPENTIN 300 MG PO CAPS: 300.0000 mg | ORAL_CAPSULE | Freq: Every day | ORAL | 0 refills | Status: DC

## 2016-12-02 NOTE — Discharge Summary (Signed)
Physician Discharge Summary  Kevin Bass WLN:[id number] DOB: [birth year] DOA: 11/30/2016  PCP: Colon Branch, MD  Admit date: 11/30/2016 Discharge date: 12/02/2016  Time spent: 35 minutes  Recommendations for Outpatient Follow-up:  CVA -patient with previous cryptogenic stroke July 2018 with residual slurred speech(slurred speech resolved) -CT head/MRI brain/CTA: Positive Acute RIGHT PICA territory infarct, and Remote LEFT MCA branch infarct see results below  -10/9 Hemoglobin A1c= 5.5 not consistent with prediabetes/diabetes -per Stroke Team Continue Plavix and aspirin for 3 months,and then Plavix alone -10/10 S/P recorder placement: R/O Cardioembolic source. Follow up at [facility] on 10/24 @ 1430 -Follow-up with Dr Ellouise Newer Neurology in 6 weeks   Hx Colon Cancer -CT abdomen pelvis pending: May be reviewed with PCP -CT chest pending: May be reviewed with PCP   Essential HTN -Controlled without medication   MV disease -S/P Annuloplasty 2006 -Continue Plavix and aspirin   HLD  -10/9 Lipid panel: Within ADA guidelines -Crestor 20 mg daily  -Zetia 10 mg daily  -LDL goal< 70    Discharge Diagnoses:  Principal Problem:   Ischemic stroke [facility]) Active Problems:   Hyperlipidemia   Mitral valve disease   HTN (hypertension)   GERD (gastroesophageal reflux disease)   Discharge Condition:   Diet recommendation:   Filed Weights   11/30/16 1500 12/01/16 0415 12/02/16 0413  Weight: 178 lb 9.2 oz (81 kg) 178 lb (80.7 kg) 178 lb 4.8 oz (80.9 kg)    History of present illness:  76 y.o. WM PMHx anxiety, stroke 3 months ago, HTN, MV Disorder, HLD,Hyperglycemia. Hx Colon cancer    Presents to emergency department from his primary care provider's office chief complaint of dizziness. PCP was concerned about stroke so patient presented to the emergency department. [facility] hospitalists are asked to admit.   Information is obtained from the patient and his wife is at the bedside. He  states he was in his usual state of health until about 2:00 yesterday when he suddenly became very dizzy. He describes the feeling as "spinning". He states his gait became unsteady had hold onto the walls and furniture ultimately he did fall. He denies hitting his head or losing consciousness. Associated symptoms include mild shortness of breath and some diaphoresis. He then developed a frontal headache and some nausea. He denies chest pain palpitations abdominal pain vomiting. He reports he called the on-call service who recommended that he see his PCP next day. He reports he slept well through the night workup about 6 AM but he still had a headache. He reports he took Motrin with some improvement.  CVA. During his hospital is a patient with steam by stroke team as well as cardiology and workup. Unfortunately patient and wife decided, that since cardiology and stroke team had already evaluated them they did not have to wait for primary team to complete their evaluation and discharge. Patient and wife have been notified that primary team was dealing with unstable patient's, however patient decided he was stable and he discharged himself.     Procedures: 10/9 CT Head WO contrast:Acute to subacute Right PICA infarct affecting the cerebellum. Mild regional mass effect. No malignant hemorrhagic transformation. No ventriculomegaly. 2. Expected evolution of the July Left MCA infarct. 10/9 CT Angiogram Neck/Head W/WO contrast:Negative for emergent large vessel occlusion, and no large vessel stenosis identified. 2. The proximal Right PICA remains patent, and there is no definite acute abnormality of the non-dominant Right Vertebral Artery. 3. The left vertebral artery is dominant. The anterior and  posterior circulation appear stable since the July MRA. There is minimal for age carotid atherosclerosis. 4. Stable CT appearance of right PICA territory infarct from earlier today with no hemorrhage or  significant mass effect. 10/9 MRI brain:Acute right PICA territory infarct with mild petechial hemorrhage. 2. Remote left MCA branch infarct 10/10 Echocardiogram: LVEF= 55%- 60%.  10/10 CT abdomen pelvis W contrast/ CT chest:- heart is mildly enlarged. -MV valve annular repair is noted.-CABG is noted.  -Mediastinum/Nodes: No significant mediastinal or axillary adenopathy -Lungs/Pleura: clear without focal nodule, mass, or airspace disease.  - Adrenal glands are normal bilaterally. 15 mm cyst is present at the lower pole of the right kidney. Other smaller cysts are present at the lower pole both kidneys. A 10 mm interpolar cyst is present on the left.  -Stomach/Bowel:  retrocecal appendix is within normal limits. The ascending and transverse colon  Normal -Partial descending colectomy is noted. Anastomosis intact. Moderate stool distends the rectum to 7 cm in transverse diameter without obstruction. -Prostate is enlarged, measuring 5.2 cm in transverse diameter. - Leftward curvature is present in the lumbar spine, centered at L4. Degenerative endplate changes are present. No focal lytic or blastic lesions are present. - left SI joint is fused. Degenerative changes are present at the right SI joint. No focal lytic or blastic lesions are present. The hips are located bilaterally. No focal lesions are present.   IMPRESSION: 1. No evidence for adenopathy. 2. No distal metastatic disease. 3. Partial descending colectomy.  The anastomosis is intact. 4. Moderate stool distends the rectum to 7 mm in transverse diameter. 5. Enlarged prostate gland measures up to 5.2 cm in transverse diameter. 6.  Aortic Atherosclerosis (ICD10-I70.0). 7. Coronary artery disease.      Consultations: Stroke team Cardiology     Discharge Exam: Vitals:   12/01/16 2300 12/02/16 0413 12/02/16 0800 12/02/16 1149  BP: 121/79 (!) 146/81 128/76 122/66  Pulse: 71 72 70 70  Resp: 13 14 15 14   Temp:  97.8 F  (36.6 C) 98 F (36.7 C) 98.1 F (36.7 C)  TempSrc:  Oral Oral Oral  SpO2: 93% 98% 98% 98%  Weight:  178 lb 4.8 oz (80.9 kg)    Height:        Downstairs evaluating unstable patients. Per RN Kirsten patient and wife unwilling to wait for me resolve issues with unstable patients and round on him. Patient seen by neurologist today, and cardiologist last night both of which signed off on patient as stable for discharge. No charge to patient   Discharge Instructions  Discharge Instructions    Ambulatory referral to Neurology    Complete by:  As directed    Follow up with Dr. [name] in 6 weeks at [facility]. Thanks.     Allergies as of 12/02/2016      Reactions   Statins Other (See Comments)   Myalgias (intolerance)      Medication List    STOP taking these medications   hydrochlorothiazide 25 MG tablet Commonly known as:  HYDRODIURIL   metoprolol tartrate 25 MG tablet Commonly known as:  LOPRESSOR   UNABLE TO FIND     TAKE these medications   ALPRAZolam 0.25 MG tablet Commonly known as:  XANAX Take 1-2 tablets (0.25-0.5 mg total) by mouth 2 (two) times daily as needed for anxiety.   Arginine 500 MG Caps Take 1,500 mg by mouth daily.   aspirin EC 81 MG tablet Take 81 mg by mouth at bedtime.   clopidogrel  75 MG tablet Commonly known as:  PLAVIX Take 1 tablet (75 mg total) by mouth daily. What changed:  when to take this   ezetimibe 10 MG tablet Commonly known as:  ZETIA Take 1 tablet (10 mg total) by mouth daily. What changed:  when to take this   FISH OIL PO Take 1,000 mg by mouth daily.   gabapentin 300 MG capsule Commonly known as:  NEURONTIN Take 1 capsule (300 mg total) by mouth at bedtime. What changed:  when to take this   JOINT HEALTH PO Take 1 tablet by mouth daily. JARROW JOINT BUILDER   loratadine 10 MG tablet Commonly known as:  CLARITIN Take 10 mg by mouth at bedtime.   LYCOPENE PO Take 1 capsule by mouth daily. JARROW FORMULA LYCO-SORB  PROSTATE SUPPORT   OCUVITE PRESERVISION PO Take 1 tablet by mouth 2 (two) times daily.   omeprazole 40 MG capsule Commonly known as:  PRILOSEC Take 1 capsule (40 mg total) by mouth daily. What changed:  when to take this  reasons to take this   RED YEAST RICE PO Take 1 tablet by mouth 2 (two) times daily. MORNING & AFTERNOON   rosuvastatin 20 MG tablet Commonly known as:  CRESTOR Take 1 tablet (20 mg total) by mouth at bedtime.      Allergies  Allergen Reactions  . Statins Other (See Comments)    Myalgias (intolerance)   Follow-up Information    [facility] Follow up on 12/15/2016.   Specialty:  Cardiology Why:  2:30PM, wound check visit  Contact information: [address] [place]       Cameron Sprang, MD. Schedule an appointment as soon as possible for a visit in 6 week(s).   Specialty:  Neurology Contact information: [place] [address] [phone]            The results of significant diagnostics from this hospitalization (including imaging, microbiology, ancillary and laboratory) are listed below for reference.    Significant Diagnostic Studies: Ct Angio Head W Or Wo Contrast  Result Date: 11/30/2016 CLINICAL DATA:  76 year old male with right PICA infarct detected on CT today performed for 2 days of dizziness and headache. History of Left MCA infarct in July. EXAM: CT ANGIOGRAPHY HEAD AND NECK TECHNIQUE: Multidetector CT imaging of the head and neck was performed using the standard protocol during bolus administration of intravenous contrast. Multiplanar CT image reconstructions and MIPs were obtained to evaluate the vascular anatomy. Carotid stenosis measurements (when applicable) are obtained utilizing NASCET criteria, using the distal internal carotid diameter as the denominator. CONTRAST:  50 mL Isovue 370 COMPARISON:  Head CT without contrast  1121 hours today. Brain MRI and intracranial MRA 09/12/2016. FINDINGS: CTA NECK Skeleton: Previous sternotomy. Chronic disc and endplate degeneration throughout the cervical spine. No acute osseous abnormality identified. Visualized paranasal sinuses and mastoids are stable and well pneumatized. Upper chest: Negative lung parenchyma. No superior mediastinal lymphadenopathy. Other neck: Negative.  No cervical lymphadenopathy. Aortic arch: 3 vessel arch configuration. Mild for age arch atherosclerosis. Right carotid system: Negative, minimal atherosclerosis. Left carotid system: Negative, minimal atherosclerosis. Vertebral arteries: No proximal right subclavian atherosclerosis. There is calcified the right vertebral artery origin, but no significant stenosis) series 9 image 68). Non dominant right vertebral artery is intermittently tortuous but patent to the skullbase without stenosis. Mild proximal left subclavian artery atherosclerosis without stenosis. Dominant left vertebral artery with  widely patent origin (series 5, image 151). Ectatic left vertebral artery is widely patent to the skullbase. CTA HEAD Posterior circulation: On the July MRA of the distal right vertebral artery was non dominant, and the right vertebral V4 segment today has a similar appearance. Mild if any interval irregularity of the distal right vertebral. Also, the right PICA origin and proximal segment do remain patent (series 7, image 77). The dominant distal left vertebral artery is widely patent to the vertebrobasilar junction with mild tortuosity. Patent basilar artery without stenosis. Ectasia of the basilar tip with patent SCA and PCA origins. Tortuous P1 segments. Posterior communicating arteries are diminutive or absent. Normal bilateral PCA branches. Anterior circulation: Patent mildly ectatic bilateral ICA siphons. Mild calcified siphon atherosclerosis with no stenosis. Normal ophthalmic artery origins. Patent carotid termini. Normal  MCA and ACA origins. Tortuous A1 segments with azygos ACA A2 anatomy re- demonstrated. Visible ACA branches are within normal limits. Left MCA M1 segment tortuosity with early bifurcation again noted. Left MCA branches are within normal limits. Right MCA M1 segment is tortuous. Right MCA bifurcation and right MCA branches are within normal limits. Venous sinuses: Patent on the delayed images. Anatomic variants: Dominant left vertebral artery. Azygos type ACA A2 anatomy. Delayed phase: Continued cytotoxic edema in the inferior left cerebellum. No abnormal enhancement identified. Stable mild posterior fossa mass effect with no ventriculomegaly. No acute intracranial hemorrhage identified. Stable gray-white matter differentiation throughout the brain. Review of the MIP images confirms the above findings IMPRESSION: 1. Negative for emergent large vessel occlusion, and no large vessel stenosis identified. 2. The proximal Right PICA remains patent, and there is no definite acute abnormality of the non-dominant Right Vertebral Artery. 3. The left vertebral artery is dominant. The anterior and posterior circulation appear stable since the July MRA. There is minimal for age carotid atherosclerosis. 4. Stable CT appearance of right PICA territory infarct from earlier today with no hemorrhage or significant mass effect. Electronically Signed   By: Genevie Ann M.D.   On: 11/30/2016 14:06   Dg Chest 2 View  Result Date: 11/30/2016 CLINICAL DATA:  Stroke. EXAM: CHEST  2 VIEW COMPARISON:  Chest x-ray 09/14/2016. FINDINGS: Prior cardiac valve replacement. Heart size normal. No pulmonary venous congestion. Mild left base subsegmental atelectasis. No focal alveolar infiltrate. No pleural effusion or pneumothorax. IMPRESSION: 1.  Prior cardiac valve replacement.  Heart size normal. 2. Mild left base subsegmental atelectasis. Electronically Signed   By: Marcello Moores  Register   On: 11/30/2016 14:23   Ct Head Wo Contrast  Result Date:  11/30/2016 CLINICAL DATA:  76 year old male status post left MCA infarct 1 month ago. New onset dizziness and headache for 2 days. EXAM: CT HEAD WITHOUT CONTRAST TECHNIQUE: Contiguous axial images were obtained from the base of the skull through the vertex without intravenous contrast. COMPARISON:  Brain MRI and intracranial MRA 09/12/2016. FINDINGS: Brain: Hypodense encephalomalacia corresponding to the posterior left insula and operculum area of restricted diffusion in July (series 3, image 19). The remaining supratentorial gray-white matter differentiation is normal. Confluent hypodensity throughout the right cerebellar tonsil and lower hemisphere (series 3, image 5). Partial effacement of the fourth ventricle, but overall mild posterior fossa mass effect. Petechial if any associated blood products. No malignant hemorrhagic transformation. The left cerebellar hemisphere is spared. No ventriculomegaly. No midline shift. No extra-axial collection. Vascular: Calcified atherosclerosis at the skull base. Skull: Intact.  No acute osseous abnormality identified. Sinuses/Orbits: Visualized paranasal sinuses and mastoids are stable and well pneumatized. Other:  Visualized orbits and scalp soft tissues are within normal limits. IMPRESSION: 1. Acute to subacute Right PICA infarct affecting the cerebellum. Mild regional mass effect. No malignant hemorrhagic transformation. No ventriculomegaly. 2. Expected evolution of the July Left MCA infarct. Electronically Signed   By: Genevie Ann M.D.   On: 11/30/2016 11:33   Ct Angio Neck W And/or Wo Contrast  Result Date: 11/30/2016 CLINICAL DATA:  76 year old male with right PICA infarct detected on CT today performed for 2 days of dizziness and headache. History of Left MCA infarct in July. EXAM: CT ANGIOGRAPHY HEAD AND NECK TECHNIQUE: Multidetector CT imaging of the head and neck was performed using the standard protocol during bolus administration of intravenous contrast.  Multiplanar CT image reconstructions and MIPs were obtained to evaluate the vascular anatomy. Carotid stenosis measurements (when applicable) are obtained utilizing NASCET criteria, using the distal internal carotid diameter as the denominator. CONTRAST:  50 mL Isovue 370 COMPARISON:  Head CT without contrast 1121 hours today. Brain MRI and intracranial MRA 09/12/2016. FINDINGS: CTA NECK Skeleton: Previous sternotomy. Chronic disc and endplate degeneration throughout the cervical spine. No acute osseous abnormality identified. Visualized paranasal sinuses and mastoids are stable and well pneumatized. Upper chest: Negative lung parenchyma. No superior mediastinal lymphadenopathy. Other neck: Negative.  No cervical lymphadenopathy. Aortic arch: 3 vessel arch configuration. Mild for age arch atherosclerosis. Right carotid system: Negative, minimal atherosclerosis. Left carotid system: Negative, minimal atherosclerosis. Vertebral arteries: No proximal right subclavian atherosclerosis. There is calcified the right vertebral artery origin, but no significant stenosis) series 9 image 68). Non dominant right vertebral artery is intermittently tortuous but patent to the skullbase without stenosis. Mild proximal left subclavian artery atherosclerosis without stenosis. Dominant left vertebral artery with widely patent origin (series 5, image 151). Ectatic left vertebral artery is widely patent to the skullbase. CTA HEAD Posterior circulation: On the July MRA of the distal right vertebral artery was non dominant, and the right vertebral V4 segment today has a similar appearance. Mild if any interval irregularity of the distal right vertebral. Also, the right PICA origin and proximal segment do remain patent (series 7, image 77). The dominant distal left vertebral artery is widely patent to the vertebrobasilar junction with mild tortuosity. Patent basilar artery without stenosis. Ectasia of the basilar tip with patent SCA and  PCA origins. Tortuous P1 segments. Posterior communicating arteries are diminutive or absent. Normal bilateral PCA branches. Anterior circulation: Patent mildly ectatic bilateral ICA siphons. Mild calcified siphon atherosclerosis with no stenosis. Normal ophthalmic artery origins. Patent carotid termini. Normal MCA and ACA origins. Tortuous A1 segments with azygos ACA A2 anatomy re- demonstrated. Visible ACA branches are within normal limits. Left MCA M1 segment tortuosity with early bifurcation again noted. Left MCA branches are within normal limits. Right MCA M1 segment is tortuous. Right MCA bifurcation and right MCA branches are within normal limits. Venous sinuses: Patent on the delayed images. Anatomic variants: Dominant left vertebral artery. Azygos type ACA A2 anatomy. Delayed phase: Continued cytotoxic edema in the inferior left cerebellum. No abnormal enhancement identified. Stable mild posterior fossa mass effect with no ventriculomegaly. No acute intracranial hemorrhage identified. Stable gray-white matter differentiation throughout the brain. Review of the MIP images confirms the above findings IMPRESSION: 1. Negative for emergent large vessel occlusion, and no large vessel stenosis identified. 2. The proximal Right PICA remains patent, and there is no definite acute abnormality of the non-dominant Right Vertebral Artery. 3. The left vertebral artery is dominant. The anterior and posterior circulation appear  stable since the July MRA. There is minimal for age carotid atherosclerosis. 4. Stable CT appearance of right PICA territory infarct from earlier today with no hemorrhage or significant mass effect. Electronically Signed   By: Genevie Ann M.D.   On: 11/30/2016 14:06   Ct Chest W Contrast  Result Date: 12/01/2016 CLINICAL DATA:  Lung cancer, staging. EXAM: CT CHEST, ABDOMEN, AND PELVIS WITH CONTRAST TECHNIQUE: Multidetector CT imaging of the chest, abdomen and pelvis was performed following the  standard protocol during bolus administration of intravenous contrast. CONTRAST:  <See Chart> ISOVUE-300 IOPAMIDOL (ISOVUE-300) INJECTION 61% COMPARISON:  CT chest 06/07/2014 FINDINGS: CT CHEST FINDINGS Cardiovascular: The heart is mildly enlarged. Coronary artery calcifications are present. Mitral valve annular repair is noted. CABG is noted. Atherosclerotic calcifications are present at the aortic arch and descending aorta without aneurysm. There is no focal stenosis of the great vessel origins. Mediastinum/Nodes: No significant mediastinal or axillary adenopathy is present. Lungs/Pleura: The lungs are clear without focal nodule, mass, or airspace disease. Mild dependent atelectasis is present bilaterally. There is no significant pleural effusion. Musculoskeletal: Vertebral body heights are maintained. No focal lytic or blastic lesions are present. The ribs are unremarkable. CT ABDOMEN PELVIS FINDINGS Hepatobiliary: No focal liver abnormality is seen. No gallstones, gallbladder wall thickening, or biliary dilatation. Pancreas: Unremarkable. No pancreatic ductal dilatation or surrounding inflammatory changes. Spleen: Normal in size without focal abnormality. Adrenals/Urinary Tract: Adrenal glands are normal bilaterally. 15 mm cyst is present at the lower pole of the right kidney. Other smaller cysts are present at the lower pole both kidneys. A 10 mm interpolar cyst is present on the left. No focal stone or mass lesion is present. Ureters are within normal limits. The urinary bladder is unremarkable. Stomach/Bowel: The stomach and duodenum are within normal limits. The small bowel is unremarkable. The a retrocecal appendix is within normal limits. The ascending and transverse colon are within normal limits. Partial descending colectomy is noted. The anastomosis is is intact. Moderate stool distends the rectum to 7 cm in transverse diameter without obstruction. Vascular/Lymphatic: No significant pelvic or presacral  adenopathy is present. Atherosclerotic calcifications are present in the aorta and branch vessels without aneurysm. Reproductive: Prostate is enlarged, measuring 5.2 cm in transverse diameter. Other: No abdominal wall hernia or abnormality. No abdominopelvic ascites. Musculoskeletal: Mild leftward curvature is present in the lumbar spine, centered at L4. Degenerative endplate changes are present. No focal lytic or blastic lesions are present. The left SI joint is fused. Degenerative changes are present at the right SI joint. No focal lytic or blastic lesions are present. The hips are located bilaterally. No focal lesions are present. IMPRESSION: 1. No evidence for adenopathy. 2. No distal metastatic disease. 3. Partial descending colectomy.  The anastomosis is intact. 4. Moderate stool distends the rectum to 7 mm in transverse diameter. 5. Enlarged prostate gland measures up to 5.2 cm in transverse diameter. 6.  Aortic Atherosclerosis (ICD10-I70.0). 7. Coronary artery disease. Electronically Signed   By: San Morelle M.D.   On: 12/01/2016 21:04   Mr Brain Wo Contrast  Result Date: 11/30/2016 CLINICAL DATA:  Focal neuro deficit. EXAM: MRI HEAD WITHOUT CONTRAST TECHNIQUE: Multiplanar, multiecho pulse sequences of the brain and surrounding structures were obtained without intravenous contrast. COMPARISON:  09/12/2016 FINDINGS: Brain: Confluent restricted diffusion in the inferior right cerebellum. Mild petechial hemorrhage along the inferior right infarct. Fourth ventricle remains patent. No hydrocephalus. Band like facilitated diffusion in the posterior left frontal lobe where there was infarct July  2018 and gliosis on other sequences. Aside from the above described infarcts the brain has a normal appearance for age with no unexpected volume loss or white matter disease. No hydrocephalus, masslike finding, or extra-axial collection. Vascular: CTA performed earlier today. No detected change since that  study. Skull and upper cervical spine: Negative for marrow lesion. Sinuses/Orbits: No significant finding. IMPRESSION: 1. Acute right PICA territory infarct with mild petechial hemorrhage. 2. Remote left MCA branch infarct. Electronically Signed   By: Monte Fantasia M.D.   On: 11/30/2016 20:11   Ct Abdomen Pelvis W Contrast  Result Date: 12/01/2016 CLINICAL DATA:  Lung cancer, staging. EXAM: CT CHEST, ABDOMEN, AND PELVIS WITH CONTRAST TECHNIQUE: Multidetector CT imaging of the chest, abdomen and pelvis was performed following the standard protocol during bolus administration of intravenous contrast. CONTRAST:  <See Chart> ISOVUE-300 IOPAMIDOL (ISOVUE-300) INJECTION 61% COMPARISON:  CT chest 06/07/2014 FINDINGS: CT CHEST FINDINGS Cardiovascular: The heart is mildly enlarged. Coronary artery calcifications are present. Mitral valve annular repair is noted. CABG is noted. Atherosclerotic calcifications are present at the aortic arch and descending aorta without aneurysm. There is no focal stenosis of the great vessel origins. Mediastinum/Nodes: No significant mediastinal or axillary adenopathy is present. Lungs/Pleura: The lungs are clear without focal nodule, mass, or airspace disease. Mild dependent atelectasis is present bilaterally. There is no significant pleural effusion. Musculoskeletal: Vertebral body heights are maintained. No focal lytic or blastic lesions are present. The ribs are unremarkable. CT ABDOMEN PELVIS FINDINGS Hepatobiliary: No focal liver abnormality is seen. No gallstones, gallbladder wall thickening, or biliary dilatation. Pancreas: Unremarkable. No pancreatic ductal dilatation or surrounding inflammatory changes. Spleen: Normal in size without focal abnormality. Adrenals/Urinary Tract: Adrenal glands are normal bilaterally. 15 mm cyst is present at the lower pole of the right kidney. Other smaller cysts are present at the lower pole both kidneys. A 10 mm interpolar cyst is present on the  left. No focal stone or mass lesion is present. Ureters are within normal limits. The urinary bladder is unremarkable. Stomach/Bowel: The stomach and duodenum are within normal limits. The small bowel is unremarkable. The a retrocecal appendix is within normal limits. The ascending and transverse colon are within normal limits. Partial descending colectomy is noted. The anastomosis is is intact. Moderate stool distends the rectum to 7 cm in transverse diameter without obstruction. Vascular/Lymphatic: No significant pelvic or presacral adenopathy is present. Atherosclerotic calcifications are present in the aorta and branch vessels without aneurysm. Reproductive: Prostate is enlarged, measuring 5.2 cm in transverse diameter. Other: No abdominal wall hernia or abnormality. No abdominopelvic ascites. Musculoskeletal: Mild leftward curvature is present in the lumbar spine, centered at L4. Degenerative endplate changes are present. No focal lytic or blastic lesions are present. The left SI joint is fused. Degenerative changes are present at the right SI joint. No focal lytic or blastic lesions are present. The hips are located bilaterally. No focal lesions are present. IMPRESSION: 1. No evidence for adenopathy. 2. No distal metastatic disease. 3. Partial descending colectomy.  The anastomosis is intact. 4. Moderate stool distends the rectum to 7 mm in transverse diameter. 5. Enlarged prostate gland measures up to 5.2 cm in transverse diameter. 6.  Aortic Atherosclerosis (ICD10-I70.0). 7. Coronary artery disease. Electronically Signed   By: San Morelle M.D.   On: 12/01/2016 21:04    Microbiology: Recent Results (from the past 240 hour(s))  MRSA PCR Screening     Status: None   Collection Time: 11/30/16  2:49 PM  Result Value  Ref Range Status   MRSA by PCR NEGATIVE NEGATIVE Final    Comment:        The GeneXpert MRSA Assay (FDA approved for NASAL specimens only), is one component of a comprehensive  MRSA colonization surveillance program. It is not intended to diagnose MRSA infection nor to guide or monitor treatment for MRSA infections.      Labs: Basic Metabolic Panel:  Recent Labs Lab 11/30/16 1059  NA 135  K 3.6  CL 96*  CO2 27  GLUCOSE 113*  BUN 17  CREATININE 0.96  CALCIUM 9.5   Liver Function Tests:  Recent Labs Lab 11/30/16 1059  AST 44*  ALT 47  ALKPHOS 49  BILITOT 1.4*  PROT 7.3  ALBUMIN 4.3   No results for input(s): LIPASE, AMYLASE in the last 168 hours. No results for input(s): AMMONIA in the last 168 hours. CBC:  Recent Labs Lab 11/30/16 1059  WBC 10.0  NEUTROABS 8.7*  HGB 15.3  HCT 45.8  MCV 83.9  PLT 201   Cardiac Enzymes: No results for input(s): CKTOTAL, CKMB, CKMBINDEX, TROPONINI in the last 168 hours. BNP: BNP (last 3 results) No results for input(s): BNP in the last 8760 hours.  ProBNP (last 3 results) No results for input(s): PROBNP in the last 8760 hours.  CBG: No results for input(s): GLUCAP in the last 168 hours.     Signed:  Dia Crawford, MD [facility] [phone] pager

## 2016-12-02 NOTE — Progress Notes (Signed)
STROKE TEAM PROGRESS NOTE   SUBJECTIVE (INTERVAL HISTORY) His wife is at the bedside. No acute event overnight. He complained of headache last night. Currently no HA. He had pan CT no malignancy. Loop recorder placed yesterday.     OBJECTIVE Temp:  [97.4 F (36.3 C)-98.9 F (37.2 C)] 98.1 F (36.7 C) (10/11 1149) Pulse Rate:  [67-78] 70 (10/11 1149) Cardiac Rhythm: Normal sinus rhythm (10/11 0843) Resp:  [13-17] 14 (10/11 1149) BP: (121-146)/(66-83) 122/66 (10/11 1149) SpO2:  [93 %-99 %] 98 % (10/11 1149) Weight:  [178 lb 4.8 oz (80.9 kg)] 178 lb 4.8 oz (80.9 kg) (10/11 0413)  No results for input(s): GLUCAP in the last 168 hours.  Recent Labs Lab 11/30/16 1059  NA 135  K 3.6  CL 96*  CO2 27  GLUCOSE 113*  BUN 17  CREATININE 0.96  CALCIUM 9.5    Recent Labs Lab 11/30/16 1059  AST 44*  ALT 47  ALKPHOS 49  BILITOT 1.4*  PROT 7.3  ALBUMIN 4.3    Recent Labs Lab 11/30/16 1059  WBC 10.0  NEUTROABS 8.7*  HGB 15.3  HCT 45.8  MCV 83.9  PLT 201   No results for input(s): CKTOTAL, CKMB, CKMBINDEX, TROPONINI in the last 168 hours.  Recent Labs  11/30/16 1059  LABPROT 14.5  INR 1.14    Recent Labs  11/30/16 1109  COLORURINE AMBER*  LABSPEC 1.027  PHURINE 5.0  GLUCOSEU NEGATIVE  HGBUR NEGATIVE  BILIRUBINUR NEGATIVE  KETONESUR 80*  PROTEINUR >=300*  NITRITE NEGATIVE  LEUKOCYTESUR NEGATIVE       Component Value Date/Time   CHOL 119 11/30/2016 1059   TRIG 76 11/30/2016 1059   HDL 44 11/30/2016 1059   CHOLHDL 2.7 11/30/2016 1059   VLDL 15 11/30/2016 1059   LDLCALC 60 11/30/2016 1059   Lab Results  Component Value Date   HGBA1C 5.5 11/30/2016      Component Value Date/Time   LABOPIA NONE DETECTED 11/30/2016 1109   COCAINSCRNUR NONE DETECTED 11/30/2016 1109   LABBENZ NONE DETECTED 11/30/2016 1109   AMPHETMU NONE DETECTED 11/30/2016 1109   THCU NONE DETECTED 11/30/2016 1109   LABBARB NONE DETECTED 11/30/2016 1109     Recent Labs Lab  11/30/16 1432  ETH <10    I have personally reviewed the radiological images below and agree with the radiology interpretations.  Ct Angio Head and neck W Or Wo Contrast 11/30/2016 IMPRESSION: 1. Negative for emergent large vessel occlusion, and no large vessel stenosis identified. 2. The proximal Right PICA remains patent, and there is no definite acute abnormality of the non-dominant Right Vertebral Artery. 3. The left vertebral artery is dominant. The anterior and posterior circulation appear stable since the July MRA. There is minimal for age carotid atherosclerosis. 4. Stable CT appearance of right PICA territory infarct from earlier today with no hemorrhage or significant mass effect.   Dg Chest 2 View 11/30/2016 IMPRESSION: 1.  Prior cardiac valve replacement.  Heart size normal. 2. Mild left base subsegmental atelectasis.   Ct Head Wo Contrast 11/30/2016 IMPRESSION: 1. Acute to subacute Right PICA infarct affecting the cerebellum. Mild regional mass effect. No malignant hemorrhagic transformation. No ventriculomegaly. 2. Expected evolution of the July Left MCA infarct.   Mr Brain Wo Contrast 11/30/2016 IMPRESSION: 1. Acute right PICA territory infarct with mild petechial hemorrhage. 2. Remote left MCA branch infarct.   2D Echocardiogram   - Left ventricle: The cavity size was normal. Systolic function was   normal. The estimated  ejection fraction was in the range of 55%   to 60%. Wall motion was normal; there were no regional wall   motion abnormalities. - Aortic valve: Trileaflet; mildly thickened, mildly calcified   leaflets. - Mitral valve: Calcified annulus. Valve area by pressure   half-time: 1.82 cm^2. - Left atrium: The atrium was mildly dilated. - Pulmonic valve: There was mild regurgitation.  TEE 09/2016 - Left ventricle: There was mild concentric hypertrophy. Systolic   function was normal. The estimated ejection fraction was in the   range of 60% to 65%. Wall  motion was normal; there were no   regional wall motion abnormalities. - Aortic valve: Structurally normal valve. Trileaflet. Cusp   separation was normal. - Mitral valve: Prior annuloplasty repair. No stenosis. Trivial MR. - Left atrium: The atrium was dilated. The left atrial appendage   was surgically closed and is flush occluded. No flow is noted in   teh appendage by color doppler. No thrombus was noted in the left   atrium. - Right atrium: No evidence of thrombus in the atrial cavity or   appendage. - Atrial septum: No defect or patent foramen ovale was identified. Impressions: - No cardiac source of emboli was indentified. The left atrial   appendage is surgically closed.  Ct chest Abdomen Pelvis W Contrast 12/01/2016 IMPRESSION: 1. No evidence for adenopathy. 2. No distal metastatic disease. 3. Partial descending colectomy.  The anastomosis is intact. 4. Moderate stool distends the rectum to 7 mm in transverse diameter. 5. Enlarged prostate gland measures up to 5.2 cm in transverse diameter. 6.  Aortic Atherosclerosis (ICD10-I70.0). 7. Coronary artery disease.     PHYSICAL EXAM  Temp:  [97.4 F (36.3 C)-98.9 F (37.2 C)] 98.1 F (36.7 C) (10/11 1149) Pulse Rate:  [67-78] 70 (10/11 1149) Resp:  [13-17] 14 (10/11 1149) BP: (121-146)/(66-83) 122/66 (10/11 1149) SpO2:  [93 %-99 %] 98 % (10/11 1149) Weight:  [178 lb 4.8 oz (80.9 kg)] 178 lb 4.8 oz (80.9 kg) (10/11 0413)  General - Well nourished, well developed, in no apparent distress.  Ophthalmologic - Sharp disc margins OU.  Cardiovascular - Regular rate and rhythm with no murmur.  Mental Status -  Level of arousal and orientation to time, place, and person were intact. Language including expression, naming, repetition, comprehension was assessed and found intact. Attention span and concentration were normal. Fund of Knowledge was assessed and was intact.  Cranial Nerves II - XII - II - Visual field intact  OU. III, IV, VI - Extraocular movements intact. V - Facial sensation intact bilaterally. VII - Facial movement intact bilaterally. VIII - Hearing & vestibular intact bilaterally. X - Palate elevates symmetrically. XI - Chin turning & shoulder shrug intact bilaterally. XII - Tongue protrusion intact.  Motor Strength - The patient's strength was normal in all extremities and pronator drift was absent.  Bulk was normal and fasciculations were absent.   Motor Tone - Muscle tone was assessed at the neck and appendages and was normal.  Reflexes - The patient's reflexes were symmetrical in all extremities and he had no pathological reflexes.  Sensory - Light touch, temperature/pinprick were assessed and were symmetrical.    Coordination - The patient had normal movements in the hands and feet with no ataxia or dysmetria.  Tremor was absent.  Gait and Station - deferred   ASSESSMENT/PLAN [name] is a 76 y.o. male with history of HTN, MVR 13 years ago, HLD, colon cancer s/p colectomy in 1990,  recent left MCA infarct in 08/2016 admitted for vertigo, imbalance. Symptoms rapidly improving.    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 Along with his recent left MCA stroke, concerning for cardioembolic source. Need loop recorder placement. Will rule out malignancy first due to hx of colon cancer.  MRI  Left PICA infarct, old left MCA infarct  CTA head and neck - unremarkable, right VA ends up at PICA largely, but still has tiny branch to BA  2D Echo  EF 55-60%  CT chest/abd/pelvis no malignancy seen  loop recorder placeed.   LDL 60  HgbA1c 5.5  SCDs for VTE prophylaxis  Diet Heart Room service appropriate? Yes; Fluid consistency: Thin   aspirin 81 mg daily and clopidogrel 75 mg daily prior to admission, now on aspirin 81 mg daily and clopidogrel 75 mg daily. Continue DAPT for 3 months and then plavix alone.   Patient counseled to be compliant with  his antithrombotic medications  Ongoing aggressive stroke risk factor management  Therapy recommendations:  pending  Disposition:  Pending  Hx of stroke  08/2016 - left MCA infarcts, MRA neg, CUS neg, EF 60-65%, LDL 124 and A1C 5.5. He was put on DAPT on discharge  outpt TEE with Dr. Debara Pickett on 10/05/16 - no PFO, LAA surgically closed, no cardiac source of stroke  48 hr holter monitor negative  Loop recorder not put in at that time  Hypertension  Home meds:   metoprolol Permissive hypertension (OK if <220/120) for 24-48 hours post stroke and then gradually normalized within 5-7 days. Stable  Hyperlipidemia  Home meds:  crestor 20   Currently crestor resumed   LDL 60, goal < 70  Continue statin at discharge  Other Stroke Risk Factors  Advanced age  MVR 13 years ago  Other Active Problems  None   LAA surgically closed  Hospital day # 2  Neurology will sign off. Please call with questions. Pt will follow up with Dr. [name] at [facility] in about 6 weeks. Thanks for the consult.   Rosalin Hawking, MD PhD Stroke Neurology 12/02/2016 12:07 PM    To contact Stroke Continuity provider, please refer to [email or web address] After hours, contact General Neurology

## 2016-12-02 NOTE — Discharge Instructions (Signed)
Keep incision clean and dry for 3 days. You can remove outer dressing tomorrow. Leave steri-strips (little pieces of tape) on until seen in the office for wound check appointment. Call the office [phone]) for redness, drainage, swelling, or fever.

## 2016-12-02 NOTE — Plan of Care (Signed)
Problem: Pain Managment: Goal: General experience of comfort will improve Outcome: Progressing Patient had continued complaints of headache. Oxy did not help patient. PRN Tylenol given x2 which patient had relief from and was able to rest.   Problem: Activity: Goal: Risk for activity intolerance will decrease Outcome: Progressing Patient up to restroom during shift, tolerating activity.

## 2016-12-02 NOTE — Progress Notes (Signed)
Patient and family received discharge information and prescription. Patient acknowledged understanding of information. Patient IV was removed.

## 2016-12-07 ENCOUNTER — Ambulatory Visit (INDEPENDENT_AMBULATORY_CARE_PROVIDER_SITE_OTHER): Payer: PPO | Admitting: Neurology

## 2016-12-07 ENCOUNTER — Encounter: Payer: Self-pay | Admitting: Neurology

## 2016-12-07 ENCOUNTER — Telehealth: Payer: Self-pay | Admitting: Internal Medicine

## 2016-12-07 VITALS — BP 140/72 | HR 59 | Ht 70.0 in | Wt 181.0 lb

## 2016-12-07 DIAGNOSIS — I639 Cerebral infarction, unspecified: Secondary | ICD-10-CM | POA: Diagnosis not present

## 2016-12-07 NOTE — Patient Instructions (Signed)
1. Continue aspirin and Plavix daily 2. Continue metoprolol 25mg  twice a day 3. Continue follow-up with PCP and Cardiology 4. Look into the following support groups:  Stroke [place]  This support group for stroke survivors, their families and friends meets on the second Thursday of each month from 3 to 4 p.m. (September-May) at [employer]. [facility], [address], [place]. For more information, call Benay Pillow at [phone] between 8 a.m. and 5 p.m. Monday through Thursday.  Stroke [place] Provides support and education to stroke survivors and their families. When: The fourth Tuesday of each month from noon until 1:30 p.m., March through December. Info: Reservations are appreciated but not required. For reservations or more information, call [phone].  5. Follow-up as scheduled in February

## 2016-12-07 NOTE — Progress Notes (Signed)
NEUROLOGY FOLLOW UP OFFICE NOTE  Wise Fees [id number] [birth year]  HISTORY OF PRESENT ILLNESS: I had the pleasure of seeing Kevin Bass in follow-up in the neurology clinic on 12/07/2016.  He is again accompanied by his wife who helps supplement the history today. The patient was last seen 2 months ago after a left MCA stroke in July 2018 when he had transient speech difficulties. Records and images were personally reviewed where available. He presents for an earlier visit due to having another stroke in a period of 3 months. He had no residual deficits from prior stroke, then on 11/29/16 he had sudden onset vertigo that made him fall backward. This lasted for 15 seconds and was followed by a mild headache. He called triage and was instructed to go seek medical attention the next day. He went to the ER and was found to have a right PICA territory stroke. He was evaluated by Neurology with no focal deficits seen. He reports he was taking aspirin and Plavix when the stroke occurred. He had an extensive workup for the prior stroke, he had a TEE in August 2018 which showed normal left atrium, no thrombus seen. He had a 48-hour monitor which was unremarkable. In the hospital, he had a CTA of the head and neck which did not show any occlusion or stenosis. Repeat TTE showed EF of 55-60%, left atrium mildly dilated. He had a CT chest/abdomen/pelvis which did not show any evidence of malignancy. On hospital discharge, he was instructed to continue aspirin and Plavix and stop his antihypertensives. He had a loop recorder placed last week. He reports overall feeling good, no further vertigo. He denies any headaches, diplopia, dysarthria/dysphagia, focal numbness/tingling/weakness. He was noting his BP was going up and restarted metoprolol 25mg  BID. He will discuss HCTZ with his PCP next week.   HPI 10/01/2016:  This is a pleasant 76 yo RH man with a history of colon cancer, hyperlipidemia, previous  mitral valve repair in his usual state of health until 09/11/16 after taking a walk, he saw his brother-in-law and when he started noticing word-finding difficulties. His wife reported that his speech was not necessarily slurred but he was having trouble finding his words. When symptoms were unchanged the next day, he decided to go to the ER. He was still noted to have mild word-finding difficulties. There was no focal numbness/tingling/weakness. He denied any headaches, dizziness, diplopia, dysarthria/dysphagia. I personally reviewed MRI brain without contrast which showed an acute infarct in the left posterior insula and parietal operculum. MRA head and carotid dopplers did not show any significant stenosis. Echocardiogram showed EF 60-65%, mild LVH, prior MV repair without significant stenosis or regurgitation, normal LA size. He was switched from aspirin to Plavix. LDL was 124, HbA1c was 5.6. He was unable to get a TEE in the hospital, and has seen Dr. Debara Pickett and agreed to having the procedure done on 8/14 followed by loop recorder. He denies any palpitations, chest pain, shortness of breath. No neck/back pain, bowel/bladder dysfunction. No falls. He denies any side effect on Plavix. He was started on Crestor yesterday. He feels he is 95% or more back to baseline, he denies any confusion or focal symptoms. He reports a history of facial pain on the right upper lip radiating up his cheek with good response to gabapentin. He takes 300mg  qhs with no side effects.  PAST MEDICAL HISTORY: Past Medical History:  Diagnosis Date  . Allergy   . Anxiety   . Arthritis  hands  . Colon cancer ([place])    s/p partial colectomy 1990, Cscope neg 7-09, next 2014  . DISORDER, MITRAL VALVE    MV recontruction at [place] 2006----still needs ABX for SBE prophylaxis   . Elevated PSA    [name]  . GERD (gastroesophageal reflux disease)   . Heart murmur    no problems since surgery  . HYPERGLYCEMIA, BORDERLINE 10/16/2008  .  HYPERLIPIDEMIA 06/15/2006   diet controlled  . Hypertension   . HYPERTROPHY PROSTATE W/O UR OBST & OTH LUTS 10/16/2008  . Right groin pain    Dr. Redmond Pulling  . Wears partial dentures    lower partial    MEDICATIONS: Current Outpatient Prescriptions on File Prior to Visit  Medication Sig Dispense Refill  . ALPRAZolam (XANAX) 0.25 MG tablet Take 1-2 tablets (0.25-0.5 mg total) by mouth 2 (two) times daily as needed for anxiety. 40 tablet 1  . Arginine 500 MG CAPS Take 1,500 mg by mouth daily.    Marland Kitchen aspirin EC 81 MG tablet Take 81 mg by mouth at bedtime.    . clopidogrel (PLAVIX) 75 MG tablet Take 1 tablet (75 mg total) by mouth daily. (Patient taking differently: Take 75 mg by mouth daily at 2 PM. ) 30 tablet 4  . ezetimibe (ZETIA) 10 MG tablet Take 1 tablet (10 mg total) by mouth daily. (Patient taking differently: Take 10 mg by mouth at bedtime. ) 90 tablet 1  . gabapentin (NEURONTIN) 300 MG capsule Take 1 capsule (300 mg total) by mouth at bedtime. 1 capsule 0  . Glucosamine-MSM-Hyaluronic Acd (JOINT HEALTH PO) Take 1 tablet by mouth daily. JARROW JOINT BUILDER    . loratadine (CLARITIN) 10 MG tablet Take 10 mg by mouth at bedtime.     Marland Kitchen LYCOPENE PO Take 1 capsule by mouth daily. JARROW FORMULA LYCO-SORB PROSTATE SUPPORT    . Multiple Vitamins-Minerals (OCUVITE PRESERVISION PO) Take 1 tablet by mouth 2 (two) times daily.     . Omega-3 Fatty Acids (FISH OIL PO) Take 1,000 mg by mouth daily.     Marland Kitchen omeprazole (PRILOSEC) 40 MG capsule Take 1 capsule (40 mg total) by mouth daily. (Patient taking differently: Take 40 mg by mouth daily as needed (for acid reflux). ) 90 capsule 3  . Red Yeast Rice Extract (RED YEAST RICE PO) Take 1 tablet by mouth 2 (two) times daily. MORNING & AFTERNOON    . rosuvastatin (CRESTOR) 20 MG tablet Take 1 tablet (20 mg total) by mouth at bedtime. 30 tablet 3   No current facility-administered medications on file prior to visit.     ALLERGIES: Allergies  Allergen  Reactions  . Statins Other (See Comments)    Myalgias (intolerance)    FAMILY HISTORY: Family History  Problem Relation Age of Onset  . Colon cancer Other        M side (cousins)  . Diabetes Neg Hx   . Stroke Neg Hx   . CAD Neg Hx   . Prostate cancer Neg Hx   . Rectal cancer Neg Hx   . Esophageal cancer Neg Hx     SOCIAL HISTORY: Social History   Social History  . Marital status: Married    Spouse name: suzanne  . Number of children: 2  . Years of education: college   Occupational History  . retired     Social History Main Topics  . Smoking status: Former Smoker    Packs/day: 0.50    Years: 20.00  Types: Cigarettes    Quit date: 02/23/1983  . Smokeless tobacco: Never Used  . Alcohol use 4.2 oz/week    7 Glasses of wine per week  . Drug use: No  . Sexual activity: Not on file   Other Topics Concern  . Not on file   Social History Narrative   From [place], household pt and wife   Daughter in [place], son in [place]: Constitutional: No fevers, chills, or sweats, no generalized fatigue, change in appetite Eyes: No visual changes, double vision, eye pain Ear, nose and throat: No hearing loss, ear pain, nasal congestion, sore throat Cardiovascular: No chest pain, palpitations Respiratory:  No shortness of breath at rest or with exertion, wheezes GastrointestinaI: No nausea, vomiting, diarrhea, abdominal pain, fecal incontinence Genitourinary:  No dysuria, urinary retention or frequency Musculoskeletal:  No neck pain, back pain Integumentary: No rash, pruritus, skin lesions Neurological: as above Psychiatric: No depression, insomnia, anxiety Endocrine: No palpitations, fatigue, diaphoresis, mood swings, change in appetite, change in weight, increased thirst Hematologic/Lymphatic:  No anemia, purpura, petechiae. Allergic/Immunologic: no itchy/runny eyes, nasal congestion, recent allergic reactions, rashes  PHYSICAL EXAM: Vitals:    12/07/16 1027  BP: 140/72  Pulse: (!) 59  SpO2: 94%   General: No acute distress Head:  Normocephalic/atraumatic Neck: supple, no paraspinal tenderness, full range of motion Heart:  Regular rate and rhythm Lungs:  Clear to auscultation bilaterally Back: No paraspinal tenderness Skin/Extremities: No rash, no edema Neurological Exam: alert and oriented to person, place, and time. No aphasia or dysarthria. Fund of knowledge is appropriate.  Recent and remote memory are intact.  Attention and concentration are normal.    Able to name objects and repeat phrases. Cranial nerves: Pupils equal, round, reactive to light.  Fundoscopic exam unremarkable, no papilledema. Extraocular movements intact with no nystagmus. Visual fields full. Facial sensation intact. No facial asymmetry. Tongue, uvula, palate midline.  Motor: Bulk and tone normal, muscle strength 5/5 throughout with no pronator drift.  Sensation to light touch intact.  No extinction to double simultaneous stimulation.  Deep tendon reflexes 2+ throughout, toes downgoing.  Finger to nose testing intact.  Gait narrow-based and steady, able to tandem walk adequately.  Romberg negative.  IMPRESSION: This is a pleasant 76 yo RH man with a history of colon cancer, hyperlipidemia, previous mitral valve repair with recurrent strokes. He had a left MCA branch infarct last July 2018 with transient mild aphasia. He was taking aspirin and Plavix regularly then had another stroke in the right PICA territory with transient vertigo last 11/29/16. He has had an extensive stroke workup, including malignancy evaluation for possible hypercoagulable causes, all have come back unremarkable. With strokes in different territories, cardioembolic source is likely, he now has a loop recorder. He was evaluated by stroke specialist Dr. Erlinda Hong in the hospital, with recommendation to continue aspirin and Plavix, then transition to Plavix after 3 months, but continue with loop monitor to  assess for any evidence of atrial fibrillation which would change management. We discussed continued control of vascular risk factors, he restarted metoprolol 25mg  BID due to elevated BP at home, BP today 140/72, agree with restarting antihypertensive medication. All his questions were answered, his neurological exam today is normal. He knows to go to the ER if any sudden changes occur. He will follow-up as scheduled in February and knows to call for any changes.   Thank you for allowing me to participate in  his care.  Please do not hesitate to call for any questions or concerns.  The duration of this appointment visit was 25 minutes of face-to-face time with the patient.  Greater than 50% of this time was spent in counseling, explanation of diagnosis, planning of further management, and coordination of care.   Kevin Bass, M.D.   CC: Dr. [name]

## 2016-12-07 NOTE — Telephone Encounter (Signed)
Recently discharge from the hospital after a stroke. Please check on the patient, does he need a TCM? Doing okay?

## 2016-12-08 ENCOUNTER — Telehealth: Payer: Self-pay | Admitting: Internal Medicine

## 2016-12-08 NOTE — Telephone Encounter (Signed)
Mr. Bisping made aware that he can should leave the steri-strips in place until the appt on the 24th. Confirmed remote monitoring was working. I advised him that there were no activity restrictions related to the loop recorder and that unless he has passed out or another HCP has restricted his driving he is ok to drive. He verbalizes understanding.

## 2016-12-08 NOTE — Telephone Encounter (Signed)
Kevin Bass has some questions about the loop recorder that was just placed . Please call . Thanks

## 2016-12-09 NOTE — Telephone Encounter (Signed)
Patient voiced that he's doing pretty good & has scheduled to follow-up with PCP on 12/13/16 at 10:45 AM. Also, he stated that there are some topics that he would like to discuss with Dr. [name] at the visit; no further details were provided at the time of call.

## 2016-12-09 NOTE — Telephone Encounter (Signed)
thx

## 2016-12-13 ENCOUNTER — Ambulatory Visit (INDEPENDENT_AMBULATORY_CARE_PROVIDER_SITE_OTHER): Payer: PPO | Admitting: Internal Medicine

## 2016-12-13 ENCOUNTER — Other Ambulatory Visit: Payer: Self-pay | Admitting: *Deleted

## 2016-12-13 ENCOUNTER — Encounter: Payer: Self-pay | Admitting: Internal Medicine

## 2016-12-13 VITALS — BP 116/68 | HR 63 | Temp 97.6°F | Resp 14 | Ht 70.0 in | Wt 183.5 lb

## 2016-12-13 DIAGNOSIS — E785 Hyperlipidemia, unspecified: Secondary | ICD-10-CM

## 2016-12-13 DIAGNOSIS — I639 Cerebral infarction, unspecified: Secondary | ICD-10-CM

## 2016-12-13 DIAGNOSIS — I10 Essential (primary) hypertension: Secondary | ICD-10-CM

## 2016-12-13 LAB — BASIC METABOLIC PANEL
BUN: 20 mg/dL (ref 6–23)
CO2: 30 meq/L (ref 19–32)
Calcium: 9.4 mg/dL (ref 8.4–10.5)
Chloride: 105 mEq/L (ref 96–112)
Creatinine, Ser: 0.96 mg/dL (ref 0.40–1.50)
GFR: 80.45 mL/min (ref >60.00)
GLUCOSE: 93 mg/dL (ref 70–99)
POTASSIUM: 4.3 meq/L (ref 3.5–5.1)
SODIUM: 142 meq/L (ref 135–145)

## 2016-12-13 NOTE — Progress Notes (Signed)
Pre visit review using our clinic review tool, if applicable. No additional management support is needed unless otherwise documented below in the visit note. 

## 2016-12-13 NOTE — Progress Notes (Signed)
Subjective:    Patient ID: Kevin Bass, male    DOB: [birth year], 76 y.o.   MRN: [id number]  DOS:  12/13/2016 Type of visit - description : Hospital follow-up, no TCM Interval history: See last visit, patient was admitted to the hospital with a new stroke. DX was a R PICA territory stroke, saw neurology, no focal deficits, the patient was on aspirin and Plavix. Head CTA: No obvious abnormalities. A loop was implanted. BP was noted to be controlled without medication in the hospital setting. He already saw the  Neurologist for hospital follow-up, note reviewed. Was also evaluated for malignancy, CTs neg.   Review of Systems Since he left the hospital, noted his BP to be elevated, shortly after he arrived home was 190/95, he put himself back on metoprolol 25 mg twice a day. He already had one home visit for PT OT. In general feels well. Denies chest pain, difficulty breathing or palpitations No self perceived motor deficits or slurred speech. No more headaches. No more dizziness except some imbalance when he turns his head. He has been walking around his neighborhood and driving without problems. Emotionally, he is somewhat concerned about his recent health issues.  Past Medical History:  Diagnosis Date  . Allergy   . Anxiety   . Arthritis    hands  . Colon cancer [facility])    s/p partial colectomy 1990, Cscope neg 7-09, next 2014  . DISORDER, MITRAL VALVE    MV recontruction at [place] 2006----still needs ABX for SBE prophylaxis   . Elevated PSA    [name]  . GERD (gastroesophageal reflux disease)   . Heart murmur    no problems since surgery  . HYPERGLYCEMIA, BORDERLINE 10/16/2008  . HYPERLIPIDEMIA 06/15/2006   diet controlled  . Hypertension   . HYPERTROPHY PROSTATE W/O UR OBST & OTH LUTS 10/16/2008  . Right groin pain    Dr. Redmond Pulling  . Wears partial dentures    lower partial    Past Surgical History:  Procedure Laterality Date  . COLECTOMY  1990  . COLONOSCOPY     . HERNIA REPAIR Left 2014   [facility] rep w/mesh- March 2014  . LOOP RECORDER INSERTION N/A 12/01/2016   Procedure: LOOP RECORDER INSERTION;  Surgeon: Evans Lance, MD;  Location: [facility];  Service: Cardiovascular;  Laterality: N/A;  . MITRAL VALVE ANNULOPLASTY     [facility] 2006  . TEE WITHOUT CARDIOVERSION N/A 10/05/2016   Procedure: TRANSESOPHAGEAL ECHOCARDIOGRAM (TEE);  Surgeon: [name], MD;  Location: [facility] ENDOSCOPY;  Service: Cardiovascular;  Laterality: N/A;    Social History   Social History  . Marital status: Married    Spouse name: suzanne  . Number of children: 2  . Years of education: college   Occupational History  . retired     Social History Main Topics  . Smoking status: Former Smoker    Packs/day: 0.50    Years: 20.00    Types: Cigarettes    Quit date: 02/23/1983  . Smokeless tobacco: Never Used  . Alcohol use 4.2 oz/week    7 Glasses of wine per week  . Drug use: No  . Sexual activity: Not on file   Other Topics Concern  . Not on file   Social History Narrative   From [place], household pt and wife   Daughter in [place], son in [place] as of 12/13/2016  Reactions   Statins Other (See Comments)   Myalgias (intolerance)      Medication List       Accurate as of 12/13/16  3:24 PM. Always use your most recent med list.          ALPRAZolam 0.25 MG tablet Commonly known as:  XANAX Take 1-2 tablets (0.25-0.5 mg total) by mouth 2 (two) times daily as needed for anxiety.   Arginine 500 MG Caps Take 1,500 mg by mouth daily.   aspirin EC 81 MG tablet Take 81 mg by mouth at bedtime.   clopidogrel 75 MG tablet Commonly known as:  PLAVIX Take 1 tablet (75 mg total) by mouth daily.   ezetimibe 10 MG tablet Commonly known as:  ZETIA Take 1 tablet (10 mg total) by mouth daily.   FISH OIL PO Take 1,000 mg by mouth daily.   gabapentin 300 MG capsule Commonly known as:  NEURONTIN Take 1 capsule (300 mg total) by  mouth at bedtime.   JOINT HEALTH PO Take 1 tablet by mouth daily. JARROW JOINT BUILDER   loratadine 10 MG tablet Commonly known as:  CLARITIN Take 10 mg by mouth at bedtime.   LYCOPENE PO Take 1 capsule by mouth daily. JARROW FORMULA LYCO-SORB PROSTATE SUPPORT   metoprolol tartrate 25 MG tablet Commonly known as:  LOPRESSOR Take 1 tablet by mouth 2 (two) times daily.   OCUVITE PRESERVISION PO Take 1 tablet by mouth 2 (two) times daily.   omeprazole 40 MG capsule Commonly known as:  PRILOSEC Take 1 capsule (40 mg total) by mouth daily.   RED YEAST RICE PO Take 1 tablet by mouth 2 (two) times daily. MORNING & AFTERNOON   rosuvastatin 20 MG tablet Commonly known as:  CRESTOR Take 1 tablet (20 mg total) by mouth at bedtime.          Objective:   Physical Exam BP 116/68 (BP Location: Left Arm, Patient Position: Sitting, Cuff Size: Small)   Pulse 63   Temp 97.6 F (36.4 C) (Oral)   Resp 14   Ht 5\' 10"  (1.778 m)   Wt 183 lb 8 oz (83.2 kg)   SpO2 93%   BMI 26.33 kg/m  General:   Well developed, well nourished . NAD.  HEENT:  Normocephalic . Face symmetric, atraumatic Lungs:  CTA B Normal respiratory effort, no intercostal retractions, no accessory muscle use. Heart: RRR,  no murmur.  No pretibial edema bilaterally  Skin: Not pale. Not jaundice Neurologic:  alert & oriented X3.  Speech normal, gait appropriate for age and unassisted. EOMI, motor symmetric. Face symmetric. Psych--  Cognition and judgment appear intact.  Cooperative with normal attention span and concentration.  Behavior appropriate. No anxious or depressed appearing.      Assessment & Plan:   Assessment Prediabetes HTN Hyperlipidemia, statin intolerant (simva-pravachol: shoulder pain) Anxiety- rarely takes xanax Facial neuropathy dx 2015, no w/u, sx resurface x1 after temporary d/c of meds  CV: --Mitral valve annuloplasty 2006-- needs SBE prophylaxis -- SOB 04-2016, seen at [facility], had a  ECHO Nl fx MV, cath: no CAD --Cryptogenic stroke 09/12/2016, had slurred speech. Was recommended a loop recorder.started plavix -- new R PICA stroke, admitted 11-30-16 Elevated PSA and BPH-- Dr. [name] Colon cancer partial colectomy 1990, colonoscopy 08-2007 negative, Cscope again 09-2012 normal, 5 years   PLAN:  Stroke: Since the last visit, was admitted with a new stroke, currently on aspirin 81 mg, Plavix. After 3 months he will stop aspirin per  neuro rec.Marland Kitchen Has a loop implanted to look for atrial fibrillation. Clinically doing well. Recommend to continue being active, he seems to be driving safely. Emotional support provided as well. HTN: BP was well-controlled on the hospital without medications, when he arrived home BP was elevated, self restarted metoprolol twice a day. Ambulatory BPs are now 120, 130. Continue with the same, monitor BPs, consider restart HCTZ. Check a BMP. Hyperlipidemia: Controlled on Crestor and Zetia Preventive care: Declined a flu shot today. Likes a "whooping cough" shot, recommend to get a Tdap at the pharmacy RTC 3 months

## 2016-12-13 NOTE — Patient Instructions (Signed)
GO TO THE LAB : Get the blood work     GO TO THE FRONT DESK Schedule your next appointment for a  checkup in 3 months   Check the  blood pressure   daily Be sure your blood pressure is between 110/65 and  135/85. If it is consistently higher or lower, let me know   Okay to get a shot called Tdap to protect you against whooping cough (even if you got a Td less than 2 years ago)

## 2016-12-13 NOTE — Assessment & Plan Note (Signed)
Stroke: Since the last visit, was admitted with a new stroke, currently on aspirin 81 mg, Plavix. After 3 months he will stop aspirin per neuro rec.Marland Kitchen Has a loop implanted to look for atrial fibrillation. Clinically doing well. Recommend to continue being active, he seems to be driving safely. Emotional support provided as well. HTN: BP was well-controlled on the hospital without medications, when he arrived home BP was elevated, self restarted metoprolol twice a day. Ambulatory BPs are now 120, 130. Continue with the same, monitor BPs, consider restart HCTZ. Check a BMP. Hyperlipidemia: Controlled on Crestor and Zetia Preventive care: Declined a flu shot today. Likes a "whooping cough" shot, recommend to get a Tdap at the pharmacy RTC 3 months

## 2016-12-13 NOTE — Patient Outreach (Addendum)
[facility]) Care Management  12/13/2016  Kevin Bass 09/30/1940 [id number]   EMMI- Stroke RED ON EMMI ALERT DAY#: 9 DATE: 12/13/16 RED ALERT: Lost interest in things they used to enjoy? Yes Sad, hopeless, anxious, or empty? Yes   Outreach phone call to patient. HIPAA verified with patient. Patient confirmed, he is unable to be as physically active after having his second stroke. He was going to the gym several days a week. He is experiencing weakness from the stroke, per patient. He can't drive for a significant length of time per MD instructions. He ambulates in his neighborhood to continue to build his endurance. Patient discussed how he was at baseline after his first stroke. Patient stated, he started becoming more anxious after the second stroke. He voiced how he was eating healthy, exercising, and making modifications to prevent a second stroke. He is disappointed with being diagnosed with having another stroke. He is concerned due to not knowing the reason/cause of the second stroke. He is constantly monitoring his blood pressure at home and self-adjusting his medications. He reported, his Metoprolol was discontinued prior to discharging from the hospital. His blood pressure started to increase the next day. He restarted his Metoprolol at 25 mg in the morning and at night. He was able to report the changes to his primary MD during his follow-up appointment. Per patient, the MD agreed with his medication regimen. Patient stated, if his blood pressure was not stable to call the MD. RN CM informed patient to monitor his pulse rate along with his blood pressure. Patient agreed to monitor his vitals and document the readings. Patient reported, his last blood pressure readings were 110/65 to 135/85. Patient discussed having a loop recorder, which was implanted during his hospital stay. He has an upcoming appointment for a wound check on 12/15/16. His Cardiology visit is  scheduled for 12/21/16.   Plan: RN CM advised patient to contact RNCM for any needs or concerns. RN CM advised patient to alert MD for any changes in conditions.  RN CM will notify [facility] CM administrative assistant regarding case closure.  RN CM will send patient EMMI educational materials.   Kevin Bells, RN, BSN, MHA/MSL, [place] Telephonic Care Manager Coordinator [facility] Direct Phone: [phone] Toll Free: [phone] Fax: [phone]

## 2016-12-14 ENCOUNTER — Telehealth: Payer: Self-pay | Admitting: Internal Medicine

## 2016-12-14 ENCOUNTER — Encounter: Payer: Self-pay | Admitting: Internal Medicine

## 2016-12-14 ENCOUNTER — Encounter: Payer: Self-pay | Admitting: *Deleted

## 2016-12-14 DIAGNOSIS — I10 Essential (primary) hypertension: Secondary | ICD-10-CM

## 2016-12-14 MED ORDER — LOSARTAN POTASSIUM 25 MG PO TABS: 25.0000 mg | ORAL_TABLET | Freq: Two times a day (BID) | ORAL | 1 refills | Status: DC

## 2016-12-14 MED ORDER — HYDROCHLOROTHIAZIDE 25 MG PO TABS: 25.0000 mg | ORAL_TABLET | Freq: Every day | ORAL | 0 refills | Status: DC

## 2016-12-14 NOTE — Telephone Encounter (Signed)
See below, BP was elevated yesterday, went up to 199/99 but better after he self restart  hydrochlorothiazide. Denies stroke symptoms but he did have a mild headache. This morning he feels okay. Plan: Continue with hydrochlorothiazide 25 mg daily, patient usually take it in the afternoon. Start losartan 25 mg 1 by mouth twice a day, prescription sent. Monitor BPs Other medications the same   Call in 2 days and let me know how he is doing BMP in 2 weeks ------------ please enter a order, DX BMP ER if severe symptoms, stroke symptoms, unable to bring BP down. JP  =============== After my visit with you yesterday morning a had the Tdap shot at [facility] at 2pm i don't know if the shot caused any side effects but my blood pressure start to increase. These are the readings i took at 2pm the blood pressure was 151X 75 at 4.24 pm 156X82 at 6.18pm 173X85 at 8pm 199X99 at this point i took 1 tablet of 25mg  Hydrochlorothiazide and i called the paramedics after they came they took my vitals and the blood pressure went down to 143X75 it was at 9pm. The paramedics saw that my vitals were ok told us to monitor my blood pressure and left. This morning at.8.40 my blood pressure was at 156X95 i took a Metropolol at 8.40 and at 10.11 am the reading is 153X87 . I called DR. Hilty office to see if i could go see him earlier but i won't be back until Sep-30 and they said the nurse will call me. I was thinking perhaps i should take another 25 mg. at noon and Hytrochlorothiazide before night time in another words have metropolol morning and noon and Hidrochlo. at night.

## 2016-12-14 NOTE — Addendum Note (Signed)
Addended byDamita Dunnings D on: 12/14/2016 01:11 PM   Modules accepted: Orders

## 2016-12-14 NOTE — Telephone Encounter (Signed)
Call pt [phone]. Pt states he wants to expedite a return call. Offered [facility] triage for BP but states he already did that yesterday and there was nothing to be done. Pt verbalized understanding protocol is go to ED but pt insists it is not urgent and wants to speak to [facility].

## 2016-12-14 NOTE — Telephone Encounter (Signed)
Follow up     Does not want appt with APP, wants to see Hilty earlier and I explain that Hilty was not available and I could get him in with APP , he said no.    He said bp got really high after he got shot for whooping cough, can this be effecting it?

## 2016-12-14 NOTE — Telephone Encounter (Signed)
Returned call to patient.He stated his B/P has been up and down.Stated he took a TDAP vaccine recently and since taking B/P has been elevated.He just took B/P 153/87 pulse 66.He feels ok at present but would like to be seen.Appointment scheduled with Almyra Deforest PA 12/16/16 at 3:00 pm.Advised to bring a list of all medications and B/P readings.

## 2016-12-14 NOTE — Telephone Encounter (Signed)
New message    Pt c/o BP issue: STAT if pt c/o blurred vision, one-sided weakness or slurred speech  1. What are your last 5 BP readings? 156/95-this AM  2. Are you having any other symptoms (ex. Dizziness, headache, blurred vision, passed out)? Light headache, lightheaded  3. What is your BP issue? Pt states BP has been high the last few days.

## 2016-12-14 NOTE — Telephone Encounter (Signed)
BMP ordered

## 2016-12-15 ENCOUNTER — Ambulatory Visit (INDEPENDENT_AMBULATORY_CARE_PROVIDER_SITE_OTHER): Payer: Self-pay | Admitting: *Deleted

## 2016-12-15 DIAGNOSIS — I639 Cerebral infarction, unspecified: Secondary | ICD-10-CM

## 2016-12-15 LAB — CUP PACEART INCLINIC DEVICE CHECK
Date Time Interrogation Session: 20181024151020
MDC IDC PG IMPLANT DT: 20181010

## 2016-12-15 NOTE — Progress Notes (Signed)
Loop wound check in clinic. Steri-strips removed. Wound edges approximated without redness, swelling or drainage. Battery status: good. Patient educated about wound care and carelink monitoring. R-waves 0.28mV. No episodes. Monthly summary reports and ROV with GT PRN.

## 2016-12-16 ENCOUNTER — Ambulatory Visit: Payer: PPO | Admitting: Physician Assistant

## 2016-12-17 ENCOUNTER — Encounter: Payer: Self-pay | Admitting: Internal Medicine

## 2016-12-17 MED ORDER — LOSARTAN POTASSIUM 25 MG PO TABS: 50.0000 mg | ORAL_TABLET | Freq: Two times a day (BID) | ORAL | 2 refills | Status: DC

## 2016-12-17 MED ORDER — LOSARTAN POTASSIUM 50 MG PO TABS: 50.0000 mg | ORAL_TABLET | Freq: Two times a day (BID) | ORAL | 1 refills | Status: DC

## 2016-12-17 NOTE — Telephone Encounter (Signed)
Pt called in to check the status of his my-chart message. Pt would like a call back to be advised.   [phone] ( preferred #)

## 2016-12-17 NOTE — Addendum Note (Signed)
Addended byDamita Dunnings D on: 12/17/2016 04:53 PM   Modules accepted: Orders

## 2016-12-17 NOTE — Telephone Encounter (Signed)
Rx sent for #120 and 2RF to [facility].

## 2016-12-17 NOTE — Telephone Encounter (Signed)
Pt sent MyChart message at time of phone call less than 30 minutes ago, please inform Pt that MyChart messages are for non-urgent messages and will be checked at convenience of provider (PCP currently in clinic seeing other Pt's). I have forwarded his message to him. Thank you.

## 2016-12-17 NOTE — Telephone Encounter (Signed)
Send a new prescription: Losartan 25 mg: 2 by mouth twice a day #60, 2 refills  Received: Today  Message Contents  Colon Branch, MD  Damita Dunnings, [place]

## 2016-12-17 NOTE — Telephone Encounter (Signed)
See message, send losartan 50 mg 1 p.o. twice daily #60,1 refill  Received: Today  Message Contents  Colon Branch, MD  Damita Dunnings, [place]

## 2016-12-17 NOTE — Telephone Encounter (Signed)
Rx sent 

## 2016-12-21 ENCOUNTER — Encounter: Payer: Self-pay | Admitting: Internal Medicine

## 2016-12-21 ENCOUNTER — Ambulatory Visit (INDEPENDENT_AMBULATORY_CARE_PROVIDER_SITE_OTHER): Payer: PPO | Admitting: Internal Medicine

## 2016-12-21 ENCOUNTER — Telehealth: Payer: Self-pay | Admitting: *Deleted

## 2016-12-21 VITALS — BP 108/70 | HR 86 | Ht 70.0 in | Wt 180.4 lb

## 2016-12-21 DIAGNOSIS — Z9889 Other specified postprocedural states: Secondary | ICD-10-CM | POA: Diagnosis not present

## 2016-12-21 DIAGNOSIS — I639 Cerebral infarction, unspecified: Secondary | ICD-10-CM

## 2016-12-21 DIAGNOSIS — E785 Hyperlipidemia, unspecified: Secondary | ICD-10-CM | POA: Diagnosis not present

## 2016-12-21 DIAGNOSIS — I10 Essential (primary) hypertension: Secondary | ICD-10-CM

## 2016-12-21 MED ORDER — APIXABAN 5 MG PO TABS: 5.0000 mg | ORAL_TABLET | Freq: Two times a day (BID) | ORAL | 5 refills | Status: DC

## 2016-12-21 NOTE — Progress Notes (Signed)
OFFICE CONSULT NOTE  Chief Complaint:  Follow-up echo  Primary Care Physician: Colon Branch, MD  HPI:  Kevin Bass is a 76 y.o. male who is being seen today for the evaluation of workup of cryptogenic stroke at the request of Colon Branch, MD. Kevin Bass is a pleasant 76 year old patient who recently had an unfortunate cryptogenic stroke. His past ocular history significant for mitral valve disease status post repair at [facility]. He is followed by cardiologist there and recently was admitted to [facility] with cryptogenic stroke. As per routine care or transesophageal echocardiogram as well as a implanted loop recorder were recommended. Unfortunately there was difficulty during the transesophageal echocardiogram procedure which involved technical difficulties with equipment and it was not able to be completed. There was some concern that the probe could not be passed adequately and he underwent upper esophageal imaging which was normal. He has had prior teas without any difficulty. After that he decided against a loop recorder and wanted to get another opinion from his cardiologist at [facility]. There was a visit there which did not indicate that a transesophageal echocardiogram would be helpful. I do not see any evidence on his prior echocardiograms of a bubble study to exclude PFO. This could be a significant finding as there is evidence now that closure of a small PFO in the setting of a patient whose had a cryptogenic stroke is guideline indicated. It's also possible that he could have atrial fibrillation, especially given his history of mitral valve disease and valve repair which is a significant risk factor for arrhythmias.  12/21/2016  Kevin Bass was seen today in follow-up.  He underwent a repeat echocardiogram which did not demonstrate any LV thrombus or evidence for atrial septal defect.  Subsequently he had placement of an implanted loop recorder by Dr. Lovena Le which was seen in  follow-up and appears to be well-healed.  There is no evidence today of any significant arrhythmias such as atrial fibrillation.  He is currently on aspirin and Plavix for stroke prevention.  He is also had increasing doses of medication to control hypertension.  Currently his blood pressure is well controlled at 108/70.  He is appropriately on rosuvastatin, ezetimibe and fish oil with an LDL-C is 60 approximately 3 weeks ago.  PMHx:  Past Medical History:  Diagnosis Date  . Allergy   . Anxiety   . Arthritis    hands  . Colon cancer [facility])    s/p partial colectomy 1990, Cscope neg 7-09, next 2014  . DISORDER, MITRAL VALVE    MV recontruction at [place] 2006----still needs ABX for SBE prophylaxis   . Elevated PSA    [name]  . GERD (gastroesophageal reflux disease)   . Heart murmur    no problems since surgery  . HYPERGLYCEMIA, BORDERLINE 10/16/2008  . HYPERLIPIDEMIA 06/15/2006   diet controlled  . Hypertension   . HYPERTROPHY PROSTATE W/O UR OBST & OTH LUTS 10/16/2008  . Right groin pain    Dr. Redmond Pulling  . Wears partial dentures    lower partial    Past Surgical History:  Procedure Laterality Date  . COLECTOMY  1990  . COLONOSCOPY    . HERNIA REPAIR Left 2014   [facility] rep w/mesh- March 2014  . LOOP RECORDER INSERTION N/A 12/01/2016   Procedure: LOOP RECORDER INSERTION;  Surgeon: Evans Lance, MD;  Location: [place] CV LAB;  Service: Cardiovascular;  Laterality: N/A;  . MITRAL VALVE ANNULOPLASTY     [facility]  2006  . TEE WITHOUT CARDIOVERSION N/A 10/05/2016   Procedure: TRANSESOPHAGEAL ECHOCARDIOGRAM (TEE);  Surgeon: [name], MD;  Location: [facility] ENDOSCOPY;  Service: Cardiovascular;  Laterality: N/A;    FAMHx:  Family History  Problem Relation Age of Onset  . Colon cancer Other        M side (cousins)  . Diabetes Neg Hx   . Stroke Neg Hx   . CAD Neg Hx   . Prostate cancer Neg Hx   . Rectal cancer Neg Hx   . Esophageal cancer Neg Hx     SOCHx:   reports that he quit  smoking about 33 years ago. His smoking use included Cigarettes. He has a 10.00 pack-year smoking history. He has never used smokeless tobacco. He reports that he drinks about 4.2 oz of alcohol per week . He reports that he does not use drugs.  ALLERGIES:  Allergies  Allergen Reactions  . Statins Other (See Comments)    Myalgias (intolerance)    ROS: Pertinent items noted in HPI and remainder of comprehensive ROS otherwise negative.  HOME MEDS: Current Outpatient Prescriptions on File Prior to Visit  Medication Sig Dispense Refill  . ALPRAZolam (XANAX) 0.25 MG tablet Take 1-2 tablets (0.25-0.5 mg total) by mouth 2 (two) times daily as needed for anxiety. 40 tablet 1  . Arginine 500 MG CAPS Take 1,500 mg by mouth daily.    Marland Kitchen aspirin EC 81 MG tablet Take 81 mg by mouth at bedtime.    . clopidogrel (PLAVIX) 75 MG tablet Take 1 tablet (75 mg total) by mouth daily. (Patient taking differently: Take 75 mg by mouth daily at 2 PM. ) 30 tablet 4  . ezetimibe (ZETIA) 10 MG tablet Take 1 tablet (10 mg total) by mouth daily. (Patient taking differently: Take 10 mg by mouth at bedtime. ) 90 tablet 1  . gabapentin (NEURONTIN) 300 MG capsule Take 1 capsule (300 mg total) by mouth at bedtime. 1 capsule 0  . Glucosamine-MSM-Hyaluronic Acd (JOINT HEALTH PO) Take 1 tablet by mouth daily. Kevin Bass    . hydrochlorothiazide (HYDRODIURIL) 25 MG tablet Take 1 tablet (25 mg total) by mouth daily. 1 tablet 0  . loratadine (CLARITIN) 10 MG tablet Take 10 mg by mouth at bedtime.     Marland Kitchen losartan (COZAAR) 50 MG tablet Take 1 tablet (50 mg total) by mouth 2 (two) times daily. 60 tablet 1  . LYCOPENE PO Take 1 capsule by mouth daily. JARROW FORMULA LYCO-SORB PROSTATE SUPPORT    . Multiple Vitamins-Minerals (OCUVITE PRESERVISION PO) Take 1 tablet by mouth 2 (two) times daily.     . Omega-3 Fatty Acids (FISH OIL PO) Take 1,000 mg by mouth daily.     Marland Kitchen omeprazole (PRILOSEC) 40 MG capsule Take 1 capsule (40 mg  total) by mouth daily. (Patient taking differently: Take 40 mg by mouth daily as needed (for acid reflux). ) 90 capsule 3  . Red Yeast Rice Extract (RED YEAST RICE PO) Take 1 tablet by mouth daily. MORNING & AFTERNOON    . rosuvastatin (CRESTOR) 20 MG tablet Take 1 tablet (20 mg total) by mouth at bedtime. 30 tablet 3   No current facility-administered medications on file prior to visit.     LABS/IMAGING: No results found for this or any previous visit (from the past 48 hour(s)). No results found.  LIPID PANEL:    Component Value Date/Time   CHOL 119 11/30/2016 1059   TRIG 76 11/30/2016 1059  HDL 44 11/30/2016 1059   CHOLHDL 2.7 11/30/2016 1059   VLDL 15 11/30/2016 1059   LDLCALC 60 11/30/2016 1059   LDLDIRECT 149.1 08/30/2012 1352    WEIGHTS: Wt Readings from Last 3 Encounters:  12/21/16 180 lb 6.4 oz (81.8 kg)  12/13/16 183 lb 8 oz (83.2 kg)  12/07/16 181 lb (82.1 kg)    VITALS: BP 108/70 (BP Location: Right Arm, Patient Position: Sitting, Cuff Size: Normal)   Pulse 86   Ht 5\' 10"  (1.778 m)   Wt 180 lb 6.4 oz (81.8 kg)   BMI 25.88 kg/m   EXAM: General appearance: alert and no distress Neck: no carotid bruit, no JVD and thyroid not enlarged, symmetric, no tenderness/mass/nodules Lungs: clear to auscultation bilaterally Heart: regular rate and rhythm, S1, S2 normal, no murmur, click, rub or gallop Abdomen: soft, non-tender; bowel sounds normal; no masses,  no organomegaly Extremities: extremities normal, atraumatic, no cyanosis or edema Pulses: 2+ and symmetric Skin: Skin color, texture, turgor normal. No rashes or lesions Neurologic: Grossly normal Psych: Pleasant  EKG: Deferred  ASSESSMENT: 1. Cryptogenic stroke 2. Status post mitral valve repair 3. S/p ILR 4. Essential hypertension 5. Dyslipidemia  PLAN: 1.   Kevin Bass underwent placement of an implanted loop recorder.  We will await to see if there is evidence of atrial fibrillation at which time  he would be more appropriately anticoagulated on Eliquis versus aspirin and Plavix.  Echo shows his mitral valve repair is stable without any evidence of atrial septal defect or LV thrombus.  We will contact him if we find any evidence on his loop recorder of A. Fib. Hypertension and lipids at goal.  Follow-up with me 6 months.  [name], MD, [place] Director of the [facility]  [facility] Attending Cardiologist  Direct Dial: [phone]  Fax: [phone]  Website:  [email or web address]  Nadean Corwin Lino Wickliff 12/21/2016, 1:51 PM

## 2016-12-21 NOTE — Telephone Encounter (Signed)
Called and d/w patient about ILR findings - this appears to be a-fib. D/W Dr. Lovena Le and he agrees. Will start Eliquis 5 mg BID (Age<80, nl creatinine, weight >60Kg). Stop aspirin and plavix. Samples to be left for the patient with co-pay card at the front desk.  Dr. Lemmie Evens

## 2016-12-21 NOTE — Telephone Encounter (Signed)
Dr. Debara Pickett called and spoke with patient about anticoagulation options. Patient will start eliquis BID. He will come by off to pick up free 30 day card tomorrow.

## 2016-12-21 NOTE — Patient Instructions (Signed)
Your physician wants you to follow-up in: 6 months with Dr. Hilty. You will receive a reminder letter in the mail two months in advance. If you don't receive a letter, please call our office to schedule the follow-up appointment.    

## 2016-12-21 NOTE — Telephone Encounter (Signed)
Received "AF" alert on 12/20/16.  Episode occurred on 12/16/16 at 0537, duration 66min.  ECG was reviewed by Dr. Rayann Heman on 10/29, who was unsure if ECG indicated A-fib and deferred to Dr. Lovena Le.  Dr. Lovena Le reviewed ECG on 12/21/16 and noted that ECG indicates probable A-fib.  He recommended that patient continue ASA and Plavix for the time being due to short duration of episode and schedule f/u in November to discuss plan.  Called and spoke with patient regarding Dr. Tanna Furry recommendations.  He was asymptomatic with episode.  Attempted to discuss EP role in rhythm monitoring for cryptogenic stroke patients, but patient feels very strongly that he should also discuss this episode with Dr. Debara Pickett.  He requests a call from Dr. Debara Pickett as he was seen in the office earlier this morning.  Patient is agreeable to an appointment with Dr. Lovena Le on 01/17/17 at 2:30pm.  Advised patient that I will route this message to Dr. Debara Pickett and his nurse for further advisement.  Educated patient about Carelink monitor and explained that it may not transmit nightly due to variations in cellular signal or proximity to monitor, but it is designed to attempt to transmit any episodes for 14 consecutive nights.  Patient verbalizes understanding and denies additional questions or concerns at this time.

## 2016-12-24 ENCOUNTER — Encounter: Payer: Self-pay | Admitting: Internal Medicine

## 2016-12-24 ENCOUNTER — Ambulatory Visit (INDEPENDENT_AMBULATORY_CARE_PROVIDER_SITE_OTHER): Payer: PPO | Admitting: Internal Medicine

## 2016-12-24 VITALS — BP 128/78 | HR 84 | Temp 97.8°F | Resp 14 | Ht 70.0 in | Wt 181.5 lb

## 2016-12-24 DIAGNOSIS — I10 Essential (primary) hypertension: Secondary | ICD-10-CM

## 2016-12-24 DIAGNOSIS — Z09 Encounter for follow-up examination after completed treatment for conditions other than malignant neoplasm: Secondary | ICD-10-CM

## 2016-12-24 LAB — BASIC METABOLIC PANEL
BUN: 19 mg/dL (ref 6–23)
CHLORIDE: 99 meq/L (ref 96–112)
CO2: 35 meq/L — AB (ref 19–32)
CREATININE: 0.98 mg/dL (ref 0.40–1.50)
Calcium: 9.4 mg/dL (ref 8.4–10.5)
GFR: 78.55 mL/min (ref >60.00)
GLUCOSE: 97 mg/dL (ref 70–99)
POTASSIUM: 3.9 meq/L (ref 3.5–5.1)
Sodium: 139 mEq/L (ref 135–145)

## 2016-12-24 NOTE — Assessment & Plan Note (Signed)
HTN: Since the last visit, BP was elevated, losartan dose increased, ambulatory BPs now are in the 120/80. Check a BMP, continue losartan 50 twice a day, HCTZ. Stroke: Now on Eliquis. Aspirin and Plavix discontinued Atrial fibrillation: Abnormal rhythm noted on ILR,cardiology rx eliquis RTC 3-4 months

## 2016-12-24 NOTE — Progress Notes (Signed)
Pre visit review using our clinic review tool, if applicable. No additional management support is needed unless otherwise documented below in the visit note. 

## 2016-12-24 NOTE — Patient Instructions (Signed)
GO TO THE LAB : Get the blood work     GO TO THE FRONT DESK Schedule your next appointment for a  checkup in 3-4 months  Get your flu shot when you can  Check the  blood pressure 2 or 3 times a   Week  Be sure your blood pressure is between 110/65 and  135/85.  if it is consistently higher or lower, let me know

## 2016-12-24 NOTE — Progress Notes (Signed)
Subjective:    Patient ID: Kevin Bass, male    DOB: 1940-04-04, 76 y.o.   MRN: [id number]  DOS:  12/24/2016 Type of visit - description : f/u Interval history: Patient requested OV to see about his blood pressure. Since the last time he was here, BP was elevated, losartan dose increased. Good compliance and tolerance. Ambulatory BPs now in the 120/80 range. Also, few days ago was a started on eliquis based on ILR readings.  Review of Systems  Denies chest pain, difficulty breathing or palpitations No gross hematuria or blood in the stools  Past Medical History:  Diagnosis Date  . Allergy   . Anxiety   . Arthritis    hands  . Colon cancer [facility])    s/p partial colectomy 1990, Cscope neg 7-09, next 2014  . DISORDER, MITRAL VALVE    MV recontruction at [place] 2006----still needs ABX for SBE prophylaxis   . Elevated PSA    [name]  . GERD (gastroesophageal reflux disease)   . Heart murmur    no problems since surgery  . HYPERGLYCEMIA, BORDERLINE 10/16/2008  . HYPERLIPIDEMIA 06/15/2006   diet controlled  . Hypertension   . HYPERTROPHY PROSTATE W/O UR OBST & OTH LUTS 10/16/2008  . Right groin pain    Dr. Redmond Pulling  . Wears partial dentures    lower partial    Past Surgical History:  Procedure Laterality Date  . COLECTOMY  1990  . COLONOSCOPY    . HERNIA REPAIR Left 2014   [employer] rep w/mesh- March 2014  . LOOP RECORDER INSERTION N/A 12/01/2016   Procedure: LOOP RECORDER INSERTION;  Surgeon: Evans Lance, MD;  Location: [place] CV LAB;  Service: Cardiovascular;  Laterality: N/A;  . MITRAL VALVE ANNULOPLASTY     [facility] 2006  . TEE WITHOUT CARDIOVERSION N/A 10/05/2016   Procedure: TRANSESOPHAGEAL ECHOCARDIOGRAM (TEE);  Surgeon: [name], MD;  Location: [facility] ENDOSCOPY;  Service: Cardiovascular;  Laterality: N/A;    Social History   Social History  . Marital status: Married    Spouse name: suzanne  . Number of children: 2  . Years of education: college    Occupational History  . retired     Social History Main Topics  . Smoking status: Former Smoker    Packs/day: 0.50    Years: 20.00    Types: Cigarettes    Quit date: 02/23/1983  . Smokeless tobacco: Never Used  . Alcohol use 4.2 oz/week    7 Glasses of wine per week  . Drug use: No  . Sexual activity: Not on file   Other Topics Concern  . Not on file   Social History Narrative   From [place], household pt and wife   Daughter in [place], son in [place] as of 12/24/2016      Reactions   Statins Other (See Comments)   Myalgias (intolerance)      Medication List       Accurate as of 12/24/16  5:48 PM. Always use your most recent med list.          ALPRAZolam 0.25 MG tablet Commonly known as:  XANAX Take 1-2 tablets (0.25-0.5 mg total) by mouth 2 (two) times daily as needed for anxiety.   apixaban 5 MG Tabs tablet Commonly known as:  ELIQUIS Take 1 tablet (5 mg total) by mouth 2 (two) times daily.   Arginine 500 MG Caps Take 1,500  mg by mouth daily.   ezetimibe 10 MG tablet Commonly known as:  ZETIA Take 1 tablet (10 mg total) by mouth daily.   FISH OIL PO Take 1,000 mg by mouth daily.   gabapentin 300 MG capsule Commonly known as:  NEURONTIN Take 1 capsule (300 mg total) by mouth at bedtime.   hydrochlorothiazide 25 MG tablet Commonly known as:  HYDRODIURIL Take 1 tablet (25 mg total) by mouth daily.   JOINT HEALTH PO Take 1 tablet by mouth daily. JARROW JOINT BUILDER   loratadine 10 MG tablet Commonly known as:  CLARITIN Take 10 mg by mouth at bedtime.   losartan 50 MG tablet Commonly known as:  COZAAR Take 1 tablet (50 mg total) by mouth 2 (two) times daily.   LYCOPENE PO Take 1 capsule by mouth daily. JARROW FORMULA LYCO-SORB PROSTATE SUPPORT   OCUVITE PRESERVISION PO Take 1 tablet by mouth 2 (two) times daily.   omeprazole 40 MG capsule Commonly known as:  PRILOSEC Take 1 capsule (40 mg total) by mouth daily.   RED  YEAST RICE PO Take 1 tablet by mouth daily. MORNING & AFTERNOON   rosuvastatin 20 MG tablet Commonly known as:  CRESTOR Take 1 tablet (20 mg total) by mouth at bedtime.          Objective:   Physical Exam BP 128/78 (BP Location: Left Arm, Patient Position: Sitting, Cuff Size: Small)   Pulse 84   Temp 97.8 F (36.6 C) (Oral)   Resp 14   Ht 5\' 10"  (1.778 m)   Wt 181 lb 8 oz (82.3 kg)   SpO2 97%   BMI 26.04 kg/m  General:   Well developed, well nourished . NAD.  HEENT:  Normocephalic . Face symmetric, atraumatic Lungs:  CTA B Normal respiratory effort, no intercostal retractions, no accessory muscle use. Heart: RRR,  no murmur.  No pretibial edema bilaterally  Skin: Not pale. Not jaundice Neurologic:  alert & oriented X3.  Speech normal, gait appropriate for age and unassisted Psych--  Cognition and judgment appear intact.  Cooperative with normal attention span and concentration.  Behavior appropriate. No anxious or depressed appearing.      Assessment & Plan:   Assessment Prediabetes HTN Hyperlipidemia, statin intolerant (simva-pravachol: shoulder pain) Anxiety- rarely takes xanax Facial neuropathy dx 2015, no w/u, sx resurface x1 after temporary d/c of meds  CV: --Mitral valve annuloplasty 2006-- needs SBE prophylaxis -- SOB 04-2016, seen at [facility], had a ECHO Nl fx MV, cath: no CAD --Cryptogenic stroke 09/12/2016, had slurred speech. Was recommended a loop recorder.started plavix -- new R PICA stroke, admitted 11-30-16, implanted loop recorder placed --A-FIB noted on ILR ~ 12-21-2016, changed to eliquis  Elevated PSA and BPH-- Dr. [name] Colon cancer partial colectomy 1990, colonoscopy 08-2007 negative, Cscope again 09-2012 normal, 5 years   PLAN:  HTN: Since the last visit, BP was elevated, losartan dose increased, ambulatory BPs now are in the 120/80. Check a BMP, continue losartan 50 twice a day, HCTZ. Stroke: Now on Eliquis. Aspirin and Plavix  discontinued Atrial fibrillation: Abnormal rhythm noted on ILR,cardiology rx eliquis RTC 3-4 months

## 2016-12-29 ENCOUNTER — Other Ambulatory Visit: Payer: Self-pay | Admitting: Internal Medicine

## 2016-12-31 ENCOUNTER — Other Ambulatory Visit: Payer: Self-pay | Admitting: Internal Medicine

## 2016-12-31 ENCOUNTER — Ambulatory Visit (INDEPENDENT_AMBULATORY_CARE_PROVIDER_SITE_OTHER): Payer: PPO | Admitting: *Deleted

## 2016-12-31 DIAGNOSIS — I639 Cerebral infarction, unspecified: Secondary | ICD-10-CM | POA: Diagnosis not present

## 2017-01-01 ENCOUNTER — Encounter: Payer: Self-pay | Admitting: Internal Medicine

## 2017-01-03 ENCOUNTER — Encounter: Payer: Self-pay | Admitting: Internal Medicine

## 2017-01-03 NOTE — Progress Notes (Signed)
Carelink Summary Report / Loop Recorder 

## 2017-01-05 DIAGNOSIS — N402 Nodular prostate without lower urinary tract symptoms: Secondary | ICD-10-CM | POA: Diagnosis not present

## 2017-01-06 LAB — CUP PACEART REMOTE DEVICE CHECK
Date Time Interrogation Session: 20181109203838
MDC IDC PG IMPLANT DT: 20181010

## 2017-01-17 ENCOUNTER — Encounter: Payer: Self-pay | Admitting: Internal Medicine

## 2017-01-17 ENCOUNTER — Ambulatory Visit: Payer: PPO | Admitting: Internal Medicine

## 2017-01-17 VITALS — BP 100/74 | HR 76 | Ht 70.0 in | Wt 181.6 lb

## 2017-01-17 DIAGNOSIS — I639 Cerebral infarction, unspecified: Secondary | ICD-10-CM

## 2017-01-17 DIAGNOSIS — I480 Paroxysmal atrial fibrillation: Secondary | ICD-10-CM

## 2017-01-17 NOTE — Progress Notes (Signed)
HPI [name] returns today for ongoing evaluation and management of his atrial fibrillation and prior stroke. The patient underwent insertion of an implantable loop recorder and has been found to have episodes of atrial fibrillation. He was placed on systemic anticoagulation with Eliquis. He has minimal palpitations. His blood pressure today is a little on the low side. Allergies  Allergen Reactions  . Statins Other (See Comments)    Myalgias (intolerance)     Current Outpatient Medications  Medication Sig Dispense Refill  . ALPRAZolam (XANAX) 0.25 MG tablet Take 1-2 tablets (0.25-0.5 mg total) by mouth 2 (two) times daily as needed for anxiety. 40 tablet 1  . apixaban (ELIQUIS) 5 MG TABS tablet Take 1 tablet (5 mg total) by mouth 2 (two) times daily. 60 tablet 5  . Arginine 500 MG CAPS Take 1,500 mg by mouth daily.    Marland Kitchen BOOSTRIX 5-2.5-18.5 LF-MCG/0.5 injection Inject as directed once.  0  . ezetimibe (ZETIA) 10 MG tablet Take 1 tablet (10 mg total) by mouth daily. (Patient taking differently: Take 10 mg by mouth at bedtime. ) 90 tablet 1  . FLUAD 0.5 ML SUSY Inject as directed once.  0  . gabapentin (NEURONTIN) 300 MG capsule Take 1 capsule (300 mg total) by mouth at bedtime. 1 capsule 0  . Glucosamine-MSM-Hyaluronic Acd (JOINT HEALTH PO) Take 1 tablet by mouth daily. [place]    . hydrochlorothiazide (HYDRODIURIL) 25 MG tablet Take 1 tablet (25 mg total) by mouth daily. 1 tablet 0  . loratadine (CLARITIN) 10 MG tablet Take 10 mg by mouth at bedtime.     Marland Kitchen losartan (COZAAR) 50 MG tablet Take 1 tablet (50 mg total) by mouth 2 (two) times daily. (Patient taking differently: Take 25 mg by mouth 2 (two) times daily. ) 60 tablet 1  . LYCOPENE PO Take 1 capsule by mouth daily. JARROW FORMULA LYCO-SORB PROSTATE SUPPORT    . Multiple Vitamins-Minerals (OCUVITE PRESERVISION PO) Take 1 tablet by mouth 2 (two) times daily.     . Omega-3 Fatty Acids (FISH OIL PO) Take 1,000 mg  by mouth daily.     Marland Kitchen omeprazole (PRILOSEC) 40 MG capsule Take 1 capsule (40 mg total) by mouth daily. (Patient taking differently: Take 40 mg by mouth daily as needed (for acid reflux). ) 90 capsule 3  . Red Yeast Rice Extract (RED YEAST RICE PO) Take 1 tablet by mouth daily. MORNING & AFTERNOON    . rosuvastatin (CRESTOR) 20 MG tablet Take 1 tablet (20 mg total) at bedtime by mouth. 30 tablet 5   No current facility-administered medications for this visit.      Past Medical History:  Diagnosis Date  . Allergy   . Anxiety   . Arthritis    hands  . Colon cancer [facility])    s/p partial colectomy 1990, Cscope neg 7-09, next 2014  . DISORDER, MITRAL VALVE    MV recontruction at [place] 2006----still needs ABX for SBE prophylaxis   . Elevated PSA    [name]  . GERD (gastroesophageal reflux disease)   . Heart murmur    no problems since surgery  . HYPERGLYCEMIA, BORDERLINE 10/16/2008  . HYPERLIPIDEMIA 06/15/2006   diet controlled  . Hypertension   . HYPERTROPHY PROSTATE W/O UR OBST & OTH LUTS 10/16/2008  . Right groin pain    Dr. Redmond Pulling  . Wears partial dentures    lower partial    ROS:   All systems reviewed  and negative except as noted in the HPI.   Past Surgical History:  Procedure Laterality Date  . COLECTOMY  1990  . COLONOSCOPY    . HERNIA REPAIR Left 2014   [facility] rep w/mesh- March 2014  . LOOP RECORDER INSERTION N/A 12/01/2016   Procedure: LOOP RECORDER INSERTION;  Surgeon: Evans Lance, MD;  Location: [place] [facility];  Service: Cardiovascular;  Laterality: N/A;  . MITRAL VALVE ANNULOPLASTY     [facility] 2006  . TEE WITHOUT CARDIOVERSION N/A 10/05/2016   Procedure: TRANSESOPHAGEAL ECHOCARDIOGRAM (TEE);  Surgeon: [name], MD;  Location: [facility] ENDOSCOPY;  Service: Cardiovascular;  Laterality: N/A;     Family History  Problem Relation Age of Onset  . Colon cancer Other        M side (cousins)  . Diabetes Neg Hx   . Stroke Neg Hx   . CAD Neg Hx   . Prostate  cancer Neg Hx   . Rectal cancer Neg Hx   . Esophageal cancer Neg Hx      Social History   Socioeconomic History  . Marital status: Married    Spouse name: suzanne  . Number of children: 2  . Years of education: college  . Highest education level: Not on file  Social Needs  . Financial resource strain: Not on file  . Food insecurity - worry: Not on file  . Food insecurity - inability: Not on file  . Transportation needs - medical: Not on file  . Transportation needs - non-medical: Not on file  Occupational History  . Occupation: retired   Tobacco Use  . Smoking status: Former Smoker    Packs/day: 0.50    Years: 20.00    Pack years: 10.00    Types: Cigarettes    Last attempt to quit: 02/23/1983    Years since quitting: 33.9  . Smokeless tobacco: Never Used  Substance and Sexual Activity  . Alcohol use: Yes    Alcohol/week: 4.2 oz    Types: 7 Glasses of wine per week  . Drug use: No  . Sexual activity: Not on file  Other Topics Concern  . Not on file  Social History Narrative   From [place], household pt and wife   Daughter in [place], son in [place]        BP 100/74   Pulse 76   Ht 5\' 10"  (1.778 m)   Wt 181 lb 9.6 oz (82.4 kg)   SpO2 98%   BMI 26.06 kg/m   Physical Exam:  Well appearing NAD HEENT: Unremarkable Neck:  No JVD, no thyromegally Lymphatics:  No adenopathy Back:  No CVA tenderness Lungs:  Clear HEART:  Regular rate rhythm, no murmurs, no rubs, no clicks Abd:  soft, positive bowel sounds, no organomegally, no rebound, no guarding Ext:  2 plus pulses, no edema, no cyanosis, no clubbing Skin:  No rashes no nodules Neuro:  CN II through XII intact, motor grossly intact  EKG  DEVICE  Normal device function.  See PaceArt for details.   Assess/Plan: 1. PAF - the patient is minimally if at all symptomatic. He will continue oral anticoagulant therapy. 2. Stroke - the etiology appears to be secondary to paroxysmal atrial fibrillation. I would expect  lifelong anticoagulation. 3. History of syncope - he has had no recurrent episodes. He will undergo watchful waiting. We will follow his loop recorder for pauses.

## 2017-01-17 NOTE — Patient Instructions (Signed)

## 2017-01-18 LAB — CUP PACEART INCLINIC DEVICE CHECK
Date Time Interrogation Session: 20181126203456
MDC IDC PG IMPLANT DT: 20181010

## 2017-01-24 ENCOUNTER — Telehealth: Payer: Self-pay | Admitting: *Deleted

## 2017-01-24 ENCOUNTER — Encounter: Payer: Self-pay | Admitting: *Deleted

## 2017-01-24 NOTE — Telephone Encounter (Signed)
[name] very concerned about the "4 AF" episodes documented at his last office visit with Dr. Lovena Le. I advised him that they were inappropriate but he has demonstrated appropriate AF and is therefore anticoagulated. [name] will require extensively detailed documentation in the future.

## 2017-01-24 NOTE — Telephone Encounter (Signed)
Patient calling, states that he is calling in regards to a message he received on mychart.

## 2017-01-27 ENCOUNTER — Other Ambulatory Visit: Payer: Self-pay

## 2017-01-27 ENCOUNTER — Encounter: Payer: Self-pay | Admitting: Internal Medicine

## 2017-01-27 ENCOUNTER — Ambulatory Visit: Payer: PPO | Admitting: Internal Medicine

## 2017-01-27 VITALS — BP 124/68 | HR 69 | Temp 97.8°F | Resp 14 | Ht 70.0 in | Wt 181.5 lb

## 2017-01-27 DIAGNOSIS — I10 Essential (primary) hypertension: Secondary | ICD-10-CM

## 2017-01-27 MED ORDER — LOSARTAN POTASSIUM 25 MG PO TABS: 25.0000 mg | ORAL_TABLET | Freq: Two times a day (BID) | ORAL | 6 refills | Status: DC

## 2017-01-27 NOTE — Patient Instructions (Addendum)
Continue the same medicines   Consider [facility]  Check the  blood pressure 2 or 3 times a week  Be sure your blood pressure is between 110/60 and  135/85.  if it is consistently higher or lower, let me know

## 2017-01-27 NOTE — Progress Notes (Signed)
Subjective:    Patient ID: Kevin Bass, male    DOB: 07-20-1940, 76 y.o.   MRN: [id number]  DOS:  01/27/2017 Type of visit - description : acute Interval history: Since the last visit, BP was decreasing, some times less than 100. 3 weeks ago he stopped HCTZ and decrease losartan 50 mg to 1/2 BID Since then, BP is better. Most readings 120, 110, 114.  Occasionally 101 usually in the context of exercising, he feels slightly lightheaded. DBP between 65 and 70.  BP Readings from Last 3 Encounters:  01/27/17 124/68  01/17/17 100/74  12/24/16 128/78    Review of Systems No chest pain, difficulty breathing, lower extremity edema.  No palpitations No blood in the stools  Past Medical History:  Diagnosis Date  . Allergy   . Anxiety   . Arthritis    hands  . Colon cancer [facility])    s/p partial colectomy 1990, Cscope neg 7-09, next 2014  . DISORDER, MITRAL VALVE    MV recontruction at [place] 2006----still needs ABX for SBE prophylaxis   . Elevated PSA    [name]  . GERD (gastroesophageal reflux disease)   . Heart murmur    no problems since surgery  . HYPERGLYCEMIA, BORDERLINE 10/16/2008  . HYPERLIPIDEMIA 06/15/2006   diet controlled  . Hypertension   . HYPERTROPHY PROSTATE W/O UR OBST & OTH LUTS 10/16/2008  . Right groin pain    Dr. Redmond Pulling  . Wears partial dentures    lower partial    Past Surgical History:  Procedure Laterality Date  . COLECTOMY  1990  . COLONOSCOPY    . HERNIA REPAIR Left 2014   [facility] rep w/mesh- March 2014  . LOOP RECORDER INSERTION N/A 12/01/2016   Procedure: LOOP RECORDER INSERTION;  Surgeon: Evans Lance, MD;  Location: [place] CV [facility];  Service: Cardiovascular;  Laterality: N/A;  . MITRAL VALVE ANNULOPLASTY     [facility] 2006  . TEE WITHOUT CARDIOVERSION N/A 10/05/2016   Procedure: TRANSESOPHAGEAL ECHOCARDIOGRAM (TEE);  Surgeon: [name], MD;  Location: [facility] ENDOSCOPY;  Service: Cardiovascular;  Laterality: N/A;    Social History    Socioeconomic History  . Marital status: Married    Spouse name: suzanne  . Number of children: 2  . Years of education: college  . Highest education level: Not on file  Social Needs  . Financial resource strain: Not on file  . Food insecurity - worry: Not on file  . Food insecurity - inability: Not on file  . Transportation needs - medical: Not on file  . Transportation needs - non-medical: Not on file  Occupational History  . Occupation: retired   Tobacco Use  . Smoking status: Former Smoker    Packs/day: 0.50    Years: 20.00    Pack years: 10.00    Types: Cigarettes    Last attempt to quit: 02/23/1983    Years since quitting: 33.9  . Smokeless tobacco: Never Used  Substance and Sexual Activity  . Alcohol use: Yes    Alcohol/week: 4.2 oz    Types: 7 Glasses of wine per week  . Drug use: No  . Sexual activity: Not on file  Other Topics Concern  . Not on file  Social History Narrative   From [place], household pt and wife   Daughter in [place], son in [place] as of 01/27/2017      Reactions   Statins Other (See  Comments)   Myalgias (intolerance)      Medication List        Accurate as of 01/27/17  5:36 PM. Always use your most recent med list.          ALPRAZolam 0.25 MG tablet Commonly known as:  XANAX Take 1-2 tablets (0.25-0.5 mg total) by mouth 2 (two) times daily as needed for anxiety.   apixaban 5 MG Tabs tablet Commonly known as:  ELIQUIS Take 1 tablet (5 mg total) by mouth 2 (two) times daily.   Arginine 500 MG Caps Take 1,500 mg by mouth daily.   ezetimibe 10 MG tablet Commonly known as:  ZETIA Take 1 tablet (10 mg total) by mouth daily.   FISH OIL PO Take 1,000 mg by mouth daily.   gabapentin 300 MG capsule Commonly known as:  NEURONTIN Take 1 capsule (300 mg total) by mouth at bedtime.   JOINT HEALTH PO Take 1 tablet by mouth daily. JARROW JOINT BUILDER   loratadine 10 MG tablet Commonly known as:  CLARITIN Take 10 mg  by mouth at bedtime.   losartan 25 MG tablet Commonly known as:  COZAAR Take 1 tablet (25 mg total) by mouth 2 (two) times daily.   LYCOPENE PO Take 1 capsule by mouth daily. JARROW FORMULA LYCO-SORB PROSTATE SUPPORT   OCUVITE PRESERVISION PO Take 1 tablet by mouth 2 (two) times daily.   omeprazole 40 MG capsule Commonly known as:  PRILOSEC Take 1 capsule (40 mg total) by mouth daily.   RED YEAST RICE PO Take 1 tablet by mouth daily. MORNING & AFTERNOON   rosuvastatin 20 MG tablet Commonly known as:  CRESTOR Take 1 tablet (20 mg total) at bedtime by mouth.          Objective:   Physical Exam BP 124/68 (BP Location: Left Arm, Patient Position: Sitting, Cuff Size: Small)   Pulse 69   Temp 97.8 F (36.6 C) (Oral)   Resp 14   Ht 5\' 10"  (1.778 m)   Wt 181 lb 8 oz (82.3 kg)   SpO2 95%   BMI 26.04 kg/m     General:   Well developed, well nourished . NAD.  HEENT:  Normocephalic . Face symmetric, atraumatic Lungs:  CTA B Normal respiratory effort, no intercostal retractions, no accessory muscle use. Heart: seems regular,  no murmur.  No pretibial edema bilaterally  Skin: Not pale. Not jaundice Neurologic:  alert & oriented X3.  Speech normal, gait appropriate for age and unassisted Psych--  Cognition and judgment appear intact.  Cooperative with normal attention span and concentration.  Behavior appropriate. No anxious or depressed appearing.     Assessment & Plan:  Assessment Prediabetes HTN Hyperlipidemia, statin intolerant (simva-pravachol: shoulder pain) Anxiety- rarely takes xanax Facial neuropathy dx 2015, no w/u, sx resurface x1 after temporary d/c of meds  CV: --Mitral valve annuloplasty 2006-- needs SBE prophylaxis -- SOB 04-2016, seen at [facility], had a ECHO Nl fx MV, cath: no CAD --Cryptogenic stroke 09/12/2016, had slurred speech. Was recommended a loop recorder.started plavix -- new R PICA stroke, admitted 11-30-16, implanted loop recorder  placed --A-FIB noted on ILR ~ 12-21-2016, changed to eliquis  Elevated PSA and BPH-- Dr. [name] Colon cancer partial colectomy 1990, colonoscopy 08-2007 negative, Cscope again 09-2012 normal, 5 years   PLAN:  HTN: BP was getting low thus  3 weeks ago he stopped HCTZ and decrease losartan to 25 mg B.I.D. BPs now are very good, occasionally SBP 101 in the  context of exercising, he feels slightly lightheaded.  Plan: Continue reduced dose of losartan, drink plenty of fluids before and after exercising and continue monitoring BPs.  See AVS. Atrial fibrillation: Seen by cardiology 01/17/2017, no changes made. Follow-up as scheduled for 03-2017

## 2017-01-27 NOTE — Progress Notes (Signed)
Pre visit review using our clinic review tool, if applicable. No additional management support is needed unless otherwise documented below in the visit note. 

## 2017-01-27 NOTE — Assessment & Plan Note (Signed)
HTN: BP was getting low thus  3 weeks ago he stopped HCTZ and decrease losartan to 25 mg B.I.D. BPs now are very good, occasionally SBP 101 in the context of exercising, he feels slightly lightheaded.  Plan: Continue reduced dose of losartan, drink plenty of fluids before and after exercising and continue monitoring BPs.  See AVS. Atrial fibrillation: Seen by cardiology 01/17/2017, no changes made. Follow-up as scheduled for 03-2017

## 2017-01-31 ENCOUNTER — Ambulatory Visit (INDEPENDENT_AMBULATORY_CARE_PROVIDER_SITE_OTHER): Payer: PPO | Admitting: *Deleted

## 2017-01-31 DIAGNOSIS — I639 Cerebral infarction, unspecified: Secondary | ICD-10-CM | POA: Diagnosis not present

## 2017-01-31 NOTE — Progress Notes (Signed)
Carelink Summary Report / Loop Recorder 

## 2017-02-08 LAB — CUP PACEART REMOTE DEVICE CHECK
MDC IDC PG IMPLANT DT: 20181010
MDC IDC SESS DTM: 20181209064512

## 2017-02-10 DIAGNOSIS — H524 Presbyopia: Secondary | ICD-10-CM | POA: Diagnosis not present

## 2017-02-10 DIAGNOSIS — H52223 Regular astigmatism, bilateral: Secondary | ICD-10-CM | POA: Diagnosis not present

## 2017-02-10 DIAGNOSIS — H43821 Vitreomacular adhesion, right eye: Secondary | ICD-10-CM | POA: Diagnosis not present

## 2017-02-10 DIAGNOSIS — H43812 Vitreous degeneration, left eye: Secondary | ICD-10-CM | POA: Diagnosis not present

## 2017-02-10 DIAGNOSIS — H2513 Age-related nuclear cataract, bilateral: Secondary | ICD-10-CM | POA: Diagnosis not present

## 2017-02-10 DIAGNOSIS — H35362 Drusen (degenerative) of macula, left eye: Secondary | ICD-10-CM | POA: Diagnosis not present

## 2017-02-10 DIAGNOSIS — H5203 Hypermetropia, bilateral: Secondary | ICD-10-CM | POA: Diagnosis not present

## 2017-02-18 ENCOUNTER — Telehealth: Payer: Self-pay | Admitting: Internal Medicine

## 2017-02-18 NOTE — Telephone Encounter (Signed)
Most common side effects include:   Increased risk of bleeding  Allergic reaction  Change in liver function  Patient should report any new symptom developed after initiation of ANY new medication.

## 2017-02-18 NOTE — Telephone Encounter (Signed)
Patient started eliquis 12/21/16  Routed to pharmacy staff

## 2017-02-18 NOTE — Telephone Encounter (Signed)
New message    Patient calling with concerns about tingling in hands, fingers and toes. No other symptoms.   Pt c/o medication issue:  1. Name of Medication: apixaban (ELIQUIS) 5 MG TABS tablet  2. How are you currently taking this medication (dosage and times per day)? As prescribed  3. Are you having a reaction (difficulty breathing--STAT)? No  4. What is your medication issue? Patient wants to know the side effects of medication.

## 2017-02-18 NOTE — Telephone Encounter (Signed)
Patient called and made aware of info per [facility]. He voiced understanding. He has h/o of neuropathy. He may contact PCP for further evaluation

## 2017-02-21 ENCOUNTER — Other Ambulatory Visit: Payer: Self-pay

## 2017-02-21 ENCOUNTER — Emergency Department (HOSPITAL_BASED_OUTPATIENT_CLINIC_OR_DEPARTMENT_OTHER): Payer: PPO

## 2017-02-21 ENCOUNTER — Emergency Department (HOSPITAL_BASED_OUTPATIENT_CLINIC_OR_DEPARTMENT_OTHER)
Admission: EM | Admit: 2017-02-21 | Discharge: 2017-02-21 | Disposition: A | Discharge status: Home / Self Care | Payer: PPO | Attending: Physician Assistant | Admitting: Physician Assistant

## 2017-02-21 ENCOUNTER — Encounter (HOSPITAL_BASED_OUTPATIENT_CLINIC_OR_DEPARTMENT_OTHER): Payer: Self-pay | Admitting: Emergency Medicine

## 2017-02-21 DIAGNOSIS — Z85038 Personal history of other malignant neoplasm of large intestine: Secondary | ICD-10-CM | POA: Insufficient documentation

## 2017-02-21 DIAGNOSIS — I639 Cerebral infarction, unspecified: Secondary | ICD-10-CM | POA: Diagnosis not present

## 2017-02-21 DIAGNOSIS — R202 Paresthesia of skin: Secondary | ICD-10-CM | POA: Insufficient documentation

## 2017-02-21 DIAGNOSIS — Z79899 Other long term (current) drug therapy: Secondary | ICD-10-CM | POA: Insufficient documentation

## 2017-02-21 DIAGNOSIS — I10 Essential (primary) hypertension: Secondary | ICD-10-CM | POA: Insufficient documentation

## 2017-02-21 DIAGNOSIS — Z8673 Personal history of transient ischemic attack (TIA), and cerebral infarction without residual deficits: Secondary | ICD-10-CM | POA: Insufficient documentation

## 2017-02-21 DIAGNOSIS — R200 Anesthesia of skin: Secondary | ICD-10-CM

## 2017-02-21 DIAGNOSIS — Z7901 Long term (current) use of anticoagulants: Secondary | ICD-10-CM | POA: Diagnosis not present

## 2017-02-21 DIAGNOSIS — Z87891 Personal history of nicotine dependence: Secondary | ICD-10-CM | POA: Insufficient documentation

## 2017-02-21 HISTORY — DX: Cerebral infarction, unspecified: I63.9

## 2017-02-21 HISTORY — DX: Unspecified atrial fibrillation: I48.91

## 2017-02-21 HISTORY — DX: Polyneuropathy, unspecified: G62.9

## 2017-02-21 LAB — COMPREHENSIVE METABOLIC PANEL
ALBUMIN: 3.9 g/dL (ref 3.5–5.0)
ALK PHOS: 47 U/L (ref 38–126)
ALT: 18 U/L (ref 17–63)
AST: 22 U/L (ref 15–41)
Anion gap: 5 (ref 5–15)
BILIRUBIN TOTAL: 0.7 mg/dL (ref 0.3–1.2)
BUN: 15 mg/dL (ref 6–20)
CALCIUM: 9 mg/dL (ref 8.9–10.3)
CO2: 28 mmol/L (ref 22–32)
Chloride: 105 mmol/L (ref 101–111)
Creatinine, Ser: 0.89 mg/dL (ref 0.61–1.24)
GFR calc Af Amer: >60 mL/min (ref >60)
GLUCOSE: 133 mg/dL — AB (ref 65–99)
Potassium: 3.8 mmol/L (ref 3.5–5.1)
Sodium: 138 mmol/L (ref 135–145)
TOTAL PROTEIN: 6.8 g/dL (ref 6.5–8.1)

## 2017-02-21 LAB — CBC WITH DIFFERENTIAL/PLATELET
BASOS PCT: 1 %
Basophils Absolute: 0 10*3/uL (ref 0.0–0.1)
Eosinophils Absolute: 0.2 10*3/uL (ref 0.0–0.7)
Eosinophils Relative: 3 %
HEMATOCRIT: 40.5 % (ref 39.0–52.0)
HEMOGLOBIN: 13.6 g/dL (ref 13.0–17.0)
LYMPHS PCT: 25 %
Lymphs Abs: 1.5 10*3/uL (ref 0.7–4.0)
MCH: 28.5 pg (ref 26.0–34.0)
MCHC: 33.6 g/dL (ref 30.0–36.0)
MCV: 84.9 fL (ref 78.0–100.0)
MONOS PCT: 6 %
Monocytes Absolute: 0.4 10*3/uL (ref 0.1–1.0)
NEUTROS ABS: 3.9 10*3/uL (ref 1.7–7.7)
NEUTROS PCT: 65 %
Platelets: 203 10*3/uL (ref 150–400)
RBC: 4.77 MIL/uL (ref 4.22–5.81)
RDW: 13.2 % (ref 11.5–15.5)
WBC: 6 10*3/uL (ref 4.0–10.5)

## 2017-02-21 LAB — TROPONIN I

## 2017-02-21 NOTE — ED Triage Notes (Addendum)
"   In the last 3 days I started having coldness and numbness to my fingers on and off and my  right foot in the evening gets cold" Is scheduled to see PCP on 02/23/17 , hx of previous CVA x 2 in the past 6 months, affected speech and memory, speech clear, No neuro deficit noted

## 2017-02-21 NOTE — Discharge Instructions (Signed)
Please follow-up with Dr. [name] on the second as planned.  Please return with any new neurologic symptoms.

## 2017-02-21 NOTE — ED Notes (Signed)
Patient placed on cardiac monitor with vitals set to Q 30 minutes.

## 2017-02-21 NOTE — ED Provider Notes (Signed)
[place] EMERGENCY DEPARTMENT Provider Note   CSN: [id number] Arrival date & time: 02/21/17  1505     History   Chief Complaint No chief complaint on file.   HPI Torryn Hudspeth is a 75 y.o. male.  HPI   76 year old male with history of anxiety, A. fib, 2 prior strokes on Eliquis presenting today with feelings of occasional numbness and tingling in his bilateral hands and right foot.  Patient reports it started around 3-4 days ago.  He said he noted it occasionally over the last couple months but in the last 2 days is gotten worse.  He thinks the right might be less worse than the left hand.  It occasionally in the right foot.  Currently he has no paresthesias.  Patient not did not notice any weakness.  No slurred speech.  Patient started on gabapentin for trigeminal neuralgia, 300 mg at night.  Past Medical History:  Diagnosis Date  . Allergy   . Anxiety   . Arthritis    hands  . Atrial fibrillation ([place])   . Colon cancer [facility])    s/p partial colectomy 1990, Cscope neg 7-09, next 2014  . DISORDER, MITRAL VALVE    MV recontruction at [place] 2006----still needs ABX for SBE prophylaxis   . Elevated PSA    [name]  . GERD (gastroesophageal reflux disease)   . Heart murmur    no problems since surgery  . HYPERGLYCEMIA, BORDERLINE 10/16/2008  . HYPERLIPIDEMIA 06/15/2006   diet controlled  . Hypertension   . HYPERTROPHY PROSTATE W/O UR OBST & OTH LUTS 10/16/2008  . Neuropathy   . Right groin pain    Dr. Redmond Pulling  . Stroke ([place])   . Wears partial dentures    lower partial    Patient Active Problem List   Diagnosis Date Noted  . Trigeminal neuralgia of right side of face 10/01/2016  . History of mitral valve repair 09/30/2016  . Cerebrovascular accident (CVA) ([place]) 09/12/2016  . GERD (gastroesophageal reflux disease) 09/22/2015  . PCP NOTES >>>>> 11/08/2014  . Chest pain 06/07/2014  . Facial neuropathy 04/02/2013  . Anxiety 02/26/2013  . Annual  physical exam 08/30/2012  . HTN (hypertension) 01/22/2011  . HYPERTROPHY PROSTATE W/O UR OBST & OTH LUTS 10/16/2008  . Hyperglycemia 10/16/2008  . Hyperlipidemia 06/15/2006  . Mitral valve disease 06/15/2006  . ALLERGIC RHINITIS 06/15/2006  . COLON CANCER, HX OF 06/15/2006    Past Surgical History:  Procedure Laterality Date  . COLECTOMY  1990  . COLONOSCOPY    . HERNIA REPAIR Left 2014   [facility] rep w/mesh- March 2014  . LOOP RECORDER INSERTION N/A 12/01/2016   Procedure: LOOP RECORDER INSERTION;  Surgeon: Evans Lance, MD;  Location: [place] CV LAB;  Service: Cardiovascular;  Laterality: N/A;  . MITRAL VALVE ANNULOPLASTY     [facility] 2006  . TEE WITHOUT CARDIOVERSION N/A 10/05/2016   Procedure: TRANSESOPHAGEAL ECHOCARDIOGRAM (TEE);  Surgeon: [name], MD;  Location: [facility] ENDOSCOPY;  Service: Cardiovascular;  Laterality: N/A;       Home Medications    Prior to Admission medications   Medication Sig Start Date End Date Taking? Authorizing Provider  ALPRAZolam (XANAX) 0.25 MG tablet Take 1-2 tablets (0.25-0.5 mg total) by mouth 2 (two) times daily as needed for anxiety. 11/03/16   Colon Branch, MD  apixaban (ELIQUIS) 5 MG TABS tablet Take 1 tablet (5 mg total) by mouth 2 (two) times daily. 12/21/16   [name],  MD  Arginine 500 MG CAPS Take 1,500 mg by mouth daily.    [provider]  ezetimibe (ZETIA) 10 MG tablet Take 1 tablet (10 mg total) by mouth daily. Patient taking differently: Take 10 mg by mouth at bedtime.  09/07/16   Colon Branch, MD  gabapentin (NEURONTIN) 300 MG capsule Take 1 capsule (300 mg total) by mouth at bedtime. 12/02/16   Allie Bossier, MD  Glucosamine-MSM-Hyaluronic Acd (JOINT HEALTH PO) Take 1 tablet by mouth daily. [facility]    [provider]  loratadine (CLARITIN) 10 MG tablet Take 10 mg by mouth at bedtime.     [provider]  losartan (COZAAR) 25 MG tablet Take 1 tablet (25 mg total) by mouth 2 (two)  times daily. 01/27/17   Colon Branch, MD  LYCOPENE PO Take 1 capsule by mouth daily. [place] FORMULA LYCO-SORB PROSTATE SUPPORT    [provider]  Multiple Vitamins-Minerals (OCUVITE PRESERVISION PO) Take 1 tablet by mouth 2 (two) times daily.     [provider]  Omega-3 Fatty Acids (FISH OIL PO) Take 1,000 mg by mouth daily.     [provider]  omeprazole (PRILOSEC) 40 MG capsule Take 1 capsule (40 mg total) by mouth daily. Patient taking differently: Take 40 mg by mouth daily as needed (for acid reflux).  07/06/16   Colon Branch, MD  Red Yeast Rice Extract (RED YEAST RICE PO) Take 1 tablet by mouth daily. MORNING & AFTERNOON    [provider]  rosuvastatin (CRESTOR) 20 MG tablet Take 1 tablet (20 mg total) at bedtime by mouth. 12/30/16   Colon Branch, MD    Family History Family History  Problem Relation Age of Onset  . Colon cancer Other        M side (cousins)  . Diabetes Neg Hx   . Stroke Neg Hx   . CAD Neg Hx   . Prostate cancer Neg Hx   . Rectal cancer Neg Hx   . Esophageal cancer Neg Hx     Social History Social History   Tobacco Use  . Smoking status: Former Smoker    Packs/day: 0.50    Years: 20.00    Pack years: 10.00    Types: Cigarettes    Last attempt to quit: 02/23/1983    Years since quitting: 34.0  . Smokeless tobacco: Never Used  Substance Use Topics  . Alcohol use: Yes    Alcohol/week: 4.2 oz    Types: 7 Glasses of wine per week  . Drug use: No     Allergies   Statins   Review of Systems Review of Systems  Constitutional: Negative for activity change.  Respiratory: Negative for shortness of breath.   Cardiovascular: Negative for chest pain.  Gastrointestinal: Negative for abdominal pain.  Neurological: Positive for numbness. Negative for dizziness, facial asymmetry, speech difficulty, weakness, light-headedness and headaches.  All other systems reviewed and are negative.    Physical Exam Updated Vital  Signs BP 116/72 (BP Location: Right Arm)   Pulse 72   Temp 98.3 F (36.8 C) (Oral)   Resp 18   Ht 5\' 10"  (1.778 m)   Wt 83.2 kg (183 lb 5 oz)   SpO2 98%   BMI 26.30 kg/m   Physical Exam  Constitutional: He is oriented to person, place, and time. He appears well-nourished.  HENT:  Head: Normocephalic.  Eyes: Conjunctivae are normal.  Cardiovascular: Normal rate.  Pulmonary/Chest: Effort normal.  Neurological: He is oriented to person, place, and time.  Equal strength bilaterally upper and lower extremities negative pronator drift. Fiunger to nose normal. Normal sensation bilaterally. Speech comprehensible, no slurring. Facial nerve tested and appears grossly normal. Alert and oriented 3.   Skin: Skin is warm and dry. He is not diaphoretic.  Psychiatric: He has a normal mood and affect. His behavior is normal.     ED Treatments / Results  Labs (all labs ordered are listed, but only abnormal results are displayed) Labs Reviewed  COMPREHENSIVE METABOLIC PANEL - Abnormal; Notable for the following components:      Result Value   Glucose, Bld 133 (*)    All other components within normal limits  CBC WITH DIFFERENTIAL/PLATELET  TROPONIN I    EKG  EKG Interpretation  Date/Time:  Monday February 21 2017 15:12:08 EST Ventricular Rate:  83 PR Interval:  164 QRS Duration: 80 QT Interval:  374 QTC Calculation: 439 R Axis:   44 Text Interpretation:  Sinus rhythm with Premature atrial complexes Possible Anterior infarct , age undetermined Abnormal ECG Normal sinus rhythm Confirmed by Zenovia Jarred [phone]) on 02/21/2017 4:07:17 PM       Radiology Ct Head Wo Contrast  Result Date: 02/21/2017 CLINICAL DATA:  Coldness and numbness in the fingers intermittently. Right foot and ankle. Focal neuro deficit of greater than 6 hours. Recent infarcts. EXAM: CT HEAD WITHOUT CONTRAST TECHNIQUE: Contiguous axial images were obtained from the base of the skull through the vertex  without intravenous contrast. COMPARISON:  MRI brain 11/30/2016. FINDINGS: Brain: Previous right PICA territory infarct is again noted. No other acute infarct, hemorrhage, or mass lesion is present. The ventricles are of normal size. No significant extra-axial fluid collection is present. Vascular: Vascular calcifications are present within the cavernous internal carotid artery is bilaterally. There is no hyperdense vessel. Skull: Calvarium is intact. No focal lytic or blastic lesions are present. Sinuses/Orbits: Paranasal sinuses and mastoid air cells are clear. Globes and orbits are within normal limits. IMPRESSION: 1. No acute abnormality. 2. Remote right PICA territory infarct. Electronically Signed   By: San Morelle M.D.   On: 02/21/2017 16:59    Procedures Procedures (including critical care time)  Medications Ordered in ED Medications - No data to display   Initial Impression / Assessment and Plan / ED Course  I have reviewed the triage vital signs and the nursing notes.  Pertinent labs & imaging results that were available during my care of the patient were reviewed by me and considered in my medical decision making (see chart for details).      76 year old male with history of anxiety, A. fib, 2 prior strokes on Eliquis presenting today with feelings of occasional numbness and tingling in his bilateral hands and right foot.  Patient reports it started around 3-4 days ago.  He said he noted it occasionally over the last couple months but in the last 2 days is gotten worse.  He thinks the right might be less worse than the left hand.  It occasionally in the right foot.  Currently he has no paresthesias.  Patient not did not notice any weakness.  No slurred speech.  Patient started on gabapentin for trigeminal neuralgia, 300 mg at night.   5:49 PM Is difficult to tell the course of patient's symptoms.  It appears that currently does not have any numbness or tingling but it has  been coming and going.  We had a lengthy discussion with both patient and  daughter at bedside.  We do not have an MRI available at our institution currently.  We need to do an MRI in order to rule out a stroke.  Patient is high risk because he has had multiple strokes in the past.  However these to symptoms are very atypical.  They have been going on for over 3 days.  And given that he is already on Eliquis and that he had recent medical optimization in the last 6 months for stroke, I am not sure inpatient hospitalization would change management.  However discussed that option to transfer to [facility].  5:49 PM CT lab work was normal.  Discussed with family.  They would like to go home and follow-up with their primary on the second as planned.  I think this is a reasonable decision.  Final Clinical Impressions(s) / ED Diagnoses   Final diagnoses:  None    ED Discharge Orders    None       Macarthur Critchley, MD 02/21/17 1749

## 2017-02-21 NOTE — ED Notes (Signed)
NAD at this time. Pt is stable and going home.  

## 2017-02-23 ENCOUNTER — Encounter: Payer: Self-pay | Admitting: Internal Medicine

## 2017-02-23 ENCOUNTER — Ambulatory Visit (INDEPENDENT_AMBULATORY_CARE_PROVIDER_SITE_OTHER): Payer: PPO | Admitting: Internal Medicine

## 2017-02-23 VITALS — BP 134/80 | HR 78 | Temp 98.4°F | Resp 14 | Ht 70.0 in | Wt 182.4 lb

## 2017-02-23 DIAGNOSIS — R202 Paresthesia of skin: Secondary | ICD-10-CM

## 2017-02-23 NOTE — Patient Instructions (Signed)
We will ask neurology to see you sooner If you have severe or different symptoms please call or go to the ER

## 2017-02-23 NOTE — Progress Notes (Signed)
Pre visit review using our clinic review tool, if applicable. No additional management support is needed unless otherwise documented below in the visit note. 

## 2017-02-23 NOTE — Progress Notes (Signed)
Subjective:    Patient ID: Kevin Bass, male    DOB: [birth year], 77 y.o.   MRN: [id number]  DOS:  02/23/2017 Type of visit - description : ed f/u Interval history: Went to the ER 02/21/2017: Records reviewed. Had tingling in both hands and right foot since around 02/17/2017, no focal deficits or slurred speech.  Neuro exam was nonfocal Labs: Satisfactory CBC, CMP.  Negative troponin, CT head no acute.   Review of Systems he is here for follow-up, symptoms are about the same, the tingling is at his hands,  R>>L  Hand, and right foot, describes the feeling as "hot or cold" .  Reports that it was happening mostly at night for a few weeks but now also happening during the daytime for the last 6 or 7 days reason why he went to the ER. Denies a change in the color of the extremities (red or pale). No swelling. He is concerned about circulation but denies claudication per se He is also concerned about neuropathy but this symptoms would be somewhat atypical. Concerned about symptoms being due to Eliquis which would be atypical.  Denies fever chills No blood in the urine in the stools No headache, dizziness, diplopia, slurred speech or motor deficits. No neck pain.  Past Medical History:  Diagnosis Date  . Allergy   . Anxiety   . Arthritis    hands  . Atrial fibrillation ([place])   . Colon cancer [facility])    s/p partial colectomy 1990, Cscope neg 7-09, next 2014  . DISORDER, MITRAL VALVE    MV recontruction at [place] 2006----still needs ABX for SBE prophylaxis   . Elevated PSA    [name]  . GERD (gastroesophageal reflux disease)   . Heart murmur    no problems since surgery  . HYPERGLYCEMIA, BORDERLINE 10/16/2008  . HYPERLIPIDEMIA 06/15/2006   diet controlled  . Hypertension   . HYPERTROPHY PROSTATE W/O UR OBST & OTH LUTS 10/16/2008  . Neuropathy   . Right groin pain    Dr. Redmond Pulling  . Stroke ([place])   . Wears partial dentures    lower partial    Past Surgical History:    Procedure Laterality Date  . COLECTOMY  1990  . COLONOSCOPY    . HERNIA REPAIR Left 2014   [facility] rep w/mesh- March 2014  . LOOP RECORDER INSERTION N/A 12/01/2016   Procedure: LOOP RECORDER INSERTION;  Surgeon: Evans Lance, MD;  Location: [place] CV LAB;  Service: Cardiovascular;  Laterality: N/A;  . MITRAL VALVE ANNULOPLASTY     [facility] 2006  . TEE WITHOUT CARDIOVERSION N/A 10/05/2016   Procedure: TRANSESOPHAGEAL ECHOCARDIOGRAM (TEE);  Surgeon: [name], MD;  Location: [facility] ENDOSCOPY;  Service: Cardiovascular;  Laterality: N/A;    Social History   Socioeconomic History  . Marital status: Married    Spouse name: suzanne  . Number of children: 2  . Years of education: college  . Highest education level: Not on file  Social Needs  . Financial resource strain: Not on file  . Food insecurity - worry: Not on file  . Food insecurity - inability: Not on file  . Transportation needs - medical: Not on file  . Transportation needs - non-medical: Not on file  Occupational History  . Occupation: retired   Tobacco Use  . Smoking status: Former Smoker    Packs/day: 0.50    Years: 20.00    Pack years: 10.00    Types: Cigarettes    Last  attempt to quit: 02/23/1983    Years since quitting: 34.0  . Smokeless tobacco: Never Used  Substance and Sexual Activity  . Alcohol use: Yes    Alcohol/week: 4.2 oz    Types: 7 Glasses of wine per week  . Drug use: No  . Sexual activity: Not on file  Other Topics Concern  . Not on file  Social History Narrative   From [place], household pt and wife   Daughter in [place], son in [place] as of 02/23/2017      Reactions   Statins Other (See Comments)   Myalgias (intolerance)      Medication List        Accurate as of 02/23/17 11:59 PM. Always use your most recent med list.          ALPRAZolam 0.25 MG tablet Commonly known as:  XANAX Take 1-2 tablets (0.25-0.5 mg total) by mouth 2 (two) times daily as needed for anxiety.    apixaban 5 MG Tabs tablet Commonly known as:  ELIQUIS Take 1 tablet (5 mg total) by mouth 2 (two) times daily.   Arginine 500 MG Caps Take 1,500 mg by mouth daily.   ezetimibe 10 MG tablet Commonly known as:  ZETIA Take 1 tablet (10 mg total) by mouth daily.   FISH OIL PO Take 1,000 mg by mouth daily.   gabapentin 300 MG capsule Commonly known as:  NEURONTIN Take 1 capsule (300 mg total) by mouth at bedtime.   JOINT HEALTH PO Take 1 tablet by mouth daily. [place] JOINT BUILDER   loratadine 10 MG tablet Commonly known as:  CLARITIN Take 10 mg by mouth at bedtime.   losartan 25 MG tablet Commonly known as:  COZAAR Take 1 tablet (25 mg total) by mouth 2 (two) times daily.   LYCOPENE PO Take 1 capsule by mouth daily. [place] FORMULA LYCO-SORB PROSTATE SUPPORT   OCUVITE PRESERVISION PO Take 1 tablet by mouth 2 (two) times daily.   omeprazole 40 MG capsule Commonly known as:  PRILOSEC Take 1 capsule (40 mg total) by mouth daily.   RED YEAST RICE PO Take 1 tablet by mouth daily. MORNING & AFTERNOON   rosuvastatin 20 MG tablet Commonly known as:  CRESTOR Take 1 tablet (20 mg total) at bedtime by mouth.          Objective:   Physical Exam BP 134/80 (BP Location: Left Arm, Patient Position: Sitting, Cuff Size: Small)   Pulse 78   Temp 98.4 F (36.9 C) (Oral)   Resp 14   Ht 5\' 10"  (1.778 m)   Wt 182 lb 6 oz (82.7 kg)   SpO2 96%   BMI 26.17 kg/m  General:   Well developed, well nourished . NAD.  HEENT:  Normocephalic . Face symmetric, atraumatic Lungs:  CTA B Normal respiratory effort, no intercostal retractions, no accessory muscle use. Heart: RRR,  no murmur.  No pretibial edema bilaterally  Skin: Not pale. Not jaundice Neurologic:  alert & oriented X3.  Speech normal, gait appropriate for age and unassisted. DTRs and motor symmetric EOMI, pupils equal and reactive. Psych--  Cognition and judgment appear intact.  Cooperative with normal attention  span and concentration.  Behavior appropriate. Seems mildly to moderately anxious but no depressed appearing.      Assessment & Plan:   Assessment Prediabetes HTN Hyperlipidemia, statin intolerant (simva-pravachol: shoulder pain) Anxiety- rarely takes xanax Facial neuropathy dx 2015, no w/u, sx resurface  x1 after temporary d/c of meds  CV: --Mitral valve annuloplasty 2006-- needs SBE prophylaxis -- SOB 04-2016, seen at [facility], had a ECHO Nl fx MV, cath: no CAD --Cryptogenic stroke 09/12/2016, had slurred speech. Was recommended a loop recorder.started plavix -- new R PICA stroke, admitted 11-30-16, implanted loop recorder placed --A-FIB noted on ILR ~ 12-21-2016, changed to eliquis  Elevated PSA and BPH-- Dr. [name] Colon cancer partial colectomy 1990, colonoscopy 08-2007 negative, Cscope again 09-2012 normal, 5 years   PLAN:  "Paresthesias": As described above, etiology unclear, no classic symptoms for stroke, neuropathy or claudication.  He is compliant with Eliquis w/o  evidence of bleeding on clinical grounds.  We discussed possibly doing an MRI versus observation, after a long conversation we agreed to ask neurology to see him sooner than next month.  In the meantime,  he is to seek for help  if symptoms change or are severe.   Today, I spent more than 25    min with the patient: >50% of the time counseling regards his symptoms, he had multiple questions about them, he was concerned about a stroke, neuropathy, "poor circulation", explained that symptoms are somewhat atypical for such conditions.

## 2017-02-24 NOTE — Assessment & Plan Note (Signed)
"  Paresthesias": As described above, etiology unclear, no classic symptoms for stroke, neuropathy or claudication.  He is compliant with Eliquis w/o  evidence of bleeding on clinical grounds.  We discussed possibly doing an MRI versus observation, after a long conversation we agreed to ask neurology to see him sooner than next month.  In the meantime,  he is to seek for help  if symptoms change or are severe.

## 2017-02-25 ENCOUNTER — Ambulatory Visit (INDEPENDENT_AMBULATORY_CARE_PROVIDER_SITE_OTHER): Payer: PPO | Admitting: Neurology

## 2017-02-25 ENCOUNTER — Other Ambulatory Visit: Payer: PPO

## 2017-02-25 VITALS — BP 118/72 | HR 74 | Ht 70.0 in | Wt 182.0 lb

## 2017-02-25 DIAGNOSIS — R202 Paresthesia of skin: Secondary | ICD-10-CM

## 2017-02-25 DIAGNOSIS — Z8673 Personal history of transient ischemic attack (TIA), and cerebral infarction without residual deficits: Secondary | ICD-10-CM | POA: Diagnosis not present

## 2017-02-25 NOTE — Patient Instructions (Addendum)
1. Bloodwork for TSH, B12, ESR, SPEP/IFE, folate  Your provider has requested that you have labwork completed today. Please go to [facility] ([id number]) on the second floor of this building before leaving the office today. You do not need to check in. If you are not called within 15 minutes please check with the front desk.   2. If bloodwork is normal, we will schedule the nerve test 3. Continue on gabapentin 365m twice a day. If burning/pins and needles sensation is very bothersome, can increase to 1 cap in AM, 2 caps in PM 4. Follow-up as scheduled next month, call for any changes

## 2017-02-25 NOTE — Progress Notes (Signed)
NEUROLOGY FOLLOW UP OFFICE NOTE  Kevin Bass [id number] [birth year]  HISTORY OF PRESENT ILLNESS: I had the pleasure of seeing Kevin Bass in follow-up in the neurology clinic on 02/25/2017. He is alone in the office today. The patient was last seen 3 months ago for recurrent strokes in July 2018 and October 2018. Since his last visit, he was found to have atrial fibrillation on loop recorder and is now taking Eliquis without side effects. He presents for new symptoms that started a couple of weeks ago where his fingers feel like they are cold or hot/burning. They are mostly cold in the morning, warmer in the evening. The toes on his right foot feel the same. Symptoms are intermittent, lasting up to an hour. He describes pins and needles sensation with no numbness. He reports hyperesthesia, if he touches something metal, it is more sensitive. The sensation involves all the fingers and palm on his hands, R>L (dorsum and palmar). He notices symptoms more around an hour after waking up. His arms and legs are unaffected, no neck/back pain. Sometimes he feels his right leg is a little sensitive with the same sensation, but it is mostly the toes. When he watches TV at night, his fingers and toes are colder. He denies any headaches, dizziness, vision changes. He has been taking gabapentin for several years due to a diagnosis of trigeminal neuralgia, when he touches his lips on the right side, he has a sharp shooting pain over the right eye/forehead. This stopped when he took gabapentin, and comes back when he stops medication. He had been taking gabapentin qhs, but since symptoms started, he increased to BID dosing 7-10 days ago, he has not noticed much difference in symptoms.   HPI 10/01/2016:  This is a pleasant 77 yo RH man with a history of colon cancer, hyperlipidemia, previous mitral valve repair in his usual state of health until 09/11/16 after taking a walk, he saw his brother-in-law and when  he started noticing word-finding difficulties. His wife reported that his speech was not necessarily slurred but he was having trouble finding his words. When symptoms were unchanged the next day, he decided to go to the ER. He was still noted to have mild word-finding difficulties. There was no focal numbness/tingling/weakness. He denied any headaches, dizziness, diplopia, dysarthria/dysphagia. I personally reviewed MRI brain without contrast which showed an acute infarct in the left posterior insula and parietal operculum. MRA head and carotid dopplers did not show any significant stenosis. Echocardiogram showed EF 60-65%, mild LVH, prior MV repair without significant stenosis or regurgitation, normal LA size. He was switched from aspirin to Plavix. LDL was 124, HbA1c was 5.6. He was unable to get a TEE in the hospital, and has seen Dr. Debara Pickett and agreed to having the procedure done on 8/14 followed by loop recorder. He denies any palpitations, chest pain, shortness of breath. No neck/back pain, bowel/bladder dysfunction. No falls. He denies any side effect on Plavix. He was started on Crestor yesterday. He feels he is 95% or more back to baseline, he denies any confusion or focal symptoms. He reports a history of facial pain on the right upper lip radiating up his cheek with good response to gabapentin. He takes 300mg  qhs with no side effects.  Update 12/07/16: He had no residual deficits from prior stroke, then on 11/29/16 he had sudden onset vertigo that made him fall backward. This lasted for 15 seconds and was followed by a mild headache. He called triage and  was instructed to go seek medical attention the next day. He went to the ER and was found to have a right PICA territory stroke. He was evaluated by Neurology with no focal deficits seen. He reports he was taking aspirin and Plavix when the stroke occurred. He had an extensive workup for the prior stroke, he had a TEE in August 2018 which showed normal  left atrium, no thrombus seen. He had a 48-hour monitor which was unremarkable. In the hospital, he had a CTA of the head and neck which did not show any occlusion or stenosis. Repeat TTE showed EF of 55-60%, left atrium mildly dilated. He had a CT chest/abdomen/pelvis which did not show any evidence of malignancy. On hospital discharge, he was instructed to continue aspirin and Plavix and stop his antihypertensives. He had a loop recorder placed.   PAST MEDICAL HISTORY: Past Medical History:  Diagnosis Date  . Allergy   . Anxiety   . Arthritis    hands  . Atrial fibrillation ([place])   . Colon cancer [facility])    s/p partial colectomy 1990, Cscope neg 7-09, next 2014  . DISORDER, MITRAL VALVE    MV recontruction at [place] 2006----still needs ABX for SBE prophylaxis   . Elevated PSA    [name]  . GERD (gastroesophageal reflux disease)   . Heart murmur    no problems since surgery  . HYPERGLYCEMIA, BORDERLINE 10/16/2008  . HYPERLIPIDEMIA 06/15/2006   diet controlled  . Hypertension   . HYPERTROPHY PROSTATE W/O UR OBST & OTH LUTS 10/16/2008  . Neuropathy   . Right groin pain    Dr. Redmond Pulling  . Stroke ([place])   . Wears partial dentures    lower partial    MEDICATIONS: Current Outpatient Medications on File Prior to Visit  Medication Sig Dispense Refill  . ALPRAZolam (XANAX) 0.25 MG tablet Take 1-2 tablets (0.25-0.5 mg total) by mouth 2 (two) times daily as needed for anxiety. 40 tablet 1  . apixaban (ELIQUIS) 5 MG TABS tablet Take 1 tablet (5 mg total) by mouth 2 (two) times daily. 60 tablet 5  . Arginine 500 MG CAPS Take 1,500 mg by mouth daily.    Marland Kitchen ezetimibe (ZETIA) 10 MG tablet Take 1 tablet (10 mg total) by mouth daily. (Patient taking differently: Take 10 mg by mouth at bedtime. ) 90 tablet 1  . gabapentin (NEURONTIN) 300 MG capsule Take 1 capsule (300 mg total) by mouth at bedtime. 1 capsule 0  . Glucosamine-MSM-Hyaluronic Acd (JOINT HEALTH PO) Take 1 tablet by mouth daily. JARROW JOINT  BUILDER    . loratadine (CLARITIN) 10 MG tablet Take 10 mg by mouth at bedtime.     Marland Kitchen losartan (COZAAR) 25 MG tablet Take 1 tablet (25 mg total) by mouth 2 (two) times daily. 60 tablet 6  . LYCOPENE PO Take 1 capsule by mouth daily. JARROW FORMULA LYCO-SORB PROSTATE SUPPORT    . Multiple Vitamins-Minerals (OCUVITE PRESERVISION PO) Take 1 tablet by mouth 2 (two) times daily.     . Omega-3 Fatty Acids (FISH OIL PO) Take 1,000 mg by mouth daily.     Marland Kitchen omeprazole (PRILOSEC) 40 MG capsule Take 1 capsule (40 mg total) by mouth daily. (Patient taking differently: Take 40 mg by mouth daily as needed (for acid reflux). ) 90 capsule 3  . Red Yeast Rice Extract (RED YEAST RICE PO) Take 1 tablet by mouth daily. MORNING & AFTERNOON    . rosuvastatin (CRESTOR) 20 MG tablet Take  1 tablet (20 mg total) at bedtime by mouth. 30 tablet 5   No current facility-administered medications on file prior to visit.     ALLERGIES: Allergies  Allergen Reactions  . Statins Other (See Comments)    Myalgias (intolerance)    FAMILY HISTORY: Family History  Problem Relation Age of Onset  . Colon cancer Other        M side (cousins)  . Diabetes Neg Hx   . Stroke Neg Hx   . CAD Neg Hx   . Prostate cancer Neg Hx   . Rectal cancer Neg Hx   . Esophageal cancer Neg Hx     SOCIAL HISTORY: Social History   Socioeconomic History  . Marital status: Married    Spouse name: suzanne  . Number of children: 2  . Years of education: college  . Highest education level: Not on file  Social Needs  . Financial resource strain: Not on file  . Food insecurity - worry: Not on file  . Food insecurity - inability: Not on file  . Transportation needs - medical: Not on file  . Transportation needs - non-medical: Not on file  Occupational History  . Occupation: retired   Tobacco Use  . Smoking status: Former Smoker    Packs/day: 0.50    Years: 20.00    Pack years: 10.00    Types: Cigarettes    Last attempt to quit:  02/23/1983    Years since quitting: 34.0  . Smokeless tobacco: Never Used  Substance and Sexual Activity  . Alcohol use: Yes    Alcohol/week: 4.2 oz    Types: 7 Glasses of wine per week  . Drug use: No  . Sexual activity: Not on file  Other Topics Concern  . Not on file  Social History Narrative   From [place], household pt and wife   Daughter in [place], son in [place]: Constitutional: No fevers, chills, or sweats, no generalized fatigue, change in appetite Eyes: No visual changes, double vision, eye pain Ear, nose and throat: No hearing loss, ear pain, nasal congestion, sore throat Cardiovascular: No chest pain, palpitations Respiratory:  No shortness of breath at rest or with exertion, wheezes GastrointestinaI: No nausea, vomiting, diarrhea, abdominal pain, fecal incontinence Genitourinary:  No dysuria, urinary retention or frequency Musculoskeletal:  No neck pain, back pain Integumentary: No rash, pruritus, skin lesions Neurological: as above Psychiatric: No depression, insomnia, anxiety Endocrine: No palpitations, fatigue, diaphoresis, mood swings, change in appetite, change in weight, increased thirst Hematologic/Lymphatic:  No anemia, purpura, petechiae. Allergic/Immunologic: no itchy/runny eyes, nasal congestion, recent allergic reactions, rashes  PHYSICAL EXAM: Vitals:   02/25/17 1459  BP: 118/72  Pulse: 74  SpO2: 97%   General: No acute distress Head:  Normocephalic/atraumatic Neck: supple, no paraspinal tenderness, full range of motion Heart:  Regular rate and rhythm Lungs:  Clear to auscultation bilaterally Back: No paraspinal tenderness Skin/Extremities: No rash, no edema Neurological Exam: alert and oriented to person, place, and time. No aphasia or dysarthria. Fund of knowledge is appropriate.  Recent and remote memory are intact.  Attention and concentration are normal.    Able to name objects and repeat phrases. Cranial nerves: Pupils  equal, round, reactive to light.  Fundoscopic exam unremarkable, no papilledema. Extraocular movements intact with no nystagmus. Visual fields full. Facial sensation intact. No facial asymmetry. Tongue, uvula, palate midline.  Motor: Bulk and tone normal, muscle strength 5/5 throughout, including fine finger movements,  no pronator drift.  Sensation: decreased cold and pin on right UE, hyperesthesia with tingling on pin testing in hands, R>L. Decreased pin, cold, vibration sense to ankles bilaterally. Deep tendon reflexes +1 right UE, +2 left UE and bilateral patellar reflexes, absent ankle jerks bilaterally.Finger to nose testing intact.  Gait narrow-based and steady, able to tandem walk adequately.  Romberg negative.  IMPRESSION: This is a pleasant 77 yo RH man with a history of colon cancer, hyperlipidemia, previous mitral valve repair with recurrent strokes secondary to atrial fibrillation. He had a left MCA branch infarct last July 2018 with transient mild aphasia, then a right PICA territory infarct with transient vertigo last 11/29/16. He presents today due to paresthesias in both hands and right foot, and due to history of strokes, is understandably concerned. He is on Eliquis and statin/BP medication. His neurological exam shows decreased sensation on the right arm, as well as evidence of mild length-dependent neuropathy. We discussed that the symptoms are not suggestive of central cause (ie stroke), workup for neuropathy will be ordered with neuropathy labs and EMG/NCV of the right UE and LE. He was advised to continue gabapentin 300mg  BID, and if paresthesias become more bothersome, increase to 300-600mg . He knows to go to the ER for any sudden change in symptoms, and will follow-up as scheduled next month.   Thank you for allowing me to participate in his care.  Please do not hesitate to call for any questions or concerns.  The duration of this appointment visit was 25 minutes of face-to-face time  with the patient.  Greater than 50% of this time was spent in counseling, explanation of diagnosis, planning of further management, and coordination of care.   Ellouise Newer, M.D.   CC: Dr. [name]

## 2017-03-01 ENCOUNTER — Telehealth: Payer: Self-pay

## 2017-03-01 ENCOUNTER — Telehealth: Payer: Self-pay | Admitting: Neurology

## 2017-03-01 DIAGNOSIS — I639 Cerebral infarction, unspecified: Secondary | ICD-10-CM

## 2017-03-01 DIAGNOSIS — G500 Trigeminal neuralgia: Secondary | ICD-10-CM

## 2017-03-01 NOTE — Telephone Encounter (Signed)
-----   Message from Cameron Sprang, MD sent at 03/01/2017 12:30 PM EST ----- Pls let him know the bloodwork is normal for thyroid and B12. Does he want to proceed with nerve testing? If yes, pls schedule EMG/NCV of right UE and LE. Thanks

## 2017-03-01 NOTE — Telephone Encounter (Signed)
Patient called and had some questions regarding his test results that were on his My Chart. Please Call. Thanks

## 2017-03-01 NOTE — Telephone Encounter (Signed)
Spoke with pt relaying message below.  He would like to go forward with EMG/NCV testing.

## 2017-03-01 NOTE — Telephone Encounter (Signed)
Please see previous phone encounter.

## 2017-03-01 NOTE — Telephone Encounter (Signed)
Orders placed in Epic

## 2017-03-02 ENCOUNTER — Other Ambulatory Visit: Payer: Self-pay

## 2017-03-02 DIAGNOSIS — R202 Paresthesia of skin: Secondary | ICD-10-CM

## 2017-03-02 LAB — VITAMIN B12: VITAMIN B 12: 940 pg/mL (ref 200–1100)

## 2017-03-02 LAB — IMMUNOFIXATION ELECTROPHORESIS
IGG (IMMUNOGLOBIN G), SERUM: 1098 mg/dL (ref 694–1618)
IGM, SERUM: 23 mg/dL — AB (ref 48–271)
IMMUNOFIX ELECTR INT: NOT DETECTED
IMMUNOGLOBULIN A: 102 mg/dL (ref 81–463)

## 2017-03-02 LAB — PROTEIN ELECTROPHORESIS, SERUM
ALPHA 1: 0.3 g/dL (ref 0.2–0.3)
ALPHA 2: 0.6 g/dL (ref 0.5–0.9)
Albumin ELP: 4 g/dL (ref 3.8–4.8)
BETA 2: 0.2 g/dL (ref 0.2–0.5)
BETA GLOBULIN: 0.4 g/dL (ref 0.4–0.6)
GAMMA GLOBULIN: 1 g/dL (ref 0.8–1.7)
Total Protein: 6.5 g/dL (ref 6.1–8.1)

## 2017-03-02 LAB — FOLATE: Folate: 15.7 ng/mL

## 2017-03-02 LAB — TSH: TSH: 1.88 mIU/L (ref 0.40–4.50)

## 2017-03-02 LAB — SEDIMENTATION RATE: SED RATE: 2 mm/h (ref 0–20)

## 2017-03-03 ENCOUNTER — Encounter: Payer: Self-pay | Admitting: Neurology

## 2017-03-03 DIAGNOSIS — R202 Paresthesia of skin: Secondary | ICD-10-CM | POA: Insufficient documentation

## 2017-03-05 ENCOUNTER — Other Ambulatory Visit: Payer: Self-pay | Admitting: Internal Medicine

## 2017-03-08 ENCOUNTER — Ambulatory Visit: Payer: PPO | Admitting: Neurology

## 2017-03-08 DIAGNOSIS — R202 Paresthesia of skin: Secondary | ICD-10-CM

## 2017-03-08 DIAGNOSIS — G5621 Lesion of ulnar nerve, right upper limb: Secondary | ICD-10-CM

## 2017-03-08 DIAGNOSIS — G5601 Carpal tunnel syndrome, right upper limb: Secondary | ICD-10-CM

## 2017-03-08 NOTE — Procedures (Signed)
[facility]  [place], [place]  [place], [place] [address] Tel: [phone] Fax:  [phone] Test Date:  03/08/2017  Patient: Kevin Bass DOB: 01-15-1941 Physician: Narda Amber, DO  Sex: Male Height: 6\' 4"  Ref Phys: Ellouise Newer, MD  ID#: [id number] Temp: 35.5C Technician:    Patient Complaints: This is a 77 year old man referred for evaluation of paresthesias involving the hands and feet.  NCV & EMG Findings: Extensive electrodiagnostic testing of the right upper and lower extremity shows:  1. Right median sensory response shows mildly prolonged latency and normal amplitude. Right ulnar sensory response shows prolonged latency.  Right sural and superficial peroneal sensory responses are within normal limits. 2. Right ulnar motor response shows reduced amplitude and conduction velocity slowing across the elbow. Right median, peroneal, and tibial motor responses are within normal limits. 3. Right tibial H reflex study is within normal limits. 4. There is no evidence of active or chronic motor axon loss changes affecting any of the tested muscles in the right upper extremity. Motor unit configuration and recruitment pattern is within normal limits. 5. Needle electrode examination in the right lower extremity was limited as patient is on anticoagulation therapy. There is evidence of chronic motor axon loss changes isolated to the anterior tibialis muscle, without accompanied active denervation.  Impression: 1. Right ulnar neuropathy with slowing across the elbow, axon loss and demyelinating in type; mild to moderate in degree electrically. 2. Right median neuropathy at or distal to the wrist, consistent with the clinical diagnosis of carpal tunnel syndrome. Overall, these findings are mild in degree electrically. 3. Probable L5 radiculopathy affecting the right lower extremity, mild in degree electrically. Correlate clinically. 4. There is no evidence of a large  fiber sensorimotor polyneuropathy affecting the right lower extremity.   ___________________________ Narda Amber, DO    Nerve Conduction Studies Anti Sensory Summary Table   Site NR Peak (ms) Norm Peak (ms) P-T Amp (V) Norm P-T Amp  Right Median Anti Sensory (2nd Digit)  35.5C  Wrist    4.0 <3.8 13.9 >10  Right Sup Peroneal Anti Sensory (Ant Lat Mall)  35.5C  12 cm    2.7 <4.6 4.8 >3  Right Sural Anti Sensory (Lat Mall)  35.5C  Calf    2.3 <4.6 8.8 >3  Right Ulnar Anti Sensory (5th Digit)  35.5C  Wrist    3.6 <3.2 16.8 >5   Motor Summary Table   Site NR Onset (ms) Norm Onset (ms) O-P Amp (mV) Norm O-P Amp Site1 Site2 Delta-0 (ms) Dist (cm) Vel (m/s) Norm Vel (m/s)  Right Median Motor (Abd Poll Brev)  35.5C  Wrist    3.3 <4.0 5.7 >5 Elbow Wrist 5.5 29.0 53 >50  Elbow    8.8  5.6         Right Peroneal Motor (Ext Dig Brev)  35.5C  Ankle    3.2 <6.0 5.2 >2.5 B Fib Ankle 7.7 37.0 48 >40  B Fib    10.9  4.6  Poplt B Fib 1.4 8.0 57 >40  Poplt    12.3  4.3         Right Tibial Motor (Abd Hall Brev)  35.5C  Ankle    5.9 <6.0 6.3 >4 Knee Ankle 6.5 40.0 62 >40  Knee    12.4  4.1         Right Ulnar Motor (Abd Dig Minimi)  35.5C  Wrist    2.6 <3.1 6.8 >7 B Elbow Wrist  3.7 24.0 65 >50  B Elbow    6.3  6.1  A Elbow B Elbow 2.3 10.0 43 >50  A Elbow    8.6  5.7          H Reflex Studies   NR H-Lat (ms) Lat Norm (ms) L-R H-Lat (ms)  Right Tibial (Gastroc)  35.5C     34.42 <35    EMG   Side Muscle Ins Act Fibs Psw Fasc Number Recrt Dur Dur. Amp Amp. Poly Poly. Comment  Right 1stDorInt Nml Nml Nml Nml Nml Nml Nml Nml Nml Nml Nml Nml N/A  Right Abd Poll Brev Nml Nml Nml Nml Nml Nml Nml Nml Nml Nml Nml Nml N/A  Right PronatorTeres Nml Nml Nml Nml Nml Nml Nml Nml Nml Nml Nml Nml N/A  Right Biceps Nml Nml Nml Nml Nml Nml Nml Nml Nml Nml Nml Nml N/A  Right Triceps Nml Nml Nml Nml Nml Nml Nml Nml Nml Nml Nml Nml N/A  Right Deltoid Nml Nml Nml Nml Nml Nml Nml Nml Nml Nml Nml  Nml N/A  Right AntTibialis Nml Nml Nml Nml 1- Rapid Some 1+ Some 1+ Nml Nml N/A  Right Gastroc Nml Nml Nml Nml Nml Nml Nml Nml Nml Nml Nml Nml N/A  Right RectFemoris Nml Nml Nml Nml Nml Nml Nml Nml Nml Nml Nml Nml N/A      Waveforms:

## 2017-03-09 ENCOUNTER — Other Ambulatory Visit: Payer: Self-pay | Admitting: Internal Medicine

## 2017-03-10 ENCOUNTER — Encounter: Payer: PPO | Admitting: Neurology

## 2017-03-14 ENCOUNTER — Encounter: Payer: Self-pay | Admitting: Neurology

## 2017-03-15 ENCOUNTER — Telehealth: Payer: Self-pay

## 2017-03-15 NOTE — Telephone Encounter (Signed)
-----   Message from Cameron Sprang, MD sent at 03/14/2017  8:42 AM EST ----- Pls let him know the nerve test did show neuropathy that is caused by pressure on the nerves on his elbow and wrist. He has mild carpal tunnel syndrome (which is pressure on the nerve on his wrist). There is also a mild pinched nerve in his back. Recommendation is to start wearing a splint around his elbow and wrist to help prevent further pressure on the nerve, avoid repetitive movements. Thanks

## 2017-03-15 NOTE — Telephone Encounter (Signed)
Pt returned my call.  Relayed message below.  Pt states that he would like a call from Dr. [name] to further detail his diagnosis

## 2017-03-15 NOTE — Telephone Encounter (Signed)
Called pt. No answer.  LMOM asking for return call to relay message below.

## 2017-03-16 ENCOUNTER — Telehealth: Payer: Self-pay | Admitting: Neurology

## 2017-03-16 MED ORDER — GABAPENTIN 300 MG PO CAPS
ORAL_CAPSULE | ORAL | 3 refills | Status: DC
Start: 2017-03-16 — End: 2017-03-29

## 2017-03-16 NOTE — Telephone Encounter (Signed)
Discussed EMG results with the patient. Recommend avoiding compression at the wrist and elbow, start wearing wrist and elbow splint during daytime. He feels the higher dose of gabapentin seems to have helped with his symptoms, refills will be sent.

## 2017-03-27 ENCOUNTER — Encounter: Payer: Self-pay | Admitting: Neurology

## 2017-03-29 ENCOUNTER — Encounter: Payer: Self-pay | Admitting: Neurology

## 2017-03-29 ENCOUNTER — Ambulatory Visit (INDEPENDENT_AMBULATORY_CARE_PROVIDER_SITE_OTHER): Payer: PPO | Admitting: Neurology

## 2017-03-29 VITALS — BP 126/68 | HR 76 | Ht 70.0 in | Wt 183.0 lb

## 2017-03-29 DIAGNOSIS — M5417 Radiculopathy, lumbosacral region: Secondary | ICD-10-CM | POA: Diagnosis not present

## 2017-03-29 DIAGNOSIS — G5621 Lesion of ulnar nerve, right upper limb: Secondary | ICD-10-CM

## 2017-03-29 DIAGNOSIS — G5601 Carpal tunnel syndrome, right upper limb: Secondary | ICD-10-CM

## 2017-03-29 MED ORDER — GABAPENTIN 300 MG PO CAPS: ORAL_CAPSULE | ORAL | 5 refills | Status: DC

## 2017-03-29 NOTE — Patient Instructions (Signed)
1. Increase Gabapentin 300mg : take 1 in AM, 1 at dinner, 2 at bedtime 2. Refer to Ortho for ulnar neuropathy 3. Refer to PT for L5 radiculopathy 4. Follow-up in 4 months, call for any changes

## 2017-03-29 NOTE — Progress Notes (Signed)
NEUROLOGY FOLLOW UP OFFICE NOTE  Jahmani Staup [id number] 1940-06-19  HISTORY OF PRESENT ILLNESS: I had the pleasure of seeing Kevin Bass in follow-up in the neurology clinic on 03/29/2017. He is alone in the office today. The patient was last seen a month ago for new onset paresthesias in his hands and right foot. He requested for an earlier visit to discuss EMG results. Neuropathy labs were normal (TSH, B12, ESR, folate, SPEP/IFE). He had an EMG/NCV of the right UE and LE which showed right ulnar neuropathy with slowing across the elbow, axon loss and demyelinating in type, mild to moderate in degree; right median neuropathy at or distal to the wrist, consistent with carpal tunnel, mild in degree, and probable L5 radiculopathy, mild. No evidence of a large fiber sensorimotor polyneuropathy. He had been taking gabapentin and increased dose, but did not notice much change. His right hand is tingling daily, and alternates between warm and cold. The bottom of his right foot feels the same. He has also been taking alpha-lipoic acid BID for the past 10 days. No side effects on medications. I had previously been seeing him for recurrent strokes that occurred in July 2018 and October 2018. He is now on Eliquis for atrial fibrillation.   HPI 10/01/2016:  This is a pleasant 77 yo RH man with a history of colon cancer, hyperlipidemia, previous mitral valve repair in his usual state of health until 09/11/16 after taking a walk, he saw his brother-in-law and when he started noticing word-finding difficulties. His wife reported that his speech was not necessarily slurred but he was having trouble finding his words. When symptoms were unchanged the next day, he decided to go to the ER. He was still noted to have mild word-finding difficulties. There was no focal numbness/tingling/weakness. He denied any headaches, dizziness, diplopia, dysarthria/dysphagia. I personally reviewed MRI brain without contrast  which showed an acute infarct in the left posterior insula and parietal operculum. MRA head and carotid dopplers did not show any significant stenosis. Echocardiogram showed EF 60-65%, mild LVH, prior MV repair without significant stenosis or regurgitation, normal LA size. He was switched from aspirin to Plavix. LDL was 124, HbA1c was 5.6. He was unable to get a TEE in the hospital, and has seen Dr. Debara Pickett and agreed to having the procedure done on 8/14 followed by loop recorder. He denies any palpitations, chest pain, shortness of breath. No neck/back pain, bowel/bladder dysfunction. No falls. He denies any side effect on Plavix. He was started on Crestor yesterday. He feels he is 95% or more back to baseline, he denies any confusion or focal symptoms. He reports a history of facial pain on the right upper lip radiating up his cheek with good response to gabapentin. He takes 358m qhs with no side effects.  Update 12/07/16: He had no residual deficits from prior stroke, then on 11/29/16 he had sudden onset vertigo that made him fall backward. This lasted for 15 seconds and was followed by a mild headache. He called triage and was instructed to go seek medical attention the next day. He went to the ER and was found to have a right PICA territory stroke. He was evaluated by Neurology with no focal deficits seen. He reports he was taking aspirin and Plavix when the stroke occurred. He had an extensive workup for the prior stroke, he had a TEE in August 2018 which showed normal left atrium, no thrombus seen. He had a 48-hour monitor which was unremarkable. In the  hospital, he had a CTA of the head and neck which did not show any occlusion or stenosis. Repeat TTE showed EF of 55-60%, left atrium mildly dilated. He had a CT chest/abdomen/pelvis which did not show any evidence of malignancy. On hospital discharge, he was instructed to continue aspirin and Plavix and stop his antihypertensives. He had a loop recorder  placed.   PAST MEDICAL HISTORY: Past Medical History:  Diagnosis Date  . Allergy   . Anxiety   . Arthritis    hands  . Atrial fibrillation ([place])   . Colon cancer [facility])    s/p partial colectomy 1990, Cscope neg 7-09, next 2014  . DISORDER, MITRAL VALVE    MV recontruction at [place] 2006----still needs ABX for SBE prophylaxis   . Elevated PSA    [name]  . GERD (gastroesophageal reflux disease)   . Heart murmur    no problems since surgery  . HYPERGLYCEMIA, BORDERLINE 10/16/2008  . HYPERLIPIDEMIA 06/15/2006   diet controlled  . Hypertension   . HYPERTROPHY PROSTATE W/O UR OBST & OTH LUTS 10/16/2008  . Neuropathy   . Right groin pain    Dr. Redmond Pulling  . Stroke ([place])   . Wears partial dentures    lower partial    MEDICATIONS: Current Outpatient Medications on File Prior to Visit  Medication Sig Dispense Refill  . ALPRAZolam (XANAX) 0.25 MG tablet Take 1-2 tablets (0.25-0.5 mg total) by mouth 2 (two) times daily as needed for anxiety. 40 tablet 1  . apixaban (ELIQUIS) 5 MG TABS tablet Take 1 tablet (5 mg total) by mouth 2 (two) times daily. 60 tablet 5  . Arginine 500 MG CAPS Take 1,500 mg by mouth daily.    Marland Kitchen ezetimibe (ZETIA) 10 MG tablet Take 1 tablet (10 mg total) by mouth daily. 90 tablet 1  . gabapentin (NEURONTIN) 300 MG capsule Take 1 cap in AM, 2 caps in PM 90 capsule 3  . Glucosamine-MSM-Hyaluronic Acd (JOINT HEALTH PO) Take 1 tablet by mouth daily. JARROW JOINT BUILDER    . loratadine (CLARITIN) 10 MG tablet Take 10 mg by mouth at bedtime.     Marland Kitchen losartan (COZAAR) 25 MG tablet Take 1 tablet (25 mg total) by mouth 2 (two) times daily. 60 tablet 6  . LYCOPENE PO Take 1 capsule by mouth daily. JARROW FORMULA LYCO-SORB PROSTATE SUPPORT    . Multiple Vitamins-Minerals (OCUVITE PRESERVISION PO) Take 1 tablet by mouth 2 (two) times daily.     . Omega-3 Fatty Acids (FISH OIL PO) Take 1,000 mg by mouth daily.     Marland Kitchen omeprazole (PRILOSEC) 40 MG capsule Take 1 capsule (40 mg total)  by mouth daily. (Patient taking differently: Take 40 mg by mouth daily as needed (for acid reflux). ) 90 capsule 3  . Red Yeast Rice Extract (RED YEAST RICE PO) Take 1 tablet by mouth daily. MORNING & AFTERNOON    . rosuvastatin (CRESTOR) 20 MG tablet Take 1 tablet (20 mg total) at bedtime by mouth. 30 tablet 5   No current facility-administered medications on file prior to visit.     ALLERGIES: Allergies  Allergen Reactions  . Statins Other (See Comments)    Myalgias (intolerance)    FAMILY HISTORY: Family History  Problem Relation Age of Onset  . Colon cancer Other        M side (cousins)  . Diabetes Neg Hx   . Stroke Neg Hx   . CAD Neg Hx   . Prostate cancer  Neg Hx   . Rectal cancer Neg Hx   . Esophageal cancer Neg Hx     SOCIAL HISTORY: Social History   Socioeconomic History  . Marital status: Married    Spouse name: suzanne  . Number of children: 2  . Years of education: college  . Highest education level: Not on file  Social Needs  . Financial resource strain: Not on file  . Food insecurity - worry: Not on file  . Food insecurity - inability: Not on file  . Transportation needs - medical: Not on file  . Transportation needs - non-medical: Not on file  Occupational History  . Occupation: retired   Tobacco Use  . Smoking status: Former Smoker    Packs/day: 0.50    Years: 20.00    Pack years: 10.00    Types: Cigarettes    Last attempt to quit: 02/23/1983    Years since quitting: 34.1  . Smokeless tobacco: Never Used  Substance and Sexual Activity  . Alcohol use: Yes    Alcohol/week: 4.2 oz    Types: 7 Glasses of wine per week  . Drug use: No  . Sexual activity: Not on file  Other Topics Concern  . Not on file  Social History Narrative   From [place], household pt and wife   Daughter in [place], son in [place]: Constitutional: No fevers, chills, or sweats, no generalized fatigue, change in appetite Eyes: No visual changes, double  vision, eye pain Ear, nose and throat: No hearing loss, ear pain, nasal congestion, sore throat Cardiovascular: No chest pain, palpitations Respiratory:  No shortness of breath at rest or with exertion, wheezes GastrointestinaI: No nausea, vomiting, diarrhea, abdominal pain, fecal incontinence Genitourinary:  No dysuria, urinary retention or frequency Musculoskeletal:  No neck pain, back pain Integumentary: No rash, pruritus, skin lesions Neurological: as above Psychiatric: No depression, insomnia, anxiety Endocrine: No palpitations, fatigue, diaphoresis, mood swings, change in appetite, change in weight, increased thirst Hematologic/Lymphatic:  No anemia, purpura, petechiae. Allergic/Immunologic: no itchy/runny eyes, nasal congestion, recent allergic reactions, rashes  PHYSICAL EXAM: Vitals:   03/29/17 1527  BP: 126/68  Pulse: 76  SpO2: 97%   General: No acute distress, slightly anxious Head:  Normocephalic/atraumatic Neck: supple, no paraspinal tenderness, full range of motion Heart:  Regular rate and rhythm Lungs:  Clear to auscultation bilaterally Back: No paraspinal tenderness Skin/Extremities: No rash, no edema Neurological Exam: alert and oriented to person, place, and time. No aphasia or dysarthria. Fund of knowledge is appropriate.  Recent and remote memory are intact.  Attention and concentration are normal.    Able to name objects and repeat phrases. Cranial nerves: Pupils equal, round, reactive to light.  Fundoscopic exam unremarkable, no papilledema. Extraocular movements intact with no nystagmus. Visual fields full. Facial sensation intact. No facial asymmetry. Tongue, uvula, palate midline.  Motor: Bulk and tone normal, muscle strength 5/5 throughout, including fine finger movements, no pronator drift.  Sensation: decreased cold and pin on right UE, hyperesthesia with tingling on pin testing in hands, R>L. Decreased pin, cold, vibration sense to ankles bilaterally (similar  to prior). Deep tendon reflexes +1 right UE, +2 left UE and bilateral patellar reflexes, absent ankle jerks bilaterally.Finger to nose testing intact.  Gait narrow-based and steady, able to tandem walk adequately.  Romberg negative. Negative Tinel sign at elbow and wrist  IMPRESSION: This is a pleasant 77 yo RH man with a history of colon cancer, hyperlipidemia, previous  mitral valve repair with recurrent strokes secondary to atrial fibrillation. He had a left MCA branch infarct last July 2018 with transient mild aphasia, then a right PICA territory infarct with transient vertigo last 11/29/16. He presented for an earlier visit due to paresthesias in both hands and right foot. EMG/NCV showed right ulnar neuropathy at the elbow and right median neuropathy at or distal to the wrist, as well as a right L5 radiculopathy, which are likely the cause of his symptoms. He is quite anxious about the diagnosis of neuropathy, I discussed with him that these are compressive neuropathies, and majority of the time avoiding compression of the nerve is helpful. He would like to see the hand surgeon about other options. He will be referred to PT for L5 radiculopathy. Increase gabapentin to 300-300-623m for symptomatic treatment. He will follow-up in 4 months and knows to call for any changes.   Thank you for allowing me to participate in his care.  Please do not hesitate to call for any questions or concerns.  The duration of this appointment visit was 25 minutes of face-to-face time with the patient.  Greater than 50% of this time was spent in counseling, explanation of diagnosis, planning of further management, and coordination of care.   KEllouise Newer M.D.   CC: Dr. PLarose Kells

## 2017-04-04 ENCOUNTER — Encounter: Payer: Self-pay | Admitting: Neurology

## 2017-04-05 ENCOUNTER — Telehealth: Payer: Self-pay | Admitting: Internal Medicine

## 2017-04-05 NOTE — Telephone Encounter (Signed)
Faxed to requesting provider via EPIC

## 2017-04-05 NOTE — Telephone Encounter (Signed)
   Primary Cardiologist: No primary care provider on file.  Chart reviewed as part of pre-operative protocol coverage. Given past medical history and time since last visit, based on ACC/AHA guidelines, Kevin Bass would be at acceptable risk for the planned procedure without further cardiovascular testing.   We typically do not hold Eliquis for cleanings. Patient does not need antibiotic prophylaxis with loop recorder.   I will route this recommendation to the requesting party via Epic fax function and remove from pre-op pool.  Please call with questions.  Jory Sims, NP 04/05/2017, 3:55 PM

## 2017-04-05 NOTE — Telephone Encounter (Signed)
1. What dental office are you calling from? Dr. Haywood Pao DDS  2. What is your office phone and fax number? [phone]  Fax [phone]  3. What type of procedure is the patient having performed? SPD Cleaning  Code [id number]  9. What date is procedure scheduled or is the patient there now? 06-23-2017   5. What is your question (ex. Antibiotics prior to procedure, holding medication-we need to know how long dentist wants pt to hold med)? Unknown

## 2017-04-06 ENCOUNTER — Encounter: Payer: Self-pay | Admitting: Neurology

## 2017-04-08 ENCOUNTER — Other Ambulatory Visit: Payer: Self-pay

## 2017-04-08 ENCOUNTER — Ambulatory Visit: Payer: PPO | Attending: Neurology

## 2017-04-08 DIAGNOSIS — R2681 Unsteadiness on feet: Secondary | ICD-10-CM | POA: Insufficient documentation

## 2017-04-08 DIAGNOSIS — M5416 Radiculopathy, lumbar region: Secondary | ICD-10-CM | POA: Diagnosis not present

## 2017-04-08 DIAGNOSIS — M6281 Muscle weakness (generalized): Secondary | ICD-10-CM | POA: Insufficient documentation

## 2017-04-08 DIAGNOSIS — R2689 Other abnormalities of gait and mobility: Secondary | ICD-10-CM

## 2017-04-08 NOTE — Therapy (Signed)
[facility] [address] [place], [place], [address] Phone: [phone]   Fax:  [phone]  Physical Therapy Evaluation  Patient Details  Name: Kevin Bass MRN: [id number] Date of Birth: [birth year] Referring Provider: Ellouise Newer MD   Encounter Date: 04/08/2017  PT End of Session - 04/08/17 1328    Visit Number  1    Number of Visits  8    Date for PT Re-Evaluation  05/06/17    PT Start Time  0930    PT Stop Time  1015    PT Time Calculation (min)  45 min    Activity Tolerance  Patient tolerated treatment well    Behavior During Therapy  [facility] for tasks assessed/performed       Past Medical History:  Diagnosis Date  . Allergy   . Anxiety   . Arthritis    hands  . Atrial fibrillation ([place])   . Colon cancer [facility])    s/p partial colectomy 1990, Cscope neg 7-09, next 2014  . DISORDER, MITRAL VALVE    MV recontruction at [place] 2006----still needs ABX for SBE prophylaxis   . Elevated PSA    [name]  . GERD (gastroesophageal reflux disease)   . Heart murmur    no problems since surgery  . HYPERGLYCEMIA, BORDERLINE 10/16/2008  . HYPERLIPIDEMIA 06/15/2006   diet controlled  . Hypertension   . HYPERTROPHY PROSTATE W/O UR OBST & OTH LUTS 10/16/2008  . Neuropathy   . Right groin pain    Dr. Redmond Pulling  . Stroke ([place])   . Wears partial dentures    lower partial    Past Surgical History:  Procedure Laterality Date  . COLECTOMY  1990  . COLONOSCOPY    . HERNIA REPAIR Left 2014   [facility] rep w/mesh- March 2014  . LOOP RECORDER INSERTION N/A 12/01/2016   Procedure: LOOP RECORDER INSERTION;  Surgeon: Evans Lance, MD;  Location: [place] CV LAB;  Service: Cardiovascular;  Laterality: N/A;  . MITRAL VALVE ANNULOPLASTY     [facility] 2006  . TEE WITHOUT CARDIOVERSION N/A 10/05/2016   Procedure: TRANSESOPHAGEAL ECHOCARDIOGRAM (TEE);  Surgeon: [name], MD;  Location: [facility] ENDOSCOPY;  Service: Cardiovascular;   Laterality: N/A;    There were no vitals filed for this visit.  Subjective Assessment - 04/08/17 0939    Subjective  Pt is a 77 y/o M, who likes to be referred to as Kevin Bass. Pt has been referred to OPPT by Ellouise Newer for R Lumbar radiculopathy. Pt reports that Dr. [name] had dx him with neuropathy (per pt) that has led to his R hand feeling cold and the occasional pain in his R foot and leg. Pt reports that his neurological symptoms are more bothersome in his R hand than low back or RLE symptoms. Pt reported that symptoms has started back in early November 2018. Pt reports his history of 2 strokes in 2018 (July and October). Pt reports that at that time the strokes were not significant enough to warrant PT and he was able to recover his function on his own. Pt is not sure why he is here for Physical therapy evaluation was not sure what PT could do for him. Pt reports minimal if any impacts on his functional ability to complete his ADLs. Although, pt reported he would like to get back to fitness training in the gym safely.  Pt has an appt. on 04/28/17 with orthopedic MD regarding carpal tunnel syndrome and Lx radiculopathy  and would like to hold off on OT referral until after that appt.    Pertinent History  Hx of 2 CVAs in 2018, HTN, mitral valve disease, facial neuropathy and trigeminal neuralgia R side of face, HLD, hx of colon CA, hyperglycemia    Limitations  Other (comment) Fitness training ( gym)    Patient Stated Goals  return to safe gym activites to continue to maintain fitness levels    Currently in Pain?  Yes    Pain Score  4     Pain Location  Hand    Pain Orientation  Right    Pain Descriptors / Indicators  Other (Comment)    Pain Type  Neuropathic pain    Pain Onset  More than a month ago    Pain Frequency  Constant    Aggravating Factors   unknown     Pain Relieving Factors  medication     Effect of Pain on Daily Activities  no          [facility] PT Assessment - 04/08/17 0944       Assessment   Medical Diagnosis  R lumbar radiculopathy    Referring Provider  Aquino,Karen MD    Onset Date/Surgical Date  03/29/17 date of referral     Hand Dominance  Right    Prior Therapy  speech      Precautions   Precautions  None    Required Braces or Orthoses  Other Brace/Splint elbow and wrist-- pt reports pain is increase when wearing      Restrictions   Weight Bearing Restrictions  No      Balance Screen   Has the patient fallen in the past 6 months  No      [place] other    Available Help at Discharge  Family spouse has MF and shigles    Type of [place] to enter    Entrance Stairs-Number of Steps  1    Entrance Stairs-Rails  None    Home Layout  Two level    Alternate Level Stairs-Number of Steps  13    Alternate Level Stairs-Rails  Left    Home Equipment  [place] - single point wife cane      Prior Function   Level of Independence  Independent    Vocation  Retired    Leisure  exercise walking, general fittness,       Cognition   Overall Cognitive Status  Within Functional Limits for tasks assessed      ROM / Strength   AROM / PROM / Strength  Strength      Strength   Overall Strength  Deficits    Strength Assessment Site  Hip;Knee;Ankle    Right/Left Hip  Right;Left    Right Hip Flexion  4/5    Right Hip Extension  4-/5    Right Hip ABduction  4-/5    Left Hip Flexion  4+/5    Left Hip Extension  4/5    Left Hip ABduction  4/5    Right/Left Knee  Left;Right    Right Knee Flexion  3+/5    Right Knee Extension  5/5    Left Knee Flexion  4-/5    Left Knee Extension  5/5    Right/Left Ankle  Left;Right    Right Ankle Dorsiflexion  4-/5    Left Ankle Dorsiflexion  5/5  Transfers   Transfers  Sit to Stand;Stand to Sit    Sit to Stand  6: Modified independent (Device/Increase time);From chair/3-in-1;Without upper extremity assist    Stand to Sit  6: Modified  independent (Device/Increase time);Without upper extremity assist      Ambulation/Gait   Ambulation/Gait  Yes    Ambulation/Gait Assistance  6: Modified independent (Device/Increase time)    Ambulation Distance (Feet)  200 Feet    Assistive device  None    Gait Pattern  Decreased arm swing - right;Decreased dorsiflexion - right;Lateral trunk lean to left;Decreased trunk rotation    Ambulation Surface  Level;Indoor    Gait velocity  3.77 ft/secound    Stairs  Yes    Stairs Assistance  6: Modified independent (Device/Increase time) extra time to initiate    Stair Management Technique  No rails    Number of Stairs  4    Height of Stairs  6      Functional Gait  Assessment   Gait assessed   Yes    Gait Level Surface  Walks 20 ft in less than 5.5 sec, no assistive devices, good speed, no evidence for imbalance, normal gait pattern, deviates no more than 6 in outside of the 12 in walkway width.    Change in Gait Speed  Able to smoothly change walking speed without loss of balance or gait deviation. Deviate no more than 6 in outside of the 12 in walkway width.    Gait with Horizontal Head Turns  Performs head turns smoothly with no change in gait. Deviates no more than 6 in outside 12 in walkway width    Gait with Vertical Head Turns  Performs head turns with no change in gait. Deviates no more than 6 in outside 12 in walkway width.    Gait and Pivot Turn  Pivot turns safely within 3 sec and stops quickly with no loss of balance.    Step Over Obstacle  Is able to step over 2 stacked shoe boxes taped together (9 in total height) without changing gait speed. No evidence of imbalance.    Gait with Narrow Base of Support  Ambulates less than 4 steps heel to toe or cannot perform without assistance.    Gait with Eyes Closed  Cannot walk 20 ft without assistance, severe gait deviations or imbalance, deviates greater than 15 in outside 12 in walkway width or will not attempt task.    Ambulating Backwards   Walks 20 ft, uses assistive device, slower speed, mild gait deviations, deviates 6-10 in outside 12 in walkway width.    Steps  Alternating feet, no rail.    Total Score  23    FGA comment:  Medium falls risk      Sensation: pt reports N/T in R hand and R foot.  Trunk AROM in standing:  -Flexion: finger tips to distal 1/3 femur, pt reports reproduction mild LBP and states LBP incr. During flexion activities such as  gardening.  -Extension: WFL no pain  -Lateral flexion    -Right: Finger tips to knee no pain    -Left- finger tips to knee  No pain      Objective measurements completed on examination: See above findings.                PT Short Term Goals - 04/08/17 1333      PT SHORT TERM GOAL #1   Title  Same as LTGs  PT Long Term Goals - 04/08/17 1333      PT LONG TERM GOAL #1   Title  Pt will be independent with initial HEP to improve functional mobility and show home progression(ALL GOALS TARGET DATE 05/06/2017)    Time  4    Period  Weeks    Status  New    Target Date  05/06/17      PT LONG TERM GOAL #2   Title  Pt will perform FGA with a score of 29/30 in order to reduce falls risk     Time  4    Period  Weeks    Status  New    Target Date  05/06/17      PT LONG TERM GOAL #3   Title  Pt will report </=1/10 LBP/RLE pain during gardening activities to improve function during ADLs and QOL.    Time  4    Period  Weeks    Status  New    Target Date  05/06/17      PT LONG TERM GOAL #4   Title  Pt will amb. 1000' over even/uneven terrain, while performing dynamic gait activities, with no incr. in LBP/RLE pain to improve functional mobility.     Time  4    Period  Weeks    Status  New    Target Date  05/06/17      PT LONG TERM GOAL #5   Title  Pt will demonstrate correct lifting mechanics to reduce Low back pain during activities    Time  4    Period  Weeks    Status  New    Target Date  05/06/17             Plan - 04/08/17 1329     Clinical Impression Statement  Pt is a 77 y/o male who presents to OPPT for a referral of R lumbar radiculopathy. During evaluation Pt demonstrated good motivation to improve functional ability. 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 Pt demonstrated significant gait deviations ( L lateral lean, reduced arm swing, see above) likely 2/2 R sided weakness. Pt demonstrated moderate manual muscle test weakness in RLE as compared to LLE. Pt scored a 23/30 on the functional gait assessment tool, indicating pt is at a moderate risk for falls. Pt demonstrated significant gait deviations with walking with eyes closed simulating walking in the dark) drifting to the right more than 50ft . Pt was also unable to ambulate with a narrow base of support ( tandem walking). These objective findings in addition to his PMH of strokes and pt's desires to return to an active lifestyle demonstrate his need for skilled PT. Pt would benefit from skilled PT to address these issues of balance control, gait stability, and  associated general LE strengthening. As well as, patient education to establish a well-rounded Home exercise program for pt to continue to progress safely towards his personal mobility goals.      History and Personal Factors relevant to plan of care:  carpal Tunnel Syndrome of R wrist, Ulnar neuropathy at elbow of R upper extermity, Lumbosacral radiclopathy, hx of 2 CVAs, HTN, mitral valve disease, facial neuropathy and trigeminal neuralgia (R side), HLD, hx of colon CA, hyperglycemia    Clinical Presentation  Evolving    Clinical Presentation due to:  change in pateints neurological presentation and has upcoming appt. with orthopedic MD    Clinical Decision  Making  Moderate    Rehab Potential  Good    Clinical Impairments Affecting Rehab Potential  motivation and history of physical fitness    PT Frequency  2x / week    PT Duration  4 weeks    PT  Treatment/Interventions  Gait training;Stair training;Functional mobility training;Therapeutic activities;Therapeutic exercise;Neuromuscular re-education;Patient/family education;ADLs/Self Care Home Management;Moist Heat;Manual techniques;Passive range of motion;DME Instruction;Balance training    PT Next Visit Plan  Establish HEP to address gait balance, and Proximal LE strengthening    Consulted and Agree with Plan of Care  Patient        Patient will benefit from skilled therapeutic intervention in order to improve the following deficits and impairments:  Difficulty walking, Impaired UE functional use, Postural dysfunction, Pain, Decreased strength, Decreased balance  Visit Diagnosis: Unsteadiness on feet  Other abnormalities of gait and mobility  Muscle weakness (generalized)     Problem List Patient Active Problem List   Diagnosis Date Noted  . Paresthesias 03/03/2017  . Trigeminal neuralgia of right side of face 10/01/2016  . History of mitral valve repair 09/30/2016  . Cerebrovascular accident (CVA) ([place]) 09/12/2016  . GERD (gastroesophageal reflux disease) 09/22/2015  . PCP NOTES >>>>> 11/08/2014  . Chest pain 06/07/2014  . Facial neuropathy 04/02/2013  . Anxiety 02/26/2013  . Annual physical exam 08/30/2012  . HTN (hypertension) 01/22/2011  . HYPERTROPHY PROSTATE W/O UR OBST & OTH LUTS 10/16/2008  . Hyperglycemia 10/16/2008  . Hyperlipidemia 06/15/2006  . Mitral valve disease 06/15/2006  . ALLERGIC RHINITIS 06/15/2006  . COLON CANCER, HX OF 06/15/2006    Waunita Schooner SPT 04/08/2017, 1:38 PM  [facility] [address] [place], [place], [address] Phone: [phone]   Fax:  [phone]  Name: Kevin Bass MRN: [id number] Date of Birth: [birth year]

## 2017-04-12 ENCOUNTER — Ambulatory Visit: Payer: PPO | Admitting: Neurology

## 2017-04-14 ENCOUNTER — Ambulatory Visit (INDEPENDENT_AMBULATORY_CARE_PROVIDER_SITE_OTHER): Payer: PPO | Admitting: Internal Medicine

## 2017-04-14 ENCOUNTER — Encounter: Payer: Self-pay | Admitting: Internal Medicine

## 2017-04-14 VITALS — BP 116/66 | HR 78 | Temp 98.1°F | Resp 14 | Ht 70.0 in | Wt 181.1 lb

## 2017-04-14 DIAGNOSIS — L989 Disorder of the skin and subcutaneous tissue, unspecified: Secondary | ICD-10-CM | POA: Diagnosis not present

## 2017-04-14 DIAGNOSIS — Z0000 Encounter for general adult medical examination without abnormal findings: Secondary | ICD-10-CM | POA: Diagnosis not present

## 2017-04-14 DIAGNOSIS — H11121 Conjunctival concretions, right eye: Secondary | ICD-10-CM | POA: Diagnosis not present

## 2017-04-14 DIAGNOSIS — H5711 Ocular pain, right eye: Secondary | ICD-10-CM | POA: Diagnosis not present

## 2017-04-14 DIAGNOSIS — H5789 Other specified disorders of eye and adnexa: Secondary | ICD-10-CM | POA: Diagnosis not present

## 2017-04-14 DIAGNOSIS — H02052 Trichiasis without entropian right lower eyelid: Secondary | ICD-10-CM | POA: Diagnosis not present

## 2017-04-14 NOTE — Assessment & Plan Note (Signed)
-  Td 2017 per pt;  Pneumonia shot 2006, 2012;  prevnar--2015 ; had a  flu shot  S/p Zostavax per pt , no documentation S/p shingrix #1 at [facility]  01/2017 -h/o colon ca: Last Cscope-- 09-2012 neg, next 5 years  -Has a prostate nodule, sees urology regularly, last visit 12-2016, they plan to do DREs until ~ age 77 -Labs reviewed, no due for any blood work today. -Diet-exercise discussed

## 2017-04-14 NOTE — Patient Instructions (Signed)
  GO TO THE FRONT DESK Schedule your next appointment for a  Check up in 6-8 months

## 2017-04-14 NOTE — Progress Notes (Signed)
Subjective:    Patient ID: Kevin Bass, male    DOB: 11-25-1940, 77 y.o.   MRN: [id number]  DOS:  04/14/2017 Type of visit - description : cpx Interval history: In general feeling well. Since the last visit, saw neurology and had a workup, chart reviewed.  Review of Systems Reports right eye discomfort described as TTP.  Vision is okay, no eye redness, no light intolerance.  We will see ophthalmology today. No unusual aches or pains. Good compliance with medications.  Other than above, a 14 point review of systems is negative    Past Medical History:  Diagnosis Date  . Allergy   . Anxiety   . Arthritis    hands  . Atrial fibrillation ([place])   . Colon cancer [facility])    s/p partial colectomy 1990, Cscope neg 7-09, next 2014  . DISORDER, MITRAL VALVE    MV recontruction at [place] 2006----still needs ABX for SBE prophylaxis   . Elevated PSA    [name]  . GERD (gastroesophageal reflux disease)   . Heart murmur    no problems since surgery  . HYPERGLYCEMIA, BORDERLINE 10/16/2008  . HYPERLIPIDEMIA 06/15/2006   diet controlled  . Hypertension   . HYPERTROPHY PROSTATE W/O UR OBST & OTH LUTS 10/16/2008  . Neuropathy   . Right groin pain    Dr. Redmond Pulling  . Stroke ([place])   . Wears partial dentures    lower partial    Past Surgical History:  Procedure Laterality Date  . COLECTOMY  1990  . COLONOSCOPY    . HERNIA REPAIR Left 2014   [facility] rep w/mesh- March 2014  . LOOP RECORDER INSERTION N/A 12/01/2016   Procedure: LOOP RECORDER INSERTION;  Surgeon: Evans Lance, MD;  Location: [place] CV LAB;  Service: Cardiovascular;  Laterality: N/A;  . MITRAL VALVE ANNULOPLASTY     [facility] 2006  . TEE WITHOUT CARDIOVERSION N/A 10/05/2016   Procedure: TRANSESOPHAGEAL ECHOCARDIOGRAM (TEE);  Surgeon: [name], MD;  Location: [facility] ENDOSCOPY;  Service: Cardiovascular;  Laterality: N/A;    Social History   Socioeconomic History  . Marital status: Married    Spouse name: suzanne  .  Number of children: 2  . Years of education: college  . Highest education level: Not on file  Social Needs  . Financial resource strain: Not on file  . Food insecurity - worry: Not on file  . Food insecurity - inability: Not on file  . Transportation needs - medical: Not on file  . Transportation needs - non-medical: Not on file  Occupational History  . Occupation: retired   Tobacco Use  . Smoking status: Former Smoker    Packs/day: 0.50    Years: 20.00    Pack years: 10.00    Types: Cigarettes    Last attempt to quit: 02/23/1983    Years since quitting: 34.1  . Smokeless tobacco: Never Used  Substance and Sexual Activity  . Alcohol use: Yes    Alcohol/week: 4.2 oz    Types: 7 Glasses of wine per week  . Drug use: No  . Sexual activity: Not on file  Other Topics Concern  . Not on file  Social History Narrative   From [place], household pt and wife   Daughter in [place], son in [place]        Family History  Problem Relation Age of Onset  . Colon cancer Other        M side (cousins)  . Diabetes Neg  Hx   . Stroke Neg Hx   . CAD Neg Hx   . Prostate cancer Neg Hx   . Rectal cancer Neg Hx   . Esophageal cancer Neg Hx     Allergies as of 04/14/2017      Reactions   Statins Other (See Comments)   Myalgias (intolerance)      Medication List        Accurate as of 04/14/17  8:58 PM. Always use your most recent med list.          ALPRAZolam 0.25 MG tablet Commonly known as:  XANAX Take 1-2 tablets (0.25-0.5 mg total) by mouth 2 (two) times daily as needed for anxiety.   apixaban 5 MG Tabs tablet Commonly known as:  ELIQUIS Take 1 tablet (5 mg total) by mouth 2 (two) times daily.   Arginine 500 MG Caps Take 1,500 mg by mouth daily.   ezetimibe 10 MG tablet Commonly known as:  ZETIA Take 1 tablet (10 mg total) by mouth daily.   FISH OIL PO Take 1,000 mg by mouth daily.   gabapentin 300 MG capsule Commonly known as:  NEURONTIN Take 1 cap in AM, 1 cap at dinner, 2  caps at bedtime   JOINT HEALTH PO Take 1 tablet by mouth daily. JARROW JOINT BUILDER   loratadine 10 MG tablet Commonly known as:  CLARITIN Take 10 mg by mouth at bedtime.   losartan 25 MG tablet Commonly known as:  COZAAR Take 1 tablet (25 mg total) by mouth 2 (two) times daily.   LYCOPENE PO Take 1 capsule by mouth daily. JARROW FORMULA LYCO-SORB PROSTATE SUPPORT   OCUVITE PRESERVISION PO Take 1 tablet by mouth 2 (two) times daily.   omeprazole 40 MG capsule Commonly known as:  PRILOSEC Take 1 capsule (40 mg total) by mouth daily.   RED YEAST RICE PO Take 1 tablet by mouth daily. MORNING & AFTERNOON   rosuvastatin 20 MG tablet Commonly known as:  CRESTOR Take 1 tablet (20 mg total) at bedtime by mouth.          Objective:   Physical Exam BP 116/66 (BP Location: Left Arm, Patient Position: Sitting, Cuff Size: Small)   Pulse 78   Temp 98.1 F (36.7 C) (Oral)   Resp 14   Ht 5\' 10"  (1.778 m)   Wt 181 lb 2 oz (82.2 kg)   SpO2 93%   BMI 25.99 kg/m  General:   Well developed, well nourished . NAD.  Neck: No  thyromegaly  HEENT:  Normocephalic . Face symmetric, atraumatic. EOMI, pupils equal/reactive, no photophobia, no redness  Lungs:  CTA B Normal respiratory effort, no intercostal retractions, no accessory muscle use. Heart: RRR,  no murmur.  No pretibial edema bilaterally  Abdomen:  Not distended, soft, non-tender. No rebound or rigidity.   Skin: Exposed areas without rash. Not pale. Not jaundice Neurologic:  alert & oriented X3.  Speech normal, gait appropriate for age and unassisted Strength symmetric and appropriate for age.  Psych: Cognition and judgment appear intact.  Cooperative with normal attention span and concentration.  Behavior appropriate. No anxious or depressed appearing.     Assessment & Plan:   Assessment Prediabetes HTN Hyperlipidemia, statin intolerant (simva-pravachol: shoulder pain) Anxiety- rarely takes xanax Facial  neuropathy dx 2015, no w/u, sx resurface x1 after temporary d/c of meds  CV: --Mitral valve annuloplasty 2006-- needs SBE prophylaxis -- SOB 04-2016, [facility], had a ECHO Nl fx MV, cath: no  CAD --Cryptogenic stroke 09/12/2016, had slurred speech. Was recommended a loop recorder.started plavix -- new R PICA stroke, admitted 11-30-16, implanted loop recorder placed --A-FIB noted on ILR ~ 12-21-2016, changed to eliquis  Elevated PSA and BPH-- Dr. [name] Colon cancer partial colectomy 1990, colonoscopy 08-2007 negative, Cscope again 09-2012 normal, 5 years   PLAN:  Chronic medical problems: All seem to be controlled, last cholesterol satisfactory, rarely takes Xanax. Paresthesias:   See last OV, was eval by neurology after the last visit, workup for neuropathy initiated (negative or normal N17, folic acid, SPEP, sed rate and TSH), gabapentin prescribed, NCS showed right ulnar neuropathy, question of L5 radiculopathy, mild.  On Gabapentin without much help, eventually they agreed to refer to surgery for CTS eval and do  PT for L5 radiculopathy. RTC 6-8 months

## 2017-04-14 NOTE — Progress Notes (Signed)
Pre visit review using our clinic review tool, if applicable. No additional management support is needed unless otherwise documented below in the visit note. 

## 2017-04-14 NOTE — Assessment & Plan Note (Signed)
Chronic medical problems: All seem to be controlled, last cholesterol satisfactory, rarely takes Xanax. Paresthesias:   See last OV, was eval by neurology after the last visit, workup for neuropathy initiated (negative or normal H20, folic acid, SPEP, sed rate and TSH), gabapentin prescribed, NCS showed right ulnar neuropathy, question of L5 radiculopathy, mild.  On Gabapentin without much help, eventually they agreed to refer to surgery for CTS eval and do  PT for L5 radiculopathy. RTC 6-8 months

## 2017-04-25 ENCOUNTER — Ambulatory Visit: Payer: PPO | Attending: Neurology | Admitting: Physical Therapy

## 2017-04-25 DIAGNOSIS — M5416 Radiculopathy, lumbar region: Secondary | ICD-10-CM | POA: Diagnosis not present

## 2017-04-25 DIAGNOSIS — M6281 Muscle weakness (generalized): Secondary | ICD-10-CM | POA: Diagnosis not present

## 2017-04-25 DIAGNOSIS — I639 Cerebral infarction, unspecified: Secondary | ICD-10-CM | POA: Diagnosis not present

## 2017-04-25 DIAGNOSIS — R2689 Other abnormalities of gait and mobility: Secondary | ICD-10-CM

## 2017-04-25 DIAGNOSIS — R2681 Unsteadiness on feet: Secondary | ICD-10-CM | POA: Diagnosis not present

## 2017-04-25 NOTE — Patient Instructions (Addendum)
Resisted - Four Way Hip     With theraband around right ankle, balance on left leg and complete leg kicks in the following directions:  1. Facing toward theraband, extend right leg behind you with knee straight. Avoid bending forward at your hips. Repeat 10 times. 2. Turn to right, slowly kick leg out to the side while keeping your toes facing forward. Avoid leaning to the side. Repeat 10 times. 3. Turn to face away from theraband, kick leg straight forward keeping knee straight. Repeat 10 times. 4. Turn to the right and take a step away from the theraband. Pull right leg in and slightly in front of left. Repeat 10 times.  Then repeat the 4 positions with the band around the other ankle.Feet Heel-Toe "Tandem", Head Motion - Eyes Open     Standing in a corner with a chair in front of for your balance With eyes open, right foot directly in front of the other, move head slowly: left and right. Repeat __10__ times per session. Do __3__ sessions per day. Tandem Walking     HOLDING ON TO THE COUNTER FOR BALANCE AND SUPPORT Walk with each foot directly in front of other, heel of one foot touching toes of other foot with each step. Both feet straight ahead. 10 steps 3 times daily    Copyright  VHI. All rights reserved.   Copyright  VHI. All rights reserved.

## 2017-04-25 NOTE — Therapy (Signed)
[facility] [address] [place], [place], [address] Phone: [phone]   Fax:  [phone]  Physical Therapy Treatment  Patient Details  Name: Kevin Bass MRN: [id number] Date of Birth: [birth year] Referring Provider: Ellouise Newer MD   Encounter Date: 04/25/2017  PT End of Session - 04/25/17 1611    Visit Number  2    Number of Visits  8    Date for PT Re-Evaluation  05/06/17    PT Start Time  5732    PT Stop Time  1530    PT Time Calculation (min)  45 min    Activity Tolerance  Patient tolerated treatment well    Behavior During Therapy  [facility] for tasks assessed/performed       Past Medical History:  Diagnosis Date  . Allergy   . Anxiety   . Arthritis    hands  . Atrial fibrillation ([place])   . Colon cancer [facility])    s/p partial colectomy 1990, Cscope neg 7-09, next 2014  . DISORDER, MITRAL VALVE    MV recontruction at [place] 2006----still needs ABX for SBE prophylaxis   . Elevated PSA    [name]  . GERD (gastroesophageal reflux disease)   . Heart murmur    no problems since surgery  . HYPERGLYCEMIA, BORDERLINE 10/16/2008  . HYPERLIPIDEMIA 06/15/2006   diet controlled  . Hypertension   . HYPERTROPHY PROSTATE W/O UR OBST & OTH LUTS 10/16/2008  . Neuropathy   . Right groin pain    Dr. Redmond Pulling  . Stroke ([place])   . Wears partial dentures    lower partial    Past Surgical History:  Procedure Laterality Date  . COLECTOMY  1990  . COLONOSCOPY    . HERNIA REPAIR Left 2014   [facility] rep w/mesh- March 2014  . LOOP RECORDER INSERTION N/A 12/01/2016   Procedure: LOOP RECORDER INSERTION;  Surgeon: Evans Lance, MD;  Location: [place] CV LAB;  Service: Cardiovascular;  Laterality: N/A;  . MITRAL VALVE ANNULOPLASTY     [facility] 2006  . TEE WITHOUT CARDIOVERSION N/A 10/05/2016   Procedure: TRANSESOPHAGEAL ECHOCARDIOGRAM (TEE);  Surgeon: [name], MD;  Location: [facility] ENDOSCOPY;  Service: Cardiovascular;   Laterality: N/A;    There were no vitals filed for this visit.  Subjective Assessment - 04/25/17 1450    Subjective  pt reports no falls and pain has not changed much.     Pertinent History  Hx of 2 CVAs in 2018, HTN, mitral valve disease, facial neuropathy and trigeminal neuralgia R side of face, HLD, hx of colon CA, hyperglycemia    Limitations  Other (comment)    Patient Stated Goals  return to safe gym activites to continue to maintain fitness levels    Currently in Pain?  No/denies                      [facility] Adult PT Treatment/Exercise - 04/25/17 1445      Transfers   Transfers  Sit to Stand;Stand to Sit    Sit to Stand  6: Modified independent (Device/Increase time);From chair/3-in-1;Without upper extremity assist    Stand to Sit  6: Modified independent (Device/Increase time);Without upper extremity assist      Ambulation/Gait   Ambulation/Gait  Yes    Ambulation/Gait Assistance  6: Modified independent (Device/Increase time)    Ambulation Distance (Feet)  150 Feet    Assistive device  None    Gait Pattern  Decreased  arm swing - right;Decreased dorsiflexion - right;Lateral trunk lean to left;Decreased trunk rotation    Ambulation Surface  Level;Indoor      High Level Balance   High Level Balance Activities  Braiding    High Level Balance Comments  moderate verbal and tactile cues for step pattern for braiding, exercise not safe for HEP at this time      Exercises   Exercises  Knee/Hip;Other Exercises See patient instruction for details of HEP, 8 min SCIFIT      Knee/Hip Exercises: Standing   Hip Flexion  Stengthening;Both;1 set;10 reps blue theraband 1x10     Hip ADduction  Strengthening;Both;1 set;10 reps blue theraband    Hip Abduction  Stengthening;Both;1 set;10 reps blue theraband    Hip Extension  Both;1 set;10 reps;Stengthening blue theraband          Balance Exercises - 04/25/17 1445      Balance Exercises: Standing   Tandem Stance  Eyes  open;2 reps;30 secs;Intermittent upper extremity support head turning side to side in couner for balance(HEP)    Tandem Gait  Forward;Upper extremity support;2 reps next to counter for UE support during HEP 2x 15'        PT Education - 04/25/17 1610    Education provided  Yes    Education Details  HEP, SCIFIT set up and use here and in his local [employer], Optometrist, HEP safety,     Person(s) Educated  Patient    Methods  Explanation;Demonstration;Handout;Verbal cues;Tactile cues    Comprehension  Verbalized understanding;Returned demonstration       PT Short Term Goals - 04/08/17 1333      PT SHORT TERM GOAL #1   Title  Same as LTGs        PT Long Term Goals - 04/08/17 1333      PT LONG TERM GOAL #1   Title  Pt will be independent with initial HEP to improve functional mobility and show home progression(ALL GOALS TARGET DATE 05/06/2017)    Time  4    Period  Weeks    Status  New    Target Date  05/06/17      PT LONG TERM GOAL #2   Title  Pt will perform FGA with a score of 29/30 in order to reduce falls risk     Time  4    Period  Weeks    Status  New    Target Date  05/06/17      PT LONG TERM GOAL #3   Title  Pt will report </=1/10 LBP/RLE pain during gardening activities to improve function during ADLs and QOL.    Time  4    Period  Weeks    Status  New    Target Date  05/06/17      PT LONG TERM GOAL #4   Title  Pt will amb. 1000' over even/uneven terrain, while performing dynamic gait activities, with no incr. in LBP/RLE pain to improve functional mobility.     Time  4    Period  Weeks    Status  New    Target Date  05/06/17      PT LONG TERM GOAL #5   Title  Pt will demonstrate correct lifting mechanics to reduce Low back pain during activities    Time  4    Period  Weeks    Status  New    Target Date  05/06/17  Plan - 04/25/17 1612    Clinical Impression Statement  Today pt's HEP was established focusing on proximal hip strengthening,  static and dynamic gait balance. Pt demonstrated understanding of HEP exercise safety via teach back and has been instructed to report back on ability to complete exercise safely at home. Pt is eager to return to his PLOF and is looking forward to being more active both in PT sessions and at home in his local gym. Pt required moderate tactile and verbal cueing to maintain safety during braiding exercise demonstrated difficulty understanding foot placement. Pt will continue to benefit from skilled PT intervention to continue to address issues noted above.     Rehab Potential  Good    Clinical Impairments Affecting Rehab Potential  motivation and history of physical fitness    PT Frequency  2x / week    PT Duration  4 weeks    PT Treatment/Interventions  Gait training;Stair training;Functional mobility training;Therapeutic activities;Therapeutic exercise;Neuromuscular re-education;Patient/family education;ADLs/Self Care Home Management;Moist Heat;Manual techniques;Passive range of motion;DME Instruction;Balance training    PT Next Visit Plan  Assess safety on treadmill and pt ability to do treadmill work at his [employer]. Review HEP exercise progress as appropriate continue with high level balance training and gait tasks  Add weight machines to [facility] program.   Consulted and Agree with Plan of Care  Patient       Patient will benefit from skilled therapeutic intervention in order to improve the following deficits and impairments:  Difficulty walking, Impaired UE functional use, Postural dysfunction, Pain, Decreased strength, Decreased balance, Decreased knowledge of use of DME, Abnormal gait, Impaired sensation  Visit Diagnosis: Unsteadiness on feet  Other abnormalities of gait and mobility  Muscle weakness (generalized)  Radiculopathy, lumbar region     Problem List Patient Active Problem List   Diagnosis Date Noted  . Paresthesias 03/03/2017  . Trigeminal neuralgia of right side of face  10/01/2016  . History of mitral valve repair 09/30/2016  . Cerebrovascular accident (CVA) ([place]) 09/12/2016  . GERD (gastroesophageal reflux disease) 09/22/2015  . PCP NOTES >>>>> 11/08/2014  . Chest pain 06/07/2014  . Facial neuropathy 04/02/2013  . Anxiety 02/26/2013  . Annual physical exam 08/30/2012  . HTN (hypertension) 01/22/2011  . HYPERTROPHY PROSTATE W/O UR OBST & OTH LUTS 10/16/2008  . Hyperglycemia 10/16/2008  . Hyperlipidemia 06/15/2006  . Mitral valve disease 06/15/2006  . ALLERGIC RHINITIS 06/15/2006  . COLON CANCER, HX OF 06/15/2006    Waunita Schooner SPT 04/25/2017, 4:23 PM  [facility] [address] [place], [place], [address] Phone: [phone]   Fax:  [phone]  Name: Kevin Bass MRN: [id number] Date of Birth: 09-21-1940

## 2017-04-28 ENCOUNTER — Encounter (INDEPENDENT_AMBULATORY_CARE_PROVIDER_SITE_OTHER): Payer: Self-pay | Admitting: Specialist

## 2017-04-28 ENCOUNTER — Ambulatory Visit (INDEPENDENT_AMBULATORY_CARE_PROVIDER_SITE_OTHER): Payer: PPO | Admitting: Specialist

## 2017-04-28 ENCOUNTER — Ambulatory Visit (INDEPENDENT_AMBULATORY_CARE_PROVIDER_SITE_OTHER): Payer: PPO

## 2017-04-28 VITALS — BP 127/76 | HR 69 | Ht 70.0 in | Wt 175.0 lb

## 2017-04-28 DIAGNOSIS — G5601 Carpal tunnel syndrome, right upper limb: Secondary | ICD-10-CM | POA: Diagnosis not present

## 2017-04-28 DIAGNOSIS — G5621 Lesion of ulnar nerve, right upper limb: Secondary | ICD-10-CM

## 2017-04-28 DIAGNOSIS — M545 Low back pain: Secondary | ICD-10-CM

## 2017-04-28 NOTE — Patient Instructions (Addendum)
Vitamin B12, B6 and folate, thiamine Splinting the elbow to prevent flexion and wrist splinting can improve the pain and decrease the numbness. Tylenol ES  Up to tid or QID  For pain and inflamation.

## 2017-04-28 NOTE — Progress Notes (Signed)
Office Visit Note   Patient: Kevin Bass           Date of Birth: 1940/09/25           MRN: [id number] Visit Date: 04/28/2017              Requested by: Colon Branch, [address] [place], [place] [address] PCP: Colon Branch, MD   Assessment & Plan: Visit Diagnoses:  1. Low back pain, unspecified back pain laterality, unspecified chronicity, with sciatica presence unspecified   2. Cubital tunnel syndrome on right   3. Carpal tunnel syndrome, right upper limb     Plan:Vitamin B12, B6 and folate, thiamine Splinting the elbow to prevent flexion and wrist splinting can improve the pain and decrease the numbness. Tylenol ES  Up to tid or QID  For pain and inflamation.  Follow-Up Instructions: No Follow-up on file.   Orders:  Orders Placed This Encounter  Procedures  . XR Lumbar Spine 2-3 Views   No orders of the defined types were placed in this encounter.     Procedures: No procedures performed   Clinical Data: No additional findings.   Subjective: Chief Complaint  Patient presents with  . Lower Back - Pain    77 year old male with numbness and tingling in the leg with feelings of hot and cold, was seen by Dr.Aquino and evaluated and found that he had ulnar neuropathy and   Right median nerve neuropathy that is mild and right L5 radiculopathy on the tests performed 03/02/2017 by  Dr. [name]. He has a history of a stroke in July and again in October 2018, now he  Has a lope monitor of the heart to assess for arrythmia, has been found to have atrial fibrillation. Loop monitor and he is on eloquis. He had right sided symptoms. The right hand and foot only started a couple of months ago. Both the right arm and the right leg are troublesome, the arm has hot and cold sensations and the same time and at different times. No neck pain,no left arm weakness or numbness or left leg numbness or weakness. He is right hand dominant. No bowel or bladder difficulty.  He is able to walk a couple of miles a day in the    Review of Systems  Constitutional: Negative.   HENT: Negative.   Eyes: Negative.   Respiratory: Negative.   Cardiovascular: Negative.   Gastrointestinal: Negative.   Endocrine: Negative.   Genitourinary: Negative.   Musculoskeletal: Negative.   Skin: Negative.   Allergic/Immunologic: Negative.   Neurological: Negative.   Hematological: Negative.   Psychiatric/Behavioral: Negative.      Objective: Vital Signs: BP 127/76 (BP Location: Left Arm, Patient Position: Sitting)   Pulse 69   Ht 5\' 10"  (1.778 m)   Wt 175 lb (79.4 kg)   BMI 25.11 kg/m   Physical Exam  Back Exam   Tenderness  The patient is experiencing tenderness in the lumbar.  Range of Motion  Extension: normal  Flexion: normal  Lateral bend right: normal  Lateral bend left: normal  Rotation right: normal  Rotation left: normal   Muscle Strength  Right Quadriceps:  5/5  Left Quadriceps:  5/5  Right Hamstrings:  5/5  Left Hamstrings:  5/5   Tests  Straight leg raise right: negative Straight leg raise left: negative  Reflexes  Patellar: normal Achilles: normal Biceps: normal Babinski's sign: normal   Other  Toe walk:  normal Heel walk: normal Sensation: normal Gait: normal  Erythema: no back redness Scars: absent  Comments:  Some decreased motion turning his head to theright and in extension      Specialty Comments:  No specialty comments available.  Imaging: No results found.   PMFS History: Patient Active Problem List   Diagnosis Date Noted  . Paresthesias 03/03/2017  . Trigeminal neuralgia of right side of face 10/01/2016  . History of mitral valve repair 09/30/2016  . Cerebrovascular accident (CVA) ([place]) 09/12/2016  . GERD (gastroesophageal reflux disease) 09/22/2015  . PCP NOTES >>>>> 11/08/2014  . Chest pain 06/07/2014  . Facial neuropathy 04/02/2013  . Anxiety 02/26/2013  . Annual physical exam 08/30/2012  .  HTN (hypertension) 01/22/2011  . HYPERTROPHY PROSTATE W/O UR OBST & OTH LUTS 10/16/2008  . Hyperglycemia 10/16/2008  . Hyperlipidemia 06/15/2006  . Mitral valve disease 06/15/2006  . ALLERGIC RHINITIS 06/15/2006  . COLON CANCER, HX OF 06/15/2006   Past Medical History:  Diagnosis Date  . Allergy   . Anxiety   . Arthritis    hands  . Atrial fibrillation ([place])   . Colon cancer [facility])    s/p partial colectomy 1990, Cscope neg 7-09, next 2014  . DISORDER, MITRAL VALVE    MV recontruction at [place] 2006----still needs ABX for SBE prophylaxis   . Elevated PSA    [name]  . GERD (gastroesophageal reflux disease)   . Heart murmur    no problems since surgery  . HYPERGLYCEMIA, BORDERLINE 10/16/2008  . HYPERLIPIDEMIA 06/15/2006   diet controlled  . Hypertension   . HYPERTROPHY PROSTATE W/O UR OBST & OTH LUTS 10/16/2008  . Neuropathy   . Right groin pain    Dr. Redmond Pulling  . Stroke ([place])   . Wears partial dentures    lower partial    Family History  Problem Relation Age of Onset  . Colon cancer Other        M side (cousins)  . Diabetes Neg Hx   . Stroke Neg Hx   . CAD Neg Hx   . Prostate cancer Neg Hx   . Rectal cancer Neg Hx   . Esophageal cancer Neg Hx     Past Surgical History:  Procedure Laterality Date  . COLECTOMY  1990  . COLONOSCOPY    . HERNIA REPAIR Left 2014   [facility] rep w/mesh- March 2014  . LOOP RECORDER INSERTION N/A 12/01/2016   Procedure: LOOP RECORDER INSERTION;  Surgeon: Evans Lance, MD;  Location: [place] CV LAB;  Service: Cardiovascular;  Laterality: N/A;  . MITRAL VALVE ANNULOPLASTY     [facility] 2006  . TEE WITHOUT CARDIOVERSION N/A 10/05/2016   Procedure: TRANSESOPHAGEAL ECHOCARDIOGRAM (TEE);  Surgeon: [name], MD;  Location: [facility] ENDOSCOPY;  Service: Cardiovascular;  Laterality: N/A;   Social History   Occupational History  . Occupation: retired   Tobacco Use  . Smoking status: Former Smoker    Packs/day: 0.50    Years: 20.00    Pack years:  10.00    Types: Cigarettes    Last attempt to quit: 02/23/1983    Years since quitting: 34.2  . Smokeless tobacco: Never Used  Substance and Sexual Activity  . Alcohol use: Yes    Alcohol/week: 4.2 oz    Types: 7 Glasses of wine per week  . Drug use: No  . Sexual activity: Not on file

## 2017-04-29 ENCOUNTER — Ambulatory Visit: Payer: PPO

## 2017-04-29 DIAGNOSIS — R2689 Other abnormalities of gait and mobility: Secondary | ICD-10-CM

## 2017-04-29 DIAGNOSIS — M6281 Muscle weakness (generalized): Secondary | ICD-10-CM

## 2017-04-29 DIAGNOSIS — R2681 Unsteadiness on feet: Secondary | ICD-10-CM

## 2017-04-29 NOTE — Patient Instructions (Addendum)
Resisted - Four Way Hip     With theraband around right ankle, balance on left leg and complete leg kicks in the following directions: 2 sets of 10 reps, three days a week.   1. Facing toward theraband, extend right leg behind you with knee straight. Avoid bending forward at your hips.  2. Turn to right, slowly kick leg out to the side while keeping your toes facing forward. Avoid leaning to the side.  3. Turn to face away from theraband, kick leg straight forward keeping knee straight.  4. Turn to the right and take a step away from the theraband. Pull right leg in and slightly in front of left.

## 2017-04-29 NOTE — Therapy (Signed)
[facility] [address] [place], [place], [address] Phone: [phone]   Fax:  [phone]  Physical Therapy Treatment  Patient Details  Name: Kevin Bass MRN: [id number] Date of Birth: August 30, 1940 Referring Provider: Ellouise Newer MD   Encounter Date: 04/29/2017  PT End of Session - 04/29/17 0828    Visit Number  3    Number of Visits  8    Date for PT Re-Evaluation  05/06/17    PT Start Time  0807 pt late    PT Stop Time  0845    PT Time Calculation (min)  38 min    Equipment Utilized During Treatment  -- safety lanyard on treadmill    Activity Tolerance  Patient tolerated treatment well    Behavior During Therapy  [facility] for tasks assessed/performed       Past Medical History:  Diagnosis Date  . Allergy   . Anxiety   . Arthritis    hands  . Atrial fibrillation ([place])   . Colon cancer [facility])    s/p partial colectomy 1990, Cscope neg 7-09, next 2014  . DISORDER, MITRAL VALVE    MV recontruction at [place] 2006----still needs ABX for SBE prophylaxis   . Elevated PSA    [name]  . GERD (gastroesophageal reflux disease)   . Heart murmur    no problems since surgery  . HYPERGLYCEMIA, BORDERLINE 10/16/2008  . HYPERLIPIDEMIA 06/15/2006   diet controlled  . Hypertension   . HYPERTROPHY PROSTATE W/O UR OBST & OTH LUTS 10/16/2008  . Neuropathy   . Right groin pain    Dr. Redmond Pulling  . Stroke ([place])   . Wears partial dentures    lower partial    Past Surgical History:  Procedure Laterality Date  . COLECTOMY  1990  . COLONOSCOPY    . HERNIA REPAIR Left 2014   [facility] rep w/mesh- March 2014  . LOOP RECORDER INSERTION N/A 12/01/2016   Procedure: LOOP RECORDER INSERTION;  Surgeon: Evans Lance, MD;  Location: [place] CV LAB;  Service: Cardiovascular;  Laterality: N/A;  . MITRAL VALVE ANNULOPLASTY     [facility] 2006  . TEE WITHOUT CARDIOVERSION N/A 10/05/2016   Procedure: TRANSESOPHAGEAL ECHOCARDIOGRAM (TEE);   Surgeon: [name], MD;  Location: [facility] ENDOSCOPY;  Service: Cardiovascular;  Laterality: N/A;    There were no vitals filed for this visit.  Subjective Assessment - 04/29/17 0809    Subjective  Pt reported orthopedic MD told pt imaging showed "normal wear and tear of Lx spine" and did not require surgery. He recommended a splint for carpel tunnel syndrome. Pt reports he tries to walk 1 mile around the neighborhood, with his brother, once or twice a day as he's able.     Pertinent History  Hx of 2 CVAs in 2018, HTN, mitral valve disease, facial neuropathy and trigeminal neuralgia R side of face, HLD, hx of colon CA, hyperglycemia    Patient Stated Goals  return to safe gym activites to continue to maintain fitness levels    Currently in Pain?  No/denies             Therex: Pt performed standing hip therex with blue band. Cues for technique and improved eccentric control. Please see pt instructions for HEP details. No pain reported during session.         [facility] Adult PT Treatment/Exercise - 04/29/17 0814      Knee/Hip Exercises: Aerobic   Tread Mill  16 minutes, with  BUE to one UE support, to ensure safety at gym. Started at 3% incline and 2.66mph, incr. speed and incline after 5 minute warm-up. Pt reported moderate effort ~12/20 on RPE scale at 7 minutes. Pt then incr. speed to 2.5mph. distance: 0.65 miles. RPE at end of treadmill: ~12/20. BP after treadmill: 113/72 and HR 81bpm. SaO2 on room air: 99%.             PT Education - 04/29/17 0827    Education provided  Yes    Education Details  PT reviewed strengthening HEP. PT discussed gym safety on treadmill and gradually incr. speed/incline and time as tolerated.     Person(s) Educated  Patient    Methods  Explanation    Comprehension  Verbalized understanding       PT Short Term Goals - 04/08/17 1333      PT SHORT TERM GOAL #1   Title  Same as LTGs        PT Long Term Goals - 04/08/17 1333      PT LONG  TERM GOAL #1   Title  Pt will be independent with initial HEP to improve functional mobility and show home progression(ALL GOALS TARGET DATE 05/06/2017)    Time  4    Period  Weeks    Status  New    Target Date  05/06/17      PT LONG TERM GOAL #2   Title  Pt will perform FGA with a score of 29/30 in order to reduce falls risk     Time  4    Period  Weeks    Status  New    Target Date  05/06/17      PT LONG TERM GOAL #3   Title  Pt will report </=1/10 LBP/RLE pain during gardening activities to improve function during ADLs and QOL.    Time  4    Period  Weeks    Status  New    Target Date  05/06/17      PT LONG TERM GOAL #4   Title  Pt will amb. 1000' over even/uneven terrain, while performing dynamic gait activities, with no incr. in LBP/RLE pain to improve functional mobility.     Time  4    Period  Weeks    Status  New    Target Date  05/06/17      PT LONG TERM GOAL #5   Title  Pt will demonstrate correct lifting mechanics to reduce Low back pain during activities    Time  4    Period  Weeks    Status  New    Target Date  05/06/17            Plan - 04/29/17 0829    Clinical Impression Statement  Pt demonstrated progress, as pt tolerated treadmill training well. Pt also able to perform strengthening HEP well and without pain. Pt did require cues for improved eccentric control during therex but quickly corrected with cues. Pt would continue to benefit from skilled PT to improve pain and safety during functional mobility.     Rehab Potential  Good    Clinical Impairments Affecting Rehab Potential  motivation and history of physical fitness    PT Frequency  2x / week    PT Duration  4 weeks    PT Treatment/Interventions  Gait training;Stair training;Functional mobility training;Therapeutic activities;Therapeutic exercise;Neuromuscular re-education;Patient/family education;ADLs/Self Care Home Management;Moist Heat;Manual techniques;Passive range of motion;DME  Instruction;Balance training  PT Next Visit Plan   Review balance HEP exercise progress as appropriate continue with high level balance training and gait tasks   Add weight machines to [facility] program.    Consulted and Agree with Plan of Care  Patient       Patient will benefit from skilled therapeutic intervention in order to improve the following deficits and impairments:  Difficulty walking, Impaired UE functional use, Postural dysfunction, Pain, Decreased strength, Decreased balance, Decreased knowledge of use of DME, Abnormal gait, Impaired sensation  Visit Diagnosis: Other abnormalities of gait and mobility  Muscle weakness (generalized)  Unsteadiness on feet     Problem List Patient Active Problem List   Diagnosis Date Noted  . Paresthesias 03/03/2017  . Trigeminal neuralgia of right side of face 10/01/2016  . History of mitral valve repair 09/30/2016  . Cerebrovascular accident (CVA) ([place]) 09/12/2016  . GERD (gastroesophageal reflux disease) 09/22/2015  . PCP NOTES >>>>> 11/08/2014  . Chest pain 06/07/2014  . Facial neuropathy 04/02/2013  . Anxiety 02/26/2013  . Annual physical exam 08/30/2012  . HTN (hypertension) 01/22/2011  . HYPERTROPHY PROSTATE W/O UR OBST & OTH LUTS 10/16/2008  . Hyperglycemia 10/16/2008  . Hyperlipidemia 06/15/2006  . Mitral valve disease 06/15/2006  . ALLERGIC RHINITIS 06/15/2006  . COLON CANCER, HX OF 06/15/2006    Julitza Rickles L 04/29/2017, 8:47 AM  [facility] [address] [place], [place], [address] Phone: [phone]   Fax:  [phone]  Name: Kevin Bass MRN: [id number] Date of Birth: 1940/10/03  Geoffry Paradise, PT,DPT 04/29/17 8:48 AM Phone: [phone] Fax: [phone]

## 2017-05-02 ENCOUNTER — Ambulatory Visit (INDEPENDENT_AMBULATORY_CARE_PROVIDER_SITE_OTHER): Payer: PPO | Admitting: *Deleted

## 2017-05-02 ENCOUNTER — Encounter: Payer: Self-pay | Admitting: Physical Therapy

## 2017-05-02 ENCOUNTER — Ambulatory Visit: Payer: PPO | Admitting: Physical Therapy

## 2017-05-02 DIAGNOSIS — R2689 Other abnormalities of gait and mobility: Secondary | ICD-10-CM

## 2017-05-02 DIAGNOSIS — I639 Cerebral infarction, unspecified: Secondary | ICD-10-CM | POA: Diagnosis not present

## 2017-05-02 DIAGNOSIS — M6281 Muscle weakness (generalized): Secondary | ICD-10-CM

## 2017-05-02 DIAGNOSIS — R2681 Unsteadiness on feet: Secondary | ICD-10-CM

## 2017-05-02 DIAGNOSIS — M5416 Radiculopathy, lumbar region: Secondary | ICD-10-CM

## 2017-05-02 NOTE — Therapy (Signed)
[facility] [address] [place], [place], [address] Phone: [phone]   Fax:  [phone]  Physical Therapy Treatment  Patient Details  Name: Kevin Bass MRN: [id number] Date of Birth: 1940/04/16 Referring Provider: Ellouise Newer MD   Encounter Date: 05/02/2017  PT End of Session - 05/02/17 1709    Visit Number  4    Number of Visits  8    Date for PT Re-Evaluation  05/06/17    PT Start Time  6283    PT Stop Time  1545    PT Time Calculation (min)  60 min    Activity Tolerance  Patient tolerated treatment well    Behavior During Therapy  [facility] for tasks assessed/performed       Past Medical History:  Diagnosis Date  . Allergy   . Anxiety   . Arthritis    hands  . Atrial fibrillation ([place])   . Colon cancer [facility])    s/p partial colectomy 1990, Cscope neg 7-09, next 2014  . DISORDER, MITRAL VALVE    MV recontruction at [place] 2006----still needs ABX for SBE prophylaxis   . Elevated PSA    [name]  . GERD (gastroesophageal reflux disease)   . Heart murmur    no problems since surgery  . HYPERGLYCEMIA, BORDERLINE 10/16/2008  . HYPERLIPIDEMIA 06/15/2006   diet controlled  . Hypertension   . HYPERTROPHY PROSTATE W/O UR OBST & OTH LUTS 10/16/2008  . Neuropathy   . Right groin pain    Dr. Redmond Pulling  . Stroke ([place])   . Wears partial dentures    lower partial    Past Surgical History:  Procedure Laterality Date  . COLECTOMY  1990  . COLONOSCOPY    . HERNIA REPAIR Left 2014   [facility] rep w/mesh- March 2014  . LOOP RECORDER INSERTION N/A 12/01/2016   Procedure: LOOP RECORDER INSERTION;  Surgeon: Evans Lance, MD;  Location: [place] CV LAB;  Service: Cardiovascular;  Laterality: N/A;  . MITRAL VALVE ANNULOPLASTY     [facility] 2006  . TEE WITHOUT CARDIOVERSION N/A 10/05/2016   Procedure: TRANSESOPHAGEAL ECHOCARDIOGRAM (TEE);  Surgeon: [name], MD;  Location: [facility] ENDOSCOPY;  Service: Cardiovascular;   Laterality: N/A;    There were no vitals filed for this visit.  Subjective Assessment - 05/02/17 1453    Subjective  pt reports mild LBP after being given his HEP. pt  went to othopedist and he reports to [place] that they told him he has mild degeneration in his low back. pt had questions about PT goals what they are and how to progress.     Pertinent History  Hx of 2 CVAs in 2018, HTN, mitral valve disease, facial neuropathy and trigeminal neuralgia R side of face, HLD, hx of colon CA, hyperglycemia    Limitations  Other (comment)    Patient Stated Goals  return to safe gym activites to continue to maintain fitness levels    Currently in Pain?  Yes    Pain Score  5     Pain Location  Back    Pain Orientation  Lower    Pain Descriptors / Indicators  Aching    Pain Onset  1 to 4 weeks ago    Pain Frequency  Intermittent    Aggravating Factors   unknown    Pain Relieving Factors  Medication    Effect of Pain on Daily Activities  no    Multiple Pain Sites  No  [facility] PT Assessment - 05/02/17 1445      Functional Gait  Assessment   Gait assessed   Yes    Gait Level Surface  Walks 20 ft in less than 5.5 sec, no assistive devices, good speed, no evidence for imbalance, normal gait pattern, deviates no more than 6 in outside of the 12 in walkway width.    Change in Gait Speed  Able to smoothly change walking speed without loss of balance or gait deviation. Deviate no more than 6 in outside of the 12 in walkway width.    Gait with Horizontal Head Turns  Performs head turns smoothly with no change in gait. Deviates no more than 6 in outside 12 in walkway width    Gait with Vertical Head Turns  Performs head turns with no change in gait. Deviates no more than 6 in outside 12 in walkway width.    Gait and Pivot Turn  Pivot turns safely within 3 sec and stops quickly with no loss of balance.    Step Over Obstacle  Is able to step over 2 stacked shoe boxes taped together (9 in total height)  without changing gait speed. No evidence of imbalance.    Gait with Narrow Base of Support  Is able to ambulate for 10 steps heel to toe with no staggering.    Gait with Eyes Closed  Walks 20 ft, slow speed, abnormal gait pattern, evidence for imbalance, deviates 10-15 in outside 12 in walkway width. Requires more than 9 sec to ambulate 20 ft.    Ambulating Backwards  Walks 20 ft, no assistive devices, good speed, no evidence for imbalance, normal gait    Steps  Alternating feet, no rail.    Total Score  28    FGA comment:  Low falls risk                   [facility] Adult PT Treatment/Exercise - 05/02/17 1445      Transfers   Transfers  Sit to Stand;Stand to Sit    Sit to Stand  6: Modified independent (Device/Increase time)    Stand to Sit  6: Modified independent (Device/Increase time)      Ambulation/Gait   Ambulation/Gait  Yes    Ambulation/Gait Assistance  6: Modified independent (Device/Increase time)    Ambulation Distance (Feet)  1136 Feet with no increase in Low  back pain     Assistive device  None    Gait Pattern  Decreased arm swing - right;Decreased dorsiflexion - right;Lateral trunk lean to left;Decreased trunk rotation    Ambulation Surface  Gravel;Grass;Indoor;Outdoor;Paved;Unlevel;Level      Therapeutic Activites    Therapeutic Activities  Lifting    Lifting  PT demo, verbal cues, tactile cues, for pattern. pt lift 10-20#box  pivot from floor to chair level and back. as well as just lifing to chest height 10 reps total( 6 with 10# 4 with 20#).       Exercises   Other Exercises   PT demo, moderate verbal and tactile cues for correct patterning of movement and to not use Lumbar spine to initiate movements during review HEP proximal hip strengthening with theraband. pt givne  Red tharaband and told to reduce intensity ( not to uses blue band yet).              PT Education - 05/02/17 1708    Education provided  Yes    Education Details  PT goals and  progression towarrds  improvement in function with reduced low back pain. Pt  goals and progression. lifting mechanics. HEP review    Person(s) Educated  Patient    Methods  Explanation;Demonstration;Tactile cues;Verbal cues    Comprehension  Verbalized understanding;Returned demonstration       PT Short Term Goals - 04/08/17 1333      PT SHORT TERM GOAL #1   Title  Same as LTGs        PT Long Term Goals - 05/02/17 1719      PT LONG TERM GOAL #1   Title  Pt will be independent with initial HEP to improve functional mobility and show home progression(ALL GOALS TARGET DATE 05/06/2017)    Status  On-going      PT LONG TERM GOAL #2   Title  Pt will perform FGA with a score of 29/30 in order to reduce falls risk     Baseline  P. MET (3/11) FGA 28/30    Status  Partially Met      PT LONG TERM GOAL #3   Title  Pt will report </=1/10 LBP/RLE pain during gardening activities to improve function during ADLs and QOL.    Baseline  On-Going (3/11)    Status  On-going      PT LONG TERM GOAL #4   Title  Pt will amb. 1000' over even/uneven terrain, while performing dynamic gait activities, with no incr. in LBP/RLE pain to improve functional mobility.     Baseline  MET(3/11) pt reports no increases in LBP or RLE during entire session including a 1000+ ft.  walk     Status  Achieved      PT LONG TERM GOAL #5   Title  Pt will demonstrate correct lifting mechanics to reduce Low back pain during activities    Status  On-going            Plan - 05/02/17 1729    Clinical Impression Statement  During today's PT session patient met 1/5 of his LTGs and partially met 1/5 of his goals. Pt as demonstrated continues to demonstrate progression towards reaching his other goals. However patient continues to demonstrate difficulty with independence with HEP. With his proximal hip strengthening exercise in particular. Pt continues to require significant verbal and tactile cueing for correct movement  patterns during exercises in order not to exacerbate his low back pain. Patient has one more session before a resubmission must be made for more sessions to continue to address issues reported above.  Patient had demonstrated significant improvement in balance completing the Functional Gait assessment with a score of 28/30 (evaluation score 23/30). Pt will continue to benefit from skilled PT intervention to continue to address the issues reported above.     Rehab Potential  Good    Clinical Impairments Affecting Rehab Potential  motivation and history of physical fitness    PT Frequency  2x / week    PT Duration  4 weeks    PT Treatment/Interventions  Gait training;Stair training;Functional mobility training;Therapeutic activities;Therapeutic exercise;Neuromuscular re-education;Patient/family education;ADLs/Self Care Home Management;Moist Heat;Manual techniques;Passive range of motion;DME Instruction;Balance training    PT Next Visit Plan  check remaining STGs and HEP program. Submit recertification as initial certification period ends 05/06/2017.  start back on track core exerciese to reudce low back pain. establish continueation of POC as needed.    Consulted and Agree with Plan of Care  Patient       Patient will benefit from skilled therapeutic intervention in order to improve  the following deficits and impairments:  Difficulty walking, Impaired UE functional use, Postural dysfunction, Pain, Decreased strength, Decreased balance, Decreased knowledge of use of DME, Abnormal gait, Impaired sensation  Visit Diagnosis: Other abnormalities of gait and mobility  Muscle weakness (generalized)  Unsteadiness on feet  Cerebrovascular accident (CVA), unspecified mechanism (HCC)  Radiculopathy, lumbar region     Problem List Patient Active Problem List   Diagnosis Date Noted  . Paresthesias 03/03/2017  . Trigeminal neuralgia of right side of face 10/01/2016  . History of mitral valve repair  09/30/2016  . Cerebrovascular accident (CVA) ([place]) 09/12/2016  . GERD (gastroesophageal reflux disease) 09/22/2015  . PCP NOTES >>>>> 11/08/2014  . Chest pain 06/07/2014  . Facial neuropathy 04/02/2013  . Anxiety 02/26/2013  . Annual physical exam 08/30/2012  . HTN (hypertension) 01/22/2011  . HYPERTROPHY PROSTATE W/O UR OBST & OTH LUTS 10/16/2008  . Hyperglycemia 10/16/2008  . Hyperlipidemia 06/15/2006  . Mitral valve disease 06/15/2006  . ALLERGIC RHINITIS 06/15/2006  . COLON CANCER, HX OF 06/15/2006   Waunita Schooner SPT 05/02/2017, 5:31 PM  Jamey Reas  PT, DPT 05/02/2017, 11:41 PM  [facility] [address] [place], [place], [address] Phone: [phone]   Fax:  [phone]  Name: Kevin Bass MRN: [id number] Date of Birth: [birth year]

## 2017-05-02 NOTE — Progress Notes (Signed)
Carelink Summary Report / Loop Recorder 

## 2017-05-03 DIAGNOSIS — L989 Disorder of the skin and subcutaneous tissue, unspecified: Secondary | ICD-10-CM | POA: Diagnosis not present

## 2017-05-03 DIAGNOSIS — L988 Other specified disorders of the skin and subcutaneous tissue: Secondary | ICD-10-CM | POA: Diagnosis not present

## 2017-05-03 DIAGNOSIS — C44329 Squamous cell carcinoma of skin of other parts of face: Secondary | ICD-10-CM | POA: Diagnosis not present

## 2017-05-03 DIAGNOSIS — D0439 Carcinoma in situ of skin of other parts of face: Secondary | ICD-10-CM | POA: Diagnosis not present

## 2017-05-05 ENCOUNTER — Ambulatory Visit: Payer: PPO

## 2017-05-05 DIAGNOSIS — R2681 Unsteadiness on feet: Secondary | ICD-10-CM | POA: Diagnosis not present

## 2017-05-05 DIAGNOSIS — R2689 Other abnormalities of gait and mobility: Secondary | ICD-10-CM

## 2017-05-05 DIAGNOSIS — M6281 Muscle weakness (generalized): Secondary | ICD-10-CM

## 2017-05-05 DIAGNOSIS — I639 Cerebral infarction, unspecified: Secondary | ICD-10-CM

## 2017-05-05 DIAGNOSIS — M5416 Radiculopathy, lumbar region: Secondary | ICD-10-CM

## 2017-05-05 NOTE — Patient Instructions (Signed)
[place]    Lie prone, face down, hands next to middle of chest. Inhale and press up torso in back arch. Keep long curve in neck, shoulders down, and buttocks engaged to protect lower back. Press up and hold for 3 seconds  They lay back down.  5 reps  2x day  BEGINNER: Keep hips on floor; straighten arms halfway.  Copyright  VHI. All rights reserved.   Copyright  VHI. All rights reserved.  Standing Arch (Extension)    Place hands in small of back. Using hands as fulcrum, arch backward. Try to keep knees straight. Great exercise if sitting makes pain worse. Use to break up long periods of sitting. Repeat __10__ times. Do __3__ sessions per day.  http://gt2.exer.us/247   Copyright  VHI. All rights reserved.

## 2017-05-05 NOTE — Therapy (Signed)
[facility] [address] [place], [place], [address] Phone: [phone]   Fax:  [phone]  Physical Therapy Treatment  Patient Details  Name: Kevin Bass MRN: [id number] Date of Birth: 1941/02/17 Referring Provider: Ellouise Newer MD   Encounter Date: 05/05/2017  PT End of Session - 05/05/17 1433    Visit Number  5    Number of Visits  8    Date for PT Re-Evaluation  05/06/17    PT Start Time  3568    PT Stop Time  1400    PT Time Calculation (min)  45 min    Activity Tolerance  Patient tolerated treatment well    Behavior During Therapy  [facility] for tasks assessed/performed       Past Medical History:  Diagnosis Date  . Allergy   . Anxiety   . Arthritis    hands  . Atrial fibrillation ([place])   . Colon cancer [facility])    s/p partial colectomy 1990, Cscope neg 7-09, next 2014  . DISORDER, MITRAL VALVE    MV recontruction at [place] 2006----still needs ABX for SBE prophylaxis   . Elevated PSA    [name]  . GERD (gastroesophageal reflux disease)   . Heart murmur    no problems since surgery  . HYPERGLYCEMIA, BORDERLINE 10/16/2008  . HYPERLIPIDEMIA 06/15/2006   diet controlled  . Hypertension   . HYPERTROPHY PROSTATE W/O UR OBST & OTH LUTS 10/16/2008  . Neuropathy   . Right groin pain    Dr. Redmond Pulling  . Stroke ([place])   . Wears partial dentures    lower partial    Past Surgical History:  Procedure Laterality Date  . COLECTOMY  1990  . COLONOSCOPY    . HERNIA REPAIR Left 2014   [facility] rep w/mesh- March 2014  . LOOP RECORDER INSERTION N/A 12/01/2016   Procedure: LOOP RECORDER INSERTION;  Surgeon: Evans Lance, MD;  Location: [place] CV LAB;  Service: Cardiovascular;  Laterality: N/A;  . MITRAL VALVE ANNULOPLASTY     [facility] 2006  . TEE WITHOUT CARDIOVERSION N/A 10/05/2016   Procedure: TRANSESOPHAGEAL ECHOCARDIOGRAM (TEE);  Surgeon: [name], MD;  Location: [facility] ENDOSCOPY;  Service: Cardiovascular;   Laterality: N/A;    There were no vitals filed for this visit.   Subjective Assessment - 05/05/17 1319    Subjective  pt reports he had a flare up of pain the day following last PT session. pt reports incr. in pins and needles sensation in R hand and R foot. pt reports pain would have come on with or without PT session. He has not been able to do HEP 2/2 hand and foot neuropathic pain.  pt reports pain and tenderness around R eye/ brow pt had a biopsy of skin above R eye. Pt reports he would like to take a break from PT to continue to try other therapies to address hand/foot numbness.     Pertinent History  Hx of 2 CVAs in 2018, HTN, mitral valve disease, facial neuropathy and trigeminal neuralgia R side of face, HLD, hx of colon CA, hyperglycemia    Limitations  Other (comment)    Patient Stated Goals  return to safe gym activites to continue to maintain fitness levels    Currently in Pain?  Yes    Pain Location  Foot    Pain Orientation  Right    Pain Descriptors / Indicators  Penetrating;Numbness;Tingling    Pain Type  Neuropathic pain  Pain Onset  1 to 4 weeks ago    Pain Frequency  Intermittent    Aggravating Factors   unknown     Pain Relieving Factors  meidcation     Effect of Pain on Daily Activities  no                      [facility] Adult PT Treatment/Exercise - 05/05/17 1326      Transfers   Transfers  Sit to Stand;Stand to Sit    Sit to Stand  6: Modified independent (Device/Increase time)    Stand to Sit  6: Modified independent (Device/Increase time)      Exercises   Other Exercises   Posterior pelvic tilt( supine, standing, seated)  SEE HEP FOR OTHER EXC.      Repeated movements in ext performed prone on elbows and in standing x30 reps total. Pt required extensive verbal and tactile cues for HEP instructions. Pt required manual facilitation to initiate correct movement.  Please see pt instructions for HEP details.       Self Care: PT Education -  05/05/17 1432    Education provided  Yes    Education Details  PT encouraged pt to obtain OT referral to have OT eval to address R hand issues. PT reviewed HEP, extension exercies, movement avoidance, LBP pain reduction Radicular pain reduction. PT encouraged pt to address RLE radicular pain as it improved with extension movements.    Person(s) Educated  Patient    Methods  Explanation    Comprehension  Verbalized understanding;Need further instruction        PT Short Term Goals - 04/08/17 1333      PT SHORT TERM GOAL #1   Title  Same as LTGs        PT Long Term Goals - 05/05/17 1434      PT LONG TERM GOAL #1   Title  Pt will be independent with initial HEP to improve functional mobility and show home progression(ALL GOALS TARGET DATE 05/06/2017)    Baseline  NOT MET- pt continue to require maximal  and verbal cues in addition to manual assistance for position for complete majority of HEP     Status  Not Met      PT LONG TERM GOAL #2   Title  Pt will perform FGA with a score of 29/30 in order to reduce falls risk     Baseline  P. MET (3/11) FGA 28/30    Status  Partially Met      PT LONG TERM GOAL #3   Title  Pt will report </=1/10 LBP/RLE pain during gardening activities to improve function during ADLs and QOL.    Baseline  On-Going (3/11)    Status  On-going      PT LONG TERM GOAL #4   Title  Pt will amb. 1000' over even/uneven terrain, while performing dynamic gait activities, with no incr. in LBP/RLE pain to improve functional mobility.     Baseline  MET(3/11) pt reports no increases in LBP or RLE during entire session including a 1000+ ft.  walk     Status  Achieved      PT LONG TERM GOAL #5   Title  Pt will demonstrate correct lifting mechanics to reduce Low back pain during activities    Status  On-going         Plan - 05/05/17 1436    Clinical Impression Statement  During today's PT session PT  encouraged pt to schedule an appointment with Occupational therapy  in order to address concerns with Right hand function and numbness. Pt was determined to terminate PT sessions based on his current focus being to focus on other means of reducing his R hand and foot numbness, although R foot symptoms were reduced with standing extension exercise. PT believes patient will continue to benefit from skilled PT and OT intervention to address LBP with radicular s/s, R hand N/T. Patient was instructed to contact PT at any time if he has an exacerbation of symptoms moving forward. Pt seems more interested in a laser neuropathy intervention he found in a magazine. Pt was educated to be cautious about information he found and to continue to do his HEP and to avoid lumabr flexion movements that have exacerbated pain in the past.     Rehab Potential  Good    Clinical Impairments Affecting Rehab Potential  motivation and history of physical fitness    PT Next Visit Plan  D/C    Consulted and Agree with Plan of Care  Patient           Patient will benefit from skilled therapeutic intervention in order to improve the following deficits and impairments:  Difficulty walking, Impaired UE functional use, Postural dysfunction, Pain, Decreased strength, Decreased balance, Decreased knowledge of use of DME, Abnormal gait, Impaired sensation  Visit Diagnosis: Other abnormalities of gait and mobility  Muscle weakness (generalized)  Unsteadiness on feet  Cerebrovascular accident (CVA), unspecified mechanism (HCC)  Radiculopathy, lumbar region     Problem List Patient Active Problem List   Diagnosis Date Noted  . Paresthesias 03/03/2017  . Trigeminal neuralgia of right side of face 10/01/2016  . History of mitral valve repair 09/30/2016  . Cerebrovascular accident (CVA) (HCC) 09/12/2016  . GERD (gastroesophageal reflux disease) 09/22/2015  . PCP NOTES >>>>> 11/08/2014  . Chest pain 06/07/2014  . Facial neuropathy 04/02/2013  . Anxiety 02/26/2013  . Annual physical  exam 08/30/2012  . HTN (hypertension) 01/22/2011  . HYPERTROPHY PROSTATE W/O UR OBST & OTH LUTS 10/16/2008  . Hyperglycemia 10/16/2008  . Hyperlipidemia 06/15/2006  . Mitral valve disease 06/15/2006  . ALLERGIC RHINITIS 06/15/2006  . COLON CANCER, HX OF 06/15/2006    PHYSICAL THERAPY DISCHARGE SUMMARY  Visits from Start of Care: 5  Current functional level related to goals / functional outcomes: See above   Remaining deficits: See above    Education / Equipment: OT services, HEP, symptom management ( [facility])  Plan: Patient agrees to discharge.  Patient goals were partially met. Patient is being discharged due to being pleased with the current functional level.  ?????       Samuel Engebose 05/05/2017, 2:43 PM  [facility] [address] [place], [place], [address] Phone: [phone]   Fax:  [phone]  Name: Isidro Ramos MRN: [id number] Date of Birth: 02/11/1941   

## 2017-05-09 ENCOUNTER — Ambulatory Visit: Payer: PPO | Admitting: Physical Therapy

## 2017-05-11 ENCOUNTER — Ambulatory Visit: Payer: PPO | Admitting: Physical Therapy

## 2017-05-16 ENCOUNTER — Ambulatory Visit: Payer: PPO | Admitting: Physical Therapy

## 2017-05-20 ENCOUNTER — Ambulatory Visit: Payer: PPO

## 2017-05-20 DIAGNOSIS — H5711 Ocular pain, right eye: Secondary | ICD-10-CM | POA: Diagnosis not present

## 2017-05-20 DIAGNOSIS — R0602 Shortness of breath: Secondary | ICD-10-CM | POA: Diagnosis not present

## 2017-05-24 ENCOUNTER — Encounter: Payer: Self-pay | Admitting: Neurology

## 2017-05-25 ENCOUNTER — Telehealth: Payer: Self-pay | Admitting: Cardiology

## 2017-05-25 NOTE — Telephone Encounter (Signed)
Patient called with some medications concerns.

## 2017-05-25 NOTE — Telephone Encounter (Signed)
Returned call to Pt.  Per Pt he has neuropathy and takes 500 mg of tylenol a day for pain.  Asking if he can take more.  Notified Pt he can take up to 3000 mg a day.  Pt indicates understanding.

## 2017-06-08 ENCOUNTER — Encounter: Payer: Self-pay | Admitting: Neurology

## 2017-06-11 LAB — CUP PACEART REMOTE DEVICE CHECK
Date Time Interrogation Session: 20190310101018
MDC IDC PG IMPLANT DT: 20181010

## 2017-06-13 ENCOUNTER — Encounter: Payer: Self-pay | Admitting: Internal Medicine

## 2017-06-14 DIAGNOSIS — D0439 Carcinoma in situ of skin of other parts of face: Secondary | ICD-10-CM | POA: Diagnosis not present

## 2017-06-17 ENCOUNTER — Encounter: Payer: Self-pay | Admitting: Internal Medicine

## 2017-06-17 ENCOUNTER — Ambulatory Visit: Payer: PPO | Admitting: Internal Medicine

## 2017-06-17 VITALS — BP 132/74 | HR 62 | Ht 70.0 in | Wt 182.0 lb

## 2017-06-17 DIAGNOSIS — R600 Localized edema: Secondary | ICD-10-CM | POA: Diagnosis not present

## 2017-06-17 DIAGNOSIS — I8393 Asymptomatic varicose veins of bilateral lower extremities: Secondary | ICD-10-CM

## 2017-06-17 DIAGNOSIS — I639 Cerebral infarction, unspecified: Secondary | ICD-10-CM | POA: Diagnosis not present

## 2017-06-17 DIAGNOSIS — I480 Paroxysmal atrial fibrillation: Secondary | ICD-10-CM | POA: Diagnosis not present

## 2017-06-17 DIAGNOSIS — I10 Essential (primary) hypertension: Secondary | ICD-10-CM | POA: Diagnosis not present

## 2017-06-17 DIAGNOSIS — E785 Hyperlipidemia, unspecified: Secondary | ICD-10-CM | POA: Diagnosis not present

## 2017-06-17 DIAGNOSIS — Z9889 Other specified postprocedural states: Secondary | ICD-10-CM | POA: Diagnosis not present

## 2017-06-17 NOTE — Progress Notes (Signed)
OFFICE CONSULT NOTE  Chief Complaint:  Follow-up  Primary Care Physician: Colon Branch, MD  HPI:  Kevin Bass is a 77 y.o. male who is being seen today for the evaluation of workup of cryptogenic stroke at the request of Colon Branch, MD. Kevin Bass is a pleasant 77 year old patient who recently had an unfortunate cryptogenic stroke. His past ocular history significant for mitral valve disease status post repair at [facility]. He is followed by cardiologist there and recently was admitted to [facility] with cryptogenic stroke. As per routine care or transesophageal echocardiogram as well as a implanted loop recorder were recommended. Unfortunately there was difficulty during the transesophageal echocardiogram procedure which involved technical difficulties with equipment and it was not able to be completed. There was some concern that the probe could not be passed adequately and he underwent upper esophageal imaging which was normal. He has had prior teas without any difficulty. After that he decided against a loop recorder and wanted to get another opinion from his cardiologist at [facility]. There was a visit there which did not indicate that a transesophageal echocardiogram would be helpful. I do not see any evidence on his prior echocardiograms of a bubble study to exclude PFO. This could be a significant finding as there is evidence now that closure of a small PFO in the setting of a patient whose had a cryptogenic stroke is guideline indicated. It's also possible that he could have atrial fibrillation, especially given his history of mitral valve disease and valve repair which is a significant risk factor for arrhythmias.  12/21/2016  Kevin Bass was seen today in follow-up.  He underwent a repeat echocardiogram which did not demonstrate any LV thrombus or evidence for atrial septal defect.  Subsequently he had placement of an implanted loop recorder by Dr. Lovena Le which was seen in  follow-up and appears to be well-healed.  There is no evidence today of any significant arrhythmias such as atrial fibrillation.  He is currently on aspirin and Plavix for stroke prevention.  He is also had increasing doses of medication to control hypertension.  Currently his blood pressure is well controlled at 108/70.  He is appropriately on rosuvastatin, ezetimibe and fish oil with an LDL-C is 60 approximately 3 weeks ago.  06/17/2017  Kevin Bass returns today for follow-up.  Overall he says he feels fairly well.  He denies any significant palpitations or recurrent atrial fibrillation.  EKG today shows normal sinus rhythm at 62.  He has been working with Dr. [name] for neuropathy.  Is currently on very high dose of gabapentin (a total of 1500 mg daily).  He is tolerating apixaban without any bleeding difficulty and cholesterol has been well controlled on rosuvastatin and ezetimibe.  Only, today he is complaining of some lower extremity discomfort and minimal edema.  He says this is worse on long car rides.  He does have notable bilateral lower extremity varicosities, and would likely benefit from compression stockings.  PMHx:  Past Medical History:  Diagnosis Date  . Allergy   . Anxiety   . Arthritis    hands  . Atrial fibrillation ([place])   . Colon cancer [facility])    s/p partial colectomy 1990, Cscope neg 7-09, next 2014  . DISORDER, MITRAL VALVE    MV recontruction at [place] 2006----still needs ABX for SBE prophylaxis   . Elevated PSA    [name]  . GERD (gastroesophageal reflux disease)   . Heart murmur    no  problems since surgery  . HYPERGLYCEMIA, BORDERLINE 10/16/2008  . HYPERLIPIDEMIA 06/15/2006   diet controlled  . Hypertension   . HYPERTROPHY PROSTATE W/O UR OBST & OTH LUTS 10/16/2008  . Neuropathy   . Right groin pain    Dr. Redmond Pulling  . Stroke ([place])   . Wears partial dentures    lower partial    Past Surgical History:  Procedure Laterality Date  . COLECTOMY  1990  .  COLONOSCOPY    . HERNIA REPAIR Left 2014   [facility] rep w/mesh- March 2014  . LOOP RECORDER INSERTION N/A 12/01/2016   Procedure: LOOP RECORDER INSERTION;  Surgeon: Evans Lance, MD;  Location: [place] CV LAB;  Service: Cardiovascular;  Laterality: N/A;  . MITRAL VALVE ANNULOPLASTY     [facility] 2006  . TEE WITHOUT CARDIOVERSION N/A 10/05/2016   Procedure: TRANSESOPHAGEAL ECHOCARDIOGRAM (TEE);  Surgeon: [name], MD;  Location: [facility] ENDOSCOPY;  Service: Cardiovascular;  Laterality: N/A;    FAMHx:  Family History  Problem Relation Age of Onset  . Colon cancer Other        M side (cousins)  . Diabetes Neg Hx   . Stroke Neg Hx   . CAD Neg Hx   . Prostate cancer Neg Hx   . Rectal cancer Neg Hx   . Esophageal cancer Neg Hx     SOCHx:   reports that he quit smoking about 34 years ago. His smoking use included cigarettes. He has a 10.00 pack-year smoking history. He has never used smokeless tobacco. He reports that he drinks about 4.2 oz of alcohol per week. He reports that he does not use drugs.  ALLERGIES:  Allergies  Allergen Reactions  . Statins Other (See Comments)    Myalgias (intolerance)    ROS: Pertinent items noted in HPI and remainder of comprehensive ROS otherwise negative.  HOME MEDS: Current Outpatient Medications on File Prior to Visit  Medication Sig Dispense Refill  . ALPRAZolam (XANAX) 0.25 MG tablet Take 1-2 tablets (0.25-0.5 mg total) by mouth 2 (two) times daily as needed for anxiety. 40 tablet 1  . apixaban (ELIQUIS) 5 MG TABS tablet Take 1 tablet (5 mg total) by mouth 2 (two) times daily. 60 tablet 5  . Arginine 500 MG CAPS Take 1,500 mg by mouth daily.    Marland Kitchen ezetimibe (ZETIA) 10 MG tablet Take 1 tablet (10 mg total) by mouth daily. 90 tablet 1  . gabapentin (NEURONTIN) 300 MG capsule Take 1 cap in AM, 1 cap at dinner, 2 caps at bedtime (Patient taking differently: Take 2 cap in AM AND 3 caps at bedtime) 120 capsule 5  . Glucosamine-MSM-Hyaluronic Acd  (JOINT HEALTH PO) Take 1 tablet by mouth daily. JARROW JOINT BUILDER    . loratadine (CLARITIN) 10 MG tablet Take 10 mg by mouth at bedtime.     Marland Kitchen losartan (COZAAR) 25 MG tablet Take 1 tablet (25 mg total) by mouth 2 (two) times daily. 60 tablet 6  . LYCOPENE PO Take 1 capsule by mouth daily. [place] FORMULA LYCO-SORB PROSTATE SUPPORT    . Multiple Vitamins-Minerals (OCUVITE PRESERVISION PO) Take 1 tablet by mouth 2 (two) times daily.     . Omega-3 Fatty Acids (FISH OIL PO) Take 1,000 mg by mouth daily.     Marland Kitchen omeprazole (PRILOSEC) 40 MG capsule Take 1 capsule (40 mg total) by mouth daily. (Patient taking differently: Take 40 mg by mouth daily as needed (for acid reflux). ) 90 capsule 3  .  Red Yeast Rice Extract (RED YEAST RICE PO) Take 1 tablet by mouth daily. MORNING & AFTERNOON    . rosuvastatin (CRESTOR) 20 MG tablet Take 1 tablet (20 mg total) at bedtime by mouth. 30 tablet 5   No current facility-administered medications on file prior to visit.     LABS/IMAGING: No results found for this or any previous visit (from the past 48 hour(s)). No results found.  LIPID PANEL:    Component Value Date/Time   CHOL 119 11/30/2016 1059   TRIG 76 11/30/2016 1059   HDL 44 11/30/2016 1059   CHOLHDL 2.7 11/30/2016 1059   VLDL 15 11/30/2016 1059   LDLCALC 60 11/30/2016 1059   LDLDIRECT 149.1 08/30/2012 1352    WEIGHTS: Wt Readings from Last 3 Encounters:  06/17/17 182 lb (82.6 kg)  04/28/17 175 lb (79.4 kg)  04/14/17 181 lb 2 oz (82.2 kg)    VITALS: BP 132/74 (BP Location: Left Arm, Patient Position: Sitting, Cuff Size: Normal)   Pulse 62   Ht 5\' 10"  (1.778 m)   Wt 182 lb (82.6 kg)   BMI 26.11 kg/m   EXAM: General appearance: alert and no distress Neck: no carotid bruit, no JVD and thyroid not enlarged, symmetric, no tenderness/mass/nodules Lungs: clear to auscultation bilaterally Heart: regular rate and rhythm, S1, S2 normal, no murmur, click, rub or gallop Abdomen: soft,  non-tender; bowel sounds normal; no masses,  no organomegaly Extremities: extremities normal, atraumatic, no cyanosis or edema and varicose veins noted Pulses: 2+ and symmetric Skin: Skin color, texture, turgor normal. No rashes or lesions Neurologic: Grossly normal Psych: Pleasant  EKG: Sinus rhythm at 62-personally reviewed  ASSESSMENT: 1. Cryptogenic stroke 2. Status post mitral valve repair 3. A-fib - S/p ILR 4. CHADSVASC Score of 4 - on Eliquis 5. Essential hypertension 6. Dyslipidemia  PLAN: 1.   Kevin Bass is to do well.  He was found to have atrial fibrillation on implanted loop recorder and is appropriately anticoagulated on Eliquis.  He has had no further episodes of cryptogenic stroke.  Blood pressure is at goal cholesterol is better managed now on rosuvastatin and ezetimibe.  His recent LDL in October 2018 was 60.  Follow-up with me annually or sooner as necessary.  [name], MD, [place] Director of the [facility]  [facility] Attending Cardiologist  Direct Dial: [phone]  Fax: [phone]  Website:  [email or web address] 06/17/2017, 12:20 PM

## 2017-06-17 NOTE — Patient Instructions (Addendum)
Your physician wants you to follow-up in: 6 months with Dr. Debara Pickett. You will receive a reminder letter in the mail two months in advance. If you don't receive a letter, please call our office to schedule the follow-up appointment.  Dr. Debara Pickett recommends KNEE HIGH COMPRESSION STOCKINGS  -- 20-30 mmHg (compression strength) -- [facility]  -- [address]  -- [place]   -- [place]  -- [address] [place]  -- [phone]   How to Use Compression Stockings Compression stockings are elastic socks that squeeze the legs. They help to increase blood flow to the legs, decrease swelling in the legs, and reduce the chance of developing blood clots in the lower legs. Compression stockings are often used by people who:  Are recovering from surgery.  Have poor circulation in their legs.  Are prone to getting blood clots in their legs.  Have varicose veins.  Sit or stay in bed for long periods of time. How to use compression stockings Before you put on your compression stockings:  Make sure that they are the correct size. If you do not know your size, ask your health care provider.  Make sure that they are clean, dry, and in good condition.  Check them for rips and tears. Do not put them on if they are ripped or torn. Put your stockings on first thing in the morning, before you get out of bed. Keep them on for as long as your health care provider advises. When you are wearing your stockings:  Keep them as smooth as possible. Do not allow them to bunch up. It is especially important to prevent the stockings from bunching up around your toes or behind your knees.  Do not roll the stockings downward and leave them rolled down. This can decrease blood flow to your leg.  Change them right away if they become wet or dirty. When you take off your  stockings, inspect your legs and feet. Anything that does not seem normal may require medical attention. Look for:  Open sores.  Red spots.  Swelling. Information and tips  Do not stop wearing your compression stockings without talking to your health care provider first.  Wash your stockings every day with mild detergent in cold or warm water. Do not use bleach. Air-dry your stockings or dry them in a clothes dryer on low heat.  Replace your stockings every 3-6 months.  If skin moisturizing is part of your treatment plan, apply lotion or cream at night so that your skin will be dry when you put on the stockings in the morning. It is harder to put the stockings on when you have lotion on your legs or feet. Contact a health care provider if: Remove your stockings and seek medical care if:  You have a feeling of pins and needles in your feet or legs.  You have any new changes in your skin.  You have skin lesions that are getting worse.  You have swelling or pain that is getting worse. Get help right away if:  You have numbness or tingling in your lower legs that does not get better right after you take the stockings off.  Your toes or feet become cold and blue.  You develop open sores or red spots on your legs that do not go away.  You see or feel a warm spot on your  leg.  You have new swelling or soreness in your leg.  You are short of breath or you have chest pain for no reason.  You have a rapid or irregular heartbeat.  You feel light-headed or dizzy. This information is not intended to replace advice given to you by your health care provider. Make sure you discuss any questions you have with your health care provider. Document Released: 12/06/2008 Document Revised: 07/09/2015 Document Reviewed: 01/16/2014 Elsevier Interactive Patient Education  2017 [employer].

## 2017-06-19 ENCOUNTER — Encounter: Payer: Self-pay | Admitting: Internal Medicine

## 2017-06-19 DIAGNOSIS — I8393 Asymptomatic varicose veins of bilateral lower extremities: Secondary | ICD-10-CM | POA: Insufficient documentation

## 2017-06-19 DIAGNOSIS — R600 Localized edema: Secondary | ICD-10-CM | POA: Insufficient documentation

## 2017-06-21 DIAGNOSIS — D0439 Carcinoma in situ of skin of other parts of face: Secondary | ICD-10-CM | POA: Diagnosis not present

## 2017-06-22 DIAGNOSIS — C4492 Squamous cell carcinoma of skin, unspecified: Secondary | ICD-10-CM | POA: Insufficient documentation

## 2017-06-22 HISTORY — DX: Squamous cell carcinoma of skin, unspecified: C44.92

## 2017-06-28 DIAGNOSIS — Z08 Encounter for follow-up examination after completed treatment for malignant neoplasm: Secondary | ICD-10-CM | POA: Diagnosis not present

## 2017-06-28 DIAGNOSIS — L570 Actinic keratosis: Secondary | ICD-10-CM | POA: Diagnosis not present

## 2017-06-29 ENCOUNTER — Other Ambulatory Visit: Payer: Self-pay | Admitting: Internal Medicine

## 2017-07-01 ENCOUNTER — Other Ambulatory Visit: Payer: Self-pay

## 2017-07-01 ENCOUNTER — Telehealth: Payer: Self-pay | Admitting: Neurology

## 2017-07-01 DIAGNOSIS — M1811 Unilateral primary osteoarthritis of first carpometacarpal joint, right hand: Secondary | ICD-10-CM | POA: Diagnosis not present

## 2017-07-01 MED ORDER — GABAPENTIN 300 MG PO CAPS: ORAL_CAPSULE | ORAL | 5 refills | Status: DC

## 2017-07-01 NOTE — Telephone Encounter (Signed)
Rx has been corrected and sent to [place] [facility]

## 2017-07-01 NOTE — Telephone Encounter (Signed)
Pt called and said his gabapentin did not get changed from 3 a day to 5 a day at [facility] and needs to have that done

## 2017-07-05 ENCOUNTER — Emergency Department (HOSPITAL_COMMUNITY): Payer: PPO

## 2017-07-05 ENCOUNTER — Emergency Department (HOSPITAL_COMMUNITY)
Admission: EM | Admit: 2017-07-05 | Discharge: 2017-07-05 | Disposition: A | Discharge status: Home / Self Care | Payer: PPO | Attending: Emergency Medicine | Admitting: Emergency Medicine

## 2017-07-05 ENCOUNTER — Telehealth: Payer: Self-pay | Admitting: Neurology

## 2017-07-05 ENCOUNTER — Encounter (HOSPITAL_COMMUNITY): Payer: Self-pay

## 2017-07-05 ENCOUNTER — Other Ambulatory Visit: Payer: Self-pay

## 2017-07-05 DIAGNOSIS — R42 Dizziness and giddiness: Secondary | ICD-10-CM | POA: Diagnosis not present

## 2017-07-05 DIAGNOSIS — I480 Paroxysmal atrial fibrillation: Secondary | ICD-10-CM | POA: Diagnosis not present

## 2017-07-05 DIAGNOSIS — I10 Essential (primary) hypertension: Secondary | ICD-10-CM | POA: Insufficient documentation

## 2017-07-05 DIAGNOSIS — R002 Palpitations: Secondary | ICD-10-CM | POA: Diagnosis not present

## 2017-07-05 DIAGNOSIS — Z87891 Personal history of nicotine dependence: Secondary | ICD-10-CM | POA: Insufficient documentation

## 2017-07-05 DIAGNOSIS — Z79899 Other long term (current) drug therapy: Secondary | ICD-10-CM | POA: Diagnosis not present

## 2017-07-05 DIAGNOSIS — R000 Tachycardia, unspecified: Secondary | ICD-10-CM | POA: Diagnosis not present

## 2017-07-05 LAB — BASIC METABOLIC PANEL
ANION GAP: 11 (ref 5–15)
BUN: 16 mg/dL (ref 6–20)
CALCIUM: 9.5 mg/dL (ref 8.9–10.3)
CO2: 29 mmol/L (ref 22–32)
Chloride: 102 mmol/L (ref 101–111)
Creatinine, Ser: 1.05 mg/dL (ref 0.61–1.24)
GFR calc Af Amer: >60 mL/min (ref >60)
GLUCOSE: 120 mg/dL — AB (ref 65–99)
Potassium: 3.9 mmol/L (ref 3.5–5.1)
Sodium: 142 mmol/L (ref 135–145)

## 2017-07-05 LAB — MAGNESIUM: Magnesium: 2 mg/dL (ref 1.7–2.4)

## 2017-07-05 LAB — CBC
HCT: 35.5 % — ABNORMAL LOW (ref 39.0–52.0)
HEMOGLOBIN: 11.6 g/dL — AB (ref 13.0–17.0)
MCH: 27.8 pg (ref 26.0–34.0)
MCHC: 32.7 g/dL (ref 30.0–36.0)
MCV: 84.9 fL (ref 78.0–100.0)
Platelets: 152 10*3/uL (ref 150–400)
RBC: 4.18 MIL/uL — ABNORMAL LOW (ref 4.22–5.81)
RDW: 12.8 % (ref 11.5–15.5)
WBC: 5.9 10*3/uL (ref 4.0–10.5)

## 2017-07-05 MED ORDER — METOPROLOL TARTRATE 5 MG/5ML IV SOLN
5.0000 mg | Freq: Once | INTRAVENOUS | Status: AC
  Administered 2017-07-05: 5 mg via INTRAVENOUS
  Filled 2017-07-05: qty 5

## 2017-07-05 MED ORDER — METOPROLOL SUCCINATE ER 25 MG PO TB24: 25.0000 mg | ORAL_TABLET | Freq: Every day | ORAL | 0 refills | Status: DC

## 2017-07-05 NOTE — ED Notes (Signed)
Patient transported to X-ray 

## 2017-07-05 NOTE — Discharge Instructions (Addendum)
You were in atrial fibrillation tonight. Your heart converted back to its reguar rhythm. New Rx for Metoprolol to prevent rapid heart rate. Call your cardiologist for recheck.

## 2017-07-05 NOTE — ED Triage Notes (Signed)
Pt BIB [facility] for eval of palpitations and generalized weakness. Pt reports he noted palpitations onset at home this afternoon, pt has known hx of afib. EMS reports max rate 200 BPM, rate controlled around 1teens on arrival. Pt A&Ox4, GCS15 on arrival

## 2017-07-05 NOTE — Telephone Encounter (Signed)
Pt made aware

## 2017-07-05 NOTE — Telephone Encounter (Signed)
Spoke with pharmacy.  They state that insurance will not pay for pt's gabapentin until May 23rd.  According to insurance, it is too soon to pick up - even with increase in dose.  Pharmacist states that she is [employer] today.

## 2017-07-05 NOTE — ED Notes (Signed)
Discharge instructions and prescriptions discussed with Pt. Pt verbalized understanding. Pt stable and ambulatory.   

## 2017-07-05 NOTE — Telephone Encounter (Signed)
Patient called needing to check on his prescription Gabapentin that was sent to [facility]. They are telling him they do not have it. Please Call. Thanks

## 2017-07-06 ENCOUNTER — Telehealth: Payer: Self-pay | Admitting: Internal Medicine

## 2017-07-06 NOTE — Telephone Encounter (Signed)
Spoke to patient .patient states went to emergency room last evening. Needs follow up appointment - Patient started on metoprolol succinate 25 mg daily.  Instruction given to take medication. Appointment schedule for 07/11/17 at 3 pm with Dr Debara Pickett. Patient verbalized understanding.

## 2017-07-11 ENCOUNTER — Ambulatory Visit (INDEPENDENT_AMBULATORY_CARE_PROVIDER_SITE_OTHER): Payer: PPO | Admitting: Internal Medicine

## 2017-07-11 ENCOUNTER — Encounter: Payer: Self-pay | Admitting: Internal Medicine

## 2017-07-11 VITALS — BP 115/70 | HR 60 | Ht 70.0 in | Wt 182.4 lb

## 2017-07-11 DIAGNOSIS — I480 Paroxysmal atrial fibrillation: Secondary | ICD-10-CM | POA: Diagnosis not present

## 2017-07-11 DIAGNOSIS — Z9889 Other specified postprocedural states: Secondary | ICD-10-CM | POA: Diagnosis not present

## 2017-07-11 NOTE — ED Provider Notes (Signed)
[facility] EMERGENCY DEPARTMENT Provider Note   CSN: [id number] Arrival date & time: 07/05/17  2015     History   Chief Complaint Chief Complaint  Patient presents with  . Atrial Fibrillation    HPI Kevin Bass is a 77 y.o. male.  Complaint is palpitations, history of atrial fibrillation.  HPI is a 77 year old male.  He has a history of paroxysmal atrial fibrillation.  He is on anticoagulations.  He is never required rate control.  He is followed with Dr. Debara Pickett at cardiology.  Had an episode of feeling with some tachypalpitations tonight.  Upon arrival of paramedics he was in A. fib at a rate of 190 with RVR.  This slowed in route and converted upon his arrival.  Currently asymptomatic.  Past Medical History:  Diagnosis Date  . Allergy   . Anxiety   . Arthritis    hands  . Atrial fibrillation ([place])   . Colon cancer [facility])    s/p partial colectomy 1990, Cscope neg 7-09, next 2014  . DISORDER, MITRAL VALVE    MV recontruction at [place] 2006----still needs ABX for SBE prophylaxis   . Elevated PSA    [name]  . GERD (gastroesophageal reflux disease)   . Heart murmur    no problems since surgery  . HYPERGLYCEMIA, BORDERLINE 10/16/2008  . HYPERLIPIDEMIA 06/15/2006   diet controlled  . Hypertension   . HYPERTROPHY PROSTATE W/O UR OBST & OTH LUTS 10/16/2008  . Neuropathy   . Right groin pain    Dr. Redmond Pulling  . Stroke ([place])   . Wears partial dentures    lower partial    Patient Active Problem List   Diagnosis Date Noted  . Varicose veins of both lower extremities 06/19/2017  . Bilateral lower extremity edema 06/19/2017  . Paresthesias 03/03/2017  . Trigeminal neuralgia of right side of face 10/01/2016  . History of mitral valve repair 09/30/2016  . Cerebrovascular accident (CVA) ([place]) 09/12/2016  . GERD (gastroesophageal reflux disease) 09/22/2015  . PCP NOTES >>>>> 11/08/2014  . Chest pain 06/07/2014  . Facial neuropathy 04/02/2013  .  Anxiety 02/26/2013  . Annual physical exam 08/30/2012  . HTN (hypertension) 01/22/2011  . HYPERTROPHY PROSTATE W/O UR OBST & OTH LUTS 10/16/2008  . Hyperglycemia 10/16/2008  . Hyperlipidemia 06/15/2006  . Mitral valve disease 06/15/2006  . ALLERGIC RHINITIS 06/15/2006  . COLON CANCER, HX OF 06/15/2006    Past Surgical History:  Procedure Laterality Date  . COLECTOMY  1990  . COLONOSCOPY    . HERNIA REPAIR Left 2014   [facility] rep w/mesh- March 2014  . LOOP RECORDER INSERTION N/A 12/01/2016   Procedure: LOOP RECORDER INSERTION;  Surgeon: Evans Lance, MD;  Location: [place] CV LAB;  Service: Cardiovascular;  Laterality: N/A;  . MITRAL VALVE ANNULOPLASTY     [facility] 2006  . TEE WITHOUT CARDIOVERSION N/A 10/05/2016   Procedure: TRANSESOPHAGEAL ECHOCARDIOGRAM (TEE);  Surgeon: [name], MD;  Location: [facility] ENDOSCOPY;  Service: Cardiovascular;  Laterality: N/A;        Home Medications    Prior to Admission medications   Medication Sig Start Date End Date Taking? Authorizing Provider  ALPRAZolam (XANAX) 0.25 MG tablet Take 1-2 tablets (0.25-0.5 mg total) by mouth 2 (two) times daily as needed for anxiety. 11/03/16  Yes [name], Alda Berthold, MD  Arginine 500 MG CAPS Take 1,500 mg by mouth daily.   Yes [provider]  ELIQUIS 5 MG TABS tablet TAKE ONE TABLET  BY MOUTH TWICE DAILY  06/29/17  Yes Hilty, Nadean Corwin, MD  ezetimibe (ZETIA) 10 MG tablet Take 1 tablet (10 mg total) by mouth daily. 03/07/17  Yes Colon Branch, MD  gabapentin (NEURONTIN) 300 MG capsule Take 2 cap in AM AND 3 caps at bedtime 07/01/17  Yes Cameron Sprang, MD  Glucosamine-MSM-Hyaluronic Acd (JOINT HEALTH PO) Take 1 tablet by mouth daily. [place]   Yes [provider]  loratadine (CLARITIN) 10 MG tablet Take 10 mg by mouth at bedtime.    Yes [provider]  losartan (COZAAR) 25 MG tablet Take 1 tablet (25 mg total) by mouth 2 (two) times daily. 01/27/17  Yes [name], Alda Berthold, MD  LYCOPENE PO  Take 1 capsule by mouth 2 (two) times daily. [place] FORMULA LYCO-SORB PROSTATE SUPPORT    Yes [provider]  Multiple Vitamins-Minerals (OCUVITE PRESERVISION PO) Take 1 tablet by mouth 2 (two) times daily.    Yes [provider]  Omega-3 Fatty Acids (FISH OIL PO) Take 1,000 mg by mouth daily.    Yes [provider]  metoprolol succinate (TOPROL-XL) 25 MG 24 hr tablet Take 1 tablet (25 mg total) by mouth daily. 07/05/17   Tanna Furry, MD  omeprazole (PRILOSEC) 40 MG capsule Take 1 capsule (40 mg total) by mouth daily. 07/06/16   Colon Branch, MD  rosuvastatin (CRESTOR) 20 MG tablet Take 1 tablet (20 mg total) at bedtime by mouth. 12/30/16   Colon Branch, MD    Family History Family History  Problem Relation Age of Onset  . Colon cancer Other        M side (cousins)  . Diabetes Neg Hx   . Stroke Neg Hx   . CAD Neg Hx   . Prostate cancer Neg Hx   . Rectal cancer Neg Hx   . Esophageal cancer Neg Hx     Social History Social History   Tobacco Use  . Smoking status: Former Smoker    Packs/day: 0.50    Years: 20.00    Pack years: 10.00    Types: Cigarettes    Last attempt to quit: 02/23/1983    Years since quitting: 34.4  . Smokeless tobacco: Never Used  Substance Use Topics  . Alcohol use: Yes    Alcohol/week: 4.2 oz    Types: 7 Glasses of wine per week  . Drug use: No     Allergies   Statins   Review of Systems Review of Systems  Constitutional: Negative for appetite change, chills, diaphoresis, fatigue and fever.  HENT: Negative for mouth sores, sore throat and trouble swallowing.   Eyes: Negative for visual disturbance.  Respiratory: Negative for cough, chest tightness, shortness of breath and wheezing.   Cardiovascular: Positive for palpitations. Negative for chest pain.  Gastrointestinal: Negative for abdominal distention, abdominal pain, diarrhea, nausea and vomiting.  Endocrine: Negative for polydipsia, polyphagia and polyuria.    Genitourinary: Negative for dysuria, frequency and hematuria.  Musculoskeletal: Negative for gait problem.  Skin: Negative for color change, pallor and rash.  Neurological: Negative for dizziness, syncope, light-headedness and headaches.  Hematological: Does not bruise/bleed easily.  Psychiatric/Behavioral: Negative for behavioral problems and confusion.     Physical Exam Updated Vital Signs BP 122/74   Pulse 80   Temp 98.8 F (37.1 C) (Oral)   Resp (!) 25   Ht 5\' 10"  (1.778 m)   Wt 82.6 kg (182 lb)   SpO2 95%   BMI 26.11  kg/m   Physical Exam  Constitutional: He is oriented to person, place, and time. He appears well-developed and well-nourished. No distress.  HENT:  Head: Normocephalic.  Eyes: Pupils are equal, round, and reactive to light. Conjunctivae are normal. No scleral icterus.  Neck: Normal range of motion. Neck supple. No thyromegaly present.  Cardiovascular: Normal rate and regular rhythm. Exam reveals no gallop and no friction rub.  No murmur heard. Pulmonary/Chest: Effort normal and breath sounds normal. No respiratory distress. He has no wheezes. He has no rales.  Abdominal: Soft. Bowel sounds are normal. He exhibits no distension. There is no tenderness. There is no rebound.  Musculoskeletal: Normal range of motion.  Neurological: He is alert and oriented to person, place, and time.  Skin: Skin is warm and dry. No rash noted.  Psychiatric: He has a normal mood and affect. His behavior is normal.     ED Treatments / Results  Labs (all labs ordered are listed, but only abnormal results are displayed) Labs Reviewed  BASIC METABOLIC PANEL - Abnormal; Notable for the following components:      Result Value   Glucose, Bld 120 (*)    All other components within normal limits  CBC - Abnormal; Notable for the following components:   RBC 4.18 (*)    Hemoglobin 11.6 (*)    HCT 35.5 (*)    All other components within normal limits  MAGNESIUM    EKG EKG  Interpretation  Date/Time:  Tuesday Jul 05 2017 21:51:03 EDT Ventricular Rate:  81 PR Interval:    QRS Duration: 92 QT Interval:  350 QTC Calculation: 407 R Axis:   -41 Text Interpretation:  Sinus rhythm Atrial premature complex Inferior infarct, old Consider anterior infarct Confirmed by Virgel Manifold [id number]) on 07/06/2017 10:52:07 AM   Radiology No results found.  Procedures Procedures (including critical care time)  Medications Ordered in ED Medications  metoprolol tartrate (LOPRESSOR) injection 5 mg (5 mg Intravenous Given 07/05/17 2049)     Initial Impression / Assessment and Plan / ED Course  I have reviewed the triage vital signs and the nursing notes.  Pertinent labs & imaging results that were available during my care of the patient were reviewed by me and considered in my medical decision making (see chart for details).     EKG Interpretation  Date/Time:  Tuesday Jul 05 2017 21:51:03 EDT Ventricular Rate:  81 PR Interval:    QRS Duration: 92 QT Interval:  350 QTC Calculation: 407 R Axis:   -41 Text Interpretation:  Sinus rhythm Atrial premature complex Inferior infarct, old Consider anterior infarct Confirmed by Virgel Manifold [phone]) on 07/06/2017 10:52:07 AM  Given a dose of Lopressor.  Reassuring labs.  Asymptomatic here.  No concern for ACS or CHF.  Plan will be low-dose daily Toprol to prevent RVR.  Follow-up with his cardiologist.  Return with recurrence.  Final Clinical Impressions(s) / ED Diagnoses   Final diagnoses:  Paroxysmal atrial fibrillation [facility])    ED Discharge Orders        Ordered    metoprolol succinate (TOPROL-XL) 25 MG 24 hr tablet  Daily     07/05/17 2218       Tanna Furry, MD 07/11/17 1526

## 2017-07-11 NOTE — Progress Notes (Signed)
OFFICE CONSULT NOTE  Chief Complaint:  Follow-up A. fib  Primary Care Physician: Colon Branch, MD  HPI:  Kevin Bass is a 77 y.o. male who is being seen today for the evaluation of workup of cryptogenic stroke at the request of Colon Branch, MD. Kevin Bass is a pleasant 77 year old patient who recently had an unfortunate cryptogenic stroke. His past ocular history significant for mitral valve disease status post repair at [facility]. He is followed by cardiologist there and recently was admitted to [facility] with cryptogenic stroke. As per routine care or transesophageal echocardiogram as well as a implanted loop recorder were recommended. Unfortunately there was difficulty during the transesophageal echocardiogram procedure which involved technical difficulties with equipment and it was not able to be completed. There was some concern that the probe could not be passed adequately and he underwent upper esophageal imaging which was normal. He has had prior teas without any difficulty. After that he decided against a loop recorder and wanted to get another opinion from his cardiologist at [facility]. There was a visit there which did not indicate that a transesophageal echocardiogram would be helpful. I do not see any evidence on his prior echocardiograms of a bubble study to exclude PFO. This could be a significant finding as there is evidence now that closure of a small PFO in the setting of a patient whose had a cryptogenic stroke is guideline indicated. It's also possible that he could have atrial fibrillation, especially given his history of mitral valve disease and valve repair which is a significant risk factor for arrhythmias.  12/21/2016  Kevin Bass was seen today in follow-up.  He underwent a repeat echocardiogram which did not demonstrate any LV thrombus or evidence for atrial septal defect.  Subsequently he had placement of an implanted loop recorder by Dr. Lovena Le which was seen in  follow-up and appears to be well-healed.  There is no evidence today of any significant arrhythmias such as atrial fibrillation.  He is currently on aspirin and Plavix for stroke prevention.  He is also had increasing doses of medication to control hypertension.  Currently his blood pressure is well controlled at 108/70.  He is appropriately on rosuvastatin, ezetimibe and fish oil with an LDL-C is 60 approximately 3 weeks ago.  06/17/2017  Kevin Bass returns today for follow-up.  Overall he says he feels fairly well.  He denies any significant palpitations or recurrent atrial fibrillation.  EKG today shows normal sinus rhythm at 62.  He has been working with Dr. [name] for neuropathy.  Is currently on very high dose of gabapentin (a total of 1500 mg daily).  He is tolerating apixaban without any bleeding difficulty and cholesterol has been well controlled on rosuvastatin and ezetimibe.  Only, today he is complaining of some lower extremity discomfort and minimal edema.  He says this is worse on long car rides.  He does have notable bilateral lower extremity varicosities, and would likely benefit from compression stockings.  07/11/2017  Kevin Bass was seen today in follow-up.  I last saw him about a month ago.  He was doing well without any symptomatic A. fib.  His A. fib was detected by implanted loop recorder.  He has been on anticoagulation.  Most recently he was in the emergency department for symptomatic atrial fibrillation with heart rate as high as 200 when he was seen by EMS.  He was treated and seen in the emergency department.  He was started on metoprolol and  converted back to sinus rhythm.  He had questions about the value of the implanted loop recorder, however I reminded him that his job was not to notify you of arrhythmias as they were happening.    PMHx:  Past Medical History:  Diagnosis Date  . Allergy   . Anxiety   . Arthritis    hands  . Atrial fibrillation ([place])   . Colon  cancer [facility])    s/p partial colectomy 1990, Cscope neg 7-09, next 2014  . DISORDER, MITRAL VALVE    MV recontruction at [facility] 2006----still needs ABX for SBE prophylaxis   . Elevated PSA    [name]  . GERD (gastroesophageal reflux disease)   . Heart murmur    no problems since surgery  . HYPERGLYCEMIA, BORDERLINE 10/16/2008  . HYPERLIPIDEMIA 06/15/2006   diet controlled  . Hypertension   . HYPERTROPHY PROSTATE W/O UR OBST & OTH LUTS 10/16/2008  . Neuropathy   . Right groin pain    Dr. Redmond Pulling  . Stroke ([place])   . Wears partial dentures    lower partial    Past Surgical History:  Procedure Laterality Date  . COLECTOMY  1990  . COLONOSCOPY    . HERNIA REPAIR Left 2014   [facility] rep w/mesh- March 2014  . LOOP RECORDER INSERTION N/A 12/01/2016   Procedure: LOOP RECORDER INSERTION;  Surgeon: Evans Lance, MD;  Location: [facility] CV LAB;  Service: Cardiovascular;  Laterality: N/A;  . MITRAL VALVE ANNULOPLASTY     [facility] 2006  . TEE WITHOUT CARDIOVERSION N/A 10/05/2016   Procedure: TRANSESOPHAGEAL ECHOCARDIOGRAM (TEE);  Surgeon: [name], MD;  Location: [facility] ENDOSCOPY;  Service: Cardiovascular;  Laterality: N/A;    FAMHx:  Family History  Problem Relation Age of Onset  . Colon cancer Other        M side (cousins)  . Diabetes Neg Hx   . Stroke Neg Hx   . CAD Neg Hx   . Prostate cancer Neg Hx   . Rectal cancer Neg Hx   . Esophageal cancer Neg Hx     SOCHx:   reports that he quit smoking about 34 years ago. His smoking use included cigarettes. He has a 10.00 pack-year smoking history. He has never used smokeless tobacco. He reports that he drinks about 4.2 oz of alcohol per week. He reports that he does not use drugs.  ALLERGIES:  Allergies  Allergen Reactions  . Statins Other (See Comments)    Myalgias (intolerance)    ROS: Pertinent items noted in HPI and remainder of comprehensive ROS otherwise negative.  HOME MEDS: Current Outpatient Medications on File Prior  to Visit  Medication Sig Dispense Refill  . ALPRAZolam (XANAX) 0.25 MG tablet Take 1-2 tablets (0.25-0.5 mg total) by mouth 2 (two) times daily as needed for anxiety. 40 tablet 1  . Arginine 500 MG CAPS Take 1,500 mg by mouth daily.    Marland Kitchen ELIQUIS 5 MG TABS tablet TAKE ONE TABLET BY MOUTH TWICE DAILY  180 tablet 1  . ezetimibe (ZETIA) 10 MG tablet Take 1 tablet (10 mg total) by mouth daily. 90 tablet 1  . gabapentin (NEURONTIN) 300 MG capsule Take 2 cap in AM AND 3 caps at bedtime 150 capsule 5  . Glucosamine-MSM-Hyaluronic Acd (JOINT HEALTH PO) Take 1 tablet by mouth daily. JARROW JOINT BUILDER    . loratadine (CLARITIN) 10 MG tablet Take 10 mg by mouth at bedtime.     Marland Kitchen losartan (COZAAR) 25  MG tablet Take 1 tablet (25 mg total) by mouth 2 (two) times daily. 60 tablet 6  . LYCOPENE PO Take 1 capsule by mouth 2 (two) times daily. JARROW FORMULA LYCO-SORB PROSTATE SUPPORT     . metoprolol succinate (TOPROL-XL) 25 MG 24 hr tablet Take 1 tablet (25 mg total) by mouth daily. 30 tablet 0  . Multiple Vitamins-Minerals (OCUVITE PRESERVISION PO) Take 1 tablet by mouth 2 (two) times daily.     . Omega-3 Fatty Acids (FISH OIL PO) Take 1,000 mg by mouth daily.     Marland Kitchen omeprazole (PRILOSEC) 40 MG capsule Take 1 capsule (40 mg total) by mouth daily. 90 capsule 3  . rosuvastatin (CRESTOR) 20 MG tablet Take 1 tablet (20 mg total) at bedtime by mouth. 30 tablet 5   No current facility-administered medications on file prior to visit.     LABS/IMAGING: No results found for this or any previous visit (from the past 48 hour(s)). No results found.  LIPID PANEL:    Component Value Date/Time   CHOL 119 11/30/2016 1059   TRIG 76 11/30/2016 1059   HDL 44 11/30/2016 1059   CHOLHDL 2.7 11/30/2016 1059   VLDL 15 11/30/2016 1059   LDLCALC 60 11/30/2016 1059   LDLDIRECT 149.1 08/30/2012 1352    WEIGHTS: Wt Readings from Last 3 Encounters:  07/11/17 182 lb 6.4 oz (82.7 kg)  07/05/17 182 lb (82.6 kg)  06/17/17  182 lb (82.6 kg)    VITALS: BP 115/70   Pulse 60   Ht 5\' 10"  (1.778 m)   Wt 182 lb 6.4 oz (82.7 kg)   BMI 26.17 kg/m   EXAM: General appearance: alert and no distress Neck: no carotid bruit, no JVD and thyroid not enlarged, symmetric, no tenderness/mass/nodules Lungs: clear to auscultation bilaterally Heart: regular rate and rhythm, S1, S2 normal, no murmur, click, rub or gallop Abdomen: soft, non-tender; bowel sounds normal; no masses,  no organomegaly Extremities: extremities normal, atraumatic, no cyanosis or edema and varicose veins noted Pulses: 2+ and symmetric Skin: Skin color, texture, turgor normal. No rashes or lesions Neurologic: Grossly normal Psych: Pleasant  EKG: Normal sinus rhythm at 60, left axis deviation-personally reviewed  ASSESSMENT: 1. Cryptogenic stroke 2. Status post mitral valve repair 3. A-fib - S/p ILR 4. CHADSVASC Score of 4 - on Eliquis 5. Essential hypertension 6. Dyslipidemia  PLAN: 1.   Mr. Richert had symptomatic A. fib with heart rates up to 200s.  This was not seen on his loop recorder interrogation, at which time he was asymptomatic with his A. fib.  Fortunately has been on Eliquis and has not had any subsequent strokes.  He is now on beta-blocker and I expect that this may help significantly reduce his risk of recurrent A. fib.  He may have breakthrough, which time he may be a candidate for antiarrhythmic therapy.  I would likely refer him back to Dr. Lovena Le for this evaluation and management.  Follow-up with me annually or sooner as necessary.  [name], MD, [facility], [place] Director of the Advanced Lipid Disorders &  Cardiovascular Risk Reduction Clinic Diplomate of the American Board of Clinical Lipidology Attending Cardiologist  Direct Dial: [phone]  Fax: [phone]  Website:  [email or web address] 07/11/2017, 3:48 PM

## 2017-07-11 NOTE — Patient Instructions (Signed)
Your physician recommends that you continue on your current medications as directed. Please refer to the Current Medication list given to you today.  Follow up in October as planned. Please call in August to schedule this appointment.

## 2017-07-12 ENCOUNTER — Telehealth (INDEPENDENT_AMBULATORY_CARE_PROVIDER_SITE_OTHER): Payer: Self-pay | Admitting: Specialist

## 2017-07-12 ENCOUNTER — Encounter: Payer: Self-pay | Admitting: Neurology

## 2017-07-12 NOTE — Telephone Encounter (Signed)
Patient left voicemail message requesting a call back with xray results from Dr Louanne Skye. Patient's callback # [phone]

## 2017-07-13 NOTE — Telephone Encounter (Signed)
Patient left voicemail message requesting a call back with xray results from Dr Louanne Skye.

## 2017-07-14 ENCOUNTER — Encounter: Payer: Self-pay | Admitting: Internal Medicine

## 2017-07-14 ENCOUNTER — Ambulatory Visit (INDEPENDENT_AMBULATORY_CARE_PROVIDER_SITE_OTHER): Payer: PPO | Admitting: Internal Medicine

## 2017-07-14 VITALS — BP 129/67 | HR 64 | Temp 98.9°F | Resp 16 | Ht 70.0 in | Wt 182.0 lb

## 2017-07-14 DIAGNOSIS — D649 Anemia, unspecified: Secondary | ICD-10-CM | POA: Diagnosis not present

## 2017-07-14 DIAGNOSIS — R1031 Right lower quadrant pain: Secondary | ICD-10-CM

## 2017-07-14 DIAGNOSIS — C4492 Squamous cell carcinoma of skin, unspecified: Secondary | ICD-10-CM

## 2017-07-14 LAB — CBC
HEMATOCRIT: 41.6 % (ref 39.0–52.0)
HEMOGLOBIN: 14 g/dL (ref 13.0–17.0)
MCHC: 33.8 g/dL (ref 30.0–36.0)
MCV: 83.2 fl (ref 78.0–100.0)
PLATELETS: 256 10*3/uL (ref 150.0–400.0)
RBC: 4.99 Mil/uL (ref 4.22–5.81)
RDW: 13.4 % (ref 11.5–15.5)
WBC: 5.3 10*3/uL (ref 4.0–10.5)

## 2017-07-14 LAB — IRON: Iron: 100 ug/dL (ref 42–165)

## 2017-07-14 LAB — FERRITIN: FERRITIN: 152.6 ng/mL (ref 22.0–322.0)

## 2017-07-14 NOTE — Assessment & Plan Note (Signed)
Right groin pain: Suspect related to hip sprain or DJD.  Recommend conservative treatment with Tylenol, knows to avoid with NSAIDs.  If he is not gradually better or if he has severe symptoms he will call, likely will need x-ray and Ortho referral. Mild anemia: Noted on recent labs, ROS negative, had a colonoscopy 09-2012, he is anticoagulated.  Will check a CBC, iron, ferritin.  Low threshold for further work-up. SCC: Status post Mohs surgery right forehead, recovering well RTC as scheduled for August

## 2017-07-14 NOTE — Telephone Encounter (Signed)
Patient actually said he wanted the x-rays put on his MyChart if possible, he wanted to do this because Dr. [name] and Dr. [name] were unable to see them in the system for some reason. He wants the images of his back, rt arm/elbow/wrist. He said we could email him if we needed him to fill out release form : [email or web address]

## 2017-07-14 NOTE — Progress Notes (Signed)
Subjective:    Patient ID: Kevin Bass, male    DOB: Apr 07, 1940, 77 y.o.   MRN: [id number]  DOS:  07/14/2017 Type of visit - description : acute Interval history: 1 week history of right groin pain.  Pain does not radiate, is present when he walks or is active, no pain when he sits down. Denies any fall or injury. SCC: Recently diagnosed and treated for a SCC at the right forehead.  Had Mohs surgery, recovering well. I noted some anemia on recent labs.  He is anticoagulated.   Review of Systems Denies bulging area at the right groin. No abdominal pain per se, nausea or vomiting. No blood in the stools.  Past Medical History:  Diagnosis Date  . Allergy   . Anxiety   . Arthritis    hands  . Atrial fibrillation ([place])   . Colon cancer [facility])    s/p partial colectomy 1990, Cscope neg 7-09, next 2014  . DISORDER, MITRAL VALVE    MV recontruction at [place] 2006----still needs ABX for SBE prophylaxis   . Elevated PSA    [name]  . GERD (gastroesophageal reflux disease)   . Heart murmur    no problems since surgery  . HYPERGLYCEMIA, BORDERLINE 10/16/2008  . HYPERLIPIDEMIA 06/15/2006   diet controlled  . Hypertension   . HYPERTROPHY PROSTATE W/O UR OBST & OTH LUTS 10/16/2008  . Neuropathy   . Right groin pain    Dr. Redmond Pulling  . SCC (squamous cell carcinoma) 06/2017   s/p Mohs---[name]  . Stroke ([place])   . Wears partial dentures    lower partial    Past Surgical History:  Procedure Laterality Date  . COLECTOMY  1990  . COLONOSCOPY    . HERNIA REPAIR Left 2014   [facility] rep w/mesh- March 2014  . LOOP RECORDER INSERTION N/A 12/01/2016   Procedure: LOOP RECORDER INSERTION;  Surgeon: Evans Lance, MD;  Location: [place] CV LAB;  Service: Cardiovascular;  Laterality: N/A;  . MITRAL VALVE ANNULOPLASTY     [facility] 2006  . TEE WITHOUT CARDIOVERSION N/A 10/05/2016   Procedure: TRANSESOPHAGEAL ECHOCARDIOGRAM (TEE);  Surgeon: [name], MD;  Location: [facility] ENDOSCOPY;   Service: Cardiovascular;  Laterality: N/A;    Social History   Socioeconomic History  . Marital status: Married    Spouse name: suzanne  . Number of children: 2  . Years of education: college  . Highest education level: Not on file  Occupational History  . Occupation: retired   Scientific laboratory technician  . Financial resource strain: Not on file  . Food insecurity:    Worry: Not on file    Inability: Not on file  . Transportation needs:    Medical: Not on file    Non-medical: Not on file  Tobacco Use  . Smoking status: Former Smoker    Packs/day: 0.50    Years: 20.00    Pack years: 10.00    Types: Cigarettes    Last attempt to quit: 02/23/1983    Years since quitting: 34.4  . Smokeless tobacco: Never Used  Substance and Sexual Activity  . Alcohol use: Yes    Alcohol/week: 4.2 oz    Types: 7 Glasses of wine per week  . Drug use: No  . Sexual activity: Not on file  Lifestyle  . Physical activity:    Days per week: Not on file    Minutes per session: Not on file  . Stress: Not on file  Relationships  .  Social connections:    Talks on phone: Not on file    Gets together: Not on file    Attends religious service: Not on file    Active member of club or organization: Not on file    Attends meetings of clubs or organizations: Not on file    Relationship status: Not on file  . Intimate partner violence:    Fear of current or ex partner: Not on file    Emotionally abused: Not on file    Physically abused: Not on file    Forced sexual activity: Not on file  Other Topics Concern  . Not on file  Social History Narrative   From [place], household pt and wife   Daughter in [place], son in [place] as of 07/14/2017      Reactions   Statins Other (See Comments)   Myalgias (intolerance)      Medication List        Accurate as of 07/14/17 11:10 AM. Always use your most recent med list.          ALPRAZolam 0.25 MG tablet Commonly known as:  XANAX Take 1-2 tablets  (0.25-0.5 mg total) by mouth 2 (two) times daily as needed for anxiety.   Arginine 500 MG Caps Take 1,500 mg by mouth daily.   ELIQUIS 5 MG Tabs tablet Generic drug:  apixaban TAKE ONE TABLET BY MOUTH TWICE DAILY   ezetimibe 10 MG tablet Commonly known as:  ZETIA Take 1 tablet (10 mg total) by mouth daily.   FISH OIL PO Take 1,000 mg by mouth daily.   gabapentin 300 MG capsule Commonly known as:  NEURONTIN Take 2 cap in AM AND 3 caps at bedtime   JOINT HEALTH PO Take 1 tablet by mouth daily. JARROW JOINT BUILDER   loratadine 10 MG tablet Commonly known as:  CLARITIN Take 10 mg by mouth at bedtime.   losartan 25 MG tablet Commonly known as:  COZAAR Take 1 tablet (25 mg total) by mouth 2 (two) times daily.   LYCOPENE PO Take 1 capsule by mouth 2 (two) times daily. JARROW FORMULA LYCO-SORB PROSTATE SUPPORT   metoprolol succinate 25 MG 24 hr tablet Commonly known as:  TOPROL-XL Take 1 tablet (25 mg total) by mouth daily.   OCUVITE PRESERVISION PO Take 1 tablet by mouth 2 (two) times daily.   omeprazole 40 MG capsule Commonly known as:  PRILOSEC Take 1 capsule (40 mg total) by mouth daily.   rosuvastatin 20 MG tablet Commonly known as:  CRESTOR Take 1 tablet (20 mg total) at bedtime by mouth.          Objective:   Physical Exam BP 129/67 (BP Location: Right Arm, Patient Position: Sitting, Cuff Size: Small)   Pulse 64   Temp 98.9 F (37.2 C) (Oral)   Resp 16   Ht 5\' 10"  (1.778 m)   Wt 182 lb (82.6 kg)   SpO2 98%   BMI 26.11 kg/m  General:   Well developed, well nourished . NAD.  HEENT:  Normocephalic . Face symmetric, atraumatic Lungs:  CTA B Normal respiratory effort, no intercostal retractions, no accessory muscle use. Heart: RRR,  no murmur.  no pretibial edema bilaterally  Abdomen:  Not distended, soft, non-tender. No rebound or rigidity. Groins: No lymphadenopathy or hernia. GU: Scrotal contents normal. MSK: Hip rotations normal without  triggering any pain. Skin: Not pale. Not jaundice Neurologic:  alert & oriented  X3.  Speech normal, gait appropriate for age and unassisted, no limping. Psych--  Cognition and judgment appear intact.  Cooperative with normal attention span and concentration.  Behavior appropriate. No anxious or depressed appearing.     Assessment & Plan:  Assessment Prediabetes HTN Hyperlipidemia, statin intolerant (simva-pravachol: shoulder pain) Anxiety- rarely takes xanax Neuropahty --Facial neuropathy dx 2015, no w/u, sx resurface x1 after temporary d/c of meds  --saw neuro again 02/2017, labs (-), had a NCS, Rx gaba CV: --Mitral valve annuloplasty 2006-- needs SBE prophylaxis -- SOB 04-2016, seen at [facility], had a ECHO Nl fx MV, cath: no CAD --Cryptogenic stroke 09/12/2016, had slurred speech. Was recommended a loop recorder.started plavix -- new R PICA stroke, admitted 11-30-16, implanted loop recorder placed --A-FIB noted on ILR ~ 12-21-2016, changed to eliquis  Elevated PSA and BPH-- Dr. [name] Colon cancer partial colectomy 1990, colonoscopy 08-2007 negative, Cscope again 09-2012 normal, 5 years  SCC . MOHS 06/2017  PLAN:  Right groin pain: Suspect related to hip sprain or DJD.  Recommend conservative treatment with Tylenol, knows to avoid with NSAIDs.  If he is not gradually better or if he has severe symptoms he will call, likely will need x-ray and Ortho referral. Mild anemia: Noted on recent labs, ROS negative, had a colonoscopy 09-2012, he is anticoagulated.  Will check a CBC, iron, ferritin.  Low threshold for further work-up. SCC: Status post Mohs surgery right forehead, recovering well RTC as scheduled for August

## 2017-07-14 NOTE — Patient Instructions (Signed)
GO TO THE LAB : Get the blood work     Tylenol  500 mg OTC 2 tabs a day every 8 hours as needed for pain  If the pain is not improving gradually in the next couple of weeks please let me know

## 2017-07-15 ENCOUNTER — Other Ambulatory Visit: Payer: Self-pay | Admitting: *Deleted

## 2017-07-15 ENCOUNTER — Encounter: Payer: Self-pay | Admitting: Internal Medicine

## 2017-07-15 ENCOUNTER — Other Ambulatory Visit: Payer: Self-pay | Admitting: Internal Medicine

## 2017-07-15 MED ORDER — METOPROLOL SUCCINATE ER 25 MG PO TB24: 25.0000 mg | ORAL_TABLET | Freq: Every day | ORAL | 3 refills | Status: DC

## 2017-07-19 NOTE — Telephone Encounter (Signed)
I called and advised patient that we cannot send X-rays to his My Chart ( I called [facility] to confirm this). I did advise that all I could find in our new and old system was Lumbar spine (in [place] from 04/2017), and a hand and finger (in the old system) but not any new X-rays of an elbow or wrist.  I did advise that I could make him a CD with these images, however Dr. [name] and Dr. [name] with [facility] should have access to these images on the [facility] system.--He states that he will follow up with Dr. [name] at his appt on 07/26/17.

## 2017-07-26 ENCOUNTER — Ambulatory Visit (INDEPENDENT_AMBULATORY_CARE_PROVIDER_SITE_OTHER): Payer: PPO | Admitting: Neurology

## 2017-07-26 ENCOUNTER — Encounter: Payer: Self-pay | Admitting: Neurology

## 2017-07-26 ENCOUNTER — Other Ambulatory Visit: Payer: Self-pay

## 2017-07-26 ENCOUNTER — Encounter

## 2017-07-26 VITALS — BP 128/72 | HR 58 | Ht 70.0 in | Wt 185.0 lb

## 2017-07-26 DIAGNOSIS — I639 Cerebral infarction, unspecified: Secondary | ICD-10-CM

## 2017-07-26 DIAGNOSIS — G5621 Lesion of ulnar nerve, right upper limb: Secondary | ICD-10-CM | POA: Diagnosis not present

## 2017-07-26 DIAGNOSIS — M5417 Radiculopathy, lumbosacral region: Secondary | ICD-10-CM | POA: Diagnosis not present

## 2017-07-26 DIAGNOSIS — G5601 Carpal tunnel syndrome, right upper limb: Secondary | ICD-10-CM | POA: Diagnosis not present

## 2017-07-26 NOTE — Patient Instructions (Signed)
1. Stop Gabapentin 2. Start Lyrica 50mg  at night for 3 days, then increase to 50mg  twice a day. You can do it slowly with 75mg  capsule as well. Call our office on how you are feeling and we can adjust dose as needed 3. Refer to Dr. Fayette Pho with [facility] for ulnar neuropathy and carpal tunnel syndrome 4. Consider PT for right foot symptoms 5. Follow-up in 6 months, call for any changes

## 2017-07-26 NOTE — Progress Notes (Signed)
NEUROLOGY FOLLOW UP OFFICE NOTE  Kevin Bass [id number] 1940-09-10  HISTORY OF PRESENT ILLNESS: I had the pleasure of seeing Kevin Bass in follow-up in the neurology clinic on 08/03/2017. He is accompanied by his wife who helps supplement the history today. He was last seen 4 months ago for compressive neuropathy at the right elbow and wrist, L5 radiculopathy. He was initially seen for recurrent strokes. He is on Eliquis for atrial fibrillation. Since his last visit, he has tried to do PT but felt it was not helpful. He had also increased gabapentin but continued to have bothersome numbness and tingling on gabapentin 663m in AM, 90109min PM. No side effects. He tried CBD and increased dose to 100053mesterday, he is hopeful this will help him. He and his wife are concerned that he cannot drive longer distances because his foot numbness/tingling bothers him.   HPI 10/01/2016:  This is a pleasant [age] RH man with a history of colon cancer, hyperlipidemia, previous mitral valve repair in his usual state of health until 09/11/16 after taking a walk, he saw his brother-in-law and when he started noticing word-finding difficulties. His wife reported that his speech was not necessarily slurred but he was having trouble finding his words. When symptoms were unchanged the next day, he decided to go to the ER. He was still noted to have mild word-finding difficulties. There was no focal numbness/tingling/weakness. He denied any headaches, dizziness, diplopia, dysarthria/dysphagia. I personally reviewed MRI brain without contrast which showed an acute infarct in the left posterior insula and parietal operculum. MRA head and carotid dopplers did not show any significant stenosis. Echocardiogram showed EF 60-65%, mild LVH, prior MV repair without significant stenosis or regurgitation, normal LA size. He was switched from aspirin to Plavix. LDL was 124, HbA1c was 5.6. He was unable to get a TEE in the  hospital, and has seen Dr. HilDebara Pickettd agreed to having the procedure done on 8/14 followed by loop recorder. He denies any palpitations, chest pain, shortness of breath. No neck/back pain, bowel/bladder dysfunction. No falls. He denies any side effect on Plavix. He was started on Crestor yesterday. He feels he is 95% or more back to baseline, he denies any confusion or focal symptoms. He reports a history of facial pain on the right upper lip radiating up his cheek with good response to gabapentin. He takes 300m2ms with no side effects.  Update 12/07/16: He had no residual deficits from prior stroke, then on 11/29/16 he had sudden onset vertigo that made him fall backward. This lasted for 15 seconds and was followed by a mild headache. He called triage and was instructed to go seek medical attention the next day. He went to the ER and was found to have a right PICA territory stroke. He was evaluated by Neurology with no focal deficits seen. He reports he was taking aspirin and Plavix when the stroke occurred. He had an extensive workup for the prior stroke, he had a TEE in August 2018 which showed normal left atrium, no thrombus seen. He had a 48-hour monitor which was unremarkable. In the hospital, he had a CTA of the head and neck which did not show any occlusion or stenosis. Repeat TTE showed EF of 55-60%, left atrium mildly dilated. He had a CT chest/abdomen/pelvis which did not show any evidence of malignancy. On hospital discharge, he was instructed to continue aspirin and Plavix and stop his antihypertensives. He had a loop recorder placed.  Diagnostic  Data: Neuropathy labs were normal (TSH, B12, ESR, folate, SPEP/IFE). He had an EMG/NCV of the right UE and LE which showed right ulnar neuropathy with slowing across the elbow, axon loss and demyelinating in type, mild to moderate in degree; right median neuropathy at or distal to the wrist, consistent with carpal tunnel, mild in degree, and probable L5  radiculopathy, mild. No evidence of a large fiber sensorimotor polyneuropathy.    PAST MEDICAL HISTORY: Past Medical History:  Diagnosis Date  . Allergy   . Anxiety   . Arthritis    hands  . Atrial fibrillation ([place])   . Colon cancer [facility])    s/p partial colectomy 1990, Cscope neg 7-09, next 2014  . DISORDER, MITRAL VALVE    MV recontruction at [place] 2006----still needs ABX for SBE prophylaxis   . Elevated PSA    [name]  . GERD (gastroesophageal reflux disease)   . Heart murmur    no problems since surgery  . HYPERGLYCEMIA, BORDERLINE 10/16/2008  . HYPERLIPIDEMIA 06/15/2006   diet controlled  . Hypertension   . HYPERTROPHY PROSTATE W/O UR OBST & OTH LUTS 10/16/2008  . Neuropathy   . Right groin pain    Dr. Redmond Pulling  . SCC (squamous cell carcinoma) 06/2017   s/p Mohs---[name]  . Stroke ([place])   . Wears partial dentures    lower partial    MEDICATIONS: Current Outpatient Medications on File Prior to Visit  Medication Sig Dispense Refill  . ALPRAZolam (XANAX) 0.25 MG tablet Take 1-2 tablets (0.25-0.5 mg total) by mouth 2 (two) times daily as needed for anxiety. 40 tablet 1  . Arginine 500 MG CAPS Take 1,500 mg by mouth daily.    Marland Kitchen ELIQUIS 5 MG TABS tablet TAKE ONE TABLET BY MOUTH TWICE DAILY  180 tablet 1  . ezetimibe (ZETIA) 10 MG tablet Take 1 tablet (10 mg total) by mouth daily. 90 tablet 1  . gabapentin (NEURONTIN) 300 MG capsule Take 2 cap in AM AND 3 caps at bedtime 150 capsule 5  . Glucosamine-MSM-Hyaluronic Acd (JOINT HEALTH PO) Take 1 tablet by mouth daily. JARROW JOINT BUILDER    . loratadine (CLARITIN) 10 MG tablet Take 10 mg by mouth at bedtime.     Marland Kitchen losartan (COZAAR) 25 MG tablet Take 1 tablet (25 mg total) by mouth 2 (two) times daily. 60 tablet 6  . LYCOPENE PO Take 1 capsule by mouth 2 (two) times daily. JARROW FORMULA LYCO-SORB PROSTATE SUPPORT     . metoprolol succinate (TOPROL-XL) 25 MG 24 hr tablet Take 1 tablet (25 mg total) by mouth daily. 90 tablet 3   . Multiple Vitamins-Minerals (OCUVITE PRESERVISION PO) Take 1 tablet by mouth 2 (two) times daily.     . Omega-3 Fatty Acids (FISH OIL PO) Take 1,000 mg by mouth daily.     Marland Kitchen omeprazole (PRILOSEC) 40 MG capsule Take 1 capsule (40 mg total) by mouth daily. 90 capsule 3  . rosuvastatin (CRESTOR) 20 MG tablet Take 1 tablet (20 mg total) at bedtime by mouth. 30 tablet 5   No current facility-administered medications on file prior to visit.     ALLERGIES: Allergies  Allergen Reactions  . Statins Other (See Comments)    Myalgias (intolerance)    FAMILY HISTORY: Family History  Problem Relation Age of Onset  . Colon cancer Other        M side (cousins)  . Diabetes Neg Hx   . Stroke Neg Hx   . CAD  Neg Hx   . Prostate cancer Neg Hx   . Rectal cancer Neg Hx   . Esophageal cancer Neg Hx     SOCIAL HISTORY: Social History   Socioeconomic History  . Marital status: Married    Spouse name: suzanne  . Number of children: 2  . Years of education: college  . Highest education level: Not on file  Occupational History  . Occupation: retired   Scientific laboratory technician  . Financial resource strain: Not on file  . Food insecurity:    Worry: Not on file    Inability: Not on file  . Transportation needs:    Medical: Not on file    Non-medical: Not on file  Tobacco Use  . Smoking status: Former Smoker    Packs/day: 0.50    Years: 20.00    Pack years: 10.00    Types: Cigarettes    Last attempt to quit: 02/23/1983    Years since quitting: 34.4  . Smokeless tobacco: Never Used  Substance and Sexual Activity  . Alcohol use: Yes    Alcohol/week: 4.2 oz    Types: 7 Glasses of wine per week  . Drug use: No  . Sexual activity: Not on file  Lifestyle  . Physical activity:    Days per week: Not on file    Minutes per session: Not on file  . Stress: Not on file  Relationships  . Social connections:    Talks on phone: Not on file    Gets together: Not on file    Attends religious service: Not  on file    Active member of club or organization: Not on file    Attends meetings of clubs or organizations: Not on file    Relationship status: Not on file  . Intimate partner violence:    Fear of current or ex partner: Not on file    Emotionally abused: Not on file    Physically abused: Not on file    Forced sexual activity: Not on file  Other Topics Concern  . Not on file  Social History Narrative   From [place], household pt and wife   Daughter in [place], son in [place]: Constitutional: No fevers, chills, or sweats, no generalized fatigue, change in appetite Eyes: No visual changes, double vision, eye pain Ear, nose and throat: No hearing loss, ear pain, nasal congestion, sore throat Cardiovascular: No chest pain, palpitations Respiratory:  No shortness of breath at rest or with exertion, wheezes GastrointestinaI: No nausea, vomiting, diarrhea, abdominal pain, fecal incontinence Genitourinary:  No dysuria, urinary retention or frequency Musculoskeletal:  + neck pain, back pain Integumentary: No rash, pruritus, skin lesions Neurological: as above Psychiatric: No depression, insomnia, anxiety Endocrine: No palpitations, fatigue, diaphoresis, mood swings, change in appetite, change in weight, increased thirst Hematologic/Lymphatic:  No anemia, purpura, petechiae. Allergic/Immunologic: no itchy/runny eyes, nasal congestion, recent allergic reactions, rashes  PHYSICAL EXAM: Vitals:   07/26/17 1340  BP: 128/72  Pulse: (!) 58  SpO2: 98%   General: No acute distress Head:  Normocephalic/atraumatic Neck: supple, no paraspinal tenderness, full range of motion Heart:  Regular rate and rhythm Lungs:  Clear to auscultation bilaterally Back: No paraspinal tenderness Skin/Extremities: No rash, no edema Neurological Exam: alert and oriented to person, place, and time. No aphasia or dysarthria. Fund of knowledge is appropriate.  Recent and remote memory are intact.   Attention and concentration are normal.    Able to name  objects and repeat phrases. Cranial nerves: Pupils equal, round, reactive to light.  Extraocular movements intact with no nystagmus. Visual fields full. Facial sensation intact. No facial asymmetry. Tongue, uvula, palate midline.  Motor: Bulk and tone normal, muscle strength 5/5 throughout, including fine finger movements, no pronator drift.  Sensation: decreased cold and pin on right UE, hyperesthesia with tingling on pin testing in hands, R>L. Decreased pin, cold, vibration sense to ankles bilaterally (similar to prior). Deep tendon reflexes +1 right UE, +2 left UE and bilateral patellar reflexes, absent ankle jerks bilaterally.Finger to nose testing intact.  Gait narrow-based and steady, able to tandem walk adequately.  Romberg negative. Negative Tinel sign at elbow and wrist  IMPRESSION: This is a pleasant 77 yo RH man with a history of colon cancer, hyperlipidemia, previous mitral valve repair with recurrent strokes secondary to atrial fibrillation. He had a left MCA branch infarct last July 2018 with transient mild aphasia, then a right PICA territory infarct with transient vertigo last 11/29/16. He started having paresthesias in both hands and right foot, with EMG/NCV showing right ulnar neuropathy at the elbow and right median neuropathy at or distal to the wrist, as well as a right L5 radiculopathy. He continues to have bothersome numbness and paresthesias and would like to switch to Lyrica instead of increasing Gabapentin. He was given samples for Lyrica 57m qhs x 3 nights, then increase to 548mBID. Side effects were discussed, we can uptitrate as tolerated. He is interested in other options for the ulnar neuropathy and carpal tunnel syndrome, [facility] JaFayette PhoWe discussed right foot symptoms and physical therapy for L5 radiculopathy, he will think about it. Continue Eliquis and  control of vascular risk factors for secondary stroke prevention. He knows to go to the ER for any sudden changes in symptoms. He will follow-up in 6 months and knows to call for any changes.   Thank you for allowing me to participate in his care.  Please do not hesitate to call for any questions or concerns.  The duration of this appointment visit was 30 minutes of face-to-face time with the patient.  Greater than 50% of this time was spent in counseling, explanation of diagnosis, planning of further management, and coordination of care.   KaEllouise NewerM.D.   CC: Dr. PaLarose Kells

## 2017-08-01 ENCOUNTER — Ambulatory Visit (INDEPENDENT_AMBULATORY_CARE_PROVIDER_SITE_OTHER): Payer: PPO | Admitting: *Deleted

## 2017-08-01 DIAGNOSIS — I480 Paroxysmal atrial fibrillation: Secondary | ICD-10-CM

## 2017-08-01 NOTE — Progress Notes (Signed)
Carelink Summary Report / Loop Recorder 

## 2017-08-03 ENCOUNTER — Encounter: Payer: Self-pay | Admitting: Neurology

## 2017-08-10 ENCOUNTER — Encounter: Payer: Self-pay | Admitting: Neurology

## 2017-08-23 ENCOUNTER — Encounter: Payer: Self-pay | Admitting: Internal Medicine

## 2017-08-24 DIAGNOSIS — G5601 Carpal tunnel syndrome, right upper limb: Secondary | ICD-10-CM | POA: Diagnosis not present

## 2017-08-24 DIAGNOSIS — G5621 Lesion of ulnar nerve, right upper limb: Secondary | ICD-10-CM | POA: Diagnosis not present

## 2017-08-25 ENCOUNTER — Other Ambulatory Visit: Payer: Self-pay | Admitting: Internal Medicine

## 2017-08-29 ENCOUNTER — Telehealth: Payer: Self-pay | Admitting: Internal Medicine

## 2017-08-29 NOTE — Telephone Encounter (Signed)
Sent!

## 2017-08-29 NOTE — Telephone Encounter (Signed)
Pt is requesting refill on alprazolam.   Last OV: 07/14/2017 Last Fill: 11/03/2016 #40 and 1RF UDS: 11/10/2016 Low risk  NCCR printed- no discrepancies noted- sent for scanning  Please advise.

## 2017-09-06 LAB — CUP PACEART REMOTE DEVICE CHECK
Implantable Pulse Generator Implant Date: 20181010
MDC IDC SESS DTM: 20190609114048

## 2017-09-08 ENCOUNTER — Other Ambulatory Visit: Payer: Self-pay | Admitting: Internal Medicine

## 2017-09-23 ENCOUNTER — Encounter: Payer: Self-pay | Admitting: Internal Medicine

## 2017-09-24 ENCOUNTER — Other Ambulatory Visit: Payer: Self-pay | Admitting: Internal Medicine

## 2017-09-30 ENCOUNTER — Telehealth: Payer: Self-pay

## 2017-09-30 ENCOUNTER — Encounter: Payer: Self-pay | Admitting: Gastroenterology

## 2017-09-30 ENCOUNTER — Telehealth: Payer: Self-pay | Admitting: *Deleted

## 2017-09-30 NOTE — Telephone Encounter (Signed)
Patient with diagnosis of Afib on Eliquis for anticoagulation.    Procedure: carpal tunnel surgery Date of procedure: TBD  CHADS2-VASc score of  5 (CHF, HTN, AGE, DM2, stroke/tia x 2, CAD, AGE, male)  CrCl 43ml/min  Per office protocol, patient can hold Eliquis for 24 hours prior to procedure.  Due to history of prior stroke would recommend resume anticoagulation as soon as safe post procedure.

## 2017-09-30 NOTE — Telephone Encounter (Signed)
   [facility] Pre-operative Risk Assessment    Request for surgical clearance:  1. What type of surgery is being performed? Right Carpal and Cubital Tunnel releases  2. When is this surgery scheduled? TBD  3. What type of clearance is required (medical clearance vs. Pharmacy clearance to hold med vs. Both)? Both  4. Are there any medications that need to be held prior to surgery and how long? Eliquis  5. Practice name and name of physician performing surgery? [facility]   6. What is your office phone and fax number? [phone] Fax [phone] ATTN: Tammi Sou  7. Anesthesia type (None, local, MAC, general) ? Unknown   Meryl Crutch 09/30/2017, 9:29 AM  _________________________________________________________________   (provider comments below)

## 2017-09-30 NOTE — Telephone Encounter (Signed)
Pt has recent afib RVR in May, converted on BB.   Will route to CVRR first for recommendations regarding holding anticoagulation.  Patient will then need a phone call to assess for any clinical symptoms since May.

## 2017-09-30 NOTE — Telephone Encounter (Signed)
Received Medical/Surgical Clearance Form from [place], [facility]/Surgical Coordinator; informed via phone conversation that provider will RTO on 10/12/17; clearance pending from Cardiology, see attached note below; will forward to provider upon RTO/SLS 08/09   Conversation: Clearance TBD  (Newest Message First)   09/30/17 12:56 PM  Daune Perch, NP routed this conversation to [place], NP   09/30/17 12:55 PM  Note    Pt has recent afib RVR in May, converted on BB.   Will route to CVRR first for recommendations regarding holdinganticoagulation.  Patient will then need a phone call to assess for any clinical symptoms since May.          09/30/17 9:34 AM  Meryl Crutch, RN routed this conversation to [facility] Preop  Meryl Crutch, RN    09/30/17 9:29 AM  Note       [facility] Pre-operative Risk Assessment    Request for surgical clearance:  1. What type of surgery is being performed? Right Carpal and Cubital Tunnel releases 2. When is this surgery scheduled? TBD 3. What type of clearance is required (medical clearance vs. Pharmacy clearance to hold med vs. Both)? Both 4. Are there any medications that need to be held prior to surgery and how long? Eliquis 5.   Practice name and name of physician performing surgery? [facility]  6.   What is your office phone and fax number? [phone] Fax [phone] ATTN: Tammi Sou 7. Anesthesia type (None, local, MAC, general) ? Unknown Meryl Crutch 09/30/2017, 9:29 AM  _________________________________________________________________   (provider comments below)

## 2017-09-30 NOTE — Telephone Encounter (Signed)
   Primary Cardiologist: Cristopher Peru, MD  Chart reviewed as part of pre-operative protocol coverage. Patient was contacted 09/30/2017 in reference to pre-operative risk assessment for pending surgery as outlined below.  Kevin Bass was last seen on 07/11/2017 by Dr. Debara Pickett.  Since that day, Kevin Bass has done well.  He had an episode of atrial fibrillation with RVR with rates over 200 in May and metoprolol was initiated.  He has been maintaining normal sinus rhythm since that time with no recurrent symptoms.  He is anticoagulated for stroke risk reduction in the setting of prior CVA.  The patient has concerns of his risk during the surgery.  I think the biggest risk is going back into A. fib with RVR which can occur under stress.  We discussed that this can be treated and he should take his beta-blocker on the day of the procedure to reduce the possibility of A. fib.  Therefore, based on ACC/AHA guidelines, the patient would be at acceptable risk for the planned procedure without further cardiovascular testing.   According to our pharmacy protocol: Patient with diagnosis of Afib on Eliquis for anticoagulation.    Procedure: carpal tunnel surgery Date of procedure: TBD  CHADS2-VASc score of  5 (CHF, HTN, AGE, DM2, stroke/tia x 2, CAD, AGE, male)  CrCl 24ml/min  Per office protocol, patient can hold Eliquis for 24 hours prior to procedure.  Due to history of prior stroke would recommend resume anticoagulation as soon as safe post procedure.   I will route this recommendation to the requesting party via Epic fax function and remove from pre-op pool.  Please call with questions.  Daune Perch, NP 09/30/2017, 3:00 PM

## 2017-10-05 DIAGNOSIS — L570 Actinic keratosis: Secondary | ICD-10-CM | POA: Diagnosis not present

## 2017-10-05 DIAGNOSIS — D485 Neoplasm of uncertain behavior of skin: Secondary | ICD-10-CM | POA: Diagnosis not present

## 2017-10-05 DIAGNOSIS — L821 Other seborrheic keratosis: Secondary | ICD-10-CM | POA: Diagnosis not present

## 2017-10-05 DIAGNOSIS — Z85828 Personal history of other malignant neoplasm of skin: Secondary | ICD-10-CM | POA: Diagnosis not present

## 2017-10-05 DIAGNOSIS — L989 Disorder of the skin and subcutaneous tissue, unspecified: Secondary | ICD-10-CM | POA: Diagnosis not present

## 2017-10-05 DIAGNOSIS — D1801 Hemangioma of skin and subcutaneous tissue: Secondary | ICD-10-CM | POA: Diagnosis not present

## 2017-10-05 DIAGNOSIS — D225 Melanocytic nevi of trunk: Secondary | ICD-10-CM | POA: Diagnosis not present

## 2017-10-05 DIAGNOSIS — D229 Melanocytic nevi, unspecified: Secondary | ICD-10-CM | POA: Diagnosis not present

## 2017-10-12 NOTE — Telephone Encounter (Signed)
Form completed by PCP on return to office. Faxed to [facility] and sent for scanning.

## 2017-10-13 ENCOUNTER — Encounter: Payer: Self-pay | Admitting: Internal Medicine

## 2017-10-13 ENCOUNTER — Ambulatory Visit (INDEPENDENT_AMBULATORY_CARE_PROVIDER_SITE_OTHER): Payer: PPO | Admitting: Internal Medicine

## 2017-10-13 VITALS — BP 120/68 | HR 70 | Temp 98.1°F | Resp 16 | Ht 70.0 in | Wt 185.2 lb

## 2017-10-13 DIAGNOSIS — F4321 Adjustment disorder with depressed mood: Secondary | ICD-10-CM

## 2017-10-13 DIAGNOSIS — I10 Essential (primary) hypertension: Secondary | ICD-10-CM | POA: Diagnosis not present

## 2017-10-13 DIAGNOSIS — Z09 Encounter for follow-up examination after completed treatment for conditions other than malignant neoplasm: Secondary | ICD-10-CM

## 2017-10-13 DIAGNOSIS — E7800 Pure hypercholesterolemia, unspecified: Secondary | ICD-10-CM | POA: Diagnosis not present

## 2017-10-13 LAB — LIPID PANEL
CHOL/HDL RATIO: 5
Cholesterol: 190 mg/dL (ref 0–200)
HDL: 37.3 mg/dL — ABNORMAL LOW (ref >39.00)
LDL Cholesterol: 120 mg/dL — ABNORMAL HIGH (ref 0–99)
NONHDL: 152.2
Triglycerides: 159 mg/dL — ABNORMAL HIGH (ref 0.0–149.0)
VLDL: 31.8 mg/dL (ref 0.0–40.0)

## 2017-10-13 NOTE — Assessment & Plan Note (Addendum)
Grieving: Loss his wife July 2019, patient is supported to the best of my ability, reports that he is doing "okay" for the circumstances, he does have family support.  Knows to call me if needed. HTN: Currently on losartan, metoprolol.  Last BMP satisfactory Hyperlipidemia:  checking labs. Addendum: Cholesterol has increased significantly, the patient used to be on Crestor but stopped it few months ago due to aches and pains.  He is still on Zetia. We agreed that he will go back on Crestor and let me know if the aches and pains resurface. Ulnar neuropathy and CTS: Status post evaluation by Dr. [name] and Dr. Tamera Punt, to have surgery, he is somewhat concerned about the procedure, risk and benefits discussed. has already been cleared to stop Eliquis for 48 hours by cardiology. RTC 6 months

## 2017-10-13 NOTE — Progress Notes (Addendum)
Subjective:    Patient ID: Kevin Bass, male    DOB: Sep 11, 1940, 77 y.o.   MRN: [id number]  DOS:  10/13/2017 Type of visit - description : F/U Interval history: Since the last visit, he had a very difficult time, lost his wife in July 2019. Also yesterday he learned that he lost his sister. Status post evaluation by Dr. [name] and Dr. Tamera Punt, planning to have surgery. HTN: Well-controlled in the ambulatory setting.   Review of Systems   Past Medical History:  Diagnosis Date  . Allergy   . Anxiety   . Arthritis    hands  . Atrial fibrillation ([place])   . Colon cancer [facility])    s/p partial colectomy 1990, Cscope neg 7-09, next 2014  . DISORDER, MITRAL VALVE    MV recontruction at [place] 2006----still needs ABX for SBE prophylaxis   . Elevated PSA    [name]  . GERD (gastroesophageal reflux disease)   . Heart murmur    no problems since surgery  . HYPERGLYCEMIA, BORDERLINE 10/16/2008  . HYPERLIPIDEMIA 06/15/2006   diet controlled  . Hypertension   . HYPERTROPHY PROSTATE W/O UR OBST & OTH LUTS 10/16/2008  . Neuropathy   . Right groin pain    Dr. Redmond Pulling  . SCC (squamous cell carcinoma) 06/2017   s/p Mohs---[name]  . Stroke ([place])   . Wears partial dentures    lower partial    Past Surgical History:  Procedure Laterality Date  . COLECTOMY  1990  . COLONOSCOPY    . HERNIA REPAIR Left 2014   [facility] rep w/mesh- March 2014  . LOOP RECORDER INSERTION N/A 12/01/2016   Procedure: LOOP RECORDER INSERTION;  Surgeon: Evans Lance, MD;  Location: [place] CV LAB;  Service: Cardiovascular;  Laterality: N/A;  . MITRAL VALVE ANNULOPLASTY     [facility] 2006  . TEE WITHOUT CARDIOVERSION N/A 10/05/2016   Procedure: TRANSESOPHAGEAL ECHOCARDIOGRAM (TEE);  Surgeon: [name], MD;  Location: [facility] ENDOSCOPY;  Service: Cardiovascular;  Laterality: N/A;    Social History   Socioeconomic History  . Marital status: Widowed    Spouse name: suzanne  . Number of children: 2  .  Years of education: college  . Highest education level: Not on file  Occupational History  . Occupation: retired   Scientific laboratory technician  . Financial resource strain: Not on file  . Food insecurity:    Worry: Not on file    Inability: Not on file  . Transportation needs:    Medical: Not on file    Non-medical: Not on file  Tobacco Use  . Smoking status: Former Smoker    Packs/day: 0.50    Years: 20.00    Pack years: 10.00    Types: Cigarettes    Last attempt to quit: 02/23/1983    Years since quitting: 34.6  . Smokeless tobacco: Never Used  Substance and Sexual Activity  . Alcohol use: Yes    Alcohol/week: 7.0 standard drinks    Types: 7 Glasses of wine per week  . Drug use: No  . Sexual activity: Not on file  Lifestyle  . Physical activity:    Days per week: Not on file    Minutes per session: Not on file  . Stress: Not on file  Relationships  . Social connections:    Talks on phone: Not on file    Gets together: Not on file    Attends religious service: Not on file    Active  member of club or organization: Not on file    Attends meetings of clubs or organizations: Not on file    Relationship status: Not on file  . Intimate partner violence:    Fear of current or ex partner: Not on file    Emotionally abused: Not on file    Physically abused: Not on file    Forced sexual activity: Not on file  Other Topics Concern  . Not on file  Social History Narrative   From [place], lost wife 08/2017 to cancer   daughter in [place], son in [place] as of 10/13/2017      Reactions   Statins Other (See Comments)   Myalgias (intolerance)      Medication List        Accurate as of 10/13/17  3:19 PM. Always use your most recent med list.          ALPRAZolam 0.25 MG tablet Commonly known as:  XANAX TAKE ONE OR TWO TABLETS BY MOUTH TWICE DAILY AS NEEDED FOR ANXIETY   Arginine 500 MG Caps Take 1,500 mg by mouth daily.   ELIQUIS 5 MG Tabs tablet Generic drug:   apixaban TAKE ONE TABLET BY MOUTH TWICE DAILY   ezetimibe 10 MG tablet Commonly known as:  ZETIA Take 1 tablet (10 mg total) by mouth daily.   FISH OIL PO Take 1,000 mg by mouth daily.   JOINT HEALTH PO Take 1 tablet by mouth daily. JARROW JOINT BUILDER   loratadine 10 MG tablet Commonly known as:  CLARITIN Take 10 mg by mouth at bedtime.   losartan 25 MG tablet Commonly known as:  COZAAR Take 1 tablet (25 mg total) by mouth 2 (two) times daily.   LYCOPENE PO Take 1 capsule by mouth 2 (two) times daily. JARROW FORMULA LYCO-SORB PROSTATE SUPPORT   metoprolol succinate 25 MG 24 hr tablet Commonly known as:  TOPROL-XL Take 1 tablet (25 mg total) by mouth daily.   OCUVITE PRESERVISION PO Take 1 tablet by mouth 2 (two) times daily.   omeprazole 40 MG capsule Commonly known as:  PRILOSEC Take 1 capsule (40 mg total) by mouth daily.          Objective:   Physical Exam BP 120/68 (BP Location: Left Arm, Patient Position: Sitting, Cuff Size: Normal)   Pulse 70   Temp 98.1 F (36.7 C) (Oral)   Resp 16   Ht 5\' 10"  (1.778 m)   Wt 185 lb 3.2 oz (84 kg)   SpO2 96%   BMI 26.57 kg/m  General:   Well developed, NAD, see BMI.  HEENT:  Normocephalic . Face symmetric, atraumatic Lungs:  CTA B Normal respiratory effort, no intercostal retractions, no accessory muscle use. Heart: RRR,  no murmur.  No pretibial edema bilaterally  Skin: Not pale. Not jaundice Neurologic:  alert & oriented X3.  Speech normal, gait appropriate for age and unassisted Psych--  Cognition and judgment appear intact.  Cooperative with normal attention span and concentration.  Behavior appropriate. No anxious or depressed appearing.      Assessment & Plan:   Assessment Prediabetes HTN Hyperlipidemia, statin intolerant (simva-pravachol: shoulder pain) Anxiety- rarely takes xanax Neuroy --Facial neuropathy dx 2015, no w/u, sx resurface x1 after temporary d/c of meds  --saw neuro again  02/2017, labs (-), had a NCS, Rx gaba CV: --Mitral valve annuloplasty 2006-- needs SBE prophylaxis -- SOB 04-2016, seen at [facility], had a ECHO Nl  fx MV, cath: no CAD --Cryptogenic stroke 09/12/2016, had slurred speech. Was recommended a loop recorder.started plavix -- new R PICA stroke, admitted 11-30-16, implanted loop recorder placed --A-FIB noted on ILR ~ 12-21-2016, changed to eliquis  Elevated PSA and BPH-- Dr. [name] Colon cancer partial colectomy 1990, colonoscopy 08-2007 negative, Cscope again 09-2012 normal, 5 years  SCC . MOHS 06/2017  PLAN:  Grieving: Loss his wife July 2019, patient is supported to the best of my ability, reports that he is doing "okay" for the circumstances, he does have family support.  Knows to call me if needed. HTN: Currently on losartan, metoprolol.  Last BMP satisfactory Hyperlipidemia:  checking labs. Addendum: Cholesterol has increased significantly, the patient used to be on Crestor but stopped it few months ago due to aches and pains.  He is still on Zetia. We agreed that he will go back on Crestor and let me know if the aches and pains resurface. Ulnar neuropathy and CTS: Status post evaluation by Dr. [name] and Dr. Tamera Punt, to have surgery, he is somewhat concerned about the procedure, risk and benefits discussed. has already been cleared to stop Eliquis for 48 hours by cardiology. RTC 6 months F2F> 20 min

## 2017-10-13 NOTE — Patient Instructions (Signed)
GO TO THE LAB : Get the blood work     GO TO THE FRONT DESK Schedule your next appointment for a   checkup in 6 months.  Please call sooner if needed.

## 2017-10-26 DIAGNOSIS — G5601 Carpal tunnel syndrome, right upper limb: Secondary | ICD-10-CM | POA: Diagnosis not present

## 2017-10-31 ENCOUNTER — Ambulatory Visit (INDEPENDENT_AMBULATORY_CARE_PROVIDER_SITE_OTHER): Payer: PPO | Admitting: *Deleted

## 2017-10-31 DIAGNOSIS — I639 Cerebral infarction, unspecified: Secondary | ICD-10-CM | POA: Diagnosis not present

## 2017-10-31 NOTE — Progress Notes (Signed)
Carelink Summary Report / Loop Recorder 

## 2017-11-11 DIAGNOSIS — G5621 Lesion of ulnar nerve, right upper limb: Secondary | ICD-10-CM | POA: Diagnosis not present

## 2017-11-11 DIAGNOSIS — G5601 Carpal tunnel syndrome, right upper limb: Secondary | ICD-10-CM | POA: Diagnosis not present

## 2017-11-15 DIAGNOSIS — M21962 Unspecified acquired deformity of left lower leg: Secondary | ICD-10-CM | POA: Diagnosis not present

## 2017-11-15 DIAGNOSIS — M21612 Bunion of left foot: Secondary | ICD-10-CM | POA: Diagnosis not present

## 2017-11-15 DIAGNOSIS — M7752 Other enthesopathy of left foot: Secondary | ICD-10-CM | POA: Diagnosis not present

## 2017-11-15 DIAGNOSIS — M205X2 Other deformities of toe(s) (acquired), left foot: Secondary | ICD-10-CM | POA: Diagnosis not present

## 2017-11-18 LAB — CUP PACEART REMOTE DEVICE CHECK
Implantable Pulse Generator Implant Date: 20181010
MDC IDC SESS DTM: 20190908120636

## 2017-11-21 ENCOUNTER — Encounter: Payer: Self-pay | Admitting: Gastroenterology

## 2017-11-21 ENCOUNTER — Ambulatory Visit (INDEPENDENT_AMBULATORY_CARE_PROVIDER_SITE_OTHER): Payer: PPO | Admitting: Gastroenterology

## 2017-11-21 VITALS — BP 118/72 | HR 68 | Ht 70.0 in | Wt 183.0 lb

## 2017-11-21 DIAGNOSIS — I4891 Unspecified atrial fibrillation: Secondary | ICD-10-CM | POA: Diagnosis not present

## 2017-11-21 DIAGNOSIS — Z85038 Personal history of other malignant neoplasm of large intestine: Secondary | ICD-10-CM | POA: Diagnosis not present

## 2017-11-21 DIAGNOSIS — Z7901 Long term (current) use of anticoagulants: Secondary | ICD-10-CM | POA: Diagnosis not present

## 2017-11-21 NOTE — Progress Notes (Signed)
HPI :  77 year old male here for a follow-up visit. He has a history of colon cancer diagnosed at age 66. His last colonoscopy was performed in August 2014. He is due for surveillance at this time. She has a history of atrial fibrillation, for which she is taking liquids. He has a history of stroke, he thinks last event was about one year ago.   He denies any blood in his stools. He reports his bowel habits are regular. He denies any abdominal pains. He reports reflux has been fairly well controlled and belching no longer bothering him much. He is been off PPI for the past year or so.  He's had a very stressful recent few months. His wife unexpectedly passed away recently, as well as another family member. He has had a hard time coping with this. He's been having pain in his right arm and hand reportedly due to nerve impingement, he is having upcoming procedure for that issue.  EGD 11/20/2015 -  Mild gastric erythema, no HH, otherwise normal  Colonoscopy 10/18/2012 - normal, told to repeat in 5 years  Past Medical History:  Diagnosis Date  . Allergy   . Anxiety   . Arthritis    hands  . Atrial fibrillation ([place])   . Colon cancer [facility])    s/p partial colectomy 1990, Cscope neg 7-09, next 2014  . DISORDER, MITRAL VALVE    MV recontruction at [place] 2006----still needs ABX for SBE prophylaxis   . Elevated PSA    [name]  . GERD (gastroesophageal reflux disease)   . Heart murmur    no problems since surgery  . HYPERGLYCEMIA, BORDERLINE 10/16/2008  . HYPERLIPIDEMIA 06/15/2006   diet controlled  . Hypertension   . HYPERTROPHY PROSTATE W/O UR OBST & OTH LUTS 10/16/2008  . Neuropathy   . Right groin pain    Dr. Redmond Pulling  . SCC (squamous cell carcinoma) 06/2017   s/p Mohs---[name]  . Stroke ([place])   . Wears partial dentures    lower partial     Past Surgical History:  Procedure Laterality Date  . COLECTOMY  1990  . COLONOSCOPY    . HERNIA REPAIR Left 2014   [facility] rep w/mesh- March  2014  . LOOP RECORDER INSERTION N/A 12/01/2016   Procedure: LOOP RECORDER INSERTION;  Surgeon: Evans Lance, MD;  Location: [place] CV LAB;  Service: Cardiovascular;  Laterality: N/A;  . MITRAL VALVE ANNULOPLASTY     [facility] 2006  . TEE WITHOUT CARDIOVERSION N/A 10/05/2016   Procedure: TRANSESOPHAGEAL ECHOCARDIOGRAM (TEE);  Surgeon: [name], MD;  Location: [facility] ENDOSCOPY;  Service: Cardiovascular;  Laterality: N/A;   Family History  Problem Relation Age of Onset  . Colon cancer Other        M side (cousins)  . Diabetes Neg Hx   . Stroke Neg Hx   . CAD Neg Hx   . Prostate cancer Neg Hx   . Rectal cancer Neg Hx   . Esophageal cancer Neg Hx    Social History   Tobacco Use  . Smoking status: Former Smoker    Packs/day: 0.50    Years: 20.00    Pack years: 10.00    Types: Cigarettes    Last attempt to quit: 02/23/1983    Years since quitting: 34.7  . Smokeless tobacco: Never Used  Substance Use Topics  . Alcohol use: Yes    Alcohol/week: 7.0 standard drinks    Types: 7 Glasses of wine per  week  . Drug use: No   Current Outpatient Medications  Medication Sig Dispense Refill  . ALPRAZolam (XANAX) 0.25 MG tablet TAKE ONE OR TWO TABLETS BY MOUTH TWICE DAILY AS NEEDED FOR ANXIETY  40 tablet 1  . Arginine 500 MG CAPS Take 1,500 mg by mouth daily.    Marland Kitchen ELIQUIS 5 MG TABS tablet TAKE ONE TABLET BY MOUTH TWICE DAILY  180 tablet 1  . ezetimibe (ZETIA) 10 MG tablet Take 1 tablet (10 mg total) by mouth daily. 90 tablet 1  . Glucosamine-MSM-Hyaluronic Acd (JOINT HEALTH PO) Take 1 tablet by mouth daily. JARROW JOINT BUILDER    . loratadine (CLARITIN) 10 MG tablet Take 10 mg by mouth at bedtime.     Marland Kitchen losartan (COZAAR) 25 MG tablet Take 1 tablet (25 mg total) by mouth 2 (two) times daily. 60 tablet 5  . LYCOPENE PO Take 1 capsule by mouth 2 (two) times daily. JARROW FORMULA LYCO-SORB PROSTATE SUPPORT     . metoprolol succinate (TOPROL-XL) 25 MG 24 hr tablet Take 1 tablet (25 mg total)  by mouth daily. 90 tablet 3  . Multiple Vitamins-Minerals (OCUVITE PRESERVISION PO) Take 1 tablet by mouth 2 (two) times daily.     . Omega-3 Fatty Acids (FISH OIL PO) Take 1,000 mg by mouth daily.     . rosuvastatin (CRESTOR) 20 MG tablet Take 20 mg by mouth daily.     No current facility-administered medications for this visit.    Allergies  Allergen Reactions  . Statins Other (See Comments)    Myalgias (intolerance)     Review of Systems: All systems reviewed and negative except where noted in HPI.   Lab Results  Component Value Date   WBC 5.3 07/14/2017   HGB 14.0 07/14/2017   HCT 41.6 07/14/2017   MCV 83.2 07/14/2017   PLT 256.0 07/14/2017    Lab Results  Component Value Date   CREATININE 1.05 07/05/2017   BUN 16 07/05/2017   NA 142 07/05/2017   K 3.9 07/05/2017   CL 102 07/05/2017   CO2 29 07/05/2017   Lab Results  Component Value Date   ALT 18 02/21/2017   AST 22 02/21/2017   ALKPHOS 47 02/21/2017   BILITOT 0.7 02/21/2017     Physical Exam: BP 118/72   Pulse 68   Ht 5\' 10"  (1.778 m)   Wt 183 lb (83 kg)   BMI 26.26 kg/m  Constitutional: Pleasant,well-developed, male in no acute distress. HEENT: Normocephalic and atraumatic. Conjunctivae are normal. No scleral icterus. Neck supple.  Cardiovascular: Normal rate, regular rhythm.  Pulmonary/chest: Effort normal and breath sounds normal. No wheezing, rales or rhonchi. Abdominal: Soft, nondistended, nontender. There are no masses palpable. No hepatomegaly. Extremities: no edema Lymphadenopathy: No cervical adenopathy noted. Neurological: Alert and oriented to person place and time. Skin: Skin is warm and dry. No rashes noted. Psychiatric: Normal mood and affect. Behavior is normal.   ASSESSMENT AND PLAN: 77 year old male here for a follow-up visit to discuss the following issues:  History of colon cancer / anticoagulated / atrial fibrillation - he had a stroke about a year ago, currently on Eliquis  without recurrence of any neurologic events. We discussed at his age if he wanted to have any further surveillance colonoscopies. Given his history of colon cancer he does think he wants to have one more colonoscopy, the question is the timing of it. He is currently awaiting a procedure for nerve impingement on his right arm, he wants  to address that first, and allow more time to grieve the loss of his wife prior to other invasive testing. He is thinking of perhaps doing this colonoscopy in January, after discussion of risks and benefits of it. I asked him to call me back when he wants to proceed. He would need to hold his Eliquis for 1-2 days before the procedure and would need clearance from his prescribing provider to do so. All questions answered, he agreed with the plan.   [place] Cellar, MD [facility]

## 2017-11-21 NOTE — Patient Instructions (Signed)
If you are age 79 or older, your body mass index should be between 23-30. Your Body mass index is 26.26 kg/m. If this is out of the aforementioned range listed, please consider follow up with your Primary Care Provider.  If you are age 50 or younger, your body mass index should be between 19-25. Your Body mass index is 26.26 kg/m. If this is out of the aformentioned range listed, please consider follow up with your Primary Care Provider  It has been recommended to you by your physician that you have a(n) colonoscopy completed. Per your request, we did not schedule the procedure(s) today. Please contact our office at [phone] should you decide to have the procedure completed.  Thank you for entrusting me with your care and for choosing [facility], Dr. [place] Cellar

## 2017-11-28 ENCOUNTER — Telehealth: Payer: Self-pay | Admitting: Internal Medicine

## 2017-11-28 DIAGNOSIS — G5621 Lesion of ulnar nerve, right upper limb: Secondary | ICD-10-CM | POA: Diagnosis not present

## 2017-11-28 DIAGNOSIS — G5601 Carpal tunnel syndrome, right upper limb: Secondary | ICD-10-CM | POA: Diagnosis not present

## 2017-11-28 NOTE — Telephone Encounter (Signed)
1) Spoke with patient and he wanted to know when Dr Debara Pickett recommended going back on Eliquis. He had surgery today at 12 noon. Explained to patient that is generally left up to surgeon to decide. Surgeon recommended resuming but patient stated Dr Debara Pickett did not want to hold as long as surgeon in past secondary to his history.   2) Patient also wanted to know if ok to take Hydrocodone with Eluqis  Will forward to Dr Debara Pickett for review

## 2017-11-28 NOTE — Telephone Encounter (Signed)
° °  Patient calling with questions about resuming Eliquis, had surgery today. Advised patient to also contact Ortho office with pain management questions. Patient requesting recommendation for Dr Debara Pickett.

## 2017-11-28 NOTE — Telephone Encounter (Signed)
Spoke with Clarnce Flock RN with Dr Debara Pickett and he had already left. Advised patient to resume as instructed by surgeon and ok to take Hydrocodone with Eliquis. Discussed with DNP Verdell Carmine and she agreed.

## 2017-11-29 ENCOUNTER — Ambulatory Visit: Payer: PPO | Admitting: Neurology

## 2017-11-29 NOTE — Telephone Encounter (Signed)
Thanks - usually 24-48 hours after surgery.  Dr. Lemmie Evens

## 2017-12-02 ENCOUNTER — Inpatient Hospital Stay (HOSPITAL_BASED_OUTPATIENT_CLINIC_OR_DEPARTMENT_OTHER)
Admission: EM | Admit: 2017-12-02 | Discharge: 2017-12-04 | DRG: 066 | Disposition: A | Discharge status: Home / Self Care | Payer: PPO | Attending: Internal Medicine | Admitting: Internal Medicine

## 2017-12-02 ENCOUNTER — Emergency Department (HOSPITAL_BASED_OUTPATIENT_CLINIC_OR_DEPARTMENT_OTHER): Payer: PPO

## 2017-12-02 ENCOUNTER — Encounter (HOSPITAL_BASED_OUTPATIENT_CLINIC_OR_DEPARTMENT_OTHER): Payer: Self-pay | Admitting: *Deleted

## 2017-12-02 DIAGNOSIS — Z8673 Personal history of transient ischemic attack (TIA), and cerebral infarction without residual deficits: Secondary | ICD-10-CM

## 2017-12-02 DIAGNOSIS — Z888 Allergy status to other drugs, medicaments and biological substances status: Secondary | ICD-10-CM | POA: Diagnosis not present

## 2017-12-02 DIAGNOSIS — R269 Unspecified abnormalities of gait and mobility: Secondary | ICD-10-CM | POA: Diagnosis not present

## 2017-12-02 DIAGNOSIS — G629 Polyneuropathy, unspecified: Secondary | ICD-10-CM | POA: Diagnosis present

## 2017-12-02 DIAGNOSIS — N400 Enlarged prostate without lower urinary tract symptoms: Secondary | ICD-10-CM | POA: Diagnosis present

## 2017-12-02 DIAGNOSIS — Z7901 Long term (current) use of anticoagulants: Secondary | ICD-10-CM | POA: Diagnosis not present

## 2017-12-02 DIAGNOSIS — I10 Essential (primary) hypertension: Secondary | ICD-10-CM | POA: Diagnosis present

## 2017-12-02 DIAGNOSIS — G459 Transient cerebral ischemic attack, unspecified: Secondary | ICD-10-CM | POA: Diagnosis not present

## 2017-12-02 DIAGNOSIS — Z85038 Personal history of other malignant neoplasm of large intestine: Secondary | ICD-10-CM

## 2017-12-02 DIAGNOSIS — E785 Hyperlipidemia, unspecified: Secondary | ICD-10-CM | POA: Diagnosis present

## 2017-12-02 DIAGNOSIS — M1990 Unspecified osteoarthritis, unspecified site: Secondary | ICD-10-CM | POA: Diagnosis present

## 2017-12-02 DIAGNOSIS — Z79899 Other long term (current) drug therapy: Secondary | ICD-10-CM | POA: Diagnosis not present

## 2017-12-02 DIAGNOSIS — R202 Paresthesia of skin: Secondary | ICD-10-CM | POA: Diagnosis present

## 2017-12-02 DIAGNOSIS — I480 Paroxysmal atrial fibrillation: Secondary | ICD-10-CM | POA: Diagnosis present

## 2017-12-02 DIAGNOSIS — R29700 NIHSS score 0: Secondary | ICD-10-CM | POA: Diagnosis not present

## 2017-12-02 DIAGNOSIS — I5030 Unspecified diastolic (congestive) heart failure: Secondary | ICD-10-CM | POA: Diagnosis not present

## 2017-12-02 DIAGNOSIS — I6389 Other cerebral infarction: Secondary | ICD-10-CM | POA: Diagnosis not present

## 2017-12-02 DIAGNOSIS — Z87891 Personal history of nicotine dependence: Secondary | ICD-10-CM

## 2017-12-02 DIAGNOSIS — F419 Anxiety disorder, unspecified: Secondary | ICD-10-CM | POA: Diagnosis present

## 2017-12-02 DIAGNOSIS — I63432 Cerebral infarction due to embolism of left posterior cerebral artery: Secondary | ICD-10-CM | POA: Diagnosis not present

## 2017-12-02 NOTE — ED Triage Notes (Addendum)
Pt o right arm numbness from right elbow to right hand, post op carpal tunnel x 5 days ago, pt also states he had an unsteady gait

## 2017-12-03 ENCOUNTER — Observation Stay (HOSPITAL_BASED_OUTPATIENT_CLINIC_OR_DEPARTMENT_OTHER): Payer: PPO

## 2017-12-03 ENCOUNTER — Observation Stay (HOSPITAL_COMMUNITY): Payer: PPO

## 2017-12-03 ENCOUNTER — Encounter (HOSPITAL_BASED_OUTPATIENT_CLINIC_OR_DEPARTMENT_OTHER): Payer: Self-pay | Admitting: Emergency Medicine

## 2017-12-03 ENCOUNTER — Other Ambulatory Visit: Payer: Self-pay

## 2017-12-03 DIAGNOSIS — Z7901 Long term (current) use of anticoagulants: Secondary | ICD-10-CM | POA: Diagnosis not present

## 2017-12-03 DIAGNOSIS — Z888 Allergy status to other drugs, medicaments and biological substances status: Secondary | ICD-10-CM | POA: Diagnosis not present

## 2017-12-03 DIAGNOSIS — E785 Hyperlipidemia, unspecified: Secondary | ICD-10-CM | POA: Diagnosis present

## 2017-12-03 DIAGNOSIS — I10 Essential (primary) hypertension: Secondary | ICD-10-CM | POA: Diagnosis present

## 2017-12-03 DIAGNOSIS — G629 Polyneuropathy, unspecified: Secondary | ICD-10-CM | POA: Diagnosis present

## 2017-12-03 DIAGNOSIS — I63432 Cerebral infarction due to embolism of left posterior cerebral artery: Secondary | ICD-10-CM | POA: Diagnosis present

## 2017-12-03 DIAGNOSIS — Z8673 Personal history of transient ischemic attack (TIA), and cerebral infarction without residual deficits: Secondary | ICD-10-CM | POA: Diagnosis not present

## 2017-12-03 DIAGNOSIS — I6389 Other cerebral infarction: Secondary | ICD-10-CM | POA: Diagnosis not present

## 2017-12-03 DIAGNOSIS — Z85038 Personal history of other malignant neoplasm of large intestine: Secondary | ICD-10-CM | POA: Diagnosis not present

## 2017-12-03 DIAGNOSIS — R202 Paresthesia of skin: Secondary | ICD-10-CM

## 2017-12-03 DIAGNOSIS — I480 Paroxysmal atrial fibrillation: Secondary | ICD-10-CM | POA: Diagnosis present

## 2017-12-03 DIAGNOSIS — F419 Anxiety disorder, unspecified: Secondary | ICD-10-CM | POA: Diagnosis present

## 2017-12-03 DIAGNOSIS — Z87891 Personal history of nicotine dependence: Secondary | ICD-10-CM | POA: Diagnosis not present

## 2017-12-03 DIAGNOSIS — G459 Transient cerebral ischemic attack, unspecified: Secondary | ICD-10-CM

## 2017-12-03 DIAGNOSIS — N400 Enlarged prostate without lower urinary tract symptoms: Secondary | ICD-10-CM | POA: Diagnosis present

## 2017-12-03 DIAGNOSIS — I5030 Unspecified diastolic (congestive) heart failure: Secondary | ICD-10-CM

## 2017-12-03 DIAGNOSIS — R29700 NIHSS score 0: Secondary | ICD-10-CM | POA: Diagnosis present

## 2017-12-03 DIAGNOSIS — Z79899 Other long term (current) drug therapy: Secondary | ICD-10-CM | POA: Diagnosis not present

## 2017-12-03 DIAGNOSIS — M1990 Unspecified osteoarthritis, unspecified site: Secondary | ICD-10-CM | POA: Diagnosis present

## 2017-12-03 LAB — CBC WITH DIFFERENTIAL/PLATELET
Abs Immature Granulocytes: 0.01 10*3/uL (ref 0.00–0.07)
Basophils Absolute: 0.1 10*3/uL (ref 0.0–0.1)
Basophils Relative: 1 %
EOS ABS: 0.3 10*3/uL (ref 0.0–0.5)
EOS PCT: 4 %
HEMATOCRIT: 42.8 % (ref 39.0–52.0)
HEMOGLOBIN: 14 g/dL (ref 13.0–17.0)
Immature Granulocytes: 0 %
LYMPHS ABS: 1.6 10*3/uL (ref 0.7–4.0)
LYMPHS PCT: 28 %
MCH: 28.3 pg (ref 26.0–34.0)
MCHC: 32.7 g/dL (ref 30.0–36.0)
MCV: 86.5 fL (ref 80.0–100.0)
MONO ABS: 0.5 10*3/uL (ref 0.1–1.0)
Monocytes Relative: 8 %
Neutro Abs: 3.3 10*3/uL (ref 1.7–7.7)
Neutrophils Relative %: 59 %
Platelets: 179 10*3/uL (ref 150–400)
RBC: 4.95 MIL/uL (ref 4.22–5.81)
RDW: 12.6 % (ref 11.5–15.5)
WBC: 5.7 10*3/uL (ref 4.0–10.5)
nRBC: 0 % (ref 0.0–0.2)

## 2017-12-03 LAB — URINALYSIS, ROUTINE W REFLEX MICROSCOPIC
Bilirubin Urine: NEGATIVE
GLUCOSE, UA: NEGATIVE mg/dL
Hgb urine dipstick: NEGATIVE
KETONES UR: NEGATIVE mg/dL
Leukocytes, UA: NEGATIVE
Nitrite: NEGATIVE
PH: 7.5 (ref 5.0–8.0)
Protein, ur: NEGATIVE mg/dL
SPECIFIC GRAVITY, URINE: 1.01 (ref 1.005–1.030)

## 2017-12-03 LAB — BASIC METABOLIC PANEL
Anion gap: 9 (ref 5–15)
BUN: 22 mg/dL (ref 8–23)
CHLORIDE: 103 mmol/L (ref 98–111)
CO2: 28 mmol/L (ref 22–32)
CREATININE: 1.01 mg/dL (ref 0.61–1.24)
Calcium: 9.2 mg/dL (ref 8.9–10.3)
GFR calc Af Amer: >60 mL/min (ref >60)
GFR calc non Af Amer: >60 mL/min (ref >60)
GLUCOSE: 121 mg/dL — AB (ref 70–99)
Potassium: 3.7 mmol/L (ref 3.5–5.1)
Sodium: 140 mmol/L (ref 135–145)

## 2017-12-03 LAB — ECHOCARDIOGRAM COMPLETE
Height: 70 in
WEIGHTICAEL: 2948.87 [oz_av]

## 2017-12-03 LAB — TSH: TSH: 1.85 u[IU]/mL (ref 0.350–4.500)

## 2017-12-03 LAB — TROPONIN I: Troponin I: <0.03 ng/mL (ref <0.03)

## 2017-12-03 MED ORDER — LOSARTAN POTASSIUM 25 MG PO TABS
25.0000 mg | ORAL_TABLET | Freq: Two times a day (BID) | ORAL | Status: DC
  Administered 2017-12-03 – 2017-12-04 (×3): 25 mg via ORAL
  Filled 2017-12-03 (×3): qty 1

## 2017-12-03 MED ORDER — ALPRAZOLAM 0.25 MG PO TABS: 0.2500 mg | ORAL_TABLET | Freq: Two times a day (BID) | ORAL | Status: DC | PRN

## 2017-12-03 MED ORDER — APIXABAN 5 MG PO TABS
5.0000 mg | ORAL_TABLET | Freq: Two times a day (BID) | ORAL | Status: DC
  Administered 2017-12-03 – 2017-12-04 (×3): 5 mg via ORAL
  Filled 2017-12-03 (×3): qty 1

## 2017-12-03 MED ORDER — GABAPENTIN 300 MG PO CAPS
300.0000 mg | ORAL_CAPSULE | Freq: Three times a day (TID) | ORAL | Status: DC | PRN
  Administered 2017-12-03 (×2): 300 mg via ORAL
  Filled 2017-12-03 (×3): qty 1

## 2017-12-03 MED ORDER — METOPROLOL SUCCINATE ER 25 MG PO TB24
25.0000 mg | ORAL_TABLET | Freq: Every day | ORAL | Status: DC
  Administered 2017-12-03 – 2017-12-04 (×2): 25 mg via ORAL
  Filled 2017-12-03 (×2): qty 1

## 2017-12-03 MED ORDER — STROKE: EARLY STAGES OF RECOVERY BOOK
Freq: Once | Status: AC
  Administered 2017-12-03: 06:00:00

## 2017-12-03 MED ORDER — HYDROCODONE-ACETAMINOPHEN 5-325 MG PO TABS
1.0000 | ORAL_TABLET | Freq: Four times a day (QID) | ORAL | Status: DC | PRN
  Administered 2017-12-03 – 2017-12-04 (×3): 1 via ORAL
  Filled 2017-12-03 (×3): qty 1

## 2017-12-03 MED ORDER — EZETIMIBE 10 MG PO TABS
10.0000 mg | ORAL_TABLET | Freq: Every day | ORAL | Status: DC
  Administered 2017-12-03: 10 mg via ORAL
  Filled 2017-12-03: qty 1

## 2017-12-03 MED ORDER — ROSUVASTATIN CALCIUM 20 MG PO TABS
20.0000 mg | ORAL_TABLET | Freq: Every day | ORAL | Status: DC
  Administered 2017-12-03: 20 mg via ORAL
  Filled 2017-12-03: qty 1

## 2017-12-03 NOTE — Progress Notes (Signed)
OT Evaluation  PTA, pt living at home alone and was independent with ADL and mobility. Pt's wife recently passed away in September 12, 2022 and his sister passed in August. Pt states he is "depressed" with his loss and now he has "had another stroke".  Pt mobilizing with S during ADL without any LOB. Pt states he feels he is "getting better a the day goes on". Pt demonstrates significant deficits with memory. Pt will need supervision with all medication and financial management. Feel pt will be appropirate to DC home with [facility] and initial 24/7 S if available.  Will follow acutely to facilitate safe DC home.     12/03/17 1500  OT Visit Information  Last OT Received On 12/03/17  Assistance Needed +1  History of Present Illness Pt is a 77 y/o male admitted secondary to R arm numbness. MRI revealed Acute infarct left hippocampus, occlusion left posterior cerebral artery which could be acute. PMH including but not limited to a-fib, colon cancer, HTN, stroke.  Precautions  Precautions Fall  Restrictions  Weight Bearing Restrictions No  Home Living  Family/patient expects to be discharged to: Private residence  Living Arrangements Alone  Available Help at Discharge Family;Available PRN/intermittently  Type of Home House  Home Access Stairs to enter  Entrance Stairs-Number of Steps 4  Entrance Stairs-Rails Right;Left  Home Layout Two level  Alternate Level Stairs-Number of Steps flight  Alternate Level Stairs-Rails Right;Left  Bathroom Shower/Tub Walk-in shower;Tub/shower Restaurant manager, fast food Yes  How Accessible Accessible via walker  Home Equipment None  Prior Function  Level of Independence Independent  Comments drives  Communication  Communication No difficulties  Pain Assessment  Pain Assessment No/denies pain  Cognition  Arousal/Alertness Awake/alert  Behavior During Therapy WFL for tasks assessed/performed  Overall Cognitive Status Impaired/Different from  baseline  Area of Impairment Memory;Safety/judgement;Problem solving;Attention;Awareness  Current Attention Level Selective  Memory Decreased short-term memory  Following Commands Follows one step commands consistently  Safety/Judgement Decreased awareness of deficits;Decreased awareness of safety  Awareness Emergent  Problem Solving Slow processing  Upper Extremity Assessment  Upper Extremity Assessment RUE deficits/detail  RUE Deficits / Details CTR surgery 11/28/17; numbness from carpal tunnel. Pt reports previous RUE numbness from previous stroke. States he feels his arm is getting close to baseline  RUE Coordination decreased gross motor;decreased fine motor  Lower Extremity Assessment  Lower Extremity Assessment Defer to PT evaluation  Cervical / Trunk Assessment  Cervical / Trunk Assessment Normal  ADL  Overall ADL's  Needs assistance/impaired  Eating/Feeding Modified independent  Grooming Set up;Standing  Upper Body Bathing Set up;Sitting  Lower Body Bathing Supervison/ safety;Sit to/from stand  Upper Body Dressing  Supervision/safety;Sitting  Lower Body Dressing Supervision/safety;Sit to/from Arboriculturist;Ambulation  Toileting- Clothing Manipulation and Hygiene Modified independent  Functional mobility during ADLs Supervision/safety  Vision- History  Baseline Vision/History Wears glasses  Wears Glasses At all times  Patient Visual Report No change from baseline  Vision- Assessment  Vision Assessment? Yes  Eye Alignment WFL  Ocular Range of Motion [facility]  Alignment/Gaze Preference WDL  Tracking/Visual Pursuits Able to track stimulus in all quads without difficulty  Saccades WFL  Convergence WFL  Visual Fields No apparent deficits  Perception  Comments WFL  Praxis  Praxis tested? WFL  Bed Mobility  Overal bed mobility Modified Independent  Transfers  Overall transfer level Needs assistance  Equipment used None  Transfers Sit to/from  Stand  Sit to Stand Supervision  Balance  Overall balance assessment Needs assistance  Sitting-balance support Feet supported  Sitting balance-Leahy Scale Good  Standing balance support During functional activity;No upper extremity supported  Standing balance-Leahy Scale Fair  OT - End of Session  Equipment Utilized During Treatment Gait belt  Activity Tolerance Patient tolerated treatment well  Patient left in chair;with call bell/phone within reach  Nurse Communication Mobility status  OT Assessment  OT Recommendation/Assessment Patient needs continued OT Services  OT Visit Diagnosis Unsteadiness on feet (R26.81);Other symptoms and signs involving cognitive function  OT Problem List Impaired balance (sitting and/or standing);Decreased cognition;Decreased safety awareness;Impaired UE functional use  OT Plan  OT Frequency (ACUTE ONLY) Min 3X/week  OT Treatment/Interventions (ACUTE ONLY) Self-care/ADL training;DME and/or AE instruction;Therapeutic activities;Cognitive remediation/compensation;Patient/family education;Balance training  AM-PAC OT "6 Clicks" Daily Activity Outcome Measure  Help from another person eating meals? 4  Help from another person taking care of personal grooming? 4  Help from another person toileting, which includes using toliet, bedpan, or urinal? 4  Help from another person bathing (including washing, rinsing, drying)? 4  Help from another person to put on and taking off regular upper body clothing? 4  Help from another person to put on and taking off regular lower body clothing? 4  6 Click Score 24  ADL G Code Conversion CH  OT Recommendation  Follow Up Recommendations Home health OT;Supervision/Assistance - 24 hour (initially then intermittent)  OT Equipment None recommended by OT  Individuals Consulted  Consulted and Agree with Results and Recommendations Patient  Acute Rehab OT Goals  Patient Stated Goal return home  OT Goal Formulation With patient   Time For Goal Achievement 12/17/17  Potential to Achieve Goals Good  OT Time Calculation  OT Start Time (ACUTE ONLY) 1525  OT Stop Time (ACUTE ONLY) 1550  OT Time Calculation (min) 25 min  OT General Charges  $OT Visit 1 Visit  OT Evaluation  $OT Eval Moderate Complexity 1 Mod  OT Treatments  $Self Care/Home Management  8-22 mins  Written Expression  Dominant Hand Right  Maurie Boettcher, OT/L   Acute OT Clinical Specialist [place] Pager [phone] Office [phone]

## 2017-12-03 NOTE — Progress Notes (Signed)
Rehab Admissions Coordinator Note:  Per PT Recommendation, Patient was screened by Jhonnie Garner for appropriateness for an Inpatient Acute Rehab Consult.  At this time, it does not appear that pt will require intensive CIR program. Per most recent therapy note, pt is now Supervision for transfers and mobility during ADL completion.   Please place new screen request if there is a functional decline.   Jhonnie Garner 12/03/2017, 4:13 PM  I can be reached at [phone].

## 2017-12-03 NOTE — ED Provider Notes (Signed)
[facility] EMERGENCY DEPARTMENT Provider Note   CSN: [id number] Arrival date & time: 12/02/17  2331     History   Chief Complaint Chief Complaint  Patient presents with  . Numbness    HPI Kevin Bass is a 77 y.o. male.  The history is provided by the patient.  Illness  This is a new problem. The current episode started 1 to 2 hours ago. The problem occurs constantly. The problem has been resolved. Pertinent negatives include no chest pain, no abdominal pain, no headaches and no shortness of breath. Nothing relieves the symptoms. He has tried nothing for the symptoms. The treatment provided significant relief.  Patient with a h/o of CVA and AFIB who was off his Eliquis earlier in the week for ulnar nerve release and carpal tunnel surgery presents with numbness in the entirety of the RUE (above the surgical site) and slowness to respond as well as being a bit off balance.  No HA, no swelling, no drainage.  No f/c/r.    Past Medical History:  Diagnosis Date  . Allergy   . Anxiety   . Arthritis    hands  . Atrial fibrillation ([place])   . Colon cancer [facility])    s/p partial colectomy 1990, Cscope neg 7-09, next 2014  . DISORDER, MITRAL VALVE    MV recontruction at [place] 2006----still needs ABX for SBE prophylaxis   . Elevated PSA    [name]  . GERD (gastroesophageal reflux disease)   . Heart murmur    no problems since surgery  . HYPERGLYCEMIA, BORDERLINE 10/16/2008  . HYPERLIPIDEMIA 06/15/2006   diet controlled  . Hypertension   . HYPERTROPHY PROSTATE W/O UR OBST & OTH LUTS 10/16/2008  . Neuropathy   . Right groin pain    Dr. Redmond Pulling  . SCC (squamous cell carcinoma) 06/2017   s/p Mohs---[name]  . Stroke ([place])   . Wears partial dentures    lower partial    Patient Active Problem List   Diagnosis Date Noted  . TIA (transient ischemic attack) 12/03/2017  . Paroxysmal atrial fibrillation ([place]) 07/11/2017  . SCC (squamous cell carcinoma) 06/22/2017  .  Varicose veins of both lower extremities 06/19/2017  . Bilateral lower extremity edema 06/19/2017  . Paresthesias 03/03/2017  . Trigeminal neuralgia of right side of face 10/01/2016  . History of mitral valve repair 09/30/2016  . Cerebrovascular accident (CVA) ([place]) 09/12/2016  . GERD (gastroesophageal reflux disease) 09/22/2015  . PCP NOTES >>>>> 11/08/2014  . Chest pain 06/07/2014  . Facial neuropathy 04/02/2013  . Anxiety 02/26/2013  . Annual physical exam 08/30/2012  . HTN (hypertension) 01/22/2011  . HYPERTROPHY PROSTATE W/O UR OBST & OTH LUTS 10/16/2008  . Hyperglycemia 10/16/2008  . Hyperlipidemia 06/15/2006  . Mitral valve disease 06/15/2006  . ALLERGIC RHINITIS 06/15/2006  . COLON CANCER, HX OF 06/15/2006    Past Surgical History:  Procedure Laterality Date  . COLECTOMY  1990  . COLONOSCOPY    . HERNIA REPAIR Left 2014   [facility] rep w/mesh- March 2014  . LOOP RECORDER INSERTION N/A 12/01/2016   Procedure: LOOP RECORDER INSERTION;  Surgeon: Evans Lance, MD;  Location: [place] CV LAB;  Service: Cardiovascular;  Laterality: N/A;  . MITRAL VALVE ANNULOPLASTY     [facility] 2006  . TEE WITHOUT CARDIOVERSION N/A 10/05/2016   Procedure: TRANSESOPHAGEAL ECHOCARDIOGRAM (TEE);  Surgeon: [name], MD;  Location: [facility];  Service: Cardiovascular;  Laterality: N/A;  Home Medications    Prior to Admission medications   Medication Sig Start Date End Date Taking? Authorizing Provider  gabapentin (NEURONTIN) 300 MG capsule Take 300 mg by mouth 3 (three) times daily as needed.   Yes [provider]  HYDROcodone-acetaminophen (NORCO/VICODIN) 5-325 MG tablet Take 1 tablet by mouth every 6 (six) hours as needed for moderate pain.   Yes [provider]  ALPRAZolam (XANAX) 0.25 MG tablet TAKE ONE OR TWO TABLETS BY MOUTH TWICE DAILY AS NEEDED FOR ANXIETY  08/29/17   Colon Branch, MD  Arginine 500 MG CAPS Take 1,500 mg by mouth daily.    [provider]  ELIQUIS 5 MG TABS tablet TAKE ONE TABLET BY MOUTH TWICE DAILY  06/29/17   Hilty, Nadean Corwin, MD  ezetimibe (ZETIA) 10 MG tablet Take 1 tablet (10 mg total) by mouth daily. 08/26/17   Colon Branch, MD  Glucosamine-MSM-Hyaluronic Acd (JOINT HEALTH PO) Take 1 tablet by mouth daily. [facility]    [provider]  loratadine (CLARITIN) 10 MG tablet Take 10 mg by mouth at bedtime.     [provider]  losartan (COZAAR) 25 MG tablet Take 1 tablet (25 mg total) by mouth 2 (two) times daily. 09/26/17   Colon Branch, MD  LYCOPENE PO Take 1 capsule by mouth 2 (two) times daily. [place] FORMULA LYCO-SORB PROSTATE SUPPORT     [provider]  metoprolol succinate (TOPROL-XL) 25 MG 24 hr tablet Take 1 tablet (25 mg total) by mouth daily. 07/15/17   Hilty, Nadean Corwin, MD  Multiple Vitamins-Minerals (OCUVITE PRESERVISION PO) Take 1 tablet by mouth 2 (two) times daily.     [provider]  Omega-3 Fatty Acids (FISH OIL PO) Take 1,000 mg by mouth daily.     [provider]  rosuvastatin (CRESTOR) 20 MG tablet Take 20 mg by mouth daily.    [provider]    Family History Family History  Problem Relation Age of Onset  . Colon cancer Other        M side (cousins)  . Diabetes Neg Hx   . Stroke Neg Hx   . CAD Neg Hx   . Prostate cancer Neg Hx   . Rectal cancer Neg Hx   . Esophageal cancer Neg Hx     Social History Social History   Tobacco Use  . Smoking status: Former Smoker    Packs/day: 0.50    Years: 20.00    Pack years: 10.00    Types: Cigarettes    Last attempt to quit: 02/23/1983    Years since quitting: 34.8  . Smokeless tobacco: Never Used  Substance Use Topics  . Alcohol use: Yes    Alcohol/week: 7.0 standard drinks    Types: 7 Glasses of wine per week  . Drug use: No     Allergies   Statins   Review of Systems Review of Systems  Eyes: Negative for visual disturbance.  Respiratory: Negative for shortness of  breath.   Cardiovascular: Negative for chest pain.  Gastrointestinal: Negative for abdominal pain.  Neurological: Positive for numbness. Negative for headaches.  All other systems reviewed and are negative.    Physical Exam Updated Vital Signs BP 125/72   Pulse (!) 58   Resp 16   Ht 5\' 10"  (1.778 m)   Wt 86 kg   SpO2 94%   BMI 27.20 kg/m   Physical Exam  Constitutional: He is oriented to person, place,  and time. He appears well-developed and well-nourished. No distress.  HENT:  Head: Normocephalic and atraumatic.  Mouth/Throat: No oropharyngeal exudate.  Eyes: Pupils are equal, round, and reactive to light. Conjunctivae and EOM are normal.  Neck: Normal range of motion. Neck supple. No JVD present.  Cardiovascular: Normal rate, regular rhythm, normal heart sounds and intact distal pulses.  Pulmonary/Chest: Effort normal and breath sounds normal. No stridor. He has no wheezes. He has no rales.  Abdominal: Soft. Bowel sounds are normal. He exhibits no mass. There is no tenderness. There is no guarding.  Musculoskeletal: Normal range of motion.  Neurological: He is alert and oriented to person, place, and time. He displays normal reflexes.  Slow to answer question  Skin: Skin is warm and dry. Capillary refill takes less than 2 seconds.  Medial epicondyle incision CDI, wrist incision CDI.  All compartments soft FROM of RUE, neurovascularly intact 3+ radial pulse RUE     ED Treatments / Results  Labs (all labs ordered are listed, but only abnormal results are displayed) Results for orders placed or performed during the hospital encounter of 12/02/17  CBC with Differential/Platelet  Result Value Ref Range   WBC 5.7 4.0 - 10.5 K/uL   RBC 4.95 4.22 - 5.81 MIL/uL   Hemoglobin 14.0 13.0 - 17.0 g/dL   HCT 42.8 39.0 - 52.0 %   MCV 86.5 80.0 - 100.0 fL   MCH 28.3 26.0 - 34.0 pg   MCHC 32.7 30.0 - 36.0 g/dL   RDW 12.6 11.5 - 15.5 %   Platelets 179 150 - 400 K/uL   nRBC 0.0 0.0 -  0.2 %   Neutrophils Relative % 59 %   Neutro Abs 3.3 1.7 - 7.7 K/uL   Lymphocytes Relative 28 %   Lymphs Abs 1.6 0.7 - 4.0 K/uL   Monocytes Relative 8 %   Monocytes Absolute 0.5 0.1 - 1.0 K/uL   Eosinophils Relative 4 %   Eosinophils Absolute 0.3 0.0 - 0.5 K/uL   Basophils Relative 1 %   Basophils Absolute 0.1 0.0 - 0.1 K/uL   Immature Granulocytes 0 %   Abs Immature Granulocytes 0.01 0.00 - 0.07 K/uL  Basic metabolic panel  Result Value Ref Range   Sodium 140 135 - 145 mmol/L   Potassium 3.7 3.5 - 5.1 mmol/L   Chloride 103 98 - 111 mmol/L   CO2 28 22 - 32 mmol/L   Glucose, Bld 121 (H) 70 - 99 mg/dL   BUN 22 8 - 23 mg/dL   Creatinine, Ser 1.01 0.61 - 1.24 mg/dL   Calcium 9.2 8.9 - 10.3 mg/dL   GFR calc non Af Amer >60 >60 mL/min   GFR calc Af Amer >60 >60 mL/min   Anion gap 9 5 - 15  Urinalysis, Routine w reflex microscopic  Result Value Ref Range   Color, Urine YELLOW YELLOW   APPearance CLEAR CLEAR   Specific Gravity, Urine 1.010 1.005 - 1.030   pH 7.5 5.0 - 8.0   Glucose, UA NEGATIVE NEGATIVE mg/dL   Hgb urine dipstick NEGATIVE NEGATIVE   Bilirubin Urine NEGATIVE NEGATIVE   Ketones, ur NEGATIVE NEGATIVE mg/dL   Protein, ur NEGATIVE NEGATIVE mg/dL   Nitrite NEGATIVE NEGATIVE   Leukocytes, UA NEGATIVE NEGATIVE  Troponin I  Result Value Ref Range   Troponin I <0.03 <0.03 ng/mL   Ct Head Wo Contrast  Result Date: 12/03/2017 CLINICAL DATA:  77 year old male with unsteadiness in his gait. Right arm numbness. Prior history  of strokes. EXAM: CT HEAD WITHOUT CONTRAST TECHNIQUE: Contiguous axial images were obtained from the base of the skull through the vertex without intravenous contrast. COMPARISON:  CT the abdomen and pelvis 02/21/2017. FINDINGS: Brain: Again noted are some well-defined areas of low attenuation in the right cerebellar hemisphere related to remote right PICA territory infarction. No evidence of acute infarction, hemorrhage, hydrocephalus, extra-axial  collection or mass lesion/mass effect. Vascular: No hyperdense vessel or unexpected calcification. Skull: Normal. Negative for fracture or focal lesion. Sinuses/Orbits: No acute finding. Other: None. IMPRESSION: 1. No acute intracranial abnormalities. 2. Old right cerebellar hemisphere infarct redemonstrated. Electronically Signed   By: Vinnie Langton M.D.   On: 12/03/2017 00:55    EKG EKG Interpretation  Date/Time:  Friday December 02 2017 23:59:23 EDT Ventricular Rate:  66 PR Interval:    QRS Duration: 95 QT Interval:  398 QTC Calculation: 417 R Axis:   -25 Text Interpretation:  Sinus rhythm Borderline left axis deviation Confirmed by Randal Buba, Ladanian Kelter ([id number]) on 12/03/2017 12:19:58 AM   Radiology Ct Head Wo Contrast  Result Date: 12/03/2017 CLINICAL DATA:  77 year old male with unsteadiness in his gait. Right arm numbness. Prior history of strokes. EXAM: CT HEAD WITHOUT CONTRAST TECHNIQUE: Contiguous axial images were obtained from the base of the skull through the vertex without intravenous contrast. COMPARISON:  CT the abdomen and pelvis 02/21/2017. FINDINGS: Brain: Again noted are some well-defined areas of low attenuation in the right cerebellar hemisphere related to remote right PICA territory infarction. No evidence of acute infarction, hemorrhage, hydrocephalus, extra-axial collection or mass lesion/mass effect. Vascular: No hyperdense vessel or unexpected calcification. Skull: Normal. Negative for fracture or focal lesion. Sinuses/Orbits: No acute finding. Other: None. IMPRESSION: 1. No acute intracranial abnormalities. 2. Old right cerebellar hemisphere infarct redemonstrated. Electronically Signed   By: Vinnie Langton M.D.   On: 12/03/2017 00:55    Procedures Procedures (including critical care time)  Medications Ordered in ED Medications - No data to display  Case d/w Dr. Lorraine Lax of neurology    Final Clinical Impressions(s) / ED Diagnoses   Final diagnoses:  TIA  (transient ischemic attack)    Per Dr. Lorraine Lax, admit to medicine for work up    [facility], Kambra Beachem, MD 12/03/17 0383

## 2017-12-03 NOTE — H&P (Signed)
History and Physical    Santonio Speakman FTD:[id number] DOB: [birth year] DOA: 12/02/2017  Referring MD/NP/PA: Cleon Gustin, MD PCP: Colon Branch, MD  Patient coming from: [facility] transfer  Chief Complaint: Right arm numbness  I have personally briefly reviewed patient's old medical records in [place]   HPI: Hobie Kohles is a 77 y.o. male with medical history significant of A. fib on Eliquis, CVA, HLD, colon cancer s/p partial colectomy, arthritis; who presents with complaints of numbness.  Patient had been on the couch watching TV and reportedly dozed off for a short period of time.  When he awoke he had numbness in his right arm from his wrist to his elbow.  He tried rubbing his arm, but symptoms seem to last approximately 10 minutes or so before self resolving.  He checked his blood pressure noted that it was elevated. Associated symptoms included feeling woozy like he was off balance while he was walking around initially and slowed response.  This time he reports all symptoms have resolved.  Denies having any headache, change in speech, facial droop, palpitations, chest pain, nausea, vomiting, fever, chills, leg swelling, or alcohol use.  Patient reports that he had just undergone ulnar and carpal tunnel surgery 6 days ago.  Patient has been compliant with all of his medications.  ED Course: Upon admission at at the emergency department patient was noted to be afebrile vital signs relatively within normal limits.  Labs including CBC and BMP, and troponin were unremarkable.  Urinalysis was negative for any signs of infection.  CT scan of the brain showed no acute stroke and an old right cerebellar stroke..  Dr. Lorraine Lax of neurology was consulted and recommended transfer for completion of stroke work-up.  Review of Systems  Constitutional: Negative for chills, fever and weight loss.  HENT: Negative for ear discharge and tinnitus.   Eyes: Negative for double vision and photophobia.    Respiratory: Negative for cough and shortness of breath.   Cardiovascular: Negative for chest pain, palpitations and leg swelling.  Gastrointestinal: Negative for abdominal pain, nausea and vomiting.  Genitourinary: Negative for flank pain and urgency.  Musculoskeletal: Negative for falls.  Skin: Negative for itching.  Neurological: Positive for sensory change. Negative for speech change and headaches.  Psychiatric/Behavioral: Negative for substance abuse. The patient is not nervous/anxious.     Past Medical History:  Diagnosis Date  . Allergy   . Anxiety   . Arthritis    hands  . Atrial fibrillation ([place])   . Colon cancer [facility])    s/p partial colectomy 1990, Cscope neg 7-09, next 2014  . DISORDER, MITRAL VALVE    MV recontruction at [place] 2006----still needs ABX for SBE prophylaxis   . Elevated PSA    [name]  . GERD (gastroesophageal reflux disease)   . Heart murmur    no problems since surgery  . HYPERGLYCEMIA, BORDERLINE 10/16/2008  . HYPERLIPIDEMIA 06/15/2006   diet controlled  . Hypertension   . HYPERTROPHY PROSTATE W/O UR OBST & OTH LUTS 10/16/2008  . Neuropathy   . Right groin pain    Dr. Redmond Pulling  . SCC (squamous cell carcinoma) 06/2017   s/p Mohs---[name]  . Stroke ([place])   . Wears partial dentures    lower partial    Past Surgical History:  Procedure Laterality Date  . COLECTOMY  1990  . COLONOSCOPY    . HERNIA REPAIR Left 2014   [facility] rep w/mesh- March 2014  . LOOP RECORDER INSERTION  N/A 12/01/2016   Procedure: LOOP RECORDER INSERTION;  Surgeon: Evans Lance, MD;  Location: [place] CV LAB;  Service: Cardiovascular;  Laterality: N/A;  . MITRAL VALVE ANNULOPLASTY     [facility] 2006  . TEE WITHOUT CARDIOVERSION N/A 10/05/2016   Procedure: TRANSESOPHAGEAL ECHOCARDIOGRAM (TEE);  Surgeon: [name], MD;  Location: [facility] ENDOSCOPY;  Service: Cardiovascular;  Laterality: N/A;     reports that he quit smoking about 34 years ago. His smoking use included  cigarettes. He has a 10.00 pack-year smoking history. He has never used smokeless tobacco. He reports that he drinks about 7.0 standard drinks of alcohol per week. He reports that he does not use drugs.  Allergies  Allergen Reactions  . Statins Other (See Comments)    Myalgias (intolerance)    Family History  Problem Relation Age of Onset  . Colon cancer Other        M side (cousins)  . Diabetes Neg Hx   . Stroke Neg Hx   . CAD Neg Hx   . Prostate cancer Neg Hx   . Rectal cancer Neg Hx   . Esophageal cancer Neg Hx     Prior to Admission medications   Medication Sig Start Date End Date Taking? Authorizing Provider  gabapentin (NEURONTIN) 300 MG capsule Take 300 mg by mouth 3 (three) times daily as needed.   Yes [provider]  HYDROcodone-acetaminophen (NORCO/VICODIN) 5-325 MG tablet Take 1 tablet by mouth every 6 (six) hours as needed for moderate pain.   Yes [provider]  ALPRAZolam (XANAX) 0.25 MG tablet TAKE ONE OR TWO TABLETS BY MOUTH TWICE DAILY AS NEEDED FOR ANXIETY  08/29/17   Colon Branch, MD  Arginine 500 MG CAPS Take 1,500 mg by mouth daily.    [provider]  ELIQUIS 5 MG TABS tablet TAKE ONE TABLET BY MOUTH TWICE DAILY  06/29/17   Hilty, Nadean Corwin, MD  ezetimibe (ZETIA) 10 MG tablet Take 1 tablet (10 mg total) by mouth daily. 08/26/17   Colon Branch, MD  Glucosamine-MSM-Hyaluronic Acd (JOINT HEALTH PO) Take 1 tablet by mouth daily. [facility]    [provider]  loratadine (CLARITIN) 10 MG tablet Take 10 mg by mouth at bedtime.     [provider]  losartan (COZAAR) 25 MG tablet Take 1 tablet (25 mg total) by mouth 2 (two) times daily. 09/26/17   Colon Branch, MD  LYCOPENE PO Take 1 capsule by mouth 2 (two) times daily. [place] FORMULA LYCO-SORB PROSTATE SUPPORT     [provider]  metoprolol succinate (TOPROL-XL) 25 MG 24 hr tablet Take 1 tablet (25 mg total) by mouth daily. 07/15/17   Hilty, Nadean Corwin, MD    Multiple Vitamins-Minerals (OCUVITE PRESERVISION PO) Take 1 tablet by mouth 2 (two) times daily.     [provider]  Omega-3 Fatty Acids (FISH OIL PO) Take 1,000 mg by mouth daily.     [provider]  rosuvastatin (CRESTOR) 20 MG tablet Take 20 mg by mouth daily.    [provider]    Physical Exam:  Constitutional: NAD, calm, comfortable Vitals:   12/03/17 0120 12/03/17 0130 12/03/17 0200 12/03/17 0324  BP: (!) 157/80 137/74 125/72 (!) 145/75  Pulse: 63 60 (!) 58 60  Resp: 13 17 16 10   Temp:    98.2 F (36.8 C)  TempSrc:    Oral  SpO2: 99% 97% 94% 98%  Weight:    83.6  kg  Height:    5\' 10"  (1.778 m)   Eyes: PERRL, lids and conjunctivae normal ENMT: Mucous membranes are moist. Posterior pharynx clear of any exudate or lesions.Normal dentition.  Neck: normal, supple, no masses, no thyromegaly Respiratory: clear to auscultation bilaterally, no wheezing, no crackles. Normal respiratory effort. No accessory muscle use.  Cardiovascular: Regular rate and rhythm, no murmurs / rubs / gallops. No extremity edema. 2+ pedal pulses. No carotid bruits.  Abdomen: no tenderness, no masses palpated. No hepatosplenomegaly. Bowel sounds positive.  Musculoskeletal: no clubbing / cyanosis. No joint deformity upper and lower extremities. Good ROM, no contractures. Normal muscle tone.  Skin: Postop wounds bandaged of the right wrist and elbow Neurologic: CN 2-12 grossly intact. Sensation intact, DTR normal. Strength 5/5 in all 4.  Speech appears slowed, but no family present and patient believes speech is at baseline. Psychiatric: Normal judgment and insight. Alert and oriented x 3. Normal mood.     Labs on Admission: I have personally reviewed following labs and imaging studies  CBC: Recent Labs  Lab 12/02/17 2358  WBC 5.7  NEUTROABS 3.3  HGB 14.0  HCT 42.8  MCV 86.5  PLT 973   Basic Metabolic Panel: Recent Labs  Lab 12/02/17 2358  NA 140  K 3.7  CL 103   CO2 28  GLUCOSE 121*  BUN 22  CREATININE 1.01  CALCIUM 9.2   GFR: Estimated Creatinine Clearance: 61.2 mL/min (by C-G formula based on SCr of 1.01 mg/dL). Liver Function Tests: No results for input(s): AST, ALT, ALKPHOS, BILITOT, PROT, ALBUMIN in the last 168 hours. No results for input(s): LIPASE, AMYLASE in the last 168 hours. No results for input(s): AMMONIA in the last 168 hours. Coagulation Profile: No results for input(s): INR, PROTIME in the last 168 hours. Cardiac Enzymes: Recent Labs  Lab 12/02/17 2358  TROPONINI <0.03   BNP (last 3 results) No results for input(s): PROBNP in the last 8760 hours. HbA1C: No results for input(s): HGBA1C in the last 72 hours. CBG: No results for input(s): GLUCAP in the last 168 hours. Lipid Profile: No results for input(s): CHOL, HDL, LDLCALC, TRIG, CHOLHDL, LDLDIRECT in the last 72 hours. Thyroid Function Tests: No results for input(s): TSH, T4TOTAL, FREET4, T3FREE, THYROIDAB in the last 72 hours. Anemia Panel: No results for input(s): VITAMINB12, FOLATE, FERRITIN, TIBC, IRON, RETICCTPCT in the last 72 hours. Urine analysis:    Component Value Date/Time   COLORURINE YELLOW 12/03/2017 0132   APPEARANCEUR CLEAR 12/03/2017 0132   LABSPEC 1.010 12/03/2017 0132   PHURINE 7.5 12/03/2017 0132   GLUCOSEU NEGATIVE 12/03/2017 0132   HGBUR NEGATIVE 12/03/2017 0132   BILIRUBINUR NEGATIVE 12/03/2017 0132   KETONESUR NEGATIVE 12/03/2017 0132   PROTEINUR NEGATIVE 12/03/2017 0132   NITRITE NEGATIVE 12/03/2017 0132   LEUKOCYTESUR NEGATIVE 12/03/2017 0132   Sepsis Labs: No results found for this or any previous visit (from the past 240 hour(s)).   Radiological Exams on Admission: Ct Head Wo Contrast  Result Date: 12/03/2017 CLINICAL DATA:  77 year old male with unsteadiness in his gait. Right arm numbness. Prior history of strokes. EXAM: CT HEAD WITHOUT CONTRAST TECHNIQUE: Contiguous axial images were obtained from the base of the skull  through the vertex without intravenous contrast. COMPARISON:  CT the abdomen and pelvis 02/21/2017. FINDINGS: Brain: Again noted are some well-defined areas of low attenuation in the right cerebellar hemisphere related to remote right PICA territory infarction. No evidence of acute infarction, hemorrhage, hydrocephalus, extra-axial collection or mass lesion/mass effect. Vascular:  No hyperdense vessel or unexpected calcification. Skull: Normal. Negative for fracture or focal lesion. Sinuses/Orbits: No acute finding. Other: None. IMPRESSION: 1. No acute intracranial abnormalities. 2. Old right cerebellar hemisphere infarct redemonstrated. Electronically Signed   By: Vinnie Langton M.D.   On: 12/03/2017 00:55    EKG: Independently reviewed.  Sinus rhythm at 66 bpm  Assessment/Plan Paresthesias of right arm, question TIA acute.  Patient presents with numbness of the right arm from the wrist to his elbow where he had recent carpal tunnel and ulnar nerve surgery.  He reported associated symptoms of feeling off balance.  Initial CT scan of the brain showed no acute stroke.  Question if symptoms secondary to recent surgical procedures and position of arm while asleep versus TIA. - Admit to telemetry bed - Stroke order set initiated - Neuro checks  - Check MRI of the brain and vascular carotid Doppler - PT/OT/speech to eval and treat - Appreciate neurology consultative services will follow-up for further recommendations  Paroxysmal atrial fibrillation on chronic anticoagulation, history of mitral valve repair: Patient currently in sinus rhythm. - Continue Eliquis  History of CVA - Continue statin  Essential hypertension - Continue metoprolol and losartan  Neuropathy - Continue gabapentin  Hyperlipidemia: Labs from 8/22 noted total cholesterol 190, HDL 37, LDL 120, triglycerides 159 - Continue Zetia and Crestor  DVT prophylaxis: Eliquis Code Status: Full Family Communication: No family present  at bedside Disposition Plan: Likely discharge home if work-up negative Consults called: Neurology Admission status: Observation  Norval Morton MD [facility] Pager [phone]   If 7PM-7AM, please contact night-coverage [email or web address] Password [email or web address]  12/03/2017, 5:20 AM

## 2017-12-03 NOTE — ED Notes (Signed)
Contacted Dr. Bettina Gavia office [phone]) - this is patient's physician.

## 2017-12-03 NOTE — Consult Note (Signed)
Requesting Physician: Dr. Tamala Julian    Chief Complaint: Right arm    History obtained from: Patient and Chart    HPI:                                                                                                                                       Kevin Bass is an 77 y.o. male A. fib on Eliquis (resumed on Monday), colon cancer, prior stroke, recent median and ulnar nerve decompression presents with 10-minute episode of weakness and numbness of his right arm below the elbow on waking up after falling asleep on the couch. Symptoms started at 10 pm, and he noticed he could not move his right arm below the elbow and felt numb. He got up and noticed he his gait was off. In about 10 minutes sensation and strength returned to his right arm, but still felt off balance. His blood pressure was elevated at 161 systolic when he checked and decided to go the the ER. Was seen at [facility] and transferred to [facility].   Date last known well: 12/02/17 Time last known well: 10pm tPA Given: no, outside window, symptoms resolved NIHSS: 0 Baseline MRS 1    Past Medical History:  Diagnosis Date  . Allergy   . Anxiety   . Arthritis    hands  . Atrial fibrillation ([place])   . Colon cancer [facility])    s/p partial colectomy 1990, Cscope neg 7-09, next 2014  . DISORDER, MITRAL VALVE    MV recontruction at [facility] 2006----still needs ABX for SBE prophylaxis   . Elevated PSA    [name]  . GERD (gastroesophageal reflux disease)   . Heart murmur    no problems since surgery  . HYPERGLYCEMIA, BORDERLINE 10/16/2008  . HYPERLIPIDEMIA 06/15/2006   diet controlled  . Hypertension   . HYPERTROPHY PROSTATE W/O UR OBST & OTH LUTS 10/16/2008  . Neuropathy   . Right groin pain    Dr. Redmond Pulling  . SCC (squamous cell carcinoma) 06/2017   s/p Mohs---[name]  . Stroke ([place])   . Wears partial dentures    lower partial    Past Surgical History:  Procedure Laterality Date  . COLECTOMY  1990  .  COLONOSCOPY    . HERNIA REPAIR Left 2014   [facility] rep w/mesh- March 2014  . LOOP RECORDER INSERTION N/A 12/01/2016   Procedure: LOOP RECORDER INSERTION;  Surgeon: Evans Lance, MD;  Location: [place] [facility];  Service: Cardiovascular;  Laterality: N/A;  . MITRAL VALVE ANNULOPLASTY     [facility] 2006  . TEE WITHOUT CARDIOVERSION N/A 10/05/2016   Procedure: TRANSESOPHAGEAL ECHOCARDIOGRAM (TEE);  Surgeon: [name], MD;  Location: [facility] ENDOSCOPY;  Service: Cardiovascular;  Laterality: N/A;    Family History  Problem Relation Age of Onset  . Colon cancer Other        M side (  cousins)  . Diabetes Neg Hx   . Stroke Neg Hx   . CAD Neg Hx   . Prostate cancer Neg Hx   . Rectal cancer Neg Hx   . Esophageal cancer Neg Hx    Social History:  reports that he quit smoking about 34 years ago. His smoking use included cigarettes. He has a 10.00 pack-year smoking history. He has never used smokeless tobacco. He reports that he drinks about 7.0 standard drinks of alcohol per week. He reports that he does not use drugs.  Allergies:  Allergies  Allergen Reactions  . Statins Other (See Comments)    Myalgias (intolerance)    Medications:                                                                                                                        I reviewed home medications   ROS:                                                                                                                                     14 systems reviewed and negative except above   Examination:                                                                                                      General: Appears well-developed  Psych: Affect appropriate to situation Eyes: No scleral injection HENT: No OP obstrucion Head: Normocephalic.  Cardiovascular: Normal rate and regular rhythm.  Respiratory: Effort normal and breath sounds normal to anterior ascultation GI: Soft.  No distension. There is no tenderness.   Skin: WDI    Neurological Examination Mental Status: Alert, oriented, thought content appropriate.  Speech fluent without evidence of aphasia. Able to follow 3 step commands without difficulty. Cranial Nerves: II: Visual fields grossly normal  III,IV, VI: ptosis not present, extra-ocular motions intact bilaterally, pupils equal, round, reactive to light and accommodation V,VII: smile symmetric, facial light touch sensation normal bilaterally VIII: hearing normal bilaterally IX,X:  uvula rises symmetrically XI: bilateral shoulder shrug XII: midline tongue extension Motor: Right : Upper extremity   5/5    Left:     Upper extremity   5/5  Lower extremity   5/5     Lower extremity   5/5 Tone and bulk:normal tone throughout; no atrophy noted Sensory: Pinprick and light touch intact throughout, bilaterally Deep Tendon Reflexes: 2+ and symmetric throughout Plantars: Right: downgoing   Left: downgoing Cerebellar: normal finger-to-nose, normal rapid alternating movements and normal heel-to-shin test Gait: normal gait and station     Lab Results: Basic Metabolic Panel: Recent Labs  Lab 12/02/17 2358  NA 140  K 3.7  CL 103  CO2 28  GLUCOSE 121*  BUN 22  CREATININE 1.01  CALCIUM 9.2    CBC: Recent Labs  Lab 12/02/17 2358  WBC 5.7  NEUTROABS 3.3  HGB 14.0  HCT 42.8  MCV 86.5  PLT 179    Coagulation Studies: No results for input(s): LABPROT, INR in the last 72 hours.  Imaging: Ct Head Wo Contrast  Result Date: 12/03/2017 CLINICAL DATA:  77 year old male with unsteadiness in his gait. Right arm numbness. Prior history of strokes. EXAM: CT HEAD WITHOUT CONTRAST TECHNIQUE: Contiguous axial images were obtained from the base of the skull through the vertex without intravenous contrast. COMPARISON:  CT the abdomen and pelvis 02/21/2017. FINDINGS: Brain: Again noted are some well-defined areas of low attenuation in the right cerebellar hemisphere related to remote right  PICA territory infarction. No evidence of acute infarction, hemorrhage, hydrocephalus, extra-axial collection or mass lesion/mass effect. Vascular: No hyperdense vessel or unexpected calcification. Skull: Normal. Negative for fracture or focal lesion. Sinuses/Orbits: No acute finding. Other: None. IMPRESSION: 1. No acute intracranial abnormalities. 2. Old right cerebellar hemisphere infarct redemonstrated. Electronically Signed   By: Vinnie Langton M.D.   On: 12/03/2017 00:55     ASSESSMENT AND PLAN   77 year old male with A. fib on Eliquis (resumed on Monday), colon cancer, prior stroke, recent median and ulnar nerve decompression presents with 10-minute episode of weakness and numbness of his right arm below the elbow on waking up after falling asleep on the couch.  Also had some gait instability for a few hours.  Symptoms have completely resolved.  Transferred from [place] for TIA stroke work-up.   Saturday night palsy ( radial nerve compression) vs TIA/Acute ischemic stroke   Recommendations - Obtain MRI Aaron Edelman w/o contrast to rule out stroke - CTA head and neck  - If negative, can be discharged and outpatient follow up with Neurology  - Continue Eliquis    Sushanth Aroor [facility] Pager Number [phone]

## 2017-12-03 NOTE — Evaluation (Signed)
Physical Therapy Evaluation Patient Details Name: Kevin Bass MRN: [id number] DOB: [birth year] Today's Date: 12/03/2017   History of Present Illness  Pt is a 77 y/o male admitted secondary to R arm numbness. MRI revealed Acute infarct left hippocampus, occlusion left posterior cerebral artery which could be acute. PMH including but not limited to a-fib, colon cancer, HTN, stroke.    Clinical Impression  Pt presented supine in bed with HOB elevated, awake and willing to participate in therapy session. Prior to admission, pt reported that he was independent with all functional mobility and ADLs. He is currently limited secondary to weakness and poor balance and coordination. Pt required supervision with bed mobility, min A for transfers and min guard to min A for ambulation without use of an AD. Pt would continue to benefit from skilled physical therapy services at this time while admitted and after d/c to address the below listed limitations in order to improve overall safety and independence with functional mobility.     Follow Up Recommendations CIR;Supervision/Assistance - 24 hour    Equipment Recommendations  None recommended by PT    Recommendations for Other Services       Precautions / Restrictions Precautions Precautions: Fall Restrictions Weight Bearing Restrictions: No      Mobility  Bed Mobility Overal bed mobility: Needs Assistance Bed Mobility: Supine to Sit     Supine to sit: Supervision     General bed mobility comments: increased time and effort, supervision for safety  Transfers Overall transfer level: Needs assistance Equipment used: None Transfers: Sit to/from Stand Sit to Stand: Min assist         General transfer comment: assist for stability with transition into standing from EOB  Ambulation/Gait Ambulation/Gait assistance: Min guard;Min assist Gait Distance (Feet): 150 Feet Assistive device: None Gait Pattern/deviations: Step-through  pattern;Decreased stride length;Drifts right/left Gait velocity: decreased   General Gait Details: pt with modest instability requiring constant close min guard for safety with occasional min A for stability and balance  Stairs Stairs: Yes Stairs assistance: Min guard Stair Management: Two rails;Step to pattern;Forwards Number of Stairs: 2(x2 trials) General stair comments: close min guard for safety, use of bilateral hand rails for support  Wheelchair Mobility    Modified Rankin (Stroke Patients Only) Modified Rankin (Stroke Patients Only) Pre-Morbid Rankin Score: No symptoms Modified Rankin: Moderate disability     Balance Overall balance assessment: Needs assistance Sitting-balance support: Feet supported Sitting balance-Leahy Scale: Good     Standing balance support: During functional activity;No upper extremity supported Standing balance-Leahy Scale: Poor Standing balance comment: static standing balance is fair, dynamic is poor                 Standardized Balance Assessment Standardized Balance Assessment : Dynamic Gait Index   Dynamic Gait Index Level Surface: Normal Change in Gait Speed: Normal Gait with Horizontal Head Turns: Normal Gait with Vertical Head Turns: Normal Gait and Pivot Turn: Mild Impairment Step Over Obstacle: Mild Impairment Step Around Obstacles: Normal Steps: Moderate Impairment Total Score: 20       Pertinent Vitals/Pain Pain Assessment: No/denies pain    Home Living Family/patient expects to be discharged to:: Private residence Living Arrangements: Alone Available Help at Discharge: Family;Available PRN/intermittently Type of Home: House Home Access: Stairs to enter Entrance Stairs-Rails: Psychiatric nurse of Steps: 4 Home Layout: Two level Home Equipment: None      Prior Function Level of Independence: Independent  Hand Dominance        Extremity/Trunk Assessment   Upper  Extremity Assessment Upper Extremity Assessment: Defer to OT evaluation;RUE deficits/detail RUE Coordination: decreased gross motor;decreased fine motor    Lower Extremity Assessment Lower Extremity Assessment: Generalized weakness       Communication   Communication: Expressive difficulties  Cognition Arousal/Alertness: Awake/alert Behavior During Therapy: WFL for tasks assessed/performed Overall Cognitive Status: Impaired/Different from baseline Area of Impairment: Memory;Following commands;Safety/judgement;Problem solving                     Memory: Decreased short-term memory Following Commands: Follows one step commands with increased time Safety/Judgement: Decreased awareness of deficits;Decreased awareness of safety   Problem Solving: Slow processing;Difficulty sequencing;Requires verbal cues        General Comments      Exercises     Assessment/Plan    PT Assessment Patient needs continued PT services  PT Problem List Decreased strength;Decreased range of motion;Decreased activity tolerance;Decreased balance;Decreased mobility;Decreased coordination;Decreased knowledge of use of DME;Decreased safety awareness;Decreased knowledge of precautions       PT Treatment Interventions Gait training;DME instruction;Stair training;Therapeutic activities;Functional mobility training;Therapeutic exercise;Balance training;Neuromuscular re-education;Cognitive remediation;Patient/family education    PT Goals (Current goals can be found in the Care Plan section)  Acute Rehab PT Goals Patient Stated Goal: return home PT Goal Formulation: With patient Time For Goal Achievement: 12/17/17 Potential to Achieve Goals: Good    Frequency Min 4X/week   Barriers to discharge        Co-evaluation               AM-PAC PT "6 Clicks" Daily Activity  Outcome Measure Difficulty turning over in bed (including adjusting bedclothes, sheets and blankets)?: A  Little Difficulty moving from lying on back to sitting on the side of the bed? : A Little Difficulty sitting down on and standing up from a chair with arms (e.g., wheelchair, bedside commode, etc,.)?: Unable Help needed moving to and from a bed to chair (including a wheelchair)?: A Little Help needed walking in hospital room?: A Little Help needed climbing 3-5 steps with a railing? : A Little 6 Click Score: 16    End of Session Equipment Utilized During Treatment: Gait belt Activity Tolerance: Patient tolerated treatment well Patient left: with call bell/phone within reach;Other (comment)(transport present to take pt for testing) Nurse Communication: Mobility status PT Visit Diagnosis: Other abnormalities of gait and mobility (R26.89);Other symptoms and signs involving the nervous system (R29.898)    Time: 9924-2683 PT Time Calculation (min) (ACUTE ONLY): 16 min   Charges:   PT Evaluation $PT Eval Moderate Complexity: 1 Mod          Sherie Don, PT, DPT  Acute Rehabilitation Services Pager [phone] Office [place] 12/03/2017, 1:28 PM

## 2017-12-03 NOTE — Progress Notes (Signed)
VASCULAR LAB PRELIMINARY  PRELIMINARY  PRELIMINARY  PRELIMINARY  Carotid duplex completed.    Preliminary report:  1-39% ICA stenosis. Vertebral artery flow is antegrade.   Londen Lorge, RVT 12/03/2017, 11:21 AM

## 2017-12-03 NOTE — ED Notes (Signed)
Patient transported to CT 

## 2017-12-03 NOTE — Progress Notes (Signed)
  Echocardiogram 2D Echocardiogram has been performed.  Kevin Bass 12/03/2017, 10:51 AM

## 2017-12-03 NOTE — Progress Notes (Signed)
Returned to floor

## 2017-12-03 NOTE — Progress Notes (Signed)
Occupational Therapy Progress Note  Pt seen for additional session for pt/family education regarding need for S with IADL tasks due to apparent deficits with STM. Will further assess cognition as it pertains to IADL and functional tasks. Pt/daughter verbalized understanding.     12/03/17 1700  OT Visit Information  Last OT Received On 12/03/17  Assistance Needed +1  History of Present Illness Pt is a 77 y/o male admitted secondary to R arm numbness. MRI revealed Acute infarct left hippocampus, occlusion left posterior cerebral artery which could be acute. PMH including but not limited to a-fib, colon cancer, HTN, stroke.  Precautions  Precautions Fall  Pain Assessment  Pain Assessment No/denies pain  General Comments  General comments (skin integrity, edema, etc.) Met daughter, nurse and pt ambulating in hall. Daughter asking questions about DC. Educated pt/daughter regarding apparent deficits with short term memory as demosntrated by pt telling this therapist the same stories multiple times. Educated pt/daughter impact impaired STM could have on Doctor, hospital, medication management and home safety. Pt/daughter verbalized understanding. Will further assess.   OT - End of Session  Activity Tolerance Patient tolerated treatment well  Patient left in chair;with call bell/phone within reach;with family/visitor present  Nurse Communication Mobility status  OT Assessment/Plan  OT Plan Discharge plan remains appropriate  OT Visit Diagnosis Unsteadiness on feet (R26.81);Other symptoms and signs involving cognitive function  OT Frequency (ACUTE ONLY) Min 3X/week  Follow Up Recommendations Home health OT;Supervision/Assistance - 24 hour  OT Equipment None recommended by OT  AM-PAC OT "6 Clicks" Daily Activity Outcome Measure  Help from another person eating meals? 4  Help from another person taking care of personal grooming? 4  Help from another person toileting, which includes using  toliet, bedpan, or urinal? 4  Help from another person bathing (including washing, rinsing, drying)? 4  Help from another person to put on and taking off regular upper body clothing? 4  Help from another person to put on and taking off regular lower body clothing? 4  6 Click Score 24  ADL G Code Conversion CH  OT Goal Progression  Progress towards OT goals Progressing toward goals  Acute Rehab OT Goals  Patient Stated Goal return home  OT Goal Formulation With patient  Time For Goal Achievement 12/17/17  Potential to Achieve Goals Good  ADL Goals  Pt Will Transfer to Toilet with modified independence;ambulating  Pt Will Perform Tub/Shower Transfer with modified independence;Tub transfer;Shower transfer;ambulating  Additional ADL Goal #1 Pt/family will verbalize 3 strategies to complensate for memory deficits  OT Time Calculation  OT Start Time (ACUTE ONLY) 1635  OT Stop Time (ACUTE ONLY) 1650  OT Time Calculation (min) 15 min  OT General Charges  $OT Visit 1 Visit  OT Treatments  $Therapeutic Activity 8-22 mins  Maurie Boettcher, OT/L   Acute OT Clinical Specialist [place] Pager [phone] Office [phone]

## 2017-12-03 NOTE — Progress Notes (Signed)
PROGRESS NOTE    Kevin Bass  BPZ:[id number] DOB: [birth year] DOA: 12/02/2017 PCP: Colon Branch, MD    Brief Narrative:  77 y.o. male with medical history significant of A. fib on Eliquis, CVA, HLD, colon cancer s/p partial colectomy, arthritis; who presents with complaints of numbness.  Patient had been on the couch watching TV and reportedly dozed off for a short period of time.  When he awoke he had numbness in his right arm from his wrist to his elbow.  He tried rubbing his arm, but symptoms seem to last approximately 10 minutes or so before self resolving.  He checked his blood pressure noted that it was elevated. Associated symptoms included feeling woozy like he was off balance while he was walking around initially and slowed response.  This time he reports all symptoms have resolved.  Denies having any headache, change in speech, facial droop, palpitations, chest pain, nausea, vomiting, fever, chills, leg swelling, or alcohol use.  Patient reports that he had just undergone ulnar and carpal tunnel surgery 6 days ago.  Patient has been compliant with all of his medications.  ED Course: Upon admission at at the emergency department patient was noted to be afebrile vital signs relatively within normal limits.  Labs including CBC and BMP, and troponin were unremarkable.  Urinalysis was negative for any signs of infection.  CT scan of the brain showed no acute stroke and an old right cerebellar stroke..  Dr. Lorraine Lax of neurology was consulted and recommended transfer for completion of stroke work-up.  Assessment & Plan:   Principal Problem:   Paresthesias Active Problems:   Hyperlipidemia   HTN (hypertension)   Paroxysmal atrial fibrillation (HCC)   TIA (transient ischemic attack)   Chronic anticoagulation   History of CVA (cerebrovascular accident)   Acute L Hippocampus CVA with acute L posterior cerabral artery -MRI reviewed. Chronic bilateral infarcts also noted, suggesting  embolic source in setting of afib. Discussed with Neurology -Neurology already consulted and is following -2d echo performed, pending read -bilateral dopplers without occlusion   Paroxysmal atrial fibrillation on chronic anticoagulation, history of mitral valve repair: Patient currently in sinus rhythm. - Continue Eliquis as tolerated - Reportedly compliant with meds  History of CVA - Continue statin as tolerated  Essential hypertension - Continue metoprolol and losartan as tolerated  Neuropathy - Continue gabapentin as tolerated  Hyperlipidemia: Labs from 8/22 noted total cholesterol 190, HDL 37, LDL 120, triglycerides 159 - Continue Zetia and Crestor as patient tolerates  DVT prophylaxis: Eliquis Code Status: Full Family Communication: Pt in room Disposition Plan: Uncertain at this time  Consultants:   Neurology  Procedures:     Antimicrobials: Anti-infectives (From admission, onward)   None       Subjective: Eager to go home  Objective: Vitals:   12/03/17 0515 12/03/17 0714 12/03/17 0903 12/03/17 1245  BP: 119/69 127/73 132/66 114/80  Pulse:   72   Resp: 14 16 17 11   Temp: 98.1 F (36.7 C) 98.2 F (36.8 C) 98.4 F (36.9 C) 98.5 F (36.9 C)  TempSrc:  Oral Oral Oral  SpO2: 96% 95% 95% 97%  Weight:      Height:        Intake/Output Summary (Last 24 hours) at 12/03/2017 1339 Last data filed at 12/03/2017 0630 Gross per 24 hour  Intake 150 ml  Output -  Net 150 ml   Filed Weights   12/02/17 2339 12/03/17 0324  Weight: 86 kg 83.6 kg  Examination:  General exam: Appears calm and comfortable  Respiratory system: Clear to auscultation. Respiratory effort normal. Cardiovascular system: S1 & S2 heard, RRR Gastrointestinal system: Abdomen is nondistended, soft and nontender. No organomegaly or masses felt. Normal bowel sounds heard. Central nervous system: Alert and oriented. No focal neurological deficits. Extremities: Symmetric 5 x 5  power. Skin: No rashes, lesions  Psychiatry: Judgement and insight appear normal. Mood & affect appropriate.   Data Reviewed: I have personally reviewed following labs and imaging studies  CBC: Recent Labs  Lab 12/02/17 2358  WBC 5.7  NEUTROABS 3.3  HGB 14.0  HCT 42.8  MCV 86.5  PLT 627   Basic Metabolic Panel: Recent Labs  Lab 12/02/17 2358  NA 140  K 3.7  CL 103  CO2 28  GLUCOSE 121*  BUN 22  CREATININE 1.01  CALCIUM 9.2   GFR: Estimated Creatinine Clearance: 61.2 mL/min (by C-G formula based on SCr of 1.01 mg/dL). Liver Function Tests: No results for input(s): AST, ALT, ALKPHOS, BILITOT, PROT, ALBUMIN in the last 168 hours. No results for input(s): LIPASE, AMYLASE in the last 168 hours. No results for input(s): AMMONIA in the last 168 hours. Coagulation Profile: No results for input(s): INR, PROTIME in the last 168 hours. Cardiac Enzymes: Recent Labs  Lab 12/02/17 2358  TROPONINI <0.03   BNP (last 3 results) No results for input(s): PROBNP in the last 8760 hours. HbA1C: No results for input(s): HGBA1C in the last 72 hours. CBG: No results for input(s): GLUCAP in the last 168 hours. Lipid Profile: No results for input(s): CHOL, HDL, LDLCALC, TRIG, CHOLHDL, LDLDIRECT in the last 72 hours. Thyroid Function Tests: Recent Labs    12/03/17 0626  TSH 1.850   Anemia Panel: No results for input(s): VITAMINB12, FOLATE, FERRITIN, TIBC, IRON, RETICCTPCT in the last 72 hours. Sepsis Labs: No results for input(s): PROCALCITON, LATICACIDVEN in the last 168 hours.  No results found for this or any previous visit (from the past 240 hour(s)).   Radiology Studies: Ct Head Wo Contrast  Result Date: 12/03/2017 CLINICAL DATA:  77 year old male with unsteadiness in his gait. Right arm numbness. Prior history of strokes. EXAM: CT HEAD WITHOUT CONTRAST TECHNIQUE: Contiguous axial images were obtained from the base of the skull through the vertex without intravenous  contrast. COMPARISON:  CT the abdomen and pelvis 02/21/2017. FINDINGS: Brain: Again noted are some well-defined areas of low attenuation in the right cerebellar hemisphere related to remote right PICA territory infarction. No evidence of acute infarction, hemorrhage, hydrocephalus, extra-axial collection or mass lesion/mass effect. Vascular: No hyperdense vessel or unexpected calcification. Skull: Normal. Negative for fracture or focal lesion. Sinuses/Orbits: No acute finding. Other: None. IMPRESSION: 1. No acute intracranial abnormalities. 2. Old right cerebellar hemisphere infarct redemonstrated. Electronically Signed   By: Vinnie Langton M.D.   On: 12/03/2017 00:55   Mr Jodene Nam Head Wo Contrast  Result Date: 12/03/2017 CLINICAL DATA:  TIA. Right arm weakness and numbness. Atrial fibrillation on Eliquis. EXAM: MRI HEAD WITHOUT CONTRAST MRA HEAD WITHOUT CONTRAST TECHNIQUE: Multiplanar, multiecho pulse sequences of the brain and surrounding structures were obtained without intravenous contrast. Angiographic images of the head were obtained using MRA technique without contrast. COMPARISON:  CT head 12/03/2017 FINDINGS: MRI HEAD FINDINGS Brain: Acute infarct left hippocampus with restricted diffusion. Chronic infarct left parietal operculum. Chronic infarct right cerebellum. Cerebral white matter intact. Negative for mass lesion. Negative for hemorrhage. Ventricle size normal. Vascular: Normal arterial flow voids Skull and upper cervical spine: Negative Sinuses/Orbits:  Mild mucosal edema paranasal sinuses.  Normal orbit Other: None MRA HEAD FINDINGS Both vertebral arteries patent to the basilar. Basilar tortuous but patent. PICA patent bilaterally. The superior cerebellar arteries patent bilaterally. Right posterior cerebral artery widely patent. Occlusion of the left posterior cerebral artery proximally Internal carotid artery patent bilaterally without stenosis. Anterior and middle cerebral arteries patent  bilaterally without stenosis. Incidental azygos anterior cerebral artery. IMPRESSION: Acute infarct left hippocampus. Occlusion left posterior cerebral artery which could be acute. Chronic infarct right cerebellum and left parietal operculum. Electronically Signed   By: Franchot Gallo M.D.   On: 12/03/2017 13:03   Mr Brain Wo Contrast  Result Date: 12/03/2017 CLINICAL DATA:  TIA. Right arm weakness and numbness. Atrial fibrillation on Eliquis. EXAM: MRI HEAD WITHOUT CONTRAST MRA HEAD WITHOUT CONTRAST TECHNIQUE: Multiplanar, multiecho pulse sequences of the brain and surrounding structures were obtained without intravenous contrast. Angiographic images of the head were obtained using MRA technique without contrast. COMPARISON:  CT head 12/03/2017 FINDINGS: MRI HEAD FINDINGS Brain: Acute infarct left hippocampus with restricted diffusion. Chronic infarct left parietal operculum. Chronic infarct right cerebellum. Cerebral white matter intact. Negative for mass lesion. Negative for hemorrhage. Ventricle size normal. Vascular: Normal arterial flow voids Skull and upper cervical spine: Negative Sinuses/Orbits: Mild mucosal edema paranasal sinuses.  Normal orbit Other: None MRA HEAD FINDINGS Both vertebral arteries patent to the basilar. Basilar tortuous but patent. PICA patent bilaterally. The superior cerebellar arteries patent bilaterally. Right posterior cerebral artery widely patent. Occlusion of the left posterior cerebral artery proximally Internal carotid artery patent bilaterally without stenosis. Anterior and middle cerebral arteries patent bilaterally without stenosis. Incidental azygos anterior cerebral artery. IMPRESSION: Acute infarct left hippocampus. Occlusion left posterior cerebral artery which could be acute. Chronic infarct right cerebellum and left parietal operculum. Electronically Signed   By: Franchot Gallo M.D.   On: 12/03/2017 13:03    Scheduled Meds: . apixaban  5 mg Oral BID  .  ezetimibe  10 mg Oral Daily  . losartan  25 mg Oral BID  . metoprolol succinate  25 mg Oral Daily  . rosuvastatin  20 mg Oral Daily   Continuous Infusions:   LOS: 0 days   Marylu Lund, MD [facility] Pager On Amion  If 7PM-7AM, please contact night-coverage 12/03/2017, 1:39 PM

## 2017-12-03 NOTE — Progress Notes (Signed)
Pt Kevin Bass  77 year old  White male admitted from [facility] ,brought in via stretcher by EMS . Pt alert awake and oriented x 4  MAE cancer/o slight numbness to RUA Initial assessment done. Marland Kitchenoriented to room and use of call bell to call for assist.. Cardiac monitor in use. Admission MD called and notify for new orders.     no  orders yet. RN will continue to monitor pt.

## 2017-12-03 NOTE — ED Notes (Signed)
ED Provider at bedside. 

## 2017-12-03 NOTE — ED Notes (Signed)
Contacted Dr. Lorraine Lax for Neuro Consult

## 2017-12-03 NOTE — Progress Notes (Addendum)
Patient off unit for echo and doppler

## 2017-12-03 NOTE — Plan of Care (Signed)
Plan of care note:  Patient was just seen by my colleague Dr.Aroor early this morning.  Chart reviewed.  Patient has history of A. fib on Eliquis, colon cancer status post colectomy in 1998, mitral wall repair and LAA surgically closed 30 years ago, hyperlipidemia, recent right carpal tunnel and ulnar neuropathy decompressive surgery presented with transient right arm weakness numbness after nap in the couch.  Patient had left MCA stroke in 08/2016, put on DAPT.  In 09/2016 TEE unremarkable, no PFO.  48 hours Holter monitor negative for A. fib.  Had recurrent stroke 11/2016 for left PICA infarct, CTA head and neck negative.  PET/CT negative for malignancy.  EF 55 to 60%.  LDL 60 and A1c 5.5.  Loop recorder placed.  Continued on DAPT.  Subsequently loop recorder found PAF, and he was put on Eliquis.  He follow with Dr. [name] in 07/2017 found to have right carpal tunnel syndrome and right ulnar neuropathy and L5 radiculopathy on EMG/NCS.  Refer to orthopedics and had recent right carpal tunnel and ulnar neuropathy decompression surgery.  Resumed Eliquis on Monday.  Dr. Lorraine Lax saw patient today, concerning for stroke/TIA versus peripheral neuropathy on the right side.  Recommend MRI/MRA, carotid Doppler and 2D echo, LDL and A1c for stroke work-up.  Currently, studies are pending.  Will follow-up in a.m. for further recommendations.  Patient will continue follow-up with Dr. [name] at [facility] neurology.  Rosalin Hawking, MD PhD Stroke Neurology 12/03/2017 9:05 AM

## 2017-12-04 LAB — HEMOGLOBIN A1C
Hgb A1c MFr Bld: 5.3 % (ref 4.8–5.6)
MEAN PLASMA GLUCOSE: 105.41 mg/dL

## 2017-12-04 MED ORDER — ROSUVASTATIN CALCIUM 40 MG PO TABS: 40.0000 mg | ORAL_TABLET | Freq: Every day | ORAL | 0 refills | Status: DC

## 2017-12-04 NOTE — Progress Notes (Signed)
Occupational Therapy Treatment Patient Details Name: Kevin Bass MRN: [id number] DOB: 1941/01/01 Today's Date: 12/04/2017    History of present illness Pt is a 77 y/o male admitted secondary to R arm numbness. MRI revealed Acute infarct left hippocampus, occlusion left posterior cerebral artery which could be acute. PMH including but not limited to a-fib, colon cancer, HTN, stroke.   OT comments   Patient was given the short blessed test. Patient performed test and scored a 6 which would score patient a questionable range. Patient does have short term memory problems and asked patient on strategies to use in kitchen for safety. Patient stated he would not leave cooking food unattended that he would stay and monitor food prep. Acute ot to follow until d/c home. Recommend HHOT to assess safety of patient in home environment.  Follow Up Recommendations  Home health OT    Equipment Recommendations       Recommendations for Other Services      Precautions / Restrictions Precautions Precautions: Fall       Mobility Bed Mobility                  Transfers                      Balance                                           ADL either performed or assessed with clinical judgement   ADL                           Toilet Transfer: Modified Independent             General ADL Comments: Patient was Mod I wtih AMB to walk in shower.      Vision       Perception     Praxis      Cognition Arousal/Alertness: Awake/alert Behavior During Therapy: WFL for tasks assessed/performed Overall Cognitive Status: Impaired/Different from baseline Area of Impairment: Memory;Problem solving                     Memory: Decreased short-term memory Following Commands: Follows multi-step commands consistently                Exercises     Shoulder Instructions       General Comments      Pertinent Vitals/  Pain       Pain Assessment: 0-10 Pain Score: 1  Pain Location: (r hand neuropathy had sx for carpel tunnel) Pain Descriptors / Indicators: Aching  Home Living                                          Prior Functioning/Environment              Frequency           Progress Toward Goals  OT Goals(current goals can now be found in the care plan section)  Progress towards OT goals: Progressing toward goals  Acute Rehab OT Goals Patient Stated Goal: (go home) Potential to Achieve Goals: Good ADL Goals Pt Will Transfer to Toilet: with modified independence Pt Will Perform Tub/Shower Transfer: with modified independence  Plan  Discharge plan remains appropriate    Co-evaluation                 AM-PAC PT "6 Clicks" Daily Activity     Outcome Measure   Help from another person eating meals?: None Help from another person taking care of personal grooming?: None Help from another person toileting, which includes using toliet, bedpan, or urinal?: None Help from another person bathing (including washing, rinsing, drying)?: None Help from another person to put on and taking off regular upper body clothing?: None Help from another person to put on and taking off regular lower body clothing?: None 6 Click Score: 24    End of Session Equipment Utilized During Treatment: Gait belt      Activity Tolerance Patient tolerated treatment well   Patient Left in chair   Nurse Communication (ok therapy)        Time: 3794-3276 OT Time Calculation (min): 27 min  Charges: OT General Charges $OT Visit: 1 Visit OT Treatments $Self Care/Home Management : 8-22 mins $Therapeutic Activity: 1-47 mins  6 clicks   Danai Gotto 12/04/2017, 11:42 AM

## 2017-12-04 NOTE — Progress Notes (Signed)
Referral placed to [facility] outpatient rehab for PT OT SLP. No other CM needs identified.

## 2017-12-04 NOTE — Progress Notes (Addendum)
Patient and daughter given discharge instructions and summary, able to verbalize understanding and teach back. No new questions or concerns. IV and telemetry removed.  Patient discharged from unit by staff, family to transport home

## 2017-12-04 NOTE — Evaluation (Signed)
Speech Language Pathology Evaluation Patient Details Name: Erma Joubert MRN: [id number] DOB: 10-Sep-1940 Today's Date: 12/04/2017 Time: 5053-9767 SLP Time Calculation (min) (ACUTE ONLY): 20 min  Problem List:  Patient Active Problem List   Diagnosis Date Noted  . TIA (transient ischemic attack) 12/03/2017  . Chronic anticoagulation 12/03/2017  . History of CVA (cerebrovascular accident) 12/03/2017  . Paroxysmal atrial fibrillation ([place]) 07/11/2017  . SCC (squamous cell carcinoma) 06/22/2017  . Varicose veins of both lower extremities 06/19/2017  . Bilateral lower extremity edema 06/19/2017  . Paresthesias 03/03/2017  . Trigeminal neuralgia of right side of face 10/01/2016  . History of mitral valve repair 09/30/2016  . Cerebrovascular accident (CVA) ([place]) 09/12/2016  . GERD (gastroesophageal reflux disease) 09/22/2015  . PCP NOTES >>>>> 11/08/2014  . Chest pain 06/07/2014  . Facial neuropathy 04/02/2013  . Anxiety 02/26/2013  . Annual physical exam 08/30/2012  . HTN (hypertension) 01/22/2011  . HYPERTROPHY PROSTATE W/O UR OBST & OTH LUTS 10/16/2008  . Hyperglycemia 10/16/2008  . Hyperlipidemia 06/15/2006  . Mitral valve disease 06/15/2006  . ALLERGIC RHINITIS 06/15/2006  . COLON CANCER, HX OF 06/15/2006   Past Medical History:  Past Medical History:  Diagnosis Date  . Allergy   . Anxiety   . Arthritis    hands  . Atrial fibrillation ([place])   . Colon cancer [facility])    s/p partial colectomy 1990, Cscope neg 7-09, next 2014  . DISORDER, MITRAL VALVE    MV recontruction at [place] 2006----still needs ABX for SBE prophylaxis   . Elevated PSA    [name]  . GERD (gastroesophageal reflux disease)   . Heart murmur    no problems since surgery  . HYPERGLYCEMIA, BORDERLINE 10/16/2008  . HYPERLIPIDEMIA 06/15/2006   diet controlled  . Hypertension   . HYPERTROPHY PROSTATE W/O UR OBST & OTH LUTS 10/16/2008  . Neuropathy   . Right groin pain    Dr. Redmond Pulling  . SCC (squamous  cell carcinoma) 06/2017   s/p Mohs---[name]  . Stroke ([place])   . Wears partial dentures    lower partial   Past Surgical History:  Past Surgical History:  Procedure Laterality Date  . COLECTOMY  1990  . COLONOSCOPY    . HERNIA REPAIR Left 2014   [facility] rep w/mesh- March 2014  . LOOP RECORDER INSERTION N/A 12/01/2016   Procedure: LOOP RECORDER INSERTION;  Surgeon: Evans Lance, MD;  Location: [facility];  Service: Cardiovascular;  Laterality: N/A;  . MITRAL VALVE ANNULOPLASTY     [facility] 2006  . TEE WITHOUT CARDIOVERSION N/A 10/05/2016   Procedure: TRANSESOPHAGEAL ECHOCARDIOGRAM (TEE);  Surgeon: [name], MD;  Location: [facility] ENDOSCOPY;  Service: Cardiovascular;  Laterality: N/A;   HPI:  Pt is a 77 y/o male admitted secondary to R arm numbness. MRI revealed Acute infarct left hippocampus, occlusion left posterior cerebral artery which could be acute. PMH including but not limited to a-fib, colon cancer, HTN, stroke.   Assessment / Plan / Recommendation Clinical Impression   Patient presents with decline in cognitive function since most recent assessment ~1 year ago. Pt scored 19/30 on Clark (score on different version of this assessment was 26/30 last year, within normal limits). Pt endorses history of mild memory and wordfinding difficulties after previous CVAs. He was initially unaware of increased difficulties this admission, but when confronted with errors (problem solving, attention), pt agrees he does have some cognitive changes. SLP educated re: functional implications of cognitive changes and encouraged  pt to have family member assist with medications and cooking at least initially. Pt would benefit from follow-up with ST for cognitive deficits and for training in compensations for memory deficits.     SLP Assessment  SLP Recommendation/Assessment: All further Speech Lanaguage Pathology  needs can be addressed in the next venue of care SLP Visit Diagnosis:  Cognitive communication deficit (R41.841);Aphasia (R47.01)    Follow Up Recommendations  Home health SLP;Outpatient SLP    Frequency and Duration           SLP Evaluation Cognition  Overall Cognitive Status: Impaired/Different from baseline Arousal/Alertness: Awake/alert Orientation Level: Oriented X4 Attention: Focused;Sustained;Selective;Alternating Focused Attention: Appears intact Sustained Attention: Appears intact Selective Attention: Appears intact Alternating Attention: Impaired Alternating Attention Impairment: Verbal basic;Functional basic Memory: Impaired Memory Impairment: Storage deficit;Decreased recall of new information;Decreased short term memory(delayed recall 0/5 even with category cue) Decreased Short Term Memory: Verbal basic;Functional basic Awareness: Impaired Awareness Impairment: Emergent impairment;Anticipatory impairment Problem Solving: Impaired Problem Solving Impairment: Verbal basic(slow processing, verbal cues) Safety/Judgment: Impaired Comments: decreased awareness       Comprehension  Auditory Comprehension Overall Auditory Comprehension: Appears within functional limits for tasks assessed Yes/No Questions: Within Functional Limits Visual Recognition/Discrimination Discrimination: Within Function Limits Reading Comprehension Reading Status: Not tested    Expression Expression Primary Mode of Expression: Verbal Verbal Expression Overall Verbal Expression: Impaired at baseline Initiation: No impairment Level of Generative/Spontaneous Verbalization: Conversation Repetition: No impairment Naming: Impairment Confrontation: Impaired Divergent: 50-74% accurate(9 fruits in 60 seconds) Verbal Errors: Semantic paraphasias Pragmatics: No impairment Interfering Components: Attention Written Expression Dominant Hand: Right Written Expression: Not tested   Oral / Motor  Oral Motor/Sensory Function Overall Oral Motor/Sensory Function:  Within functional limits Motor Speech Overall Motor Speech: Appears within functional limits for tasks assessed Respiration: Within functional limits Phonation: Normal Resonance: Within functional limits Articulation: Within functional limitis Intelligibility: Intelligible   GO                   Deneise Lever, [place], [place] Pager: [phone] Office: [phone]  Aliene Altes 12/04/2017, 2:38 PM

## 2017-12-04 NOTE — Discharge Summary (Signed)
Physician Discharge Summary  Kevin Bass JYN:[id number] DOB: [birth year] DOA: 12/02/2017  PCP: Colon Branch, MD  Admit date: 12/02/2017 Discharge date: 12/04/2017  Admitted From: Home Disposition:  Home  Recommendations for Outpatient Follow-up:  1. Follow up with PCP in 1-2 weeks  Discharge Condition:Stable CODE STATUS:Full Diet recommendation: Heart healthy   Brief/Interim Summary: 77 y.o.malewith medical history significant ofA. fib on Eliquis,CVA,HLD, colon cancer s/ppartial colectomy, arthritis;who presents with complaints of numbness. Patient had been on the couch watching TV and reportedly dozedoff for a short period of time. When he awoke he had numbness in his right arm from his wrist to his elbow. He tried rubbing his arm, but symptoms seem to last approximately 10 minutes or so before self resolving. He checked his blood pressure noted that it was elevated. Associated symptoms included feeling woozy like he was off balance while he was walking around initially and slowed response. This time he reports all symptoms have resolved. Denies having any headache, change in speech, facial droop, palpitations, chest pain, nausea, vomiting, fever, chills, leg swelling, or alcohol use. Patient reports that he had just undergone ulnar and carpal tunnel surgery 6 days ago. Patient has been compliant with all of his medications.  ED Course:Upon admission at at the emergency department patient was noted to be afebrile vital signs relatively within normal limits. Labs including CBC and BMP, and troponin were unremarkable. Urinalysis was negative for any signs of infection. CT scan of the brain showed no acute stroke and anold right cerebellar stroke.. Dr. Lorraine Lax of neurology was consulted and recommended transfer for completion of stroke work-up.  Acute L Hippocampus CVA with acute L posterior cerabral artery -MRI reviewed. Chronic bilateral infarcts also noted, suggesting  embolic source in setting of afib. Discussed with Neurology -Neurology already consulted and is following -2d echo performed, read, unremarkable -bilateral dopplers without occlusion -LDL of 120, increase crestor to 40mg  -A1c ordered, 5.3, reviewed   Paroxysmal atrial fibrillation on chronic anticoagulation, history of mitral valve repair: Patient currently in sinus rhythm. -Continue Eliquis as tolerated per Neurology recommendations - Reportedly compliant with meds  History of CVA -Continue statin as tolerated - Dose increased to 40mg   Essential hypertension -Continue metoprolol and losartan as tolerated  Neuropathy -Continue gabapentin as tolerated  Hyperlipidemia: Labs from 8/22 noted total cholesterol 190, HDL 37, LDL 120, triglycerides 159 -Continue Zetia and Crestor as per above   Discharge Diagnoses:  Principal Problem:   Paresthesias Active Problems:   Hyperlipidemia   HTN (hypertension)   Paroxysmal atrial fibrillation (HCC)   TIA (transient ischemic attack)   Chronic anticoagulation   History of CVA (cerebrovascular accident)    Discharge Instructions   Allergies as of 12/04/2017      Reactions   Statins Other (See Comments)   Myalgias (intolerance)      Medication List    TAKE these medications   ALPRAZolam 0.25 MG tablet Commonly known as:  XANAX TAKE ONE OR TWO TABLETS BY MOUTH TWICE DAILY AS NEEDED FOR ANXIETY   Arginine 500 MG Caps Take 1,500 mg by mouth daily.   ELIQUIS 5 MG Tabs tablet Generic drug:  apixaban TAKE ONE TABLET BY MOUTH TWICE DAILY   ezetimibe 10 MG tablet Commonly known as:  ZETIA Take 1 tablet (10 mg total) by mouth daily.   FISH OIL PO Take 1,000 mg by mouth daily.   gabapentin 300 MG capsule Commonly known as:  NEURONTIN Take 300 mg by mouth 3 (three) times daily as needed.  HYDROcodone-acetaminophen 5-325 MG tablet Commonly known as:  NORCO/VICODIN Take 1 tablet by mouth every 6 (six) hours as  needed for moderate pain.   JOINT HEALTH PO Take 1 tablet by mouth daily. JARROW JOINT BUILDER   loratadine 10 MG tablet Commonly known as:  CLARITIN Take 10 mg by mouth at bedtime.   losartan 25 MG tablet Commonly known as:  COZAAR Take 1 tablet (25 mg total) by mouth 2 (two) times daily.   LYCOPENE PO Take 1 capsule by mouth 2 (two) times daily. JARROW FORMULA LYCO-SORB PROSTATE SUPPORT   metoprolol succinate 25 MG 24 hr tablet Commonly known as:  TOPROL-XL Take 1 tablet (25 mg total) by mouth daily.   OCUVITE PRESERVISION PO Take 1 tablet by mouth 2 (two) times daily.   rosuvastatin 40 MG tablet Commonly known as:  CRESTOR Take 1 tablet (40 mg total) by mouth daily. What changed:    medication strength  how much to take      Follow-up Information    Colon Branch, MD. Schedule an appointment as soon as possible for a visit in 2 week(s).   Specialty:  Internal Medicine Contact information: [address] [place] [address] [phone]        Evans Lance, MD .   Specialty:  Cardiology Contact information: [id number] [place] [address] [phone]          Allergies  Allergen Reactions  . Statins Other (See Comments)    Myalgias (intolerance)    Consultations:  Neurology  Procedures/Studies: Ct Head Wo Contrast  Result Date: 12/03/2017 CLINICAL DATA:  77 year old male with unsteadiness in his gait. Right arm numbness. Prior history of strokes. EXAM: CT HEAD WITHOUT CONTRAST TECHNIQUE: Contiguous axial images were obtained from the base of the skull through the vertex without intravenous contrast. COMPARISON:  CT the abdomen and pelvis 02/21/2017. FINDINGS: Brain: Again noted are some well-defined areas of low attenuation in the right cerebellar hemisphere related to remote right PICA territory infarction. No evidence of acute infarction, hemorrhage, hydrocephalus, extra-axial collection or mass  lesion/mass effect. Vascular: No hyperdense vessel or unexpected calcification. Skull: Normal. Negative for fracture or focal lesion. Sinuses/Orbits: No acute finding. Other: None. IMPRESSION: 1. No acute intracranial abnormalities. 2. Old right cerebellar hemisphere infarct redemonstrated. Electronically Signed   By: Vinnie Langton M.D.   On: 12/03/2017 00:55   Mr Jodene Nam Head Wo Contrast  Result Date: 12/03/2017 CLINICAL DATA:  TIA. Right arm weakness and numbness. Atrial fibrillation on Eliquis. EXAM: MRI HEAD WITHOUT CONTRAST MRA HEAD WITHOUT CONTRAST TECHNIQUE: Multiplanar, multiecho pulse sequences of the brain and surrounding structures were obtained without intravenous contrast. Angiographic images of the head were obtained using MRA technique without contrast. COMPARISON:  CT head 12/03/2017 FINDINGS: MRI HEAD FINDINGS Brain: Acute infarct left hippocampus with restricted diffusion. Chronic infarct left parietal operculum. Chronic infarct right cerebellum. Cerebral white matter intact. Negative for mass lesion. Negative for hemorrhage. Ventricle size normal. Vascular: Normal arterial flow voids Skull and upper cervical spine: Negative Sinuses/Orbits: Mild mucosal edema paranasal sinuses.  Normal orbit Other: None MRA HEAD FINDINGS Both vertebral arteries patent to the basilar. Basilar tortuous but patent. PICA patent bilaterally. The superior cerebellar arteries patent bilaterally. Right posterior cerebral artery widely patent. Occlusion of the left posterior cerebral artery proximally Internal carotid artery patent bilaterally without stenosis. Anterior and middle cerebral arteries patent bilaterally without stenosis. Incidental azygos anterior cerebral artery. IMPRESSION: Acute infarct left hippocampus.  Occlusion left posterior cerebral artery which could be acute. Chronic infarct right cerebellum and left parietal operculum. Electronically Signed   By: Franchot Gallo M.D.   On: 12/03/2017 13:03   Mr  Brain Wo Contrast  Result Date: 12/03/2017 CLINICAL DATA:  TIA. Right arm weakness and numbness. Atrial fibrillation on Eliquis. EXAM: MRI HEAD WITHOUT CONTRAST MRA HEAD WITHOUT CONTRAST TECHNIQUE: Multiplanar, multiecho pulse sequences of the brain and surrounding structures were obtained without intravenous contrast. Angiographic images of the head were obtained using MRA technique without contrast. COMPARISON:  CT head 12/03/2017 FINDINGS: MRI HEAD FINDINGS Brain: Acute infarct left hippocampus with restricted diffusion. Chronic infarct left parietal operculum. Chronic infarct right cerebellum. Cerebral white matter intact. Negative for mass lesion. Negative for hemorrhage. Ventricle size normal. Vascular: Normal arterial flow voids Skull and upper cervical spine: Negative Sinuses/Orbits: Mild mucosal edema paranasal sinuses.  Normal orbit Other: None MRA HEAD FINDINGS Both vertebral arteries patent to the basilar. Basilar tortuous but patent. PICA patent bilaterally. The superior cerebellar arteries patent bilaterally. Right posterior cerebral artery widely patent. Occlusion of the left posterior cerebral artery proximally Internal carotid artery patent bilaterally without stenosis. Anterior and middle cerebral arteries patent bilaterally without stenosis. Incidental azygos anterior cerebral artery. IMPRESSION: Acute infarct left hippocampus. Occlusion left posterior cerebral artery which could be acute. Chronic infarct right cerebellum and left parietal operculum. Electronically Signed   By: Franchot Gallo M.D.   On: 12/03/2017 13:03     Subjective: Eager to go home  Discharge Exam: Vitals:   12/04/17 0830 12/04/17 1100  BP:  (!) 144/78  Pulse: 69 62  Resp:    Temp:    SpO2:     Vitals:   12/04/17 0405 12/04/17 0747 12/04/17 0830 12/04/17 1100  BP: 113/82 132/79  (!) 144/78  Pulse: 61 (!) 59 69 62  Resp:  18    Temp: 98.6 F (37 C) 98 F (36.7 C)    TempSrc: Oral Oral    SpO2:  96%     Weight:      Height:        General: Pt is alert, awake, not in acute distress Cardiovascular: RRR, S1/S2 +, no rubs, no gallops Respiratory: CTA bilaterally, no wheezing, no rhonchi Abdominal: Soft, NT, ND, bowel sounds + Extremities: no edema, no cyanosis   The results of significant diagnostics from this hospitalization (including imaging, microbiology, ancillary and laboratory) are listed below for reference.     Microbiology: No results found for this or any previous visit (from the past 240 hour(s)).   Labs: BNP (last 3 results) No results for input(s): BNP in the last 8760 hours. Basic Metabolic Panel: Recent Labs  Lab 12/02/17 2358  NA 140  K 3.7  CL 103  CO2 28  GLUCOSE 121*  BUN 22  CREATININE 1.01  CALCIUM 9.2   Liver Function Tests: No results for input(s): AST, ALT, ALKPHOS, BILITOT, PROT, ALBUMIN in the last 168 hours. No results for input(s): LIPASE, AMYLASE in the last 168 hours. No results for input(s): AMMONIA in the last 168 hours. CBC: Recent Labs  Lab 12/02/17 2358  WBC 5.7  NEUTROABS 3.3  HGB 14.0  HCT 42.8  MCV 86.5  PLT 179   Cardiac Enzymes: Recent Labs  Lab 12/02/17 2358  TROPONINI <0.03   BNP: Invalid input(s): POCBNP CBG: No results for input(s): GLUCAP in the last 168 hours. D-Dimer No results for input(s): DDIMER in the last 72 hours. Hgb A1c No results for input(s):  HGBA1C in the last 72 hours. Lipid Profile No results for input(s): CHOL, HDL, LDLCALC, TRIG, CHOLHDL, LDLDIRECT in the last 72 hours. Thyroid function studies Recent Labs    12/03/17 0626  TSH 1.850   Anemia work up No results for input(s): VITAMINB12, FOLATE, FERRITIN, TIBC, IRON, RETICCTPCT in the last 72 hours. Urinalysis    Component Value Date/Time   COLORURINE YELLOW 12/03/2017 0132   APPEARANCEUR CLEAR 12/03/2017 0132   LABSPEC 1.010 12/03/2017 0132   PHURINE 7.5 12/03/2017 0132   GLUCOSEU NEGATIVE 12/03/2017 0132   HGBUR NEGATIVE  12/03/2017 0132   BILIRUBINUR NEGATIVE 12/03/2017 0132   KETONESUR NEGATIVE 12/03/2017 0132   PROTEINUR NEGATIVE 12/03/2017 0132   NITRITE NEGATIVE 12/03/2017 0132   LEUKOCYTESUR NEGATIVE 12/03/2017 0132   Sepsis Labs Invalid input(s): PROCALCITONIN,  WBC,  LACTICIDVEN Microbiology No results found for this or any previous visit (from the past 240 hour(s)).  Time spent: 30 min  SIGNED:   Marylu Lund, MD  [facility] 12/04/2017, 12:57 PM  If 7PM-7AM, please contact night-coverage

## 2017-12-05 ENCOUNTER — Telehealth: Payer: Self-pay

## 2017-12-05 NOTE — Telephone Encounter (Signed)
thx

## 2017-12-05 NOTE — Telephone Encounter (Signed)
12/05/17  Transition Care Management Follow-up Telephone Call  ADMISSION DATE: 12/02/17  DISCHARGE DATE: 12/04/17  How have you been since you were released from the hospital?  Still has some weakness.   Do you understand why you were in the hospital? Yes.   Do you understand the discharge instrcutions? Yes.    Items Reviewed: Medications reviewed:  Yes, taking Gabapentin different than written discharge orders. Not taking some of the suppliments listed.   Allergies reviewed: Yes  Dietary changes reviewed: Heart healthy  Referrals reviewed: Appointment scheduled  Functional Questionnaire:   Activities of Daily Living (ADLs): Patient states he has help with ADL's at this time.  Any patient concerns? Stroke in general, and medications.   Confirmed importance and date/time of follow-up visits scheduled: Yes   Confirmed with patient if condition begins to worsen call PCP or go to the ER. Yes   Patient was given the office number and encouragred to call back with questions or concerns.Yers

## 2017-12-06 ENCOUNTER — Encounter: Payer: Self-pay | Admitting: Neurology

## 2017-12-06 ENCOUNTER — Other Ambulatory Visit: Payer: Self-pay

## 2017-12-06 ENCOUNTER — Telehealth: Payer: Self-pay | Admitting: Neurology

## 2017-12-06 ENCOUNTER — Ambulatory Visit (INDEPENDENT_AMBULATORY_CARE_PROVIDER_SITE_OTHER): Payer: PPO | Admitting: Neurology

## 2017-12-06 VITALS — BP 118/72 | HR 75 | Ht 70.0 in | Wt 180.0 lb

## 2017-12-06 DIAGNOSIS — G629 Polyneuropathy, unspecified: Secondary | ICD-10-CM | POA: Diagnosis not present

## 2017-12-06 DIAGNOSIS — I69398 Other sequelae of cerebral infarction: Secondary | ICD-10-CM | POA: Diagnosis not present

## 2017-12-06 DIAGNOSIS — H2513 Age-related nuclear cataract, bilateral: Secondary | ICD-10-CM | POA: Diagnosis not present

## 2017-12-06 DIAGNOSIS — H524 Presbyopia: Secondary | ICD-10-CM | POA: Diagnosis not present

## 2017-12-06 DIAGNOSIS — I639 Cerebral infarction, unspecified: Secondary | ICD-10-CM | POA: Diagnosis not present

## 2017-12-06 DIAGNOSIS — H53461 Homonymous bilateral field defects, right side: Secondary | ICD-10-CM | POA: Diagnosis not present

## 2017-12-06 DIAGNOSIS — H538 Other visual disturbances: Secondary | ICD-10-CM | POA: Diagnosis not present

## 2017-12-06 DIAGNOSIS — H532 Diplopia: Secondary | ICD-10-CM | POA: Diagnosis not present

## 2017-12-06 DIAGNOSIS — H5203 Hypermetropia, bilateral: Secondary | ICD-10-CM | POA: Diagnosis not present

## 2017-12-06 MED ORDER — GABAPENTIN 300 MG PO CAPS: ORAL_CAPSULE | ORAL | 6 refills | Status: DC

## 2017-12-06 NOTE — Progress Notes (Signed)
NEUROLOGY FOLLOW UP OFFICE NOTE  Kevin Bass [id number] 1940/04/22  HISTORY OF PRESENT ILLNESS: I had the pleasure of seeing Kevin Bass in follow-up in the neurology clinic on 12/06/2017. He is accompanied by his daughter who helps supplement the history today. He was last seen 4 months ago for compressive neuropathy at the right elbow and wrist, L5 radiculopathy. He was initially seen for recurrent strokes and takes Eliquis for atrial fibrillation. Since his last visit, he continued to report symptoms after physical therapy and gabapentin, and saw Ortho. He underwent right median nerve and ulnar nerve decompression on 11/28/17. He had stopped the Eliquis for 2 days for the procedure, and restarted Eliquis on 11/29/17. On 12/02/17, he took a nap and woke up with right arm weakness and numbness. He could not lift his right arm up. After 20 minutes, he started getting more feeling in his arm. Per records, he stood up and noticed his gait was off. The weakness improved after several minutes, but he continued to feel off balance. He went to the ER where SBP was elevated at 170. NIH 0 in the hospital. Records were reviewed, he had an MRI brain without contrast showing an acute infarct in the left hippocampus, occlusion of the left PCA which could be acute. There were chronic infarcts in the right cerebellum and left parietal operculum. He had an echo which showed an EF of 55-60%, left atrium normal, right atrium moderately dilated. No significant stenosis on carotid dopplers. LDL in August 2019 was 120, he was advised to increase Crestor to 15m daily. He was having muscle pains and stopped statin for 5 months, he was restarted Crestor with Zetia last August.  He was discharged home 2 days ago and reports that this is the worst of his strokes. It has affected his memory more than anything. He has also noticed vision changes on the right visual field, he describes blurred/double vision reading on his  right side. He feels his strength is back but his balance is still off. He also has left foot pain. He has been dealing with his wife and sister's recent deaths.   HPI 10/01/2016:  This is a pleasant 77yo RH man with a history of colon cancer, hyperlipidemia, previous mitral valve repair in his usual state of health until 09/11/16 after taking a walk, he saw his brother-in-law and when he started noticing word-finding difficulties. His wife reported that his speech was not necessarily slurred but he was having trouble finding his words. When symptoms were unchanged the next day, he decided to go to the ER. He was still noted to have mild word-finding difficulties. There was no focal numbness/tingling/weakness. He denied any headaches, dizziness, diplopia, dysarthria/dysphagia. I personally reviewed MRI brain without contrast which showed an acute infarct in the left posterior insula and parietal operculum. MRA head and carotid dopplers did not show any significant stenosis. Echocardiogram showed EF 60-65%, mild LVH, prior MV repair without significant stenosis or regurgitation, normal LA size. He was switched from aspirin to Plavix. LDL was 124, HbA1c was 5.6. He was unable to get a TEE in the hospital, and has seen Dr. HDebara Pickettand agreed to having the procedure done on 8/14 followed by loop recorder. He denies any palpitations, chest pain, shortness of breath. No neck/back pain, bowel/bladder dysfunction. No falls. He denies any side effect on Plavix. He was started on Crestor yesterday. He feels he is 95% or more back to baseline, he denies any confusion or focal symptoms.  He reports a history of facial pain on the right upper lip radiating up his cheek with good response to gabapentin. He takes 37m qhs with no side effects.  Update 12/07/16: He had no residual deficits from prior stroke, then on 11/29/16 he had sudden onset vertigo that made him fall backward. This lasted for 15 seconds and was followed by a  mild headache. He called triage and was instructed to go seek medical attention the next day. He went to the ER and was found to have a right PICA territory stroke. He was evaluated by Neurology with no focal deficits seen. He reports he was taking aspirin and Plavix when the stroke occurred. He had an extensive workup for the prior stroke, he had a TEE in August 2018 which showed normal left atrium, no thrombus seen. He had a 48-hour monitor which was unremarkable. In the hospital, he had a CTA of the head and neck which did not show any occlusion or stenosis. Repeat TTE showed EF of 55-60%, left atrium mildly dilated. He had a CT chest/abdomen/pelvis which did not show any evidence of malignancy. On hospital discharge, he was instructed to continue aspirin and Plavix and stop his antihypertensives. He had a loop recorder placed.  Diagnostic Data: Neuropathy labs were normal (TSH, B12, ESR, folate, SPEP/IFE). He had an EMG/NCV of the right UE and LE which showed right ulnar neuropathy with slowing across the elbow, axon loss and demyelinating in type, mild to moderate in degree; right median neuropathy at or distal to the wrist, consistent with carpal tunnel, mild in degree, and probable L5 radiculopathy, mild. No evidence of a large fiber sensorimotor polyneuropathy.    PAST MEDICAL HISTORY: Past Medical History:  Diagnosis Date  . Allergy   . Anxiety   . Arthritis    hands  . [place]   . [facility] ([facility]    s/p partial colectomy 1990, Cscope neg 7-09, next 2014  . DISORDER, MITRAL VALVE    [id number]----still needs ABX for SBE prophylaxis   . Elevated PSA    Dr BAlinda Money . GERD (gastroesophageal reflux disease)   . Heart murmur    no problems since surgery  . HYPERGLYCEMIA, BORDERLINE 10/16/2008  . HYPERLIPIDEMIA 06/15/2006   diet controlled  . Hypertension   . HYPERTROPHY PROSTATE W/O UR OBST & OTH LUTS 10/16/2008  . Neuropathy   . Right groin pain     Dr. WRedmond Pulling . SCC (squamous cell carcinoma) 06/2017   s/p Mohs---Dr BMilana Na . Stroke ([place]   . Wears partial dentures    lower partial    MEDICATIONS: Current Outpatient Medications on File Prior to Visit  Medication Sig Dispense Refill  . ALPRAZolam (XANAX) 0.25 MG tablet TAKE ONE OR TWO TABLETS BY MOUTH TWICE DAILY AS NEEDED FOR ANXIETY  (Patient taking differently: Take 0.25 mg by mouth 2 (two) times daily as needed for anxiety. ) 40 tablet 1  . Arginine 500 MG CAPS Take 1,500 mg by mouth daily.    .Marland Kitchenascorbic acid (VITAMIN C) 500 MG/5ML syrup Take 1,000 mg by mouth 2 (two) times daily.    .Marland KitchenELIQUIS 5 MG TABS tablet TAKE ONE TABLET BY MOUTH TWICE DAILY  180 tablet 1  . ezetimibe (ZETIA) 10 MG tablet Take 1 tablet (10 mg total) by mouth daily. 90 tablet 1  . gabapentin (NEURONTIN) 300 MG capsule Take 300-900 mg by mouth See admin instructions. Taking 2 capsules (603m in  the Am and 3 capsules (926m) in the evening    . Glucosamine-MSM-Hyaluronic Acd (JOINT HEALTH PO) Take 1 tablet by mouth daily. [place]   . HYDROcodone-acetaminophen (NORCO/VICODIN) 5-325 MG tablet Take 1 tablet by mouth every 6 (six) hours as needed for moderate pain.    .Marland Kitchenloratadine (CLARITIN) 10 MG tablet Take 10 mg by mouth at bedtime.     .Marland Kitchenlosartan (COZAAR) 25 MG tablet Take 1 tablet (25 mg total) by mouth 2 (two) times daily. 60 tablet 5  . LYCOPENE PO Take 1 capsule by mouth 2 (two) times daily. JARROW FORMULA LYCO-SORB PROSTATE SUPPORT     . metoprolol succinate (TOPROL-XL) 25 MG 24 hr tablet Take 1 tablet (25 mg total) by mouth daily. 90 tablet 3  . Multiple Vitamins-Minerals (OCUVITE PRESERVISION PO) Take 1 tablet by mouth 2 (two) times daily.     . Omega-3 Fatty Acids (FISH OIL PO) Take 1,000 mg by mouth daily.     . rosuvastatin (CRESTOR) 40 MG tablet Take 1 tablet (40 mg total) by mouth daily. 30 tablet 0   No current facility-administered medications on file prior to visit.      ALLERGIES: Allergies  Allergen Reactions  . Statins Other (See Comments)    Myalgias (intolerance)    FAMILY HISTORY: Family History  Problem Relation Age of Onset  . Colon cancer Other        M side (cousins)  . Diabetes Neg Hx   . Stroke Neg Hx   . CAD Neg Hx   . Prostate cancer Neg Hx   . Rectal cancer Neg Hx   . Esophageal cancer Neg Hx     SOCIAL HISTORY: Social History   Socioeconomic History  . Marital status: Widowed    Spouse name: suzanne  . Number of children: 2  . Years of education: college  . Highest education level: Not on file  Occupational History  . Occupation: retired   SScientific laboratory technician . Financial resource strain: Not on file  . Food insecurity:    Worry: Not on file    Inability: Not on file  . Transportation needs:    Medical: Not on file    Non-medical: Not on file  Tobacco Use  . Smoking status: Former Smoker    Packs/day: 0.50    Years: 20.00    Pack years: 10.00    Types: Cigarettes    Last attempt to quit: 02/23/1983    Years since quitting: 34.8  . Smokeless tobacco: Never Used  Substance and Sexual Activity  . Alcohol use: Yes    Alcohol/week: 7.0 standard drinks    Types: 7 Glasses of wine per week  . Drug use: No  . Sexual activity: Not on file  Lifestyle  . Physical activity:    Days per week: Not on file    Minutes per session: Not on file  . Stress: Not on file  Relationships  . Social connections:    Talks on phone: Not on file    Gets together: Not on file    Attends religious service: Not on file    Active member of club or organization: Not on file    Attends meetings of clubs or organizations: Not on file    Relationship status: Not on file  . Intimate partner violence:    Fear of current or ex partner: Not on file    Emotionally abused: Not on file    Physically abused: Not  on file    Forced sexual activity: Not on file  Other Topics Concern  . Not on file  Social History Narrative   From [place], lost  wife 08/2017 to cancer   daughter in [place], son in [place]: Constitutional: No fevers, chills, or sweats, no generalized fatigue, change in appetite Eyes: No visual changes, double vision, eye pain Ear, nose and throat: No hearing loss, ear pain, nasal congestion, sore throat Cardiovascular: No chest pain, palpitations Respiratory:  No shortness of breath at rest or with exertion, wheezes GastrointestinaI: No nausea, vomiting, diarrhea, abdominal pain, fecal incontinence Genitourinary:  No dysuria, urinary retention or frequency Musculoskeletal:  + neck pain, back pain Integumentary: No rash, pruritus, skin lesions Neurological: as above Psychiatric: No depression, insomnia, anxiety Endocrine: No palpitations, fatigue, diaphoresis, mood swings, change in appetite, change in weight, increased thirst Hematologic/Lymphatic:  No anemia, purpura, petechiae. Allergic/Immunologic: no itchy/runny eyes, nasal congestion, recent allergic reactions, rashes  PHYSICAL EXAM: Vitals:   12/06/17 1145  BP: 118/72  Pulse: 75  SpO2: 97%   General: No acute distress, flat affect and slowed responses but answers appropriately Head:  Normocephalic/atraumatic Neck: supple, no paraspinal tenderness, full range of motion Heart:  Regular rate and rhythm Lungs:  Clear to auscultation bilaterally Back: No paraspinal tenderness Skin/Extremities: No rash, no edema Neurological Exam: alert and oriented to person, place, and time. No aphasia or dysarthria. Fund of knowledge is appropriate.  Recent memory impaired, 0/3 delayed recall. Attention and concentration are normal, 4/5 spelling WORLD backward. Able to name objects and repeat phrases. Cranial nerves: Pupils equal, round, reactive to light.  Extraocular movements intact with no nystagmus. Visual fields: appears to have right homonymous hemianopsia, he has difficulty counting fingers on the right side, reporting blurred vision. Facial  sensation intact. No facial asymmetry. Tongue, uvula, palate midline.  Motor: Bulk and tone normal, muscle strength 5/5 throughout, no pronator drift.  Sensation: intact to light touch. Deep tendon reflexes +1 right UE, +2 left UE and bilateral patellar reflexes, absent ankle jerks bilaterally (similar to prior).Finger to nose testing intact.  Gait narrow-based and steady, able to tandem walk adequately.  Romberg negative.  IMPRESSION: This is a pleasant 77 yo RH man with a history of colon cancer, hyperlipidemia, previous mitral valve repair with recurrent strokes secondary to atrial fibrillation. He had another left PCA stroke affecting the left hippocampus last 12/02/17. He was off Eliquis for 2 days for median and ulnar nerve decompression, and had restarted it 3 days before the stroke. Stroke felt to be due to atrial fibrillation. He has had 2 prior strokes with left MCA branch infarct last July 2018 with transient mild aphasia, then a right PICA territory infarct with transient vertigo last 11/29/16. Loop recorder captured atrial fibrillation. He continues to paresthesias and pain on his right arm and feels no change with recent surgery, we discussed that it is still early post-op to determine long-term response.Continue gabapentin 640m in AM, 9076min PM. We discussed recurrent strokes, continue Eliquis and control of vascular risk factors. He is reporting more memory changes, which correlates with hippocampal stroke, proceed with Speech Therapy for cognitive therapy, as well as occupational therapy for right homonymous hemianopia.He is more withdrawn today, we discussed post-stroke depression, as well as recent deaths of his wife and sister, consider starting SSRI. He will ask about driving with his ophthalmologist, but would hold off for now. He knows to go to the ER for  any sudden change in symptoms and will follow-up in 3 months.   Thank you for allowing me to participate in his care.  Please do not  hesitate to call for any questions or concerns.  The duration of this appointment visit was 30 minutes of face-to-face time with the patient.  Greater than 50% of this time was spent in counseling, explanation of diagnosis, planning of further management, and coordination of care.   Ellouise Newer, M.D.   CC: Dr. [name]

## 2017-12-06 NOTE — Telephone Encounter (Signed)
error 

## 2017-12-06 NOTE — Patient Instructions (Addendum)
1. Continue all your medications 2. Start occupational therapy and speech therapy 3. Continue to monitor mood 4. Follow-up with eye doctor and ask about driving as well 5. For any sudden change in symptoms, go to ER immediately 6. Follow-up in 3 months, call for any changes

## 2017-12-12 DIAGNOSIS — Z9889 Other specified postprocedural states: Secondary | ICD-10-CM | POA: Diagnosis not present

## 2017-12-13 ENCOUNTER — Ambulatory Visit (INDEPENDENT_AMBULATORY_CARE_PROVIDER_SITE_OTHER): Payer: PPO | Admitting: Internal Medicine

## 2017-12-13 ENCOUNTER — Encounter: Payer: Self-pay | Admitting: Internal Medicine

## 2017-12-13 VITALS — BP 122/78 | HR 54 | Temp 98.4°F | Resp 16 | Ht 70.0 in | Wt 181.1 lb

## 2017-12-13 DIAGNOSIS — I480 Paroxysmal atrial fibrillation: Secondary | ICD-10-CM | POA: Diagnosis not present

## 2017-12-13 DIAGNOSIS — E7800 Pure hypercholesterolemia, unspecified: Secondary | ICD-10-CM | POA: Diagnosis not present

## 2017-12-13 DIAGNOSIS — G459 Transient cerebral ischemic attack, unspecified: Secondary | ICD-10-CM | POA: Diagnosis not present

## 2017-12-13 DIAGNOSIS — F329 Major depressive disorder, single episode, unspecified: Secondary | ICD-10-CM | POA: Diagnosis not present

## 2017-12-13 DIAGNOSIS — Z8673 Personal history of transient ischemic attack (TIA), and cerebral infarction without residual deficits: Secondary | ICD-10-CM | POA: Diagnosis not present

## 2017-12-13 LAB — COMPREHENSIVE METABOLIC PANEL
ALT: 15 U/L (ref 0–53)
AST: 14 U/L (ref 0–37)
Albumin: 4.3 g/dL (ref 3.5–5.2)
Alkaline Phosphatase: 46 U/L (ref 39–117)
BUN: 16 mg/dL (ref 6–23)
CALCIUM: 9.3 mg/dL (ref 8.4–10.5)
CHLORIDE: 105 meq/L (ref 96–112)
CO2: 33 mEq/L — ABNORMAL HIGH (ref 19–32)
CREATININE: 0.96 mg/dL (ref 0.40–1.50)
GFR: 80.24 mL/min (ref >60.00)
Glucose, Bld: 95 mg/dL (ref 70–99)
Potassium: 4.7 mEq/L (ref 3.5–5.1)
SODIUM: 141 meq/L (ref 135–145)
Total Bilirubin: 0.6 mg/dL (ref 0.2–1.2)
Total Protein: 6.7 g/dL (ref 6.0–8.3)

## 2017-12-13 LAB — CBC WITH DIFFERENTIAL/PLATELET
BASOS ABS: 0 10*3/uL (ref 0.0–0.1)
BASOS PCT: 0.6 % (ref 0.0–3.0)
EOS ABS: 0.2 10*3/uL (ref 0.0–0.7)
Eosinophils Relative: 2.7 % (ref 0.0–5.0)
HCT: 41 % (ref 39.0–52.0)
HEMOGLOBIN: 14.2 g/dL (ref 13.0–17.0)
LYMPHS PCT: 27.2 % (ref 12.0–46.0)
Lymphs Abs: 1.5 10*3/uL (ref 0.7–4.0)
MCHC: 34.7 g/dL (ref 30.0–36.0)
MCV: 84.6 fl (ref 78.0–100.0)
MONO ABS: 0.4 10*3/uL (ref 0.1–1.0)
Monocytes Relative: 6.7 % (ref 3.0–12.0)
NEUTROS ABS: 3.6 10*3/uL (ref 1.4–7.7)
Neutrophils Relative %: 62.8 % (ref 43.0–77.0)
PLATELETS: 224 10*3/uL (ref 150.0–400.0)
RBC: 4.85 Mil/uL (ref 4.22–5.81)
RDW: 13.5 % (ref 11.5–15.5)
WBC: 5.7 10*3/uL (ref 4.0–10.5)

## 2017-12-13 MED ORDER — ROSUVASTATIN CALCIUM 40 MG PO TABS: 40.0000 mg | ORAL_TABLET | Freq: Every day | ORAL | 1 refills | Status: DC

## 2017-12-13 NOTE — Progress Notes (Signed)
Pre visit review using our clinic review tool, if applicable. No additional management support is needed unless otherwise documented below in the visit note. 

## 2017-12-13 NOTE — Patient Instructions (Signed)
GO TO THE LAB : Get the blood work     GO TO THE FRONT DESK Schedule your next appointment for a  Check up, fasting in 2 months   Consider talk to a counselor

## 2017-12-13 NOTE — Progress Notes (Addendum)
Subjective:    Patient ID: Kevin Bass, male    DOB: 11-Oct-1940, 77 y.o.   MRN: [id number]  DOS:  12/13/2017 Type of visit - description : TCM 14 Interval history: Admitted to the hospital and discharged 12/04/2017. Symptoms were numbness on the right arm, by the time of the ER evalaution symptoms resolved. Initial CT head of the brain negative, MRI: Acute infarct left hippocampus. Occlusion left posterior cerebral artery which could be acute. Chronic infarct right cerebellum and left parietal operculum. Saw neurology, echo unremarkable, carotid ultrasound 1 to 39% bilaterally, LDL was elevated, Crestor increased to 40 mg.  Subsequently saw neurology. They noted that prior to the event he held Eliquis 2 days for elective surgery. Pt reported some memory changes , neuro  recommended a speech therapy and occupational therapy. Depression also noted   Review of Systems Currently doing about the same. Denies chest pain, palpitations, headache or any bleeding. No suicidal ideas.  Past Medical History:  Diagnosis Date  . Allergy   . Anxiety   . Arthritis    hands  . Atrial fibrillation ([place])   . Colon cancer [facility])    s/p partial colectomy 1990, Cscope neg 7-09, next 2014  . DISORDER, MITRAL VALVE    MV recontruction at [place] 2006----still needs ABX for SBE prophylaxis   . Elevated PSA    [name]  . GERD (gastroesophageal reflux disease)   . Heart murmur    no problems since surgery  . HYPERGLYCEMIA, BORDERLINE 10/16/2008  . HYPERLIPIDEMIA 06/15/2006   diet controlled  . Hypertension   . HYPERTROPHY PROSTATE W/O UR OBST & OTH LUTS 10/16/2008  . Neuropathy   . Right groin pain    Dr. Redmond Pulling  . SCC (squamous cell carcinoma) 06/2017   s/p Mohs---[name]  . Stroke ([place])   . Wears partial dentures    lower partial    Past Surgical History:  Procedure Laterality Date  . COLECTOMY  1990  . COLONOSCOPY    . HERNIA REPAIR Left 2014   [facility] rep w/mesh- March 2014    . LOOP RECORDER INSERTION N/A 12/01/2016   Procedure: LOOP RECORDER INSERTION;  Surgeon: Evans Lance, MD;  Location: [facility] CV LAB;  Service: Cardiovascular;  Laterality: N/A;  . MITRAL VALVE ANNULOPLASTY     [facility] 2006  . TEE WITHOUT CARDIOVERSION N/A 10/05/2016   Procedure: TRANSESOPHAGEAL ECHOCARDIOGRAM (TEE);  Surgeon: [name], MD;  Location: [facility] ENDOSCOPY;  Service: Cardiovascular;  Laterality: N/A;    Social History   Socioeconomic History  . Marital status: Widowed    Spouse name: suzanne  . Number of children: 2  . Years of education: college  . Highest education level: Not on file  Occupational History  . Occupation: retired   Scientific laboratory technician  . Financial resource strain: Not on file  . Food insecurity:    Worry: Not on file    Inability: Not on file  . Transportation needs:    Medical: Not on file    Non-medical: Not on file  Tobacco Use  . Smoking status: Former Smoker    Packs/day: 0.50    Years: 20.00    Pack years: 10.00    Types: Cigarettes    Last attempt to quit: 02/23/1983    Years since quitting: 34.8  . Smokeless tobacco: Never Used  Substance and Sexual Activity  . Alcohol use: Yes    Alcohol/week: 7.0 standard drinks    Types: 7 Glasses of wine  per week  . Drug use: No  . Sexual activity: Not Currently  Lifestyle  . Physical activity:    Days per week: Not on file    Minutes per session: Not on file  . Stress: Not on file  Relationships  . Social connections:    Talks on phone: Not on file    Gets together: Not on file    Attends religious service: Not on file    Active member of club or organization: Not on file    Attends meetings of clubs or organizations: Not on file    Relationship status: Not on file  . Intimate partner violence:    Fear of current or ex partner: Not on file    Emotionally abused: Not on file    Physically abused: Not on file    Forced sexual activity: Not on file  Other Topics Concern  . Not on file   Social History Narrative   From [place], lost wife 08/2017 to cancer   daughter in [place], son in [place] as of 12/13/2017      Reactions   Statins Other (See Comments)   Myalgias (intolerance)      Medication List        Accurate as of 12/13/17 11:59 PM. Always use your most recent med list.          ALPRAZolam 0.25 MG tablet Commonly known as:  XANAX TAKE ONE OR TWO TABLETS BY MOUTH TWICE DAILY AS NEEDED FOR ANXIETY   Arginine 500 MG Caps Take 1,500 mg by mouth daily.   ascorbic acid 500 MG/5ML syrup Commonly known as:  VITAMIN C Take 1,000 mg by mouth 2 (two) times daily.   ELIQUIS 5 MG Tabs tablet Generic drug:  apixaban TAKE ONE TABLET BY MOUTH TWICE DAILY   ezetimibe 10 MG tablet Commonly known as:  ZETIA Take 1 tablet (10 mg total) by mouth daily.   FISH OIL PO Take 1,000 mg by mouth daily.   gabapentin 300 MG capsule Commonly known as:  NEURONTIN Take 2 capsules (600mg ) in the Am and 3 capsules (900mg ) in the evening   JOINT HEALTH PO Take 1 tablet by mouth daily. JARROW JOINT BUILDER   loratadine 10 MG tablet Commonly known as:  CLARITIN Take 10 mg by mouth at bedtime.   losartan 25 MG tablet Commonly known as:  COZAAR Take 1 tablet (25 mg total) by mouth 2 (two) times daily.   LYCOPENE PO Take 1 capsule by mouth 2 (two) times daily. JARROW FORMULA LYCO-SORB PROSTATE SUPPORT   metoprolol succinate 25 MG 24 hr tablet Commonly known as:  TOPROL-XL Take 1 tablet (25 mg total) by mouth daily.   OCUVITE PRESERVISION PO Take 1 tablet by mouth 2 (two) times daily.   rosuvastatin 40 MG tablet Commonly known as:  CRESTOR Take 1 tablet (40 mg total) by mouth daily.          Objective:   Physical Exam BP 122/78 (BP Location: Left Arm, Patient Position: Sitting, Cuff Size: Small)   Pulse (!) 54   Temp 98.4 F (36.9 C) (Oral)   Resp 16   Ht 5\' 10"  (1.778 m)   Wt 181 lb 2 oz (82.2 kg)   SpO2 98%   BMI 25.99 kg/m  General:    Well developed, NAD, see BMI.  HEENT:  Normocephalic . Face symmetric, atraumatic Lungs:  CTA B Normal respiratory effort, no intercostal retractions,  no accessory muscle use. Heart: RRR,  no murmur.  No pretibial edema bilaterally  Skin: Not pale. Not jaundice Neurologic:  alert & oriented X3.  Speech slightly slow, motor symmetric.  gait appropriate for age and unassisted Psych--  Cognition and judgment appear intact.  Cooperative with normal attention span and concentration.  Behavior appropriate. No anxious or depressed appearing.      Assessment & Plan:   Assessment Prediabetes HTN Hyperlipidemia, statin intolerant (simva-pravachol: shoulder pain) Anxiety- rarely takes xanax Neuroy --Facial neuropathy dx 2015, no w/u, sx resurface x1 after temporary d/c of meds  --saw neuro again 02/2017, labs (-), had a NCS, Rx gaba CV: --Mitral valve annuloplasty 2006-- needs SBE prophylaxis -- SOB 04-2016, seen at [facility], had a ECHO Nl fx MV, cath: no CAD --Cryptogenic stroke 09/12/2016, had slurred speech. Was recommended a loop recorder.started plavix -- new R PICA stroke, admitted 11-30-16, implanted loop recorder placed --A-FIB noted on ILR ~ 12-21-2016, changed to eliquis  Elevated PSA and BPH-- Dr. [name] Colon cancer partial colectomy 1990, colonoscopy 08-2007 negative, Cscope again 09-2012 normal, 5 years  SCC . MOHS 06/2017  PLAN:  Acute the stroke: In the context of holding Eliquis for elective surgery.    He reports that this particular stroke has been very hard on him, he has occasional diplopia, decreased memory, decreased coordination, not driving.  Feeling somewhat depressed about his loss of independence.  Neurology already recommended speech therapy and physical therapy.  Continue Eliquis. Afib: check CMP CBC Depression: Counseled to the best of my ability, provided counselor contact members and knows to call if he thinks a SSRI is needed HTN: Continue losartan,  metoprolol. High cholesterol: He was put back on Crestor 40 mg at the time of the admission for the stroke because the last LDL was 120.  He is somewhat reluctant to take high-dose crestor, for now I strongly recommend to stay on Crestor 40 mg, if aches-pains again  we could decrease the dose to 20 mg.Rec to check labs in 2 months. Declined a flu shot. RTC 2 months.

## 2017-12-14 ENCOUNTER — Other Ambulatory Visit: Payer: Self-pay | Admitting: Internal Medicine

## 2017-12-14 MED ORDER — ROSUVASTATIN CALCIUM 40 MG PO TABS: 40.0000 mg | ORAL_TABLET | Freq: Every day | ORAL | 1 refills | Status: DC

## 2017-12-14 NOTE — Assessment & Plan Note (Signed)
Acute the stroke: In the context of holding Eliquis for elective surgery.    He reports that this particular stroke has been very hard on him, he has occasional diplopia, decreased memory, decreased coordination, not driving.  Feeling somewhat depressed about his loss of independence.  Neurology already recommended speech therapy and physical therapy.  Continue Eliquis. Afib: check CMP CBC Depression: Counseled to the best of my ability, provided counselor contact members and knows to call if he thinks a SSRI is needed HTN: Continue losartan, metoprolol. High cholesterol: He was put back on Crestor 40 mg at the time of the admission for the stroke because the last LDL was 120.  He is somewhat reluctant to take high-dose crestor, for now I strongly recommend to stay on Crestor 40 mg, if aches-pains again  we could decrease the dose to 20 mg.Rec to check labs in 2 months. Declined a flu shot. RTC 2 months.

## 2017-12-20 DIAGNOSIS — M7752 Other enthesopathy of left foot: Secondary | ICD-10-CM | POA: Diagnosis not present

## 2017-12-20 DIAGNOSIS — M25572 Pain in left ankle and joints of left foot: Secondary | ICD-10-CM | POA: Diagnosis not present

## 2017-12-20 DIAGNOSIS — M205X2 Other deformities of toe(s) (acquired), left foot: Secondary | ICD-10-CM | POA: Diagnosis not present

## 2017-12-20 DIAGNOSIS — M21962 Unspecified acquired deformity of left lower leg: Secondary | ICD-10-CM | POA: Diagnosis not present

## 2017-12-23 ENCOUNTER — Telehealth: Payer: Self-pay | Admitting: Internal Medicine

## 2017-12-23 NOTE — Telephone Encounter (Signed)
Covering for [employer].  BP has looked great the last 2 times it was checked in our office.  I would not recommend change in his meds but that he come for a nurse visit next week for recheck and further recommendations in our office.   BP Readings from Last 3 Encounters:  12/13/17 122/78  12/06/17 118/72  12/04/17 (!) 144/78

## 2017-12-23 NOTE — Telephone Encounter (Signed)
Please advise 

## 2017-12-23 NOTE — Telephone Encounter (Signed)
Spoke w/ Pt- informed of recommendations. Pt states BP's mainly high in afternoon's. We scheduled nurse visit for 12/27/2017 at 0930 (this was the only time available next week). Instructed Pt to bring BP monitor w/ him. Pt verbalized understanding.

## 2017-12-23 NOTE — Telephone Encounter (Signed)
Copied from [place] [id number]. Topic: General - Other >> Dec 23, 2017 12:56 PM Leward Quan A wrote: Reason for CRM: Patient called to say that his blood pressure is elevated 155/82 last checked. Patient want to know if he can increase the dose of his medication. Ph# [phone] asking to get a call back by 5 pm if possible

## 2017-12-27 ENCOUNTER — Ambulatory Visit (INDEPENDENT_AMBULATORY_CARE_PROVIDER_SITE_OTHER): Payer: PPO | Admitting: Family Medicine

## 2017-12-27 VITALS — BP 114/73 | HR 75

## 2017-12-27 DIAGNOSIS — I10 Essential (primary) hypertension: Secondary | ICD-10-CM

## 2017-12-27 NOTE — Progress Notes (Addendum)
Pre visit review using our clinic tool,if applicable. No additional management support is needed unless otherwise documented below in the visit note.   Pre visit review using our clinic tool,if applicable. No additional management support is needed unless otherwise documented below in the visit note.   Pt here for Blood pressure check per order from Debbrah Alar NP.  Pt currently takes:Losartan 25 mg bid, Metoprolol 25 mg daily.   Pt states he takes Losartin at 8am and 8pm, Metoprolol at 10 am.   States around 2-3 pm during the day he feels unstable.  BP today @ = 114/73 HR = 75  Home monitor= 126/72  P=76  Pt advised per Dr. Charlett Blake to take Metoprolol 25 mg at qhs instead of 10 am and Losartan at 8 am and 8 pm like he has been taking.   Patient states he disagrees with Dr. Charlett Blake states he refuses to change the way he takes medications. States he will call back with the BP readings at the end of the week and schedule follow up appointment sooner.    Nursing note reviewed. Agree with documention and plan.

## 2018-01-09 ENCOUNTER — Encounter: Payer: Self-pay | Admitting: Internal Medicine

## 2018-01-09 ENCOUNTER — Ambulatory Visit (INDEPENDENT_AMBULATORY_CARE_PROVIDER_SITE_OTHER): Payer: PPO | Admitting: Internal Medicine

## 2018-01-09 ENCOUNTER — Other Ambulatory Visit: Payer: Self-pay | Admitting: Internal Medicine

## 2018-01-09 VITALS — BP 126/74 | HR 77 | Temp 98.4°F | Resp 16 | Ht 70.0 in | Wt 181.0 lb

## 2018-01-09 DIAGNOSIS — F419 Anxiety disorder, unspecified: Secondary | ICD-10-CM

## 2018-01-09 DIAGNOSIS — Z79899 Other long term (current) drug therapy: Secondary | ICD-10-CM | POA: Diagnosis not present

## 2018-01-09 DIAGNOSIS — F329 Major depressive disorder, single episode, unspecified: Secondary | ICD-10-CM | POA: Diagnosis not present

## 2018-01-09 DIAGNOSIS — E785 Hyperlipidemia, unspecified: Secondary | ICD-10-CM

## 2018-01-09 DIAGNOSIS — Z8673 Personal history of transient ischemic attack (TIA), and cerebral infarction without residual deficits: Secondary | ICD-10-CM | POA: Diagnosis not present

## 2018-01-09 DIAGNOSIS — I480 Paroxysmal atrial fibrillation: Secondary | ICD-10-CM

## 2018-01-09 LAB — LIPID PANEL
CHOL/HDL RATIO: 2
Cholesterol: 94 mg/dL (ref 0–200)
HDL: 41.3 mg/dL (ref >39.00)
LDL Cholesterol: 29 mg/dL (ref 0–99)
NONHDL: 52.6
Triglycerides: 116 mg/dL (ref 0.0–149.0)
VLDL: 23.2 mg/dL (ref 0.0–40.0)

## 2018-01-09 LAB — ALT: ALT: 17 U/L (ref 0–53)

## 2018-01-09 LAB — AST: AST: 17 U/L (ref 0–37)

## 2018-01-09 MED ORDER — METOPROLOL SUCCINATE ER 25 MG PO TB24: 37.5000 mg | ORAL_TABLET | Freq: Every day | ORAL | 3 refills | Status: DC

## 2018-01-09 NOTE — Patient Instructions (Addendum)
Please schedule Medicare Wellness with Glenard Haring.   GO TO THE LAB : Get the blood work     GO TO THE FRONT DESK Schedule your next appointment for a checkup in 3 or 4 months    Check the  blood pressure   daily Be sure your blood pressure is between 110/65 and  135/85. If it is consistently higher or lower, let me know

## 2018-01-09 NOTE — Progress Notes (Signed)
Pre visit review using our clinic review tool, if applicable. No additional management support is needed unless otherwise documented below in the visit note. 

## 2018-01-09 NOTE — Assessment & Plan Note (Signed)
HTN: Occasionally, BPs go high in the afternoon, increase metoprolol XL from 25 mg to 37.5 mg daily, continue losartan.  Last BMP satisfactory Hyperlipidemia: Seems to be doing well with Crestor and Zetia, check a FLP, AST, ALT. Anxiety, depression: About the same, declined medication other than Xanax, UDS and contract today. agrees to see a counselor that he knows. Recent stroke: never received  ST/PT as rec by neuro, nevertheless is improving, driving. Will refer to [place] a flu shot ; encouraged to get Shingrix No. 2 at the pharmacy. RTC 3- 4 months

## 2018-01-09 NOTE — Progress Notes (Signed)
Subjective:    Patient ID: Kevin Bass, male    DOB: 1940-10-05, 77 y.o.   MRN: [id number]  DOS:  01/09/2018 Type of visit - description : f/u In general feels well. Still somewhat sad and depressed due to the recent stroke and the loss of his wife. Good compliance with cholesterol medications, no new aches and pains HTN: Ambulatory BPs are very good in the morning slightly high during the afternoon, around 140, 150.   Review of Systems   Past Medical History:  Diagnosis Date  . Allergy   . Anxiety   . Arthritis    hands  . Atrial fibrillation ([place])   . Colon cancer [facility])    s/p partial colectomy 1990, Cscope neg 7-09, next 2014  . DISORDER, MITRAL VALVE    MV recontruction at [place] 2006----still needs ABX for SBE prophylaxis   . Elevated PSA    [name]  . GERD (gastroesophageal reflux disease)   . Heart murmur    no problems since surgery  . HYPERGLYCEMIA, BORDERLINE 10/16/2008  . HYPERLIPIDEMIA 06/15/2006   diet controlled  . Hypertension   . HYPERTROPHY PROSTATE W/O UR OBST & OTH LUTS 10/16/2008  . Neuropathy   . Right groin pain    Dr. Redmond Pulling  . SCC (squamous cell carcinoma) 06/2017   s/p Mohs---[name]  . Stroke ([place])   . Wears partial dentures    lower partial    Past Surgical History:  Procedure Laterality Date  . COLECTOMY  1990  . HERNIA REPAIR Left 2014   [facility] rep w/mesh- March 2014  . LOOP RECORDER INSERTION N/A 12/01/2016   Procedure: LOOP RECORDER INSERTION;  Surgeon: Evans Lance, MD;  Location: [place] CV LAB;  Service: Cardiovascular;  Laterality: N/A;  . MITRAL VALVE ANNULOPLASTY     [facility] 2006  . TEE WITHOUT CARDIOVERSION N/A 10/05/2016   Procedure: TRANSESOPHAGEAL ECHOCARDIOGRAM (TEE);  Surgeon: [name], MD;  Location: [facility] ENDOSCOPY;  Service: Cardiovascular;  Laterality: N/A;    Social History   Socioeconomic History  . Marital status: Widowed    Spouse name: suzanne  . Number of children: 2  . Years of  education: college  . Highest education level: Not on file  Occupational History  . Occupation: retired   Scientific laboratory technician  . Financial resource strain: Not on file  . Food insecurity:    Worry: Not on file    Inability: Not on file  . Transportation needs:    Medical: Not on file    Non-medical: Not on file  Tobacco Use  . Smoking status: Former Smoker    Packs/day: 0.50    Years: 20.00    Pack years: 10.00    Types: Cigarettes    Last attempt to quit: 02/23/1983    Years since quitting: 34.9  . Smokeless tobacco: Never Used  Substance and Sexual Activity  . Alcohol use: Yes    Alcohol/week: 7.0 standard drinks    Types: 7 Glasses of wine per week  . Drug use: No  . Sexual activity: Not Currently  Lifestyle  . Physical activity:    Days per week: Not on file    Minutes per session: Not on file  . Stress: Not on file  Relationships  . Social connections:    Talks on phone: Not on file    Gets together: Not on file    Attends religious service: Not on file    Active member of club or organization:  Not on file    Attends meetings of clubs or organizations: Not on file    Relationship status: Not on file  . Intimate partner violence:    Fear of current or ex partner: Not on file    Emotionally abused: Not on file    Physically abused: Not on file    Forced sexual activity: Not on file  Other Topics Concern  . Not on file  Social History Narrative   From [place], lost wife 08/2017 to cancer   daughter in [place], son in [place] as of 01/09/2018      Reactions   Statins Other (See Comments)   Myalgias (intolerance)      Medication List        Accurate as of 01/09/18  8:03 PM. Always use your most recent med list.          ALPRAZolam 0.25 MG tablet Commonly known as:  XANAX TAKE ONE OR TWO TABLETS BY MOUTH TWICE DAILY AS NEEDED FOR ANXIETY   Arginine 500 MG Caps Take 1,500 mg by mouth daily.   ascorbic acid 500 MG/5ML syrup Commonly known as:   VITAMIN C Take 1,000 mg by mouth 2 (two) times daily.   ELIQUIS 5 MG Tabs tablet Generic drug:  apixaban TAKE ONE TABLET BY MOUTH TWICE DAILY   ezetimibe 10 MG tablet Commonly known as:  ZETIA Take 1 tablet (10 mg total) by mouth daily.   FISH OIL PO Take 1,000 mg by mouth daily.   gabapentin 300 MG capsule Commonly known as:  NEURONTIN Take 2 capsules (600mg ) in the Am and 3 capsules (900mg ) in the evening   JOINT HEALTH PO Take 1 tablet by mouth daily. JARROW JOINT BUILDER   loratadine 10 MG tablet Commonly known as:  CLARITIN Take 10 mg by mouth at bedtime.   losartan 25 MG tablet Commonly known as:  COZAAR Take 1 tablet (25 mg total) by mouth 2 (two) times daily.   LYCOPENE PO Take 1 capsule by mouth 2 (two) times daily. JARROW FORMULA LYCO-SORB PROSTATE SUPPORT   metoprolol succinate 25 MG 24 hr tablet Commonly known as:  TOPROL-XL Take 1.5 tablets (37.5 mg total) by mouth daily.   OCUVITE PRESERVISION PO Take 1 tablet by mouth 2 (two) times daily.   rosuvastatin 40 MG tablet Commonly known as:  CRESTOR Take 1 tablet (40 mg total) by mouth daily.           Objective:   Physical Exam BP 126/74 (BP Location: Left Arm, Patient Position: Sitting, Cuff Size: Small)   Pulse 77   Temp 98.4 F (36.9 C) (Oral)   Resp 16   Ht 5\' 10"  (1.778 m)   Wt 181 lb (82.1 kg)   SpO2 97%   BMI 25.97 kg/m  General:   Well developed, NAD, BMI noted. HEENT:  Normocephalic . Face symmetric, atraumatic Lungs:  CTA B Normal respiratory effort, no intercostal retractions, no accessory muscle use. Heart: RRR,  no murmur.  No pretibial edema bilaterally  Skin: Not pale. Not jaundice Neurologic:  alert & oriented X3.  Speech normal, gait appropriate for age and unassisted Psych--  Cognition and judgment appear intact.  Cooperative with normal attention span and concentration.  Behavior appropriate. No anxious or depressed appearing.      Assessment & Plan:     Assessment Prediabetes HTN Hyperlipidemia, statin intolerant (simva-pravachol: shoulder pain) Anxiety- rarely takes xanax Neuro  --Facial neuropathy  dx 2015, no w/u, sx resurface x1 after temporary d/c of meds  --saw neuro again 02/2017, labs (-), had a NCS, Rx gaba CV: --Mitral valve annuloplasty 2006-- needs SBE prophylaxis -- SOB 04-2016, seen at [facility], had a ECHO Nl fx MV, cath: no CAD --Cryptogenic stroke 09/12/2016, had slurred speech. 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 [name] Colon cancer partial colectomy 1990, colonoscopy 08-2007 negative, Cscope again 09-2012 normal, 5 years  SCC . MOHS 06/2017  PLAN:  HTN: Occasionally, BPs go high in the afternoon, increase metoprolol XL from 25 mg to 37.5 mg daily, continue losartan.  Last BMP satisfactory Hyperlipidemia: Seems to be doing well with Crestor and Zetia, check a FLP, AST, ALT. Anxiety, depression: About the same, declined medication other than Xanax, UDS and contract today. agrees to see a counselor that he knows. Recent stroke: never received  ST/PT as rec by neuro, nevertheless is improving, driving. Will refer to [place] a flu shot ; encouraged to get Shingrix No. 2 at the pharmacy. RTC 3- 4 months    Today, I spent more than 25  min with the patient: >50% of the time counseling regards anxiety-depression, providing support , explaining benefits of counseling

## 2018-01-10 LAB — PAIN MGMT, PROFILE 8 W/CONF, U
6 Acetylmorphine: NEGATIVE ng/mL (ref <10)
AMPHETAMINES: NEGATIVE ng/mL (ref <500)
Alcohol Metabolites: NEGATIVE ng/mL (ref <500)
BENZODIAZEPINES: NEGATIVE ng/mL (ref <100)
Buprenorphine, Urine: NEGATIVE ng/mL (ref <5)
COCAINE METABOLITE: NEGATIVE ng/mL (ref <150)
CREATININE: 206.8 mg/dL
MDMA: NEGATIVE ng/mL (ref <500)
Marijuana Metabolite: NEGATIVE ng/mL (ref <20)
OXIDANT: NEGATIVE ug/mL (ref <200)
OXYCODONE: NEGATIVE ng/mL (ref <100)
Opiates: NEGATIVE ng/mL (ref <100)
pH: 6.49 (ref 4.5–9.0)

## 2018-01-11 DIAGNOSIS — N402 Nodular prostate without lower urinary tract symptoms: Secondary | ICD-10-CM | POA: Diagnosis not present

## 2018-01-13 DIAGNOSIS — Z9889 Other specified postprocedural states: Secondary | ICD-10-CM | POA: Diagnosis not present

## 2018-01-24 ENCOUNTER — Ambulatory Visit: Payer: PPO | Attending: Internal Medicine | Admitting: Physical Therapy

## 2018-01-24 ENCOUNTER — Encounter: Payer: Self-pay | Admitting: Physical Therapy

## 2018-01-24 DIAGNOSIS — R2681 Unsteadiness on feet: Secondary | ICD-10-CM | POA: Insufficient documentation

## 2018-01-24 DIAGNOSIS — R2689 Other abnormalities of gait and mobility: Secondary | ICD-10-CM | POA: Insufficient documentation

## 2018-01-24 NOTE — Therapy (Addendum)
[facility] [address]  [place], [place], [address] Phone: [phone]   Fax:  [phone]  Physical Therapy Evaluation/Discharge Addendum  Patient Details  Name: Kevin Bass MRN: [id number] Date of Birth: 04/05/1940 Referring Provider (PT): Kathlene November MD   Encounter Date: 01/24/2018  PT End of Session - 01/24/18 1648    Visit Number  1    Number of Visits  4    Date for PT Re-Evaluation  02/23/17    Authorization Type  HTA MCR    PT Start Time  1400    PT Stop Time  1445    PT Time Calculation (min)  45 min    Activity Tolerance  Patient tolerated treatment well    Behavior During Therapy  [facility] for tasks assessed/performed       Past Medical History:  Diagnosis Date  . Allergy   . Anxiety   . Arthritis    hands  . Atrial fibrillation ([place])   . Colon cancer [facility])    s/p partial colectomy 1990, Cscope neg 7-09, next 2014  . DISORDER, MITRAL VALVE    [facility]----still needs ABX for SBE prophylaxis   . Elevated PSA    [name]  . GERD (gastroesophageal reflux disease)   . Heart murmur    no problems since surgery  . HYPERGLYCEMIA, BORDERLINE 10/16/2008  . HYPERLIPIDEMIA 06/15/2006   diet controlled  . Hypertension   . HYPERTROPHY PROSTATE W/O UR OBST & OTH LUTS 10/16/2008  . Neuropathy   . Right groin pain    Dr. Redmond Pulling  . SCC (squamous cell carcinoma) 06/2017   s/p Mohs---[name]  . Stroke ([place])   . Wears partial dentures    lower partial    Past Surgical History:  Procedure Laterality Date  . COLECTOMY  1990  . HERNIA REPAIR Left 2014   [facility] rep w/mesh- March 2014  . LOOP RECORDER INSERTION N/A 12/01/2016   Procedure: LOOP RECORDER INSERTION;  Surgeon: Evans Lance, MD;  Location: [place] CV LAB;  Service: Cardiovascular;  Laterality: N/A;  . MITRAL VALVE ANNULOPLASTY     [facility] 2006  . TEE WITHOUT CARDIOVERSION N/A 10/05/2016   Procedure: TRANSESOPHAGEAL  ECHOCARDIOGRAM (TEE);  Surgeon: [name], MD;  Location[facility] ENDOSCOPY;  Service: Cardiovascular;  Laterality: N/A;    There were no vitals filed for this visit.   Subjective Assessment - 01/24/18 1407    Subjective  Pt relays he is here for PT due to CVA X 3, most recent in Oct 2019. Since then he is having some difficulty with Rt hand, with his feet, with balance, and with memory.     Patient is accompained by:  Family member    Pertinent History  PMH: Afib, loop recorder,,anx,dep,hyperlipemia,colon ca with partial colectomy, mitral valve reconstruct,    Limitations  Lifting;Standing;Walking;House hold activities    How long can you stand comfortably?  20 min    Diagnostic tests  MRI of brain after CVA, see report    Patient Stated Goals  learn what to work on at home    Currently in Pain?  Yes    Pain Score  3     Pain Location  Foot    Pain Orientation  Left    Pain Descriptors / Indicators  Aching    Pain Type  Chronic pain    Pain Onset  More than a month ago  Pain Frequency  Intermittent    Aggravating Factors   depends    Pain Relieving Factors  rest         [facility] PT Assessment - 01/24/18 0001      Assessment   Medical Diagnosis  CVA X 3, most recent in Oct 2019.    Referring Provider (PT)  Kathlene November MD    Onset Date/Surgical Date  --   October 2019 latest CVA   Hand Dominance  Right    Next MD Visit  05/09/17    Prior Therapy  PT after first CVA, no PT until now for this latest CVA      Precautions   Precautions  None      Balance Screen   Has the patient fallen in the past 6 months  No    Has the patient had a decrease in activity level because of a fear of falling?   No    Is the patient reluctant to leave their home because of a fear of falling?   No      Home Film/video editor residence    Additional Comments  one flight of stairs with landing, handrail on Lt going up      Prior Function   Level of Independence   Independent      Cognition   Overall Cognitive Status  --   reports memory deficits, able to recall 3 items 5 min later     Observation/Other Assessments   Focus on Therapeutic Outcomes (FOTO)   not done, one time visit for now      Sensation   Light Touch  Appears Intact      Coordination   Gross Motor Movements are Fluid and Coordinated  Yes    Finger Nose Finger Test  good    Heel Shin Test  good      ROM / Strength   AROM / PROM / Strength  AROM;Strength      AROM   Overall AROM   Within functional limits for tasks performed      Strength   Overall Strength  Within functional limits for tasks performed    Overall Strength Comments  LE and UE strength 5/5 grossly except ankle DF 4+/5 bilat      Transfers   Transfers  Independent with all Transfers      Ambulation/Gait   Gait Comments  WFL but decreased arm swing on Rt, good velocity and step length      Balance   Balance Assessed  Yes      Standardized Balance Assessment   Standardized Balance Assessment  Berg Balance Test;Dynamic Gait Index;Five Times Sit to Stand    Five times sit to stand comments   10.5 sec      Berg Balance Test   Sit to Stand  Able to stand without using hands and stabilize independently    Standing Unsupported  Able to stand safely 2 minutes    Sitting with Back Unsupported but Feet Supported on Floor or Stool  Able to sit safely and securely 2 minutes    Stand to Sit  Sits safely with minimal use of hands    Transfers  Able to transfer safely, minor use of hands    Standing Unsupported with Eyes Closed  Able to stand 10 seconds with supervision    Standing Ubsupported with Feet Together  Able to place feet together independently and stand for 1 minute with supervision  From Standing, Reach Forward with Outstretched Arm  Can reach confidently >25 cm (10")    From Standing Position, Pick up Object from [place] to pick up shoe safely and easily    From Standing Position, Turn to Look  Behind Over each Shoulder  Looks behind from both sides and weight shifts well    Turn 360 Degrees  Able to turn 360 degrees safely in 4 seconds or less    Standing Unsupported, Alternately Place Feet on Step/Stool  Able to stand independently and safely and complete 8 steps in 20 seconds    Standing Unsupported, One Foot in Front  Able to plae foot ahead of the other independently and hold 30 seconds    Standing on One Leg  Tries to lift leg/unable to hold 3 seconds but remains standing independently    Total Score  50      Dynamic Gait Index   Level Surface  Normal    Change in Gait Speed  Normal    Gait with Horizontal Head Turns  Mild Impairment    Gait with Vertical Head Turns  Normal    Gait and Pivot Turn  Normal    Step Over Obstacle  Normal    Step Around Obstacles  Normal    Steps  Normal    Total Score  23                Objective measurements completed on examination: See above findings.      [place] Adult PT Treatment/Exercise - 01/24/18 0001      High Level Balance   High Level Balance Comments  tandem walk, heel walk, toe walk, 20 ft X 2 ea, SLS 3-10 sec X 5 ea             PT Education - 01/24/18 1646    Education Details  HEP, POC    Person(s) Educated  Patient    Methods  Explanation;Demonstration;Verbal cues;Handout    Comprehension  Verbalized understanding;Returned demonstration          PT Long Term Goals - 01/24/18 1655      PT LONG TERM GOAL #1   Title  PT will be I and compliant with HEP and was provided written copy. Met today    Time  4    Period  Weeks    Status  New    Target Date  02/23/17      PT LONG TERM GOAL #2   Title  Pt will improve BERG balance score by 2 points.     Baseline  50    Time  4    Period  Weeks    Status  New    Target Date  02/23/17             Plan - 01/24/18 1649    Clinical Impression Statement  Pt referred to PT following CVA X 3, most recent in Oct 2019. He has made an excellent  recovery and has good strength and good balance tests today. BERG balance test, Dynamic Gait Index, and 5 times sit to stand tests do not place him at an increased risk for falls. He only as mild Rt foot pain and mild reported memory problems but he was able to recall 3 random items given today after 5 minutes. He did have decreased peformance with SLS and tandem stance thus he was given HEP for higher level balance activities. PT does not feel he needs regular therapy  at this time but will hold this episode open 30 days in the event he regresses or wishes to return.     History and Personal Factors relevant to plan of care:  PMH: Afib, loop recorder,,anx,dep,hyperlipemia,colon ca with partial colectomy, mitral valve reconstruct,    Clinical Presentation  Stable    Clinical Decision Making  Low    Rehab Potential  Excellent    PT Frequency  One time visit   1 time week at most but will trial HEP first and call back if he feels he needs to return   PT Duration  4 weeks    PT Treatment/Interventions  Cryotherapy;Moist Heat;Gait training;Stair training;Therapeutic activities;Therapeutic exercise;Balance training;Neuromuscular re-education    PT Next Visit Plan  high level balance if he returns    Consulted and Agree with Plan of Care  Patient       Patient will benefit from skilled therapeutic intervention in order to improve the following deficits and impairments:  Decreased activity tolerance, Decreased balance, Pain  Visit Diagnosis: Unsteadiness on feet  Other abnormalities of gait and mobility     Problem List Patient Active Problem List   Diagnosis Date Noted  . TIA (transient ischemic attack) 12/03/2017  . Chronic anticoagulation 12/03/2017  . H/O: stroke 12/03/2017  . Paroxysmal atrial fibrillation ([place]) 07/11/2017  . SCC (squamous cell carcinoma) 06/22/2017  . Varicose veins of both lower extremities 06/19/2017  . Bilateral lower extremity edema 06/19/2017  . Paresthesias  03/03/2017  . Trigeminal neuralgia of right side of face 10/01/2016  . History of mitral valve repair 09/30/2016  . Cerebrovascular accident (CVA) ([place]) 09/12/2016  . GERD (gastroesophageal reflux disease) 09/22/2015  . PCP NOTES >>>>> 11/08/2014  . Chest pain 06/07/2014  . Facial neuropathy 04/02/2013  . Anxiety and depression 02/26/2013  . Annual physical exam 08/30/2012  . HTN (hypertension) 01/22/2011  . HYPERTROPHY PROSTATE W/O UR OBST & OTH LUTS 10/16/2008  . Hyperglycemia 10/16/2008  . Hyperlipidemia 06/15/2006  . Mitral valve disease 06/15/2006  . ALLERGIC RHINITIS 06/15/2006  . COLON CANCER, HX OF 06/15/2006    Debbe Odea, PT,DPT 01/24/2018, 4:57 PM  PHYSICAL THERAPY DISCHARGE SUMMARY  Visits from Start of Care: 1  Current functional level related to goals / functional outcomes: See above   Remaining deficits: See above   Education / Equipment: HEP Plan: Patient agrees to discharge.  Patient goals were partially met. Patient is being discharged due to being pleased with the current functional level.  ?????   Pt did not feel he needed PT, and PT agreed.  He was placed on a 30 day hold in case he regressed and needed to return. He has not and now will be Discharged. Elsie Ra, PT, DPT 02/24/18 9:43 AM     [facility] [address]  [place], [place], [address] Phone: [phone]   Fax:  [phone]  Name: Kevin Bass MRN: [id number] Date of Birth: 11/20/1940

## 2018-01-24 NOTE — Patient Instructions (Signed)
Access Code: [email or web address]  URL: [email or web address]  Date: 01/24/2018  Prepared by: Elsie Ra   Exercises  Single Leg Stance - 10 reps - 3 sets - 2x daily - 6x weekly  Sit to Stand without Arm Support - 10 reps - 1-2 sets - 5 hold - 2x daily - 6x weekly  Walking Tandem Stance - 10 reps - 3 sets - 2x daily - 6x weekly  Heel Walking - 10 reps - 3 sets - 2x daily - 6x weekly  Toe Walking - 10 reps - 3 sets - 2x daily - 6x weekly

## 2018-01-30 ENCOUNTER — Ambulatory Visit (INDEPENDENT_AMBULATORY_CARE_PROVIDER_SITE_OTHER): Payer: PPO

## 2018-01-30 DIAGNOSIS — I639 Cerebral infarction, unspecified: Secondary | ICD-10-CM

## 2018-01-31 ENCOUNTER — Encounter: Payer: Self-pay | Admitting: Internal Medicine

## 2018-02-01 ENCOUNTER — Encounter: Payer: Self-pay | Admitting: Internal Medicine

## 2018-02-01 ENCOUNTER — Telehealth: Payer: Self-pay

## 2018-02-01 ENCOUNTER — Ambulatory Visit (INDEPENDENT_AMBULATORY_CARE_PROVIDER_SITE_OTHER): Payer: PPO | Admitting: Internal Medicine

## 2018-02-01 VITALS — BP 124/62 | HR 61 | Temp 98.2°F | Resp 16 | Ht 70.0 in | Wt 181.4 lb

## 2018-02-01 DIAGNOSIS — F329 Major depressive disorder, single episode, unspecified: Secondary | ICD-10-CM

## 2018-02-01 DIAGNOSIS — I10 Essential (primary) hypertension: Secondary | ICD-10-CM | POA: Diagnosis not present

## 2018-02-01 DIAGNOSIS — F32A Depression, unspecified: Secondary | ICD-10-CM

## 2018-02-01 DIAGNOSIS — F419 Anxiety disorder, unspecified: Secondary | ICD-10-CM

## 2018-02-01 NOTE — Progress Notes (Signed)
Subjective:    Patient ID: Kevin Bass, male    DOB: November 28, 1940, 77 y.o.   MRN: [id number]  DOS:  02/01/2018 Type of visit - description : acute Concern about his blood pressure. He checks his BPs multiple times a day.  He is his observation that between noon and 4 PM his BPs are elevated, sometimes 170, 283 with diastolic of 80. It is also his observation that he takes Xanax and his BP decreases. He lost his wife in June and a sister shortly after, still dealing with his loss. Also he is concerned about his mitral valve.  BP Readings from Last 3 Encounters:  02/01/18 124/62  01/09/18 126/74  12/27/17 114/73   -  Review of Systems Denies suicidal ideas Admits to a good diet with low salt Not able to exercise much mostly due to heel pain.   Past Medical History:  Diagnosis Date  . Allergy   . Anxiety   . Arthritis    hands  . Atrial fibrillation ([place])   . Colon cancer [facility])    s/p partial colectomy 1990, Cscope neg 7-09, next 2014  . DISORDER, MITRAL VALVE    MV recontruction at [place] 2006----still needs ABX for SBE prophylaxis   . Elevated PSA    prostate nodule, [name]  . GERD (gastroesophageal reflux disease)   . Heart murmur    no problems since surgery  . HYPERGLYCEMIA, BORDERLINE 10/16/2008  . HYPERLIPIDEMIA 06/15/2006   diet controlled  . Hypertension   . HYPERTROPHY PROSTATE W/O UR OBST & OTH LUTS 10/16/2008  . Neuropathy   . Right groin pain    Dr. Redmond Pulling  . SCC (squamous cell carcinoma) 06/2017   s/p Mohs---[name]  . Stroke ([place])   . Wears partial dentures    lower partial    Past Surgical History:  Procedure Laterality Date  . COLECTOMY  1990  . HERNIA REPAIR Left 2014   [facility] rep w/mesh- March 2014  . LOOP RECORDER INSERTION N/A 12/01/2016   Procedure: LOOP RECORDER INSERTION;  Surgeon: Evans Lance, MD;  Location: [facility] CV LAB;  Service: Cardiovascular;  Laterality: N/A;  . MITRAL VALVE ANNULOPLASTY     [facility] 2006  . TEE  WITHOUT CARDIOVERSION N/A 10/05/2016   Procedure: TRANSESOPHAGEAL ECHOCARDIOGRAM (TEE);  Surgeon: [name], MD;  Location: [facility] ENDOSCOPY;  Service: Cardiovascular;  Laterality: N/A;    Social History   Socioeconomic History  . Marital status: Widowed    Spouse name: suzanne  . Number of children: 2  . Years of education: college  . Highest education level: Not on file  Occupational History  . Occupation: retired   Scientific laboratory technician  . Financial resource strain: Not on file  . Food insecurity:    Worry: Not on file    Inability: Not on file  . Transportation needs:    Medical: Not on file    Non-medical: Not on file  Tobacco Use  . Smoking status: Former Smoker    Packs/day: 0.50    Years: 20.00    Pack years: 10.00    Types: Cigarettes    Last attempt to quit: 02/23/1983    Years since quitting: 34.9  . Smokeless tobacco: Never Used  Substance and Sexual Activity  . Alcohol use: Yes    Alcohol/week: 7.0 standard drinks    Types: 7 Glasses of wine per week  . Drug use: No  . Sexual activity: Not Currently  Lifestyle  . Physical  activity:    Days per week: Not on file    Minutes per session: Not on file  . Stress: Not on file  Relationships  . Social connections:    Talks on phone: Not on file    Gets together: Not on file    Attends religious service: Not on file    Active member of club or organization: Not on file    Attends meetings of clubs or organizations: Not on file    Relationship status: Not on file  . Intimate partner violence:    Fear of current or ex partner: Not on file    Emotionally abused: Not on file    Physically abused: Not on file    Forced sexual activity: Not on file  Other Topics Concern  . Not on file  Social History Narrative   From [place], lost wife 08/2017 to cancer   daughter in [place], son in [place] as of 02/01/2018      Reactions   Statins Other (See Comments)   Myalgias (intolerance)      Medication List         Accurate as of February 01, 2018 11:59 PM. Always use your most recent med list.        ALPRAZolam 0.25 MG tablet Commonly known as:  XANAX TAKE ONE OR TWO TABLETS BY MOUTH TWICE DAILY AS NEEDED FOR ANXIETY   Arginine 500 MG Caps Take 1,500 mg by mouth daily.   ascorbic acid 500 MG/5ML syrup Commonly known as:  VITAMIN C Take 1,000 mg by mouth 2 (two) times daily.   ELIQUIS 5 MG Tabs tablet Generic drug:  apixaban TAKE ONE TABLET BY MOUTH TWICE DAILY   ezetimibe 10 MG tablet Commonly known as:  ZETIA Take 1 tablet (10 mg total) by mouth daily.   FISH OIL PO Take 1,000 mg by mouth daily.   gabapentin 300 MG capsule Commonly known as:  NEURONTIN Take 2 capsules (600mg ) in the Am and 3 capsules (900mg ) in the evening   JOINT HEALTH PO Take 1 tablet by mouth daily. JARROW JOINT BUILDER   loratadine 10 MG tablet Commonly known as:  CLARITIN Take 10 mg by mouth at bedtime.   losartan 25 MG tablet Commonly known as:  COZAAR Take 1 tablet (25 mg total) by mouth 2 (two) times daily.   LYCOPENE PO Take 1 capsule by mouth 2 (two) times daily. JARROW FORMULA LYCO-SORB PROSTATE SUPPORT   metoprolol succinate 25 MG 24 hr tablet Commonly known as:  TOPROL-XL Take 1.5 tablets (37.5 mg total) by mouth daily.   OCUVITE PRESERVISION PO Take 1 tablet by mouth 2 (two) times daily.   rosuvastatin 40 MG tablet Commonly known as:  CRESTOR Take 1 tablet (40 mg total) by mouth daily.           Objective:   Physical Exam BP 124/62 (BP Location: Left Arm, Patient Position: Sitting, Cuff Size: Small)   Pulse 61   Temp 98.2 F (36.8 C) (Oral)   Resp 16   Ht 5\' 10"  (1.778 m)   Wt 181 lb 6 oz (82.3 kg)   SpO2 91%   BMI 26.02 kg/m  General:   Well developed, NAD, BMI noted. HEENT:  Normocephalic . Face symmetric, atraumatic Lungs:  CTA B Normal respiratory effort, no intercostal retractions, no accessory muscle use. Heart: RRR,  no murmur.  No pretibial edema  bilaterally  Skin: Not pale. Not  jaundice Neurologic:  alert & oriented X3.  Speech normal, gait appropriate for age and unassisted Psych--  Cognition and judgment appear intact.  Cooperative with normal attention span and concentration.  Behavior appropriate. No anxious or depressed appearing.      Assessment & Plan:     Assessment Prediabetes HTN Hyperlipidemia, statin intolerant (simva-pravachol: shoulder pain) Anxiety- rarely takes xanax Neuro  --Facial neuropathy dx 2015, no w/u, sx resurface x1 after temporary d/c of meds  --saw neuro again 02/2017, labs (-), had a NCS, Rx gaba CV: --Mitral valve annuloplasty 2006-- needs SBE prophylaxis -- SOB 04-2016, seen at [facility], had a ECHO Nl fx MV, cath: no CAD --Cryptogenic stroke 09/12/2016, had slurred speech. 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 [name] Colon cancer partial colectomy 1990, colonoscopy 08-2007 negative, Cscope again 09-2012 normal, 5 years  SCC . MOHS 06/2017  PLAN:   HTN: Slightly elevated BP in the afternoons, I recommend to change   losartan from   25 mg BID to 2 tabs in AM;  he is somewhat reluctant.  Eventually we agreed to take 1 losartan in the morning and 1 at noon.  Continue metoprolol XL 37.5 mg daily.  He will call and let me know how that is working.  Consider increased doses of current meds. His last echo was 10-20 19, I told the patient his mitral valve looks good. Anxiety depression: Currently on Xanax, takes on average 1 a day, does not feels very sleepy or drowsy.  We talk about SSRIs, he is not interested at this point but will keep that in mind.  Counseled. Next appointment 04-2018

## 2018-02-01 NOTE — Progress Notes (Signed)
Carelink Summary Report / Loop Recorder 

## 2018-02-01 NOTE — Telephone Encounter (Signed)
Tried calling Pt at both numbers- no answer- LMOM on mobile informing to return call.

## 2018-02-01 NOTE — Telephone Encounter (Signed)
Copied from [place] [phone]. Topic: General - Other >> Feb 01, 2018  8:37 AM Ivar Drape wrote: Reason for CRM:    Patient had a BP check as night of 170/85 and he would like to be worked in with Dr. [name] today.  Please advise.

## 2018-02-01 NOTE — Patient Instructions (Signed)
Please let me know how your BP is doing in 3 to 4 weeks

## 2018-02-01 NOTE — Telephone Encounter (Signed)
Schedule for 3.30 pm today

## 2018-02-01 NOTE — Progress Notes (Signed)
Pre visit review using our clinic review tool, if applicable. No additional management support is needed unless otherwise documented below in the visit note. 

## 2018-02-01 NOTE — Telephone Encounter (Signed)
Pt returned call- will be here at 3:20.

## 2018-02-02 NOTE — Assessment & Plan Note (Signed)
HTN: Slightly elevated BP in the afternoons, I recommend to change   losartan from   25 mg BID to 2 tabs in AM;  he is somewhat reluctant.  Eventually we agreed to take 1 losartan in the morning and 1 at noon.  Continue metoprolol XL 37.5 mg daily.  He will call and let me know how that is working.  Consider increased doses of current meds. His last echo was 10-20 19, I told the patient his mitral valve looks good. Anxiety depression: Currently on Xanax, takes on average 1 a day, does not feels very sleepy or drowsy.  We talk about SSRIs, he is not interested at this point but will keep that in mind.  Counseled. Next appointment 04-2018

## 2018-02-03 ENCOUNTER — Other Ambulatory Visit: Payer: Self-pay | Admitting: Internal Medicine

## 2018-02-07 ENCOUNTER — Encounter: Payer: Self-pay | Admitting: Internal Medicine

## 2018-02-08 ENCOUNTER — Ambulatory Visit: Payer: Self-pay

## 2018-02-08 NOTE — Telephone Encounter (Signed)
Patient called and says that his BP has been still running high with the medication changes that Dr. [name] made. He says he takes Losartan 25 mg 2 tabs in the morning and Metoprolol 1.5 mg in the morning and his BP at 1550 was 155/78. He says he sent a message to Dr. [name] yesterday about his BP readings, but haven't heard back yet. I advised Dr. [name] responded today at 1257 the message below, he verbalized understanding and says that he was told to take Losartan 50 mg in the morning, because he was taking it in the morning and around 8 pm. He says he has an appointment on Friday, 02/10/18 to discuss and would like an appointment tomorrow, if one is available. I advised no openings tomorrow, but I placed him on the waiting list, he verbalized understanding. I advised to go ahead and take the 1/2 Metoprolol and check his BP/P later on this evening, follow Dr. [name] instructions below, check/record BP/P in the morning, mid afternoon and at night tomorrow, then bring readings to the office on Friday, he verbalized understanding. Advised to read the MyChart message.    MyChart Message from Dr. [name] to patient: Kevin Bass, so you are taking your medication according to plan: Losartan 25 mg twice a day Metoprolol 1.5 tablet daily. Your blood pressure still remains a slightly elevated in the afternoon. Please increase metoprolol 25 mg: 2 tablets in the morning. Watch your heart rate, you do not like it lower than 55 if we can avoid it. JP   Reason for Disposition . [name] has medication question only, adult not sick, and triager answers question  Protocols used: MEDICATION QUESTION CALL-A-AH

## 2018-02-08 NOTE — Telephone Encounter (Signed)
Will see him as planned

## 2018-02-10 ENCOUNTER — Encounter

## 2018-02-10 ENCOUNTER — Ambulatory Visit (INDEPENDENT_AMBULATORY_CARE_PROVIDER_SITE_OTHER): Payer: PPO | Admitting: Internal Medicine

## 2018-02-10 ENCOUNTER — Encounter: Payer: Self-pay | Admitting: Internal Medicine

## 2018-02-10 VITALS — BP 122/64 | HR 68 | Temp 98.0°F | Resp 16 | Ht 70.0 in | Wt 181.0 lb

## 2018-02-10 DIAGNOSIS — I10 Essential (primary) hypertension: Secondary | ICD-10-CM | POA: Diagnosis not present

## 2018-02-10 DIAGNOSIS — F419 Anxiety disorder, unspecified: Secondary | ICD-10-CM | POA: Diagnosis not present

## 2018-02-10 DIAGNOSIS — F329 Major depressive disorder, single episode, unspecified: Secondary | ICD-10-CM | POA: Diagnosis not present

## 2018-02-10 MED ORDER — ESCITALOPRAM OXALATE 10 MG PO TABS: 5.0000 mg | ORAL_TABLET | Freq: Every day | ORAL | 1 refills | Status: DC

## 2018-02-10 NOTE — Patient Instructions (Signed)
Daily medications as prescribed  Call with your blood pressure readings in 10 days to 2 weeks

## 2018-02-10 NOTE — Progress Notes (Signed)
Subjective:    Patient ID: Kevin Bass, male    DOB: 1940/05/21, 77 y.o.   MRN: [id number]  DOS:  02/10/2018 Type of visit - description : Acute visit Patient is here again concerned about his blood pressure BPs have been running as high as 161/83 to 137/81 He denies any symptoms such as headache, chest pain no difficulty breathing He still has some facial pain due to the neuralgia He reports very good diet with low salt but he admits he is not as active as in previous years. Heart rate is staying in the 70s and occasional 60s.   Review of Systems See above  Past Medical History:  Diagnosis Date  . Allergy   . Anxiety   . Arthritis    hands  . Atrial fibrillation ([place])   . Colon cancer [facility])    s/p partial colectomy 1990, Cscope neg 7-09, next 2014  . DISORDER, MITRAL VALVE    MV recontruction at [place] 2006----still needs ABX for SBE prophylaxis   . Elevated PSA    prostate nodule, [name]  . GERD (gastroesophageal reflux disease)   . Heart murmur    no problems since surgery  . HYPERGLYCEMIA, BORDERLINE 10/16/2008  . HYPERLIPIDEMIA 06/15/2006   diet controlled  . Hypertension   . HYPERTROPHY PROSTATE W/O UR OBST & OTH LUTS 10/16/2008  . Neuropathy   . Right groin pain    Dr. Redmond Pulling  . SCC (squamous cell carcinoma) 06/2017   s/p Mohs---[name]  . Stroke ([place])   . Wears partial dentures    lower partial    Past Surgical History:  Procedure Laterality Date  . COLECTOMY  1990  . HERNIA REPAIR Left 2014   [facility] rep w/mesh- March 2014  . LOOP RECORDER INSERTION N/A 12/01/2016   Procedure: LOOP RECORDER INSERTION;  Surgeon: Evans Lance, MD;  Location: [facility] CV LAB;  Service: Cardiovascular;  Laterality: N/A;  . MITRAL VALVE ANNULOPLASTY     [facility] 2006  . TEE WITHOUT CARDIOVERSION N/A 10/05/2016   Procedure: TRANSESOPHAGEAL ECHOCARDIOGRAM (TEE);  Surgeon: [name], MD;  Location: [facility] ENDOSCOPY;  Service: Cardiovascular;  Laterality: N/A;     Social History   Socioeconomic History  . Marital status: Widowed    Spouse name: suzanne  . Number of children: 2  . Years of education: college  . Highest education level: Not on file  Occupational History  . Occupation: retired   Scientific laboratory technician  . Financial resource strain: Not on file  . Food insecurity:    Worry: Not on file    Inability: Not on file  . Transportation needs:    Medical: Not on file    Non-medical: Not on file  Tobacco Use  . Smoking status: Former Smoker    Packs/day: 0.50    Years: 20.00    Pack years: 10.00    Types: Cigarettes    Last attempt to quit: 02/23/1983    Years since quitting: 34.9  . Smokeless tobacco: Never Used  Substance and Sexual Activity  . Alcohol use: Yes    Alcohol/week: 7.0 standard drinks    Types: 7 Glasses of wine per week  . Drug use: No  . Sexual activity: Not Currently  Lifestyle  . Physical activity:    Days per week: Not on file    Minutes per session: Not on file  . Stress: Not on file  Relationships  . Social connections:    Talks on phone: Not  on file    Gets together: Not on file    Attends religious service: Not on file    Active member of club or organization: Not on file    Attends meetings of clubs or organizations: Not on file    Relationship status: Not on file  . Intimate partner violence:    Fear of current or ex partner: Not on file    Emotionally abused: Not on file    Physically abused: Not on file    Forced sexual activity: Not on file  Other Topics Concern  . Not on file  Social History Narrative   From [place], lost wife 08/2017 to cancer   daughter in [place], son in [place] as of 02/10/2018      Reactions   Statins Other (See Comments)   Myalgias (intolerance)      Medication List       Accurate as of February 10, 2018 11:15 AM. Always use your most recent med list.        ALPRAZolam 0.25 MG tablet Commonly known as:  XANAX TAKE ONE OR TWO TABLETS BY MOUTH  TWICE DAILY AS NEEDED FOR ANXIETY   Arginine 500 MG Caps Take 1,500 mg by mouth daily.   ascorbic acid 500 MG/5ML syrup Commonly known as:  VITAMIN C Take 1,000 mg by mouth 2 (two) times daily.   ELIQUIS 5 MG Tabs tablet Generic drug:  apixaban TAKE ONE TABLET BY MOUTH TWICE DAILY   ezetimibe 10 MG tablet Commonly known as:  ZETIA Take 1 tablet (10 mg total) by mouth daily.   FISH OIL PO Take 1,000 mg by mouth daily.   gabapentin 300 MG capsule Commonly known as:  NEURONTIN Take 2 capsules (600mg ) in the Am and 3 capsules (900mg ) in the evening   JOINT HEALTH PO Take 1 tablet by mouth daily. JARROW JOINT BUILDER   loratadine 10 MG tablet Commonly known as:  CLARITIN Take 10 mg by mouth at bedtime.   losartan 25 MG tablet Commonly known as:  COZAAR Take 1 tablet (25 mg total) by mouth 2 (two) times daily.   LYCOPENE PO Take 1 capsule by mouth 2 (two) times daily. JARROW FORMULA LYCO-SORB PROSTATE SUPPORT   metoprolol succinate 25 MG 24 hr tablet Commonly known as:  TOPROL-XL Take 1.5 tablets (37.5 mg total) by mouth daily.   OCUVITE PRESERVISION PO Take 1 tablet by mouth 2 (two) times daily.   rosuvastatin 40 MG tablet Commonly known as:  CRESTOR Take 1 tablet (40 mg total) by mouth daily.           Objective:   Physical Exam BP 122/64 (BP Location: Left Arm, Patient Position: Sitting, Cuff Size: Small)   Pulse 68   Temp 98 F (36.7 C) (Oral)   Resp 16   Ht 5\' 10"  (1.778 m)   Wt 181 lb (82.1 kg)   SpO2 98%   BMI 25.97 kg/m  General:   Well developed, NAD, BMI noted. HEENT:  Normocephalic . Face symmetric, atraumatic Lungs:  CTA B Normal respiratory effort, no intercostal retractions, no accessory muscle use. Heart: RRR,  no murmur.  No pretibial edema bilaterally  Skin: Not pale. Not jaundice Neurologic:  alert & oriented X3.  Speech normal, gait appropriate for age and unassisted Psych--  Cognition and judgment appear intact.   Cooperative with normal attention span and concentration.  Behavior appropriate. No anxious or depressed appearing.  Assessment & Plan:     Assessment Prediabetes HTN Hyperlipidemia, statin intolerant (simva-pravachol: shoulder pain) Anxiety- rarely takes xanax Neuro  --Facial neuropathy dx 2015, no w/u, sx resurface x1 after temporary d/c of meds  --saw neuro again 02/2017, labs (-), had a NCS, Rx gaba CV: --Mitral valve annuloplasty 2006-- needs SBE prophylaxis -- SOB 04-2016, seen at [facility], had a ECHO Nl fx MV, cath: no CAD --Cryptogenic stroke 09/12/2016, had slurred speech. 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 [name] Colon cancer partial colectomy 1990, colonoscopy 08-2007 negative, Cscope again 09-2012 normal, 5 years  SCC . MOHS 06/2017  PLAN:   HTN: I agree that he needs a slightly better control.  Most of the high readings are usually at noon or in the afternoon.  It is his observation that BP decrease after he takes Xanax. Plan: Continue with losartan 2 tablets in the morning Was on metoprolol 1.5 tablet a day, he just started to take it 2 tablets in the morning, will increase it to 3 tablets in the morning. Continue monitoring BPs and let me know how things are going in 10 days to 2 weeks. Anxiety: He admits to being somewhat anxious and slightly depressed, he had a very difficult year, I offer him a daily medication, he is agreeable.  We will start Lexapro 5 mg daily.  Consider quickly increase it to 10 mg.

## 2018-02-10 NOTE — Progress Notes (Signed)
Pre visit review using our clinic review tool, if applicable. No additional management support is needed unless otherwise documented below in the visit note. 

## 2018-02-12 NOTE — Assessment & Plan Note (Signed)
  HTN: I agree that he needs a slightly better control.  Most of the high readings are usually at noon or in the afternoon.  It is his observation that BP decrease after he takes Xanax. Plan: Continue with losartan 2 tablets in the morning Was on metoprolol 1.5 tablet a day, he just started to take it 2 tablets in the morning, will increase it to 3 tablets in the morning. Continue monitoring BPs and let me know how things are going in 10 days to 2 weeks. Anxiety: He admits to being somewhat anxious and slightly depressed, he had a very difficult year, I offer him a daily medication, he is agreeable.  We will start Lexapro 5 mg daily.  Consider quickly increase it to 10 mg.

## 2018-02-16 DIAGNOSIS — H524 Presbyopia: Secondary | ICD-10-CM | POA: Diagnosis not present

## 2018-02-16 DIAGNOSIS — H35362 Drusen (degenerative) of macula, left eye: Secondary | ICD-10-CM | POA: Diagnosis not present

## 2018-02-16 DIAGNOSIS — I69398 Other sequelae of cerebral infarction: Secondary | ICD-10-CM | POA: Diagnosis not present

## 2018-02-16 DIAGNOSIS — H52223 Regular astigmatism, bilateral: Secondary | ICD-10-CM | POA: Diagnosis not present

## 2018-02-16 DIAGNOSIS — H53461 Homonymous bilateral field defects, right side: Secondary | ICD-10-CM | POA: Diagnosis not present

## 2018-02-16 DIAGNOSIS — H5203 Hypermetropia, bilateral: Secondary | ICD-10-CM | POA: Diagnosis not present

## 2018-02-16 DIAGNOSIS — H532 Diplopia: Secondary | ICD-10-CM | POA: Diagnosis not present

## 2018-02-16 DIAGNOSIS — H43812 Vitreous degeneration, left eye: Secondary | ICD-10-CM | POA: Diagnosis not present

## 2018-02-16 DIAGNOSIS — H43821 Vitreomacular adhesion, right eye: Secondary | ICD-10-CM | POA: Diagnosis not present

## 2018-02-16 DIAGNOSIS — H2513 Age-related nuclear cataract, bilateral: Secondary | ICD-10-CM | POA: Diagnosis not present

## 2018-02-25 ENCOUNTER — Encounter: Payer: Self-pay | Admitting: Internal Medicine

## 2018-02-26 ENCOUNTER — Encounter: Payer: Self-pay | Admitting: Internal Medicine

## 2018-02-27 ENCOUNTER — Encounter: Payer: Self-pay | Admitting: Internal Medicine

## 2018-02-27 ENCOUNTER — Ambulatory Visit (INDEPENDENT_AMBULATORY_CARE_PROVIDER_SITE_OTHER): Payer: PPO | Admitting: Internal Medicine

## 2018-02-27 VITALS — BP 128/92 | HR 67 | Temp 98.2°F | Resp 16 | Ht 70.0 in | Wt 182.1 lb

## 2018-02-27 DIAGNOSIS — I10 Essential (primary) hypertension: Secondary | ICD-10-CM

## 2018-02-27 DIAGNOSIS — F419 Anxiety disorder, unspecified: Secondary | ICD-10-CM | POA: Diagnosis not present

## 2018-02-27 DIAGNOSIS — F329 Major depressive disorder, single episode, unspecified: Secondary | ICD-10-CM

## 2018-02-27 MED ORDER — HYDROCHLOROTHIAZIDE 12.5 MG PO TABS: 12.5000 mg | ORAL_TABLET | Freq: Every day | ORAL | 3 refills | Status: DC

## 2018-02-27 MED ORDER — METOPROLOL SUCCINATE ER 25 MG PO TB24: 75.0000 mg | ORAL_TABLET | Freq: Every day | ORAL | 3 refills | Status: DC

## 2018-02-27 NOTE — Patient Instructions (Addendum)
Please come back in 2 weeks for a office visit.  Lexapro: increase to 1 tablet every day  Add a medication called hydrochlorothiazide 12.5 mg, take 1 in the morning.  This is a blood pressure medication.  Otherwise all the medications are the same.

## 2018-02-27 NOTE — Assessment & Plan Note (Signed)
HTN: Good compliance with losartan 2 tablets in the morning and metoprolol 50 tablets in the morning. Ambulatory BP readings continue to be elevated, BP today at the office is very good. Plan: Add HCTZ that he previously took successfully, only  12.5 mg q. a.m.  Titrate up if needed. Anxiety: Started Lexapro 10 mg half tablet daily, so far no benefit.  Increase to 1 tablet daily. RTC 2 weeks.

## 2018-02-27 NOTE — Progress Notes (Signed)
Subjective:    Patient ID: Kevin Bass, male    DOB: 1940/06/05, 78 y.o.   MRN: [id number]  DOS:  02/27/2018 Type of visit - description: acute See patient message: Lately has been feeling tired especially in the afternoon.  Occasionally light headache. BP yesterday was 162/80, 151/77. He reports good compliance with medication.  Also, he started Lexapro, has not noticed any difference  Review of Systems Denies chest pain, palpitations or lower extremity edema Has occasional tiredness in the morning and lightheadedness in the afternoon, no  new symptoms. When he feels lightheaded, he has checked his blood pressure and BPs have not been particularly low, at least one time it was 150/80  Past Medical History:  Diagnosis Date  . Allergy   . Anxiety   . Arthritis    hands  . Atrial fibrillation ([place])   . Colon cancer [facility])    s/p partial colectomy 1990, Cscope neg 7-09, next 2014  . DISORDER, MITRAL VALVE    MV recontruction at [place] 2006----still needs ABX for SBE prophylaxis   . Elevated PSA    prostate nodule, [name]  . GERD (gastroesophageal reflux disease)   . Heart murmur    no problems since surgery  . HYPERGLYCEMIA, BORDERLINE 10/16/2008  . HYPERLIPIDEMIA 06/15/2006   diet controlled  . Hypertension   . HYPERTROPHY PROSTATE W/O UR OBST & OTH LUTS 10/16/2008  . Neuropathy   . Right groin pain    Dr. Redmond Pulling  . SCC (squamous cell carcinoma) 06/2017   s/p Mohs---[name]  . Stroke ([place])   . Wears partial dentures    lower partial    Past Surgical History:  Procedure Laterality Date  . COLECTOMY  1990  . HERNIA REPAIR Left 2014   [facility] rep w/mesh- March 2014  . LOOP RECORDER INSERTION N/A 12/01/2016   Procedure: LOOP RECORDER INSERTION;  Surgeon: Evans Lance, MD;  Location: [facility];  Service: Cardiovascular;  Laterality: N/A;  . MITRAL VALVE ANNULOPLASTY     [facility] 2006  . TEE WITHOUT CARDIOVERSION N/A 10/05/2016   Procedure: TRANSESOPHAGEAL  ECHOCARDIOGRAM (TEE);  Surgeon: [name], MD;  Location: [facility] ENDOSCOPY;  Service: Cardiovascular;  Laterality: N/A;    Social History   Socioeconomic History  . Marital status: Widowed    Spouse name: suzanne  . Number of children: 2  . Years of education: college  . Highest education level: Not on file  Occupational History  . Occupation: retired   Scientific laboratory technician  . Financial resource strain: Not on file  . Food insecurity:    Worry: Not on file    Inability: Not on file  . Transportation needs:    Medical: Not on file    Non-medical: Not on file  Tobacco Use  . Smoking status: Former Smoker    Packs/day: 0.50    Years: 20.00    Pack years: 10.00    Types: Cigarettes    Last attempt to quit: 02/23/1983    Years since quitting: 35.0  . Smokeless tobacco: Never Used  Substance and Sexual Activity  . Alcohol use: Yes    Alcohol/week: 7.0 standard drinks    Types: 7 Glasses of wine per week  . Drug use: No  . Sexual activity: Not Currently  Lifestyle  . Physical activity:    Days per week: Not on file    Minutes per session: Not on file  . Stress: Not on file  Relationships  . Social  connections:    Talks on phone: Not on file    Gets together: Not on file    Attends religious service: Not on file    Active member of club or organization: Not on file    Attends meetings of clubs or organizations: Not on file    Relationship status: Not on file  . Intimate partner violence:    Fear of current or ex partner: Not on file    Emotionally abused: Not on file    Physically abused: Not on file    Forced sexual activity: Not on file  Other Topics Concern  . Not on file  Social History Narrative   From [place], lost wife 08/2017 to cancer   daughter in [place], son in [place] as of 02/27/2018      Reactions   Statins Other (See Comments)   Myalgias (intolerance)      Medication List       Accurate as of February 27, 2018  8:53 PM. Always use your  most recent med list.        ALPRAZolam 0.25 MG tablet Commonly known as:  XANAX TAKE ONE OR TWO TABLETS BY MOUTH TWICE DAILY AS NEEDED FOR ANXIETY   Arginine 500 MG Caps Take 1,500 mg by mouth daily.   ascorbic acid 500 MG/5ML syrup Commonly known as:  VITAMIN C Take 1,000 mg by mouth 2 (two) times daily.   ELIQUIS 5 MG Tabs tablet Generic drug:  apixaban TAKE ONE TABLET BY MOUTH TWICE DAILY   ezetimibe 10 MG tablet Commonly known as:  ZETIA Take 1 tablet (10 mg total) by mouth daily.   FISH OIL PO Take 1,000 mg by mouth daily.   gabapentin 300 MG capsule Commonly known as:  NEURONTIN Take 2 capsules (600mg ) in the Am and 3 capsules (900mg ) in the evening   hydrochlorothiazide 12.5 MG tablet Commonly known as:  HYDRODIURIL Take 1 tablet (12.5 mg total) by mouth daily.   JOINT HEALTH PO Take 1 tablet by mouth daily. JARROW JOINT BUILDER   LEXAPRO 10 MG tablet Generic drug:  escitalopram Take 1 tablet (10 mg total) by mouth daily.   loratadine 10 MG tablet Commonly known as:  CLARITIN Take 10 mg by mouth at bedtime.   losartan 25 MG tablet Commonly known as:  COZAAR Take by mouth daily. 2 tablets in the morning   LYCOPENE PO Take 1 capsule by mouth 2 (two) times daily. JARROW FORMULA LYCO-SORB PROSTATE SUPPORT   metoprolol succinate 25 MG 24 hr tablet Commonly known as:  TOPROL-XL Take 3 tablets (75 mg total) by mouth daily.   OCUVITE PRESERVISION PO Take 1 tablet by mouth 2 (two) times daily.   rosuvastatin 40 MG tablet Commonly known as:  CRESTOR Take 1 tablet (40 mg total) by mouth daily.           Objective:   Physical Exam BP (!) 128/92 (BP Location: Left Arm, Patient Position: Sitting, Cuff Size: Small)   Pulse 67   Temp 98.2 F (36.8 C) (Oral)   Resp 16   Ht 5\' 10"  (1.778 m)   Wt 182 lb 2 oz (82.6 kg)   SpO2 96%   BMI 26.13 kg/m  General:   Well developed, NAD, BMI noted. HEENT:  Normocephalic . Face symmetric,  atraumatic Lungs:  CTA B Normal respiratory effort, no intercostal retractions, no accessory muscle use. Heart: RRR,  no murmur.  No pretibial  edema bilaterally  Skin: Not pale. Not jaundice Neurologic:  alert & oriented X3.  Speech normal, gait appropriate for age and unassisted Psych--  Cognition and judgment appear intact.  Cooperative with normal attention span and concentration.  Behavior appropriate. No anxious or depressed appearing.      Assessment     Assessment Prediabetes HTN Hyperlipidemia, statin intolerant (simva-pravachol: shoulder pain) Anxiety- rarely takes xanax Neuro  --Facial neuropathy dx 2015, no w/u, sx resurface x1 after temporary d/c of meds  --saw neuro again 02/2017, labs (-), had a NCS, Rx gaba CV: --Mitral valve annuloplasty 2006-- needs SBE prophylaxis -- SOB 04-2016, seen at [facility], had a ECHO Nl fx MV, cath: no CAD --Cryptogenic stroke 09/12/2016, had slurred speech. 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 [name] Colon cancer partial colectomy 1990, colonoscopy 08-2007 negative, Cscope again 09-2012 normal, 5 years  SCC . MOHS 06/2017  PLAN:   HTN: Good compliance with losartan 2 tablets in the morning and metoprolol 50 tablets in the morning. Ambulatory BP readings continue to be elevated, BP today at the office is very good. Plan: Add HCTZ that he previously took successfully, only  12.5 mg q. a.m.  Titrate up if needed. Anxiety: Started Lexapro 10 mg half tablet daily, so far no benefit.  Increase to 1 tablet daily. RTC 2 weeks.

## 2018-02-27 NOTE — Progress Notes (Signed)
Pre visit review using our clinic review tool, if applicable. No additional management support is needed unless otherwise documented below in the visit note. 

## 2018-02-27 NOTE — Telephone Encounter (Signed)
Scheduled patient jan 14 @ 1130am.

## 2018-03-01 DIAGNOSIS — M25572 Pain in left ankle and joints of left foot: Secondary | ICD-10-CM | POA: Diagnosis not present

## 2018-03-01 DIAGNOSIS — M205X2 Other deformities of toe(s) (acquired), left foot: Secondary | ICD-10-CM | POA: Diagnosis not present

## 2018-03-01 DIAGNOSIS — M7752 Other enthesopathy of left foot: Secondary | ICD-10-CM | POA: Diagnosis not present

## 2018-03-01 DIAGNOSIS — M21962 Unspecified acquired deformity of left lower leg: Secondary | ICD-10-CM | POA: Diagnosis not present

## 2018-03-02 ENCOUNTER — Other Ambulatory Visit: Payer: Self-pay

## 2018-03-02 ENCOUNTER — Encounter: Payer: Self-pay | Admitting: Neurology

## 2018-03-02 ENCOUNTER — Ambulatory Visit: Payer: PPO | Admitting: Neurology

## 2018-03-02 VITALS — BP 112/70 | HR 63 | Ht 70.0 in | Wt 179.0 lb

## 2018-03-02 DIAGNOSIS — I639 Cerebral infarction, unspecified: Secondary | ICD-10-CM

## 2018-03-02 DIAGNOSIS — G629 Polyneuropathy, unspecified: Secondary | ICD-10-CM | POA: Diagnosis not present

## 2018-03-02 MED ORDER — GABAPENTIN 300 MG PO CAPS: ORAL_CAPSULE | ORAL | 6 refills | Status: DC

## 2018-03-02 NOTE — Patient Instructions (Signed)
1. Continue control of BP, cholesterol, sugar levels. Continue Eliquis daily 2. Continue gabapentin 3. Follow-up with eye doctor as scheduled 4. Consider seeing a counselor as well for depression 5. For any sudden changes in symptoms, go to ER immediately 6. Follow-up in 6 months, call for any changes

## 2018-03-02 NOTE — Progress Notes (Signed)
NEUROLOGY FOLLOW UP OFFICE NOTE  Kevin Bass [id number] 1940-12-25  HISTORY OF PRESENT ILLNESS: I had the pleasure of seeing Kevin Bass in follow-up in the neurology clinic on 03/02/2017. He is alone in the office today. He has had recurrent strokes, most recently last October 2019 with right arm weakness/numbness. MRI brain showed left hippocampal infarct with occlusion of left PCA. He was off Eliquis for a few days prior to the stroke. He is now back on Eliquis, he is also on a statin. His BP has been elevated recently, HCTZ was added however he stopped taking it after one dose due to significant dizziness. He denies any further right-sided weakness/numbness, the vision on his right side is better. He had right median nerve and ulnar nerve decompression in October 2019 and feels his right arm symptoms are better, his hand still gets cold but he denies any numbness and not much tingling. He still has issues with neuropathy in his right leg. He had self decreased gabapentin 358m, taking 1 tab in AM, 3 tabs in PM (helped with afternoon dizziness). He was started on Lexapro, dose increased recently to 112mdaily. He still feels a little down. Last week he started having pain in his right eye ball, no vision loss or pain with eye movements, he sees his ophthalmologist tomorrow.   HPI 10/01/2016:  This is a pleasant 89o RH man with a history of colon cancer, hyperlipidemia, previous mitral valve repair in his usual state of health until 09/11/16 after taking a walk, he saw his brother-in-law and when he started noticing word-finding difficulties. His wife reported that his speech was not necessarily slurred but he was having trouble finding his words. When symptoms were unchanged the next day, he decided to go to the ER. He was still noted to have mild word-finding difficulties. There was no focal numbness/tingling/weakness. He denied any headaches, dizziness, diplopia, dysarthria/dysphagia. I  personally reviewed MRI brain without contrast which showed an acute infarct in the left posterior insula and parietal operculum. MRA head and carotid dopplers did not show any significant stenosis. Echocardiogram showed EF 60-65%, mild LVH, prior MV repair without significant stenosis or regurgitation, normal LA size. He was switched from aspirin to Plavix. LDL was 124, HbA1c was 5.6. He was unable to get a TEE in the hospital, and has seen Dr. HiDebara Pickettnd agreed to having the procedure done on 8/14 followed by loop recorder. He denies any palpitations, chest pain, shortness of breath. No neck/back pain, bowel/bladder dysfunction. No falls. He denies any side effect on Plavix. He was started on Crestor yesterday. He feels he is 95% or more back to baseline, he denies any confusion or focal symptoms. He reports a history of facial pain on the right upper lip radiating up his cheek with good response to gabapentin. He takes 30057mhs with no side effects.  Update 12/07/16: He had no residual deficits from prior stroke, then on 11/29/16 he had sudden onset vertigo that made him fall backward. This lasted for 15 seconds and was followed by a mild headache. He called triage and was instructed to go seek medical attention the next day. He went to the ER and was found to have a right PICA territory stroke. He was evaluated by Neurology with no focal deficits seen. He reports he was taking aspirin and Plavix when the stroke occurred. He had an extensive workup for the prior stroke, he had a TEE in August 2018 which showed normal left atrium,  no thrombus seen. He had a 48-hour monitor which was unremarkable. In the hospital, he had a CTA of the head and neck which did not show any occlusion or stenosis. Repeat TTE showed EF of 55-60%, left atrium mildly dilated. He had a CT chest/abdomen/pelvis which did not show any evidence of malignancy. On hospital discharge, he was instructed to continue aspirin and Plavix and stop his  antihypertensives. He had a loop recorder placed.  Update 12/06/2017: He underwent right median nerve and ulnar nerve decompression on 11/28/17. He had stopped the Eliquis for 2 days for the procedure, and restarted Eliquis on 11/29/17. On 12/02/17, he took a nap and woke up with right arm weakness and numbness. He could not lift his right arm up. After 20 minutes, he started getting more feeling in his arm. Per records, he stood up and noticed his gait was off. The weakness improved after several minutes, but he continued to feel off balance. He went to the ER where SBP was elevated at 170. NIH 0 in the hospital. Records were reviewed, he had an MRI brain without contrast showing an acute infarct in the left hippocampus, occlusion of the left PCA which could be acute. There were chronic infarcts in the right cerebellum and left parietal operculum. He had an echo which showed an EF of 55-60%, left atrium normal, right atrium moderately dilated. No significant stenosis on carotid dopplers. LDL in August 2019 was 120, he was advised to increase Crestor to 73m daily. He was having muscle pains and stopped statin for 5 months, he was restarted Crestor with Zetia last August.  He was discharged home 2 days ago and reports that this is the worst of his strokes. It has affected his memory more than anything. He has also noticed vision changes on the right visual field, he describes blurred/double vision reading on his right side. He feels his strength is back but his balance is still off. He also has left foot pain. He has been dealing with his wife and sister's recent deaths.   Diagnostic Data: Neuropathy labs were normal (TSH, B12, ESR, folate, SPEP/IFE). He had an EMG/NCV of the right UE and LE which showed right ulnar neuropathy with slowing across the elbow, axon loss and demyelinating in type, mild to moderate in degree; right median neuropathy at or distal to the wrist, consistent with carpal tunnel, mild in  degree, and probable L5 radiculopathy, mild. No evidence of a large fiber sensorimotor polyneuropathy.    PAST MEDICAL HISTORY: Past Medical History:  Diagnosis Date  . Allergy   . Anxiety   . Arthritis    hands  . [place]   . [facility] ([facility]    s/p partial colectomy 1990, Cscope neg 7-09, next 2014  . DISORDER, MITRAL VALVE    [facility] [facility]still needs ABX for SBE prophylaxis   . Elevated PSA    prostate nodule, Dr BAlinda Money . GERD (gastroesophageal reflux disease)   . Heart murmur    no problems since surgery  . HYPERGLYCEMIA, BORDERLINE 10/16/2008  . HYPERLIPIDEMIA 06/15/2006   diet controlled  . Hypertension   . HYPERTROPHY PROSTATE W/O UR OBST & OTH LUTS 10/16/2008  . Neuropathy   . Right groin pain    Dr. WRedmond Pulling . SCC (squamous cell carcinoma) 06/2017   s/p Mohs---[place]ilana Na . [place]   . Wears partial dentures    lower partial    MEDICATIONS: Current Outpatient Medications on  File Prior to Visit  Medication Sig Dispense Refill  . ALPRAZolam (XANAX) 0.25 MG tablet TAKE ONE OR TWO TABLETS BY MOUTH TWICE DAILY AS NEEDED FOR ANXIETY  (Patient taking differently: Take 0.25 mg by mouth 2 (two) times daily as needed for anxiety. ) 40 tablet 1  . Arginine 500 MG CAPS Take 1,500 mg by mouth daily.    Marland Kitchen ascorbic acid (VITAMIN C) 500 MG/5ML syrup Take 1,000 mg by mouth 2 (two) times daily.    Marland Kitchen ELIQUIS 5 MG TABS tablet TAKE ONE TABLET BY MOUTH TWICE DAILY  60 tablet 6  . escitalopram (LEXAPRO) 10 MG tablet Take 1 tablet (10 mg total) by mouth daily. 30 tablet 3  . ezetimibe (ZETIA) 10 MG tablet Take 1 tablet (10 mg total) by mouth daily. 90 tablet 1  . gabapentin (NEURONTIN) 300 MG capsule Take 2 capsules (698m) in the Am and 3 capsules (9085m in the evening 150 capsule 6  . Glucosamine-MSM-Hyaluronic Acd (JOINT HEALTH PO) Take 1 tablet by mouth daily. [place]  . hydrochlorothiazide (HYDRODIURIL) 12.5 MG tablet Take 1  tablet (12.5 mg total) by mouth daily. 30 tablet 3  . loratadine (CLARITIN) 10 MG tablet Take 10 mg by mouth at bedtime.     . Marland Kitchenosartan (COZAAR) 25 MG tablet Take by mouth daily. 2 tablets in the morning    . LYCOPENE PO Take 1 capsule by mouth 2 (two) times daily. JARROW FORMULA LYCO-SORB PROSTATE SUPPORT     . metoprolol succinate (TOPROL-XL) 25 MG 24 hr tablet Take 3 tablets (75 mg total) by mouth daily. 90 tablet 3  . Multiple Vitamins-Minerals (OCUVITE PRESERVISION PO) Take 1 tablet by mouth 2 (two) times daily.     . Omega-3 Fatty Acids (FISH OIL PO) Take 1,000 mg by mouth daily.     . rosuvastatin (CRESTOR) 40 MG tablet Take 1 tablet (40 mg total) by mouth daily. 90 tablet 1   No current facility-administered medications on file prior to visit.     ALLERGIES: Allergies  Allergen Reactions  . Statins Other (See Comments)    Myalgias (intolerance)    FAMILY HISTORY: Family History  Problem Relation Age of Onset  . Colon cancer Other        M side (cousins)  . Diabetes Neg Hx   . Stroke Neg Hx   . CAD Neg Hx   . Prostate cancer Neg Hx   . Rectal cancer Neg Hx   . Esophageal cancer Neg Hx     SOCIAL HISTORY: Social History   Socioeconomic History  . Marital status: Widowed    Spouse name: suzanne  . Number of children: 2  . Years of education: college  . Highest education level: Not on file  Occupational History  . Occupation: retired   [employer] laboratory technician. Financial resource strain: Not on file  . Food insecurity:    Worry: Not on file    Inability: Not on file  . Transportation needs:    Medical: Not on file    Non-medical: Not on file  Tobacco Use  . Smoking status: Former Smoker    Packs/day: 0.50    Years: 20.00    Pack years: 10.00    Types: Cigarettes    Last attempt to quit: 02/23/1983    Years since quitting: 35.0  . Smokeless tobacco: Never Used  Substance and Sexual Activity  . Alcohol use: Yes    Alcohol/week: 7.0 standard drinks  Types: 7  Glasses of wine per week  . Drug use: No  . Sexual activity: Not Currently  Lifestyle  . Physical activity:    Days per week: Not on file    Minutes per session: Not on file  . Stress: Not on file  Relationships  . Social connections:    Talks on phone: Not on file    Gets together: Not on file    Attends religious service: Not on file    Active member of club or organization: Not on file    Attends meetings of clubs or organizations: Not on file    Relationship status: Not on file  . Intimate partner violence:    Fear of current or ex partner: Not on file    Emotionally abused: Not on file    Physically abused: Not on file    Forced sexual activity: Not on file  Other Topics Concern  . Not on file  Social History Narrative   From [place], lost wife 08/2017 to cancer   daughter in [place], son in [place]: Constitutional: No fevers, chills, or sweats, no generalized fatigue, change in appetite Eyes: No visual changes, double vision, eye pain Ear, nose and throat: No hearing loss, ear pain, nasal congestion, sore throat Cardiovascular: No chest pain, palpitations Respiratory:  No shortness of breath at rest or with exertion, wheezes GastrointestinaI: No nausea, vomiting, diarrhea, abdominal pain, fecal incontinence Genitourinary:  No dysuria, urinary retention or frequency Musculoskeletal:  + neck pain, back pain Integumentary: No rash, pruritus, skin lesions Neurological: as above Psychiatric: + depression, no insomnia, anxiety Endocrine: No palpitations, fatigue, diaphoresis, mood swings, change in appetite, change in weight, increased thirst Hematologic/Lymphatic:  No anemia, purpura, petechiae. Allergic/Immunologic: no itchy/runny eyes, nasal congestion, recent allergic reactions, rashes  PHYSICAL EXAM: Vitals:   03/02/18 1132  BP: 112/70  Pulse: 63  SpO2: 95%   General: No acute distress, in better spirits today  Head:   Normocephalic/atraumatic Neck: supple, no paraspinal tenderness, full range of motion Heart:  Regular rate and rhythm Lungs:  Clear to auscultation bilaterally Back: No paraspinal tenderness Skin/Extremities: No rash, no edema Neurological Exam: alert and oriented to person, place, and time. No aphasia or dysarthria. Fund of knowledge is appropriate.  Recent and remote memory intact. Attention and concentration are normal. Able to name objects and repeat phrases. Cranial nerves: Pupils equal, round, reactive to light.  Extraocular movements intact with no nystagmus. Visual fields intact (improved from prior). Facial sensation intact. No facial asymmetry. Tongue, uvula, palate midline.  Motor: Bulk and tone normal, muscle strength 5/5 throughout, no pronator drift.  Sensation: intact to light touch. Deep tendon reflexes +1 right UE, +2 left UE and bilateral patellar reflexes, absent ankle jerks bilaterally (similar to prior).Finger to nose testing intact.  Gait narrow-based and steady, able to tandem walk adequately.  Romberg negative.  IMPRESSION: This is a pleasant 78 yo RH man with a history of colon cancer, hyperlipidemia, previous mitral valve repair with recurrent strokes secondary to atrial fibrillation, most recently last October 2019 with a left PCA stroke affecting the left hippocampus. He had been off Eliquis for 2 days for median and ulnar nerve decompression, and had restarted it 3 days before the stroke. Stroke felt to be due to atrial fibrillation. He has had 2 prior strokes with left MCA branch infarct last July 2018 with transient mild aphasia, then a right PICA territory infarct with transient vertigo  last 11/29/16. Loop recorder captured atrial fibrillation. The pain and paresthesias in his arm are better post-surgery, he has cut down on gabapentin to 356m in AM, 9061min PM. We again discussed continuation of Eliquis and control of vascular risk factors, continue working on HTN with Dr.  PaLarose KellsHe will be seeing Ophtho tomorrow for the right eye pain. We discussed consideration for seeing a counselor for his depression. He knows to go to the ER for any sudden change in symptoms. Follow-up in 6 months, he knows to call for any changes.   Thank you for allowing me to participate in his care.  Please do not hesitate to call for any questions or concerns.  The duration of this appointment visit was 30 minutes of face-to-face time with the patient.  Greater than 50% of this time was spent in counseling, explanation of diagnosis, planning of further management, and coordination of care.   KaEllouise NewerM.D.   CC: Dr. PaLarose Kells

## 2018-03-03 ENCOUNTER — Encounter: Payer: Self-pay | Admitting: Neurology

## 2018-03-03 DIAGNOSIS — H5711 Ocular pain, right eye: Secondary | ICD-10-CM | POA: Diagnosis not present

## 2018-03-03 DIAGNOSIS — Z8679 Personal history of other diseases of the circulatory system: Secondary | ICD-10-CM | POA: Diagnosis not present

## 2018-03-06 ENCOUNTER — Telehealth: Payer: Self-pay | Admitting: Internal Medicine

## 2018-03-06 MED ORDER — EZETIMIBE 10 MG PO TABS: 10.0000 mg | ORAL_TABLET | Freq: Every day | ORAL | 2 refills | Status: DC

## 2018-03-06 MED ORDER — ALPRAZOLAM 0.25 MG PO TABS: ORAL_TABLET | ORAL | 1 refills | Status: DC

## 2018-03-06 NOTE — Telephone Encounter (Signed)
Copied from [place] [id number]. Topic: Quick Communication - Rx Refill/Question >> Mar 06, 2018 12:59 PM Burchel, Abbi R wrote: Medication: ALPRAZolam (XANAX) 0.25 MG tablet, ezetimibe (ZETIA) 10 MG tablet   Preferred Pharmacy: [facility] [phone] [address], [place]  [phone] (Phone) [phone] (Fax)    Pt was advised that RX refills may take up to 3 business days. We ask that you follow-up with your pharmacy.

## 2018-03-06 NOTE — Telephone Encounter (Signed)
Requested medication (s) are due for refill today: Yes  Requested medication (s) are on the active medication list: Yes  Last refill:  08/29/17  Future visit scheduled: Yes  Notes to clinic:  Unable to refill, cannot be delegated.     Requested Prescriptions  Pending Prescriptions Disp Refills   ALPRAZolam (XANAX) 0.25 MG tablet 40 tablet 1     Not Delegated - Psychiatry:  Anxiolytics/Hypnotics Failed - 03/06/2018  1:22 PM      Failed - This refill cannot be delegated      Failed - Urine Drug Screen completed in last 360 days.      Passed - Valid encounter within last 6 months    Recent Outpatient Visits          1 week ago Anxiety and depression   Archivist at [place], MD   3 weeks ago Essential hypertension   Archivist at [place], MD   1 month ago Essential hypertension   Archivist at [place], MD   1 month ago Hyperlipidemia, unspecified hyperlipidemia type   [facility]ivist at [place], MD   2 months ago Essential hypertension   Archivist at [place], Bonnita Levan, MD      Future Appointments            Tomorrow [place], Nadean Corwin, MD [place], [place]   In 2 months Colon Branch, MD [place] at [employer], [place]         Signed Prescriptions Disp Refills   ezetimibe (ZETIA) 10 MG tablet 90 tablet 2    Sig: Take 1 tablet (10 mg total) by mouth daily.     Cardiovascular:  Antilipid - Sterol Transport Inhibitors Passed - 03/06/2018  1:22 PM      Passed - Total Cholesterol in normal range and within 360 days    Cholesterol  Date Value Ref Range Status  01/09/2018 94 0 - 200 mg/dL Final    Comment:    ATP III Classification       Desirable:  < 200 mg/dL               Borderline High:  200 - 239 mg/dL          High:  > = 240  mg/dL         Passed - LDL in normal range and within 360 days    LDL Cholesterol  Date Value Ref Range Status  01/09/2018 29 0 - 99 mg/dL Final         Passed - HDL in normal range and within 360 days    HDL  Date Value Ref Range Status  01/09/2018 41.30 >39.00 mg/dL Final         Passed - Triglycerides in normal range and within 360 days    Triglycerides  Date Value Ref Range Status  01/09/2018 116.0 0.0 - 149.0 mg/dL Final    Comment:    Normal:  <150 mg/dLBorderline High:  150 - 199 mg/dL         Passed - Valid encounter within last 12 months    Recent Outpatient Visits          1 week ago Anxiety and depression   [facility]ivist at [employer] [facility],  Alda Berthold, MD   3 weeks ago Essential hypertension   Archivist at [place], MD   1 month ago Essential hypertension   Archivist at [place], MD   1 month ago Hyperlipidemia, unspecified hyperlipidemia type   Archivist at [place], MD   2 months ago Essential hypertension   [facility]ivist at [place], Bonnita Levan, MD      Future Appointments            Tomorrow [place], Nadean Corwin, MD [place], [place]   In 2 months Colon Branch, MD [employer] at [place]

## 2018-03-06 NOTE — Telephone Encounter (Signed)
Copied from [place] [id number]. Topic: Quick Communication - Rx Refill/Question >> Mar 06, 2018  1:11 PM Burchel, Abbi R wrote: Medication: metoprolol succinate (TOPROL-XL) 25 MG 24 hr tablet  Preferred Pharmacy: [facility] [phone] - [place], [place]  [phone] (Phone) [phone] (Fax)  Pt states he has 10 days left of this medication bc rx was changed and he will run out before rx refill is due.  Please advise.   Agent: Please be advised that RX refills may take up to 3 business days. We ask that you follow-up with your pharmacy.

## 2018-03-06 NOTE — Telephone Encounter (Signed)
Pt is requesting refill on alprazolam.   Last OV: 02/27/2018 Last Fill: 08/29/2017 #40 and 1RF UDS: 01/09/2018 Low risk  NCCR printed- no discrepancies noted- sent for scanning

## 2018-03-06 NOTE — Telephone Encounter (Signed)
Sent!

## 2018-03-07 ENCOUNTER — Ambulatory Visit (INDEPENDENT_AMBULATORY_CARE_PROVIDER_SITE_OTHER): Payer: PPO | Admitting: Internal Medicine

## 2018-03-07 ENCOUNTER — Encounter: Payer: Self-pay | Admitting: Internal Medicine

## 2018-03-07 VITALS — BP 130/68 | HR 69 | Ht 70.0 in | Wt 181.0 lb

## 2018-03-07 DIAGNOSIS — R0989 Other specified symptoms and signs involving the circulatory and respiratory systems: Secondary | ICD-10-CM | POA: Diagnosis not present

## 2018-03-07 DIAGNOSIS — I480 Paroxysmal atrial fibrillation: Secondary | ICD-10-CM

## 2018-03-07 DIAGNOSIS — I639 Cerebral infarction, unspecified: Secondary | ICD-10-CM

## 2018-03-07 NOTE — Patient Instructions (Signed)
Medication Instructions:  Take losartan 25mg  twice daily Take metoprolol succinate 50mg  in the morning and 25mg  in the evening Remain OFF hydrochlorothiazide If you need a refill on your cardiac medications before your next appointment, please call your pharmacy.   Follow-Up: At [facility], you and your health needs are our priority.  As part of our continuing mission to provide you with exceptional heart care, we have created designated Provider Care Teams.  These Care Teams include your primary Cardiologist (physician) and Advanced Practice Providers (APPs -  Physician Assistants and Nurse Practitioners) who all work together to provide you with the care you need, when you need it. You will need a follow up appointment in 3 months.  You may see Dr. Debara Pickett or one of the following Advanced Practice Providers on your designated Care Team: Almyra Deforest, [place] . Fabian Sharp, PA-C  Any Other Special Instructions Will Be Listed Below (If Applicable). Dr. Debara Pickett recommends that you purchase a new blood pressure cuff. Omron is a good brand.  HOW TO TAKE YOUR BLOOD PRESSURE:  Rest 5 minutes before taking your blood pressure.  Don't smoke or drink caffeinated beverages for at least 30 minutes before.  Take your blood pressure before (not after) you eat.  Sit comfortably with your back supported and both feet on the floor (don't cross your legs).  Elevate your arm to heart level on a table or a desk.  Use the proper sized cuff. It should fit smoothly and snugly around your bare upper arm. There should be enough room to slip a fingertip under the cuff. The bottom edge of the cuff should be 1 inch above the crease of the elbow.  Ideally, take 3 measurements at one sitting and record the average.

## 2018-03-07 NOTE — Progress Notes (Signed)
OFFICE CONSULT NOTE  Chief Complaint:  Labile blood pressure  Primary Care Physician: Colon Branch, MD  HPI:  Kevin Bass is a 78 y.o. male who is being seen today for the evaluation of workup of cryptogenic stroke at the request of Colon Branch, MD. Kevin Bass is a pleasant 78 year old patient who recently had an unfortunate cryptogenic stroke. His past ocular history significant for mitral valve disease status post repair at [facility]. He is followed by cardiologist there and recently was admitted to [facility] with cryptogenic stroke. As per routine care or transesophageal echocardiogram as well as a implanted loop recorder were recommended. Unfortunately there was difficulty during the transesophageal echocardiogram procedure which involved technical difficulties with equipment and it was not able to be completed. There was some concern that the probe could not be passed adequately and he underwent upper esophageal imaging which was normal. He has had prior teas without any difficulty. After that he decided against a loop recorder and wanted to get another opinion from his cardiologist at [facility]. There was a visit there which did not indicate that a transesophageal echocardiogram would be helpful. I do not see any evidence on his prior echocardiograms of a bubble study to exclude PFO. This could be a significant finding as there is evidence now that closure of a small PFO in the setting of a patient whose had a cryptogenic stroke is guideline indicated. It's also possible that he could have atrial fibrillation, especially given his history of mitral valve disease and valve repair which is a significant risk factor for arrhythmias.  12/21/2016  Kevin Bass was seen today in follow-up.  He underwent a repeat echocardiogram which did not demonstrate any LV thrombus or evidence for atrial septal defect.  Subsequently he had placement of an implanted loop recorder by Dr. Lovena Le which was  seen in follow-up and appears to be well-healed.  There is no evidence today of any significant arrhythmias such as atrial fibrillation.  He is currently on aspirin and Plavix for stroke prevention.  He is also had increasing doses of medication to control hypertension.  Currently his blood pressure is well controlled at 108/70.  He is appropriately on rosuvastatin, ezetimibe and fish oil with an LDL-C is 60 approximately 3 weeks ago.  06/17/2017  Kevin Bass returns today for follow-up.  Overall he says he feels fairly well.  He denies any significant palpitations or recurrent atrial fibrillation.  EKG today shows normal sinus rhythm at 62.  He has been working with Dr. [name] for neuropathy.  Is currently on very high dose of gabapentin (a total of 1500 mg daily).  He is tolerating apixaban without any bleeding difficulty and cholesterol has been well controlled on rosuvastatin and ezetimibe.  Only, today he is complaining of some lower extremity discomfort and minimal edema.  He says this is worse on long car rides.  He does have notable bilateral lower extremity varicosities, and would likely benefit from compression stockings.  07/11/2017  Kevin Bass was seen today in follow-up.  I last saw him about a month ago.  He was doing well without any symptomatic A. fib.  His A. fib was detected by implanted loop recorder.  He has been on anticoagulation.  Most recently he was in the emergency department for symptomatic atrial fibrillation with heart rate as high as 200 when he was seen by EMS.  He was treated and seen in the emergency department.  He was started on metoprolol and  converted back to sinus rhythm.  He had questions about the value of the implanted loop recorder, however I reminded him that his job was not to notify you of arrhythmias as they were happening.    03/07/2018  Kevin Bass returns today for follow-up.  Recently he has been reported labile blood pressure.  He notes that he is  having spikes in his blood pressure mostly in the afternoons at times up to 170 although has some lower blood pressures in the morning.  I personally reviewed his blood pressure cuff (which incidentally is close to 80 years old) and straining some lability in his blood pressures.  Currently he is taking 75 mg of metoprolol in the morning along with 50 mg of losartan and was also prescribed to take an additional hydrochlorothiazide.  He said the hydrochlorothiazide was making him dizzy and therefore he has stopped the medication.  Of note he did have a recurrent TIA affecting the right PICA territory.  There was some associated transient vertigo.  This apparently happened after holding his Eliquis for 2 days due to a scheduled median and ulnar nerve decompression.  It was felt that A. fib was the cause of this however it seems fairly unlikely that he is developed thrombus during that period of time to cause stroke.  I do believe there is likely another explanation for this cause.  I do not want him to feel that he could never come off of blood thinners because of the risk for stroke.  PMHx:  Past Medical History:  Diagnosis Date  . Allergy   . Anxiety   . Arthritis    hands  . Atrial fibrillation ([place])   . Colon cancer [facility])    s/p partial colectomy 1990, Cscope neg 7-09, next 2014  . DISORDER, MITRAL VALVE    MV recontruction at [place] 2006----still needs ABX for SBE prophylaxis   . Elevated PSA    prostate nodule, [name]  . GERD (gastroesophageal reflux disease)   . Heart murmur    no problems since surgery  . HYPERGLYCEMIA, BORDERLINE 10/16/2008  . HYPERLIPIDEMIA 06/15/2006   diet controlled  . Hypertension   . HYPERTROPHY PROSTATE W/O UR OBST & OTH LUTS 10/16/2008  . Neuropathy   . Right groin pain    Dr. Redmond Pulling  . SCC (squamous cell carcinoma) 06/2017   s/p Mohs---[name]  . Stroke ([place])   . Wears partial dentures    lower partial    Past Surgical History:  Procedure Laterality  Date  . COLECTOMY  1990  . HERNIA REPAIR Left 2014   [facility] rep w/mesh- March 2014  . LOOP RECORDER INSERTION N/A 12/01/2016   Procedure: LOOP RECORDER INSERTION;  Surgeon: Evans Lance, MD;  Location: [facility];  Service: Cardiovascular;  Laterality: N/A;  . MITRAL VALVE ANNULOPLASTY     [facility] 2006  . TEE WITHOUT CARDIOVERSION N/A 10/05/2016   Procedure: TRANSESOPHAGEAL ECHOCARDIOGRAM (TEE);  Surgeon: [name], MD;  Location: [facility] ENDOSCOPY;  Service: Cardiovascular;  Laterality: N/A;    FAMHx:  Family History  Problem Relation Age of Onset  . Colon cancer Other        M side (cousins)  . Diabetes Neg Hx   . Stroke Neg Hx   . CAD Neg Hx   . Prostate cancer Neg Hx   . Rectal cancer Neg Hx   . Esophageal cancer Neg Hx     SOCHx:   reports that he quit  smoking about 35 years ago. His smoking use included cigarettes. He has a 10.00 pack-year smoking history. He has never used smokeless tobacco. He reports current alcohol use of about 7.0 standard drinks of alcohol per week. He reports that he does not use drugs.  ALLERGIES:  Allergies  Allergen Reactions  . Statins Other (See Comments)    Myalgias (intolerance)    ROS: Pertinent items noted in HPI and remainder of comprehensive ROS otherwise negative.  HOME MEDS: Current Outpatient Medications on File Prior to Visit  Medication Sig Dispense Refill  . ALPRAZolam (XANAX) 0.25 MG tablet TAKE ONE OR TWO TABLETS BY MOUTH TWICE DAILY AS NEEDED FOR ANXIETY 40 tablet 1  . Arginine 500 MG CAPS Take 1,500 mg by mouth daily.    Marland Kitchen ascorbic acid (VITAMIN C) 500 MG/5ML syrup Take 1,000 mg by mouth 2 (two) times daily.    Marland Kitchen ELIQUIS 5 MG TABS tablet TAKE ONE TABLET BY MOUTH TWICE DAILY  60 tablet 6  . escitalopram (LEXAPRO) 10 MG tablet Take 1 tablet (10 mg total) by mouth daily. 30 tablet 3  . ezetimibe (ZETIA) 10 MG tablet Take 1 tablet (10 mg total) by mouth daily. 90 tablet 2  . gabapentin (NEURONTIN) 300 MG capsule Take 2  capsules (600mg ) in the Am and 3 capsules (900mg ) in the evening 150 capsule 6  . Glucosamine-MSM-Hyaluronic Acd (JOINT HEALTH PO) Take 1 tablet by mouth daily. [place]    . hydrochlorothiazide (HYDRODIURIL) 12.5 MG tablet Take 1 tablet (12.5 mg total) by mouth daily. 30 tablet 3  . loratadine (CLARITIN) 10 MG tablet Take 10 mg by mouth at bedtime.     Marland Kitchen losartan (COZAAR) 25 MG tablet Take by mouth daily. 2 tablets in the morning    . LYCOPENE PO Take 1 capsule by mouth 2 (two) times daily. JARROW FORMULA LYCO-SORB PROSTATE SUPPORT     . metoprolol succinate (TOPROL-XL) 25 MG 24 hr tablet Take 3 tablets (75 mg total) by mouth daily. 90 tablet 3  . Multiple Vitamins-Minerals (OCUVITE PRESERVISION PO) Take 1 tablet by mouth 2 (two) times daily.     . Omega-3 Fatty Acids (FISH OIL PO) Take 1,000 mg by mouth daily.     . rosuvastatin (CRESTOR) 40 MG tablet Take 1 tablet (40 mg total) by mouth daily. 90 tablet 1   No current facility-administered medications on file prior to visit.     LABS/IMAGING: No results found for this or any previous visit (from the past 48 hour(s)). No results found.  LIPID PANEL:    Component Value Date/Time   CHOL 94 01/09/2018 1058   TRIG 116.0 01/09/2018 1058   HDL 41.30 01/09/2018 1058   CHOLHDL 2 01/09/2018 1058   VLDL 23.2 01/09/2018 1058   LDLCALC 29 01/09/2018 1058   LDLDIRECT 149.1 08/30/2012 1352    WEIGHTS: Wt Readings from Last 3 Encounters:  03/07/18 181 lb (82.1 kg)  03/02/18 179 lb (81.2 kg)  02/27/18 182 lb 2 oz (82.6 kg)    VITALS: Ht 5\' 10"  (1.778 m)   Wt 181 lb (82.1 kg)   BMI 25.97 kg/m   EXAM: General appearance: alert and no distress Neck: no carotid bruit, no JVD and thyroid not enlarged, symmetric, no tenderness/mass/nodules Lungs: clear to auscultation bilaterally Heart: regular rate and rhythm, S1, S2 normal, no murmur, click, rub or gallop Abdomen: soft, non-tender; bowel sounds normal; no masses,  no  organomegaly Extremities: extremities normal, atraumatic, no cyanosis or edema and  varicose veins noted Pulses: 2+ and symmetric Skin: Skin color, texture, turgor normal. No rashes or lesions Neurologic: Grossly normal Psych: Pleasant  EKG: Sinus rhythm at 69-personally reviewed  ASSESSMENT: 1. Labile blood pressure 2. Recurrent stroke 3. Status post mitral valve repair 4. A-fib - S/p ILR 5. CHADSVASC Score of 4 - on Eliquis 6. Essential hypertension 7. Dyslipidemia  PLAN: 1.   Kevin Bass unfortunately has recurrent stroke/TIA.  It was initially thought this might be due to A. fib, and certainly his initial episode may have, but is not clear that his second episode while off of Eliquis for only 2 days would have put him at significant risk to develop a thrombotic mediated stroke.  If he has been anticoagulated and did not have any recent A. fib, the stroke risk during that 48 hours would be very low and I suspect there are other reasons why he may presented with TIA or transient vertigo.  Nonetheless, he is now experiencing some labile blood pressures.  He has stopped taking hydrochlorothiazide due to dizziness.  I suspect we might be overtreating his blood pressures although he has had some higher readings at home.  I asked him to obtain a new blood pressure cuff and keep track of his blood pressures at home.  I will also adjust his medications moving 1 of the losartan's and metoprolol tablets to the evening allow better coverage throughout the day.  Follow-up with me in 3 months - he is also to see his PCP regarding this in a few weeks.  [name], MD, [facility], [place] Director of the Advanced Lipid Disorders &  Cardiovascular Risk Reduction Clinic Diplomate of the American Board of Clinical Lipidology Attending Cardiologist  Direct Dial: [phone]  Fax: [phone]  Website:  [email or web address]  03/07/2018, 11:34 AM

## 2018-03-10 ENCOUNTER — Other Ambulatory Visit: Payer: Self-pay | Admitting: *Deleted

## 2018-03-10 MED ORDER — METOPROLOL SUCCINATE ER 25 MG PO TB24: 75.0000 mg | ORAL_TABLET | Freq: Every day | ORAL | 3 refills | Status: DC

## 2018-03-10 NOTE — Telephone Encounter (Signed)
Call to pharmacy- they have note received the Rx- they requested it be re-sent. Rx re-sent per request

## 2018-03-10 NOTE — Telephone Encounter (Signed)
Refill sent in to pharmacy 

## 2018-03-10 NOTE — Telephone Encounter (Signed)
Pt called to check status of refill requested 03/06/2018. He states that Dr. [name] increased him to metoprolol 3/day (as reflected in medication list). Pt has only a couple days left. Please advise.

## 2018-03-10 NOTE — Telephone Encounter (Signed)
Spoke with pt who stated he called the pharmacy about his refill and was told he could not have it refilled until April 2020. Agent called pharmacy and was told that they need a new rx documenting the increase on his metoprolol because the rx they received today did not show the change. Please advise and reach out to pt.

## 2018-03-12 LAB — CUP PACEART REMOTE DEVICE CHECK
Implantable Pulse Generator Implant Date: 20181010
MDC IDC SESS DTM: 20191208123650

## 2018-03-14 NOTE — Telephone Encounter (Addendum)
Pt states that [facility] is stating to him it is to early to refill. He needs someone to call the insurance for him to explain to them that Dr [name] changed his dose so that he can refill it.  Please Advise & let patient know the status. [employer].  ID :: [id number] Number is [id number]

## 2018-03-14 NOTE — Telephone Encounter (Signed)
Rx was sent [facility] to [facility] with dosage/sig change.

## 2018-03-15 ENCOUNTER — Ambulatory Visit: Payer: PPO | Admitting: Internal Medicine

## 2018-03-17 ENCOUNTER — Ambulatory Visit (INDEPENDENT_AMBULATORY_CARE_PROVIDER_SITE_OTHER): Payer: PPO | Admitting: Internal Medicine

## 2018-03-17 ENCOUNTER — Encounter: Payer: Self-pay | Admitting: Internal Medicine

## 2018-03-17 ENCOUNTER — Ambulatory Visit: Payer: PPO | Admitting: Internal Medicine

## 2018-03-17 VITALS — BP 118/78 | HR 57 | Temp 97.8°F | Resp 16 | Ht 70.0 in | Wt 179.2 lb

## 2018-03-17 DIAGNOSIS — I10 Essential (primary) hypertension: Secondary | ICD-10-CM

## 2018-03-17 DIAGNOSIS — Z09 Encounter for follow-up examination after completed treatment for conditions other than malignant neoplasm: Secondary | ICD-10-CM | POA: Diagnosis not present

## 2018-03-17 DIAGNOSIS — F329 Major depressive disorder, single episode, unspecified: Secondary | ICD-10-CM

## 2018-03-17 DIAGNOSIS — G5601 Carpal tunnel syndrome, right upper limb: Secondary | ICD-10-CM | POA: Diagnosis not present

## 2018-03-17 DIAGNOSIS — F419 Anxiety disorder, unspecified: Secondary | ICD-10-CM

## 2018-03-17 MED ORDER — METOPROLOL SUCCINATE ER 25 MG PO TB24: 75.0000 mg | ORAL_TABLET | Freq: Every day | ORAL | 3 refills | Status: DC

## 2018-03-17 NOTE — Telephone Encounter (Signed)
Pt has appt today- will make him aware.

## 2018-03-17 NOTE — Telephone Encounter (Signed)
Unfortunately I do not have time to get onto the phone w/ insurance. He will need to call them to make them aware of med change and his need for early refill.

## 2018-03-17 NOTE — Telephone Encounter (Signed)
Patient calling back about this medication- states insurance still cannot approve this medication so pharmacy will not fill it. Patient is requesting a call back, asap as he will be out of this medication before Monday.

## 2018-03-17 NOTE — Patient Instructions (Signed)
   GO TO THE FRONT DESK Schedule your next appointment    A check up in 3 months

## 2018-03-17 NOTE — Progress Notes (Signed)
Subjective:    Patient ID: Kevin Bass, male    DOB: Aug 09, 1940, 78 y.o.   MRN: [id number]  DOS:  03/17/2018 Type of visit - description: f/u  Seen by cardiology 03/07/2018, at the time BP was 130/68.  They recommended new BP cuff and readjusted the timing of his medications. Here lately, his BPs are very good. Anxiety is under much better control.  Review of Systems No chest pain, no lower extremity edema  Past Medical History:  Diagnosis Date  . Allergy   . Anxiety   . Arthritis    hands  . Atrial fibrillation ([place])   . Colon cancer [facility])    s/p partial colectomy 1990, Cscope neg 7-09, next 2014  . DISORDER, MITRAL VALVE    MV recontruction at [place] 2006----still needs ABX for SBE prophylaxis   . Elevated PSA    prostate nodule, [name]  . GERD (gastroesophageal reflux disease)   . Heart murmur    no problems since surgery  . HYPERGLYCEMIA, BORDERLINE 10/16/2008  . HYPERLIPIDEMIA 06/15/2006   diet controlled  . Hypertension   . HYPERTROPHY PROSTATE W/O UR OBST & OTH LUTS 10/16/2008  . Neuropathy   . Right groin pain    Dr. Redmond Pulling  . SCC (squamous cell carcinoma) 06/2017   s/p Mohs---[name]  . Stroke ([place])   . Wears partial dentures    lower partial    Past Surgical History:  Procedure Laterality Date  . COLECTOMY  1990  . HERNIA REPAIR Left 2014   [facility] rep w/mesh- March 2014  . LOOP RECORDER INSERTION N/A 12/01/2016   Procedure: LOOP RECORDER INSERTION;  Surgeon: Evans Lance, MD;  Location: [place] CV LAB;  Service: Cardiovascular;  Laterality: N/A;  . MITRAL VALVE ANNULOPLASTY     [facility] 2006  . TEE WITHOUT CARDIOVERSION N/A 10/05/2016   Procedure: TRANSESOPHAGEAL ECHOCARDIOGRAM (TEE);  Surgeon: [name], MD;  Location: [facility] ENDOSCOPY;  Service: Cardiovascular;  Laterality: N/A;    Social History   Socioeconomic History  . Marital status: Widowed    Spouse name: suzanne  . Number of children: 2  . Years of education: college  .  Highest education level: Not on file  Occupational History  . Occupation: retired   Scientific laboratory technician  . Financial resource strain: Not on file  . Food insecurity:    Worry: Not on file    Inability: Not on file  . Transportation needs:    Medical: Not on file    Non-medical: Not on file  Tobacco Use  . Smoking status: Former Smoker    Packs/day: 0.50    Years: 20.00    Pack years: 10.00    Types: Cigarettes    Last attempt to quit: 02/23/1983    Years since quitting: 35.0  . Smokeless tobacco: Never Used  Substance and Sexual Activity  . Alcohol use: Yes    Alcohol/week: 7.0 standard drinks    Types: 7 Glasses of wine per week  . Drug use: No  . Sexual activity: Not Currently  Lifestyle  . Physical activity:    Days per week: Not on file    Minutes per session: Not on file  . Stress: Not on file  Relationships  . Social connections:    Talks on phone: Not on file    Gets together: Not on file    Attends religious service: Not on file    Active member of club or organization: Not on file  Attends meetings of clubs or organizations: Not on file    Relationship status: Not on file  . Intimate partner violence:    Fear of current or ex partner: Not on file    Emotionally abused: Not on file    Physically abused: Not on file    Forced sexual activity: Not on file  Other Topics Concern  . Not on file  Social History Narrative   From [place], lost wife 08/2017 to cancer   daughter in [place], son in [place] as of 03/17/2018      Reactions   Statins Other (See Comments)   Myalgias (intolerance)      Medication List       Accurate as of March 17, 2018  3:27 PM. Always use your most recent med list.        ALPRAZolam 0.25 MG tablet Commonly known as:  XANAX TAKE ONE OR TWO TABLETS BY MOUTH TWICE DAILY AS NEEDED FOR ANXIETY   Arginine 500 MG Caps Take 1,500 mg by mouth daily.   ascorbic acid 500 MG/5ML syrup Commonly known as:  VITAMIN C Take  1,000 mg by mouth 2 (two) times daily.   ELIQUIS 5 MG Tabs tablet Generic drug:  apixaban TAKE ONE TABLET BY MOUTH TWICE DAILY   ezetimibe 10 MG tablet Commonly known as:  ZETIA Take 1 tablet (10 mg total) by mouth daily.   FISH OIL PO Take 1,000 mg by mouth daily.   gabapentin 300 MG capsule Commonly known as:  NEURONTIN Take 2 capsules (600mg ) in the Am and 3 capsules (900mg ) in the evening   JOINT HEALTH PO Take 1 tablet by mouth daily. JARROW JOINT BUILDER   LEXAPRO 10 MG tablet Generic drug:  escitalopram Take 1 tablet (10 mg total) by mouth daily.   loratadine 10 MG tablet Commonly known as:  CLARITIN Take 10 mg by mouth at bedtime.   losartan 25 MG tablet Commonly known as:  COZAAR Take 25 mg by mouth daily.   LYCOPENE PO Take 1 capsule by mouth 2 (two) times daily. JARROW FORMULA LYCO-SORB PROSTATE SUPPORT   metoprolol succinate 25 MG 24 hr tablet Commonly known as:  TOPROL-XL Take 3 tablets (75 mg total) by mouth daily.   OCUVITE PRESERVISION PO Take 1 tablet by mouth 2 (two) times daily.   rosuvastatin 40 MG tablet Commonly known as:  CRESTOR Take 1 tablet (40 mg total) by mouth daily.           Objective:   Physical Exam BP 118/78 (BP Location: Left Arm, Patient Position: Sitting, Cuff Size: Small)   Pulse (!) 57   Temp 97.8 F (36.6 C) (Oral)   Resp 16   Ht 5\' 10"  (1.778 m)   Wt 179 lb 4 oz (81.3 kg)   SpO2 98%   BMI 25.72 kg/m  General:   Well developed, NAD, BMI noted. HEENT:  Normocephalic . Face symmetric, atraumatic Lungs:  CTA B Normal respiratory effort, no intercostal retractions, no accessory muscle use. Heart: RRR,  no murmur.  No pretibial edema bilaterally  Skin: Not pale. Not jaundice Neurologic:  alert & oriented X3.  Speech normal, gait appropriate for age and unassisted Psych--  Cognition and judgment appear intact.  Cooperative with normal attention span and concentration.  Behavior appropriate. No anxious or  depressed appearing.      Assessment    Assessment Prediabetes HTN Hyperlipidemia, statin intolerant (simva-pravachol: shoulder pain) Anxiety-  rarely takes xanax Neuro  --Facial neuropathy dx 2015, no w/u, sx resurface x1 after temporary d/c of meds  --saw neuro again 02/2017, labs (-), had a NCS, Rx gaba CV: --Mitral valve annuloplasty 2006-- needs SBE prophylaxis -- SOB 04-2016, [facility] at [facility], had a ECHO Nl fx MV, cath: no CAD --Cryptogenic stroke 09/12/2016, had slurred speech. 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 [name] Colon cancer partial colectomy 1990, colonoscopy 08-2007 negative, Cscope again 09-2012 normal, 5 years  SCC . MOHS 06/2017  PLAN:   HTN: Currently on losartan 2 tablets in the morning and  metoprolol XL 25 mg  3 tablets in the morning, he is doing well. See last visit, could not tolerate HCTZ due to dizziness. He had some issues refilling his metoprolol, I called the pharmacy, apparently his insurance is declining a refill.  I sent a new prescription, will try again and call the insurance company next week if unable to refill his prescription. Anxiety: Lexapro dose increased last time, doing better. RTC 3 months.

## 2018-03-17 NOTE — Progress Notes (Signed)
Pre visit review using our clinic review tool, if applicable. No additional management support is needed unless otherwise documented below in the visit note. 

## 2018-03-18 NOTE — Assessment & Plan Note (Signed)
HTN: Currently on losartan 2 tablets in the morning and  metoprolol XL 25 mg  3 tablets in the morning, he is doing well. See last visit, could not tolerate HCTZ due to dizziness. He had some issues refilling his metoprolol, I called the pharmacy, apparently his insurance is declining a refill.  I sent a new prescription, will try again and call the insurance company next week if unable to refill his prescription. Anxiety: Lexapro dose increased last time, doing better. RTC 3 months.

## 2018-03-20 ENCOUNTER — Other Ambulatory Visit: Payer: Self-pay | Admitting: Internal Medicine

## 2018-03-20 MED ORDER — ESCITALOPRAM OXALATE 10 MG PO TABS: 10.0000 mg | ORAL_TABLET | Freq: Every day | ORAL | 3 refills | Status: DC

## 2018-03-20 NOTE — Telephone Encounter (Signed)
Copied from [place] [id number]. Topic: Quick Communication - Rx Refill/Question >> Mar 20, 2018  3:04 PM Scherrie Gerlach wrote: Medication: escitalopram (LEXAPRO) 10 MG tablet  Pt states he started out cutting this in half taking 5mg , but was told ok to take a whole 10 mg. Pt needs a new Rx sent to [id number] [address], [place] [phone] (Phone) [phone] (Fax)

## 2018-03-20 NOTE — Telephone Encounter (Signed)
Rx sent 

## 2018-04-03 DIAGNOSIS — M25572 Pain in left ankle and joints of left foot: Secondary | ICD-10-CM | POA: Diagnosis not present

## 2018-04-03 DIAGNOSIS — M205X2 Other deformities of toe(s) (acquired), left foot: Secondary | ICD-10-CM | POA: Diagnosis not present

## 2018-04-03 DIAGNOSIS — M21962 Unspecified acquired deformity of left lower leg: Secondary | ICD-10-CM | POA: Diagnosis not present

## 2018-04-03 DIAGNOSIS — M7752 Other enthesopathy of left foot: Secondary | ICD-10-CM | POA: Diagnosis not present

## 2018-04-10 ENCOUNTER — Encounter: Payer: Self-pay | Admitting: Internal Medicine

## 2018-04-10 DIAGNOSIS — M79672 Pain in left foot: Secondary | ICD-10-CM

## 2018-04-12 DIAGNOSIS — M25572 Pain in left ankle and joints of left foot: Secondary | ICD-10-CM | POA: Diagnosis not present

## 2018-04-14 ENCOUNTER — Encounter: Payer: Self-pay | Admitting: Family Medicine

## 2018-04-14 ENCOUNTER — Ambulatory Visit (INDEPENDENT_AMBULATORY_CARE_PROVIDER_SITE_OTHER): Payer: PPO | Admitting: Family Medicine

## 2018-04-14 VITALS — BP 122/72 | HR 68 | Temp 99.1°F | Ht 70.0 in | Wt 186.2 lb

## 2018-04-14 DIAGNOSIS — J069 Acute upper respiratory infection, unspecified: Secondary | ICD-10-CM

## 2018-04-14 DIAGNOSIS — B9789 Other viral agents as the cause of diseases classified elsewhere: Secondary | ICD-10-CM | POA: Diagnosis not present

## 2018-04-14 NOTE — Progress Notes (Signed)
Chief Complaint  Patient presents with  . Sore Throat  . Cough    congestion    Kevin Bass here for [name] complaints.  Duration: 2 days  Associated symptoms: rhinorrhea and cough Denies: sinus congestion, sinus pain, itchy watery eyes, ear pain, ear drainage, sore throat, wheezing, shortness of breath, myalgia and fevers Treatment to date: Tylenol Sick contacts: No  ROS:  Const: Denies fevers HEENT: As noted in HPI Lungs: No SOB  Past Medical History:  Diagnosis Date  . Allergy   . Anxiety   . Arthritis    hands  . Atrial fibrillation ([place])   . Colon cancer [facility])    s/p partial colectomy 1990, Cscope neg 7-09, next 2014  . DISORDER, MITRAL VALVE    MV recontruction at [place] 2006----still needs ABX for SBE prophylaxis   . Elevated PSA    prostate nodule, [name]  . GERD (gastroesophageal reflux disease)   . Heart murmur    no problems since surgery  . HYPERGLYCEMIA, BORDERLINE 10/16/2008  . HYPERLIPIDEMIA 06/15/2006   diet controlled  . Hypertension   . HYPERTROPHY PROSTATE W/O UR OBST & OTH LUTS 10/16/2008  . Neuropathy   . Right groin pain    Dr. Redmond Pulling  . SCC (squamous cell carcinoma) 06/2017   s/p Mohs---[name]  . Stroke ([place])   . Wears partial dentures    lower partial    BP 122/72 (BP Location: Left Arm, Patient Position: Sitting, Cuff Size: Normal)   Pulse 68   Temp 99.1 F (37.3 C) (Oral)   Ht 5\' 10"  (1.778 m)   Wt 186 lb 4 oz (84.5 kg)   SpO2 96%   BMI 26.72 kg/m  General: Awake, alert, appears stated age HEENT: AT, [place], ears patent b/l and TM's neg, nares patent w/o discharge, pharynx pink and without exudates, MMM Neck: No masses or asymmetry Heart: RRR Lungs: CTAB, no accessory muscle use Psych: Age appropriate judgment and insight, normal mood and affect  Viral [name] with cough  Mucinex, air humidifier, elevate bed, continue to push fluids, practice good hand hygiene, cover mouth when coughing. F/u prn. If starting to experience  fevers, shaking, or shortness of breath, seek immediate care. Pt voiced understanding and agreement to the plan.  [place], DO 04/14/18 3:42 PM

## 2018-04-14 NOTE — Patient Instructions (Signed)
Continue to push fluids, practice good hand hygiene, and cover your mouth if you cough.  If you start having fevers, shaking or shortness of breath, seek immediate care.  Guaifenesin (Mucinex is brand name) is available over the counter and can help break up secretions that are contributing to your cough.   For symptoms, consider using Vick's VapoRub on chest or under nose, air humidifier, Benadryl at night, and elevating the head of the bed. Tylenol and ibuprofen for aches and pains you may be experiencing.   Let us know if you need anything.

## 2018-04-17 ENCOUNTER — Ambulatory Visit: Payer: PPO | Admitting: Internal Medicine

## 2018-04-20 ENCOUNTER — Encounter: Payer: Self-pay | Admitting: Podiatry

## 2018-04-20 ENCOUNTER — Other Ambulatory Visit: Payer: Self-pay | Admitting: Podiatry

## 2018-04-20 ENCOUNTER — Ambulatory Visit (INDEPENDENT_AMBULATORY_CARE_PROVIDER_SITE_OTHER): Payer: PPO

## 2018-04-20 ENCOUNTER — Ambulatory Visit: Payer: PPO | Admitting: Podiatry

## 2018-04-20 VITALS — BP 133/61

## 2018-04-20 DIAGNOSIS — M79672 Pain in left foot: Secondary | ICD-10-CM

## 2018-04-20 DIAGNOSIS — M2042 Other hammer toe(s) (acquired), left foot: Secondary | ICD-10-CM

## 2018-04-20 DIAGNOSIS — M7752 Other enthesopathy of left foot: Secondary | ICD-10-CM

## 2018-04-20 DIAGNOSIS — M779 Enthesopathy, unspecified: Secondary | ICD-10-CM

## 2018-04-20 MED ORDER — TRIAMCINOLONE ACETONIDE 10 MG/ML IJ SUSP
10.0000 mg | Freq: Once | INTRAMUSCULAR | Status: AC
  Administered 2018-04-20: 10 mg

## 2018-04-20 NOTE — Progress Notes (Signed)
Subjective:   Patient ID: Kevin Bass, male   DOB: 78 y.o.   MRN: [id number]   HPI Patient presents stating his had a lot of problems with the second digit of his left foot being elevated and painful and he is on Eliquis and needs to stay on it as he has had a stroke in the past when taken off it.  Patient has good digital perfusion well oriented x3   ROS      Objective:  Physical Exam  Severe hammertoe deformity with rigid contracture digit to left over right with significant structural bunion under lapping the hallux left with fluid buildup around the inner phalangeal joint left second toe that is painful with good digital perfusion well oriented x3     Assessment:  Significant structural deformity is the biggest part of his problem along with inflammatory capsulitis digit to left with rigid elevation of the digit and pain     Plan:  H&P discussed condition working to try to be conservative with his history of stroke and on Eliquis.  I did do a proximal nerve block I then went ahead and under sterile conditions I did a careful injection of the interphalangeal joint 1 mg dexamethasone 1 mg Kenalog debrided the lesion applied padding to take pressure off it and applied brace to lower the toe.  If symptoms persist will get a need to consider surgical straightening of the toe with bunion correction and will have to speak with physician concerning his blood thinner Eliquis usage  X-ray indicates there is severe elevation second digit left with rigid contracture at the MPJ

## 2018-04-24 ENCOUNTER — Ambulatory Visit (INDEPENDENT_AMBULATORY_CARE_PROVIDER_SITE_OTHER): Payer: PPO | Admitting: Family Medicine

## 2018-04-24 ENCOUNTER — Encounter: Payer: Self-pay | Admitting: Family Medicine

## 2018-04-24 VITALS — BP 132/72 | HR 78 | Temp 98.6°F | Ht 70.0 in | Wt 182.4 lb

## 2018-04-24 DIAGNOSIS — B9689 Other specified bacterial agents as the cause of diseases classified elsewhere: Secondary | ICD-10-CM | POA: Diagnosis not present

## 2018-04-24 DIAGNOSIS — J208 Acute bronchitis due to other specified organisms: Secondary | ICD-10-CM

## 2018-04-24 MED ORDER — AZITHROMYCIN 250 MG PO TABS: ORAL_TABLET | ORAL | 0 refills | Status: DC

## 2018-04-24 NOTE — Progress Notes (Signed)
CC: [name]  Kevin Bass here for [name] complaints.  Duration: 2 weeks  Associated symptoms: Fever (100.5 F max), rhinorrhea and productive cough Denies: sinus congestion, sinus pain, itchy watery eyes, ear pain, ear drainage, sore throat, wheezing, shortness of breath, myalgia and other new symptoms since last visit Treatment to date: Mucinex, Tylenol Sick contacts: No  ROS:  Const: +fevers HEENT: As noted in HPI Lungs: No SOB  Past Medical History:  Diagnosis Date  . Allergy   . Anxiety   . Arthritis    hands  . Atrial fibrillation ([place])   . Colon cancer [facility])    s/p partial colectomy 1990, Cscope neg 7-09, next 2014  . DISORDER, MITRAL VALVE    MV recontruction at [place] 2006----still needs ABX for SBE prophylaxis   . Elevated PSA    prostate nodule, [name]  . GERD (gastroesophageal reflux disease)   . Heart murmur    no problems since surgery  . HYPERGLYCEMIA, BORDERLINE 10/16/2008  . HYPERLIPIDEMIA 06/15/2006   diet controlled  . Hypertension   . HYPERTROPHY PROSTATE W/O UR OBST & OTH LUTS 10/16/2008  . Neuropathy   . Right groin pain    Dr. Redmond Pulling  . SCC (squamous cell carcinoma) 06/2017   s/p Mohs---[name]  . Stroke ([place])   . Wears partial dentures    lower partial    BP 132/72 (BP Location: Left Arm, Patient Position: Sitting, Cuff Size: Normal)   Pulse 78   Temp 98.6 F (37 C) (Oral)   Ht 5\' 10"  (1.778 m)   Wt 182 lb 6 oz (82.7 kg)   SpO2 95%   BMI 26.17 kg/m  General: Awake, alert, appears stated age HEENT: AT, [place], ears patent b/l and TM's neg, nares patent w/o discharge, pharynx pink and without exudates, MMM Neck: No masses or asymmetry Heart: RRR Lungs: coarse breath sounds, worse in RLL field, no accessory muscle use Psych: Age appropriate judgment and insight, normal mood and affect  Acute bacterial bronchitis - Plan: azithromycin (ZITHROMAX) 250 MG tablet  Orders as above. Tx given the fever. Continue to push fluids, practice good  hand hygiene, cover mouth when coughing. F/u prn. If starting to experience increasing fevers, shaking, or shortness of breath, seek immediate care. Pt voiced understanding and agreement to the plan.  [place], DO 04/24/18 3:21 PM

## 2018-04-24 NOTE — Patient Instructions (Signed)
Continue to push fluids, practice good hand hygiene, and cover your mouth if you cough.  If you start having fevers, shaking or shortness of breath, seek immediate care.  For symptoms, consider using Vick's VapoRub on chest or under nose, air humidifier, Benadryl at night, and elevating the head of the bed. Tylenol and ibuprofen for aches and pains you may be experiencing.   Stay on Mucinex for now.  Send me a MyChart message on Thursday morning/Wednesday evening if you are not getting better.  Let us know if you need anything.

## 2018-04-27 ENCOUNTER — Encounter: Payer: Self-pay | Admitting: Family Medicine

## 2018-05-01 ENCOUNTER — Ambulatory Visit (INDEPENDENT_AMBULATORY_CARE_PROVIDER_SITE_OTHER): Payer: PPO | Admitting: *Deleted

## 2018-05-01 DIAGNOSIS — I639 Cerebral infarction, unspecified: Secondary | ICD-10-CM

## 2018-05-02 LAB — CUP PACEART REMOTE DEVICE CHECK
Date Time Interrogation Session: 20200308123609
Implantable Pulse Generator Implant Date: 20181010

## 2018-05-09 NOTE — Progress Notes (Signed)
Carelink Summary Report / Loop Recorder 

## 2018-05-10 ENCOUNTER — Ambulatory Visit: Payer: PPO | Admitting: Internal Medicine

## 2018-05-11 ENCOUNTER — Ambulatory Visit: Payer: PPO | Admitting: Podiatry

## 2018-05-28 ENCOUNTER — Other Ambulatory Visit: Payer: Self-pay | Admitting: Internal Medicine

## 2018-05-29 ENCOUNTER — Telehealth: Payer: Self-pay

## 2018-05-29 NOTE — Telephone Encounter (Signed)
   Cardiac Questionnaire:    Since your last visit or hospitalization:    1. Have you been having new or worsening chest pain? NO   2. Have you been having new or worsening shortness of breath? NO 3. Have you been having new or worsening leg swelling, wt gain, or increase in abdominal girth (pants fitting more tightly)? NO   4. Have you had any passing out spells? NO

## 2018-06-06 ENCOUNTER — Ambulatory Visit: Payer: PPO | Admitting: Internal Medicine

## 2018-06-12 ENCOUNTER — Encounter: Payer: Self-pay | Admitting: Internal Medicine

## 2018-06-12 ENCOUNTER — Ambulatory Visit (INDEPENDENT_AMBULATORY_CARE_PROVIDER_SITE_OTHER): Payer: PPO | Admitting: Internal Medicine

## 2018-06-12 ENCOUNTER — Other Ambulatory Visit: Payer: Self-pay

## 2018-06-12 DIAGNOSIS — I10 Essential (primary) hypertension: Secondary | ICD-10-CM | POA: Diagnosis not present

## 2018-06-12 DIAGNOSIS — F329 Major depressive disorder, single episode, unspecified: Secondary | ICD-10-CM

## 2018-06-12 DIAGNOSIS — E785 Hyperlipidemia, unspecified: Secondary | ICD-10-CM | POA: Diagnosis not present

## 2018-06-12 DIAGNOSIS — I480 Paroxysmal atrial fibrillation: Secondary | ICD-10-CM | POA: Diagnosis not present

## 2018-06-12 DIAGNOSIS — F32A Depression, unspecified: Secondary | ICD-10-CM

## 2018-06-12 DIAGNOSIS — F419 Anxiety disorder, unspecified: Secondary | ICD-10-CM

## 2018-06-12 NOTE — Assessment & Plan Note (Signed)
HTN: Currently on losartan, metoprolol.  Ideally I would do a BMP but we both agree that is better not to bring him to the office.  Will do on RTC Hyperlipidemia: Good compliance with Crestor/Zetia. Anxiety: On Lexapro, hardly ever takes Xanax.  Symptoms are controlled Atrial fibrillation: Good compliance with medications including Eliquis Neuropathy: Self decrease gabapentin to 1 in the afternoon and 3 at night.  Does not like to take more than that. COVID-19: Patient is doing very well with CDC guidelines. RTC 3 to 4 months.

## 2018-06-12 NOTE — Progress Notes (Signed)
Subjective:    Patient ID: Kevin Bass, male    DOB: 1940-04-19, 78 y.o.   MRN: [id number]  DOS:  06/12/2018 Type of visit - description: Virtual Visit via Video Note  I connected with@ on 06/12/18 at  1:20 PM EDT by a video enabled telemedicine application and verified that I am speaking with the correct person using two identifiers.   THIS ENCOUNTER IS A VIRTUAL VISIT DUE TO COVID-19 - PATIENT WAS NOT SEEN IN THE OFFICE. PATIENT HAS CONSENTED TO VIRTUAL VISIT / TELEMEDICINE VISIT   Location of patient: home  Location of provider: office  I discussed the limitations of evaluation and management by telemedicine and the availability of in person appointments. The patient expressed understanding and agreed to proceed.  History of Present Illness: Routine office visit Since the last visit he is doing well. We review his medications and recent labs. He is following all the COVID-19 precautions. His family is helping him.  He has a daughter in [place]. He self decreased the gabapentin dose.   Review of Systems Denies fever chills No chest pain no difficulty breathing Reports some phlegm accumulation on the throat, thinks due to allergies, taking Claritin, symptoms improved  Past Medical History:  Diagnosis Date  . Allergy   . Anxiety   . Arthritis    hands  . Atrial fibrillation ([place])   . Colon cancer [facility])    s/p partial colectomy 1990, Cscope neg 7-09, next 2014  . DISORDER, MITRAL VALVE    MV recontruction at [place] 2006----still needs ABX for SBE prophylaxis   . Elevated PSA    prostate nodule, [name]  . GERD (gastroesophageal reflux disease)   . Heart murmur    no problems since surgery  . HYPERGLYCEMIA, BORDERLINE 10/16/2008  . HYPERLIPIDEMIA 06/15/2006   diet controlled  . Hypertension   . HYPERTROPHY PROSTATE W/O UR OBST & OTH LUTS 10/16/2008  . Neuropathy   . Right groin pain    Dr. Redmond Pulling  . SCC (squamous cell carcinoma) 06/2017   s/p Mohs---Dr  Milana Na  . Stroke ([place])   . Wears partial dentures    lower partial    Past Surgical History:  Procedure Laterality Date  . COLECTOMY  1990  . HERNIA REPAIR Left 2014   [facility] rep w/mesh- March 2014  . LOOP RECORDER INSERTION N/A 12/01/2016   Procedure: LOOP RECORDER INSERTION;  Surgeon: Evans Lance, MD;  Location: [place] [facility];  Service: Cardiovascular;  Laterality: N/A;  . MITRAL VALVE ANNULOPLASTY     [facility] 2006  . TEE WITHOUT CARDIOVERSION N/A 10/05/2016   Procedure: TRANSESOPHAGEAL ECHOCARDIOGRAM (TEE);  Surgeon: [name], MD;  Location: [facility] ENDOSCOPY;  Service: Cardiovascular;  Laterality: N/A;    Social History   Socioeconomic History  . Marital status: Widowed    Spouse name: suzanne  . Number of children: 2  . Years of education: college  . Highest education level: Not on file  Occupational History  . Occupation: retired   Scientific laboratory technician  . Financial resource strain: Not on file  . Food insecurity:    Worry: Not on file    Inability: Not on file  . Transportation needs:    Medical: Not on file    Non-medical: Not on file  Tobacco Use  . Smoking status: Former Smoker    Packs/day: 0.50    Years: 20.00    Pack years: 10.00    Types: Cigarettes    Last attempt to  quit: 02/23/1983    Years since quitting: 35.3  . Smokeless tobacco: Never Used  Substance and Sexual Activity  . Alcohol use: Yes    Alcohol/week: 7.0 standard drinks    Types: 7 Glasses of wine per week  . Drug use: No  . Sexual activity: Not Currently  Lifestyle  . Physical activity:    Days per week: Not on file    Minutes per session: Not on file  . Stress: Not on file  Relationships  . Social connections:    Talks on phone: Not on file    Gets together: Not on file    Attends religious service: Not on file    Active member of club or organization: Not on file    Attends meetings of clubs or organizations: Not on file    Relationship status: Not on file  . Intimate partner  violence:    Fear of current or ex partner: Not on file    Emotionally abused: Not on file    Physically abused: Not on file    Forced sexual activity: Not on file  Other Topics Concern  . Not on file  Social History Narrative   From [place], lost wife 08/2017 to cancer   daughter in [place], son in [place] as of 06/12/2018      Reactions   Statins Other (See Comments)   Myalgias (intolerance)      Medication List       Accurate as of June 12, 2018  1:15 PM. Always use your most recent med list.        ALPRAZolam 0.25 MG tablet Commonly known as:  XANAX TAKE ONE OR TWO TABLETS BY MOUTH TWICE DAILY AS NEEDED FOR ANXIETY   Arginine 500 MG Caps Take 1,500 mg by mouth daily.   ascorbic acid 500 MG/5ML syrup Commonly known as:  VITAMIN C Take 1,000 mg by mouth 2 (two) times daily.   azithromycin 250 MG tablet Commonly known as:  ZITHROMAX Take 2 tabs the first day and then 1 tab daily until you run out.   Eliquis 5 MG Tabs tablet Generic drug:  apixaban TAKE ONE TABLET BY MOUTH TWICE DAILY   escitalopram 10 MG tablet Commonly known as:  Lexapro Take 1 tablet (10 mg total) by mouth daily.   ezetimibe 10 MG tablet Commonly known as:  ZETIA Take 1 tablet (10 mg total) by mouth daily.   FISH OIL PO Take 1,000 mg by mouth daily.   gabapentin 300 MG capsule Commonly known as:  NEURONTIN Take 2 capsules (600mg ) in the Am and 3 capsules (900mg ) in the evening   JOINT HEALTH PO Take 1 tablet by mouth daily. JARROW JOINT BUILDER   loratadine 10 MG tablet Commonly known as:  CLARITIN Take 10 mg by mouth at bedtime.   losartan 25 MG tablet Commonly known as:  COZAAR Take 25 mg by mouth daily.   LYCOPENE PO Take 1 capsule by mouth 2 (two) times daily. JARROW FORMULA LYCO-SORB PROSTATE SUPPORT   metoprolol succinate 25 MG 24 hr tablet Commonly known as:  TOPROL-XL Take 3 tablets (75 mg total) by mouth daily.   OCUVITE PRESERVISION PO Take 1 tablet  by mouth 2 (two) times daily.   rosuvastatin 40 MG tablet Commonly known as:  CRESTOR Take 1 tablet (40 mg total) by mouth daily.           Objective:   Physical Exam  There were no vitals taken for this visit. This is a virtual video visit.  Patient is alert, oriented, x3.  Ambulatory at home without any apparent problems    Assessment     Assessment Prediabetes HTN Hyperlipidemia, statin intolerant (simva-pravachol: shoulder pain) Anxiety- rarely takes xanax Neuro  --Facial neuropathy dx 2015, no w/u, sx resurface x1 after temporary d/c of meds  --saw neuro again 02/2017, labs (-), had a NCS, Rx gaba CV: --Mitral valve annuloplasty 2006-- needs SBE prophylaxis -- SOB 04-2016, seen at [facility], had a ECHO Nl fx MV, cath: no CAD --Cryptogenic stroke 09/12/2016, had slurred speech. 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 [name] Colon cancer partial colectomy 1990, colonoscopy 08-2007 negative, Cscope again 09-2012 normal, 5 years  SCC . MOHS 06/2017  PLAN:   HTN: Currently on losartan, metoprolol.  Ideally I would do a BMP but we both agree that is better not to bring him to the office.  Will do on RTC Hyperlipidemia: Good compliance with Crestor/Zetia. Anxiety: On Lexapro, hardly ever takes Xanax.  Symptoms are controlled Atrial fibrillation: Good compliance with medications including Eliquis Neuropathy: Self decrease gabapentin to 1 in the afternoon and 3 at night.  Does not like to take more than that. COVID-19: Patient is doing very well with CDC guidelines. RTC 3 to 4 months.   I discussed the assessment and treatment plan with the patient. The patient was provided an opportunity to ask questions and all were answered. The patient agreed with the  plan and demonstrated an understanding of the instructions.   The patient was advised to call back or seek an in-person evaluation if the symptoms worsen or if the condition fails to improve as anticipated.

## 2018-06-16 NOTE — Telephone Encounter (Signed)
Called pt to schedule 3 mo appt, left voicemail asking him to call us so we can do this.   Rosaria Ferries, PA-C 06/16/2018 3:28 PM Beeper [phone]

## 2018-07-13 ENCOUNTER — Ambulatory Visit (INDEPENDENT_AMBULATORY_CARE_PROVIDER_SITE_OTHER): Payer: PPO | Admitting: Podiatry

## 2018-07-13 ENCOUNTER — Ambulatory Visit (INDEPENDENT_AMBULATORY_CARE_PROVIDER_SITE_OTHER): Payer: PPO

## 2018-07-13 ENCOUNTER — Other Ambulatory Visit: Payer: Self-pay | Admitting: Podiatry

## 2018-07-13 ENCOUNTER — Encounter: Payer: Self-pay | Admitting: Podiatry

## 2018-07-13 ENCOUNTER — Other Ambulatory Visit: Payer: Self-pay

## 2018-07-13 VITALS — Temp 98.1°F

## 2018-07-13 DIAGNOSIS — L84 Corns and callosities: Secondary | ICD-10-CM

## 2018-07-13 DIAGNOSIS — M2042 Other hammer toe(s) (acquired), left foot: Secondary | ICD-10-CM

## 2018-07-13 DIAGNOSIS — D689 Coagulation defect, unspecified: Secondary | ICD-10-CM

## 2018-07-13 DIAGNOSIS — D169 Benign neoplasm of bone and articular cartilage, unspecified: Secondary | ICD-10-CM | POA: Diagnosis not present

## 2018-07-13 DIAGNOSIS — M79675 Pain in left toe(s): Secondary | ICD-10-CM

## 2018-07-13 NOTE — Progress Notes (Signed)
Subjective:   Patient ID: Kevin Bass, male   DOB: 78 y.o.   MRN: [id number]   HPI Patient states his second toe is been okay but he is developed a lot of pain between the fifth and fourth toe left foot and underneath and states that they are sore and make it hard for him to wear shoe gear   ROS      Objective:  Physical Exam  Neurovascular status intact with distal medial keratotic lesion digit 5 left and on the base of the fourth digit left with rotation of the fifth toe and elevated second toe with structural bunion also noted     Assessment:  Distal medial exostotic lesion with keratotic tissue formation on the fifth and fourth digit and underneath the fourth metatarsal with rotational component to the digits     Plan:  H&P x-ray reviewed condition discussed and today aggressive debridement of lesions accomplished along with padding and discussed possibility for exostectomy for this particular condition.  Educated him on exostectomy with possible arthroplasty and reappoint as symptoms indicate  X-rays indicate significant distal rotation digit 5 left against the fourth digit left foot

## 2018-07-17 ENCOUNTER — Telehealth: Payer: Self-pay | Admitting: Internal Medicine

## 2018-07-18 NOTE — Telephone Encounter (Signed)
Please advise on losartan dosage. On med list is losartan 25mg  once daily. Pharmacy is requesting losartan 25mg  bid.

## 2018-07-18 NOTE — Telephone Encounter (Signed)
I spoke with the patient, losartan is twice a day. RX sent  Also, he self decrease metoprolol to 2 tablets daily as BP here lately has been slightly low. Consider decrease metoprolol "officially" to 2 tablets daily on RTC

## 2018-07-30 ENCOUNTER — Other Ambulatory Visit: Payer: Self-pay | Admitting: Internal Medicine

## 2018-07-31 ENCOUNTER — Ambulatory Visit (INDEPENDENT_AMBULATORY_CARE_PROVIDER_SITE_OTHER): Payer: PPO | Admitting: *Deleted

## 2018-07-31 DIAGNOSIS — I639 Cerebral infarction, unspecified: Secondary | ICD-10-CM | POA: Diagnosis not present

## 2018-07-31 DIAGNOSIS — I480 Paroxysmal atrial fibrillation: Secondary | ICD-10-CM

## 2018-07-31 LAB — CUP PACEART REMOTE DEVICE CHECK
Date Time Interrogation Session: 20200607134113
Implantable Pulse Generator Implant Date: 20181010

## 2018-07-31 NOTE — Telephone Encounter (Signed)
Pt is a 78 yr old male who last saw Dr. Debara Pickett on 03/07/18. Last noted weight was 82.7Kg on 04/24/18. SCr on 12/13/17 was 0.96. Will refill Eliquis 5mg  BID per specified criteria.

## 2018-08-09 ENCOUNTER — Other Ambulatory Visit: Payer: Self-pay

## 2018-08-09 ENCOUNTER — Ambulatory Visit (INDEPENDENT_AMBULATORY_CARE_PROVIDER_SITE_OTHER): Payer: PPO | Admitting: Internal Medicine

## 2018-08-09 ENCOUNTER — Encounter: Payer: Self-pay | Admitting: Internal Medicine

## 2018-08-09 VITALS — BP 141/71 | HR 70 | Temp 98.4°F | Resp 16 | Ht 70.0 in | Wt 186.0 lb

## 2018-08-09 DIAGNOSIS — F419 Anxiety disorder, unspecified: Secondary | ICD-10-CM

## 2018-08-09 DIAGNOSIS — I10 Essential (primary) hypertension: Secondary | ICD-10-CM

## 2018-08-09 DIAGNOSIS — J301 Allergic rhinitis due to pollen: Secondary | ICD-10-CM

## 2018-08-09 DIAGNOSIS — F329 Major depressive disorder, single episode, unspecified: Secondary | ICD-10-CM | POA: Diagnosis not present

## 2018-08-09 LAB — COMPREHENSIVE METABOLIC PANEL
ALT: 17 U/L (ref 0–53)
AST: 17 U/L (ref 0–37)
Albumin: 4 g/dL (ref 3.5–5.2)
Alkaline Phosphatase: 51 U/L (ref 39–117)
BUN: 17 mg/dL (ref 6–23)
CO2: 31 mEq/L (ref 19–32)
Calcium: 9 mg/dL (ref 8.4–10.5)
Chloride: 104 mEq/L (ref 96–112)
Creatinine, Ser: 0.92 mg/dL (ref 0.40–1.50)
GFR: 79.17 mL/min (ref >60.00)
Glucose, Bld: 111 mg/dL — ABNORMAL HIGH (ref 70–99)
Potassium: 4.4 mEq/L (ref 3.5–5.1)
Sodium: 140 mEq/L (ref 135–145)
Total Bilirubin: 0.5 mg/dL (ref 0.2–1.2)
Total Protein: 6.2 g/dL (ref 6.0–8.3)

## 2018-08-09 NOTE — Progress Notes (Signed)
Subjective:    Patient ID: Kevin Bass, male    DOB: [birth year], 78 y.o.   MRN: [id number]  DOS:  08/09/2018 Type of visit - description:  ROV In general feeling well Self decrease Lexapro.  Doing well emotionally COVID-19: Following excellent precautions Neuropathy: Currently well controlled History of allergies, on Claritin for years, here lately symptoms are slightly worse: More phlegm pooled at the throat early in the morning, some postnasal dripping. He denies fever, chills, cough, sneezing or itchy eyes or throat. HTN: Decrease metoprolol to 2 tablets daily, ambulatory BPs were slightly low at some point.   Review of Systems  No chest pain no difficulty breathing No lower extremity edema No nausea, vomiting, diarrhea Past Medical History:  Diagnosis Date  . Allergy   . Anxiety   . Arthritis    hands  . Atrial fibrillation ([place])   . Colon cancer [facility])    s/p partial colectomy 1990, Cscope neg 7-09, next 2014  . DISORDER, MITRAL VALVE    MV recontruction at [place] 2006----still needs ABX for SBE prophylaxis   . Elevated PSA    prostate nodule, [name]  . GERD (gastroesophageal reflux disease)   . Heart murmur    no problems since surgery  . HYPERGLYCEMIA, BORDERLINE 10/16/2008  . HYPERLIPIDEMIA 06/15/2006   diet controlled  . Hypertension   . HYPERTROPHY PROSTATE W/O UR OBST & OTH LUTS 10/16/2008  . Neuropathy   . Right groin pain    Dr. Redmond Pulling  . SCC (squamous cell carcinoma) 06/2017   s/p Mohs---[name]  . Stroke ([place])   . Wears partial dentures    lower partial    Past Surgical History:  Procedure Laterality Date  . COLECTOMY  1990  . HERNIA REPAIR Left 2014   [facility] rep w/mesh- March 2014  . LOOP RECORDER INSERTION N/A 12/01/2016   Procedure: LOOP RECORDER INSERTION;  Surgeon: Evans Lance, MD;  Location: [place] CV LAB;  Service: Cardiovascular;  Laterality: N/A;  . MITRAL VALVE ANNULOPLASTY     [facility] 2006  . TEE WITHOUT CARDIOVERSION N/A  10/05/2016   Procedure: TRANSESOPHAGEAL ECHOCARDIOGRAM (TEE);  Surgeon: [name], MD;  Location: [facility] ENDOSCOPY;  Service: Cardiovascular;  Laterality: N/A;    Social History   Socioeconomic History  . Marital status: Widowed    Spouse name: suzanne  . Number of children: 2  . Years of education: college  . Highest education level: Not on file  Occupational History  . Occupation: retired   Scientific laboratory technician  . Financial resource strain: Not on file  . Food insecurity    Worry: Not on file    Inability: Not on file  . Transportation needs    Medical: Not on file    Non-medical: Not on file  Tobacco Use  . Smoking status: Former Smoker    Packs/day: 0.50    Years: 20.00    Pack years: 10.00    Types: Cigarettes    Quit date: 02/23/1983    Years since quitting: 35.4  . Smokeless tobacco: Never Used  Substance and Sexual Activity  . Alcohol use: Yes    Alcohol/week: 7.0 standard drinks    Types: 7 Glasses of wine per week  . Drug use: No  . Sexual activity: Not Currently  Lifestyle  . Physical activity    Days per week: Not on file    Minutes per session: Not on file  . Stress: Not on file  Relationships  .  Social Herbalist on phone: Not on file    Gets together: Not on file    Attends religious service: Not on file    Active member of club or organization: Not on file    Attends meetings of clubs or organizations: Not on file    Relationship status: Not on file  . Intimate partner violence    Fear of current or ex partner: Not on file    Emotionally abused: Not on file    Physically abused: Not on file    Forced sexual activity: Not on file  Other Topics Concern  . Not on file  Social History Narrative   From [place], lost wife 08/2017 to cancer   daughter in [place], son in [place] as of 08/09/2018      Reactions   Statins Other (See Comments)   Myalgias (intolerance)      Medication List       Accurate as of August 09, 2018 10:08  AM. If you have any questions, ask your nurse or doctor.        ALPRAZolam 0.25 MG tablet Commonly known as: XANAX TAKE ONE OR TWO TABLETS BY MOUTH TWICE DAILY AS NEEDED FOR ANXIETY   Arginine 500 MG Caps Take 1,500 mg by mouth daily.   ascorbic acid 500 MG/5ML syrup Commonly known as: VITAMIN C Take 1,000 mg by mouth 2 (two) times daily.   Eliquis 5 MG Tabs tablet Generic drug: apixaban TAKE 1 TABLET BY MOUTH TWICE A DAY   escitalopram 10 MG tablet Commonly known as: Lexapro Take 1 tablet (10 mg total) by mouth daily.   ezetimibe 10 MG tablet Commonly known as: ZETIA Take 1 tablet (10 mg total) by mouth daily.   FISH OIL PO Take 1,000 mg by mouth daily.   gabapentin 300 MG capsule Commonly known as: NEURONTIN Take 1 capsules (300mg ) in the Am and 3 capsules (900mg ) in the evening   JOINT HEALTH PO Take 1 tablet by mouth daily. JARROW JOINT BUILDER   loratadine 10 MG tablet Commonly known as: CLARITIN Take 10 mg by mouth at bedtime.   losartan 25 MG tablet Commonly known as: COZAAR Take 1 tablet (25 mg total) by mouth 2 (two) times daily.   LYCOPENE PO Take 1 capsule by mouth 2 (two) times daily. JARROW FORMULA LYCO-SORB PROSTATE SUPPORT   metoprolol succinate 25 MG 24 hr tablet Commonly known as: TOPROL-XL Take 3 tablets (75 mg total) by mouth daily.   OCUVITE PRESERVISION PO Take 1 tablet by mouth 2 (two) times daily.   omeprazole 40 MG capsule Commonly known as: PRILOSEC TK 1 C PO QD   rosuvastatin 40 MG tablet Commonly known as: CRESTOR Take 1 tablet (40 mg total) by mouth daily.           Objective:   Physical Exam BP (!) 141/71 (BP Location: Right Arm, Patient Position: Sitting, Cuff Size: Small)   Pulse 70   Temp 98.4 F (36.9 C) (Oral)   Resp 16   Ht 5\' 10"  (1.778 m)   Wt 186 lb (84.4 kg)   SpO2 100%   BMI 26.69 kg/m  General:   Well developed, NAD, BMI noted. HEENT:  Normocephalic . Face symmetric, atraumatic. TMs normal.   Nose not congested, sinuses no TTP.  Throat without lesions. Lungs:  CTA B Normal respiratory effort, no intercostal retractions, no accessory muscle use. Heart: RRR,  no  murmur.  No pretibial edema bilaterally  Skin: Not pale. Not jaundice Neurologic:  alert & oriented X3.  Speech normal, gait appropriate for age and unassisted Psych--  Cognition and judgment appear intact.  Cooperative with normal attention span and concentration.  Behavior appropriate. No anxious or depressed appearing.      Assessment     Assessment Prediabetes HTN Hyperlipidemia, statin intolerant (simva-pravachol: shoulder pain) Anxiety- rarely takes xanax Neuro  --Facial neuropathy dx 2015, no w/u, sx resurface x1 after temporary d/c of meds  --saw neuro again 02/2017, labs (-), had a NCS, Rx gaba CV: --Mitral valve annuloplasty 2006-- needs SBE prophylaxis -- SOB 04-2016, seen at [facility], had a ECHO Nl fx MV, cath: no CAD --Cryptogenic stroke 09/12/2016, had slurred speech. 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 [name] Colon cancer partial colectomy 1990, colonoscopy 08-2007 negative, Cscope again 09-2012 normal, 5 years  SCC . MOHS 06/2017  PLAN:   HTN: Ambulatory BPs varies and at some point was as low as 102/65.  He is currently taking losartan twice a day but decrease metoprolol from 3 tablets to 2 tablets.  BP today slightly high, recommend close monitoring at home, increase metoprolol back to 3 tablets if needed or call.  See AVS.  Check a CMP. Anxiety: Stopped Lexapro several weeks ago, doing well emotionally, rarely takes Xanax. Atrial fibrillation: Anticoagulated, self decrease metoprolol, see comments under HTN.  He seems regular today. Neuropathy: Well-controlled  with gabapentin 1 tablet in the morning and 3 in the afternoon Allergies: Still has some symptoms despite taking Claritin, recommend to add Flonase Preventive care: Recommend early high-dose flu shot this season. RTC 4 months CPX.

## 2018-08-09 NOTE — Progress Notes (Signed)
Carelink Summary Report / Loop Recorder 

## 2018-08-09 NOTE — Progress Notes (Signed)
Pre visit review using our clinic review tool, if applicable. No additional management support is needed unless otherwise documented below in the visit note. 

## 2018-08-09 NOTE — Patient Instructions (Addendum)
Get the blood work     [place] Schedule your next appointment for a physical, fasting in 4 months     Check the  blood pressure 2 or 3 times a   Weekly GOAL is between 110/65 and  135/85. If it is consistently higher or lower, let me know   get a high dose flu shot early in this season   Add [facility]

## 2018-08-10 NOTE — Assessment & Plan Note (Signed)
PLAN:   HTN: Ambulatory BPs varies and at some point was as low as 102/65.  He is currently taking losartan twice a day but decrease metoprolol from 3 tablets to 2 tablets.  BP today slightly high, recommend close monitoring at home, increase metoprolol back to 3 tablets if needed or call.  See AVS.  Check a CMP. Anxiety: Stopped Lexapro several weeks ago, doing well emotionally, rarely takes Xanax. Atrial fibrillation: Anticoagulated, self decrease metoprolol, see comments under HTN.  He seems regular today. Neuropathy: Well-controlled with gabapentin 1 tablet in the morning and 3 in the afternoon Allergies: Still has some symptoms despite taking Claritin, recommend to add Flonase Preventive care: Recommend early high-dose flu shot this season. RTC 4 months CPX.

## 2018-08-23 ENCOUNTER — Telehealth: Payer: Self-pay | Admitting: Neurology

## 2018-08-23 ENCOUNTER — Encounter: Payer: Self-pay | Admitting: Podiatry

## 2018-08-23 ENCOUNTER — Ambulatory Visit: Payer: PPO | Admitting: Podiatry

## 2018-08-23 ENCOUNTER — Other Ambulatory Visit: Payer: Self-pay

## 2018-08-23 VITALS — Temp 98.2°F

## 2018-08-23 DIAGNOSIS — M2042 Other hammer toe(s) (acquired), left foot: Secondary | ICD-10-CM | POA: Diagnosis not present

## 2018-08-23 DIAGNOSIS — M779 Enthesopathy, unspecified: Secondary | ICD-10-CM | POA: Diagnosis not present

## 2018-08-23 NOTE — Telephone Encounter (Signed)
Patient states that he sent a mychart message and no one has gotten back with him. He needs to talk to someone about a his neuropathy please call

## 2018-08-24 NOTE — Progress Notes (Signed)
Subjective:   Patient ID: Kevin Bass, male   DOB: 78 y.o.   MRN: [id number]   HPI Patient states that he is concerned about his structural abnormality of his digits and he has developed a lot of pain again on the plantar foot.  States that it did improve quite a bit for around 3 months but has started to become painful again   ROS      Objective:  Physical Exam  Neurovascular status intact with patient's left plantar foot showing inflammation and pain but moderate in its intensity with fluid buildup around the second MPJ.  Patient does have significant structural malalignment of the digits with significant elevation of the lesser digits with rigid contracture of the second digit     Assessment:  Inflammatory capsulitis with rigid contracture digit     Plan:  H&P condition reviewed and at this point I did do sterile prep and injected the capsule 3 mg Kenalog 5 mg Xylocaine and advised on anti-inflammatories and then discussed digital fusion with possible metatarsal osteotomy but I did discuss that this would be a fairly complex procedure with a significant amount of recovery

## 2018-09-01 ENCOUNTER — Other Ambulatory Visit: Payer: Self-pay | Admitting: Internal Medicine

## 2018-09-18 ENCOUNTER — Encounter: Payer: Self-pay | Admitting: Internal Medicine

## 2018-09-21 ENCOUNTER — Ambulatory Visit: Payer: PPO | Admitting: Internal Medicine

## 2018-09-21 ENCOUNTER — Other Ambulatory Visit: Payer: Self-pay

## 2018-09-21 ENCOUNTER — Encounter: Payer: Self-pay | Admitting: Internal Medicine

## 2018-09-21 VITALS — BP 122/82 | HR 64 | Temp 97.3°F | Ht 70.0 in | Wt 184.4 lb

## 2018-09-21 DIAGNOSIS — I480 Paroxysmal atrial fibrillation: Secondary | ICD-10-CM | POA: Diagnosis not present

## 2018-09-21 DIAGNOSIS — Z9889 Other specified postprocedural states: Secondary | ICD-10-CM

## 2018-09-21 DIAGNOSIS — I10 Essential (primary) hypertension: Secondary | ICD-10-CM

## 2018-09-21 NOTE — Progress Notes (Signed)
OFFICE CONSULT NOTE  Chief Complaint:  No complaints  Primary Care Physician: Colon Branch, MD  HPI:  Kevin Bass is a 77 y.o. male who is being seen today for the evaluation of workup of cryptogenic stroke at the request of Colon Branch, MD. Kevin Bass is a pleasant 78 year old patient who recently had an unfortunate cryptogenic stroke. His past ocular history significant for mitral valve disease status post repair at [facility]. He is followed by cardiologist there and recently was admitted to [facility] with cryptogenic stroke. As per routine care or transesophageal echocardiogram as well as a implanted loop recorder were recommended. Unfortunately there was difficulty during the transesophageal echocardiogram procedure which involved technical difficulties with equipment and it was not able to be completed. There was some concern that the probe could not be passed adequately and he underwent upper esophageal imaging which was normal. He has had prior teas without any difficulty. After that he decided against a loop recorder and wanted to get another opinion from his cardiologist at [facility]. There was a visit there which did not indicate that a transesophageal echocardiogram would be helpful. I do not see any evidence on his prior echocardiograms of a bubble study to exclude PFO. This could be a significant finding as there is evidence now that closure of a small PFO in the setting of a patient whose had a cryptogenic stroke is guideline indicated. It's also possible that he could have atrial fibrillation, especially given his history of mitral valve disease and valve repair which is a significant risk factor for arrhythmias.  12/21/2016  Kevin Bass was seen today in follow-up.  He underwent a repeat echocardiogram which did not demonstrate any LV thrombus or evidence for atrial septal defect.  Subsequently he had placement of an implanted loop recorder by Dr. Lovena Le which was seen in  follow-up and appears to be well-healed.  There is no evidence today of any significant arrhythmias such as atrial fibrillation.  He is currently on aspirin and Plavix for stroke prevention.  He is also had increasing doses of medication to control hypertension.  Currently his blood pressure is well controlled at 108/70.  He is appropriately on rosuvastatin, ezetimibe and fish oil with an LDL-C is 60 approximately 3 weeks ago.  06/17/2017  Kevin Bass returns today for follow-up.  Overall he says he feels fairly well.  He denies any significant palpitations or recurrent atrial fibrillation.  EKG today shows normal sinus rhythm at 62.  He has been working with Dr. [name] for neuropathy.  Is currently on very high dose of gabapentin (a total of 1500 mg daily).  He is tolerating apixaban without any bleeding difficulty and cholesterol has been well controlled on rosuvastatin and ezetimibe.  Only, today he is complaining of some lower extremity discomfort and minimal edema.  He says this is worse on long car rides.  He does have notable bilateral lower extremity varicosities, and would likely benefit from compression stockings.  07/11/2017  Kevin Bass was seen today in follow-up.  I last saw him about a month ago.  He was doing well without any symptomatic A. fib.  His A. fib was detected by implanted loop recorder.  He has been on anticoagulation.  Most recently he was in the emergency department for symptomatic atrial fibrillation with heart rate as high as 200 when he was seen by EMS.  He was treated and seen in the emergency department.  He was started on metoprolol and converted  back to sinus rhythm.  He had questions about the value of the implanted loop recorder, however I reminded him that his job was not to notify you of arrhythmias as they were happening.    03/07/2018  Kevin Bass returns today for follow-up.  Recently he has been reported labile blood pressure.  He notes that he is having spikes  in his blood pressure mostly in the afternoons at times up to 170 although has some lower blood pressures in the morning.  I personally reviewed his blood pressure cuff (which incidentally is close to 80 years old) and straining some lability in his blood pressures.  Currently he is taking 75 mg of metoprolol in the morning along with 50 mg of losartan and was also prescribed to take an additional hydrochlorothiazide.  He said the hydrochlorothiazide was making him dizzy and therefore he has stopped the medication.  Of note he did have a recurrent TIA affecting the right PICA territory.  There was some associated transient vertigo.  This apparently happened after holding his Eliquis for 2 days due to a scheduled median and ulnar nerve decompression.  It was felt that A. fib was the cause of this however it seems fairly unlikely that he is developed thrombus during that period of time to cause stroke.  I do believe there is likely another explanation for this cause.  I do not want him to feel that he could never come off of blood thinners because of the risk for stroke.  09/21/2018  Kevin Bass was seen today in follow-up.  Overall he seems to be doing better.  Fortunately had no further strokes after that episode where he discontinued his Eliquis for 4 doses.  He has decreased his Toprol-XL down to 25 mg from 75 mg daily.  Blood pressure is well controlled and heart rate is in the low 60s.  He continues to have remote checks without any significant findings.  The plan is to allow the device to run out of battery life and then he may consider either explantation or just leaving it.  He is on Eliquis without any bleeding complications.  He last had an echo in October 2019 which showed stability of his mitral valve repair.  PMHx:  Past Medical History:  Diagnosis Date  . Allergy   . Anxiety   . Arthritis    hands  . Atrial fibrillation ([place])   . Colon cancer [facility])    s/p partial colectomy 1990, Cscope neg  7-09, next 2014  . DISORDER, MITRAL VALVE    MV recontruction at [facility] 2006----still needs ABX for SBE prophylaxis   . Elevated PSA    prostate nodule, [name]  . GERD (gastroesophageal reflux disease)   . Heart murmur    no problems since surgery  . HYPERGLYCEMIA, BORDERLINE 10/16/2008  . HYPERLIPIDEMIA 06/15/2006   diet controlled  . Hypertension   . HYPERTROPHY PROSTATE W/O UR OBST & OTH LUTS 10/16/2008  . Neuropathy   . Right groin pain    Dr. Redmond Pulling  . SCC (squamous cell carcinoma) 06/2017   s/p Mohs---[name]  . Stroke ([place])   . Wears partial dentures    lower partial    Past Surgical History:  Procedure Laterality Date  . COLECTOMY  1990  . HERNIA REPAIR Left 2014   [facility] rep w/mesh- March 2014  . LOOP RECORDER INSERTION N/A 12/01/2016   Procedure: LOOP RECORDER INSERTION;  Surgeon: Evans Lance, MD;  Location: [facility] INVASIVE CV  LAB;  Service: Cardiovascular;  Laterality: N/A;  . MITRAL VALVE ANNULOPLASTY     [facility] 2006  . TEE WITHOUT CARDIOVERSION N/A 10/05/2016   Procedure: TRANSESOPHAGEAL ECHOCARDIOGRAM (TEE);  Surgeon: [name], MD;  Location: [facility] ENDOSCOPY;  Service: Cardiovascular;  Laterality: N/A;    FAMHx:  Family History  Problem Relation Age of Onset  . Colon cancer Other        M Bass (cousins)  . Diabetes Neg Hx   . Stroke Neg Hx   . CAD Neg Hx   . Prostate cancer Neg Hx   . Rectal cancer Neg Hx   . Esophageal cancer Neg Hx     SOCHx:   reports that he quit smoking about 35 years ago. His smoking use included cigarettes. He has a 10.00 pack-year smoking history. He has never used smokeless tobacco. He reports current alcohol use of about 7.0 standard drinks of alcohol per week. He reports that he does not use drugs.  ALLERGIES:  Allergies  Allergen Reactions  . Statins Other (See Comments)    Myalgias (intolerance)    ROS: Pertinent items noted in HPI and remainder of comprehensive ROS otherwise negative.  HOME MEDS: Current  Outpatient Medications on File Prior to Visit  Medication Sig Dispense Refill  . ALPRAZolam (XANAX) 0.25 MG tablet TAKE ONE OR TWO TABLETS BY MOUTH TWICE DAILY AS NEEDED FOR ANXIETY 40 tablet 1  . Arginine 500 MG CAPS Take 1,500 mg by mouth daily.    Marland Kitchen ascorbic acid (VITAMIN C) 500 MG/5ML syrup Take 1,000 mg by mouth 2 (two) times daily.    Marland Kitchen ELIQUIS 5 MG TABS tablet TAKE 1 TABLET BY MOUTH TWICE A DAY 60 tablet 4  . ezetimibe (ZETIA) 10 MG tablet Take 1 tablet (10 mg total) by mouth daily. 90 tablet 2  . gabapentin (NEURONTIN) 300 MG capsule Take 1 capsules (300mg ) in the Am and 3 capsules (900mg ) in the evening    . Glucosamine-MSM-Hyaluronic Acd (JOINT HEALTH PO) Take 1 tablet by mouth daily. JARROW JOINT BUILDER    . loratadine (CLARITIN) 10 MG tablet Take 10 mg by mouth at bedtime.     Marland Kitchen losartan (COZAAR) 25 MG tablet Take 1 tablet (25 mg total) by mouth 2 (two) times daily. 180 tablet 1  . LYCOPENE PO Take 1 capsule by mouth 2 (two) times daily. JARROW FORMULA LYCO-SORB PROSTATE SUPPORT     . metoprolol succinate (TOPROL-XL) 25 MG 24 hr tablet Take 3 tablets (75 mg total) by mouth daily. (Patient taking differently: Take 25 mg by mouth daily. ) 270 tablet 3  . Multiple Vitamins-Minerals (OCUVITE PRESERVISION PO) Take 1 tablet by mouth 2 (two) times daily.     . Omega-3 Fatty Acids (FISH OIL PO) Take 1,000 mg by mouth daily.     Marland Kitchen omeprazole (PRILOSEC) 40 MG capsule Take 1 capsule (40 mg total) by mouth daily. 90 capsule 3  . rosuvastatin (CRESTOR) 40 MG tablet Take 1 tablet (40 mg total) by mouth daily. 90 tablet 1   No current facility-administered medications on file prior to visit.     LABS/IMAGING: No results found for this or any previous visit (from the past 48 hour(s)). No results found.  LIPID PANEL:    Component Value Date/Time   CHOL 94 01/09/2018 1058   TRIG 116.0 01/09/2018 1058   HDL 41.30 01/09/2018 1058   CHOLHDL 2 01/09/2018 1058   VLDL 23.2 01/09/2018 1058    LDLCALC 29 01/09/2018 1058  LDLDIRECT 149.1 08/30/2012 1352    WEIGHTS: Wt Readings from Last 3 Encounters:  09/21/18 184 lb 6.4 oz (83.6 kg)  08/09/18 186 lb (84.4 kg)  04/24/18 182 lb 6 oz (82.7 kg)    VITALS: BP 122/82   Pulse 64   Temp (!) 97.3 F (36.3 C)   Ht 5\' 10"  (1.778 m)   Wt 184 lb 6.4 oz (83.6 kg)   SpO2 94%   BMI 26.46 kg/m   EXAM: General appearance: alert and no distress Neck: no carotid bruit, no JVD and thyroid not enlarged, symmetric, no tenderness/mass/nodules Lungs: clear to auscultation bilaterally Heart: regular rate and rhythm, S1, S2 normal, no murmur, click, rub or gallop Abdomen: soft, non-tender; bowel sounds normal; no masses,  no organomegaly Extremities: extremities normal, atraumatic, no cyanosis or edema and varicose veins noted Pulses: 2+ and symmetric Skin: Skin color, texture, turgor normal. No rashes or lesions Neurologic: Grossly normal Psych: Pleasant  EKG: Normal sinus rhythm at 65-personally reviewed  ASSESSMENT: 1. Labile blood pressure 2. Recurrent stroke 3. Status post mitral valve repair 4. A-fib - S/p ILR 5. CHADSVASC Score of 4 - on Eliquis 6. Essential hypertension 7. Dyslipidemia  PLAN: 1.   Mr. Cardozo has done well on anticoagulation.  Fortunately has had no further strokes as long as he is remained on the medication.  His mitral valve repair is been stable.  His blood pressure is now more stable as well.  No evidence of A. fib on EKG today.  He remains on Eliquis without bleeding difficulty.  His cholesterols been well controlled and also monitored by his PCP.  Plan follow-up with me in 6 months or sooner as necessary.  [name], MD, [facility], [place] Director of the Advanced Lipid Disorders &  Cardiovascular Risk Reduction Clinic Diplomate of the American Board of Clinical Lipidology Attending Cardiologist  Direct Dial: [phone]  Fax: [phone]  Website:   [email or web address]  Nadean Corwin Wylene Weissman 09/21/2018, 2:11 PM

## 2018-09-21 NOTE — Patient Instructions (Signed)
Medication Instructions:  Your physician recommends that you continue on your current medications as directed. Please refer to the Current Medication list given to you today.  If you need a refill on your cardiac medications before your next appointment, please call your pharmacy.   Follow-Up: At [facility], you and your health needs are our priority.  As part of our continuing mission to provide you with exceptional heart care, we have created designated Provider Care Teams.  These Care Teams include your primary Cardiologist (physician) and Advanced Practice Providers (APPs -  Physician Assistants and Nurse Practitioners) who all work together to provide you with the care you need, when you need it. You will need a follow up appointment in 6 months.  Please call our office 2 months in advance to schedule this appointment.  You may see Dr. Debara Pickett or one of the following Advanced Practice Providers on your designated Care Team: Almyra Deforest, [place] . Fabian Sharp, PA-C

## 2018-10-03 ENCOUNTER — Ambulatory Visit: Payer: PPO | Admitting: Neurology

## 2018-10-17 ENCOUNTER — Encounter: Payer: PPO | Admitting: Internal Medicine

## 2018-10-31 ENCOUNTER — Ambulatory Visit (INDEPENDENT_AMBULATORY_CARE_PROVIDER_SITE_OTHER): Payer: PPO | Admitting: *Deleted

## 2018-10-31 DIAGNOSIS — I639 Cerebral infarction, unspecified: Secondary | ICD-10-CM

## 2018-10-31 LAB — CUP PACEART REMOTE DEVICE CHECK
Date Time Interrogation Session: 20200906140936
Implantable Pulse Generator Implant Date: 20181010

## 2018-11-09 ENCOUNTER — Other Ambulatory Visit: Payer: Self-pay

## 2018-11-09 ENCOUNTER — Ambulatory Visit: Payer: PPO | Admitting: Podiatry

## 2018-11-09 ENCOUNTER — Encounter: Payer: Self-pay | Admitting: Podiatry

## 2018-11-09 DIAGNOSIS — M779 Enthesopathy, unspecified: Secondary | ICD-10-CM | POA: Diagnosis not present

## 2018-11-09 DIAGNOSIS — D169 Benign neoplasm of bone and articular cartilage, unspecified: Secondary | ICD-10-CM

## 2018-11-09 DIAGNOSIS — L84 Corns and callosities: Secondary | ICD-10-CM

## 2018-11-09 DIAGNOSIS — D689 Coagulation defect, unspecified: Secondary | ICD-10-CM | POA: Diagnosis not present

## 2018-11-10 ENCOUNTER — Telehealth: Payer: Self-pay | Admitting: Internal Medicine

## 2018-11-10 NOTE — Telephone Encounter (Signed)
Please advise 

## 2018-11-10 NOTE — Telephone Encounter (Signed)
FYI - Today was her first visit with the Pt and she took his BP it was 158/82. Pt stated his BP was low last week and started decreasing his Losartan and only taking 1 and 1/2 tabs ?please call if you have any questions

## 2018-11-10 NOTE — Telephone Encounter (Signed)
Tried calling Sadie- was just an FYI. Will reach out to Pt via mychart w/ recommendations.

## 2018-11-10 NOTE — Telephone Encounter (Signed)
Recommend to continue with the same medications but check the blood pressures daily. As long as they remain between 110 and 135 we are okay. Having occasionally a low or high reading is typically okay.

## 2018-11-13 ENCOUNTER — Other Ambulatory Visit: Payer: Self-pay

## 2018-11-13 ENCOUNTER — Ambulatory Visit (INDEPENDENT_AMBULATORY_CARE_PROVIDER_SITE_OTHER): Payer: PPO | Admitting: Internal Medicine

## 2018-11-13 VITALS — BP 146/74 | HR 59 | Temp 97.1°F | Resp 16 | Ht 70.0 in | Wt 183.1 lb

## 2018-11-13 DIAGNOSIS — I10 Essential (primary) hypertension: Secondary | ICD-10-CM | POA: Diagnosis not present

## 2018-11-13 DIAGNOSIS — Z23 Encounter for immunization: Secondary | ICD-10-CM | POA: Diagnosis not present

## 2018-11-13 DIAGNOSIS — I480 Paroxysmal atrial fibrillation: Secondary | ICD-10-CM | POA: Diagnosis not present

## 2018-11-13 LAB — CBC WITH DIFFERENTIAL/PLATELET
Basophils Absolute: 0 10*3/uL (ref 0.0–0.1)
Basophils Relative: 0.5 % (ref 0.0–3.0)
Eosinophils Absolute: 0.1 10*3/uL (ref 0.0–0.7)
Eosinophils Relative: 1.3 % (ref 0.0–5.0)
HCT: 42.1 % (ref 39.0–52.0)
Hemoglobin: 14.2 g/dL (ref 13.0–17.0)
Lymphocytes Relative: 20 % (ref 12.0–46.0)
Lymphs Abs: 1.3 10*3/uL (ref 0.7–4.0)
MCHC: 33.7 g/dL (ref 30.0–36.0)
MCV: 83.9 fl (ref 78.0–100.0)
Monocytes Absolute: 0.4 10*3/uL (ref 0.1–1.0)
Monocytes Relative: 6.6 % (ref 3.0–12.0)
Neutro Abs: 4.5 10*3/uL (ref 1.4–7.7)
Neutrophils Relative %: 71.6 % (ref 43.0–77.0)
Platelets: 203 10*3/uL (ref 150.0–400.0)
RBC: 5.02 Mil/uL (ref 4.22–5.81)
RDW: 14 % (ref 11.5–15.5)
WBC: 6.3 10*3/uL (ref 4.0–10.5)

## 2018-11-13 LAB — BASIC METABOLIC PANEL
BUN: 14 mg/dL (ref 6–23)
CO2: 33 mEq/L — ABNORMAL HIGH (ref 19–32)
Calcium: 9.5 mg/dL (ref 8.4–10.5)
Chloride: 102 mEq/L (ref 96–112)
Creatinine, Ser: 0.96 mg/dL (ref 0.40–1.50)
GFR: 75.32 mL/min (ref >60.00)
Glucose, Bld: 99 mg/dL (ref 70–99)
Potassium: 4.4 mEq/L (ref 3.5–5.1)
Sodium: 139 mEq/L (ref 135–145)

## 2018-11-13 NOTE — Progress Notes (Signed)
Subjective:   Patient ID: Kevin Bass, male   DOB: 78 y.o.   MRN: [id number]   HPI Patient presents stating that his forearm right little toe has been very tender and the treatment only helped for a short period of time I also still have pain in my forefoot   ROS      Objective:  Physical Exam  Neurovascular status intact with patient found to have significant foot structural issues left with inflammation around the lesser MPJs and also keratotic lesion with a lot of pain of the fifth digit left distal medial aspect of the digit     Assessment:  Exostosis digit 5 left with pain and deformity along with inflammatory capsulitis left     Plan:  H&P reviewed condition discussing them separately and for the fifth digit due to longstanding nature and pain I recommended exostectomy and explained procedure and risk he wants procedure and allowed him to sign consent form after extensive review.  Patient will have this done in the office and is completely aware of surgical intervention and for the forefoot I did do sterile prep and injected the second MPJ 2 mg dexamethasone 3 mg Xylocaine and advised on continued supportive therapy but do not recommend forefoot reconstruction for these problems at this point  X-rays indicate significant structural malalignment of the foot

## 2018-11-13 NOTE — Progress Notes (Signed)
Subjective:    Patient ID: Kevin Bass, male    DOB: [birth year], 78 y.o.   MRN: [id number]  DOS:  11/13/2018 Type of visit - description: Acute Patient is concerned about his blood pressure, he has been relatively well controlled taking the following: Losartan 1 tablet Metoprolol 2 tablets  In the last few days, BP has been in the 170s. This morning he took 2 losartan and 3 metoprolols.  BP this morning is better   Review of Systems Denies chest pain, shortness of breath No edema Salt  intake at baseline No NSAIDs Stress at baseline. Had a mild headache today but otherwise no neurological symptoms.  Past Medical History:  Diagnosis Date  . Allergy   . Anxiety   . Arthritis    hands  . Atrial fibrillation ([place])   . Colon cancer [facility])    s/p partial colectomy 1990, Cscope neg 7-09, next 2014  . DISORDER, MITRAL VALVE    MV recontruction at [place] 2006----still needs ABX for SBE prophylaxis   . Elevated PSA    prostate nodule, [name]  . GERD (gastroesophageal reflux disease)   . Heart murmur    no problems since surgery  . HYPERGLYCEMIA, BORDERLINE 10/16/2008  . HYPERLIPIDEMIA 06/15/2006   diet controlled  . Hypertension   . HYPERTROPHY PROSTATE W/O UR OBST & OTH LUTS 10/16/2008  . Neuropathy   . Right groin pain    Dr. Redmond Pulling  . SCC (squamous cell carcinoma) 06/2017   s/p Mohs---[name]  . Stroke ([place])   . Wears partial dentures    lower partial    Past Surgical History:  Procedure Laterality Date  . COLECTOMY  1990  . HERNIA REPAIR Left 2014   [facility] rep w/mesh- March 2014  . LOOP RECORDER INSERTION N/A 12/01/2016   Procedure: LOOP RECORDER INSERTION;  Surgeon: Evans Lance, MD;  Location: [place] CV LAB;  Service: Cardiovascular;  Laterality: N/A;  . MITRAL VALVE ANNULOPLASTY     [facility] 2006  . TEE WITHOUT CARDIOVERSION N/A 10/05/2016   Procedure: TRANSESOPHAGEAL ECHOCARDIOGRAM (TEE);  Surgeon: [name], MD;  Location: [facility] ENDOSCOPY;   Service: Cardiovascular;  Laterality: N/A;    Social History   Socioeconomic History  . Marital status: Widowed    Spouse name: suzanne  . Number of children: 2  . Years of education: college  . Highest education level: Not on file  Occupational History  . Occupation: retired   Scientific laboratory technician  . Financial resource strain: Not on file  . Food insecurity    Worry: Not on file    Inability: Not on file  . Transportation needs    Medical: Not on file    Non-medical: Not on file  Tobacco Use  . Smoking status: Former Smoker    Packs/day: 0.50    Years: 20.00    Pack years: 10.00    Types: Cigarettes    Quit date: 02/23/1983    Years since quitting: 35.7  . Smokeless tobacco: Never Used  Substance and Sexual Activity  . Alcohol use: Yes    Alcohol/week: 7.0 standard drinks    Types: 7 Glasses of wine per week  . Drug use: No  . Sexual activity: Not Currently  Lifestyle  . Physical activity    Days per week: Not on file    Minutes per session: Not on file  . Stress: Not on file  Relationships  . Social Herbalist on phone: Not  on file    Gets together: Not on file    Attends religious service: Not on file    Active member of club or organization: Not on file    Attends meetings of clubs or organizations: Not on file    Relationship status: Not on file  . Intimate partner violence    Fear of current or ex partner: Not on file    Emotionally abused: Not on file    Physically abused: Not on file    Forced sexual activity: Not on file  Other Topics Concern  . Not on file  Social History Narrative   From [place], lost wife 08/2017 to cancer   daughter in [place], son in [place] as of 11/13/2018      Reactions   Statins Other (See Comments)   Myalgias (intolerance)      Medication List       Accurate as of November 13, 2018  8:24 PM. If you have any questions, ask your nurse or doctor.        ALPRAZolam 0.25 MG tablet Commonly known as:  XANAX TAKE ONE OR TWO TABLETS BY MOUTH TWICE DAILY AS NEEDED FOR ANXIETY   Arginine 500 MG Caps Take 1,500 mg by mouth daily.   ascorbic acid 500 MG/5ML syrup Commonly known as: VITAMIN C Take 1,000 mg by mouth 2 (two) times daily.   Eliquis 5 MG Tabs tablet Generic drug: apixaban TAKE 1 TABLET BY MOUTH TWICE A DAY   ezetimibe 10 MG tablet Commonly known as: ZETIA Take 1 tablet (10 mg total) by mouth daily.   FISH OIL PO Take 1,000 mg by mouth daily.   gabapentin 300 MG capsule Commonly known as: NEURONTIN Take 1 capsules (300mg ) in the Am and 3 capsules (900mg ) in the evening   JOINT HEALTH PO Take 1 tablet by mouth daily. JARROW JOINT BUILDER   loratadine 10 MG tablet Commonly known as: CLARITIN Take 10 mg by mouth at bedtime.   losartan 25 MG tablet Commonly known as: COZAAR Take 1 tablet (25 mg total) by mouth 2 (two) times daily.   LYCOPENE PO Take 1 capsule by mouth 2 (two) times daily. JARROW FORMULA LYCO-SORB PROSTATE SUPPORT   metoprolol succinate 25 MG 24 hr tablet Commonly known as: TOPROL-XL Take 3 tablets (75 mg total) by mouth daily. What changed: how much to take   OCUVITE PRESERVISION PO Take 1 tablet by mouth 2 (two) times daily.   omeprazole 40 MG capsule Commonly known as: PRILOSEC Take 1 capsule (40 mg total) by mouth daily.   rosuvastatin 40 MG tablet Commonly known as: CRESTOR Take 1 tablet (40 mg total) by mouth daily.           Objective:   Physical Exam BP (!) 146/74 (BP Location: Left Arm, Patient Position: Sitting, Cuff Size: Small)   Pulse (!) 59   Temp (!) 97.1 F (36.2 C) (Temporal)   Resp 16   Ht 5\' 10"  (1.778 m)   Wt 183 lb 2 oz (83.1 kg)   SpO2 99%   BMI 26.28 kg/m  General:   Well developed, NAD, BMI noted. HEENT:  Normocephalic . Face symmetric, atraumatic Lungs:  CTA B Normal respiratory effort, no intercostal retractions, no accessory muscle use. Heart: RRR,  no murmur.  No pretibial edema bilaterally   Skin: Not pale. Not jaundice Neurologic:  alert & oriented X3.  Speech normal, gait appropriate for age and  unassisted Psych--  Cognition and judgment appear intact.  Cooperative with normal attention span and concentration.  Behavior appropriate. No anxious or depressed appearing.      Assessment    Assessment Prediabetes HTN Hyperlipidemia, statin intolerant (simva-pravachol: shoulder pain) Anxiety- rarely takes xanax Neuro  --Facial neuropathy dx 2015, no w/u, sx resurface x1 after temporary d/c of meds  --saw neuro again 02/2017, labs (-), had a NCS, Rx gaba CV: --Mitral valve annuloplasty 2006-- needs SBE prophylaxis -- SOB 04-2016, seen at [facility], had a ECHO Nl fx MV, cath: no CAD --Cryptogenic stroke 09/12/2016, had slurred speech. 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 [name] Colon cancer partial colectomy 1990, colonoscopy 08-2007 negative, Cscope again 09-2012 normal, 5 years  SCC . MOHS 06/2017  PLAN:   HTN: Previously well controlled on 1 tablet of losartan and 2 of metoprolol, slightly elevated in the last few days. No clear trigger for elevated blood pressure, see review of systems Plan: BMP, CBC take losartan 2 tablets daily and metoprolol 3 tablets daily, monitor BPs. Paroxysmal A. fib: Last visit with cardiology 09/21/2018, felt to be stable, EKG showed no A. fib. Preventive care: Flu shot today RTC already scheduled for next month

## 2018-11-13 NOTE — Assessment & Plan Note (Signed)
HTN: Previously well controlled on 1 tablet of losartan and 2 of metoprolol, slightly elevated in the last few days. No clear trigger for elevated blood pressure, see review of systems Plan: BMP, CBC take losartan 2 tablets daily and metoprolol 3 tablets daily, monitor BPs. Paroxysmal A. fib: Last visit with cardiology 09/21/2018, felt to be stable, EKG showed no A. fib. Preventive care: Flu shot today RTC already scheduled for next month

## 2018-11-13 NOTE — Patient Instructions (Addendum)
Please schedule Medicare Wellness with Glenard Haring.   GO TO THE LAB : Get the blood work     Check the  blood pressure   daily BP GOAL is between 110/65 and  135/85. If it is consistently higher or lower, let me know

## 2018-11-13 NOTE — Progress Notes (Signed)
Pre visit review using our clinic review tool, if applicable. No additional management support is needed unless otherwise documented below in the visit note. 

## 2018-11-14 DIAGNOSIS — Z7901 Long term (current) use of anticoagulants: Secondary | ICD-10-CM | POA: Diagnosis not present

## 2018-11-14 DIAGNOSIS — Z87891 Personal history of nicotine dependence: Secondary | ICD-10-CM | POA: Diagnosis not present

## 2018-11-14 DIAGNOSIS — Z8673 Personal history of transient ischemic attack (TIA), and cerebral infarction without residual deficits: Secondary | ICD-10-CM | POA: Diagnosis not present

## 2018-11-14 DIAGNOSIS — H6122 Impacted cerumen, left ear: Secondary | ICD-10-CM | POA: Diagnosis not present

## 2018-11-14 DIAGNOSIS — Z7289 Other problems related to lifestyle: Secondary | ICD-10-CM | POA: Diagnosis not present

## 2018-11-14 DIAGNOSIS — H9313 Tinnitus, bilateral: Secondary | ICD-10-CM | POA: Diagnosis not present

## 2018-11-16 NOTE — Progress Notes (Signed)
Carelink Summary Report / Loop Recorder 

## 2018-11-22 ENCOUNTER — Encounter: Payer: Self-pay | Admitting: Neurology

## 2018-11-22 ENCOUNTER — Ambulatory Visit (INDEPENDENT_AMBULATORY_CARE_PROVIDER_SITE_OTHER): Payer: PPO | Admitting: Neurology

## 2018-11-22 ENCOUNTER — Other Ambulatory Visit: Payer: Self-pay

## 2018-11-22 VITALS — BP 128/74 | Ht 70.0 in | Wt 183.0 lb

## 2018-11-22 DIAGNOSIS — I639 Cerebral infarction, unspecified: Secondary | ICD-10-CM | POA: Diagnosis not present

## 2018-11-22 DIAGNOSIS — G629 Polyneuropathy, unspecified: Secondary | ICD-10-CM | POA: Diagnosis not present

## 2018-11-22 MED ORDER — GABAPENTIN 300 MG PO CAPS: ORAL_CAPSULE | ORAL | 3 refills | Status: DC

## 2018-11-22 NOTE — Patient Instructions (Signed)
Great seeing you! Continue gabapentin 300mg : Take 1 cap in AM, 3 caps in PM. Continue control of BP, cholesterol, glucose levels. For any sudden change in symptoms, go to ER immediately. Follow-up in 6-8 months, call for any changes.

## 2018-11-22 NOTE — Progress Notes (Signed)
NEUROLOGY FOLLOW UP OFFICE NOTE  Kevin Bass [id number] [birth year]  HISTORY OF PRESENT ILLNESS: I had the pleasure of seeing Kevin Bass in follow-up in the neurology clinic on 11/22/2018. He is alone in the office today. He was last seen 8 months ago. He has a history of recurrent strokes (x3). He has no residual deficits from the last stroke in October 2019 when he presented with right arm weakness/numbness. He continues on Eliquis, Crestor, Zetia, Cozaar, and Metoprolol. BP today 128/74. His main concern is the neuropathy, mostly affecting his right and leg. He underwent decompression of right median and ulnar nerve in October 2019. He reports pain mostly on the right foot, thigh, and arm, at most a 5/10, worse in the evening hours. Pain is not so bad in the mornings, but never 0/10. He states symptoms are unchanged from last visit, he is on gabapentin 340m 1 tab in AM, 3 tabs in PM. He is a little dizzy after taking medication. He has good and bad days, possibly weather-related. Symptoms are better when he is more active. He has tried using an eBankerand nerve stimulator that he feels helps some, he feels the vibration less on his right foot compared to left when using it. He denies any headaches, vision changes, neck/back pain. No falls. Sleep is good.   History on Initial Assessment 10/01/2016: This is a pleasant 78yo RH man with a history of colon cancer, hyperlipidemia, previous mitral valve repair in his usual state of health until 09/11/16 after taking a walk, he saw his brother-in-law and when he started noticing word-finding difficulties. His wife reported that his speech was not necessarily slurred but he was having trouble finding his words. When symptoms were unchanged the next day, he decided to go to the ER. He was still noted to have mild word-finding difficulties. There was no focal numbness/tingling/weakness. He denied any headaches, dizziness, diplopia,  dysarthria/dysphagia. I personally reviewed MRI brain without contrast which showed an acute infarct in the left posterior insula and parietal operculum. MRA head and carotid dopplers did not show any significant stenosis. Echocardiogram showed EF 60-65%, mild LVH, prior MV repair without significant stenosis or regurgitation, normal LA size. He was switched from aspirin to Plavix. LDL was 124, HbA1c was 5.6. He was unable to get a TEE in the hospital, and has seen Dr. HDebara Pickettand agreed to having the procedure done on 8/14 followed by loop recorder. He denies any palpitations, chest pain, shortness of breath. No neck/back pain, bowel/bladder dysfunction. No falls. He denies any side effect on Plavix. He was started on Crestor yesterday. He feels he is 95% or more back to baseline, he denies any confusion or focal symptoms. He reports a history of facial pain on the right upper lip radiating up his cheek with good response to gabapentin. He takes 307mqhs with no side effects.  Update 12/07/16: He had no residual deficits from prior stroke, then on 11/29/16 he had sudden onset vertigo that made him fall backward. This lasted for 15 seconds and was followed by a mild headache. He called triage and was instructed to go seek medical attention the next day. He went to the ER and was found to have a right PICA territory stroke. He was evaluated by Neurology with no focal deficits seen. He reports he was taking aspirin and Plavix when the stroke occurred. He had an extensive workup for the prior stroke, he had a TEE in August 2018 which  showed normal left atrium, no thrombus seen. He had a 48-hour monitor which was unremarkable. In the hospital, he had a CTA of the head and neck which did not show any occlusion or stenosis. Repeat TTE showed EF of 55-60%, left atrium mildly dilated. He had a CT chest/abdomen/pelvis which did not show any evidence of malignancy. On hospital discharge, he was instructed to continue aspirin  and Plavix and stop his antihypertensives. He had a loop recorder placed.  Update 12/06/2017: He underwent right median nerve and ulnar nerve decompression on 11/28/17. He had stopped the Eliquis for 2 days for the procedure, and restarted Eliquis on 11/29/17. On 12/02/17, he took a nap and woke up with right arm weakness and numbness. He could not lift his right arm up. After 20 minutes, he started getting more feeling in his arm. Per records, he stood up and noticed his gait was off. The weakness improved after several minutes, but he continued to feel off balance. He went to the ER where SBP was elevated at 170. NIH 0 in the hospital. Records were reviewed, he had an MRI brain without contrast showing an acute infarct in the left hippocampus, occlusion of the left PCA which could be acute. There were chronic infarcts in the right cerebellum and left parietal operculum. He had an echo which showed an EF of 55-60%, left atrium normal, right atrium moderately dilated. No significant stenosis on carotid dopplers. LDL in August 2019 was 120, he was advised to increase Crestor to 77m daily. He was having muscle pains and stopped statin for 5 months, he was restarted Crestor with Zetia last August.  He was discharged home 2 days ago and reports that this is the worst of his strokes. It has affected his memory more than anything. He has also noticed vision changes on the right visual field, he describes blurred/double vision reading on his right side. He feels his strength is back but his balance is still off. He also has left foot pain. He has been dealing with his wife and sister's recent deaths.   Diagnostic Data: Neuropathy labs were normal (TSH, B12, ESR, folate, SPEP/IFE). He had an EMG/NCV of the right UE and LE which showed right ulnar neuropathy with slowing across the elbow, axon loss and demyelinating in type, mild to moderate in degree; right median neuropathy at or distal to the wrist, consistent with  carpal tunnel, mild in degree, and probable L5 radiculopathy, mild. No evidence of a large fiber sensorimotor polyneuropathy.    PAST MEDICAL HISTORY: Past Medical History:  Diagnosis Date   Allergy    Anxiety    Arthritis    hands   [place]    [place]    s/p partial colectomy 1990, Cscope neg 7-09, next 2014   DISORDER, MITRAL VALVE    [facility]still needs ABX for SBE prophylaxis    Elevated PSA    prostate nodule, Dr BAlinda Money  GERD (gastroesophageal reflux disease)    Heart murmur    no problems since surgery   HYPERGLYCEMIA, BORDERLINE 10/16/2008   HYPERLIPIDEMIA 06/15/2006   diet controlled   Hypertension    HYPERTROPHY PROSTATE W/O UR OBST & OTH LUTS 10/16/2008   Neuropathy    Right groin pain    Dr. WRedmond Pulling  SCC (squamous cell carcinoma) 06/2017   s/p Mohs---Dr BMilana Na  St[facility]    Wears partial dentures    lower partial    MEDICATIONS:  Current Outpatient Medications on File Prior to Visit  Medication Sig Dispense Refill   ALPRAZolam (XANAX) 0.25 MG tablet TAKE ONE OR TWO TABLETS BY MOUTH TWICE DAILY AS NEEDED FOR ANXIETY 40 tablet 1   Arginine 500 MG CAPS Take 1,500 mg by mouth daily.     ascorbic acid (VITAMIN C) 500 MG/5ML syrup Take 1,000 mg by mouth 2 (two) times daily.     ELIQUIS 5 MG TABS tablet TAKE 1 TABLET BY MOUTH TWICE A DAY 60 tablet 4   ezetimibe (ZETIA) 10 MG tablet Take 1 tablet (10 mg total) by mouth daily. 90 tablet 2   gabapentin (NEURONTIN) 300 MG capsule Take 1 capsules (346m) in the Am and 3 capsules (9051m in the evening     Glucosamine-MSM-Hyaluronic Acd (JOINT HEALTH PO) Take 1 tablet by mouth daily. JARROW JOINT BUILDER     loratadine (CLARITIN) 10 MG tablet Take 10 mg by mouth at bedtime.      losartan (COZAAR) 25 MG tablet Take 1 tablet (25 mg total) by mouth 2 (two) times daily. 180 tablet 1   LYCOPENE PO Take 1 capsule by mouth 2 (two) times daily.  JARROW FORMULA LYCO-SORB PROSTATE SUPPORT      metoprolol succinate (TOPROL-XL) 25 MG 24 hr tablet Take 3 tablets (75 mg total) by mouth daily. (Patient taking differently: Take 25 mg by mouth daily. ) 270 tablet 3   Multiple Vitamins-Minerals (OCUVITE PRESERVISION PO) Take 1 tablet by mouth 2 (two) times daily.      Omega-3 Fatty Acids (FISH OIL PO) Take 1,000 mg by mouth daily.      omeprazole (PRILOSEC) 40 MG capsule Take 1 capsule (40 mg total) by mouth daily. 90 capsule 3   rosuvastatin (CRESTOR) 40 MG tablet Take 1 tablet (40 mg total) by mouth daily. 90 tablet 1   No current facility-administered medications on file prior to visit.     ALLERGIES: Allergies  Allergen Reactions   Statins Other (See Comments)    Myalgias (intolerance)    FAMILY HISTORY: Family History  Problem Relation Age of Onset   Colon cancer Other        M side (cousins)   Diabetes Neg Hx    Stroke Neg Hx    CAD Neg Hx    Prostate cancer Neg Hx    Rectal cancer Neg Hx    Esophageal cancer Neg Hx     SOCIAL HISTORY: Social History   Socioeconomic History   Marital status: Widowed    Spouse name: suzanne   Number of children: 2   Years of education: college   Highest education level: Not on file  Occupational History   Occupation: retired   SoScientist, product/process developmenttrain: Not on file   Food insecurity    Worry: Not on file    Inability: Not on fiLexicographereeds    Medical: Not on file    Non-medical: Not on file  Tobacco Use   Smoking status: Former Smoker    Packs/day: 0.50    Years: 20.00    Pack years: 10.00    Types: Cigarettes    Quit date: 02/23/1983    Years since quitting: 35.7   Smokeless tobacco: Never Used  Substance and Sexual Activity   Alcohol use: Yes    Alcohol/week: 7.0 standard drinks    Types: 7 Glasses of wine per week   Drug use: No   Sexual activity: Not Currently  Lifestyle  Physical activity    Days per week:  Not on file    Minutes per session: Not on file   Stress: Not on file  Relationships   Social connections    Talks on phone: Not on file    Gets together: Not on file    Attends religious service: Not on file    Active member of club or organization: Not on file    Attends meetings of clubs or organizations: Not on file    Relationship status: Not on file   Intimate partner violence    Fear of current or ex partner: Not on file    Emotionally abused: Not on file    Physically abused: Not on file    Forced sexual activity: Not on file  Other Topics Concern   Not on file  Social History Narrative   From [place], lost wife 08/2017 to cancer   daughter in [place], son in [place]: Constitutional: No fevers, chills, or sweats, no generalized fatigue, change in appetite Eyes: No visual changes, double vision, eye pain Ear, nose and throat: No hearing loss, ear pain, nasal congestion, sore throat Cardiovascular: No chest pain, palpitations Respiratory:  No shortness of breath at rest or with exertion, wheezes GastrointestinaI: No nausea, vomiting, diarrhea, abdominal pain, fecal incontinence Genitourinary:  No dysuria, urinary retention or frequency Musculoskeletal:  + neck pain, back pain Integumentary: No rash, pruritus, skin lesions Neurological: as above Psychiatric: + depression, no insomnia, anxiety Endocrine: No palpitations, fatigue, diaphoresis, mood swings, change in appetite, change in weight, increased thirst Hematologic/Lymphatic:  No anemia, purpura, petechiae. Allergic/Immunologic: no itchy/runny eyes, nasal congestion, recent allergic reactions, rashes  PHYSICAL EXAM: Today's Vitals   11/22/18 0858  BP: 128/74  Weight: 183 lb (83 kg)  Height: '5\' 10"'  (1.778 m)   Body mass index is 26.26 kg/m.   General: No acute distress Head:  Normocephalic/atraumatic Skin/Extremities: No rash, no edema Neurological Exam: alert and oriented to person,  place, and time. No aphasia or dysarthria. Fund of knowledge is appropriate.  Recent and remote memory intact. Attention and concentration are normal. Able to name objects and repeat phrases. Cranial nerves: Pupils equal, round, reactive to light.  Extraocular movements intact with no nystagmus. Visual fields intact. No facial asymmetry. Tongue, uvula, palate midline.  Motor: Bulk and tone normal, muscle strength 5/5 throughout, no pronator drift.  Sensation: intact to all modalities on both UE, decreased cold on right LE, intact to pin. Deep tendon reflexes +1 right UE, +2 left UE and bilateral patellar reflexes, absent ankle jerks bilaterally (similar to prior).Finger to nose testing intact.  Gait narrow-based and steady, able to tandem walk adequately.  Romberg slight sway  IMPRESSION: This is a pleasant 78 yo RH man with a history of colon cancer, hyperlipidemia, previous mitral valve repair with recurrent strokes (x3) secondary to atrial fibrillation, no residual deficits. Continue Eliquis and control of vascular risk factors. He continues to report neuropathy, mostly affecting his right side. We discussed options for neuropathic pain, he would like to stay on gabapentin 320m 1 tab in AM, 3 tabs in PM and will call if he would like to try increasing dose. He knows to go to the ER for any sudden change in symptoms. Follow-up in 6-8 months, he knows to call for any changes.   Thank you for allowing me to participate in his care.  Please do not hesitate to call for any questions or concerns.  The duration of this appointment visit was 30 minutes of face-to-face time with the patient.  Greater than 50% of this time was spent in counseling, explanation of diagnosis, planning of further management, and coordination of care.   Ellouise Newer, M.D.   CC: Dr. [name]

## 2018-11-24 ENCOUNTER — Other Ambulatory Visit: Payer: Self-pay | Admitting: Internal Medicine

## 2018-11-27 ENCOUNTER — Other Ambulatory Visit: Payer: Self-pay | Admitting: Internal Medicine

## 2018-11-29 ENCOUNTER — Encounter: Payer: Self-pay | Admitting: Internal Medicine

## 2018-12-04 ENCOUNTER — Telehealth: Payer: Self-pay | Admitting: Podiatry

## 2018-12-04 NOTE — Telephone Encounter (Signed)
DATE OF OFFICE SURGERY: 12/13/2018 SURGICAL PROCEDURE: Exostectomy 5th Lt CPT CODE: 41597 DX CODE: M25.70 Dr. Paulla Dolly is performing the office surgery.  [employer]   Auth# [id number]  Valid from 11/30/2018 - 02/28/2019  Authorization received via fax 12/01/2018.

## 2018-12-08 ENCOUNTER — Other Ambulatory Visit: Payer: Self-pay

## 2018-12-11 ENCOUNTER — Other Ambulatory Visit: Payer: Self-pay

## 2018-12-11 ENCOUNTER — Encounter: Payer: Self-pay | Admitting: Internal Medicine

## 2018-12-11 ENCOUNTER — Telehealth: Payer: Self-pay | Admitting: Podiatry

## 2018-12-11 ENCOUNTER — Ambulatory Visit (INDEPENDENT_AMBULATORY_CARE_PROVIDER_SITE_OTHER): Payer: PPO | Admitting: Internal Medicine

## 2018-12-11 VITALS — BP 130/73 | HR 61 | Temp 97.7°F | Resp 16 | Ht 70.0 in | Wt 186.0 lb

## 2018-12-11 DIAGNOSIS — F419 Anxiety disorder, unspecified: Secondary | ICD-10-CM | POA: Diagnosis not present

## 2018-12-11 DIAGNOSIS — E785 Hyperlipidemia, unspecified: Secondary | ICD-10-CM | POA: Diagnosis not present

## 2018-12-11 DIAGNOSIS — Z0000 Encounter for general adult medical examination without abnormal findings: Secondary | ICD-10-CM

## 2018-12-11 DIAGNOSIS — F32A Depression, unspecified: Secondary | ICD-10-CM

## 2018-12-11 DIAGNOSIS — R739 Hyperglycemia, unspecified: Secondary | ICD-10-CM

## 2018-12-11 DIAGNOSIS — F329 Major depressive disorder, single episode, unspecified: Secondary | ICD-10-CM | POA: Diagnosis not present

## 2018-12-11 DIAGNOSIS — Z79899 Other long term (current) drug therapy: Secondary | ICD-10-CM | POA: Diagnosis not present

## 2018-12-11 LAB — LIPID PANEL
Cholesterol: 91 mg/dL (ref 0–200)
HDL: 40.3 mg/dL (ref >39.00)
LDL Cholesterol: 26 mg/dL (ref 0–99)
NonHDL: 51.05
Total CHOL/HDL Ratio: 2
Triglycerides: 127 mg/dL (ref 0.0–149.0)
VLDL: 25.4 mg/dL (ref 0.0–40.0)

## 2018-12-11 LAB — HEMOGLOBIN A1C: Hgb A1c MFr Bld: 5.9 % (ref 4.6–6.5)

## 2018-12-11 NOTE — Progress Notes (Signed)
Subjective:    Patient ID: Kevin Bass, male    DOB: 05/31/1940, 78 y.o.   MRN: [id number]  DOS:  12/11/2018 Type of visit - description: CPX Notes from urology reviewed. He is doing well BPs at home are lately okay. He has neuropathy, here lately the sxs increased, mostly burning on the R upper and R lower extremity.  No associated strokelike symptoms such as slurred speech, motor deficits.  He is taking extra gabapentin with some help.  Review of Systems  Other than above, a 14 point review of systems is negative     Past Medical History:  Diagnosis Date  . Allergy   . Anxiety   . Arthritis    hands  . Atrial fibrillation ([place])   . Colon cancer [facility])    s/p partial colectomy 1990, Cscope neg 7-09, next 2014  . DISORDER, MITRAL VALVE    MV recontruction at [place] 2006----still needs ABX for SBE prophylaxis   . Elevated PSA    prostate nodule, [name]  . GERD (gastroesophageal reflux disease)   . Heart murmur    no problems since surgery  . HYPERGLYCEMIA, BORDERLINE 10/16/2008  . HYPERLIPIDEMIA 06/15/2006   diet controlled  . Hypertension   . HYPERTROPHY PROSTATE W/O UR OBST & OTH LUTS 10/16/2008  . Neuropathy   . Right groin pain    Dr. Redmond Pulling  . SCC (squamous cell carcinoma) 06/2017   s/p Mohs---[name]  . Stroke ([place])   . Wears partial dentures    lower partial    Past Surgical History:  Procedure Laterality Date  . COLECTOMY  1990  . HERNIA REPAIR Left 2014   [facility] rep w/mesh- March 2014  . LOOP RECORDER INSERTION N/A 12/01/2016   Procedure: LOOP RECORDER INSERTION;  Surgeon: Evans Lance, MD;  Location: [facility];  Service: Cardiovascular;  Laterality: N/A;  . MITRAL VALVE ANNULOPLASTY     [facility] 2006  . TEE WITHOUT CARDIOVERSION N/A 10/05/2016   Procedure: TRANSESOPHAGEAL ECHOCARDIOGRAM (TEE);  Surgeon: [name], MD;  Location: [facility] ENDOSCOPY;  Service: Cardiovascular;  Laterality: N/A;   Family History  Problem Relation Age of  Onset  . Colon cancer Other        M side (cousins)  . Diabetes Neg Hx   . Stroke Neg Hx   . CAD Neg Hx   . Prostate cancer Neg Hx   . Rectal cancer Neg Hx   . Esophageal cancer Neg Hx      Social History   Socioeconomic History  . Marital status: Widowed    Spouse name: suzanne  . Number of children: 2  . Years of education: college  . Highest education level: Not on file  Occupational History  . Occupation: retired   Scientific laboratory technician  . Financial resource strain: Not on file  . Food insecurity    Worry: Not on file    Inability: Not on file  . Transportation needs    Medical: Not on file    Non-medical: Not on file  Tobacco Use  . Smoking status: Former Smoker    Packs/day: 0.50    Years: 20.00    Pack years: 10.00    Types: Cigarettes    Quit date: 02/23/1983    Years since quitting: 35.8  . Smokeless tobacco: Never Used  Substance and Sexual Activity  . Alcohol use: Not Currently    Alcohol/week: 7.0 standard drinks    Types: 7 Glasses of wine per  week  . Drug use: No  . Sexual activity: Not Currently  Lifestyle  . Physical activity    Days per week: Not on file    Minutes per session: Not on file  . Stress: Not on file  Relationships  . Social Herbalist on phone: Not on file    Gets together: Not on file    Attends religious service: Not on file    Active member of club or organization: Not on file    Attends meetings of clubs or organizations: Not on file    Relationship status: Not on file  . Intimate partner violence    Fear of current or ex partner: Not on file    Emotionally abused: Not on file    Physically abused: Not on file    Forced sexual activity: Not on file  Other Topics Concern  . Not on file  Social History Narrative   From [place], lost wife 08/2017 to cancer   Daughter in [place], son in [place]   Right handed         Allergies as of 12/11/2018      Reactions   Statins Other (See Comments)   Myalgias (intolerance)       Medication List       Accurate as of December 11, 2018 11:59 PM. If you have any questions, ask your nurse or doctor.        ALPRAZolam 0.25 MG tablet Commonly known as: XANAX TAKE ONE OR TWO TABLETS BY MOUTH TWICE DAILY AS NEEDED FOR ANXIETY   Arginine 500 MG Caps Take 1,500 mg by mouth daily.   ascorbic acid 500 MG/5ML syrup Commonly known as: VITAMIN C Take 1,000 mg by mouth 2 (two) times daily.   Eliquis 5 MG Tabs tablet Generic drug: apixaban TAKE 1 TABLET BY MOUTH TWICE A DAY   ezetimibe 10 MG tablet Commonly known as: ZETIA Take 1 tablet (10 mg total) by mouth daily.   FISH OIL PO Take 1,000 mg by mouth daily.   gabapentin 300 MG capsule Commonly known as: NEURONTIN Take 2 capsules (300mg ) in the AM and 3 capsules (900mg ) in the evening What changed: additional instructions Changed by: Kathlene November, MD   JOINT HEALTH PO Take 1 tablet by mouth daily. JARROW JOINT BUILDER   loratadine 10 MG tablet Commonly known as: CLARITIN Take 10 mg by mouth at bedtime.   losartan 25 MG tablet Commonly known as: COZAAR Take 1 tablet (25 mg total) by mouth 2 (two) times daily.   LYCOPENE PO Take 1 capsule by mouth 2 (two) times daily. JARROW FORMULA LYCO-SORB PROSTATE SUPPORT   metoprolol succinate 25 MG 24 hr tablet Commonly known as: TOPROL-XL Take 3 tablets (75 mg total) by mouth daily. What changed: how much to take   OCUVITE PRESERVISION PO Take 1 tablet by mouth 2 (two) times daily.   omeprazole 40 MG capsule Commonly known as: PRILOSEC Take 1 capsule (40 mg total) by mouth daily.   rosuvastatin 40 MG tablet Commonly known as: CRESTOR Take 1 tablet (40 mg total) by mouth daily.           Objective:   Physical Exam BP 130/73 (BP Location: Right Arm, Patient Position: Sitting, Cuff Size: Small)   Pulse 61   Temp 97.7 F (36.5 C) (Temporal)   Resp 16   Ht 5\' 10"  (1.778 m)   Wt 186 lb (84.4 kg)   SpO2 98%   BMI 26.69  kg/m  General: Well  developed, NAD, BMI noted Neck: No  thyromegaly  HEENT:  Normocephalic . Face symmetric, atraumatic Lungs:  CTA B Normal respiratory effort, no intercostal retractions, no accessory muscle use. Heart: RRR,  no murmur.  No pretibial edema bilaterally  Abdomen:  Not distended, soft, non-tender. No rebound or rigidity.   Skin: Exposed areas without rash. Not pale. Not jaundice Neurologic:  alert & oriented X3.  Speech normal, gait appropriate for age and unassisted Strength symmetric and appropriate for age.  Psych: Cognition and judgment appear intact.  Cooperative with normal attention span and concentration.  Behavior appropriate. No anxious or depressed appearing.     Assessment     Assessment Prediabetes HTN Hyperlipidemia, statin intolerant (simva-pravachol: shoulder pain) Anxiety- rarely takes xanax Neuro  --Facial neuropathy dx 2015, no w/u, sx resurface x1 after temporary d/c of meds  --saw neuro again 02/2017, labs (-), had a NCS, Rx gaba CV: --Mitral valve annuloplasty 2006-- needs SBE prophylaxis -- SOB 04-2016, [facility], had a ECHO Nl fx MV, cath: no CAD --Cryptogenic stroke 09/12/2016, had slurred speech. 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 [name] Colon cancer partial colectomy 1990, colonoscopy 08-2007 negative, Cscope again 09-2012 normal, 5 years  SCC . MOHS 06/2017  PLAN:   Prediabetes: Check A1c HTN: See last pt's  message, he actually decided to take only 2 losartan, he does take 3 metoprolols, ambulatory BPs okay.  Last BMP satisfactory.  No change Hyperlipidemia: On Crestor, Zetia, last LFTs normal, check a FLP Anxiety: On Xanax, take it no more than once daily.  Check a UDS, doing well emotionally. RTC 6 months

## 2018-12-11 NOTE — Telephone Encounter (Signed)
I told pt that if the surgery was on the left foot he could drive, he would not have any anesthesia to make him disoriented. Pt states understanding.

## 2018-12-11 NOTE — Patient Instructions (Addendum)
Please schedule Medicare Wellness with Glenard Haring.   \ GO TO THE LAB : Get the blood work     [place] Schedule your next appointment   for a checkup in 6 months    Please go to [place] and see if you need second Shingrix shot  Check the  blood pressure 2 or 3 times a week BP GOAL is between 110/65 and  135/85. If it is consistently higher or lower, let me know

## 2018-12-11 NOTE — Progress Notes (Signed)
Pre visit review using our clinic review tool, if applicable. No additional management support is needed unless otherwise documented below in the visit note. 

## 2018-12-11 NOTE — Telephone Encounter (Signed)
I'm having a small surgery on the toe of my left foot Wednesday morning at 7:45. I just wanted to make sure that I can drive myself to this or do I have to have someone drive me or whatever. Please call me back. Thank you.

## 2018-12-12 LAB — PAIN MGMT, PROFILE 8 W/CONF, U
6 Acetylmorphine: NEGATIVE ng/mL
Alcohol Metabolites: NEGATIVE ng/mL (ref <500)
Amphetamines: NEGATIVE ng/mL
Benzodiazepines: NEGATIVE ng/mL
Buprenorphine, Urine: NEGATIVE ng/mL
Cocaine Metabolite: NEGATIVE ng/mL
Creatinine: 146.2 mg/dL
MDMA: NEGATIVE ng/mL
Marijuana Metabolite: NEGATIVE ng/mL
Opiates: NEGATIVE ng/mL
Oxidant: NEGATIVE ug/mL
Oxycodone: NEGATIVE ng/mL
pH: 5.7 (ref 4.5–9.0)

## 2018-12-12 NOTE — Assessment & Plan Note (Signed)
Prediabetes: Check A1c HTN: See last pt's  message, he actually decided to take only 2 losartan, he does take 3 metoprolols, ambulatory BPs okay.  Last BMP satisfactory.  No change Hyperlipidemia: On Crestor, Zetia, last LFTs normal, check a FLP Anxiety: On Xanax, take it no more than once daily.  Check a UDS, doing well emotionally. RTC 6 months

## 2018-12-12 NOTE — Assessment & Plan Note (Signed)
-  Td 2017 per pt - Pneumonia shot 2006, 2012 -  prevnar--2015   - S/p Zostavax per pt , no documentation - S/p shingrix #1 at [place]  01/2017; rec to get the 2nd dose - had a flu shot  -h/o colon ca: Last Cscope-- 09-2012 neg, due for a colonoscopy, he is reluctant to proceed due to his multiple medical problems.  He understand the rationale behind redoing a colonoscopy since he is high risk to develop polyps or even another cancer.  He will call me if he changes his mind. -Has a prostate nodule, last urology visit 12-2017, felt to be stable.  Follow-up rx in 1 year  -Labs reviewed: Check FLP, A1c, UDS

## 2018-12-13 ENCOUNTER — Other Ambulatory Visit: Payer: Self-pay

## 2018-12-13 ENCOUNTER — Ambulatory Visit (INDEPENDENT_AMBULATORY_CARE_PROVIDER_SITE_OTHER): Payer: PPO | Admitting: Podiatry

## 2018-12-13 ENCOUNTER — Encounter: Payer: Self-pay | Admitting: Podiatry

## 2018-12-13 VITALS — BP 161/82 | HR 57 | Temp 93.3°F | Resp 16

## 2018-12-13 DIAGNOSIS — D169 Benign neoplasm of bone and articular cartilage, unspecified: Secondary | ICD-10-CM | POA: Diagnosis not present

## 2018-12-13 NOTE — Progress Notes (Signed)
Subjective:   Patient ID: Kevin Bass, male   DOB: 78 y.o.   MRN: [id number]   HPI Patient presents stating I am here to get this spur off my toe fixed its been very tender   ROS      Objective:  Physical Exam  Neurovascular status intact negative Bevelyn Buckles' sign noted with patient's left fifth digit showing keratotic lesion is painful when pressed and difficult to wear shoe gear with     Assessment:  Chronic exostotic lesion digit 5 left with pain     Plan:  Patient anesthetized with 100 mg Alekh Marcaine mixture and taken to the OR and sterile prep done and then tourniquet inflated 250 mmHg.  Attention was directed medial aspect digit 5 left 4 semielliptical linear incision was made over the offending spur.  Incision was deepened through subcutaneous tissue down to bone and the intervening skin wedge was removed in toto.  Utilizing a sidecutting bur we removed the spur on the fifth digit left creating bone paste which was removed and I found her to be adequate removal of bone.  I then flushed with copious amounts Terramycin solution and sutured with 5-0 nylon and applied sterile dressing tourniquet released capillary fill noted to be immediate to all digits on the toe.  Patient tolerated surgery and anesthesia well and was transferred out of recovery in satisfactory condition

## 2018-12-27 ENCOUNTER — Encounter: Payer: Self-pay | Admitting: Podiatry

## 2018-12-27 ENCOUNTER — Encounter: Payer: PPO | Admitting: Podiatry

## 2018-12-27 ENCOUNTER — Other Ambulatory Visit: Payer: Self-pay

## 2018-12-27 ENCOUNTER — Ambulatory Visit (INDEPENDENT_AMBULATORY_CARE_PROVIDER_SITE_OTHER): Payer: PPO

## 2018-12-27 ENCOUNTER — Ambulatory Visit (INDEPENDENT_AMBULATORY_CARE_PROVIDER_SITE_OTHER): Payer: Self-pay | Admitting: Podiatry

## 2018-12-27 VITALS — Temp 98.5°F

## 2018-12-27 DIAGNOSIS — Z09 Encounter for follow-up examination after completed treatment for conditions other than malignant neoplasm: Secondary | ICD-10-CM | POA: Diagnosis not present

## 2018-12-27 DIAGNOSIS — D169 Benign neoplasm of bone and articular cartilage, unspecified: Secondary | ICD-10-CM

## 2018-12-27 NOTE — Progress Notes (Signed)
Subjective:   Patient ID: Kevin Bass, male   DOB: 78 y.o.   MRN: [id number]   HPI Patient presents stating I am doing well with minimal discomfort and slight irritation not where the surgery was done but the outside of the toe   ROS      Objective:  Physical Exam  Neurovascular status intact negative Bevelyn Buckles' sign noted with patient's fifth digit healing well wound edges well coapted stitches in place     Assessment:  Doing well post exostectomy fifth digit left     Plan:  Stitches are removed wound edges coapted well and patient may gradual return to soft shoe and if any issues were to occur he is to let us know immediately  X-rays indicate satisfactory resection of bone

## 2019-01-04 ENCOUNTER — Other Ambulatory Visit: Payer: Self-pay | Admitting: Internal Medicine

## 2019-01-05 ENCOUNTER — Other Ambulatory Visit: Payer: Self-pay | Admitting: Internal Medicine

## 2019-01-10 ENCOUNTER — Encounter: Payer: PPO | Admitting: Podiatry

## 2019-01-12 DIAGNOSIS — H53461 Homonymous bilateral field defects, right side: Secondary | ICD-10-CM | POA: Diagnosis not present

## 2019-01-12 DIAGNOSIS — H524 Presbyopia: Secondary | ICD-10-CM | POA: Diagnosis not present

## 2019-01-12 DIAGNOSIS — H5711 Ocular pain, right eye: Secondary | ICD-10-CM | POA: Diagnosis not present

## 2019-01-12 DIAGNOSIS — H52223 Regular astigmatism, bilateral: Secondary | ICD-10-CM | POA: Diagnosis not present

## 2019-01-12 DIAGNOSIS — H2513 Age-related nuclear cataract, bilateral: Secondary | ICD-10-CM | POA: Diagnosis not present

## 2019-01-12 DIAGNOSIS — H43821 Vitreomacular adhesion, right eye: Secondary | ICD-10-CM | POA: Diagnosis not present

## 2019-01-12 DIAGNOSIS — I69398 Other sequelae of cerebral infarction: Secondary | ICD-10-CM | POA: Diagnosis not present

## 2019-01-12 DIAGNOSIS — H43812 Vitreous degeneration, left eye: Secondary | ICD-10-CM | POA: Diagnosis not present

## 2019-01-12 DIAGNOSIS — H35362 Drusen (degenerative) of macula, left eye: Secondary | ICD-10-CM | POA: Diagnosis not present

## 2019-01-12 DIAGNOSIS — H5203 Hypermetropia, bilateral: Secondary | ICD-10-CM | POA: Diagnosis not present

## 2019-01-29 ENCOUNTER — Ambulatory Visit (INDEPENDENT_AMBULATORY_CARE_PROVIDER_SITE_OTHER): Payer: PPO | Admitting: *Deleted

## 2019-01-29 DIAGNOSIS — I639 Cerebral infarction, unspecified: Secondary | ICD-10-CM

## 2019-01-30 LAB — CUP PACEART REMOTE DEVICE CHECK
Date Time Interrogation Session: 20201208073331
Implantable Pulse Generator Implant Date: 20181010

## 2019-02-08 ENCOUNTER — Encounter: Payer: Self-pay | Admitting: Internal Medicine

## 2019-02-12 ENCOUNTER — Encounter: Payer: Self-pay | Admitting: Internal Medicine

## 2019-02-12 ENCOUNTER — Other Ambulatory Visit: Payer: Self-pay | Admitting: Internal Medicine

## 2019-02-12 MED ORDER — AMLODIPINE BESYLATE 5 MG PO TABS: 5.0000 mg | ORAL_TABLET | Freq: Every day | ORAL | 6 refills | Status: DC

## 2019-02-21 ENCOUNTER — Other Ambulatory Visit: Payer: Self-pay | Admitting: Internal Medicine

## 2019-02-22 NOTE — Telephone Encounter (Signed)
Sent!

## 2019-03-07 ENCOUNTER — Other Ambulatory Visit: Payer: Self-pay | Admitting: Internal Medicine

## 2019-03-07 ENCOUNTER — Encounter: Payer: Self-pay | Admitting: Internal Medicine

## 2019-03-07 MED ORDER — LOSARTAN POTASSIUM 100 MG PO TABS: 100.0000 mg | ORAL_TABLET | Freq: Every day | ORAL | 6 refills | Status: DC

## 2019-03-08 ENCOUNTER — Other Ambulatory Visit: Payer: Self-pay | Admitting: *Deleted

## 2019-03-08 DIAGNOSIS — I10 Essential (primary) hypertension: Secondary | ICD-10-CM

## 2019-03-09 IMAGING — CT CT HEAD W/O CM
3 series · 15 of 47 positions shown, 18 images · non-contrast
Comparison: MRI brain 11/30/2016.

CLINICAL DATA: Coldness and numbness in the fingers intermittently.
Right foot and ankle. Focal neuro deficit of greater than 6 hours.
Recent infarcts.

EXAM:
CT HEAD WITHOUT CONTRAST
TECHNIQUE: Contiguous axial images were obtained from the base of the skull
through the vertex without intravenous contrast.

[Series 2: head wo · axial · 0.42mm/px · z∈[-223,-93]mm · 9 of 32 slices shown, 12 images]
[im 3/32  brain]
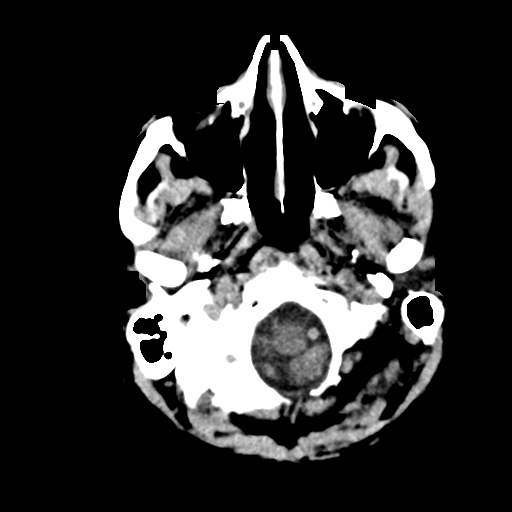
[im 3/32  bone]
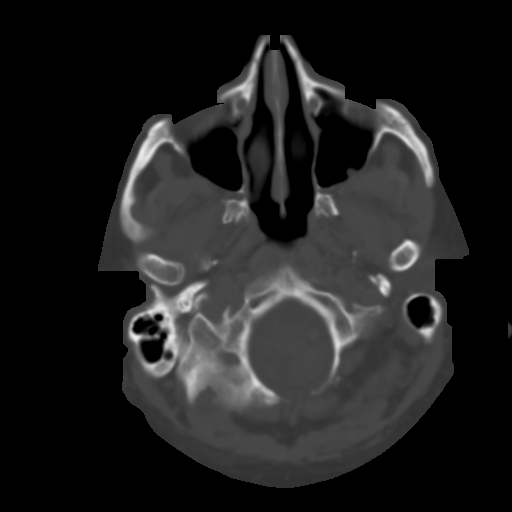
[im 6/32  brain]
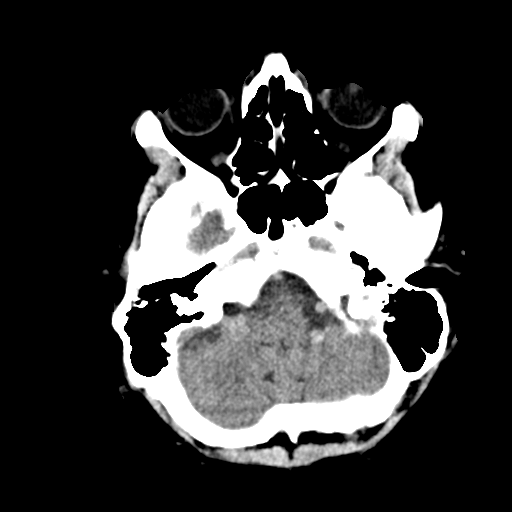
[im 9/32  brain]
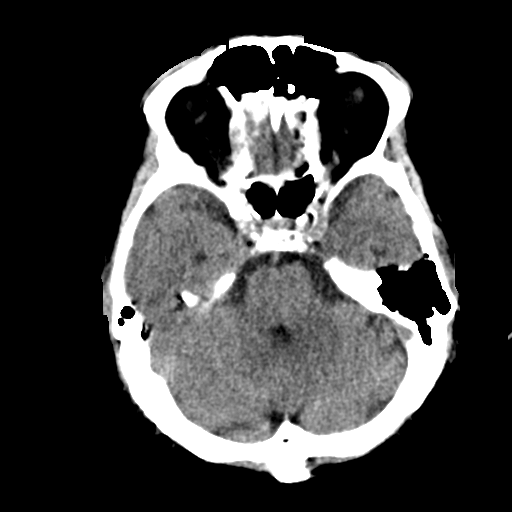
[im 12/32  brain]
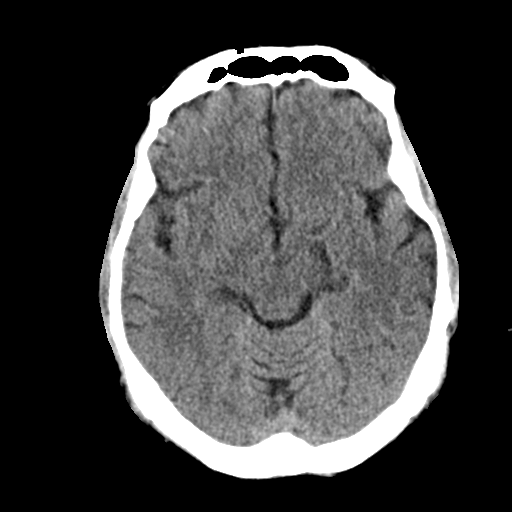
[im 17/32  brain]
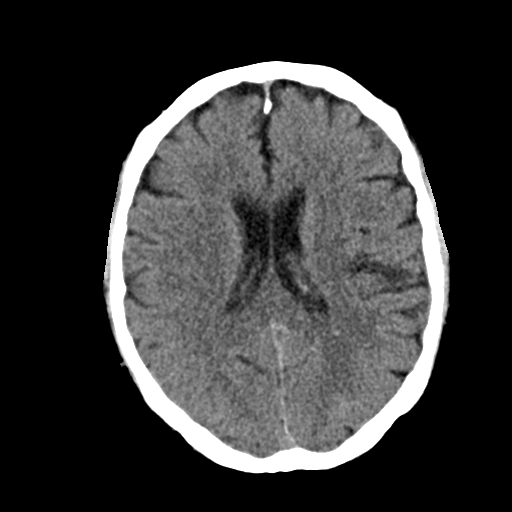
[im 17/32  bone]
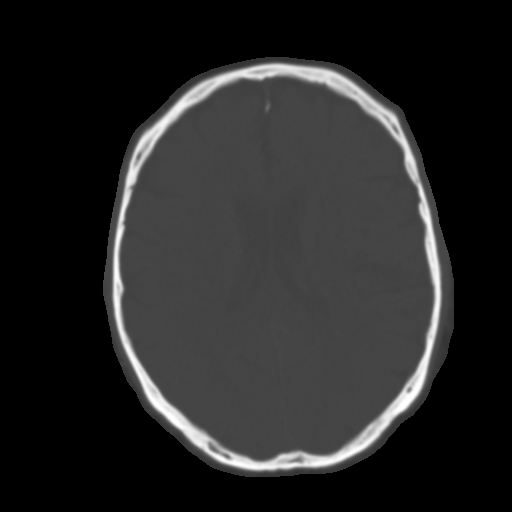
[im 20/32  brain]
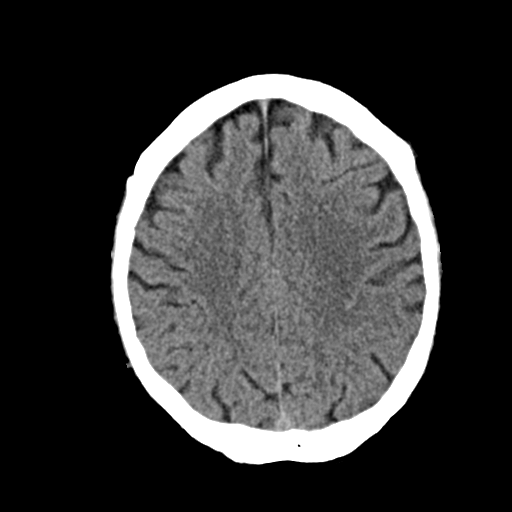
[im 23/32  brain]
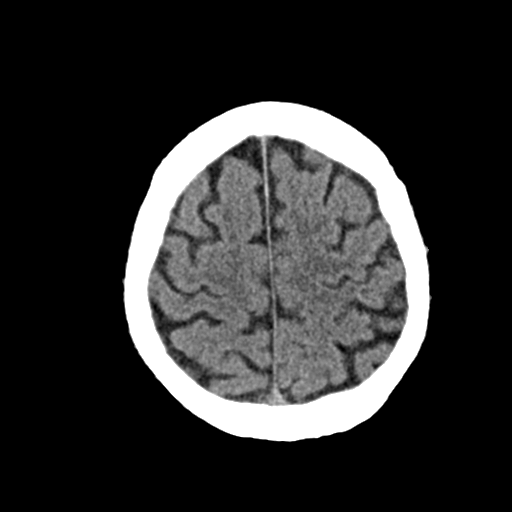
[im 26/32  brain]
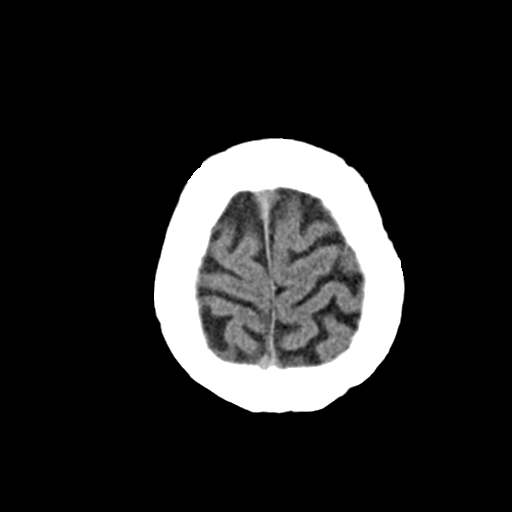
[im 29/32  brain]
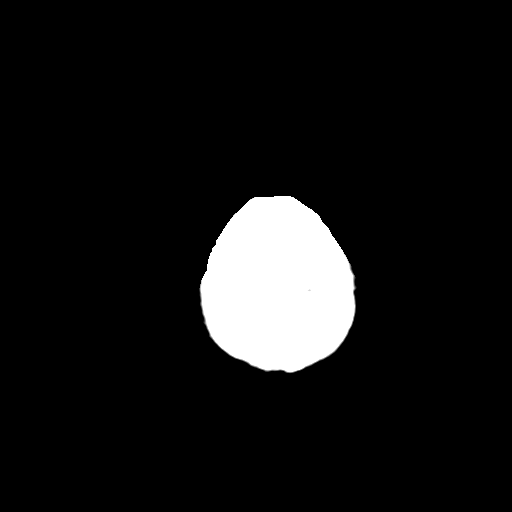
[im 29/32  bone]
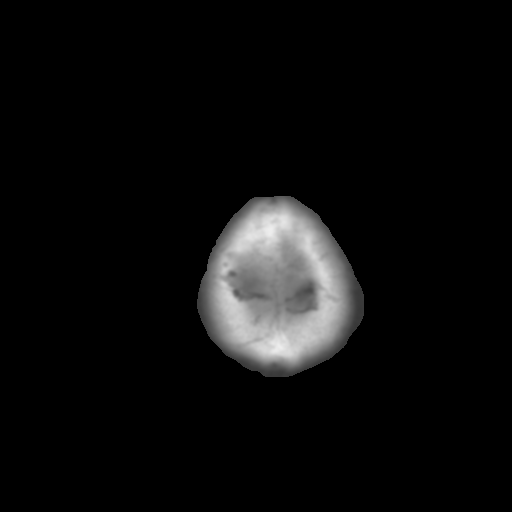

[Series 4: coronal soft · coronal · 0.30mm/px · 3 of 67 slices shown]
[im 23/67  brain]
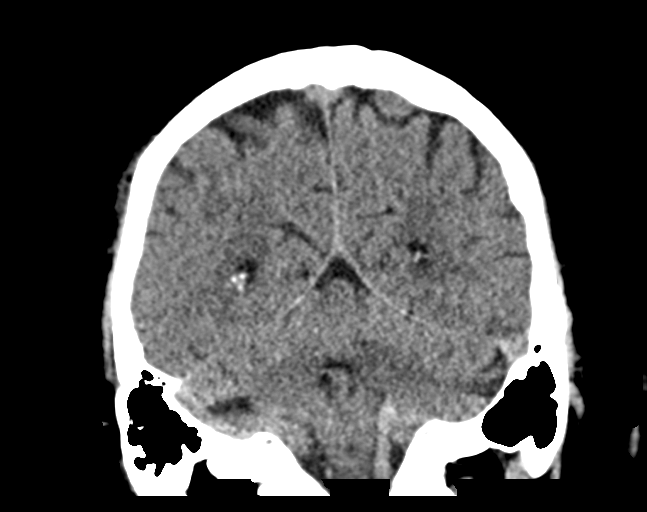
[im 30/67  brain]
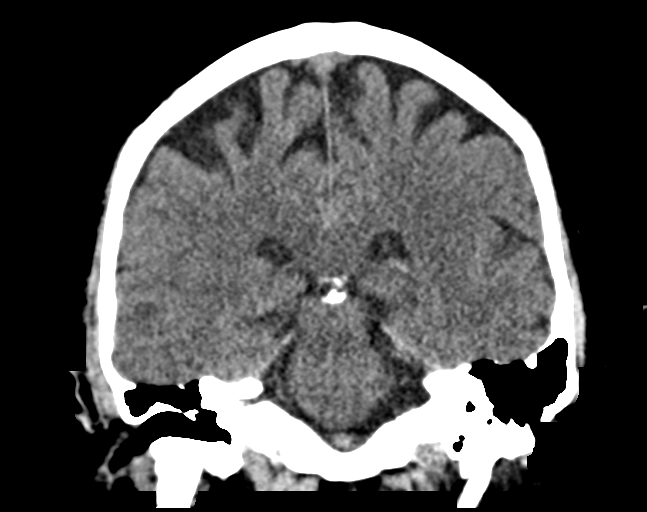
[im 37/67  brain]
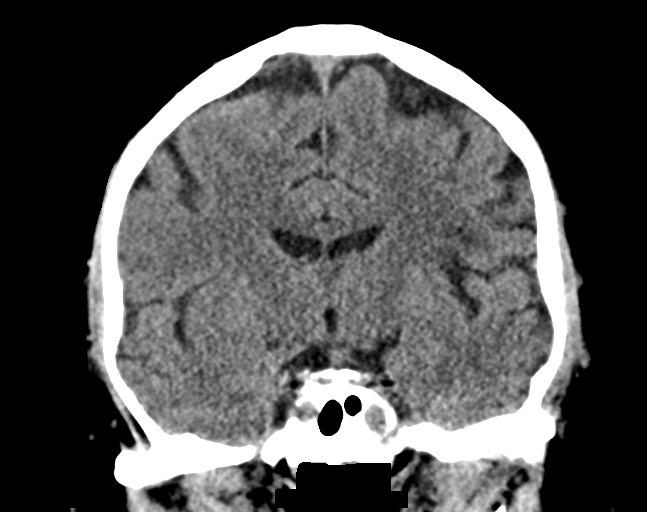

[Series 5: sag soft · sagittal · 0.30mm/px · 3 of 66 slices shown]
[im 22/66  brain]
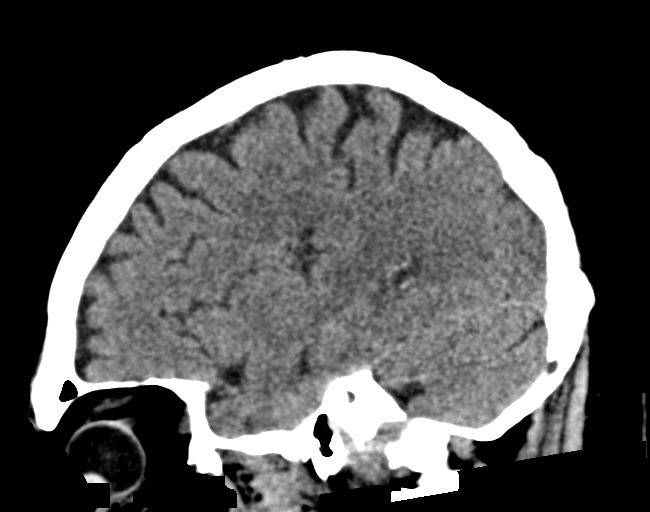
[im 33/66  brain]
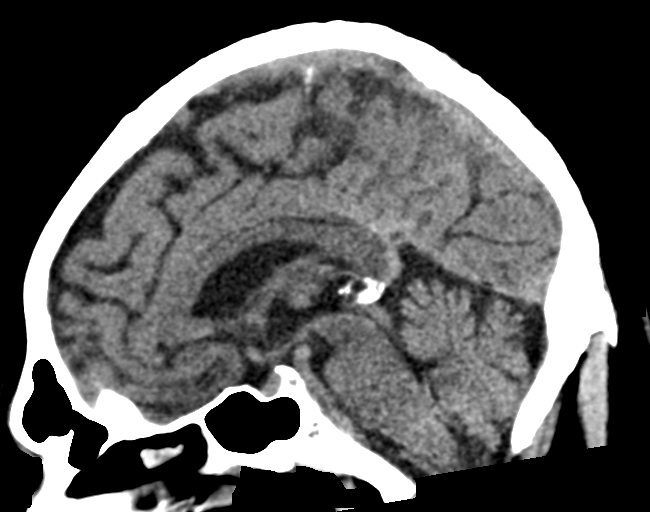
[im 44/66  brain]
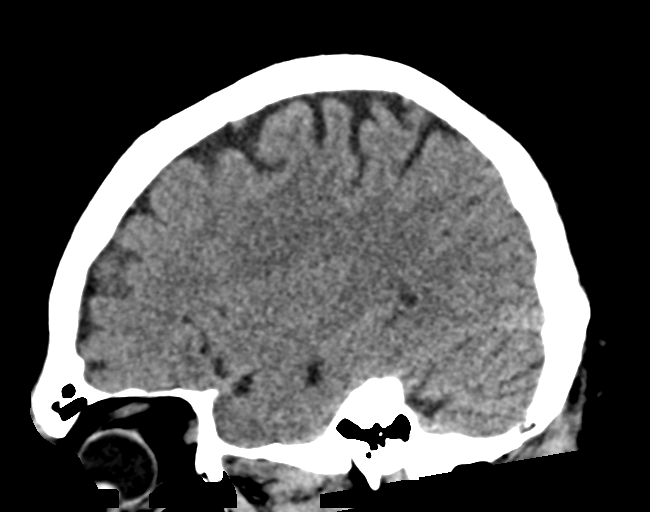

[15 of 47 positions shown; findings below may reference images not displayed]

FINDINGS: Brain: Previous right PICA territory infarct is again noted. No
other acute infarct, hemorrhage, or mass lesion is present. The
ventricles are of normal size. No significant extra-axial fluid
collection is present.

Vascular: Vascular calcifications are present within the cavernous
internal carotid artery is bilaterally. There is no hyperdense
vessel.

Skull: Calvarium is intact. No focal lytic or blastic lesions are
present.

Sinuses/Orbits: Paranasal sinuses and mastoid air cells are clear.
Globes and orbits are within normal limits.
IMPRESSION: 1. No acute abnormality.
2. Remote right PICA territory infarct.

## 2019-03-12 ENCOUNTER — Encounter: Payer: Self-pay | Admitting: Internal Medicine

## 2019-03-12 ENCOUNTER — Other Ambulatory Visit (INDEPENDENT_AMBULATORY_CARE_PROVIDER_SITE_OTHER): Payer: PPO

## 2019-03-12 ENCOUNTER — Other Ambulatory Visit: Payer: Self-pay

## 2019-03-12 DIAGNOSIS — I10 Essential (primary) hypertension: Secondary | ICD-10-CM

## 2019-03-12 LAB — BASIC METABOLIC PANEL
BUN: 22 mg/dL (ref 6–23)
CO2: 31 mEq/L (ref 19–32)
Calcium: 9.3 mg/dL (ref 8.4–10.5)
Chloride: 104 mEq/L (ref 96–112)
Creatinine, Ser: 0.98 mg/dL (ref 0.40–1.50)
GFR: 73.49 mL/min (ref >60.00)
Glucose, Bld: 108 mg/dL — ABNORMAL HIGH (ref 70–99)
Potassium: 4.2 mEq/L (ref 3.5–5.1)
Sodium: 140 mEq/L (ref 135–145)

## 2019-03-13 ENCOUNTER — Encounter: Payer: Self-pay | Admitting: Internal Medicine

## 2019-03-28 ENCOUNTER — Other Ambulatory Visit: Payer: Self-pay

## 2019-03-28 ENCOUNTER — Ambulatory Visit (INDEPENDENT_AMBULATORY_CARE_PROVIDER_SITE_OTHER): Payer: PPO | Admitting: Internal Medicine

## 2019-03-28 ENCOUNTER — Encounter: Payer: Self-pay | Admitting: Internal Medicine

## 2019-03-28 VITALS — BP 124/72 | HR 64 | Ht 70.0 in

## 2019-03-28 DIAGNOSIS — J310 Chronic rhinitis: Secondary | ICD-10-CM

## 2019-03-28 DIAGNOSIS — Z9189 Other specified personal risk factors, not elsewhere classified: Secondary | ICD-10-CM | POA: Diagnosis not present

## 2019-03-28 NOTE — Progress Notes (Signed)
Subjective:    Patient ID: Kevin Bass, male    DOB: [birth year], 79 y.o.   MRN: [id number]  DOS:  03/28/2019 Type of visit - description: Virtual Visit via Video Note  I connected with the above patient  by a video enabled telemedicine application and verified that I am speaking with the correct person using two identifiers.   THIS ENCOUNTER IS A VIRTUAL VISIT DUE TO COVID-19 - PATIENT WAS NOT SEEN IN THE OFFICE. PATIENT HAS CONSENTED TO VIRTUAL VISIT / TELEMEDICINE VISIT   Location of patient: home  Location of provider: office  I discussed the limitations of evaluation and management by telemedicine and the availability of in person appointments. The patient expressed understanding and agreed to proceed.  Acute The patient is concerned because his chronic nasal congestion and postnasal drip has been gotten worse in the last few days. The symptoms again are chronic and happen every winter.  He also has concerns and questions about: -Getting together with family -Covid vaccination.   Review of Systems Has checked his temperature: No fever No chest pain no difficulty breathing No cough No nausea, vomiting, diarrhea. No myalgias  Past Medical History:  Diagnosis Date  . Allergy   . Anxiety   . Arthritis    hands  . Atrial fibrillation ([place])   . Colon cancer [facility])    s/p partial colectomy 1990, Cscope neg 7-09, next 2014  . DISORDER, MITRAL VALVE    MV recontruction at [place] 2006----still needs ABX for SBE prophylaxis   . Elevated PSA    prostate nodule, [name]  . GERD (gastroesophageal reflux disease)   . Heart murmur    no problems since surgery  . HYPERGLYCEMIA, BORDERLINE 10/16/2008  . HYPERLIPIDEMIA 06/15/2006   diet controlled  . Hypertension   . HYPERTROPHY PROSTATE W/O UR OBST & OTH LUTS 10/16/2008  . Neuropathy   . Right groin pain    Dr. Redmond Pulling  . SCC (squamous cell carcinoma) 06/2017   s/p Mohs---[name]  . Stroke ([place])   . Wears partial  dentures    lower partial    Past Surgical History:  Procedure Laterality Date  . COLECTOMY  1990  . HERNIA REPAIR Left 2014   [facility] rep w/mesh- March 2014  . LOOP RECORDER INSERTION N/A 12/01/2016   Procedure: LOOP RECORDER INSERTION;  Surgeon: Evans Lance, MD;  Location: [place] CV LAB;  Service: Cardiovascular;  Laterality: N/A;  . MITRAL VALVE ANNULOPLASTY     [facility] 2006  . TEE WITHOUT CARDIOVERSION N/A 10/05/2016   Procedure: TRANSESOPHAGEAL ECHOCARDIOGRAM (TEE);  Surgeon: [name], MD;  Location: [facility] ENDOSCOPY;  Service: Cardiovascular;  Laterality: N/A;        Objective:   Physical Exam BP 124/72   Pulse 64   Ht 5\' 10"  (1.778 m)   BMI 26.69 kg/m  This is a virtual video visit, he is alert oriented x3, in no apparent distress    Assessment     Assessment Prediabetes HTN Hyperlipidemia, statin intolerant (simva-pravachol: shoulder pain) Anxiety- rarely takes xanax Neuro  --Facial neuropathy dx 2015, no w/u, sx resurface x1 after temporary d/c of meds  --saw neuro again 02/2017, labs (-), had a NCS, Rx gaba CV: --Mitral valve annuloplasty 2006-- needs SBE prophylaxis -- SOB 04-2016, seen at [facility], had a ECHO Nl fx MV, cath: no CAD --Cryptogenic stroke 09/12/2016, had slurred speech. Was recommended a loop recorder.started plavix -- new R PICA stroke, admitted 11-30-16, implanted loop recorder  placed --A-FIB noted on ILR ~ 12-21-2016, changed to eliquis  -- 11/2017: Acute L Hippocampus CVA with acute L posterior cerabral artery Elevated PSA and BPH-- Dr. [name] Colon cancer partial colectomy 1990, colonoscopy 08-2007 negative, Cscope again 09-2012 normal, 5 years  SCC . MOHS 06/2017  PLAN:   Allergies: Patient with symptoms as described above, most likely allergies, doubt Covid but we discussed testing.  He decided not to proceed today but information was given to proceed with testing at anytime in the near future. Watch for fever, chills, myalgias, GI  symptoms.  If they happen then definitely will be tested and let me know. Otherwise continue Flonase and Claritin.  If not better we could add Astelin. Patient education: Plans to get together with his son, daughter-in-law and grandson due to a birthday, I explained that that is a risky situation but if he mass, I advised him about mitigation of risk. He is somewhat discouraged because could not get the vaccination, encouraged to continue pay attention to the local news and information from our website.     I discussed the assessment and treatment plan with the patient. The patient was provided an opportunity to ask questions and all were answered. The patient agreed with the plan and demonstrated an understanding of the instructions.   The patient was advised to call back or seek an in-person evaluation if the symptoms worsen or if the condition fails to improve as anticipated.

## 2019-03-28 NOTE — Progress Notes (Signed)
Pre visit review using our clinic review tool, if applicable. No additional management support is needed unless otherwise documented below in the visit note. 

## 2019-03-29 DIAGNOSIS — Z20828 Contact with and (suspected) exposure to other viral communicable diseases: Secondary | ICD-10-CM | POA: Diagnosis not present

## 2019-03-29 DIAGNOSIS — J310 Chronic rhinitis: Secondary | ICD-10-CM | POA: Insufficient documentation

## 2019-03-29 NOTE — Assessment & Plan Note (Signed)
Allergies: Patient with symptoms as described above, most likely allergies, doubt Covid but we discussed testing.  He decided not to proceed today but information was given to proceed with testing at anytime in the near future. Watch for fever, chills, myalgias, GI symptoms.  If they happen then definitely will be tested and let me know. Otherwise continue Flonase and Claritin.  If not better we could add Astelin. Patient education: Plans to get together with his son, daughter-in-law and grandson due to a birthday, I explained that that is a risky situation but if he mass, I advised him about mitigation of risk. He is somewhat discouraged because could not get the vaccination, encouraged to continue pay attention to the local news and information from our website.

## 2019-04-10 ENCOUNTER — Telehealth: Payer: Self-pay | Admitting: Internal Medicine

## 2019-04-10 NOTE — Telephone Encounter (Signed)
New Message   Patient is calling because he needs a letter to obtain a covid vaccine. He is aware that he can get the vaccine but he states that [facility] is requesting a letter.

## 2019-04-10 NOTE — Telephone Encounter (Signed)
Letter composed and sent to patient via MyChart

## 2019-04-13 DIAGNOSIS — G500 Trigeminal neuralgia: Secondary | ICD-10-CM | POA: Diagnosis not present

## 2019-04-17 ENCOUNTER — Telehealth: Payer: Self-pay | Admitting: Internal Medicine

## 2019-04-17 NOTE — Chronic Care Management (AMB) (Signed)
Chronic Care Management   Note  04/17/2019 Name: Kevin Bass MRN: [id number] DOB: 07/30/1940  Kevin Bass is a 79 y.o. year old male who is a primary care patient of Colon Branch, MD. I reached out to Pauline Aus by phone today in response to a referral sent by Mr. Felix Ahmadi Doeden's PCP, Colon Branch, MD.   Kevin Bass was given information about Chronic Care Management services today including:  1. CCM service includes personalized support from designated clinical staff supervised by his physician, including individualized plan of care and coordination with other care providers 2. 24/7 contact phone numbers for assistance for urgent and routine care needs. 3. Service will only be billed when office clinical staff spend 20 minutes or more in a month to coordinate care. 4. Only one practitioner may furnish and bill the service in a calendar month. 5. The patient may stop CCM services at any time (effective at the end of the month) by phone call to the office staff. 6. The patient will be responsible for cost sharing (co-pay) of up to 20% of the service fee (after annual deductible is met).  Patient agreed to services and verbal consent obtained.   Follow up plan:   Raynicia Dukes [employer] Scheduler

## 2019-04-20 ENCOUNTER — Other Ambulatory Visit: Payer: Self-pay

## 2019-04-20 DIAGNOSIS — I10 Essential (primary) hypertension: Secondary | ICD-10-CM

## 2019-04-20 DIAGNOSIS — I480 Paroxysmal atrial fibrillation: Secondary | ICD-10-CM

## 2019-04-20 DIAGNOSIS — Z8673 Personal history of transient ischemic attack (TIA), and cerebral infarction without residual deficits: Secondary | ICD-10-CM

## 2019-04-24 ENCOUNTER — Ambulatory Visit: Payer: PPO | Admitting: Pharmacist

## 2019-04-24 ENCOUNTER — Other Ambulatory Visit: Payer: Self-pay

## 2019-04-24 DIAGNOSIS — F32A Depression, unspecified: Secondary | ICD-10-CM

## 2019-04-24 DIAGNOSIS — I480 Paroxysmal atrial fibrillation: Secondary | ICD-10-CM

## 2019-04-24 DIAGNOSIS — F329 Major depressive disorder, single episode, unspecified: Secondary | ICD-10-CM

## 2019-04-24 DIAGNOSIS — E785 Hyperlipidemia, unspecified: Secondary | ICD-10-CM

## 2019-04-24 DIAGNOSIS — R202 Paresthesia of skin: Secondary | ICD-10-CM

## 2019-04-24 DIAGNOSIS — K219 Gastro-esophageal reflux disease without esophagitis: Secondary | ICD-10-CM

## 2019-04-24 DIAGNOSIS — J301 Allergic rhinitis due to pollen: Secondary | ICD-10-CM

## 2019-04-24 DIAGNOSIS — R739 Hyperglycemia, unspecified: Secondary | ICD-10-CM

## 2019-04-24 DIAGNOSIS — I10 Essential (primary) hypertension: Secondary | ICD-10-CM

## 2019-04-24 NOTE — Chronic Care Management (AMB) (Signed)
Chronic Care Management Pharmacy  Name: Kevin Bass  MRN: [id number] DOB: 04/30/1940  Chief Complaint/ HPI  Kevin Bass,  79 y.o. , male presents for their Initial CCM visit with the clinical pharmacist via telephone due to COVID-19 Pandemic.  PCP : Colon Branch, MD  Their chronic conditions include: AFib, Pre-DM, HTN, HLD, Hx of Stroke, Anxiety/Depression, GERD, Allergic Rhinitis, Paresthesia  Office Visits: 03/28/19: Visit w/ Dr. [name] - Increased risk of exposure to COVID. Symptoms most likely allergy related, but COVID testing discussed. Continue flonase and claritin. If symptoms continued could add Asetlin.  Consult Visit: 04/13/19: Ophthalmology visit w/ Dr. Antionette Fairy Hervey Ard shooting eye pain. Recommend follow up with neurologist considering possibility of neuralgia and possible adjustment to gabapentin dose and/or other tx options.   03/29/19: [facility] - COVID Test d/t exposure. Negative result  Medications: Outpatient Encounter Medications as of 04/24/2019  Medication Sig Note  . ALPRAZolam (XANAX) 0.25 MG tablet TAKE 1 TABLET 2 TABLET BY MOUTH TWICE DAILY AS NEEDED FOR ANXIETY   . amLODipine (NORVASC) 5 MG tablet Take 1 tablet (5 mg total) by mouth daily.   . Arginine 500 MG CAPS Take 1,000 mg by mouth daily.    Marland Kitchen ascorbic acid (VITAMIN C) 500 MG/5ML syrup Take 1,000 mg by mouth 2 (two) times daily.   Marland Kitchen ELIQUIS 5 MG TABS tablet TAKE 1 TABLET BY MOUTH TWICE DAILY   . ezetimibe (ZETIA) 10 MG tablet Take 1 tablet (10 mg total) by mouth daily.   Marland Kitchen gabapentin (NEURONTIN) 300 MG capsule Take 2 capsules (300mg ) in the AM and 3 capsules (900mg ) in the evening 04/24/2019: 1AM and 3HS  . loratadine (CLARITIN) 10 MG tablet Take 10 mg by mouth at bedtime.    Marland Kitchen losartan (COZAAR) 100 MG tablet Take 1 tablet (100 mg total) by mouth daily. 04/24/2019: Taking #3 25mg  tabs (75mg )  . Lycopene 10 MG CAPS Take 1 capsule by mouth 2 (two) times daily. JARROW FORMULA LYCO-SORB  PROSTATE SUPPORT    . metoprolol succinate (TOPROL-XL) 25 MG 24 hr tablet Take 3 tablets (75 mg total) by mouth daily. 04/24/2019: Taking #3 25mg  tablets (75mg )  . Multiple Vitamins-Minerals (OCUVITE PRESERVISION PO) Take 1 tablet by mouth 2 (two) times daily.    . Omega-3 Fatty Acids (FISH OIL PO) Take 1,600 mg by mouth 2 (two) times daily.    Marland Kitchen omeprazole (PRILOSEC) 40 MG capsule Take 1 capsule (40 mg total) by mouth daily.   . rosuvastatin (CRESTOR) 40 MG tablet Take 1 tablet (40 mg total) by mouth daily.   . Glucosamine-MSM-Hyaluronic Acd (JOINT HEALTH PO) Take 1 tablet by mouth daily. JARROW JOINT BUILDER    No facility-administered encounter medications on file as of 04/24/2019.   Immunization History  Administered Date(s) Administered  . Fluad Quad(high Dose 65+) 11/13/2018  . Influenza Whole 03/10/2007, 11/17/2007  . Influenza-Unspecified 01/17/2017  . Pneumococcal Conjugate-13 01/16/2014  . Pneumococcal Polysaccharide-23 01/12/2005, 04/21/2010  . Td 06/15/2006, 01/05/2015  . Tdap 12/13/2016  . Zoster 03/25/1998  . Zoster Recombinat (Shingrix) 02/03/2017, 12/14/2018     Current Diagnosis/Assessment:  Goals Addressed            This Visit's Progress   . A1c goal less than 6.5%      . Blood pressure goal less than 140/90      . Check blood pressure 3-4 times weekly and record readings      . Pharmacy Care Plan  Current Barriers:  . Chronic Disease Management support, education, and care coordination needs related to AFib, Pre-DM, HTN, HLD, Hx of Stroke, Anxiety/Depression, GERD, Allergic Rhinitis, Paresthesia  Pharmacist Clinical Goal(s):  Marland Kitchen A1c goal <6.5% . Blood pressure goal less than 140/90  Interventions: . Comprehensive medication review performed. . Check blood pressure 3-4 times per week . Practice moderation with carbohydrates  Patient Self Care Activities:  . Patient verbalizes understanding of plan to follow as described above, Self administers  medications as prescribed, Calls pharmacy for medication refills, and Calls provider office for new concerns or questions  Initial goal documentation     . Practice moderation with carbohydrates        Social Hx: From [place]. Came to [place] when he was 20. Lived in [place] for 4 years. Wife passed away Sep 12, 2017. 2 kids (daughter in [place], son in [place]). 2 grandkids.  Dr. [name] patient for over 30 years.  Uses a list that he has on his counter.  Uses pill box for a few days a week  AFIB   Patient is currently rate controlled.  Denies any current symptoms. Has had multiple strokes Has had mitral valve repair Cardio recommended that he start Eliquis  Patient has failed these meds in past: None noted Patient is currently controlled on the following medications: eliquis 5mg  BID, metoprolol succinate 75mg  daily  We discussed:  importance of medication adherence  Plan -Continue current medications   Pre-Diabetes   Recent Relevant Labs: Lab Results  Component Value Date/Time   HGBA1C 5.9 12/11/2018 11:01 AM   HGBA1C 5.3 12/03/2017 06:26 AM    Patient is currently controlled on the following medications: None noted  We discussed: diet and exercise extensively  Plan -Continue control with diet and exercise   Hypertension   CMP Latest Ref Rng & Units 03/12/2019 11/13/2018 08/09/2018  Glucose 70 - 99 mg/dL 108(H) 99 111(H)  BUN 6 - 23 mg/dL 22 14 17   Creatinine 0.40 - 1.50 mg/dL 0.98 0.96 0.92  Sodium 135 - 145 mEq/L 140 139 140  Potassium 3.5 - 5.1 mEq/L 4.2 4.4 4.4  Chloride 96 - 112 mEq/L 104 102 104  CO2 19 - 32 mEq/L 31 33(H) 31  Calcium 8.4 - 10.5 mg/dL 9.3 9.5 9.0  Total Protein 6.0 - 8.3 g/dL - - 6.2  Total Bilirubin 0.2 - 1.2 mg/dL - - 0.5  Alkaline Phos 39 - 117 U/L - - 51  AST 0 - 37 U/L - - 17  ALT 0 - 53 U/L - - 17    BP today is:  141/76 pulse 67  Office blood pressures are  BP Readings from Last 3 Encounters:  03/28/19 124/72  12/13/18 (!)  161/82  12/11/18 130/73    Patient has failed these meds in the past: None noted  Patient is currently controlled on the following medications: amlodipine 5mg  daily, losartan 75mg  daily (pt did not continue with 100mg ), metoprolol succinate 75mg  daily?  Patient states he picked up prescription for losartan 100mg , but felt his blood pressure was getting normal and that he would be ok with 75mg  of losartan. He is currently [place] #3 tablets of losartan 25mg  and #3 tablets of metoprolol succinate 25mg    Patient is cautious about increased dose of losartan thinking it may be too much.   Patient checks BP at home daily  Patient home BP readings are ranging: 120-130s/60s-70s  We discussed: Proper blood pressure measurement technique, signs and symptoms of low pressure and  checking blood pressure if those symptoms occur  Plan -Check blood pressure 3-4 times per week and record readings -Continue current medications   Hyperlipidemia   Lipid Panel     Component Value Date/Time   CHOL 91 12/11/2018 1101   TRIG 127.0 12/11/2018 1101   HDL 40.30 12/11/2018 1101   CHOLHDL 2 12/11/2018 1101   VLDL 25.4 12/11/2018 1101   LDLCALC 26 12/11/2018 1101   LDLDIRECT 149.1 08/30/2012 1352     ASCVD 10-year risk: Hx of ASCVD  LDL Goal <70  Patient has failed these meds in past: simvastatin, pravastatin Patient is currently controlled on the following medications: ezetimibe 10mg  daily, rosuvastatin 40mg  daily, omega 3 1600mg  daily   Plan -Continue current medications  Hx of Stroke    Patient has failed these meds in past: clopidogrel (had strokes through this medication) Patient is currently controlled on the following medications: rosuvastatin 40mg  daily  Plan -Continue current medications   Anxiety/Depression    Patient has failed these meds in past: escitalopram (short term use after his wife passed)  Patient is currently controlled on the following medications: alprazolam  0.25mg  1-2 tabs BID PRN Anxiety  Alprazolam: last used a month ago. Uses about 3 times per month when thing  Plan -Continue current medications   GERD    Patient has failed these meds in past: None Patient is currently controlled on the following medications: omeprazole 40mg  every other Erven Ramson   Breakthrough Symptoms: None  Plan -Continue current medications   Allergic Rhinitis    Patient has failed these meds in past: None noted Patient is currently controlled on the following medications: loratadine 10mg  daily, flonase in the mornings 2 sprays in each nostril   Plan -Continue current medications   Paresthesia    Patient has failed these meds in past: None noted Patient is currently controlled on the following medications: gabapentin 300mg  2 caps AM, 3 caps PM  Plan -Continue current medications    Miscellanous Meds Information Retention Fish oil 1,100mg  KSM - 66 300mg   Gingko 60mg  Gotu [place] 50mg

## 2019-04-25 NOTE — Patient Instructions (Signed)
Visit Information  Goals Addressed            This Visit's Progress   . A1c goal less than 6.5%      . Avoid medications that can increase bleeding risk while taking Eliquis      . Blood pressure goal less than 140/90      . Check blood pressure 3-4 times weekly and record readings      . Pharmacy Care Plan       Current Barriers:  . Chronic Disease Management support, education, and care coordination needs related to AFib, Pre-DM, HTN, HLD, Hx of Stroke, Anxiety/Depression, GERD, Allergic Rhinitis, Paresthesia  Pharmacist Clinical Goal(s):  Marland Kitchen A1c goal <6.5% . Blood pressure goal less than 140/90  Interventions: . Comprehensive medication review performed. . Check blood pressure 3-4 times per week . Practice moderation with carbohydrates  Patient Self Care Activities:  . Patient verbalizes understanding of plan to follow as described above, Self administers medications as prescribed, Calls pharmacy for medication refills, and Calls provider office for new concerns or questions  Initial goal documentation     . Practice moderation with carbohydrates         Kevin Bass was given information about Chronic Care Management services today including:  1. CCM service includes personalized support from designated clinical staff supervised by his physician, including individualized plan of care and coordination with other care providers 2. 24/7 contact phone numbers for assistance for urgent and routine care needs. 3. Service will only be billed when office clinical staff spend 20 minutes or more in a month to coordinate care. 4. Only one practitioner may furnish and bill the service in a calendar month. 5. The patient may stop CCM services at any time (effective at the end of the month) by phone call to the office staff. 6. The patient will be responsible for cost sharing (co-pay) of up to 20% of the service fee (after annual deductible is met).  Patient agreed to services and  verbal consent obtained.   The patient verbalized understanding of instructions provided today and agreed to receive a mailed copy of patient instruction and/or educational materials. The pharmacy team will reach out to the patient again over the next 30 days.   De Blanch, PharmD Clinical Pharmacist [facility] [phone]   Prediabetes Eating Plan Prediabetes is a condition that causes blood sugar (glucose) levels to be higher than normal. This increases the risk for developing diabetes. In order to prevent diabetes from developing, your health care provider may recommend a diet and other lifestyle changes to help you:  Control your blood glucose levels.  Improve your cholesterol levels.  Manage your blood pressure. Your health care provider may recommend working with a diet and nutrition specialist (dietitian) to make a meal plan that is best for you. What are tips for following this plan? Lifestyle  Set weight loss goals with the help of your health care team. It is recommended that most people with prediabetes lose 7% of their current body weight.  Exercise for at least 30 minutes at least 5 days a week.  Attend a support group or seek ongoing support from a mental health counselor.  Take over-the-counter and prescription medicines only as told by your health care provider. Reading food labels  Read food labels to check the amount of fat, salt (sodium), and sugar in prepackaged foods. Avoid foods that have: ? Saturated fats. ? Trans fats. ? Added sugars.  Avoid foods that have more than 300 milligrams (mg) of sodium per serving. Limit your daily sodium intake to less than 2,300 mg each Arwen Haseley. Shopping  Avoid buying pre-made and processed foods. Cooking  Cook with olive oil. Do not use butter, lard, or ghee.  Bake, broil, grill, or boil foods. Avoid frying. Meal planning   Work with your dietitian to develop an eating plan that is  right for you. This may include: ? Tracking how many calories you take in. Use a food diary, notebook, or mobile application to track what you eat at each meal. ? Using the glycemic index (GI) to plan your meals. The index tells you how quickly a food will raise your blood glucose. Choose low-GI foods. These foods take a longer time to raise blood glucose.  Consider following a Mediterranean diet. This diet includes: ? Several servings each Eldar Robitaille of fresh fruits and vegetables. ? Eating fish at least twice a week. ? Several servings each Adiyah Lame of whole grains, beans, nuts, and seeds. ? Using olive oil instead of other fats. ? Moderate alcohol consumption. ? Eating small amounts of red meat and whole-fat dairy.  If you have high blood pressure, you may need to limit your sodium intake or follow a diet such as the DASH eating plan. DASH is an eating plan that aims to lower high blood pressure. What foods are recommended? The items listed below may not be a complete list. Talk with your dietitian about what dietary choices are best for you. Grains Whole grains, such as whole-wheat or whole-grain breads, crackers, cereals, and pasta. Unsweetened oatmeal. Bulgur. Barley. Quinoa. Brown rice. Corn or whole-wheat flour tortillas or taco shells. Vegetables Lettuce. Spinach. Peas. Beets. Cauliflower. Cabbage. Broccoli. Carrots. Tomatoes. Squash. Eggplant. Herbs. Peppers. Onions. Cucumbers. Brussels sprouts. Fruits Berries. Bananas. Apples. Oranges. Grapes. Papaya. Mango. Pomegranate. Kiwi. Grapefruit. Cherries. Meats and other protein foods Seafood. Poultry without skin. Lean cuts of pork and beef. Tofu. Eggs. Nuts. Beans. Dairy Low-fat or fat-free dairy products, such as yogurt, cottage cheese, and cheese. Beverages Water. Tea. Coffee. Sugar-free or diet soda. Seltzer water. Lowfat or no-fat milk. Milk alternatives, such as soy or almond milk. Fats and oils Olive oil. Canola oil. Sunflower oil.  Grapeseed oil. Avocado. Walnuts. Sweets and desserts Sugar-free or low-fat pudding. Sugar-free or low-fat ice cream and other frozen treats. Seasoning and other foods Herbs. Sodium-free spices. Mustard. Relish. Low-fat, low-sugar ketchup. Low-fat, low-sugar barbecue sauce. Low-fat or fat-free mayonnaise. What foods are not recommended? The items listed below may not be a complete list. Talk with your dietitian about what dietary choices are best for you. Grains Refined white flour and flour products, such as bread, pasta, snack foods, and cereals. Vegetables Canned vegetables. Frozen vegetables with butter or cream sauce. Fruits Fruits canned with syrup. Meats and other protein foods Fatty cuts of meat. Poultry with skin. Breaded or fried meat. Processed meats. Dairy Full-fat yogurt, cheese, or milk. Beverages Sweetened drinks, such as sweet iced tea and soda. Fats and oils Butter. Lard. Ghee. Sweets and desserts Baked goods, such as cake, cupcakes, pastries, cookies, and cheesecake. Seasoning and other foods Spice mixes with added salt. Ketchup. Barbecue sauce. Mayonnaise. Summary  To prevent diabetes from developing, you may need to make diet and other lifestyle changes to help control blood sugar, improve cholesterol levels, and manage your blood pressure.  Set weight loss goals with the help of your health care team. It is recommended that most people with prediabetes lose 7 percent  of their current body weight.  Consider following a Mediterranean diet that includes plenty of fresh fruits and vegetables, whole grains, beans, nuts, seeds, fish, lean meat, low-fat dairy, and healthy oils. This information is not intended to replace advice given to you by your health care provider. Make sure you discuss any questions you have with your health care provider. Document Revised: 06/02/2018 Document Reviewed: 04/14/2016 Elsevier Patient Education  [place].    Blood  Pressure Record Sheet To take your blood pressure, you will need a blood pressure machine. You can buy a blood pressure machine (blood pressure monitor) at your clinic, drug store, or online. When choosing one, consider:  An automatic monitor that has an arm cuff.  A cuff that wraps snugly around your upper arm. You should be able to fit only one finger between your arm and the cuff.  A device that stores blood pressure reading results.  Do not choose a monitor that measures your blood pressure from your wrist or finger. Follow your health care provider's instructions for how to take your blood pressure. To use this form:  Get one reading in the morning (a.m.) before you take any medicines.  Get one reading in the evening (p.m.) before supper.  Take at least 2 readings with each blood pressure check. This makes sure the results are correct. Wait 1-2 minutes between measurements.  Write down the results in the spaces on this form.  Repeat this once a week, or as told by your health care provider.  Make a follow-up appointment with your health care provider to discuss the results. Blood pressure log Date: _______________________  a.m. _____________________(1st reading) _____________________(2nd reading)  p.m. _____________________(1st reading) _____________________(2nd reading) Date: _______________________  a.m. _____________________(1st reading) _____________________(2nd reading)  p.m. _____________________(1st reading) _____________________(2nd reading) Date: _______________________  a.m. _____________________(1st reading) _____________________(2nd reading)  p.m. _____________________(1st reading) _____________________(2nd reading) Date: _______________________  a.m. _____________________(1st reading) _____________________(2nd reading)  p.m. _____________________(1st reading) _____________________(2nd reading) Date: _______________________  a.m.  _____________________(1st reading) _____________________(2nd reading)  p.m. _____________________(1st reading) _____________________(2nd reading) This information is not intended to replace advice given to you by your health care provider. Make sure you discuss any questions you have with your health care provider. Document Revised: 04/08/2017 Document Reviewed: 02/08/2017 Elsevier Patient Education  [place].   How to Take Your Blood Pressure You can take your blood pressure at home with a machine. You may need to check your blood pressure at home:  To check if you have high blood pressure (hypertension).  To check your blood pressure over time.  To make sure your blood pressure medicine is working. Supplies needed: You will need a blood pressure machine, or monitor. You can buy one at a drugstore or online. When choosing one:  Choose one with an arm cuff.  Choose one that wraps around your upper arm. Only one finger should fit between your arm and the cuff.  Do not choose one that measures your blood pressure from your wrist or finger. Your doctor can suggest a monitor. How to prepare Avoid these things for 30 minutes before checking your blood pressure:  Drinking caffeine.  Drinking alcohol.  Eating.  Smoking.  Exercising. Five minutes before checking your blood pressure:  Pee.  Sit in a dining chair. Avoid sitting in a soft couch or armchair.  Be quiet. Do not talk. How to take your blood pressure Follow the instructions that came with your machine. If you have a digital blood pressure monitor, these may be  the instructions: 1. Sit up straight. 2. Place your feet on the floor. Do not cross your ankles or legs. 3. Rest your left arm at the level of your heart. You may rest it on a table, desk, or chair. 4. Pull up your shirt sleeve. 5. Wrap the blood pressure cuff around the upper part of your left arm. The cuff should be 1 inch (2.5 cm) above your  elbow. It is best to wrap the cuff around bare skin. 6. Fit the cuff snugly around your arm. You should be able to place only one finger between the cuff and your arm. 7. Put the cord inside the groove of your elbow. 8. Press the power button. 9. Sit quietly while the cuff fills with air and loses air. 10. Write down the numbers on the screen. 11. Wait 2-3 minutes and then repeat steps 1-10. What do the numbers mean? Two numbers make up your blood pressure. The first number is called systolic pressure. The second is called diastolic pressure. An example of a blood pressure reading is "120 over 80" (or 120/80). If you are an adult and do not have a medical condition, use this guide to find out if your blood pressure is normal: Normal  First number: below 120.  Second number: below 80. Elevated  First number: 120-129.  Second number: below 80. Hypertension stage 1  First number: 130-139.  Second number: 80-89. Hypertension stage 2  First number: 140 or above.  Second number: 77 or above. Your blood pressure is above normal even if only the top or bottom number is above normal. Follow these instructions at home:  Check your blood pressure as often as your doctor tells you to.  Take your monitor to your next doctor's appointment. Your doctor will: ? Make sure you are using it correctly. ? Make sure it is working right.  Make sure you understand what your blood pressure numbers should be.  Tell your doctor if your medicines are causing side effects. Contact a doctor if:  Your blood pressure keeps being high. Get help right away if:  Your first blood pressure number is higher than 180.  Your second blood pressure number is higher than 120. This information is not intended to replace advice given to you by your health care provider. Make sure you discuss any questions you have with your health care provider. Document Revised: 01/21/2017 Document Reviewed:  07/18/2015 Elsevier Patient Education  2020 [employer].

## 2019-04-30 ENCOUNTER — Ambulatory Visit (INDEPENDENT_AMBULATORY_CARE_PROVIDER_SITE_OTHER): Payer: PPO | Admitting: *Deleted

## 2019-04-30 DIAGNOSIS — I639 Cerebral infarction, unspecified: Secondary | ICD-10-CM

## 2019-05-01 LAB — CUP PACEART REMOTE DEVICE CHECK
Date Time Interrogation Session: 20210308161122
Implantable Pulse Generator Implant Date: 20181010

## 2019-05-01 NOTE — Progress Notes (Signed)
ILR Remote 

## 2019-05-02 DIAGNOSIS — H35362 Drusen (degenerative) of macula, left eye: Secondary | ICD-10-CM | POA: Diagnosis not present

## 2019-05-02 DIAGNOSIS — H5203 Hypermetropia, bilateral: Secondary | ICD-10-CM | POA: Diagnosis not present

## 2019-05-02 DIAGNOSIS — Z8673 Personal history of transient ischemic attack (TIA), and cerebral infarction without residual deficits: Secondary | ICD-10-CM | POA: Diagnosis not present

## 2019-05-02 DIAGNOSIS — Z8679 Personal history of other diseases of the circulatory system: Secondary | ICD-10-CM | POA: Diagnosis not present

## 2019-05-02 DIAGNOSIS — H43812 Vitreous degeneration, left eye: Secondary | ICD-10-CM | POA: Diagnosis not present

## 2019-05-02 DIAGNOSIS — G500 Trigeminal neuralgia: Secondary | ICD-10-CM | POA: Diagnosis not present

## 2019-05-02 DIAGNOSIS — H524 Presbyopia: Secondary | ICD-10-CM | POA: Diagnosis not present

## 2019-05-02 DIAGNOSIS — H43821 Vitreomacular adhesion, right eye: Secondary | ICD-10-CM | POA: Diagnosis not present

## 2019-05-02 DIAGNOSIS — H52223 Regular astigmatism, bilateral: Secondary | ICD-10-CM | POA: Diagnosis not present

## 2019-05-16 DIAGNOSIS — M316 Other giant cell arteritis: Secondary | ICD-10-CM | POA: Diagnosis not present

## 2019-05-16 DIAGNOSIS — G500 Trigeminal neuralgia: Secondary | ICD-10-CM | POA: Diagnosis not present

## 2019-05-21 ENCOUNTER — Other Ambulatory Visit: Payer: Self-pay

## 2019-05-21 MED ORDER — METOPROLOL SUCCINATE ER 25 MG PO TB24: 75.0000 mg | ORAL_TABLET | Freq: Every day | ORAL | 1 refills | Status: DC

## 2019-05-24 ENCOUNTER — Telehealth: Payer: Self-pay

## 2019-05-24 NOTE — Telephone Encounter (Signed)
Pt called informed that going to the [facility] is a possible trigger, but sometimes flare up can just happen without any triggers.

## 2019-05-24 NOTE — Telephone Encounter (Signed)
Pt is wanting to know if him going to the [facility] and them drilling on his teeth caused the trigeminal neuralgia to flare up

## 2019-05-24 NOTE — Telephone Encounter (Signed)
Sound like his trigeminal neuralgia is acting up, how is he taking gabapentin 300mg , is it still 1 cap in AM, 3 caps in PM? If yes, increase gabapentin to 2 caps in AM, 3 caps in PM. I have 2 cancellation spots on Monday if he still wants to do a visit. Thanks

## 2019-05-24 NOTE — Telephone Encounter (Signed)
Pt called pt reports shooting pain RT eye, face forehead head cannot touch due to intense pain. Walking causing shooting pain in RT eye. On and off last week worsening since yesterday. Please advise. [phone]. Pt states televisit if possible.

## 2019-05-24 NOTE — Telephone Encounter (Signed)
It's possible, but sometimes flare up can just happen without any triggers.

## 2019-05-28 ENCOUNTER — Other Ambulatory Visit: Payer: Self-pay | Admitting: Internal Medicine

## 2019-05-28 ENCOUNTER — Other Ambulatory Visit: Payer: Self-pay

## 2019-05-28 ENCOUNTER — Ambulatory Visit (INDEPENDENT_AMBULATORY_CARE_PROVIDER_SITE_OTHER): Payer: PPO | Admitting: Neurology

## 2019-05-28 ENCOUNTER — Encounter: Payer: Self-pay | Admitting: Neurology

## 2019-05-28 VITALS — BP 124/70 | HR 61 | Resp 18 | Ht 70.0 in | Wt 182.2 lb

## 2019-05-28 DIAGNOSIS — G629 Polyneuropathy, unspecified: Secondary | ICD-10-CM

## 2019-05-28 DIAGNOSIS — I639 Cerebral infarction, unspecified: Secondary | ICD-10-CM | POA: Diagnosis not present

## 2019-05-28 DIAGNOSIS — G500 Trigeminal neuralgia: Secondary | ICD-10-CM | POA: Diagnosis not present

## 2019-05-28 NOTE — Patient Instructions (Signed)
Great seeing you! Continue gabapentin 300mg : take 1 tab in AM, 1 tab at noon, 3 tabs at night for now. Continue all your other medications. Follow-up in 5 months, call for any changes

## 2019-05-28 NOTE — Progress Notes (Signed)
NEUROLOGY FOLLOW UP OFFICE NOTE  Legacy Lacivita [id number] 1940-04-27  HISTORY OF PRESENT ILLNESS: I had the pleasure of seeing Sher Hellinger in follow-up in the neurology clinic on 05/28/2019.  The patient was last seen 6 months ago for recurrent strokes (x3) and neuropathy. He presents today due to a flare up of trigeminal neuralgia. He has been on gabapentin for this and was instructed 4 days ago to increase dose to 620m in AM, 9072min PM. He feels a little better, he mostly feels pain when he wakes up in the morning. The pain started on the top of his head, down his forehead and right side of his eye. When he touches his temple in the morning, he feels it, but the past 2 days have been quiet. When he called our office, even touching his hair would cause pain. He reports having dental work on the right side a couple of weeks ago and wonders if this triggered current flare. He has changed gabapentin intake to 30071mn AM, 300m64m noon, 900mg37m. He denies any focal weakness. Neuropathy has good and bad days, he denies any pain/burning/stabbing, he mostly notices his right hand or leg/foot gets cold. He denies any dizziness, vision changes, no falls. Sleep is good. His memory is not as good as he would like it to be, he denies getting lost driving, no missed bills or medications.   History on Initial Assessment 10/01/2016: This is a pleasant 79 yo67H man with a history of colon cancer, hyperlipidemia, previous mitral valve repair in his usual state of health until 09/11/16 after taking a walk, he saw his brother-in-law and when he started noticing word-finding difficulties. His wife reported that his speech was not necessarily slurred but he was having trouble finding his words. When symptoms were unchanged the next day, he decided to go to the ER. He was still noted to have mild word-finding difficulties. There was no focal numbness/tingling/weakness. He denied any headaches, dizziness,  diplopia, dysarthria/dysphagia. I personally reviewed MRI brain without contrast which showed an acute infarct in the left posterior insula and parietal operculum. MRA head and carotid dopplers did not show any significant stenosis. Echocardiogram showed EF 60-65%, mild LVH, prior MV repair without significant stenosis or regurgitation, normal LA size. He was switched from aspirin to Plavix. LDL was 124, HbA1c was 5.6. He was unable to get a TEE in the hospital, and has seen Dr. HiltyDebara Pickettagreed to having the procedure done on 8/14 followed by loop recorder. He denies any palpitations, chest pain, shortness of breath. No neck/back pain, bowel/bladder dysfunction. No falls. He denies any side effect on Plavix. He was started on Crestor yesterday. He feels he is 95% or more back to baseline, he denies any confusion or focal symptoms. He reports a history of facial pain on the right upper lip radiating up his cheek with good response to gabapentin. He takes 300mg 6mwith no side effects.  Update 12/07/16: He had no residual deficits from prior stroke, then on 11/29/16 he had sudden onset vertigo that made him fall backward. This lasted for 15 seconds and was followed by a mild headache. He called triage and was instructed to go seek medical attention the next day. He went to the ER and was found to have a right PICA territory stroke. He was evaluated by Neurology with no focal deficits seen. He reports he was taking aspirin and Plavix when the stroke occurred. He had an extensive workup for  the prior stroke, he had a TEE in August 2018 which showed normal left atrium, no thrombus seen. He had a 48-hour monitor which was unremarkable. In the hospital, he had a CTA of the head and neck which did not show any occlusion or stenosis. Repeat TTE showed EF of 55-60%, left atrium mildly dilated. He had a CT chest/abdomen/pelvis which did not show any evidence of malignancy. On hospital discharge, he was instructed to  continue aspirin and Plavix and stop his antihypertensives. He had a loop recorder placed.  Update 12/06/2017: He underwent right median nerve and ulnar nerve decompression on 11/28/17. He had stopped the Eliquis for 2 days for the procedure, and restarted Eliquis on 11/29/17. On 12/02/17, he took a nap and woke up with right arm weakness and numbness. He could not lift his right arm up. After 20 minutes, he started getting more feeling in his arm. Per records, he stood up and noticed his gait was off. The weakness improved after several minutes, but he continued to feel off balance. He went to the ER where SBP was elevated at 170. NIH 0 in the hospital. Records were reviewed, he had an MRI brain without contrast showing an acute infarct in the left hippocampus, occlusion of the left PCA which could be acute. There were chronic infarcts in the right cerebellum and left parietal operculum. He had an echo which showed an EF of 55-60%, left atrium normal, right atrium moderately dilated. No significant stenosis on carotid dopplers. LDL in August 2019 was 120, he was advised to increase Crestor to 77m daily. He was having muscle pains and stopped statin for 5 months, he was restarted Crestor with Zetia last August.  He was discharged home 2 days ago and reports that this is the worst of his strokes. It has affected his memory more than anything. He has also noticed vision changes on the right visual field, he describes blurred/double vision reading on his right side. He feels his strength is back but his balance is still off. He also has left foot pain. He has been dealing with his wife and sister's recent deaths.   Diagnostic Data: Neuropathy labs were normal (TSH, B12, ESR, folate, SPEP/IFE). He had an EMG/NCV of the right UE and LE which showed right ulnar neuropathy with slowing across the elbow, axon loss and demyelinating in type, mild to moderate in degree; right median neuropathy at or distal to the wrist,  consistent with carpal tunnel, mild in degree, and probable L5 radiculopathy, mild. No evidence of a large fiber sensorimotor polyneuropathy.    PAST MEDICAL HISTORY: Past Medical History:  Diagnosis Date  . Allergy   . Anxiety   . Arthritis    hands  . Atrial fibrillation ([facility]   . Colon cancer ([facility]    s/p partial colectomy 1990, Cscope neg 7-09, next 2014  . DISORDER, MITRAL VALVE    [place] [facility] [id number]still needs ABX for SBE prophylaxis   . Elevated PSA    prostate nodule, Dr BAlinda Money . GERD (gastroesophageal reflux disease)   . Heart murmur    no problems since surgery  . HYPERGLYCEMIA, BORDERLINE 10/16/2008  . HYPERLIPIDEMIA 06/15/2006   diet controlled  . Hypertension   . HYPERTROPHY PROSTATE W/O UR OBST & OTH LUTS 10/16/2008  . Neuropathy   . Right groin pain    Dr. WRedmond Pulling . SCC (squamous cell carcinoma) 06/2017   s/p Mohs---Dr BMilana Na . St[place] ([facility]   . Wears  partial dentures    lower partial   Outpatient Encounter Medications as of 05/28/2019  Medication Sig Note  . ALPRAZolam (XANAX) 0.25 MG tablet TAKE 1 TABLET 2 TABLET BY MOUTH TWICE DAILY AS NEEDED FOR ANXIETY   . amLODipine (NORVASC) 5 MG tablet Take 1 tablet (5 mg total) by mouth daily.   . Arginine 500 MG CAPS Take 1,000 mg by mouth daily.    Marland Kitchen ascorbic acid (VITAMIN C) 500 MG/5ML syrup Take 1,000 mg by mouth 2 (two) times daily.   Marland Kitchen ELIQUIS 5 MG TABS tablet TAKE 1 TABLET BY MOUTH TWICE DAILY   . gabapentin (NEURONTIN) 300 MG capsule Take 2 capsules (366m) in the AM and 3 capsules (9059m in the evening 04/24/2019: 1AM and 3HS  . Glucosamine-MSM-Hyaluronic Acd (JOINT HEALTH PO) Take 1 tablet by mouth daily. JARROW JOINT BUILDER   . loratadine (CLARITIN) 10 MG tablet Take 10 mg by mouth at bedtime.    . Marland Kitchenosartan (COZAAR) 100 MG tablet Take 1 tablet (100 mg total) by mouth daily. 04/24/2019: Taking #3 2537mabs (28m39m. Lycopene 10 MG CAPS Take 1 capsule by mouth 2 (two) times daily. JARROW FORMULA  LYCO-SORB PROSTATE SUPPORT    . metoprolol succinate (TOPROL-XL) 25 MG 24 hr tablet Take 3 tablets (75 mg total) by mouth daily.   . Multiple Vitamins-Minerals (OCUVITE PRESERVISION PO) Take 1 tablet by mouth 2 (two) times daily.    . Omega-3 Fatty Acids (FISH OIL PO) Take 1,600 mg by mouth 2 (two) times daily.    . omMarland Kitchenprazole (PRILOSEC) 40 MG capsule Take 1 capsule (40 mg total) by mouth daily.   . rosuvastatin (CRESTOR) 40 MG tablet Take 1 tablet (40 mg total) by mouth daily.         No facility-administered encounter medications on file as of 05/28/2019.   ALLERGIES: Allergies  Allergen Reactions  . Statins Other (See Comments)    Myalgias (intolerance)    FAMILY HISTORY: Family History  Problem Relation Age of Onset  . Colon cancer Other        M side (cousins)  . Diabetes Neg Hx   . Stroke Neg Hx   . CAD Neg Hx   . Prostate cancer Neg Hx   . Rectal cancer Neg Hx   . Esophageal cancer Neg Hx     SOCIAL HISTORY: Social History   Socioeconomic History  . Marital status: Widowed    Spouse name: Kevin Bass  . Number of children: 2  . Years of education: college  . Highest education level: Not on file  Occupational History  . Occupation: retired   Tobacco Use  . Smoking status: Former Smoker    Packs/day: 0.50    Years: 20.00    Pack years: 10.00    Types: Cigarettes    Quit date: 02/23/1983    Years since quitting: 36.2  . Smokeless tobacco: Never Used  Substance and Sexual Activity  . Alcohol use: Not Currently    Alcohol/week: 7.0 standard drinks    Types: 7 Glasses of wine per week  . Drug use: No  . Sexual activity: Not Currently  Other Topics Concern  . Not on file  Social History Narrative   From [place] wife 08/2017 to cancer   [place]      Social Determinants of Health   Financial Resource Strain:   . Difficulty of Paying Living Expenses:   Food Insecurity:   .  Worried About Charity fundraiser in the Last  Year:   . Arboriculturist in the Last Year:   Transportation Needs:   . Film/video editor (Medical):   Marland Kitchen Lack of Transportation (Non-Medical):   Physical Activity:   . Days of Exercise per Week:   . Minutes of Exercise per Session:   Stress:   . Feeling of Stress :   Social Connections:   . Frequency of Communication with Friends and Family:   . Frequency of Social Gatherings with Friends and Family:   . Attends Religious Services:   . Active Member of Clubs or Organizations:   . Attends Archivist Meetings:   Marland Kitchen Marital Status:   Intimate Partner Violence:   . Fear of Current or Ex-Partner:   . Emotionally Abused:   Marland Kitchen Physically Abused:   . Sexually Abused:     REVIEW OF SYSTEMS: Constitutional: No fevers, chills, or sweats, no generalized fatigue, change in appetite Eyes: No visual changes, double vision, eye pain Ear, nose and throat: No hearing loss, ear pain, nasal congestion, sore throat Cardiovascular: No chest pain, palpitations Respiratory:  No shortness of breath at rest or with exertion, wheezes GastrointestinaI: No nausea, vomiting, diarrhea, abdominal pain, fecal incontinence Genitourinary:  No dysuria, urinary retention or frequency Musculoskeletal:  No neck pain, back pain Integumentary: No rash, pruritus, skin lesions Neurological: as above Psychiatric: No depression, insomnia, anxiety Endocrine: No palpitations, fatigue, diaphoresis, mood swings, change in appetite, change in weight, increased thirst Hematologic/Lymphatic:  No anemia, purpura, petechiae. Allergic/Immunologic: no itchy/runny eyes, nasal congestion, recent allergic reactions, rashes  PHYSICAL EXAM: Vitals:   05/28/19 1410  BP: 124/70  Pulse: 61  Resp: 18  SpO2: 99%   General: No acute distress Head:  Normocephalic/atraumatic Skin/Extremities: No rash, no edema Neurological Exam: alert and oriented to person, place, and time. No aphasia or dysarthria. Fund of knowledge is  appropriate.  Recent and remote memory are intact.  Attention and concentration are normal.  Cranial nerves: Pupils equal, round, reactive to light. Extraocular movements intact with no nystagmus. Visual fields full. Facial sensation intact. No facial asymmetry. Motor: Bulk and tone normal, muscle strength 5/5 throughout with no pronator drift.  Finger to nose testing intact.  Gait narrow-based and steady, mild difficulty with tandem walk   IMPRESSION: This is a pleasant 79 yo RH man with a history of colon cancer, hyperlipidemia, previous mitral valve repair with recurrent strokes (x3) secondary to atrial fibrillation, no residual deficits. He presents today for trigeminal neuralgia, pain improved with increase in gabapentin dose, continue 326m in AM, 3095mat noon, 90054mhs.Gabapentin also helps with neuropathy. Continue Eliquis and control of vascular risk factors for secondary stroke prevention. He knows to go to the ER for any sudden change in symptoms. Follow-up in 5 months, he knows to call for any changes.    Thank you for allowing me to participate in his care.  Please do not hesitate to call for any questions or concerns.   KarEllouise Newer.D.   CC: Dr. PazLarose Kells

## 2019-05-29 ENCOUNTER — Encounter: Payer: Self-pay | Admitting: Internal Medicine

## 2019-05-29 ENCOUNTER — Ambulatory Visit: Payer: PPO | Admitting: Internal Medicine

## 2019-05-29 VITALS — BP 130/68 | HR 58 | Temp 97.9°F | Ht 70.0 in | Wt 182.0 lb

## 2019-05-29 DIAGNOSIS — I480 Paroxysmal atrial fibrillation: Secondary | ICD-10-CM

## 2019-05-29 DIAGNOSIS — I10 Essential (primary) hypertension: Secondary | ICD-10-CM

## 2019-05-29 DIAGNOSIS — E785 Hyperlipidemia, unspecified: Secondary | ICD-10-CM

## 2019-05-29 NOTE — Progress Notes (Signed)
OFFICE CONSULT NOTE  Chief Complaint:  No complaints  Primary Care Physician: Colon Branch, MD  HPI:  Kevin Bass is a 79 y.o. male who is being seen today for the evaluation of workup of cryptogenic stroke at the request of Colon Branch, MD. Kevin Bass is a pleasant 79 year old patient who recently had an unfortunate cryptogenic stroke. His past ocular history significant for mitral valve disease status post repair at [facility]. He is followed by cardiologist there and recently was admitted to [facility] with cryptogenic stroke. As per routine care or transesophageal echocardiogram as well as a implanted loop recorder were recommended. Unfortunately there was difficulty during the transesophageal echocardiogram procedure which involved technical difficulties with equipment and it was not able to be completed. There was some concern that the probe could not be passed adequately and he underwent upper esophageal imaging which was normal. He has had prior teas without any difficulty. After that he decided against a loop recorder and wanted to get another opinion from his cardiologist at [facility]. There was a visit there which did not indicate that a transesophageal echocardiogram would be helpful. I do not see any evidence on his prior echocardiograms of a bubble study to exclude PFO. This could be a significant finding as there is evidence now that closure of a small PFO in the setting of a patient whose had a cryptogenic stroke is guideline indicated. It's also possible that he could have atrial fibrillation, especially given his history of mitral valve disease and valve repair which is a significant risk factor for arrhythmias.  12/21/2016  Kevin Bass was seen today in follow-up.  He underwent a repeat echocardiogram which did not demonstrate any LV thrombus or evidence for atrial septal defect.  Subsequently he had placement of an implanted loop recorder by Dr. Lovena Le which was seen in  follow-up and appears to be well-healed.  There is no evidence today of any significant arrhythmias such as atrial fibrillation.  He is currently on aspirin and Plavix for stroke prevention.  He is also had increasing doses of medication to control hypertension.  Currently his blood pressure is well controlled at 108/70.  He is appropriately on rosuvastatin, ezetimibe and fish oil with an LDL-C is 60 approximately 3 weeks ago.  06/17/2017  Kevin Bass returns today for follow-up.  Overall he says he feels fairly well.  He denies any significant palpitations or recurrent atrial fibrillation.  EKG today shows normal sinus rhythm at 62.  He has been working with Dr. [name] for neuropathy.  Is currently on very high dose of gabapentin (a total of 1500 mg daily).  He is tolerating apixaban without any bleeding difficulty and cholesterol has been well controlled on rosuvastatin and ezetimibe.  Only, today he is complaining of some lower extremity discomfort and minimal edema.  He says this is worse on long car rides.  He does have notable bilateral lower extremity varicosities, and would likely benefit from compression stockings.  07/11/2017  Kevin Bass was seen today in follow-up.  I last saw him about a month ago.  He was doing well without any symptomatic A. fib.  His A. fib was detected by implanted loop recorder.  He has been on anticoagulation.  Most recently he was in the emergency department for symptomatic atrial fibrillation with heart rate as high as 200 when he was seen by EMS.  He was treated and seen in the emergency department.  He was started on metoprolol and converted  back to sinus rhythm.  He had questions about the value of the implanted loop recorder, however I reminded him that his job was not to notify you of arrhythmias as they were happening.    03/07/2018  Kevin Bass returns today for follow-up.  Recently he has been reported labile blood pressure.  He notes that he is having spikes  in his blood pressure mostly in the afternoons at times up to 170 although has some lower blood pressures in the morning.  I personally reviewed his blood pressure cuff (which incidentally is close to 81 years old) and straining some lability in his blood pressures.  Currently he is taking 75 mg of metoprolol in the morning along with 50 mg of losartan and was also prescribed to take an additional hydrochlorothiazide.  He said the hydrochlorothiazide was making him dizzy and therefore he has stopped the medication.  Of note he did have a recurrent TIA affecting the right PICA territory.  There was some associated transient vertigo.  This apparently happened after holding his Eliquis for 2 days due to a scheduled median and ulnar nerve decompression.  It was felt that A. fib was the cause of this however it seems fairly unlikely that he is developed thrombus during that period of time to cause stroke.  I do believe there is likely another explanation for this cause.  I do not want him to feel that he could never come off of blood thinners because of the risk for stroke.  09/21/2018  Mr. Rugerrio was seen today in follow-up.  Overall he seems to be doing better.  Fortunately had no further strokes after that episode where he discontinued his Eliquis for 4 doses.  He has decreased his Toprol-XL down to 25 mg from 75 mg daily.  Blood pressure is well controlled and heart rate is in the low 60s.  He continues to have remote checks without any significant findings.  The plan is to allow the device to run out of battery life and then he may consider either explantation or just leaving it.  He is on Eliquis without any bleeding complications.  He last had an echo in October 2019 which showed stability of his mitral valve repair.  05/29/2019  Kevin Bass returns today for follow-up.  Overall he says he is feeling well.  Denies any chest pain or worsening shortness of breath.  Recent lipids are excellent in fact has had  a marked reduction in his LDL cholesterol.  He says it previously was because he had stopped taking his statin and was only on the Zetia due to concern for side effects however he felt like he was not having significant side effects and therefore went back on the medicine.  More recently total cholesterol is now 91, triglycerides 127, HDL 40 and LDL of 26 (reduced from 120).  Blood pressure is well controlled today.  He is blood pressure though is labile and by history his PCP has adjusted his medications.  Specifically his losartan has been decreased to 75 mg daily in the morning and metoprolol XL has decreased to 75 mg as well.  He denies any further TIA events.  He denies any bleeding difficulty on Eliquis.  PMHx:  Past Medical History:  Diagnosis Date  . Allergy   . Anxiety   . Arthritis    hands  . Atrial fibrillation ([place])   . Colon cancer [facility])    s/p partial colectomy 1990, Cscope neg 7-09, next 2014  .  DISORDER, MITRAL VALVE    MV recontruction at [place] 2006----still needs ABX for SBE prophylaxis   . Elevated PSA    prostate nodule, [name]  . GERD (gastroesophageal reflux disease)   . Heart murmur    no problems since surgery  . HYPERGLYCEMIA, BORDERLINE 10/16/2008  . HYPERLIPIDEMIA 06/15/2006   diet controlled  . Hypertension   . HYPERTROPHY PROSTATE W/O UR OBST & OTH LUTS 10/16/2008  . Neuropathy   . Right groin pain    Dr. Redmond Pulling  . SCC (squamous cell carcinoma) 06/2017   s/p Mohs---[name]  . Stroke ([place])   . Wears partial dentures    lower partial    Past Surgical History:  Procedure Laterality Date  . COLECTOMY  1990  . HERNIA REPAIR Left 2014   [facility] rep w/mesh- March 2014  . LOOP RECORDER INSERTION N/A 12/01/2016   Procedure: LOOP RECORDER INSERTION;  Surgeon: Evans Lance, MD;  Location: [place] CV LAB;  Service: Cardiovascular;  Laterality: N/A;  . MITRAL VALVE ANNULOPLASTY     [facility] 2006  . TEE WITHOUT CARDIOVERSION N/A 10/05/2016   Procedure:  TRANSESOPHAGEAL ECHOCARDIOGRAM (TEE);  Surgeon: [name], MD;  Location: [facility] ENDOSCOPY;  Service: Cardiovascular;  Laterality: N/A;    FAMHx:  Family History  Problem Relation Age of Onset  . Colon cancer Other        M side (cousins)  . Diabetes Neg Hx   . Stroke Neg Hx   . CAD Neg Hx   . Prostate cancer Neg Hx   . Rectal cancer Neg Hx   . Esophageal cancer Neg Hx     SOCHx:   reports that he quit smoking about 36 years ago. His smoking use included cigarettes. He has a 10.00 pack-year smoking history. He has never used smokeless tobacco. He reports previous alcohol use of about 7.0 standard drinks of alcohol per week. He reports that he does not use drugs.  ALLERGIES:  Allergies  Allergen Reactions  . Statins Other (See Comments)    Myalgias (intolerance)    ROS: Pertinent items noted in HPI and remainder of comprehensive ROS otherwise negative.  HOME MEDS: Current Outpatient Medications on File Prior to Visit  Medication Sig Dispense Refill  . ALPRAZolam (XANAX) 0.25 MG tablet TAKE 1 TABLET 2 TABLET BY MOUTH TWICE DAILY AS NEEDED FOR ANXIETY 40 tablet 1  . amLODipine (NORVASC) 5 MG tablet Take 1 tablet (5 mg total) by mouth daily. 30 tablet 6  . Arginine 500 MG CAPS Take 1,000 mg by mouth daily.     Marland Kitchen ascorbic acid (VITAMIN C) 500 MG/5ML syrup Take 1,000 mg by mouth 2 (two) times daily.    Marland Kitchen ELIQUIS 5 MG TABS tablet TAKE 1 TABLET BY MOUTH TWICE DAILY 60 tablet 4  . ezetimibe (ZETIA) 10 MG tablet Take 1 tablet (10 mg total) by mouth daily. 90 tablet 3  . gabapentin (NEURONTIN) 300 MG capsule Take 2 capsules (300mg ) in the AM and 3 capsules (900mg ) in the evening  0  . Glucosamine-MSM-Hyaluronic Acd (JOINT HEALTH PO) Take 1 tablet by mouth daily. JARROW JOINT BUILDER    . loratadine (CLARITIN) 10 MG tablet Take 10 mg by mouth at bedtime.     Marland Kitchen losartan (COZAAR) 100 MG tablet Take 1 tablet (100 mg total) by mouth daily. 30 tablet 6  . Lycopene 10 MG CAPS Take 1  capsule by mouth 2 (two) times daily. JARROW FORMULA LYCO-SORB PROSTATE SUPPORT     .  metoprolol succinate (TOPROL-XL) 25 MG 24 hr tablet Take 3 tablets (75 mg total) by mouth daily. 270 tablet 1  . Multiple Vitamins-Minerals (OCUVITE PRESERVISION PO) Take 1 tablet by mouth 2 (two) times daily.     . Omega-3 Fatty Acids (FISH OIL PO) Take 1,600 mg by mouth 2 (two) times daily.     Marland Kitchen omeprazole (PRILOSEC) 40 MG capsule Take 1 capsule (40 mg total) by mouth daily. 90 capsule 3  . rosuvastatin (CRESTOR) 40 MG tablet Take 1 tablet (40 mg total) by mouth daily. 90 tablet 1   No current facility-administered medications on file prior to visit.    LABS/IMAGING: No results found for this or any previous visit (from the past 48 hour(s)). No results found.  LIPID PANEL:    Component Value Date/Time   CHOL 91 12/11/2018 1101   TRIG 127.0 12/11/2018 1101   HDL 40.30 12/11/2018 1101   CHOLHDL 2 12/11/2018 1101   VLDL 25.4 12/11/2018 1101   LDLCALC 26 12/11/2018 1101   LDLDIRECT 149.1 08/30/2012 1352    WEIGHTS: Wt Readings from Last 3 Encounters:  05/29/19 182 lb (82.6 kg)  05/28/19 182 lb 3.2 oz (82.6 kg)  12/11/18 186 lb (84.4 kg)    VITALS: BP 130/68   Pulse (!) 58   Temp 97.9 F (36.6 C)   Ht 5\' 10"  (1.778 m)   Wt 182 lb (82.6 kg)   SpO2 99%   BMI 26.11 kg/m   EXAM: General appearance: alert and no distress Neck: no carotid bruit, no JVD and thyroid not enlarged, symmetric, no tenderness/mass/nodules Lungs: clear to auscultation bilaterally Heart: regular rate and rhythm, S1, S2 normal, no murmur, click, rub or gallop Abdomen: soft, non-tender; bowel sounds normal; no masses,  no organomegaly Extremities: extremities normal, atraumatic, no cyanosis or edema and varicose veins noted Pulses: 2+ and symmetric Skin: Skin color, texture, turgor normal. No rashes or lesions Neurologic: Grossly normal Psych: Pleasant  EKG: Sinus bradycardia at 58 -personally  reviewed  ASSESSMENT: 1. Labile blood pressure 2. Recurrent stroke 3. Status post mitral valve repair 4. A-fib - S/p ILR 5. CHADSVASC Score of 4 - on Eliquis 6. Essential hypertension 7. Dyslipidemia  PLAN: 1.   Mr. Ju seems to be doing well on Eliquis.  There has been no further TIA events.  His blood pressure has been labile however recently his meds were adjusted.  He seems to be at goal today.  His cholesterol is actually very well treated if not more than it needs to be.  I advised him to stop his Zetia since he is tolerating high potency statin.  This should be rechecked in 3 to 6 months.  No other changes to his medicines today.  Follow-up with me annually or sooner as necessary.  [name], MD, [facility], [place] Director of the Advanced Lipid Disorders &  Cardiovascular Risk Reduction Clinic Diplomate of the American Board of Clinical Lipidology Attending Cardiologist  Direct Dial: [phone]  Fax: [phone]  Website:  [email or web address]  Nadean Corwin Atziry Baranski 05/29/2019, 2:18 PM

## 2019-05-29 NOTE — Patient Instructions (Signed)
Medication Instructions:  Your physician has recommended you make the following change in your medication: STOP zetia   *If you need a refill on your cardiac medications before your next appointment, please call your pharmacy*   Lab Work: NONE If you have labs (blood work) drawn today and your tests are completely normal, you will receive your results only by: Marland Kitchen MyChart Message (if you have MyChart) OR . A paper copy in the mail If you have any lab test that is abnormal or we need to change your treatment, we will call you to review the results.   Testing/Procedures: NONE   Follow-Up: At [facility], you and your health needs are our priority.  As part of our continuing mission to provide you with exceptional heart care, we have created designated Provider Care Teams.  These Care Teams include your primary Cardiologist (physician) and Advanced Practice Providers (APPs -  Physician Assistants and Nurse Practitioners) who all work together to provide you with the care you need, when you need it.  We recommend signing up for the patient portal called "MyChart".  Sign up information is provided on this After Visit Summary.  MyChart is used to connect with patients for Virtual Visits (Telemedicine).  Patients are able to view lab/test results, encounter notes, upcoming appointments, etc.  Non-urgent messages can be sent to your provider as well.   To learn more about what you can do with MyChart, go to [email or web address].    Your next appointment:   12 month(s)  The format for your next appointment:   In Person  Provider:   You may see Dr. Lyman Bishop or one of the following Advanced Practice Providers on your designated Care Team:    Almyra Deforest, PA-C  Fabian Sharp, [place] or   Roby Lofts, [place]    Other Instructions

## 2019-05-30 ENCOUNTER — Encounter: Payer: Self-pay | Admitting: Internal Medicine

## 2019-06-06 DIAGNOSIS — L570 Actinic keratosis: Secondary | ICD-10-CM | POA: Diagnosis not present

## 2019-06-11 ENCOUNTER — Ambulatory Visit: Payer: PPO | Admitting: Internal Medicine

## 2019-06-14 ENCOUNTER — Encounter: Payer: Self-pay | Admitting: Internal Medicine

## 2019-06-14 ENCOUNTER — Ambulatory Visit (INDEPENDENT_AMBULATORY_CARE_PROVIDER_SITE_OTHER): Payer: PPO | Admitting: Internal Medicine

## 2019-06-14 ENCOUNTER — Other Ambulatory Visit: Payer: Self-pay

## 2019-06-14 VITALS — BP 150/62 | HR 56 | Temp 96.9°F | Resp 18 | Ht 70.0 in | Wt 182.2 lb

## 2019-06-14 DIAGNOSIS — I10 Essential (primary) hypertension: Secondary | ICD-10-CM | POA: Diagnosis not present

## 2019-06-14 DIAGNOSIS — G500 Trigeminal neuralgia: Secondary | ICD-10-CM

## 2019-06-14 DIAGNOSIS — E785 Hyperlipidemia, unspecified: Secondary | ICD-10-CM | POA: Diagnosis not present

## 2019-06-14 DIAGNOSIS — N400 Enlarged prostate without lower urinary tract symptoms: Secondary | ICD-10-CM

## 2019-06-14 LAB — BASIC METABOLIC PANEL
BUN: 22 mg/dL (ref 6–23)
CO2: 34 mEq/L — ABNORMAL HIGH (ref 19–32)
Calcium: 9.1 mg/dL (ref 8.4–10.5)
Chloride: 103 mEq/L (ref 96–112)
Creatinine, Ser: 1.08 mg/dL (ref 0.40–1.50)
GFR: 65.65 mL/min (ref >60.00)
Glucose, Bld: 82 mg/dL (ref 70–99)
Potassium: 4.6 mEq/L (ref 3.5–5.1)
Sodium: 140 mEq/L (ref 135–145)

## 2019-06-14 NOTE — Patient Instructions (Addendum)
Please schedule Medicare Wellness with Glenard Haring.   Take your medications for blood pressure regularly. If your blood pressure is extremely high or extremely low let us know and try to avoid  frequent changes on your medications.  BP GOAL is between 110/65 and  140/85. If it is consistently higher or lower, let me know    GO TO THE LAB : Get the blood work     [place], [place] back for a checkup in 4 to 5 months

## 2019-06-14 NOTE — Progress Notes (Signed)
Pre visit review using our clinic review tool, if applicable. No additional management support is needed unless otherwise documented below in the visit note. 

## 2019-06-14 NOTE — Progress Notes (Signed)
Subjective:    Patient ID: Kevin Bass, male    DOB: [birth year], 79 y.o.   MRN: [id number]  DOS:  06/14/2019 Type of visit - description: Routine visit  Notes from cardiology and neurology reviewed We talk about his blood pressure.   Review of Systems See above   Past Medical History:  Diagnosis Date  . Allergy   . Anxiety   . Arthritis    hands  . Atrial fibrillation ([place])   . Colon cancer [facility])    s/p partial colectomy 1990, Cscope neg 7-09, next 2014  . DISORDER, MITRAL VALVE    MV recontruction at [place] 2006----still needs ABX for SBE prophylaxis   . Elevated PSA    prostate nodule, [name]  . GERD (gastroesophageal reflux disease)   . Heart murmur    no problems since surgery  . HYPERGLYCEMIA, BORDERLINE 10/16/2008  . HYPERLIPIDEMIA 06/15/2006   diet controlled  . Hypertension   . HYPERTROPHY PROSTATE W/O UR OBST & OTH LUTS 10/16/2008  . Neuropathy   . Right groin pain    Dr. Redmond Pulling  . SCC (squamous cell carcinoma) 06/2017   s/p Mohs---[name]  . Stroke ([place])   . Wears partial dentures    lower partial    Past Surgical History:  Procedure Laterality Date  . COLECTOMY  1990  . HERNIA REPAIR Left 2014   [facility] rep w/mesh- March 2014  . LOOP RECORDER INSERTION N/A 12/01/2016   Procedure: LOOP RECORDER INSERTION;  Surgeon: Evans Lance, MD;  Location: [place] CV LAB;  Service: Cardiovascular;  Laterality: N/A;  . MITRAL VALVE ANNULOPLASTY     [facility] 2006  . TEE WITHOUT CARDIOVERSION N/A 10/05/2016   Procedure: TRANSESOPHAGEAL ECHOCARDIOGRAM (TEE);  Surgeon: [name], MD;  Location: [facility] ENDOSCOPY;  Service: Cardiovascular;  Laterality: N/A;    Allergies as of 06/14/2019      Reactions   Statins Other (See Comments)   Myalgias (intolerance)      Medication List       Accurate as of June 14, 2019  1:33 PM. If you have any questions, ask your nurse or doctor.        ALPRAZolam 0.25 MG tablet Commonly known as: XANAX TAKE 1 TABLET 2  TABLET BY MOUTH TWICE DAILY AS NEEDED FOR ANXIETY   amLODipine 5 MG tablet Commonly known as: NORVASC Take 1 tablet (5 mg total) by mouth daily.   Arginine 500 MG Caps Take 1,000 mg by mouth daily.   ascorbic acid 500 MG/5ML syrup Commonly known as: VITAMIN C Take 1,000 mg by mouth 2 (two) times daily.   Eliquis 5 MG Tabs tablet Generic drug: apixaban TAKE 1 TABLET BY MOUTH TWICE DAILY   FISH OIL PO Take 1,600 mg by mouth 2 (two) times daily.   gabapentin 300 MG capsule Commonly known as: NEURONTIN Take 2 capsules (300mg ) in the AM and 3 capsules (900mg ) in the evening   JOINT HEALTH PO Take 1 tablet by mouth daily. JARROW JOINT BUILDER   loratadine 10 MG tablet Commonly known as: CLARITIN Take 10 mg by mouth at bedtime.   losartan 100 MG tablet Commonly known as: COZAAR Take 1 tablet (100 mg total) by mouth daily.   Lycopene 10 MG Caps Take 1 capsule by mouth 2 (two) times daily. JARROW FORMULA LYCO-SORB PROSTATE SUPPORT   metoprolol succinate 25 MG 24 hr tablet Commonly known as: TOPROL-XL Take 3 tablets (75 mg total) by mouth daily.   OCUVITE  PRESERVISION PO Take 1 tablet by mouth 2 (two) times daily.   omeprazole 40 MG capsule Commonly known as: PRILOSEC Take 1 capsule (40 mg total) by mouth daily.   rosuvastatin 40 MG tablet Commonly known as: CRESTOR Take 1 tablet (40 mg total) by mouth daily.          Objective:   Physical Exam BP (!) 150/62 (BP Location: Left Arm, Patient Position: Sitting, Cuff Size: Small)   Pulse (!) 56   Temp (!) 96.9 F (36.1 C) (Temporal)   Resp 18   Ht 5\' 10"  (1.778 m)   Wt 182 lb 4 oz (82.7 kg)   SpO2 100%   BMI 26.15 kg/m  General:   Well developed, NAD, BMI noted. HEENT:  Normocephalic . Face symmetric, atraumatic Lungs:  CTA B Normal respiratory effort, no intercostal retractions, no accessory muscle use. Heart: Bradycardic, regular. Lower extremities: no pretibial edema bilaterally  Skin: Not pale. Not  jaundice Neurologic:  alert & oriented X3.  Speech normal, gait appropriate for age and unassisted Psych--  Cognition and judgment appear intact.  Cooperative with normal attention span and concentration.  Behavior appropriate. No anxious or depressed appearing.      Assessment    Assessment Prediabetes HTN Hyperlipidemia, statin intolerant (simva-pravachol: shoulder pain) Anxiety- rarely takes xanax Neuro  --Facial neuropathy dx 2015, no w/u, sx resurface x1 after temporary d/c of meds  --saw neuro again 02/2017, labs (-), had a NCS, Rx gaba CV: --Mitral valve annuloplasty 2006-- needs SBE prophylaxis -- SOB 04-2016, seen at [facility], had a ECHO Nl fx MV, cath: no CAD --Cryptogenic stroke 09/12/2016, had slurred speech. 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 [name] Colon cancer partial colectomy 1990, colonoscopy 08-2007 negative, Cscope again 09-2012 normal, 5 years  SCC . MOHS 06/2017  PLAN:   HTN Saw cardiology 05/29/2019, at the time BP was acceptable, impression was labile HTN, meds were not changed.  The patient adjust his losartan dose according to the blood pressure of the day, at some point BP was 90/50 and he consistently decreased losartan dose to 75 mg daily. Sometimes skip amlodipine. Good compliance with metoprolol Plan: Continue medications but avoid frequent changes in doses, check a BMP, call if not at goal.  See AVS. High cholesterol: Saw cardiology recently, recommended to [address] because he is tolerated statins. They plan to recheck a FLP in few months.  BPH: Last visit with urology 12-2017, then recommend 1 additional visit and then follow-up prn; he elected to see them one more time, he will set his own appointment. Trigeminal  neuralgia: Last visit with neurology 05/28/2019, symptoms currently controlled with gabapentin Preventive care: Had Covid shots RTC 4 months   Time spent today 32 minutes, talking about the need to take blood pressure medications regularly, reviewing notes from other physicians.  This visit occurred during the SARS-CoV-2 public health emergency.  Safety protocols were in place, including screening questions prior to the visit, additional usage of staff PPE, and extensive cleaning of exam room while observing appropriate contact time as indicated for disinfecting solutions.

## 2019-06-16 NOTE — Assessment & Plan Note (Signed)
HTN Saw cardiology 05/29/2019, at the time BP was acceptable, impression was labile HTN, meds were not changed.  The patient adjust his losartan dose according to the blood pressure of the day, at some point BP was 90/50 and he consistently decreased losartan dose to 75 mg daily. Sometimes skip amlodipine. Good compliance with metoprolol Plan: Continue medications but avoid frequent changes in doses, check a BMP, call if not at goal.  See AVS. High cholesterol: Saw cardiology recently, recommended to [place] because he is tolerated statins. They plan to recheck a FLP in few months.  BPH: Last visit with urology 12-2017, then recommend 1 additional visit and then follow-up prn; he elected to see them one more time, he will set his own appointment. Trigeminal neuralgia: Last visit with neurology 05/28/2019, symptoms currently controlled with gabapentin Preventive care: Had Covid shots RTC 4 months

## 2019-07-03 ENCOUNTER — Ambulatory Visit: Payer: PPO | Admitting: Neurology

## 2019-07-16 DIAGNOSIS — H5711 Ocular pain, right eye: Secondary | ICD-10-CM | POA: Diagnosis not present

## 2019-07-17 ENCOUNTER — Telehealth: Payer: Self-pay | Admitting: Neurology

## 2019-07-17 NOTE — Telephone Encounter (Signed)
Pt called he will come in Thursday at 11:30 to see Dr [name] .,

## 2019-07-17 NOTE — Telephone Encounter (Signed)
I have an opening on Thurs at 11:30am if he wants to come in then, thanks

## 2019-07-17 NOTE — Telephone Encounter (Signed)
Patient called in requesting an earlier appointment with Dr. [name]. He is currently scheduled for 10/31/19. He has been having some right eye problems. He has pain over his right eyebrow and the eye itself. When he puts his hand about a foot away from his eye he feels vibrations that feels like an electric shock.

## 2019-07-17 NOTE — Telephone Encounter (Signed)
Pls let him know I reviewed notes from his eye doctor visit yesterday, it looks like they prescribed eye drops for his eye pain. Has he started them? Does the pain feel like his trigeminal neuralgia? If it is, we can just increase the dose of his gabapentin.

## 2019-07-17 NOTE — Telephone Encounter (Signed)
Pt has not started the eye drops yet they are to expensive $400, eye dr looking for drops coved by insurance that will help, stated that it does feel a little like the trigeminal neuralgia, he stated that he is unsure if he wanted to increase his gabapentin he doesn't think he will be able to function if its any higher

## 2019-07-19 ENCOUNTER — Encounter: Payer: Self-pay | Admitting: Neurology

## 2019-07-19 ENCOUNTER — Other Ambulatory Visit: Payer: Self-pay

## 2019-07-19 ENCOUNTER — Ambulatory Visit (INDEPENDENT_AMBULATORY_CARE_PROVIDER_SITE_OTHER): Payer: PPO | Admitting: Neurology

## 2019-07-19 VITALS — BP 114/77 | HR 56 | Resp 20 | Ht 70.0 in | Wt 184.0 lb

## 2019-07-19 DIAGNOSIS — G500 Trigeminal neuralgia: Secondary | ICD-10-CM

## 2019-07-19 NOTE — Progress Notes (Deleted)
NEUROLOGY FOLLOW UP OFFICE NOTE  Kevin Bass [id number] [birth year]  HISTORY OF PRESENT ILLNESS: I had the pleasure of seeing *** in follow-up in the neurology clinic on ***.  The patient was last seen on *** and is accompanied by *** today.  Records and images were personally reviewed where available.  ***.  I had the pleasure of seeing Kevin Bass in follow-up in the neurology clinic on 05/28/2019.  The patient was last seen 6 months ago for recurrent strokes (x3) and neuropathy. He presents today due to a flare up of trigeminal neuralgia. He has been on gabapentin for this and was instructed 4 days ago to increase dose to 637m in AM, 9066min PM. He feels a little better, he mostly feels pain when he wakes up in the morning. The pain started on the top of his head, down his forehead and right side of his eye. When he touches his temple in the morning, he feels it, but the past 2 days have been quiet. When he called our office, even touching his hair would cause pain. He reports having dental work on the right side a couple of weeks ago and wonders if this triggered current flare. He has changed gabapentin intake to 30037mn AM, 300m26m noon, 900mg6m. He denies any focal weakness. Neuropathy has good and bad days, he denies any pain/burning/stabbing, he mostly notices his right hand or leg/foot gets cold. He denies any dizziness, vision changes, no falls. Sleep is good. His memory is not as good as he would like it to be, he denies getting lost driving, no missed bills or medications.   He has been having some right eye problems. He has pain over his right eyebrow and the eye itself. When he puts his hand about a foot away from his eye he feels vibrations that feels like an electric shock.  History on Initial Assessment 10/01/2016: This is a pleasant 76 yo15H man with a history of colon cancer, hyperlipidemia, previous mitral valve repair in his usual state of health until 09/11/16  after taking a walk, he saw his brother-in-law and when he started noticing word-finding difficulties. His wife reported that his speech was not necessarily slurred but he was having trouble finding his words. When symptoms were unchanged the next day, he decided to go to the ER. He was still noted to have mild word-finding difficulties. There was no focal numbness/tingling/weakness. He denied any headaches, dizziness, diplopia, dysarthria/dysphagia. I personally reviewed MRI brain without contrast which showed an acute infarct in the left posterior insula and parietal operculum. MRA head and carotid dopplers did not show any significant stenosis. Echocardiogram showed EF 60-65%, mild LVH, prior MV repair without significant stenosis or regurgitation, normal LA size. He was switched from aspirin to Plavix. LDL was 124, HbA1c was 5.6. He was unable to get a TEE in the hospital, and has seen Dr. HiltyDebara Pickettagreed to having the procedure done on 8/14 followed by loop recorder. He denies any palpitations, chest pain, shortness of breath. No neck/back pain, bowel/bladder dysfunction. No falls. He denies any side effect on Plavix. He was started on Crestor yesterday. He feels he is 95% or more back to baseline, he denies any confusion or focal symptoms. He reports a history of facial pain on the right upper lip radiating up his cheek with good response to gabapentin. He takes 300mg 45mwith no side effects.  Update 12/07/16: He had no residual deficits from prior stroke,  then on 11/29/16 he had sudden onset vertigo that made him fall backward. This lasted for 15 seconds and was followed by a mild headache. He called triage and was instructed to go seek medical attention the next day. He went to the ER and was found to have a right PICA territory stroke. He was evaluated by Neurology with no focal deficits seen. He reports he was taking aspirin and Plavix when the stroke occurred. He had an extensive workup for the prior  stroke, he had a TEE in August 2018 which showed normal left atrium, no thrombus seen. He had a 48-hour monitor which was unremarkable. In the hospital, he had a CTA of the head and neck which did not show any occlusion or stenosis. Repeat TTE showed EF of 55-60%, left atrium mildly dilated. He had a CT chest/abdomen/pelvis which did not show any evidence of malignancy. On hospital discharge, he was instructed to continue aspirin and Plavix and stop his antihypertensives. He had a loop recorder placed.  Update 12/06/2017: He underwent right median nerve and ulnar nerve decompression on 11/28/17. He had stopped the Eliquis for 2 days for the procedure, and restarted Eliquis on 11/29/17. On 12/02/17, he took a nap and woke up with right arm weakness and numbness. He could not lift his right arm up. After 20 minutes, he started getting more feeling in his arm. Per records, he stood up and noticed his gait was off. The weakness improved after several minutes, but he continued to feel off balance. He went to the ER where SBP was elevated at 170. NIH 0 in the hospital. Records were reviewed, he had an MRI brain without contrast showing an acute infarct in the left hippocampus, occlusion of the left PCA which could be acute. There were chronic infarcts in the right cerebellum and left parietal operculum. He had an echo which showed an EF of 55-60%, left atrium normal, right atrium moderately dilated. No significant stenosis on carotid dopplers. LDL in August 2019 was 120, he was advised to increase Crestor to 72m daily. He was having muscle pains and stopped statin for 5 months, he was restarted Crestor with Zetia last August.  He was discharged home 2 days ago and reports that this is the worst of his strokes. It has affected his memory more than anything. He has also noticed vision changes on the right visual field, he describes blurred/double vision reading on his right side. He feels his strength is back but his  balance is still off. He also has left foot pain. He has been dealing with his wife and sister's recent deaths.   Diagnostic Data: Neuropathy labs were normal (TSH, B12, ESR, folate, SPEP/IFE). He had an EMG/NCV of the right UE and LE which showed right ulnar neuropathy with slowing across the elbow, axon loss and demyelinating in type, mild to moderate in degree; right median neuropathy at or distal to the wrist, consistent with carpal tunnel, mild in degree, and probable L5 radiculopathy, mild. No evidence of a large fiber sensorimotor polyneuropathy.     PAST MEDICAL HISTORY: Past Medical History:  Diagnosis Date   Allergy    Anxiety    Arthritis    hands   Atrial fibrillation ([facility]    [place]    s/p partial colectomy 1990, Cscope neg 7-09, next 2014   DISORDER, MITRAL VALVE    [place]still needs [place] for SBE prophylaxis    Elevated PSA    prostate  nodule, [name]   GERD (gastroesophageal reflux disease)    Heart murmur    no problems since surgery   HYPERGLYCEMIA, BORDERLINE 10/16/2008   HYPERLIPIDEMIA 06/15/2006   diet controlled   Hypertension    HYPERTROPHY PROSTATE W/O UR OBST & OTH LUTS 10/16/2008   Neuropathy    Right groin pain    Dr. Redmond Pulling   SCC (squamous cell carcinoma) 06/2017   s/p Mohs---[name]   Stroke [facility])    Wears partial dentures    lower partial    MEDICATIONS: Current Outpatient Medications on File Prior to Visit  Medication Sig Dispense Refill   ALPRAZolam (XANAX) 0.25 MG tablet TAKE 1 TABLET 2 TABLET BY MOUTH TWICE DAILY AS NEEDED FOR ANXIETY 40 tablet 1   amLODipine (NORVASC) 5 MG tablet Take 1 tablet (5 mg total) by mouth daily. 30 tablet 6   Arginine 500 MG CAPS Take 1,000 mg by mouth daily.      ascorbic acid (VITAMIN C) 500 MG/5ML syrup Take 1,000 mg by mouth 2 (two) times daily.     ELIQUIS 5 MG TABS tablet TAKE 1 TABLET BY MOUTH TWICE DAILY 60 tablet 4   fluorometholone  (FML) 0.1 % ophthalmic suspension Place 1 drop into the right eye 3 times daily for 7 days.     gabapentin (NEURONTIN) 300 MG capsule Take 2 capsules (336m) in the AM and 3 capsules (9088m in the evening  0   Glucosamine-MSM-Hyaluronic Acd (JOINT HEALTH PO) Take 1 tablet by mouth daily. JARROW JOINT BUILDER     loratadine (CLARITIN) 10 MG tablet Take 10 mg by mouth at bedtime.      losartan (COZAAR) 100 MG tablet 75 mg a day     Lycopene 10 MG CAPS Take 1 capsule by mouth 2 (two) times daily. JARROW FORMULA LYCO-SORB PROSTATE SUPPORT      metoprolol succinate (TOPROL-XL) 25 MG 24 hr tablet Take 3 tablets (75 mg total) by mouth daily. 270 tablet 1   Multiple Vitamins-Minerals (OCUVITE PRESERVISION PO) Take 1 tablet by mouth 2 (two) times daily.      Omega-3 Fatty Acids (FISH OIL PO) Take 1,600 mg by mouth 2 (two) times daily.      omeprazole (PRILOSEC) 40 MG capsule Take 1 capsule (40 mg total) by mouth daily. 90 capsule 3   prednisoLONE acetate (PRED FORTE) 1 % ophthalmic suspension Place 1 drop into the right eye 2 times daily for 7 days.     rosuvastatin (CRESTOR) 40 MG tablet Take 1 tablet (40 mg total) by mouth daily. 90 tablet 1   No current facility-administered medications on file prior to visit.    ALLERGIES: Allergies  Allergen Reactions   Statins Other (See Comments)    Myalgias (intolerance)    FAMILY HISTORY: Family History  Problem Relation Age of Onset   Colon cancer Other        M side (cousins)   Diabetes Neg Hx    Stroke Neg Hx    CAD Neg Hx    Prostate cancer Neg Hx    Rectal cancer Neg Hx    Esophageal cancer Neg Hx     SOCIAL HISTORY: Social History   Socioeconomic History   Marital status: Widowed    Spouse name: suzanne   Number of children: 2   Years of education: college   Highest education level: Not on file  Occupational History   Occupation: retired   Tobacco Use   Smoking status: Former Smoker  Packs/day: 0.50      Years: 20.00    Pack years: 10.00    Types: Cigarettes    Quit date: 02/23/1983    Years since quitting: 36.4   Smokeless tobacco: Never Used  Substance and Sexual Activity   Alcohol use: Not Currently    Alcohol/week: 7.0 standard drinks    Types: 7 Glasses of wine per week   Drug use: No   Sexual activity: Not Currently  Other Topics Concern   Not on file  Social History Narrative   From [place], lost wife 08/2017 to cancer   Daughter in [place], son in [place]   Right handed      Social Determinants of Health   Financial Resource Strain:    Difficulty of Paying Living Expenses:   Food Insecurity:    Worried About Charity fundraiser in the Last Year:    Arboriculturist in the Last Year:   Transportation Needs:    Film/video editor (Medical):    Lack of Transportation (Non-Medical):   Physical Activity:    Days of Exercise per Week:    Minutes of Exercise per Session:   Stress:    Feeling of Stress :   Social Connections:    Frequency of Communication with Friends and Family:    Frequency of Social Gatherings with Friends and Family:    Attends Religious Services:    Active Member of Clubs or Organizations:    Attends Music therapist:    Marital Status:   Intimate Partner Violence:    Fear of Current or Ex-Partner:    Emotionally Abused:    Physically Abused:    Sexually Abused:     REVIEW OF SYSTEMS: Constitutional: No fevers, chills, or sweats, no generalized fatigue, change in appetite Eyes: No visual changes, double vision, eye pain Ear, nose and throat: No hearing loss, ear pain, nasal congestion, sore throat Cardiovascular: No chest pain, palpitations Respiratory:  No shortness of breath at rest or with exertion, wheezes GastrointestinaI: No nausea, vomiting, diarrhea, abdominal pain, fecal incontinence Genitourinary:  No dysuria, urinary retention or frequency Musculoskeletal:  No neck pain, back pain Integumentary:  No rash, pruritus, skin lesions Neurological: as above Psychiatric: No depression, insomnia, anxiety Endocrine: No palpitations, fatigue, diaphoresis, mood swings, change in appetite, change in weight, increased thirst Hematologic/Lymphatic:  No anemia, purpura, petechiae. Allergic/Immunologic: no itchy/runny eyes, nasal congestion, recent allergic reactions, rashes  PHYSICAL EXAM: Vitals:   07/19/19 1139  BP: 114/77  Pulse: (!) 56  Resp: 20  SpO2: 100%   General: No acute distress Head:  Normocephalic/atraumatic Neck: supple, no paraspinal tenderness, full range of motion Heart:  Regular rate and rhythm Lungs:  Clear to auscultation bilaterally Back: No paraspinal tenderness Skin/Extremities: No rash, no edema Neurological Exam: alert and oriented to person, place, and time. No aphasia or dysarthria. Fund of knowledge is appropriate.  Recent and remote memory are intact.  Attention and concentration are normal.    Able to name objects and repeat phrases. Cranial nerves: Pupils equal, round, reactive to light.  Fundoscopic exam unremarkable, no papilledema. Extraocular movements intact with no nystagmus. Visual fields full. Facial sensation intact. No facial asymmetry. Tongue, uvula, palate midline.  Motor: Bulk and tone normal, muscle strength 5/5 throughout with no pronator drift.  Sensation to light touch, temperature and vibration intact.  No extinction to double simultaneous stimulation.  Deep tendon reflexes 2+ throughout, toes downgoing.  Finger to nose testing intact.  Gait narrow-based  and steady, able to tandem walk adequately.  Romberg negative.  IMPRESSION: This is a pleasant 79 yo RH man with a history of colon cancer, hyperlipidemia, previous mitral valve repair with recurrent strokes (x3) secondary to atrial fibrillation, no residual deficits. He presents today for trigeminal neuralgia, pain improved with increase in gabapentin dose, continue 367m in AM, 3024mat noon, [id number].Gabapentin also helps with neuropathy. Continue Eliquis and control of vascular risk factors for secondary stroke prevention. He knows to go to the ER for any sudden change in symptoms. Follow-up in 5 months, he knows to call for any changes.   Thank you for allowing me to participate in *** care.  Please do not hesitate to call for any questions or concerns.  The duration of this appointment visit was *** minutes of face-to-face time with the patient.  Greater than 50% of this time was spent in counseling, explanation of diagnosis, planning of further management, and coordination of care.   KarEllouise Newer.D.   CC: ***

## 2019-07-19 NOTE — Progress Notes (Signed)
NEUROLOGY FOLLOW UP OFFICE NOTE  Kevin Bass [id number] 21-Apr-1940  HISTORY OF PRESENT ILLNESS: I had the pleasure of seeing Kevin Bass in follow-up in the neurology clinic on 07/19/2019.  The patient was last seen 6 weeks ago for trigeminal neuralgia. He is taking gabapentin 369m in AM, 3032mat noon, 90056mhs. He was initially seen in our office for recurrent strokes (x3) and neuropathy. He presents today due to concern for right eye/forehead pain for the past 10 days. He has sharp pains/electric shock over the right brow. He states even his right eyeball hurts. Sometimes he cannot touch the area, or even putting his had a foot away from his eye makes him feel electric shock vibrations. He has to be careful when washing his face. When he chews, there is shooting pain radiating up his forehead. He has redness and small scabs on the right frontotemporal region which he states happened after he had seen his surgeon who prescribed an unrecalled cream. He reports that around 2.5 years ago he had "some kind of cancer" in this area and had seen the surgeon, so when pain recurred, he went to see the surgeon again to ensure no cancer recurrence and was prescribed cream. He denies any vesicles, no tenderness today. He feels his right hand is cold all the time. No falls.   History on Initial Assessment 10/01/2016: This is a pleasant [age] RH man with a history of colon cancer, hyperlipidemia, previous mitral valve repair in his usual state of health until 09/11/16 after taking a walk, he saw his brother-in-law and when he started noticing word-finding difficulties. His wife reported that his speech was not necessarily slurred but he was having trouble finding his words. When symptoms were unchanged the next day, he decided to go to the ER. He was still noted to have mild word-finding difficulties. There was no focal numbness/tingling/weakness. He denied any headaches, dizziness, diplopia,  dysarthria/dysphagia. I personally reviewed MRI brain without contrast which showed an acute infarct in the left posterior insula and parietal operculum. MRA head and carotid dopplers did not show any significant stenosis. Echocardiogram showed EF 60-65%, mild LVH, prior MV repair without significant stenosis or regurgitation, normal LA size. He was switched from aspirin to Plavix. LDL was 124, HbA1c was 5.6. He was unable to get a TEE in the hospital, and has seen Dr. HilDebara Pickettd agreed to having the procedure done on 8/14 followed by loop recorder. He denies any palpitations, chest pain, shortness of breath. No neck/back pain, bowel/bladder dysfunction. No falls. He denies any side effect on Plavix. He was started on Crestor yesterday. He feels he is 95% or more back to baseline, he denies any confusion or focal symptoms. He reports a history of facial pain on the right upper lip radiating up his cheek with good response to gabapentin. He takes 300m48ms with no side effects.  Update 12/07/16: He had no residual deficits from prior stroke, then on 11/29/16 he had sudden onset vertigo that made him fall backward. This lasted for 15 seconds and was followed by a mild headache. He called triage and was instructed to go seek medical attention the next day. He went to the ER and was found to have a right PICA territory stroke. He was evaluated by Neurology with no focal deficits seen. He reports he was taking aspirin and Plavix when the stroke occurred. He had an extensive workup for the prior stroke, he had a TEE in August 2018 which  showed normal left atrium, no thrombus seen. He had a 48-hour monitor which was unremarkable. In the hospital, he had a CTA of the head and neck which did not show any occlusion or stenosis. Repeat TTE showed EF of 55-60%, left atrium mildly dilated. He had a CT chest/abdomen/pelvis which did not show any evidence of malignancy. On hospital discharge, he was instructed to continue aspirin  and Plavix and stop his antihypertensives. He had a loop recorder placed.  Update 12/06/2017: He underwent right median nerve and ulnar nerve decompression on 11/28/17. He had stopped the Eliquis for 2 days for the procedure, and restarted Eliquis on 11/29/17. On 12/02/17, he took a nap and woke up with right arm weakness and numbness. He could not lift his right arm up. After 20 minutes, he started getting more feeling in his arm. Per records, he stood up and noticed his gait was off. The weakness improved after several minutes, but he continued to feel off balance. He went to the ER where SBP was elevated at 170. NIH 0 in the hospital. Records were reviewed, he had an MRI brain without contrast showing an acute infarct in the left hippocampus, occlusion of the left PCA which could be acute. There were chronic infarcts in the right cerebellum and left parietal operculum. He had an echo which showed an EF of 55-60%, left atrium normal, right atrium moderately dilated. No significant stenosis on carotid dopplers. LDL in August 2019 was 120, he was advised to increase Crestor to 56m daily. He was having muscle pains and stopped statin for 5 months, he was restarted Crestor with Zetia last August.  He was discharged home 2 days ago and reports that this is the worst of his strokes. It has affected his memory more than anything. He has also noticed vision changes on the right visual field, he describes blurred/double vision reading on his right side. He feels his strength is back but his balance is still off. He also has left foot pain. He has been dealing with his wife and sister's recent deaths.   Diagnostic Data: Neuropathy labs were normal (TSH, B12, ESR, folate, SPEP/IFE). He had an EMG/NCV of the right UE and LE which showed right ulnar neuropathy with slowing across the elbow, axon loss and demyelinating in type, mild to moderate in degree; right median neuropathy at or distal to the wrist, consistent with  carpal tunnel, mild in degree, and probable L5 radiculopathy, mild. No evidence of a large fiber sensorimotor polyneuropathy.     PAST MEDICAL HISTORY: Past Medical History:  Diagnosis Date  . Allergy   . Anxiety   . Arthritis    hands  . Atrial fibrillation ([facility]   . Colon cancer ([facility]    s/p partial colectomy 1990, Cscope neg 7-09, next 2014  . DISORDER, MITRAL VALVE    [place]still needs [place] for SBE prophylaxis   . Elevated PSA    prostate nodule, Dr BAlinda Money . GERD (gastroesophageal reflux disease)   . Heart murmur    no problems since surgery  . HYPERGLYCEMIA, BORDERLINE 10/16/2008  . HYPERLIPIDEMIA 06/15/2006   diet controlled  . Hypertension   . HYPERTROPHY PROSTATE W/O UR OBST & OTH LUTS 10/16/2008  . Neuropathy   . Right groin pain    Dr. WRedmond Pulling . SCC (squamous cell carcinoma) 06/2017   s/p Mohs---Dr [place]ilana Na . [place] ([facility]   . Wears partial dentures    lower partial  MEDICATIONS: Current Outpatient Medications on File Prior to Visit  Medication Sig Dispense Refill  . ALPRAZolam (XANAX) 0.25 MG tablet TAKE 1 TABLET 2 TABLET BY MOUTH TWICE DAILY AS NEEDED FOR ANXIETY 40 tablet 1  . amLODipine (NORVASC) 5 MG tablet Take 1 tablet (5 mg total) by mouth daily. 30 tablet 6  . Arginine 500 MG CAPS Take 1,000 mg by mouth daily.     Marland Kitchen ascorbic acid (VITAMIN C) 500 MG/5ML syrup Take 1,000 mg by mouth 2 (two) times daily.    Marland Kitchen ELIQUIS 5 MG TABS tablet TAKE 1 TABLET BY MOUTH TWICE DAILY 60 tablet 4  . fluorometholone (FML) 0.1 % ophthalmic suspension Place 1 drop into the right eye 3 times daily for 7 days.    Marland Kitchen gabapentin (NEURONTIN) 300 MG capsule Take 2 capsules (315m) in the AM and 3 capsules (9051m in the evening  0  . Glucosamine-MSM-Hyaluronic Acd (JOINT HEALTH PO) Take 1 tablet by mouth daily. JARROW JOINT BUILDER    . loratadine (CLARITIN) 10 MG tablet Take 10 mg by mouth at bedtime.     . Marland Kitchenosartan (COZAAR) 100 MG tablet 75 mg a day      . Lycopene 10 MG CAPS Take 1 capsule by mouth 2 (two) times daily. JARROW FORMULA LYCO-SORB PROSTATE SUPPORT     . metoprolol succinate (TOPROL-XL) 25 MG 24 hr tablet Take 3 tablets (75 mg total) by mouth daily. 270 tablet 1  . Multiple Vitamins-Minerals (OCUVITE PRESERVISION PO) Take 1 tablet by mouth 2 (two) times daily.     . Omega-3 Fatty Acids (FISH OIL PO) Take 1,600 mg by mouth 2 (two) times daily.     . Marland Kitchenmeprazole (PRILOSEC) 40 MG capsule Take 1 capsule (40 mg total) by mouth daily. 90 capsule 3  . prednisoLONE acetate (PRED FORTE) 1 % ophthalmic suspension Place 1 drop into the right eye 2 times daily for 7 days.    . rosuvastatin (CRESTOR) 40 MG tablet Take 1 tablet (40 mg total) by mouth daily. 90 tablet 1   No current facility-administered medications on file prior to visit.    ALLERGIES: Allergies  Allergen Reactions  . Statins Other (See Comments)    Myalgias (intolerance)    FAMILY HISTORY: Family History  Problem Relation Age of Onset  . Colon cancer Other        M side (cousins)  . Diabetes Neg Hx   . Stroke Neg Hx   . CAD Neg Hx   . Prostate cancer Neg Hx   . Rectal cancer Neg Hx   . Esophageal cancer Neg Hx     SOCIAL HISTORY: Social History   Socioeconomic History  . Marital status: Widowed    Spouse name: suzanne  . Number of children: 2  . Years of education: college  . Highest education level: Not on file  Occupational History  . Occupation: retired   Tobacco Use  . Smoking status: Former Smoker    Packs/day: 0.50    Years: 20.00    Pack years: 10.00    Types: Cigarettes    Quit date: 02/23/1983    Years since quitting: 36.4  . Smokeless tobacco: Never Used  Substance and Sexual Activity  . Alcohol use: Not Currently    Alcohol/week: 7.0 standard drinks    Types: 7 Glasses of wine per week  . Drug use: No  . Sexual activity: Not Currently  Other Topics Concern  . Not on file  Social History Narrative  From [place], lost wife 08/2017 to  cancer   Daughter in [place], son in [place]   Right handed      Social Determinants of Health   Financial Resource Strain:   . Difficulty of Paying Living Expenses:   Food Insecurity:   . Worried About Running Out of Food in the Last Year:   . Ran Out of Food in the Last Year:   Transportation Needs:   . Lack of Transportation (Medical):   . Lack of Transportation (Non-Medical):   Physical Activity:   . Days of Exercise per Week:   . Minutes of Exercise per Session:   Stress:   . Feeling of Stress :   Social Connections:   . Frequency of Communication with Friends and Family:   . Frequency of Social Gatherings with Friends and Family:   . Attends Religious Services:   . Active Member of Clubs or Organizations:   . Attends Club or Organization Meetings:   . Marital Status:   Intimate Partner Violence:   . Fear of Current or Ex-Partner:   . Emotionally Abused:   . Physically Abused:   . Sexually Abused:     REVIEW OF SYSTEMS: Constitutional: No fevers, chills, or sweats, no generalized fatigue, change in appetite Eyes: No visual changes, double vision, eye pain Ear, nose and throat: No hearing loss, ear pain, nasal congestion, sore throat Cardiovascular: No chest pain, palpitations Respiratory:  No shortness of breath at rest or with exertion, wheezes GastrointestinaI: No nausea, vomiting, diarrhea, abdominal pain, fecal incontinence Genitourinary:  No dysuria, urinary retention or frequency Musculoskeletal:  No neck pain, back pain Integumentary: No rash, pruritus, skin lesions Neurological: as above Psychiatric: No depression, insomnia, anxiety Endocrine: No palpitations, fatigue, diaphoresis, mood swings, change in appetite, change in weight, increased thirst Hematologic/Lymphatic:  No anemia, purpura, petechiae. Allergic/Immunologic: no itchy/runny eyes, nasal congestion, recent allergic reactions, rashes  PHYSICAL EXAM: Vitals:   07/19/19 1139  BP: 114/77  Pulse:  (!) 56  Resp: 20  SpO2: 100%   General: No acute distress Head:  Normocephalic/atraumatic, there is redness and small dry scabs on the right frontotemporal region, non-tender, no vesicles Skin/Extremities: No rash, no edema Neurological Exam: alert and oriented to person, place, and time. No aphasia or dysarthria. Fund of knowledge is appropriate.  Recent and remote memory are intact.  Attention and concentration are normal. Cranial nerves: Pupils equal, round, reactive to light.Extraocular movements intact with no nystagmus. Visual fields full. Facial sensation intact. No facial asymmetry. Motor: moves all extremities symmetrically. Gait narrow-based and steady   IMPRESSION: This is a pleasant 78 yo RH man with a history of colon cancer, hyperlipidemia, previous mitral valve repair with recurrent strokes (x3) secondary to atrial fibrillation, no residual deficits. He presents for an urgent visit due to pain above the right brow and eye likely due to trigeminal neuralgia. We discussed adding on carbamazepine to gabapentin, he has drowsiness on higher doses of gabapentin. Side effects of CBZ discussed, start carbamazepine 200mg 1/2 tab BID x 3 days, then increase to 200mg BID. There is redness over the right forehead, no vesicles, he states redness started after he applied unrecalled cream. Continue to monitor, appearance not classic for shingles. Continue Eliquis and control of vascular risk factors for secondary stroke prevention. He knows to go to the ER for any sudden change in symptoms. Follow-up as scheduled in September, he knows to call for any changes.   Thank you for allowing me to participate in his   care.  Please do not hesitate to call for any questions or concerns.   Ellouise Newer, M.D.   CC: Dr. [name]

## 2019-07-20 ENCOUNTER — Telehealth: Payer: Self-pay | Admitting: Internal Medicine

## 2019-07-20 ENCOUNTER — Encounter: Payer: Self-pay | Admitting: Neurology

## 2019-07-20 ENCOUNTER — Telehealth: Payer: Self-pay | Admitting: Neurology

## 2019-07-20 MED ORDER — BACLOFEN 5 MG PO TABS: ORAL_TABLET | ORAL | 5 refills | Status: DC

## 2019-07-20 NOTE — Telephone Encounter (Signed)
Pt c/o medication issue:  1. Name of Medication:  carbamazepine 200mg  ELIQUIS 5 MG TABS tablet  2. How are you currently taking this medication (dosage and times per day)? Has not started, eliquis 1 tablet twice a day  3. Are you having a reaction (difficulty breathing--STAT)? no  4. What is your medication issue? Patient states he was prescribed the  Carbamazepine by his neurologist Dr. [name] for his neuropathy. He states the pharmacy told him the medication could dilute his eliquis. He would like to know if he can take the new medication.

## 2019-07-20 NOTE — Telephone Encounter (Signed)
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 [name] states his cardio nurse did state that he should be on warfarin instead but does not want to be on warfarin and would like to discuss with the doctor today if there is a new alternative medication that can be prescribed."

## 2019-07-20 NOTE — Telephone Encounter (Signed)
Pls let him know that the other alternatives can all cause drowsiness. If he would like to try, we can do a low dose of Baclofen 5mg  1 tablet every night for 3 days, and if no side effects, he can take it twice a day. Main side effect is drowsiness, dizziness. If he is agreeable,pls send in Rx, thanks

## 2019-07-20 NOTE — Addendum Note (Signed)
Addended by: Cameron Sprang on: 07/20/2019 05:00 PM   Modules accepted: Orders

## 2019-07-20 NOTE — Telephone Encounter (Signed)
Patient should check options with neurologist. If carbamazepine needed, we need to transition eliquis to warfarin.

## 2019-07-20 NOTE — Telephone Encounter (Signed)
Pt updated and voiced he would consult with neurologist.

## 2019-07-24 ENCOUNTER — Telehealth: Payer: Self-pay | Admitting: Neurology

## 2019-07-24 DIAGNOSIS — Z08 Encounter for follow-up examination after completed treatment for malignant neoplasm: Secondary | ICD-10-CM | POA: Diagnosis not present

## 2019-07-24 DIAGNOSIS — Z85828 Personal history of other malignant neoplasm of skin: Secondary | ICD-10-CM | POA: Diagnosis not present

## 2019-07-24 DIAGNOSIS — L570 Actinic keratosis: Secondary | ICD-10-CM | POA: Diagnosis not present

## 2019-07-24 DIAGNOSIS — R208 Other disturbances of skin sensation: Secondary | ICD-10-CM | POA: Diagnosis not present

## 2019-07-24 NOTE — Telephone Encounter (Signed)
Patient is wanting to speak with a nurse about his baclofen 5 MG.

## 2019-07-25 NOTE — Telephone Encounter (Signed)
Pt called stated that his eye was still hurting pt informed that he needed to give his medication time to work yesterday was the 1st day he had started taken it twice a day, pt stated that he would keep [place] posted on how he is doing

## 2019-08-02 ENCOUNTER — Ambulatory Visit (INDEPENDENT_AMBULATORY_CARE_PROVIDER_SITE_OTHER): Payer: PPO | Admitting: *Deleted

## 2019-08-02 DIAGNOSIS — G459 Transient cerebral ischemic attack, unspecified: Secondary | ICD-10-CM

## 2019-08-02 LAB — CUP PACEART REMOTE DEVICE CHECK
Date Time Interrogation Session: 20210609230919
Implantable Pulse Generator Implant Date: 20181010

## 2019-08-03 NOTE — Progress Notes (Signed)
Carelink Summary Report / Loop Recorder 

## 2019-08-30 ENCOUNTER — Telehealth: Payer: Self-pay

## 2019-08-30 ENCOUNTER — Emergency Department (HOSPITAL_COMMUNITY): Payer: PPO

## 2019-08-30 ENCOUNTER — Other Ambulatory Visit: Payer: Self-pay

## 2019-08-30 ENCOUNTER — Encounter (HOSPITAL_COMMUNITY): Payer: Self-pay | Admitting: Emergency Medicine

## 2019-08-30 ENCOUNTER — Emergency Department (HOSPITAL_COMMUNITY)
Admission: EM | Admit: 2019-08-30 | Discharge: 2019-08-30 | Disposition: A | Discharge status: Home / Self Care | Payer: PPO | Attending: Emergency Medicine | Admitting: Emergency Medicine

## 2019-08-30 DIAGNOSIS — I10 Essential (primary) hypertension: Secondary | ICD-10-CM | POA: Insufficient documentation

## 2019-08-30 DIAGNOSIS — Z79899 Other long term (current) drug therapy: Secondary | ICD-10-CM | POA: Diagnosis not present

## 2019-08-30 DIAGNOSIS — R519 Headache, unspecified: Secondary | ICD-10-CM | POA: Diagnosis not present

## 2019-08-30 DIAGNOSIS — Z87891 Personal history of nicotine dependence: Secondary | ICD-10-CM | POA: Insufficient documentation

## 2019-08-30 DIAGNOSIS — R270 Ataxia, unspecified: Secondary | ICD-10-CM | POA: Diagnosis not present

## 2019-08-30 DIAGNOSIS — R001 Bradycardia, unspecified: Secondary | ICD-10-CM | POA: Diagnosis not present

## 2019-08-30 LAB — CBC WITH DIFFERENTIAL/PLATELET
Abs Immature Granulocytes: 0.03 10*3/uL (ref 0.00–0.07)
Basophils Absolute: 0.1 10*3/uL (ref 0.0–0.1)
Basophils Relative: 1 %
Eosinophils Absolute: 0.1 10*3/uL (ref 0.0–0.5)
Eosinophils Relative: 1 %
HCT: 43.6 % (ref 39.0–52.0)
Hemoglobin: 14.1 g/dL (ref 13.0–17.0)
Immature Granulocytes: 0 %
Lymphocytes Relative: 21 %
Lymphs Abs: 1.7 10*3/uL (ref 0.7–4.0)
MCH: 27.6 pg (ref 26.0–34.0)
MCHC: 32.3 g/dL (ref 30.0–36.0)
MCV: 85.5 fL (ref 80.0–100.0)
Monocytes Absolute: 0.4 10*3/uL (ref 0.1–1.0)
Monocytes Relative: 5 %
Neutro Abs: 5.7 10*3/uL (ref 1.7–7.7)
Neutrophils Relative %: 72 %
Platelets: 176 10*3/uL (ref 150–400)
RBC: 5.1 MIL/uL (ref 4.22–5.81)
RDW: 12.8 % (ref 11.5–15.5)
WBC: 7.9 10*3/uL (ref 4.0–10.5)
nRBC: 0 % (ref 0.0–0.2)

## 2019-08-30 LAB — COMPREHENSIVE METABOLIC PANEL
ALT: 17 U/L (ref 0–44)
AST: 21 U/L (ref 15–41)
Albumin: 4.1 g/dL (ref 3.5–5.0)
Alkaline Phosphatase: 47 U/L (ref 38–126)
Anion gap: 10 (ref 5–15)
BUN: 17 mg/dL (ref 8–23)
CO2: 27 mmol/L (ref 22–32)
Calcium: 9.4 mg/dL (ref 8.9–10.3)
Chloride: 103 mmol/L (ref 98–111)
Creatinine, Ser: 1.07 mg/dL (ref 0.61–1.24)
GFR calc Af Amer: >60 mL/min (ref >60)
GFR calc non Af Amer: >60 mL/min (ref >60)
Glucose, Bld: 114 mg/dL — ABNORMAL HIGH (ref 70–99)
Potassium: 4.5 mmol/L (ref 3.5–5.1)
Sodium: 140 mmol/L (ref 135–145)
Total Bilirubin: 1 mg/dL (ref 0.3–1.2)
Total Protein: 7.2 g/dL (ref 6.5–8.1)

## 2019-08-30 LAB — TROPONIN I (HIGH SENSITIVITY)
Troponin I (High Sensitivity): 5 ng/L (ref <18)
Troponin I (High Sensitivity): 5 ng/L (ref <18)

## 2019-08-30 MED ORDER — METOCLOPRAMIDE HCL 5 MG/ML IJ SOLN
10.0000 mg | Freq: Once | INTRAMUSCULAR | Status: DC
  Filled 2019-08-30: qty 2

## 2019-08-30 MED ORDER — DIPHENHYDRAMINE HCL 50 MG/ML IJ SOLN
25.0000 mg | Freq: Once | INTRAMUSCULAR | Status: DC
  Filled 2019-08-30: qty 1

## 2019-08-30 MED ORDER — SODIUM CHLORIDE 0.9 % IV BOLUS
1000.0000 mL | Freq: Once | INTRAVENOUS | Status: AC
  Administered 2019-08-30: 1000 mL via INTRAVENOUS

## 2019-08-30 MED ORDER — SODIUM CHLORIDE 0.9 % IV BOLUS: 1000.0000 mL | Freq: Once | INTRAVENOUS | Status: DC

## 2019-08-30 MED ORDER — LORAZEPAM 2 MG/ML IJ SOLN
1.0000 mg | Freq: Once | INTRAMUSCULAR | Status: AC
  Administered 2019-08-30: 1 mg via INTRAVENOUS
  Filled 2019-08-30: qty 1

## 2019-08-30 NOTE — ED Notes (Signed)
Discharge instructions discussed with pt. Pt to follow up with neurologist. Daughter at bedside. Pt to go home with family member. No other questions at this time.

## 2019-08-30 NOTE — ED Triage Notes (Signed)
Pt c/o HA since 10 am today, pt states he had some hx of stroke and wants to be check. Pt is AO x 4, equal hand grip, no neuro symptoms on assessment.

## 2019-08-30 NOTE — ED Provider Notes (Signed)
[facility] EMERGENCY DEPARTMENT Provider Note   CSN: [id number] Arrival date & time: 08/30/19  1443     History Chief Complaint  Patient presents with  . Headache    Kevin Bass is a 79 y.o. male hx of afib on eliquis, HL, previous strokes, here with headaches, dizziness.  Patient states that around 11 AM today, he felt confused and had some headaches at that time.  Daughter checked in with him around 2 PM and noticed that he was confused as well.  Patient denies any trouble speaking.  Patient states that he has no headaches right now and felt fine.  Patient has a history of multiple strokes in the past and currently on Eliquis.   The history is provided by the patient.       Past Medical History:  Diagnosis Date  . Allergy   . Anxiety   . Arthritis    hands  . Atrial fibrillation ([place])   . Colon cancer [facility])    s/p partial colectomy 1990, Cscope neg 7-09, next 2014  . DISORDER, MITRAL VALVE    MV recontruction at [facility] 2006----still needs ABX for SBE prophylaxis   . Elevated PSA    prostate nodule, [name]  . GERD (gastroesophageal reflux disease)   . Heart murmur    no problems since surgery  . HYPERGLYCEMIA, BORDERLINE 10/16/2008  . HYPERLIPIDEMIA 06/15/2006   diet controlled  . Hypertension   . HYPERTROPHY PROSTATE W/O UR OBST & OTH LUTS 10/16/2008  . Neuropathy   . Right groin pain    Dr. Redmond Pulling  . SCC (squamous cell carcinoma) 06/2017   s/p Mohs---[name]  . Stroke ([place])   . Wears partial dentures    lower partial    Patient Active Problem List   Diagnosis Date Noted  . Chronic rhinitis 03/29/2019  . TIA (transient ischemic attack) 12/03/2017  . Chronic anticoagulation 12/03/2017  . H/O: stroke 12/03/2017  . Paroxysmal atrial fibrillation ([place]) 07/11/2017  . SCC (squamous cell carcinoma) 06/22/2017  . Varicose veins of both lower extremities 06/19/2017  . Bilateral lower extremity edema 06/19/2017  . Trichiasis without  entropion of right lower eyelid 04/14/2017  . Paresthesias 03/03/2017  . Trigeminal neuralgia of right side of face 10/01/2016  . History of mitral valve repair 09/30/2016  . Cerebrovascular accident (CVA) ([place]) 09/12/2016  . GERD (gastroesophageal reflux disease) 09/22/2015  . PCP NOTES >>>>> 11/08/2014  . Chest pain 06/07/2014  . Facial neuropathy 04/02/2013  . Anxiety and depression 02/26/2013  . Annual physical exam 08/30/2012  . HTN (hypertension) 01/22/2011  . BPH (benign prostatic hyperplasia) 10/16/2008  . Hyperglycemia 10/16/2008  . Hyperlipidemia 06/15/2006  . Mitral valve disease 06/15/2006  . Allergic rhinitis 06/15/2006  . COLON CANCER, HX OF 06/15/2006    Past Surgical History:  Procedure Laterality Date  . COLECTOMY  1990  . HERNIA REPAIR Left 2014   [facility] rep w/mesh- March 2014  . LOOP RECORDER INSERTION N/A 12/01/2016   Procedure: LOOP RECORDER INSERTION;  Surgeon: Evans Lance, MD;  Location: [facility] CV LAB;  Service: Cardiovascular;  Laterality: N/A;  . MITRAL VALVE ANNULOPLASTY     [facility] 2006  . TEE WITHOUT CARDIOVERSION N/A 10/05/2016   Procedure: TRANSESOPHAGEAL ECHOCARDIOGRAM (TEE);  Surgeon: [name], MD;  Location: [facility] ENDOSCOPY;  Service: Cardiovascular;  Laterality: N/A;       Family History  Problem Relation Age of Onset  . Colon cancer Other  M side (cousins)  . Diabetes Neg Hx   . Stroke Neg Hx   . CAD Neg Hx   . Prostate cancer Neg Hx   . Rectal cancer Neg Hx   . Esophageal cancer Neg Hx     Social History   Tobacco Use  . Smoking status: Former Smoker    Packs/day: 0.50    Years: 20.00    Pack years: 10.00    Types: Cigarettes    Quit date: 02/23/1983    Years since quitting: 36.5  . Smokeless tobacco: Never Used  Vaping Use  . Vaping Use: Never used  Substance Use Topics  . Alcohol use: Not Currently    Alcohol/week: 7.0 standard drinks    Types: 7 Glasses of wine per week  . Drug use: No    Home  Medications Prior to Admission medications   Medication Sig Start Date End Date Taking? Authorizing Provider  acetaminophen (TYLENOL) 650 MG CR tablet Take 1,300 mg by mouth every 8 (eight) hours as needed for pain.   Yes [provider]  ALPRAZolam (XANAX) 0.25 MG tablet TAKE 1 TABLET 2 TABLET BY MOUTH TWICE DAILY AS NEEDED FOR ANXIETY Patient taking differently: Take 0.25 mg by mouth 2 (two) times daily as needed for anxiety.  02/22/19  Yes [name], Alda Berthold, MD  amLODipine (NORVASC) 5 MG tablet Take 1 tablet (5 mg total) by mouth daily. Patient taking differently: Take 5 mg by mouth every evening.  02/12/19  Yes [name], Alda Berthold, MD  Arginine 500 MG CAPS Take 1,000 mg by mouth daily.    Yes [provider]  Baclofen 5 MG TABS Take 1 tablet every night for 3 days, then increase to 1 tab twice a day Patient taking differently: Take 5 mg by mouth in the morning and at bedtime.  07/20/19  Yes Cameron Sprang, MD  ELIQUIS 5 MG TABS tablet TAKE 1 TABLET BY MOUTH TWICE DAILY Patient taking differently: Take 5 mg by mouth 2 (two) times daily.  05/28/19  Yes Hilty, Nadean Corwin, MD  gabapentin (NEURONTIN) 300 MG capsule Take 300 mg by mouth See admin instructions. Take 300 mg by mouth four times a day- morning, 8:30 PM, 9:30 PM, and 10:00 PM 12/11/18  Yes [name], Alda Berthold, MD  Glucosamine-MSM-Hyaluronic Acd (JOINT HEALTH PO) Take 1 capsule by mouth daily.    Yes [provider]  loratadine (CLARITIN) 10 MG tablet Take 10 mg by mouth at bedtime.    Yes [provider]  losartan (COZAAR) 100 MG tablet Take 75 mg by mouth daily.  06/14/19  Yes [name], Alda Berthold, MD  metoprolol succinate (TOPROL-XL) 25 MG 24 hr tablet Take 3 tablets (75 mg total) by mouth daily. 05/21/19  Yes [name], Alda Berthold, MD  Multiple Vitamins-Minerals (OCUVITE PRESERVISION PO) Take 1 capsule by mouth 2 (two) times daily.    Yes [provider]  NON FORMULARY Take 1-2 packets by mouth See admin instructions. Lypo-Spheric  vitamin C gel packets- Mix 1-2 packets of gel into juice one to two times a day and drink   Yes [provider]  NON FORMULARY Take 10 mg by mouth See admin instructions. Jarrow Formulas Lyco-Sorb (for Prostate & Cardiovascular Health) 10 mg capsules: Take 10 mg by mouth two times a day   Yes [provider]  Omega-3 Fatty Acids (FISH OIL PO) Take 1 capsule by mouth in the morning and at bedtime.   Yes [provider]  omeprazole (  PRILOSEC) 40 MG capsule Take 1 capsule (40 mg total) by mouth daily. Patient taking differently: Take 40 mg by mouth daily before breakfast.  09/01/18  Yes [name], Alda Berthold, MD  prednisoLONE acetate (PRED FORTE) 1 % ophthalmic suspension Place 1 drop into the right eye at bedtime.   Yes [provider]  rosuvastatin (CRESTOR) 40 MG tablet Take 1 tablet (40 mg total) by mouth daily. Patient taking differently: Take 40 mg by mouth every evening.  05/28/19  Yes Colon Branch, MD  carbamazepine (TEGRETOL) 200 MG tablet  07/19/19   [provider]    Allergies    Statins  Review of Systems   Review of Systems  Neurological: Positive for headaches.  Psychiatric/Behavioral: Positive for confusion.  All other systems reviewed and are negative.   Physical Exam Updated Vital Signs BP 102/62   Pulse (!) 55   Temp 98.5 F (36.9 C) (Oral)   Resp 14   Ht 5\' 10"  (1.778 m)   Wt 80.7 kg   SpO2 100%   BMI 25.54 kg/m   Physical Exam Vitals and nursing note reviewed.  HENT:     Head: Normocephalic.  Eyes:     Extraocular Movements: Extraocular movements intact.     Pupils: Pupils are equal, round, and reactive to light.  Cardiovascular:     Rate and Rhythm: Normal rate and regular rhythm.     Heart sounds: Normal heart sounds.  Pulmonary:     Effort: Pulmonary effort is normal.     Breath sounds: Normal breath sounds.  Abdominal:     General: Bowel sounds are normal.     Palpations: Abdomen is soft.  Musculoskeletal:         General: Normal range of motion.     Cervical back: Normal range of motion and neck supple.  Skin:    General: Skin is warm.  Neurological:     Mental Status: He is alert and oriented to person, place, and time.     Cranial Nerves: No cranial nerve deficit or facial asymmetry.     Sensory: No sensory deficit.     Motor: No weakness.  Psychiatric:        Mood and Affect: Mood normal.        Behavior: Behavior normal.     ED Results / Procedures / Treatments   Labs (all labs ordered are listed, but only abnormal results are displayed) Labs Reviewed  COMPREHENSIVE METABOLIC PANEL - Abnormal; Notable for the following components:      Result Value   Glucose, Bld 114 (*)    All other components within normal limits  CBC WITH DIFFERENTIAL/PLATELET  TROPONIN I (HIGH SENSITIVITY)  TROPONIN I (HIGH SENSITIVITY)    EKG EKG Interpretation  Date/Time:  Thursday August 30 2019 15:02:25 EDT Ventricular Rate:  59 PR Interval:  174 QRS Duration: 90 QT Interval:  422 QTC Calculation: 417 R Axis:   -10 Text Interpretation: Sinus bradycardia with sinus arrhythmia Possible Anterior infarct , age undetermined Abnormal ECG No significant change since last tracing Confirmed by Wandra Arthurs [id number]) on 08/30/2019 8:33:40 PM   Radiology MR BRAIN WO CONTRAST  Result Date: 08/30/2019 CLINICAL DATA:  Ataxia EXAM: MRI HEAD WITHOUT CONTRAST TECHNIQUE: Multiplanar, multiecho pulse sequences of the brain and surrounding structures were obtained without intravenous contrast. COMPARISON:  Brain MRI 12/03/2017 FINDINGS: Brain: No acute infarct, acute hemorrhage or extra-axial collection. Normal white matter signal. Normal volume of CSF spaces. Normal midline structures. No  chronic microhemorrhage. There is an old right cerebellar infarct and an old infarct of the left parietal operculum. Vascular: Normal flow voids. Skull and upper cervical spine: Normal marrow signal. Sinuses/Orbits: Negative. Other: None.  IMPRESSION: 1. No acute intracranial abnormality. 2. Old right cerebellar and left parietal infarcts. Electronically Signed   By: Ulyses Jarred M.D.   On: 08/30/2019 21:45    Procedures Procedures (including critical care time)  Medications Ordered in ED Medications  sodium chloride 0.9 % bolus 1,000 mL (1,000 mLs Intravenous New Bag/Given 08/30/19 2142)  LORazepam (ATIVAN) injection 1 mg (1 mg Intravenous Given 08/30/19 2106)    ED Course  I have reviewed the triage vital signs and the nursing notes.  Pertinent labs & imaging results that were available during my care of the patient were reviewed by me and considered in my medical decision making (see chart for details).    MDM Rules/Calculators/A&P                          Kevin Bass is a 79 y.o. male here with confusion and headaches.  Consider complex migraines versus stroke versus TIA.  Patient had a history of stroke in the past.  Patient now has nonfocal neuro exam.  Has normal cranial nerve exam as well as finger-to-nose and strength and sensation.  Given history of stroke, will get MRI brain.  Will get CBC and CMP as well.  10:38 PM Patient's labs and MRI brain are unremarkable.  Stable for discharge.  Recommend follow-up with neurologist.  Final Clinical Impression(s) / ED Diagnoses Final diagnoses:  None    Rx / DC Orders ED Discharge Orders    None       Drenda Freeze, MD 08/30/19 2239

## 2019-08-30 NOTE — Discharge Instructions (Addendum)
Continue your current meds   Follow-up with your neurologist.  Return to ER if you have worse headaches, dizziness, weakness, trouble speaking.

## 2019-08-30 NOTE — Telephone Encounter (Signed)
Patient currently being treated in ER.    [facility] Day - Client TELEPHONE ADVICE RECORD AccessNurse Patient Name: Kevin Bass Gender: Male DOB: 1940-03-25 Age: 79 Y 16 D Return Phone Number: [phone] (Primary), [phone] (Secondary) Address: [name] [address] Client [facility] - Client Client Site [facility] - Day Physician Kathlene November - MD Contact Type Call Who Is Calling Patient / Member / Family / Caregiver Call Type Triage / Clinical Relationship To Patient Self Return Phone Number [phone] (Primary) Chief Complaint CONFUSION - new onset Reason for Call Symptomatic / Request for [place] states that he was very disoriented about an hour ago and now has a headache. [name] said he has a history of 3 strokes and is unsure if he had a mini stroke. [name] wants to know if he should go to the ER for eval. [facility] ED Translation No Nurse Assessment Nurse: Gildardo Pounds, RN, Amy Date/Time Eilene Ghazi Time): 08/30/2019 1:23:54 PM Confirm and document reason for call. If symptomatic, describe symptoms. ---[name] states that he was very disoriented about an hour ago. He did not know where he was. Now he has a headache. [name] said he has a history of 3 strokes and is unsure if he had a mini stroke. [name] wants to know if he should go to the ER for evaluation. Has the patient had close contact with a person known or suspected to have the novel coronavirus illness OR traveled / lives in area with major community spread (including international travel) in the last 14 days from the onset of symptoms? * If Asymptomatic, screen for exposure and travel within the last 14 days. ---No Does the patient have any new or worsening symptoms? ---Yes Will a triage be completed? ---Yes Related visit to physician within the last 2 weeks?  ---No Does the PT have any chronic conditions? (i.e. diabetes, asthma, this includes High risk factors for pregnancy, etc.) ---Yes List chronic conditions. ---CVA, mitral valve regurgitation Is this a behavioral health or substance abuse call? ---No Guidelines Guideline Title Affirmed Question Affirmed Notes Nurse Date/Time (Eastern Time) Confusion - Delirium Headache or vomiting Lovelace, RN, Amy 08/30/2019 1:25:18 PMPLEASE NOTE: All timestamps contained within this report are represented as [place] Standard Time. CONFIDENTIALTY NOTICE: This fax transmission is intended only for the addressee. It contains information that is legally privileged, confidential or otherwise protected from use or disclosure. If you are not the intended recipient, you are strictly prohibited from reviewing, disclosing, copying using or disseminating any of this information or taking any action in reliance on or regarding this information. If you have received this fax in error, please notify us immediately by telephone so that we can arrange for its return to [place]. Phone: [phone], Toll-Free: [phone], Fax: [phone] Page: 2 of 2 Call Id: [id number] [place]. Time Eilene Ghazi Time) Disposition Final User 08/30/2019 1:23:14 PM Send to Urgent Queue Sundra Aland 06/29/175 9:39:03 PM Go to ED Now Yes Lovelace, RN, Amy [name] Disagree/Comply Comply [name] Understands Yes PreDisposition Did not know what to do Care Advice Given Per Guideline GO TO ED NOW: * You need to be seen in the Emergency Department. * Go to the ED at ___________ [facility] now. Drive carefully. CARE ADVICE given per Confusion-Delirium (Adult) guideline. NOTE TO TRIAGER - DRIVING: * Another adult should drive. Referrals GO TO FACILITY OTHER - SPECIF

## 2019-08-30 NOTE — ED Notes (Signed)
Pt transported to MRI 

## 2019-08-30 NOTE — Telephone Encounter (Signed)
Error

## 2019-08-31 ENCOUNTER — Telehealth: Payer: Self-pay

## 2019-08-31 NOTE — Telephone Encounter (Signed)
Appointment schedule with patient

## 2019-08-31 NOTE — Telephone Encounter (Signed)
Needs ED f/u w/ Dr. [name] please.

## 2019-09-03 ENCOUNTER — Ambulatory Visit (INDEPENDENT_AMBULATORY_CARE_PROVIDER_SITE_OTHER): Payer: PPO | Admitting: *Deleted

## 2019-09-03 DIAGNOSIS — I639 Cerebral infarction, unspecified: Secondary | ICD-10-CM

## 2019-09-03 LAB — CUP PACEART REMOTE DEVICE CHECK
Date Time Interrogation Session: 20210711235705
Implantable Pulse Generator Implant Date: 20181010

## 2019-09-04 NOTE — Progress Notes (Signed)
Carelink Summary Report / Loop Recorder 

## 2019-09-07 ENCOUNTER — Encounter: Payer: Self-pay | Admitting: Internal Medicine

## 2019-09-07 ENCOUNTER — Other Ambulatory Visit: Payer: Self-pay

## 2019-09-07 ENCOUNTER — Ambulatory Visit (INDEPENDENT_AMBULATORY_CARE_PROVIDER_SITE_OTHER): Payer: PPO | Admitting: Internal Medicine

## 2019-09-07 VITALS — BP 125/80 | HR 60 | Temp 97.7°F | Resp 18 | Ht 70.0 in | Wt 181.2 lb

## 2019-09-07 DIAGNOSIS — I10 Essential (primary) hypertension: Secondary | ICD-10-CM | POA: Diagnosis not present

## 2019-09-07 DIAGNOSIS — G500 Trigeminal neuralgia: Secondary | ICD-10-CM | POA: Diagnosis not present

## 2019-09-07 DIAGNOSIS — R519 Headache, unspecified: Secondary | ICD-10-CM | POA: Diagnosis not present

## 2019-09-07 NOTE — Progress Notes (Signed)
Subjective:    Patient ID: Kevin Bass, male    DOB: 1941/01/12, 79 y.o.   MRN: [id number]  DOS:  09/07/2019 Type of visit - description: Follow-up  Went to the ER 08/30/2019, chart reviewed: Symptoms were a headache and he felt disoriented, family noted pt to be confused. Upon arrival to the ER BP was 102/62. Work-up included MRI brain without contrast: No acute CMP, CBC, troponins: All okay. EKG: Sinus brady Symptoms resolved within few hours and have not return  Review of Systems  Denies chest pain, difficulty breathing, palpitations No dizziness, diplopia, slurred speech or facial numbness.  Past Medical History:  Diagnosis Date  . Allergy   . Anxiety   . Arthritis    hands  . Atrial fibrillation ([place])   . Colon cancer [facility])    s/p partial colectomy 1990, Cscope neg 7-09, next 2014  . DISORDER, MITRAL VALVE    MV recontruction at [place] 2006----still needs ABX for SBE prophylaxis   . Elevated PSA    prostate nodule, [name]  . GERD (gastroesophageal reflux disease)   . Heart murmur    no problems since surgery  . HYPERGLYCEMIA, BORDERLINE 10/16/2008  . HYPERLIPIDEMIA 06/15/2006   diet controlled  . Hypertension   . HYPERTROPHY PROSTATE W/O UR OBST & OTH LUTS 10/16/2008  . Neuropathy   . Right groin pain    Dr. Redmond Pulling  . SCC (squamous cell carcinoma) 06/2017   s/p Mohs---[name]  . Stroke ([place])   . Wears partial dentures    lower partial    Past Surgical History:  Procedure Laterality Date  . COLECTOMY  1990  . HERNIA REPAIR Left 2014   [facility] rep w/mesh- March 2014  . LOOP RECORDER INSERTION N/A 12/01/2016   Procedure: LOOP RECORDER INSERTION;  Surgeon: Evans Lance, MD;  Location: [facility] CV LAB;  Service: Cardiovascular;  Laterality: N/A;  . MITRAL VALVE ANNULOPLASTY     [facility] 2006  . TEE WITHOUT CARDIOVERSION N/A 10/05/2016   Procedure: TRANSESOPHAGEAL ECHOCARDIOGRAM (TEE);  Surgeon: [name], MD;  Location: [facility] ENDOSCOPY;  Service:  Cardiovascular;  Laterality: N/A;    Allergies as of 09/07/2019      Reactions   Statins Other (See Comments)   Myalgias (intolerance)      Medication List       Accurate as of September 07, 2019 11:59 PM. If you have any questions, ask your nurse or doctor.        acetaminophen 650 MG CR tablet Commonly known as: TYLENOL Take 1,300 mg by mouth every 8 (eight) hours as needed for pain.   ALPRAZolam 0.25 MG tablet Commonly known as: XANAX TAKE 1 TABLET 2 TABLET BY MOUTH TWICE DAILY AS NEEDED FOR ANXIETY What changed:   how much to take  how to take this  when to take this  reasons to take this  additional instructions   amLODipine 5 MG tablet Commonly known as: NORVASC Take 1 tablet (5 mg total) by mouth daily. What changed: when to take this   Arginine 500 MG Caps Take 1,000 mg by mouth daily.   Baclofen 5 MG Tabs Take 1 tablet every night for 3 days, then increase to 1 tab twice a day What changed:   how much to take  how to take this  when to take this  additional instructions   carbamazepine 200 MG tablet Commonly known as: TEGRETOL   Eliquis 5 MG Tabs tablet Generic drug: apixaban TAKE  1 TABLET BY MOUTH TWICE DAILY What changed: how much to take   FISH OIL PO Take 1 capsule by mouth in the morning and at bedtime.   gabapentin 300 MG capsule Commonly known as: NEURONTIN Take 300 mg by mouth See admin instructions. Take 300 mg by mouth four times a day- morning, 8:30 PM, 9:30 PM, and 10:00 PM   JOINT HEALTH PO Take 1 capsule by mouth daily.   loratadine 10 MG tablet Commonly known as: CLARITIN Take 10 mg by mouth at bedtime.   losartan 100 MG tablet Commonly known as: COZAAR Take 75 mg by mouth daily.   metoprolol succinate 25 MG 24 hr tablet Commonly known as: TOPROL-XL Take 3 tablets (75 mg total) by mouth daily.   NON FORMULARY Take 1-2 packets by mouth See admin instructions. Lypo-Spheric vitamin C gel packets- Mix 1-2 packets of  gel into juice one to two times a day and drink   NON FORMULARY Take 10 mg by mouth See admin instructions. [employer] Formulas Lyco-Sorb (for Prostate & Cardiovascular Health) 10 mg capsules: Take 10 mg by mouth two times a day   OCUVITE PRESERVISION PO Take 1 capsule by mouth 2 (two) times daily.   omeprazole 40 MG capsule Commonly known as: PRILOSEC Take 1 capsule (40 mg total) by mouth daily. What changed: when to take this   prednisoLONE acetate 1 % ophthalmic suspension Commonly known as: PRED FORTE Place 1 drop into the right eye at bedtime.   rosuvastatin 40 MG tablet Commonly known as: CRESTOR Take 1 tablet (40 mg total) by mouth daily. What changed: when to take this          Objective:   Physical Exam BP 125/80 (BP Location: Right Arm, Patient Position: Sitting, Cuff Size: Small)   Pulse 60   Temp 97.7 F (36.5 C) (Oral)   Resp 18   Ht 5\' 10"  (1.778 m)   Wt 181 lb 4 oz (82.2 kg)   SpO2 98%   BMI 26.01 kg/m  General:   Well developed, NAD, BMI noted. HEENT:  Normocephalic . Face symmetric, atraumatic Neck: Normal carotid pulses. Lungs:  CTA B Normal respiratory effort, no intercostal retractions, no accessory muscle use. Heart: RRR,  no murmur.  Lower extremities: no pretibial edema bilaterally  Skin: Not pale. Not jaundice Neurologic:  alert & oriented X3.  Speech normal, gait appropriate for age and unassisted.  EOMI, motor symmetric. Psych--  Cognition and judgment appear intact.  Cooperative with normal attention span and concentration.  Behavior appropriate. No anxious or depressed appearing.      Assessment     Assessment Prediabetes HTN Hyperlipidemia, statin intolerant (simva-pravachol: shoulder pain) Anxiety- rarely takes xanax Neuro  --Facial neuropathy dx 2015, no w/u, sx resurface x1 after temporary d/c of meds  --saw neuro again 02/2017, labs (-), had a NCS, Rx gaba -- strokes CV: --Mitral valve annuloplasty 2006-- needs SBE  prophylaxis -- SOB 04-2016, seen at [facility], had a ECHO Nl fx MV, cath: no CAD --Cryptogenic stroke 09/12/2016, had slurred speech. 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 [name] Colon cancer partial colectomy 1990, colonoscopy 08-2007 negative, Cscope again 09-2012 normal, 5 years  SCC . MOHS 06/2017  PLAN: ER follow-up. Headache: Was seen in the ER with a  headache associated with disorientation, resolved within hours, MRI of the brain with no acute findings.  EKG with sinus bradycardia.  I did notice that the BP at the ER was low.  Wonder if there is a relationship. His last carotid ultrasound in 2019 was normal, carotid exam today normal. Plan: Observation w/ low threshold to call or return to ER if he has more neurological symptoms. HTN: On amlodipine , losartan 75 mg and metoprolol 75 mg, ambulatory BPs very good. Trigeminal neuralgia: Recently neurology added carbamazepine to gabapentin.  Apparently has some difficulty with carbamazepine and is taking only 1 tablet daily. RTC 10-21 CPX  4== HTN Saw cardiology 05/29/2019, at the time BP was acceptable, impression was labile HTN, meds were not changed.  The patient adjust his losartan dose according to the blood pressure of the day, at some point BP was 90/50 and he consistently decreased losartan dose to 75 mg daily. Sometimes skip amlodipine. Good compliance with metoprolol Plan: Continue medications but avoid frequent changes in doses, check a BMP, call if not at goal.  See AVS. High cholesterol: Saw cardiology recently, recommended to [facility] because he is tolerated statins. They plan to recheck a FLP in few months.  BPH: Last visit with urology 12-2017, then recommend 1 additional visit and then follow-up  prn; he elected to see them one more time, he will set his own appointment. Trigeminal neuralgia: Last visit with neurology 05/28/2019, symptoms currently controlled with gabapentin Preventive care: Had Covid shots RTC 4 months   This visit occurred during the SARS-CoV-2 public health emergency.  Safety protocols were in place, including screening questions prior to the visit, additional usage of staff PPE, and extensive cleaning of exam room while observing appropriate contact time as indicated for disinfecting solutions.

## 2019-09-07 NOTE — Patient Instructions (Addendum)
Please schedule Medicare Wellness with Kevin Bass.  Check the  blood pressure regularly  BP GOAL is between 110/65 and  135/85. If it is consistently higher or lower, let me know    [facility], PLEASE SCHEDULE YOUR APPOINTMENTS Come back for a physical exam October 2021

## 2019-09-07 NOTE — Progress Notes (Signed)
Pre visit review using our clinic review tool, if applicable. No additional management support is needed unless otherwise documented below in the visit note. 

## 2019-09-08 NOTE — Assessment & Plan Note (Signed)
ER follow-up. Headache: Was seen in the ER with a headache associated with disorientation, resolved within hours, MRI of the brain with no acute findings.  EKG with sinus bradycardia.  I did notice that the BP at the ER was low.  Wonder if there is a relationship. His last carotid ultrasound in 2019 was normal, carotid exam today normal. Plan: Observation w/ low threshold to call or return to ER if he has more neurological symptoms. HTN: On amlodipine , losartan 75 mg and metoprolol 75 mg, ambulatory BPs very good. Trigeminal neuralgia: Recently neurology added carbamazepine to gabapentin.  Apparently has some difficulty with carbamazepine and is taking only 1 tablet daily. RTC 10-21 CPX

## 2019-10-04 ENCOUNTER — Ambulatory Visit (INDEPENDENT_AMBULATORY_CARE_PROVIDER_SITE_OTHER): Payer: PPO | Admitting: *Deleted

## 2019-10-04 DIAGNOSIS — I639 Cerebral infarction, unspecified: Secondary | ICD-10-CM

## 2019-10-04 LAB — CUP PACEART REMOTE DEVICE CHECK
Date Time Interrogation Session: 20210811235945
Implantable Pulse Generator Implant Date: 20181010

## 2019-10-08 NOTE — Progress Notes (Signed)
Carelink Summary Report / Loop Recorder 

## 2019-10-16 ENCOUNTER — Ambulatory Visit: Payer: PPO | Admitting: Internal Medicine

## 2019-10-17 ENCOUNTER — Encounter: Payer: Self-pay | Admitting: Internal Medicine

## 2019-10-26 ENCOUNTER — Other Ambulatory Visit: Payer: Self-pay | Admitting: Internal Medicine

## 2019-10-28 ENCOUNTER — Other Ambulatory Visit: Payer: Self-pay | Admitting: Internal Medicine

## 2019-10-30 ENCOUNTER — Telehealth: Payer: PPO

## 2019-10-30 ENCOUNTER — Other Ambulatory Visit: Payer: Self-pay | Admitting: Internal Medicine

## 2019-10-31 ENCOUNTER — Other Ambulatory Visit: Payer: Self-pay | Admitting: Internal Medicine

## 2019-10-31 ENCOUNTER — Other Ambulatory Visit: Payer: Self-pay

## 2019-10-31 ENCOUNTER — Ambulatory Visit: Payer: PPO | Admitting: Neurology

## 2019-10-31 ENCOUNTER — Encounter: Payer: Self-pay | Admitting: Neurology

## 2019-10-31 VITALS — BP 102/67 | HR 68 | Resp 18 | Ht 70.0 in | Wt 182.0 lb

## 2019-10-31 DIAGNOSIS — G500 Trigeminal neuralgia: Secondary | ICD-10-CM

## 2019-10-31 DIAGNOSIS — G629 Polyneuropathy, unspecified: Secondary | ICD-10-CM

## 2019-10-31 DIAGNOSIS — I639 Cerebral infarction, unspecified: Secondary | ICD-10-CM

## 2019-10-31 MED ORDER — BACLOFEN 5 MG PO TABS: ORAL_TABLET | ORAL | 11 refills | Status: DC

## 2019-10-31 MED ORDER — GABAPENTIN 300 MG PO CAPS: 300.0000 mg | ORAL_CAPSULE | ORAL | 3 refills | Status: DC

## 2019-10-31 NOTE — Progress Notes (Signed)
NEUROLOGY FOLLOW UP OFFICE NOTE  Kevin Bass [id number] [birth year]  HISTORY OF PRESENT ILLNESS: I had the pleasure of seeing Kevin Bass in follow-up in the neurology clinic on 10/31/2019.  The patient was last seen 4 months ago. He has a history of recurrent strokes and trigeminal neuralgia. On last visit,he continued to report trigeminal neuralgia pain on gabapentin 38m in AM, 3057mat noon, 90032mhs. Carbamazepine contraindicated due to DOAC use. He was started on Baclofen and reported improvement last July, as well as drowsiness, so he was instructed to wean off and monitor for recurrence of pain. He was in the ER on 08/30/19 for headaches and dizziness. He felt confused, family noticed this as well. He had an MRI brain without contrast which I personally reviewed, no acute changes. There was an old right cerebellar infarct and old left parietal operculum infarct. He does not recall much of his symptoms, but feels better overall. He reports his short term memory is worse, forgetting names/words. He lives alone and denies missing medications, bill payments, or getting lost driving. The trigeminal neuralgia has had good response to Baclofen, when he tried to reduce it, he had more pain, he takes 1 in the early afternoon, and 1 at midnight. He denies any more pain in his teeth or lips. He continues to have neuropathic pain, worse when he is resting in the evening, with more pain in his right arm, right leg feels cold. Symptoms not as bad when he is active.   History on Initial Assessment 10/01/2016: This is a pleasant 79 48 RH man with a history of colon cancer, hyperlipidemia, previous mitral valve repair in his usual state of health until 09/11/16 after taking a walk, he saw his brother-in-law and when he started noticing word-finding difficulties. His wife reported that his speech was not necessarily slurred but he was having trouble finding his words. When symptoms were unchanged the next  day, he decided to go to the ER. He was still noted to have mild word-finding difficulties. There was no focal numbness/tingling/weakness. He denied any headaches, dizziness, diplopia, dysarthria/dysphagia. I personally reviewed MRI brain without contrast which showed an acute infarct in the left posterior insula and parietal operculum. MRA head and carotid dopplers did not show any significant stenosis. Echocardiogram showed EF 60-65%, mild LVH, prior MV repair without significant stenosis or regurgitation, normal LA size. He was switched from aspirin to Plavix. LDL was 124, HbA1c was 5.6. He was unable to get a TEE in the hospital, and has seen Dr. HilDebara Pickettd agreed to having the procedure done on 8/14 followed by loop recorder. He denies any palpitations, chest pain, shortness of breath. No neck/back pain, bowel/bladder dysfunction. No falls. He denies any side effect on Plavix. He was started on Crestor yesterday. He feels he is 95% or more back to baseline, he denies any confusion or focal symptoms. He reports a history of facial pain on the right upper lip radiating up his cheek with good response to gabapentin. He takes 300m30ms with no side effects.  Update 12/07/16: He had no residual deficits from prior stroke, then on 11/29/16 he had sudden onset vertigo that made him fall backward. This lasted for 15 seconds and was followed by a mild headache. He called triage and was instructed to go seek medical attention the next day. He went to the ER and was found to have a right PICA territory stroke. He was evaluated by Neurology with no focal deficits seen. He  reports he was taking aspirin and Plavix when the stroke occurred. He had an extensive workup for the prior stroke, he had a TEE in August 2018 which showed normal left atrium, no thrombus seen. He had a 48-hour monitor which was unremarkable. In the hospital, he had a CTA of the head and neck which did not show any occlusion or stenosis. Repeat TTE  showed EF of 55-60%, left atrium mildly dilated. He had a CT chest/abdomen/pelvis which did not show any evidence of malignancy. On hospital discharge, he was instructed to continue aspirin and Plavix and stop his antihypertensives. He had a loop recorder placed.  Update 12/06/2017: He underwent right median nerve and ulnar nerve decompression on 11/28/17. He had stopped the Eliquis for 2 days for the procedure, and restarted Eliquis on 11/29/17. On 12/02/17, he took a nap and woke up with right arm weakness and numbness. He could not lift his right arm up. After 20 minutes, he started getting more feeling in his arm. Per records, he stood up and noticed his gait was off. The weakness improved after several minutes, but he continued to feel off balance. He went to the ER where SBP was elevated at 170. NIH 0 in the hospital. Records were reviewed, he had an MRI brain without contrast showing an acute infarct in the left hippocampus, occlusion of the left PCA which could be acute. There were chronic infarcts in the right cerebellum and left parietal operculum. He had an echo which showed an EF of 55-60%, left atrium normal, right atrium moderately dilated. No significant stenosis on carotid dopplers. LDL in August 2019 was 120, he was advised to increase Crestor to 50m daily. He was having muscle pains and stopped statin for 5 months, he was restarted Crestor with Zetia last August.  He was discharged home 2 days ago and reports that this is the worst of his strokes. It has affected his memory more than anything. He has also noticed vision changes on the right visual field, he describes blurred/double vision reading on his right side. He feels his strength is back but his balance is still off. He also has left foot pain. He has been dealing with his wife and sister's recent deaths.   Diagnostic Data: Neuropathy labs were normal (TSH, B12, ESR, folate, SPEP/IFE). He had an EMG/NCV of the right UE and LE which  showed right ulnar neuropathy with slowing across the elbow, axon loss and demyelinating in type, mild to moderate in degree; right median neuropathy at or distal to the wrist, consistent with carpal tunnel, mild in degree, and probable L5 radiculopathy, mild. No evidence of a large fiber sensorimotor polyneuropathy.    PAST MEDICAL HISTORY: Past Medical History:  Diagnosis Date  . Allergy   . Anxiety   . Arthritis    hands  . Atrial fibrillation ([place]   . [facility] cancer ([facility]    s/p partial colectomy 1990, Cscope neg 7-09, next 2014  . DISORDER, MITRAL VALVE    [email or web address]still needs ABX for SBE prophylaxis   . Elevated PSA    prostate nodule, Dr BAlinda Money . GERD (gastroesophageal reflux disease)   . Heart murmur    no problems since surgery  . HYPERGLYCEMIA, BORDERLINE 10/16/2008  . HYPERLIPIDEMIA 06/15/2006   diet controlled  . Hypertension   . HYPERTROPHY PROSTATE W/O UR OBST & OTH LUTS 10/16/2008  . Neuropathy   . Right groin pain    Dr. WRedmond Pulling . SCC (  squamous cell carcinoma) 06/2017   s/p Mohs---[name]  . Stroke ([place])   . Wears partial dentures    lower partial    MEDICATIONS: Current Outpatient Medications on File Prior to Visit  Medication Sig Dispense Refill  . acetaminophen (TYLENOL) 650 MG CR tablet Take 1,300 mg by mouth every 8 (eight) hours as needed for pain.    Marland Kitchen ALPRAZolam (XANAX) 0.25 MG tablet TAKE 1 TABLET 2 TABLET BY MOUTH TWICE DAILY AS NEEDED FOR ANXIETY (Patient taking differently: Take 0.25 mg by mouth 2 (two) times daily as needed for anxiety. ) 40 tablet 1  . amLODipine (NORVASC) 5 MG tablet Take 1 tablet (5 mg total) by mouth daily. 90 tablet 0  . Arginine 500 MG CAPS Take 1,000 mg by mouth daily.     . Baclofen 5 MG TABS Take 1 tablet every night for 3 days, then increase to 1 tab twice a day (Patient taking differently: Take 5 mg by mouth in the morning and at bedtime. ) 60 tablet 5  . ELIQUIS 5 MG TABS tablet TAKE 1 TABLET  BY MOUTH TWICE DAILY 180 tablet 1  . gabapentin (NEURONTIN) 300 MG capsule Take 300 mg by mouth See admin instructions. Take 300 mg by mouth four times a day- morning, 8:30 PM, 9:30 PM, and 10:00 PM  0  . Glucosamine-MSM-Hyaluronic Acd (JOINT HEALTH PO) Take 1 capsule by mouth daily.     Marland Kitchen loratadine (CLARITIN) 10 MG tablet Take 10 mg by mouth at bedtime.     Marland Kitchen losartan (COZAAR) 100 MG tablet Take 1 tablet (100 mg total) by mouth daily. 90 tablet 0  . metoprolol succinate (TOPROL-XL) 25 MG 24 hr tablet Take 3 tablets (75 mg total) by mouth daily. 270 tablet 1  . Multiple Vitamins-Minerals (OCUVITE PRESERVISION PO) Take 1 capsule by mouth 2 (two) times daily.     . NON FORMULARY Take 1-2 packets by mouth See admin instructions. Lypo-Spheric vitamin C gel packets- Mix 1-2 packets of gel into juice one to two times a day and drink    . NON FORMULARY Take 10 mg by mouth See admin instructions. [employer] Lyco-Sorb (for Prostate & Cardiovascular Health) 10 mg capsules: Take 10 mg by mouth two times a day    . Omega-3 Fatty Acids (FISH OIL PO) Take 1 capsule by mouth in the morning and at bedtime.    Marland Kitchen omeprazole (PRILOSEC) 40 MG capsule Take 1 capsule (40 mg total) by mouth daily. (Patient taking differently: Take 40 mg by mouth daily before breakfast. ) 90 capsule 3  . prednisoLONE acetate (PRED FORTE) 1 % ophthalmic suspension Place 1 drop into the right eye at bedtime.    . rosuvastatin (CRESTOR) 40 MG tablet Take 1 tablet (40 mg total) by mouth daily. (Patient taking differently: Take 40 mg by mouth every evening. ) 90 tablet 1   No current facility-administered medications on file prior to visit.    ALLERGIES: Allergies  Allergen Reactions  . Statins Other (See Comments)    Myalgias (intolerance)    FAMILY HISTORY: Family History  Problem Relation Age of Onset  . Colon cancer Other        M side (cousins)  . Diabetes Neg Hx   . Stroke Neg Hx   . CAD Neg Hx   . Prostate cancer Neg  Hx   . Rectal cancer Neg Hx   . Esophageal cancer Neg Hx     SOCIAL HISTORY: Social History  Socioeconomic History  . Marital status: Widowed    Spouse name: suzanne  . Number of children: 2  . Years of education: college  . Highest education level: Not on file  Occupational History  . Occupation: retired   Tobacco Use  . Smoking status: Former Smoker    Packs/day: 0.50    Years: 20.00    Pack years: 10.00    Types: Cigarettes    Quit date: 02/23/1983    Years since quitting: 36.7  . Smokeless tobacco: Never Used  Vaping Use  . Vaping Use: Never used  Substance and Sexual Activity  . Alcohol use: Not Currently    Alcohol/week: 7.0 standard drinks    Types: 7 Glasses of wine per week  . Drug use: No  . Sexual activity: Not Currently  Other Topics Concern  . Not on file  Social History Narrative   From [place], lost wife 08/2017 to cancer   Daughter in [place], son in [place]   Right handed      Social Determinants of Health   Financial Resource Strain:   . Difficulty of Paying Living Expenses: Not on file  Food Insecurity:   . Worried About Charity fundraiser in the Last Year: Not on file  . Ran Out of Food in the Last Year: Not on file  Transportation Needs:   . Lack of Transportation (Medical): Not on file  . Lack of Transportation (Non-Medical): Not on file  Physical Activity:   . Days of Exercise per Week: Not on file  . Minutes of Exercise per Session: Not on file  Stress:   . Feeling of Stress : Not on file  Social Connections:   . Frequency of Communication with Friends and Family: Not on file  . Frequency of Social Gatherings with Friends and Family: Not on file  . Attends Religious Services: Not on file  . Active Member of Clubs or Organizations: Not on file  . Attends Archivist Meetings: Not on file  . Marital Status: Not on file  Intimate Partner Violence:   . Fear of Current or Ex-Partner: Not on file  . Emotionally Abused: Not on file  .  Physically Abused: Not on file  . Sexually Abused: Not on file    PHYSICAL EXAM: Vitals:   10/31/19 1525  BP: 102/67  Pulse: 68  Resp: 18  SpO2: 96%   General: No acute distress Head:  Normocephalic/atraumatic Skin/Extremities: No rash, no edema Neurological Exam: alert and oriented to person, place, and time. No aphasia or dysarthria. Fund of knowledge is appropriate.  Recent and remote memory are intact.  Attention and concentration are normal.  Cranial nerves: Pupils equal, round. Extraocular movements intact with no nystagmus. Visual fields full. Facial sensation intact. No facial asymmetry. Motor: Bulk and tone normal, muscle strength 5/5 throughout with no pronator drift. Finger to nose testing intact.  Gait narrow-based and steady, able to tandem walk adequately.  Romberg negative.   IMPRESSION: This is a pleasant 79 yo RH man with a history of colon cancer, hyperlipidemia, previous mitral valve repair with recurrent strokes (x3) secondary to atrial fibrillation, trigeminal neuralgia. He has had no residual deficits from prior stroke, possibly mild cognitive changes. Most recent MRI brain done 08/2019 for headache/confusion showed no acute changes. Trigeminal neuralgia overall improved with Baclofen 80m BID, refills sent. He is also on gabapentin 3067mQID for neuropathy and trigeminal neuralgia. Continue Eliquis and control of vascular risk factors for secondary stroke prevention.  Continue to monitor memory. Follow-up in 6 months, he knows to call for any changes.   Thank you for allowing me to participate in his care.  Please do not hesitate to call for any questions or concerns.   Ellouise Newer, M.D.   CC: Dr. [name]

## 2019-10-31 NOTE — Telephone Encounter (Signed)
6m 82.2kg Scr 1.07 08/30/19 Lovw/hilty 05/29/19 Drug interaction w/carbamazepine will route to pharmd pool for further review

## 2019-10-31 NOTE — Patient Instructions (Signed)
Great to see you! Continue all your medications. Follow-up in 6 months, call for any changes.   RECOMMENDATIONS FOR ALL PATIENTS WITH MEMORY PROBLEMS: 1. Continue to exercise (Recommend 30 minutes of walking everyday, or 3 hours every week) 2. Increase social interactions - continue going to [place] and enjoy social gatherings with friends and family 3. Eat healthy, avoid fried foods and eat more fruits and vegetables 4. Maintain adequate blood pressure, blood sugar, and blood cholesterol level. Reducing the risk of stroke and cardiovascular disease also helps promoting better memory. 5. Avoid stressful situations. Live a simple life and avoid aggravations. Organize your time and prepare for the next day in anticipation. 6. Sleep well, avoid any interruptions of sleep and avoid any distractions in the bedroom that may interfere with adequate sleep quality 7. Avoid sugar, avoid sweets as there is a strong link between excessive sugar intake, diabetes, and cognitive impairment We discussed the Mediterranean diet, which has been shown to help patients reduce the risk of progressive memory disorders and reduces cardiovascular risk. This includes eating fish, eat fruits and green leafy vegetables, nuts like almonds and hazelnuts, walnuts, and also use olive oil. Avoid fast foods and fried foods as much as possible. Avoid sweets and sugar as sugar use has been linked to worsening of memory function.

## 2019-10-31 NOTE — Telephone Encounter (Signed)
Please see telephone note from 07/20/2019.  Patient needs transition to warfarin if unable to stop carbamazepine.

## 2019-11-02 ENCOUNTER — Telehealth: Payer: Self-pay | Admitting: Emergency Medicine

## 2019-11-02 NOTE — Progress Notes (Addendum)
Chronic Care Management Pharmacy Assistant   Name: Kevin Bass  MRN: [id number] DOB: 1940-12-06  Reason for Encounter: Disease State  Patient Questions:  1.  Have you seen any other providers since your last visit? yes  2.  Any changes in your medicines or health? Yes, See below   PCP : Colon Branch, MD  Their chronic conditions include: AFib, Pre-DM, HTN, HLD, Hx of Stroke, Anxiety/Depression, GERD, Allergic Rhinitis, Paresthesia  Office Visits: 09-07-2019 (PCP) Patient presented in the office with [facility]. No medication changes. 06-14-2019 (PCP) Patient presented in the office with [facility]. Physician discontinued Losartan 100 mg tabs daily to Losartan 75 mg tabs daily.  Consults: 09-08--2021  (Neurology) Patient presented in the office with Ellouise Newer for a follow up visit. Physician ordered Baclofen 5 mg tabs twice daily. 08-02-2019 (Cardiologist)  Patient presented in the office with Cristopher Peru.  No medication changes. 07-24-2019  (Dermatology) Patient presented in the office with Janann August.  No medication changes. 07-19-2019 (Neurology) Patient presented in the office with Ellouise Newer for a follow up visit. Physician ordered new medication for patient to start carbamazepine 200mg  1/2 tab BID x 3 days, then increase to 200mg  BID. 07-16-2019 (Ophthalmology) Patient presented in the office with Bettina Gavia for eye pain. Physician ordered new medication fluorometholone 0.1 % ophthalmic suspension place 1 drop into the right eye 3 times daily for 7 days and prednisoLONE acetate 1 % ophthalmic suspension place 1 drop into the right eye 2 times daily for 7 days.  06-06-2019 (Dermatology) Patient presented in the office with Janann August.  No medication changes. 05-29-2019 (Cardiology) Patient presented in the office with [facility] for a follow up visit.  Physician recommended stopping Zetia.  Physician discontinued Losartan 100 mg tabs daily to Losartan 75 mg  tabs daily.  Metoprolol was decreased to 75 mg. CPP please confirm 05-28-2019 (Neurology) Patient presented in the office with Ellouise Newer for a follow up visit. No medication changes 05-02-2019 (Ophthalmology) Patient presented in the office with [facility] Radionchenko for eye pain.  No medication changes. 04-30-2019 (Cardiologist)  Patient presented in the office with Cristopher Peru.  No medication changes.  Hospitalizations: 08-30-2019 [facility] Emergency Department)  Patient presented in the hospital for confusion and headache.  Allergies:   Allergies  Allergen Reactions  . Statins Other (See Comments)    Myalgias (intolerance)    Medications: Outpatient Encounter Medications as of 11/02/2019  Medication Sig  . acetaminophen (TYLENOL) 650 MG CR tablet Take 1,300 mg by mouth every 8 (eight) hours as needed for pain.  Marland Kitchen ALPRAZolam (XANAX) 0.25 MG tablet TAKE 1 TABLET 2 TABLET BY MOUTH TWICE DAILY AS NEEDED FOR ANXIETY (Patient taking differently: Take 0.25 mg by mouth 2 (two) times daily as needed for anxiety. )  . amLODipine (NORVASC) 5 MG tablet Take 1 tablet (5 mg total) by mouth daily.  . Arginine 500 MG CAPS Take 1,000 mg by mouth daily.   . Baclofen 5 MG TABS Take 1 tablet twice a day  . ELIQUIS 5 MG TABS tablet TAKE 1 TABLET BY MOUTH TWICE DAILY  . gabapentin (NEURONTIN) 300 MG capsule Take 1 capsule (300 mg total) by mouth See admin instructions. Take 300 mg by mouth four times a day- morning, 8:30 PM, 9:30 PM, and 10:00 PM  . Glucosamine-MSM-Hyaluronic Acd (JOINT HEALTH PO) Take 1 capsule by mouth daily.   Marland Kitchen loratadine (CLARITIN) 10 MG tablet Take 10 mg by mouth at bedtime.   Marland Kitchen  losartan (COZAAR) 100 MG tablet Take 1 tablet (100 mg total) by mouth daily.  . metoprolol succinate (TOPROL-XL) 25 MG 24 hr tablet Take 3 tablets (75 mg total) by mouth daily.  . Multiple Vitamins-Minerals (OCUVITE PRESERVISION PO) Take 1 capsule by mouth 2 (two) times daily.   . NON FORMULARY Take 1-2  packets by mouth See admin instructions. Lypo-Spheric vitamin C gel packets- Mix 1-2 packets of gel into juice one to two times a day and drink  . NON FORMULARY Take 10 mg by mouth See admin instructions. Jarrow Formulas Lyco-Sorb (for Prostate & Cardiovascular Health) 10 mg capsules: Take 10 mg by mouth two times a day  . Omega-3 Fatty Acids (FISH OIL PO) Take 1 capsule by mouth in the morning and at bedtime.  Marland Kitchen omeprazole (PRILOSEC) 40 MG capsule Take 1 capsule (40 mg total) by mouth daily. (Patient taking differently: Take 40 mg by mouth daily before breakfast. )  . prednisoLONE acetate (PRED FORTE) 1 % ophthalmic suspension Place 1 drop into the right eye at bedtime.  . rosuvastatin (CRESTOR) 40 MG tablet Take 1 tablet (40 mg total) by mouth daily. (Patient taking differently: Take 40 mg by mouth every evening. )   No facility-administered encounter medications on file as of 11/02/2019.    Current Diagnosis: Patient Active Problem List   Diagnosis Date Noted  . Chronic rhinitis 03/29/2019  . TIA (transient ischemic attack) 12/03/2017  . Chronic anticoagulation 12/03/2017  . H/O: stroke 12/03/2017  . Paroxysmal atrial fibrillation ([place]) 07/11/2017  . SCC (squamous cell carcinoma) 06/22/2017  . Varicose veins of both lower extremities 06/19/2017  . Bilateral lower extremity edema 06/19/2017  . Trichiasis without entropion of right lower eyelid 04/14/2017  . Paresthesias 03/03/2017  . Trigeminal neuralgia of right side of face 10/01/2016  . History of mitral valve repair 09/30/2016  . Cerebrovascular accident (CVA) ([place]) 09/12/2016  . GERD (gastroesophageal reflux disease) 09/22/2015  . PCP NOTES >>>>> 11/08/2014  . Chest pain 06/07/2014  . Facial neuropathy 04/02/2013  . Anxiety and depression 02/26/2013  . Annual physical exam 08/30/2012  . HTN (hypertension) 01/22/2011  . BPH (benign prostatic hyperplasia) 10/16/2008  . Hyperglycemia 10/16/2008  . Hyperlipidemia 06/15/2006  .  Mitral valve disease 06/15/2006  . Allergic rhinitis 06/15/2006  . COLON CANCER, HX OF 06/15/2006    Goals Addressed   None    Reviewed chart prior to disease state call. Spoke with patient regarding BP  Recent Office Vitals: BP Readings from Last 3 Encounters:  10/31/19 102/67  09/07/19 125/80  08/30/19 (!) 101/58   Pulse Readings from Last 3 Encounters:  10/31/19 68  09/07/19 60  08/30/19 (!) 52    Wt Readings from Last 3 Encounters:  10/31/19 182 lb (82.6 kg)  09/07/19 181 lb 4 oz (82.2 kg)  08/30/19 178 lb (80.7 kg)     Kidney Function Lab Results  Component Value Date/Time   CREATININE 1.07 08/30/2019 03:08 PM   CREATININE 1.08 06/14/2019 01:58 PM   CREATININE 1.02 09/27/2016 12:30 PM   GFR 65.65 06/14/2019 01:58 PM   GFRNONAA >60 08/30/2019 03:08 PM   GFRAA >60 08/30/2019 03:08 PM    BMP Latest Ref Rng & Units 08/30/2019 06/14/2019 03/12/2019  Glucose 70 - 99 mg/dL 114(H) 82 108(H)  BUN 8 - 23 mg/dL 17 22 22   Creatinine 0.61 - 1.24 mg/dL 1.07 1.08 0.98  Sodium 135 - 145 mmol/L 140 140 140  Potassium 3.5 - 5.1 mmol/L 4.5 4.6 4.2  Chloride 98 - 111 mmol/L 103 103 104  CO2 22 - 32 mmol/L 27 34(H) 31  Calcium 8.9 - 10.3 mg/dL 9.4 9.1 9.3    . Current antihypertensive regimen:   amlodipine 5mg  daily, losartan 75mg  daily, metoprolol succinate 75mg  daily . How often are you checking your Blood Pressure? 1-2x per week . Current home BP readings: Do not keep record but stated readings are around 110/65 . What recent interventions/DTPs have been made by any provider to improve Blood Pressure control since last CPP Visit: None . Any recent hospitalizations or ED visits since last visit with CPP? Yes . What diet changes have been made to improve Blood Pressure Control?  o None . What exercise is being done to improve your Blood Pressure Control?  o None  Adherence Review: Is the patient currently on ACE/ARB medication? Yes Losartan 100 mg  Does the patient have >5  day gap between last estimated fill dates? No      Follow-Up:  Pharmacist Review and Scheduled Follow-Up With Clinical Pharmacist  Patient would like to see Melvenia Beam Day on October 20 after Dr. [name] appointment  [place] Isidor Bromell, [facility] [place] Pharmacist Assistant [phone]  Reviewed by: De Blanch, PharmD Clinical Pharmacist [facility] [phone]

## 2019-11-04 ENCOUNTER — Other Ambulatory Visit: Payer: Self-pay | Admitting: Internal Medicine

## 2019-11-05 ENCOUNTER — Ambulatory Visit (INDEPENDENT_AMBULATORY_CARE_PROVIDER_SITE_OTHER): Payer: PPO | Admitting: *Deleted

## 2019-11-05 DIAGNOSIS — I639 Cerebral infarction, unspecified: Secondary | ICD-10-CM | POA: Diagnosis not present

## 2019-11-05 LAB — CUP PACEART REMOTE DEVICE CHECK
Date Time Interrogation Session: 20210912000001
Implantable Pulse Generator Implant Date: 20181010

## 2019-11-06 DIAGNOSIS — D1801 Hemangioma of skin and subcutaneous tissue: Secondary | ICD-10-CM | POA: Diagnosis not present

## 2019-11-06 DIAGNOSIS — L820 Inflamed seborrheic keratosis: Secondary | ICD-10-CM | POA: Diagnosis not present

## 2019-11-06 NOTE — Progress Notes (Signed)
Carelink Summary Report / Loop Recorder 

## 2019-11-08 ENCOUNTER — Other Ambulatory Visit: Payer: Self-pay | Admitting: Internal Medicine

## 2019-11-09 ENCOUNTER — Other Ambulatory Visit: Payer: Self-pay | Admitting: Internal Medicine

## 2019-11-22 ENCOUNTER — Other Ambulatory Visit: Payer: Self-pay | Admitting: Internal Medicine

## 2019-12-05 LAB — CUP PACEART REMOTE DEVICE CHECK
Date Time Interrogation Session: 20211013000627
Implantable Pulse Generator Implant Date: 20181010

## 2019-12-06 ENCOUNTER — Ambulatory Visit (INDEPENDENT_AMBULATORY_CARE_PROVIDER_SITE_OTHER): Payer: PPO

## 2019-12-06 DIAGNOSIS — I639 Cerebral infarction, unspecified: Secondary | ICD-10-CM

## 2019-12-11 NOTE — Progress Notes (Signed)
Carelink Summary Report / Loop Recorder 

## 2019-12-12 ENCOUNTER — Other Ambulatory Visit: Payer: Self-pay

## 2019-12-12 ENCOUNTER — Ambulatory Visit (INDEPENDENT_AMBULATORY_CARE_PROVIDER_SITE_OTHER): Payer: PPO | Admitting: Internal Medicine

## 2019-12-12 ENCOUNTER — Telehealth: Payer: PPO

## 2019-12-12 ENCOUNTER — Encounter: Payer: Self-pay | Admitting: Internal Medicine

## 2019-12-12 VITALS — BP 123/75 | HR 65 | Temp 98.0°F | Resp 16 | Ht 70.0 in | Wt 182.1 lb

## 2019-12-12 DIAGNOSIS — Z79899 Other long term (current) drug therapy: Secondary | ICD-10-CM | POA: Diagnosis not present

## 2019-12-12 DIAGNOSIS — E785 Hyperlipidemia, unspecified: Secondary | ICD-10-CM | POA: Diagnosis not present

## 2019-12-12 DIAGNOSIS — R739 Hyperglycemia, unspecified: Secondary | ICD-10-CM | POA: Diagnosis not present

## 2019-12-12 DIAGNOSIS — Z0000 Encounter for general adult medical examination without abnormal findings: Secondary | ICD-10-CM | POA: Diagnosis not present

## 2019-12-12 DIAGNOSIS — F419 Anxiety disorder, unspecified: Secondary | ICD-10-CM | POA: Diagnosis not present

## 2019-12-12 DIAGNOSIS — F32A Depression, unspecified: Secondary | ICD-10-CM

## 2019-12-12 DIAGNOSIS — I10 Essential (primary) hypertension: Secondary | ICD-10-CM | POA: Diagnosis not present

## 2019-12-12 NOTE — Progress Notes (Signed)
Subjective:    Patient ID: Kevin Bass, male    DOB: [birth year], 79 y.o.   MRN: [id number]  DOS:  12/12/2019 Type of visit - description: CPX  Since the last office visit he is doing well. Saw neurology, No changes recommended.  Review of Systems  A 14 point review of systems is negative    Past Medical History:  Diagnosis Date  . Allergy   . Anxiety   . Arthritis    hands  . Atrial fibrillation ([place])   . Colon cancer [facility])    s/p partial colectomy 1990, Cscope neg 7-09, next 2014  . DISORDER, MITRAL VALVE    MV recontruction at [place] 2006----still needs ABX for SBE prophylaxis   . Elevated PSA    prostate nodule, [name]  . GERD (gastroesophageal reflux disease)   . Heart murmur    no problems since surgery  . HYPERGLYCEMIA, BORDERLINE 10/16/2008  . HYPERLIPIDEMIA 06/15/2006   diet controlled  . Hypertension   . HYPERTROPHY PROSTATE W/O UR OBST & OTH LUTS 10/16/2008  . Neuropathy   . Right groin pain    Dr. Redmond Pulling  . SCC (squamous cell carcinoma) 06/2017   s/p Mohs---[name]  . Stroke ([place])   . Wears partial dentures    lower partial    Past Surgical History:  Procedure Laterality Date  . COLECTOMY  1990  . HERNIA REPAIR Left 2014   [facility] rep w/mesh- March 2014  . LOOP RECORDER INSERTION N/A 12/01/2016   Procedure: LOOP RECORDER INSERTION;  Surgeon: Evans Lance, MD;  Location: [facility] CV LAB;  Service: Cardiovascular;  Laterality: N/A;  . MITRAL VALVE ANNULOPLASTY     [facility] 2006  . TEE WITHOUT CARDIOVERSION N/A 10/05/2016   Procedure: TRANSESOPHAGEAL ECHOCARDIOGRAM (TEE);  Surgeon: [name], MD;  Location: [facility] ENDOSCOPY;  Service: Cardiovascular;  Laterality: N/A;    Allergies as of 12/12/2019      Reactions   Statins Other (See Comments)   Myalgias (intolerance)      Medication List       Accurate as of December 12, 2019 11:59 PM. If you have any questions, ask your nurse or doctor.        acetaminophen 650 MG CR  tablet Commonly known as: TYLENOL Take 1,300 mg by mouth every 8 (eight) hours as needed for pain.   ALPRAZolam 0.25 MG tablet Commonly known as: XANAX TAKE 1 TABLET 2 TABLET BY MOUTH TWICE DAILY AS NEEDED FOR ANXIETY What changed:   how much to take  how to take this  when to take this  reasons to take this  additional instructions   amLODipine 5 MG tablet Commonly known as: NORVASC Take 1 tablet (5 mg total) by mouth daily.   Arginine 500 MG Caps Take 1,000 mg by mouth daily.   Baclofen 5 MG Tabs Take 1 tablet twice a day   Eliquis 5 MG Tabs tablet Generic drug: apixaban TAKE 1 TABLET BY MOUTH TWICE DAILY   FISH OIL PO Take 1 capsule by mouth in the morning and at bedtime.   gabapentin 300 MG capsule Commonly known as: NEURONTIN Take 1 capsule (300 mg total) by mouth See admin instructions. Take 300 mg by mouth four times a day- morning, 8:30 PM, 9:30 PM, and 10:00 PM   JOINT HEALTH PO Take 1 capsule by mouth daily.   loratadine 10 MG tablet Commonly known as: CLARITIN Take 10 mg by mouth at bedtime.   losartan  100 MG tablet Commonly known as: COZAAR Take 1 tablet (100 mg total) by mouth daily. What changed: how much to take   metoprolol succinate 25 MG 24 hr tablet Commonly known as: TOPROL-XL Take 3 tablets (75 mg total) by mouth daily.   NON FORMULARY Take 1-2 packets by mouth See admin instructions. Lypo-Spheric vitamin C gel packets- Mix 1-2 packets of gel into juice one to two times a day and drink   NON FORMULARY Take 10 mg by mouth See admin instructions. Jarrow Formulas Lyco-Sorb (for Prostate & Cardiovascular Health) 10 mg capsules: Take 10 mg by mouth two times a day   OCUVITE PRESERVISION PO Take 1 capsule by mouth 2 (two) times daily.   omeprazole 40 MG capsule Commonly known as: PRILOSEC Take 1 capsule (40 mg total) by mouth daily.   prednisoLONE acetate 1 % ophthalmic suspension Commonly known as: PRED FORTE Place 1 drop into the  right eye at bedtime.   rosuvastatin 40 MG tablet Commonly known as: CRESTOR TAKE 1 TABLET(40 MG) BY MOUTH DAILY          Objective:   Physical Exam BP 123/75 (BP Location: Left Arm, Patient Position: Sitting, Cuff Size: Small)   Pulse 65   Temp 98 F (36.7 C) (Oral)   Resp 16   Ht 5\' 10"  (1.778 m)   Wt 182 lb 2 oz (82.6 kg)   SpO2 98%   BMI 26.13 kg/m  General: Well developed, NAD, BMI noted Neck: No  thyromegaly  HEENT:  Normocephalic . Face symmetric, atraumatic Lungs:  CTA B Normal respiratory effort, no intercostal retractions, no accessory muscle use. Heart: RRR,  no murmur.  Abdomen:  Not distended, soft, non-tender. No rebound or rigidity.   Lower extremities: no pretibial edema bilaterally  Skin: Exposed areas without rash. Not pale. Not jaundice DRE: Normal sphincter tone, brown stools, prostate is nontender, at the right proximal lobule, there is a pellet-like nodule, approximately 3 mm. Neurologic:  alert & oriented X3.  Speech normal, gait appropriate for age and unassisted Strength symmetric and appropriate for age.  Psych: Cognition and judgment appear intact.  Cooperative with normal attention span and concentration.  Behavior appropriate. No anxious or depressed appearing.     Assessment      Assessment Prediabetes HTN Hyperlipidemia, statin intolerant (simva-pravachol: shoulder pain) Anxiety- rarely takes xanax Neuro  --Facial neuropathy dx 2015, no w/u, sx resurface x1 after temporary d/c of meds  --saw neuro again 02/2017, labs (-), had a NCS, Rx gaba -- strokes CV: --Mitral valve annuloplasty 2006-- needs SBE prophylaxis -- SOB 04-2016, seen at [facility], had a ECHO Nl fx MV, cath: no CAD --Cryptogenic stroke 09/12/2016, had slurred speech. Was recommended a loop recorder.started plavix -- new R PICA stroke, admitted 11-30-16, implanted loop recorder placed --A-FIB noted on ILR ~ 12-21-2016, changed to eliquis  -- 11/2017: Acute L  Hippocampus CVA with acute L posterior cerabral artery Elevated PSA and BPH-- Dr. [name] Colon cancer partial colectomy 1990, colonoscopy 08-2007 negative, Cscope again 09-2012 normal, 5 years  SCC . MOHS 06/2017  PLAN: Here for CPX Prediabetes: Check A1c HTN: He seems to be doing well on losartan 75 mg  (3/4 of a pill) and metoprolol 75 mg, amlodipine. Hyperlipidemia: On Crestor, last LFTs normal, check a FLP. Anxiety: On Xanax as needed only Paroxysmal A. fib: Seems to be on regular rate, anticoagulated, rate controlled. RTC 6 months       This visit occurred during the SARS-CoV-2 public  health emergency.  Safety protocols were in place, including screening questions prior to the visit, additional usage of staff PPE, and extensive cleaning of exam room while observing appropriate contact time as indicated for disinfecting solutions.

## 2019-12-12 NOTE — Progress Notes (Signed)
Pre visit review using our clinic review tool, if applicable. No additional management support is needed unless otherwise documented below in the visit note. 

## 2019-12-12 NOTE — Patient Instructions (Signed)
Check the  blood pressure regularly.  BP GOAL is between 110/65 and  135/85. If it is consistently higher or lower, let me know    GO TO THE LAB : Get the blood work     Kevin Bass, [place] back for a checkup in 6 months

## 2019-12-13 LAB — CBC WITH DIFFERENTIAL/PLATELET
Absolute Monocytes: 458 cells/uL (ref 200–950)
Basophils Absolute: 52 cells/uL (ref 0–200)
Basophils Relative: 0.9 %
Eosinophils Absolute: 151 cells/uL (ref 15–500)
Eosinophils Relative: 2.6 %
HCT: 40.2 % (ref 38.5–50.0)
Hemoglobin: 13.2 g/dL (ref 13.2–17.1)
Lymphs Abs: 1839 cells/uL (ref 850–3900)
MCH: 28.3 pg (ref 27.0–33.0)
MCHC: 32.8 g/dL (ref 32.0–36.0)
MCV: 86.1 fL (ref 80.0–100.0)
MPV: 10.6 fL (ref 7.5–12.5)
Monocytes Relative: 7.9 %
Neutro Abs: 3300 cells/uL (ref 1500–7800)
Neutrophils Relative %: 56.9 %
Platelets: 191 10*3/uL (ref 140–400)
RBC: 4.67 10*6/uL (ref 4.20–5.80)
RDW: 12.5 % (ref 11.0–15.0)
Total Lymphocyte: 31.7 %
WBC: 5.8 10*3/uL (ref 3.8–10.8)

## 2019-12-13 LAB — DRUG MONITORING, PANEL 8 WITH CONFIRMATION, URINE
6 Acetylmorphine: NEGATIVE ng/mL (ref <10)
Alcohol Metabolites: NEGATIVE ng/mL
Amphetamines: NEGATIVE ng/mL (ref <500)
Benzodiazepines: NEGATIVE ng/mL (ref <100)
Buprenorphine, Urine: NEGATIVE ng/mL (ref <5)
Cocaine Metabolite: NEGATIVE ng/mL (ref <150)
Creatinine: 105.2 mg/dL
MDMA: NEGATIVE ng/mL (ref <500)
Marijuana Metabolite: NEGATIVE ng/mL (ref <20)
Opiates: NEGATIVE ng/mL (ref <100)
Oxidant: NEGATIVE ug/mL
Oxycodone: NEGATIVE ng/mL (ref <100)
pH: 5.7 (ref 4.5–9.0)

## 2019-12-13 LAB — HEMOGLOBIN A1C
Hgb A1c MFr Bld: 5.6 % of total Hgb (ref <5.7)
Mean Plasma Glucose: 114 (calc)
eAG (mmol/L): 6.3 (calc)

## 2019-12-13 LAB — DM TEMPLATE

## 2019-12-13 LAB — BASIC METABOLIC PANEL
BUN: 23 mg/dL (ref 7–25)
CO2: 29 mmol/L (ref 20–32)
Calcium: 9.3 mg/dL (ref 8.6–10.3)
Chloride: 105 mmol/L (ref 98–110)
Creat: 1.08 mg/dL (ref 0.70–1.11)
Glucose, Bld: 87 mg/dL (ref 65–99)
Potassium: 4.5 mmol/L (ref 3.5–5.3)
Sodium: 141 mmol/L (ref 135–146)

## 2019-12-13 LAB — LIPID PANEL
Cholesterol: 101 mg/dL (ref <200)
HDL: 38 mg/dL — ABNORMAL LOW (ref >=40)
LDL Cholesterol (Calc): 49 mg/dL (calc)
Non-HDL Cholesterol (Calc): 63 mg/dL (calc) (ref <130)
Total CHOL/HDL Ratio: 2.7 (calc) (ref <5.0)
Triglycerides: 61 mg/dL (ref <150)

## 2019-12-14 ENCOUNTER — Encounter: Payer: Self-pay | Admitting: Internal Medicine

## 2019-12-14 NOTE — Assessment & Plan Note (Signed)
-  Td 2017 per pt - Pneumonia shot 2006, 2012 -  prevnar--2015   - S/p Zostavax per pt , no documentation - S/p shingrix  - s/p covid vax, rec booster, he is somewhat hesitant, benefits discussed, states that he will proceed - flu shot recommended -h/o colon ca: Last Cscope-- 09-2012 neg, due for a colonoscopy, he is reluctant to proceed due to his multiple medical problems.  He understand the rationale behind redoing a colonoscopy since he is high risk to develop polyps or even another cancer.  He will call me if he changes his mind. -Has a prostate nodule, last urology visit 12-2017,  at that time they felt a nodule at the left apex, less than 1 cm.  They felt there was no progression of the nodule and recommended to follow-up until age 62.  Patient is now 29, asx, DRE done today (see above).  Recommend no further eval.  Patient in agreement. -Labs reviewed: BMP, FLP, CBC, A1c

## 2019-12-14 NOTE — Assessment & Plan Note (Signed)
Here for CPX Prediabetes: Check A1c HTN: He seems to be doing well on losartan 75 mg  (3/4 of a pill) and metoprolol 75 mg, amlodipine. Hyperlipidemia: On Crestor, last LFTs normal, check a FLP. Anxiety: On Xanax as needed only Paroxysmal A. fib: Seems to be on regular rate, anticoagulated, rate controlled. RTC 6 months

## 2019-12-19 ENCOUNTER — Telehealth: Payer: Self-pay | Admitting: Neurology

## 2019-12-19 NOTE — Telephone Encounter (Signed)
Can you pls get more information on what he is going to do, his medical conditions listed do not typically excuse him from doing a deposition, pls clarify what they want him to do. It looks like he should be talking to the lawyer and let them know about his health concerns. Thanks

## 2019-12-19 NOTE — Telephone Encounter (Signed)
Patient is requesting a letter regarding cognitive status, strokes, and neuropathy. He is scheduled for a deposition on November 11 that he is wanting the letter for so that he won't have to go due to his medical conditions. Please call when ready.

## 2019-12-20 NOTE — Telephone Encounter (Signed)
Patient called again and said he wants it documented in the letter as well that he has trigeminal neuropathy and he can only drive for 15 minutes or less at at time.

## 2019-12-20 NOTE — Telephone Encounter (Signed)
He wants a letter stating that he has had 3 strokes and that he cant remember things,

## 2019-12-21 ENCOUNTER — Ambulatory Visit: Payer: PPO | Admitting: Pharmacist

## 2019-12-21 DIAGNOSIS — E785 Hyperlipidemia, unspecified: Secondary | ICD-10-CM

## 2019-12-21 DIAGNOSIS — I10 Essential (primary) hypertension: Secondary | ICD-10-CM

## 2019-12-21 DIAGNOSIS — R739 Hyperglycemia, unspecified: Secondary | ICD-10-CM

## 2019-12-21 NOTE — Chronic Care Management (AMB) (Signed)
Chronic Care Management Pharmacy  Name: Kevin Bass  MRN: [id number] DOB: Dec 04, 1940  Chief Complaint/ HPI  Kevin Bass,  79 y.o. , male presents for their Follow-Up CCM visit with the clinical pharmacist via telephone due to COVID-19 Pandemic.  PCP : Colon Branch, MD  Their chronic conditions include: AFib, Pre-DM, HTN, HLD, Hx of Stroke, Anxiety/Depression, GERD, Allergic Rhinitis, Trigeminal Neuralgia  Office Visits: 12/13/19: Visit w/ Dr. [name] - No med changes noted.   09/07/19: Visit w/ Dr. [name] - ER follow up. No med changes noted  Consult Visit: 10/31/19: Neuro visit w/ Dr. [name] - Most recent MRI 08/2019 showed now acute changes. No med changes noted. RTC 6 months.   Medications: Outpatient Encounter Medications as of 12/21/2019  Medication Sig  . acetaminophen (TYLENOL) 650 MG CR tablet Take 1,300 mg by mouth every 8 (eight) hours as needed for pain.  Marland Kitchen ALPRAZolam (XANAX) 0.25 MG tablet TAKE 1 TABLET 2 TABLET BY MOUTH TWICE DAILY AS NEEDED FOR ANXIETY (Patient taking differently: Take 0.25 mg by mouth 2 (two) times daily as needed for anxiety. )  . amLODipine (NORVASC) 5 MG tablet Take 1 tablet (5 mg total) by mouth daily.  . Arginine 500 MG CAPS Take 1,000 mg by mouth daily.   . Baclofen 5 MG TABS Take 1 tablet twice a Ingrid Shifrin  . ELIQUIS 5 MG TABS tablet TAKE 1 TABLET BY MOUTH TWICE DAILY  . gabapentin (NEURONTIN) 300 MG capsule Take 1 capsule (300 mg total) by mouth See admin instructions. Take 300 mg by mouth four times a Makensie Mulhall- morning, 8:30 PM, 9:30 PM, and 10:00 PM  . Glucosamine-MSM-Hyaluronic Acd (JOINT HEALTH PO) Take 1 capsule by mouth daily.   Marland Kitchen loratadine (CLARITIN) 10 MG tablet Take 10 mg by mouth at bedtime.   Marland Kitchen losartan (COZAAR) 100 MG tablet Take 1 tablet (100 mg total) by mouth daily. (Patient taking differently: Take 75 mg by mouth daily. )  . metoprolol succinate (TOPROL-XL) 25 MG 24 hr tablet Take 3 tablets (75 mg total) by mouth daily.  . Multiple  Vitamins-Minerals (OCUVITE PRESERVISION PO) Take 1 capsule by mouth 2 (two) times daily.   . NON FORMULARY Take 1-2 packets by mouth See admin instructions. Lypo-Spheric vitamin C gel packets- Mix 1-2 packets of gel into juice one to two times a Mykah Bellomo and drink  . NON FORMULARY Take 10 mg by mouth See admin instructions. Jarrow Formulas Lyco-Sorb (for Prostate & Cardiovascular Health) 10 mg capsules: Take 10 mg by mouth two times a Amirra Herling  . Omega-3 Fatty Acids (FISH OIL PO) Take 1 capsule by mouth in the morning and at bedtime.  Marland Kitchen omeprazole (PRILOSEC) 40 MG capsule Take 1 capsule (40 mg total) by mouth daily.  . prednisoLONE acetate (PRED FORTE) 1 % ophthalmic suspension Place 1 drop into the right eye at bedtime.  . rosuvastatin (CRESTOR) 40 MG tablet TAKE 1 TABLET(40 MG) BY MOUTH DAILY   No facility-administered encounter medications on file as of 12/21/2019.   SDOH Screenings   Alcohol Screen:   . Last Alcohol Screening Score (AUDIT): Not on file  Depression (PHQ2-9): Low Risk   . PHQ-2 Score: 0  Financial Resource Strain: Low Risk   . Difficulty of Paying Living Expenses: Not very hard  Food Insecurity:   . Worried About Charity fundraiser in the Last Year: Not on file  . Ran Out of Food in the Last Year: Not on file  Housing:   . Last  Housing Risk Score: Not on file  Physical Activity:   . Days of Exercise per Week: Not on file  . Minutes of Exercise per Session: Not on file  Social Connections:   . Frequency of Communication with Friends and Family: Not on file  . Frequency of Social Gatherings with Friends and Family: Not on file  . Attends Religious Services: Not on file  . Active Member of Clubs or Organizations: Not on file  . Attends Archivist Meetings: Not on file  . Marital Status: Not on file  Stress:   . Feeling of Stress : Not on file  Tobacco Use: Medium Risk  . Smoking Tobacco Use: Former Smoker  . Smokeless Tobacco Use: Never Used  Transportation  Needs:   . Film/video editor (Medical): Not on file  . Lack of Transportation (Non-Medical): Not on file      Current Diagnosis/Assessment:  Goals Addressed            This Visit's Progress   . Chronic Care Management Pharmacy Care Plan       CARE PLAN ENTRY (see longitudinal plan of care for additional care plan information)  Current Barriers:  . Chronic Disease Management support, education, and care coordination needs related to  AFib, Pre-DM, HTN, HLD, Hx of Stroke, Anxiety/Depression, GERD, Allergic Rhinitis, Trigeminal Neuralgia   Hypertension BP Readings from Last 3 Encounters:  12/12/19 123/75  10/31/19 102/67  09/07/19 125/80   . Pharmacist Clinical Goal(s): o Over the next 180 days, patient will work with PharmD and providers to maintain BP goal <130/80 . Current regimen:   Amlodipine 5mg  daily  Losartan 75mg  daily   Metoprolol succinate 75mg  daily . Interventions: o Discussed BP goal . Patient self care activities - Over the next 180 days, patient will: o Check BP 3-4 times per week, document, and provide at future appointments o Ensure daily salt intake < 2300 mg/Marlane Hirschmann  Hyperlipidemia Lab Results  Component Value Date/Time   LDLCALC 49 12/12/2019 01:56 PM   LDLDIRECT 149.1 08/30/2012 01:52 PM   . Pharmacist Clinical Goal(s): o Over the next 180 days, patient will work with PharmD and providers to maintain LDL goal < 70 . Current regimen:   Ezetimibe 10mg  daily  Rosuvastatin 40mg  daily  Omega 3 1600mg  daily . Interventions: o Discussed LDL goal  . Patient self care activities - Over the next 180 days, patient will: o Maintain cholesterol medication regimen.   Query Pre-Diabetes Lab Results  Component Value Date/Time   HGBA1C 5.6 12/12/2019 01:56 PM   HGBA1C 5.9 12/11/2018 11:01 AM   . Pharmacist Clinical Goal(s): o Over the next 180 days, patient will work with PharmD and providers to maintain A1c goal  <5.7% . Current regimen:   o Diet and exercise management   . Interventions: o Discussed goal to remain below pre-diabetes a1c range (5.7% - 6.4%) . Patient self care activities - Over the next 180 days, patient will: o Maintain a1c <5.7%  Medication management . Pharmacist Clinical Goal(s): o Over the next 180 days, patient will work with PharmD and providers to maintain optimal medication adherence . Current pharmacy: [facility] . Interventions o Comprehensive medication review performed. o Continue current medication management strategy . Patient self care activities - Over the next 180 days, patient will: o Focus on medication adherence by filling and taking medications appropriately  o Take medications as prescribed o Report any questions or concerns to PharmD and/or provider(s)  Please see past updates related  to this goal by clicking on the "Past Updates" button in the selected goal        Social Hx: From [place]. Came to [place] when he was 74. Lived in [place] for 4 years. Wife passed away 09-18-17. 2 kids (daughter in [place], son in [place]). 2 grandkids.  Dr. [name] patient for over 30 years.  Uses a list that he has on his counter.  Uses pill box for a few days a week  AFIB   Patient is currently rate controlled.  Denies any current symptoms. Has had multiple strokes Has had mitral valve repair Cardio recommended that he start Eliquis  Patient has failed these meds in past: None noted Patient is currently controlled on the following medications:   Eliquis 5mg  BID  Metoprolol succinate 75mg  daily  We discussed:  importance of medication adherence   Update 12/21/19 No symptoms  Plan -Continue current medications   Query Pre-Diabetes   A1c goal <5.7%  Recent Relevant Labs: Lab Results  Component Value Date/Time   HGBA1C 5.6 12/12/2019 01:56 PM   HGBA1C 5.9 12/11/2018 11:01 AM    Patient is currently controlled on the following medications: None noted  We discussed: diet  and exercise extensively   Update 12/21/19 A1c below pre-diabetes range. Congratulated patient on this!  Plan -Continue control with diet and exercise   Hypertension   BP goal <130/80  CMP Latest Ref Rng & Units 12/12/2019 08/30/2019 06/14/2019  Glucose 65 - 99 mg/dL 87 114(H) 82  BUN 7 - 25 mg/dL 23 17 22   Creatinine 0.70 - 1.11 mg/dL 1.08 1.07 1.08  Sodium 135 - 146 mmol/L 141 140 140  Potassium 3.5 - 5.3 mmol/L 4.5 4.5 4.6  Chloride 98 - 110 mmol/L 105 103 103  CO2 20 - 32 mmol/L 29 27 34(H)  Calcium 8.6 - 10.3 mg/dL 9.3 9.4 9.1  Total Protein 6.5 - 8.1 g/dL - 7.2 -  Total Bilirubin 0.3 - 1.2 mg/dL - 1.0 -  Alkaline Phos 38 - 126 U/L - 47 -  AST 15 - 41 U/L - 21 -  ALT 0 - 44 U/L - 17 -   Office blood pressures are  BP Readings from Last 3 Encounters:  12/12/19 123/75  10/31/19 102/67  09/07/19 125/80   Patient has failed these meds in the past: None noted  Patient is currently controlled on the following medications:   Amlodipine 5mg  daily  Losartan 75mg  daily (pt did not continue with 100mg )  Metoprolol succinate 75mg  daily  Patient states he picked up prescription for losartan 100mg , but felt his blood pressure was getting normal and that he would be ok with 75mg  of losartan. He is currently [place] #3 tablets of losartan 25mg  and #3 tablets of metoprolol succinate 25mg    Patient is cautious about increased dose of losartan thinking it may be too much.   Patient checks BP at home daily  Patient home BP readings are ranging: 120-130s/60s-70s  We discussed: Proper blood pressure measurement technique, signs and symptoms of low pressure and checking blood pressure if those symptoms occur  Update 12/21/19 Checking BP every 2-3 days, but not recording readings. He is open to recording readings  Plan -Check blood pressure 3-4 times per week and record readings -Continue current medications   Hyperlipidemia/Hx of Stroke   LDL Goal <70  Lipid Panel      Component Value Date/Time   CHOL 101 12/12/2019 1356   TRIG 61 12/12/2019 1356   HDL  38 (L) 12/12/2019 1356   CHOLHDL 2.7 12/12/2019 1356   VLDL 25.4 12/11/2018 1101   LDLCALC 49 12/12/2019 1356   LDLDIRECT 149.1 08/30/2012 1352     ASCVD 10-year risk: Hx of ASCVD  Patient has failed these meds in past: simvastatin, pravastatin Patient is currently controlled on the following medications:   Ezetimibe 10mg  daily  Rosuvastatin 40mg  daily  Omega 3 1600mg  daily   Update 12/21/19 LDL still at goal  Plan -Continue current medications  Trigeminal Neuralgia   Followed by Neuro (Dr. [name])  Patient has failed these meds in past: None noted Patient is currently controlled on the following medications:   Baclofen 5mg  twice daily  Gabapentin 300mg  #1 AM, #3 caps PM (8:30pm, 9:30pm, 10:30pm)  Plan -Continue current medications    Miscellanous Meds Information Retention Fish oil 1,100mg  KSM - 66 300mg   Gingko 60mg  [place] 50mg     Melvenia Beam Staley Budzinski, PharmD Clinical Pharmacist [facility] at [facility] [phone]

## 2019-12-21 NOTE — Patient Instructions (Addendum)
Visit Information  Goals Addressed            This Visit's Progress   . Chronic Care Management Pharmacy Care Plan       CARE PLAN ENTRY (see longitudinal plan of care for additional care plan information)  Current Barriers:  . Chronic Disease Management support, education, and care coordination needs related to  AFib, Pre-DM, HTN, HLD, Hx of Stroke, Anxiety/Depression, GERD, Allergic Rhinitis, Trigeminal Neuralgia   Hypertension BP Readings from Last 3 Encounters:  12/12/19 123/75  10/31/19 102/67  09/07/19 125/80   . Pharmacist Clinical Goal(s): o Over the next 180 days, patient will work with PharmD and providers to maintain BP goal <130/80 . Current regimen:   Amlodipine 5mg  daily  Losartan 75mg  daily   Metoprolol succinate 75mg  daily . Interventions: o Discussed BP goal . Patient self care activities - Over the next 180 days, patient will: o Check BP 3-4 times per week, document, and provide at future appointments o Ensure daily salt intake < 2300 mg/Lanaiya Lantry  Hyperlipidemia Lab Results  Component Value Date/Time   LDLCALC 49 12/12/2019 01:56 PM   LDLDIRECT 149.1 08/30/2012 01:52 PM   . Pharmacist Clinical Goal(s): o Over the next 180 days, patient will work with PharmD and providers to maintain LDL goal < 70 . Current regimen:   Ezetimibe 10mg  daily  Rosuvastatin 40mg  daily  Omega 3 1600mg  daily . Interventions: o Discussed LDL goal  . Patient self care activities - Over the next 180 days, patient will: o Maintain cholesterol medication regimen.   Query Pre-Diabetes Lab Results  Component Value Date/Time   HGBA1C 5.6 12/12/2019 01:56 PM   HGBA1C 5.9 12/11/2018 11:01 AM   . Pharmacist Clinical Goal(s): o Over the next 180 days, patient will work with PharmD and providers to maintain A1c goal  <5.7% . Current regimen:  o Diet and exercise management   . Interventions: o Discussed goal to remain below pre-diabetes a1c range (5.7% - 6.4%) . Patient  self care activities - Over the next 180 days, patient will: o Maintain a1c <5.7%  Medication management . Pharmacist Clinical Goal(s): o Over the next 180 days, patient will work with PharmD and providers to maintain optimal medication adherence . Current pharmacy: [facility] . Interventions o Comprehensive medication review performed. o Continue current medication management strategy . Patient self care activities - Over the next 180 days, patient will: o Focus on medication adherence by filling and taking medications appropriately  o Take medications as prescribed o Report any questions or concerns to PharmD and/or provider(s)  Please see past updates related to this goal by clicking on the "Past Updates" button in the selected goal         The patient verbalized understanding of instructions provided today and agreed to receive a mailed copy of patient instruction and/or educational materials.  Telephone follow up appointment with pharmacy team member scheduled for: 06/20/2020  Melvenia Beam Darnella Zeiter, PharmD Clinical Pharmacist [facility] at [facility] [phone]

## 2019-12-26 ENCOUNTER — Encounter: Payer: Self-pay | Admitting: Neurology

## 2019-12-26 NOTE — Telephone Encounter (Signed)
Letter done, thanks.

## 2019-12-26 NOTE — Telephone Encounter (Signed)
Patient called regarding letter he is requesting. He states that his attorney is advising him to get the letter regarding his 3 strokes that he's had, with the last one causing a lot of memory issues. They are wanting to use the letter to aid in getting him out of the deposition. He is requesting the letter be completed ASAP since the deposition is on 01/03/20 and they need it prior to that date. Patient will call back with attorney's fax number for letter to be sent there once completed but is also asking for a phone call to notify him once it's completed and sent. Thanks!

## 2019-12-26 NOTE — Telephone Encounter (Signed)
Patient called back to update where to fax letter to:  [facility] office) - Ephraim Hamburger (attorney), fax # [phone]. Please call patient when completed and faxed. He would also like a copy mailed to him.

## 2019-12-27 ENCOUNTER — Other Ambulatory Visit: Payer: Self-pay | Admitting: Neurology

## 2020-01-06 LAB — CUP PACEART REMOTE DEVICE CHECK
Date Time Interrogation Session: 20211112230807
Implantable Pulse Generator Implant Date: 20181010

## 2020-01-07 ENCOUNTER — Ambulatory Visit (INDEPENDENT_AMBULATORY_CARE_PROVIDER_SITE_OTHER): Payer: PPO

## 2020-01-07 DIAGNOSIS — I639 Cerebral infarction, unspecified: Secondary | ICD-10-CM | POA: Diagnosis not present

## 2020-01-09 NOTE — Progress Notes (Signed)
Carelink Summary Report / Loop Recorder 

## 2020-01-22 ENCOUNTER — Encounter: Payer: Self-pay | Admitting: Internal Medicine

## 2020-01-22 ENCOUNTER — Other Ambulatory Visit: Payer: Self-pay | Admitting: Internal Medicine

## 2020-02-06 LAB — CUP PACEART REMOTE DEVICE CHECK
Date Time Interrogation Session: 20211214002535
Implantable Pulse Generator Implant Date: 20181010

## 2020-02-07 ENCOUNTER — Ambulatory Visit (INDEPENDENT_AMBULATORY_CARE_PROVIDER_SITE_OTHER): Payer: PPO

## 2020-02-07 DIAGNOSIS — I639 Cerebral infarction, unspecified: Secondary | ICD-10-CM

## 2020-02-09 ENCOUNTER — Other Ambulatory Visit: Payer: Self-pay | Admitting: Internal Medicine

## 2020-02-21 NOTE — Progress Notes (Signed)
Carelink Summary Report / Loop Recorder 

## 2020-02-22 ENCOUNTER — Other Ambulatory Visit: Payer: Self-pay | Admitting: Internal Medicine

## 2020-02-25 ENCOUNTER — Telehealth: Payer: Self-pay | Admitting: Pharmacist

## 2020-02-25 NOTE — Progress Notes (Addendum)
Chronic Care Management Pharmacy Assistant   Name: Keona Sheffler  MRN: [id number] DOB: 01/26/1941  Reason for Encounter: HTN Disease State  Patient Questions:  1.  Have you seen any other providers since your last visit? Yes  2.  Any changes in your medicines or health? No    PCP : Colon Branch, MD   Their chronic conditions include: AFib, Pre-DM, HTN, HLD, Hx of Stroke, Anxiety/Depression, GERD, Allergic Rhinitis, Trigeminal Neuralgia.  Office Visit: None since their last CCM visit with the clinical pharmacist on 12-21-2019.  Consults: 02-07-2020: (Cardio) OV with RN Juleen China for CVA.   01-07-2020 (Cardio) OV with RN Juleen China for CVA.   Allergies:   Allergies  Allergen Reactions   Statins Other (See Comments)    Myalgias (intolerance)    Medications: Outpatient Encounter Medications as of 02/25/2020  Medication Sig   acetaminophen (TYLENOL) 650 MG CR tablet Take 1,300 mg by mouth every 8 (eight) hours as needed for pain.   ALPRAZolam (XANAX) 0.25 MG tablet TAKE 1 TABLET 2 TABLET BY MOUTH TWICE DAILY AS NEEDED FOR ANXIETY (Patient taking differently: Take 0.25 mg by mouth 2 (two) times daily as needed for anxiety. )   amLODipine (NORVASC) 5 MG tablet Take 1 tablet (5 mg total) by mouth daily.   Arginine 500 MG CAPS Take 1,000 mg by mouth daily.    Baclofen 5 MG TABS TAKE 1 TABLET BY MOUTH EVERY NIGHT FOR 3 DAYS THEN INCREASE TO 1 TABLET TWICE DAILY   ELIQUIS 5 MG TABS tablet TAKE 1 TABLET BY MOUTH TWICE DAILY   gabapentin (NEURONTIN) 300 MG capsule Take 1 capsule (300 mg total) by mouth See admin instructions. Take 300 mg by mouth four times a day- morning, 8:30 PM, 9:30 PM, and 10:00 PM   Glucosamine-MSM-Hyaluronic Acd (JOINT HEALTH PO) Take 1 capsule by mouth daily.    loratadine (CLARITIN) 10 MG tablet Take 10 mg by mouth at bedtime.    losartan (COZAAR) 100 MG tablet Take 1 tablet (100 mg total) by mouth daily. (Patient taking differently: Take 75 mg by mouth daily.  )   metoprolol succinate (TOPROL-XL) 25 MG 24 hr tablet Take 3 tablets (75 mg total) by mouth daily.   Multiple Vitamins-Minerals (OCUVITE PRESERVISION PO) Take 1 capsule by mouth 2 (two) times daily.    NON FORMULARY Take 1-2 packets by mouth See admin instructions. Lypo-Spheric vitamin C gel packets- Mix 1-2 packets of gel into juice one to two times a day and drink   NON FORMULARY Take 10 mg by mouth See admin instructions. Jarrow Formulas Lyco-Sorb (for Prostate & Cardiovascular Health) 10 mg capsules: Take 10 mg by mouth two times a day   Omega-3 Fatty Acids (FISH OIL PO) Take 1 capsule by mouth in the morning and at bedtime.   omeprazole (PRILOSEC) 40 MG capsule Take 1 capsule (40 mg total) by mouth daily.   prednisoLONE acetate (PRED FORTE) 1 % ophthalmic suspension Place 1 drop into the right eye at bedtime.   rosuvastatin (CRESTOR) 40 MG tablet Take 1 tablet (40 mg total) by mouth daily.   No facility-administered encounter medications on file as of 02/25/2020.    Current Diagnosis: Patient Active Problem List   Diagnosis Date Noted   Chronic rhinitis 03/29/2019   TIA (transient ischemic attack) 12/03/2017   Chronic anticoagulation 12/03/2017   H/O: stroke 12/03/2017   Paroxysmal atrial fibrillation ([place]) 07/11/2017   SCC (squamous cell carcinoma) 06/22/2017   Varicose veins of both  lower extremities 06/19/2017   Bilateral lower extremity edema 06/19/2017   Trichiasis without entropion of right lower eyelid 04/14/2017   Paresthesias 03/03/2017   Trigeminal neuralgia of right side of face 10/01/2016   History of mitral valve repair 09/30/2016   Cerebrovascular accident (CVA) ([place]) 09/12/2016   GERD (gastroesophageal reflux disease) 09/22/2015   PCP NOTES >>>>> 11/08/2014   Chest pain 06/07/2014   Facial neuropathy 04/02/2013   Anxiety and depression 02/26/2013   Annual physical exam 08/30/2012   HTN (hypertension) 01/22/2011   BPH (benign prostatic hyperplasia) 10/16/2008    Hyperglycemia 10/16/2008   Hyperlipidemia 06/15/2006   Mitral valve disease 06/15/2006   Allergic rhinitis 06/15/2006   COLON CANCER, HX OF 06/15/2006    Goals Addressed   None    Reviewed chart prior to disease state call. Spoke with patient regarding BP  Recent Office Vitals: BP Readings from Last 3 Encounters:  12/12/19 123/75  10/31/19 102/67  09/07/19 125/80   Pulse Readings from Last 3 Encounters:  12/12/19 65  10/31/19 68  09/07/19 60    Wt Readings from Last 3 Encounters:  12/12/19 182 lb 2 oz (82.6 kg)  10/31/19 182 lb (82.6 kg)  09/07/19 181 lb 4 oz (82.2 kg)     Kidney Function Lab Results  Component Value Date/Time   CREATININE 1.08 12/12/2019 01:56 PM   CREATININE 1.07 08/30/2019 03:08 PM   CREATININE 1.08 06/14/2019 01:58 PM   CREATININE 1.02 09/27/2016 12:30 PM   GFR 65.65 06/14/2019 01:58 PM   GFRNONAA >60 08/30/2019 03:08 PM   GFRAA >60 08/30/2019 03:08 PM    BMP Latest Ref Rng & Units 12/12/2019 08/30/2019 06/14/2019  Glucose 65 - 99 mg/dL 87 114(H) 82  BUN 7 - 25 mg/dL 23 17 22   Creatinine 0.70 - 1.11 mg/dL 1.08 1.07 1.08  BUN/Creat Ratio 6 - 22 (calc) NOT APPLICABLE - -  Sodium 160 - 146 mmol/L 141 140 140  Potassium 3.5 - 5.3 mmol/L 4.5 4.5 4.6  Chloride 98 - 110 mmol/L 105 103 103  CO2 20 - 32 mmol/L 29 27 34(H)  Calcium 8.6 - 10.3 mg/dL 9.3 9.4 9.1    Current antihypertensive regimen:  Amlodipine 5 mg daily Losartan 75 mg daily  Metoprolol Succinate 75 mg daily  How often are you checking your Blood Pressure? daily   Current home BP readings: 128/66 pulse 68, 129/63 pulse 64, 113/63 pulse 68  What recent interventions/DTPs have been made by any provider to improve Blood Pressure control since last CPP Visit: None  Any recent hospitalizations or ED visits since last visit with CPP? No   What diet changes have been made to improve Blood Pressure Control?  Patient reports refraining from meat. His diet usually consists of  fish.  What exercise is being done to improve your Blood Pressure Control?  Patient states he does have neuropathy on the right side, but does walk as much as he can. He is independent with his day to day activities.   Patient states he had a small gathering for the holidays with his daughter and her husband. He has been feeling well and getting around despite his neuropathy. He does not have any issues or concerns with medications and able to obtain his medicines when needed.  Adherence Review: Is the patient currently on ACE/ARB medication? Yes Losartan 100 MG tablet Does the patient have >5 day gap between last estimated fill dates? Yes    Follow-Up:  Pharmacist Review   Fanny Skates,  [place] Clinical Pharmacist Assistant [phone]  Noted >5 day gap history per dispense report. Need to assure adherence. -LXBW Fill Dates Per Dispense Report - Filled Inappropriately  Losartan: 10/26/19 90 DS  3 minutes spent in review, coordination, and documentation.   Reviewed by: De Blanch, PharmD, BCACP Clinical Pharmacist [facility] at [facility] [phone]

## 2020-03-10 ENCOUNTER — Ambulatory Visit (INDEPENDENT_AMBULATORY_CARE_PROVIDER_SITE_OTHER): Payer: PPO

## 2020-03-10 DIAGNOSIS — I639 Cerebral infarction, unspecified: Secondary | ICD-10-CM | POA: Diagnosis not present

## 2020-03-12 LAB — CUP PACEART REMOTE DEVICE CHECK
Date Time Interrogation Session: 20220114002757
Implantable Pulse Generator Implant Date: 20181010

## 2020-03-25 NOTE — Progress Notes (Signed)
Carelink Summary Report / Loop Recorder 

## 2020-03-26 ENCOUNTER — Telehealth: Payer: Self-pay | Admitting: Pharmacist

## 2020-03-26 ENCOUNTER — Telehealth: Payer: Self-pay | Admitting: Neurology

## 2020-03-26 NOTE — Progress Notes (Addendum)
Chronic Care Management Pharmacy Assistant   Name: Kevin Bass  MRN: [id number] DOB: [birth year]  Reason for Encounter: Disease State For HTN.  Patient Questions:  1.  Have you seen any other providers since your last visit? No.   2.  Any changes in your medicines or health? No.    PCP : Colon Branch, MD   Their chronic conditions include: AFib, Pre-DM, HTN, HLD, Hx of Stroke, Anxiety/Depression, GERD, Allergic Rhinitis, Trigeminal Neuralgia.  Office Visits: None since 02/25/20  Consults: None since 02/25/20  Allergies:   Allergies  Allergen Reactions   Statins Other (See Comments)    Myalgias (intolerance)    Medications: Outpatient Encounter Medications as of 03/26/2020  Medication Sig   acetaminophen (TYLENOL) 650 MG CR tablet Take 1,300 mg by mouth every 8 (eight) hours as needed for pain.   ALPRAZolam (XANAX) 0.25 MG tablet TAKE 1 TABLET 2 TABLET BY MOUTH TWICE DAILY AS NEEDED FOR ANXIETY (Patient taking differently: Take 0.25 mg by mouth 2 (two) times daily as needed for anxiety. )   amLODipine (NORVASC) 5 MG tablet Take 1 tablet (5 mg total) by mouth daily.   Arginine 500 MG CAPS Take 1,000 mg by mouth daily.    Baclofen 5 MG TABS TAKE 1 TABLET BY MOUTH EVERY NIGHT FOR 3 DAYS THEN INCREASE TO 1 TABLET TWICE DAILY   ELIQUIS 5 MG TABS tablet TAKE 1 TABLET BY MOUTH TWICE DAILY   gabapentin (NEURONTIN) 300 MG capsule Take 1 capsule (300 mg total) by mouth See admin instructions. Take 300 mg by mouth four times a day- morning, 8:30 PM, 9:30 PM, and 10:00 PM   Glucosamine-MSM-Hyaluronic Acd (JOINT HEALTH PO) Take 1 capsule by mouth daily.    loratadine (CLARITIN) 10 MG tablet Take 10 mg by mouth at bedtime.    losartan (COZAAR) 100 MG tablet Take 1 tablet (100 mg total) by mouth daily. (Patient taking differently: Take 75 mg by mouth daily. )   metoprolol succinate (TOPROL-XL) 25 MG 24 hr tablet Take 3 tablets (75 mg total) by mouth daily.   Multiple  Vitamins-Minerals (OCUVITE PRESERVISION PO) Take 1 capsule by mouth 2 (two) times daily.    NON FORMULARY Take 1-2 packets by mouth See admin instructions. Lypo-Spheric vitamin C gel packets- Mix 1-2 packets of gel into juice one to two times a day and drink   NON FORMULARY Take 10 mg by mouth See admin instructions. Jarrow Formulas Lyco-Sorb (for Prostate & Cardiovascular Health) 10 mg capsules: Take 10 mg by mouth two times a day   Omega-3 Fatty Acids (FISH OIL PO) Take 1 capsule by mouth in the morning and at bedtime.   omeprazole (PRILOSEC) 40 MG capsule Take 1 capsule (40 mg total) by mouth daily.   prednisoLONE acetate (PRED FORTE) 1 % ophthalmic suspension Place 1 drop into the right eye at bedtime.   rosuvastatin (CRESTOR) 40 MG tablet Take 1 tablet (40 mg total) by mouth daily.   No facility-administered encounter medications on file as of 03/26/2020.    Current Diagnosis: Patient Active Problem List   Diagnosis Date Noted   Chronic rhinitis 03/29/2019   TIA (transient ischemic attack) 12/03/2017   Chronic anticoagulation 12/03/2017   H/O: stroke 12/03/2017   Paroxysmal atrial fibrillation ([place]) 07/11/2017   SCC (squamous cell carcinoma) 06/22/2017   Varicose veins of both lower extremities 06/19/2017   Bilateral lower extremity edema 06/19/2017   Trichiasis without entropion of right lower eyelid 04/14/2017   Paresthesias  03/03/2017   Trigeminal neuralgia of right side of face 10/01/2016   History of mitral valve repair 09/30/2016   Cerebrovascular accident (CVA) ([place]) 09/12/2016   GERD (gastroesophageal reflux disease) 09/22/2015   PCP NOTES >>>>> 11/08/2014   Chest pain 06/07/2014   Facial neuropathy 04/02/2013   Anxiety and depression 02/26/2013   Annual physical exam 08/30/2012   HTN (hypertension) 01/22/2011   BPH (benign prostatic hyperplasia) 10/16/2008   Hyperglycemia 10/16/2008   Hyperlipidemia 06/15/2006   Mitral valve disease 06/15/2006   Allergic rhinitis  06/15/2006   COLON CANCER, HX OF 06/15/2006    Goals Addressed   None    Reviewed chart prior to disease state call. Spoke with patient regarding BP  Recent Office Vitals: BP Readings from Last 3 Encounters:  12/12/19 123/75  10/31/19 102/67  09/07/19 125/80   Pulse Readings from Last 3 Encounters:  12/12/19 65  10/31/19 68  09/07/19 60    Wt Readings from Last 3 Encounters:  12/12/19 182 lb 2 oz (82.6 kg)  10/31/19 182 lb (82.6 kg)  09/07/19 181 lb 4 oz (82.2 kg)     Kidney Function Lab Results  Component Value Date/Time   CREATININE 1.08 12/12/2019 01:56 PM   CREATININE 1.07 08/30/2019 03:08 PM   CREATININE 1.08 06/14/2019 01:58 PM   CREATININE 1.02 09/27/2016 12:30 PM   GFR 65.65 06/14/2019 01:58 PM   GFRNONAA >60 08/30/2019 03:08 PM   GFRAA >60 08/30/2019 03:08 PM    BMP Latest Ref Rng & Units 12/12/2019 08/30/2019 06/14/2019  Glucose 65 - 99 mg/dL 87 114(H) 82  BUN 7 - 25 mg/dL 23 17 22   Creatinine 0.70 - 1.11 mg/dL 1.08 1.07 1.08  BUN/Creat Ratio 6 - 22 (calc) NOT APPLICABLE - -  Sodium 921 - 146 mmol/L 141 140 140  Potassium 3.5 - 5.3 mmol/L 4.5 4.5 4.6  Chloride 98 - 110 mmol/L 105 103 103  CO2 20 - 32 mmol/L 29 27 34(H)  Calcium 8.6 - 10.3 mg/dL 9.3 9.4 9.1    Current antihypertensive regimen:  Amlodipine 5 mg daily Losartan 75 mg daily  Metoprolol Succinate 75 mg daily  How often are you checking your Blood Pressure? Patient stated he usually checks his blood pressure daily .  Current home BP readings:   03/25/20 119/64    03/25/19 111/60    03/23/19 119/67  03/19/19 118/70  What recent interventions/DTPs have been made by any provider to improve Blood Pressure control since last CPP Visit: None  Any recent hospitalizations or ED visits since last visit with CPP? No.  What diet changes have been made to improve Blood Pressure Control?  Patient stated he doesn't eat red meat at all. He stated he eats fruit tries to eat vegetables. He stated  he usually eat three times a day.Patient stated he drinks water, orange juice , and coffee.  What exercise is being done to improve your Blood Pressure Control?  Patient stated he usually walks daily for about 1 mile when the weather premits. Patient stated he does the house duties around the house like cooking and cleaning.   Adherence Review: Is the patient currently on ACE/ARB medication? Yes, Losartan 100 mg  Does the patient have >5 day gap between last estimated fill dates? No  Patient stated he cuts his Losartan100 mg into 75 mg tablet. He stated he cuts the pill in half and then cuts the half of the pill in half again because his blood pressure was running to low  when he took the whole 100 mg tablet.    Follow-Up:  Pharmacist Review   Charlann Lange, RMA Clinical Pharmacist Assistant [phone]  8 minutes spent in review, coordination, and documentation.  Reviewed by: Beverly Milch, PharmD Clinical Pharmacist [facility] [phone]

## 2020-03-26 NOTE — Telephone Encounter (Signed)
Patient called in stating he was having some trouble with his neuropathy in his right eye. It seems to be getting worse. The pain goes down to his teeth when he chews. He would like some advice.

## 2020-03-26 NOTE — Telephone Encounter (Signed)
Patient is already taken gabapentin 300 qid. States the pains are into his teeth.

## 2020-03-27 ENCOUNTER — Other Ambulatory Visit: Payer: Self-pay | Admitting: Internal Medicine

## 2020-03-27 NOTE — Telephone Encounter (Signed)
He is having another flare up of his trigeminal neuralgia. He can temporarily increase the Baclofen 5mg  again to three times a day, but would not drive because this made his drowsy in the past. Once the flare quiets down, he can go back to taking it twice a day. Thanks

## 2020-03-27 NOTE — Telephone Encounter (Signed)
Patient advised of he above.

## 2020-03-27 NOTE — Telephone Encounter (Signed)
65m, 82.6kg, scr 1.08 12/12/19, lovw/hilty 05/29/19

## 2020-04-01 ENCOUNTER — Telehealth: Payer: Self-pay | Admitting: Internal Medicine

## 2020-04-01 ENCOUNTER — Other Ambulatory Visit: Payer: Self-pay

## 2020-04-01 MED ORDER — BACLOFEN 5 MG PO TABS
ORAL_TABLET | ORAL | 4 refills | Status: DC
Start: 2020-04-01 — End: 2020-04-21

## 2020-04-01 NOTE — Telephone Encounter (Signed)
Refill called in for pt to pharmacy on file

## 2020-04-01 NOTE — Telephone Encounter (Signed)
Patient called in stating he is going to need a refill of his Baclofen since he is taking it more now.

## 2020-04-02 NOTE — Telephone Encounter (Signed)
PDMP reviewed, prescription sent

## 2020-04-02 NOTE — Telephone Encounter (Signed)
Requesting: alprazolam 0.25mg  Contract: 12/12/2019 UDS: 12/12/2019 Last Visit: 12/12/2019 Next Visit: 06/11/2020 Last Refill: 02/22/2019 #40 and 1RF Pt sig: 1 tab bid prn  Please Advise

## 2020-04-10 ENCOUNTER — Ambulatory Visit (INDEPENDENT_AMBULATORY_CARE_PROVIDER_SITE_OTHER): Payer: PPO

## 2020-04-10 DIAGNOSIS — I639 Cerebral infarction, unspecified: Secondary | ICD-10-CM

## 2020-04-10 LAB — CUP PACEART REMOTE DEVICE CHECK
Date Time Interrogation Session: 20220216233158
Implantable Pulse Generator Implant Date: 20181010

## 2020-04-16 NOTE — Progress Notes (Signed)
Carelink Summary Report / Loop Recorder 

## 2020-04-21 ENCOUNTER — Telehealth: Payer: Self-pay | Admitting: Pharmacist

## 2020-04-21 ENCOUNTER — Telehealth: Payer: Self-pay | Admitting: Neurology

## 2020-04-21 MED ORDER — BACLOFEN 5 MG PO TABS
ORAL_TABLET | ORAL | 4 refills | Status: DC
Start: 2020-04-21 — End: 2020-06-03

## 2020-04-21 NOTE — Telephone Encounter (Signed)
Patient requesting a new prescription baclofen 5mg  for pain from his eye to his teeth. He said he went up to 3 tablets from 2 and it is helping with the pain. He just needs a new prescription.  [facility] on [address]

## 2020-04-21 NOTE — Telephone Encounter (Signed)
Pt advised.

## 2020-04-21 NOTE — Telephone Encounter (Signed)
Rx sent for Baclofen 5mg : Take 1 tab three times a day as needed for pain. Thanks

## 2020-04-21 NOTE — Progress Notes (Signed)
Chronic Care Management Pharmacy Assistant   Name: Kevin Bass  MRN: [id number] DOB: October 16, 1940  Reason for Encounter: Adherence Review  PCP : Colon Branch, MD  Verified Adherence Gap Information. Per insurance data, the patient is 100% compliant with their HTN (Losartan) medication and 90-99% compliant with their CHOL (Rosuvastatin) medication. Per insurance data patient has met their wellness bundle screening but has not met their annual wellness bundle screening. Their most recent A1C was 5.6 12/12/19 and their most recent blood pressure was 123/75 on 12/12/19.  Follow-Up:  Pharmacist Review   Charlann Lange, [place] Pharmacist Assistant [phone]

## 2020-04-21 NOTE — Telephone Encounter (Signed)
Please advise is it okay to send a script for 3 tabs every 8 hours. It look liks per note on 03/26/20 he could increase temporally to 3 times a day.

## 2020-04-29 ENCOUNTER — Other Ambulatory Visit: Payer: Self-pay | Admitting: Internal Medicine

## 2020-05-07 DIAGNOSIS — G500 Trigeminal neuralgia: Secondary | ICD-10-CM | POA: Diagnosis not present

## 2020-05-07 DIAGNOSIS — H5203 Hypermetropia, bilateral: Secondary | ICD-10-CM | POA: Diagnosis not present

## 2020-05-07 DIAGNOSIS — I69398 Other sequelae of cerebral infarction: Secondary | ICD-10-CM | POA: Diagnosis not present

## 2020-05-07 DIAGNOSIS — H52202 Unspecified astigmatism, left eye: Secondary | ICD-10-CM | POA: Diagnosis not present

## 2020-05-07 DIAGNOSIS — H2513 Age-related nuclear cataract, bilateral: Secondary | ICD-10-CM | POA: Diagnosis not present

## 2020-05-07 DIAGNOSIS — H43821 Vitreomacular adhesion, right eye: Secondary | ICD-10-CM | POA: Diagnosis not present

## 2020-05-07 DIAGNOSIS — H53461 Homonymous bilateral field defects, right side: Secondary | ICD-10-CM | POA: Diagnosis not present

## 2020-05-07 DIAGNOSIS — H43812 Vitreous degeneration, left eye: Secondary | ICD-10-CM | POA: Diagnosis not present

## 2020-05-07 DIAGNOSIS — H5711 Ocular pain, right eye: Secondary | ICD-10-CM | POA: Diagnosis not present

## 2020-05-07 DIAGNOSIS — H524 Presbyopia: Secondary | ICD-10-CM | POA: Diagnosis not present

## 2020-05-07 DIAGNOSIS — H35362 Drusen (degenerative) of macula, left eye: Secondary | ICD-10-CM | POA: Diagnosis not present

## 2020-05-10 ENCOUNTER — Other Ambulatory Visit: Payer: Self-pay | Admitting: Internal Medicine

## 2020-05-12 ENCOUNTER — Ambulatory Visit (INDEPENDENT_AMBULATORY_CARE_PROVIDER_SITE_OTHER): Payer: PPO

## 2020-05-12 DIAGNOSIS — I639 Cerebral infarction, unspecified: Secondary | ICD-10-CM | POA: Diagnosis not present

## 2020-05-12 LAB — CUP PACEART REMOTE DEVICE CHECK
Date Time Interrogation Session: 20220320003247
Implantable Pulse Generator Implant Date: 20181010

## 2020-05-12 MED ORDER — METOPROLOL SUCCINATE ER 25 MG PO TB24: 75.0000 mg | ORAL_TABLET | Freq: Every day | ORAL | 1 refills | Status: DC

## 2020-05-12 NOTE — Addendum Note (Signed)
Addended byDamita Dunnings D on: 05/12/2020 09:02 AM   Modules accepted: Orders

## 2020-05-20 NOTE — Progress Notes (Signed)
Carelink Summary Report / Loop Recorder 

## 2020-06-03 ENCOUNTER — Other Ambulatory Visit: Payer: Self-pay

## 2020-06-03 ENCOUNTER — Encounter: Payer: Self-pay | Admitting: Neurology

## 2020-06-03 ENCOUNTER — Ambulatory Visit: Payer: PPO | Admitting: Neurology

## 2020-06-03 VITALS — BP 123/72 | HR 63 | Ht 70.0 in | Wt 178.0 lb

## 2020-06-03 DIAGNOSIS — G629 Polyneuropathy, unspecified: Secondary | ICD-10-CM | POA: Diagnosis not present

## 2020-06-03 DIAGNOSIS — G500 Trigeminal neuralgia: Secondary | ICD-10-CM | POA: Diagnosis not present

## 2020-06-03 DIAGNOSIS — I639 Cerebral infarction, unspecified: Secondary | ICD-10-CM

## 2020-06-03 MED ORDER — GABAPENTIN 300 MG PO CAPS: 300.0000 mg | ORAL_CAPSULE | ORAL | 3 refills | Status: DC

## 2020-06-03 MED ORDER — BACLOFEN 5 MG PO TABS: ORAL_TABLET | ORAL | 6 refills | Status: DC

## 2020-06-03 NOTE — Progress Notes (Signed)
NEUROLOGY FOLLOW UP OFFICE NOTE  Kevin Bass [id number] April 30, 1940  HISTORY OF PRESENT ILLNESS: I had the pleasure of seeing Kevin Bass in follow-up in the neurology clinic on 06/03/2020.  The patient was last seen 7 months ago for trigeminal neuralgia and recurrent strokes. He contacted our office in February about another flare of his trigeminal neuralgia, Baclofen dose increased to 48m TID. He is also on Gabapentin 300 mg in AM, 9012min PM. Currently pain in 0/10, when he gets up in the morning there is a little pain in his eye that goes away. He feels his eye drops help as well. No drowsiness on current medications. He is on Eliquis, BP today 123/72. Memory is unchanged, he denies missing medications and uses his GPS when driving. Neuropathy stable.   History on Initial Assessment 10/01/2016: This is a pleasant [age] RH man with a history of colon cancer, hyperlipidemia, previous mitral valve repair in his usual state of health until 09/11/16 after taking a walk, he saw his brother-in-law and when he started noticing word-finding difficulties. His wife reported that his speech was not necessarily slurred but he was having trouble finding his words. When symptoms were unchanged the next day, he decided to go to the ER. He was still noted to have mild word-finding difficulties. There was no focal numbness/tingling/weakness. He denied any headaches, dizziness, diplopia, dysarthria/dysphagia. I personally reviewed MRI brain without contrast which showed an acute infarct in the left posterior insula and parietal operculum. MRA head and carotid dopplers did not show any significant stenosis. Echocardiogram showed EF 60-65%, mild LVH, prior MV repair without significant stenosis or regurgitation, normal LA size. He was switched from aspirin to Plavix. LDL was 124, HbA1c was 5.6. He was unable to get a TEE in the hospital, and has seen Dr. HiDebara Pickettnd agreed to having the procedure done on 8/14  followed by loop recorder. He denies any palpitations, chest pain, shortness of breath. No neck/back pain, bowel/bladder dysfunction. No falls. He denies any side effect on Plavix. He was started on Crestor yesterday. He feels he is 95% or more back to baseline, he denies any confusion or focal symptoms. He reports a history of facial pain on the right upper lip radiating up his cheek with good response to gabapentin. He takes 3004mhs with no side effects.  Update 12/07/16: He had no residual deficits from prior stroke, then on 11/29/16 he had sudden onset vertigo that made him fall backward. This lasted for 15 seconds and was followed by a mild headache. He called triage and was instructed to go seek medical attention the next day. He went to the ER and was found to have a right PICA territory stroke. He was evaluated by Neurology with no focal deficits seen. He reports he was taking aspirin and Plavix when the stroke occurred. He had an extensive workup for the prior stroke, he had a TEE in August 2018 which showed normal left atrium, no thrombus seen. He had a 48-hour monitor which was unremarkable. In the hospital, he had a CTA of the head and neck which did not show any occlusion or stenosis. Repeat TTE showed EF of 55-60%, left atrium mildly dilated. He had a CT chest/abdomen/pelvis which did not show any evidence of malignancy. On hospital discharge, he was instructed to continue aspirin and Plavix and stop his antihypertensives. He had a loop recorder placed.  Update 12/06/2017: He underwent right median nerve and ulnar nerve decompression on 11/28/17. He  had stopped the Eliquis for 2 days for the procedure, and restarted Eliquis on 11/29/17. On 12/02/17, he took a nap and woke up with right arm weakness and numbness. He could not lift his right arm up. After 20 minutes, he started getting more feeling in his arm. Per records, he stood up and noticed his gait was off. The weakness improved after several  minutes, but he continued to feel off balance. He went to the ER where SBP was elevated at 170. NIH 0 in the hospital. Records were reviewed, he had an MRI brain without contrast showing an acute infarct in the left hippocampus, occlusion of the left PCA which could be acute. There were chronic infarcts in the right cerebellum and left parietal operculum. He had an echo which showed an EF of 55-60%, left atrium normal, right atrium moderately dilated. No significant stenosis on carotid dopplers. LDL in August 2019 was 120, he was advised to increase Crestor to 63m daily. He was having muscle pains and stopped statin for 5 months, he was restarted Crestor with Zetia last August.  He was discharged home 2 days ago and reports that this is the worst of his strokes. It has affected his memory more than anything. He has also noticed vision changes on the right visual field, he describes blurred/double vision reading on his right side. He feels his strength is back but his balance is still off. He also has left foot pain. He has been dealing with his wife and sister's recent deaths.   Diagnostic Data: Neuropathy labs were normal (TSH, B12, ESR, folate, SPEP/IFE). He had an EMG/NCV of the right UE and LE which showed right ulnar neuropathy with slowing across the elbow, axon loss and demyelinating in type, mild to moderate in degree; right median neuropathy at or distal to the wrist, consistent with carpal tunnel, mild in degree, and probable L5 radiculopathy, mild. No evidence of a large fiber sensorimotor polyneuropathy.    PAST MEDICAL HISTORY: Past Medical History:  Diagnosis Date  . Allergy   . Anxiety   . Arthritis    hands  . Atrial fibrillation ([place]   . Colon cancer ([facility]    s/p partial colectomy 1990, Cscope neg 7-09, next 2014  . DISORDER, MITRAL VALVE    [place] recont[place] [facility]----still needs ABX for SBE prophylaxis   . Elevated PSA    prostate nodule, Dr BAlinda Money . GERD  (gastroesophageal reflux disease)   . Heart murmur    no problems since surgery  . HYPERGLYCEMIA, BORDERLINE 10/16/2008  . HYPERLIPIDEMIA 06/15/2006   diet controlled  . Hypertension   . HYPERTROPHY PROSTATE W/O UR OBST & OTH LUTS 10/16/2008  . Neuropathy   . Right groin pain    Dr. WRedmond Pulling . SCC (squamous cell carcinoma) 06/2017   s/p Mohs---Dr [place]ana Na . [place]   . Wears partial dentures    lower partial    MEDICATIONS: Current Outpatient Medications on File Prior to Visit  Medication Sig Dispense Refill  . acetaminophen (TYLENOL) 650 MG CR tablet Take 1,300 mg by mouth every 8 (eight) hours as needed for pain.    .Marland KitchenALPRAZolam (XANAX) 0.25 MG tablet Take 1 tablet (0.25 mg total) by mouth 2 (two) times daily as needed for anxiety. 60 tablet 0  . amLODipine (NORVASC) 5 MG tablet Take 1 tablet (5 mg total) by mouth daily. 90 tablet 1  . Arginine 500 MG CAPS Take 1,000 mg by mouth daily.     .Marland Kitchen  Baclofen 5 MG TABS 1 tablet three times a day as needed for pain 90 tablet 4  . ELIQUIS 5 MG TABS tablet TAKE 1 TABLET BY MOUTH TWICE DAILY 180 tablet 1  . gabapentin (NEURONTIN) 300 MG capsule Take 1 capsule (300 mg total) by mouth See admin instructions. Take 300 mg by mouth four times a day- morning, 8:30 PM, 9:30 PM, and 10:00 PM 360 capsule 3  . Glucosamine-MSM-Hyaluronic Acd (JOINT HEALTH PO) Take 1 capsule by mouth daily.     Marland Kitchen ketorolac (ACULAR) 0.5 % ophthalmic solution SMARTSIG:1 Drop(s) Right Eye 4 Times Daily PRN    . loratadine (CLARITIN) 10 MG tablet Take 10 mg by mouth at bedtime.    Marland Kitchen losartan (COZAAR) 100 MG tablet Take 1 tablet (100 mg total) by mouth daily. 90 tablet 0  . metoprolol succinate (TOPROL-XL) 25 MG 24 hr tablet Take 3 tablets (75 mg total) by mouth daily. 270 tablet 1  . Multiple Vitamins-Minerals (OCUVITE PRESERVISION PO) Take 1 capsule by mouth 2 (two) times daily.     . NON FORMULARY Take 1-2 packets by mouth See admin instructions. Lypo-Spheric vitamin C gel  packets- Mix 1-2 packets of gel into juice one to two times a day and drink    . NON FORMULARY Take 10 mg by mouth See admin instructions. [employer]arrow Formulas Lyco-Sorb (for Prostate & Cardiovascular Health) 10 mg capsules: Take 10 mg by mouth two times a day    . Omega-3 Fatty Acids (FISH OIL PO) Take 1 capsule by mouth in the morning and at bedtime.    Marland Kitchen omeprazole (PRILOSEC) 40 MG capsule Take 1 capsule (40 mg total) by mouth daily. 90 capsule 3  . prednisoLONE acetate (PRED FORTE) 1 % ophthalmic suspension Place 1 drop into the right eye at bedtime.    . rosuvastatin (CRESTOR) 40 MG tablet Take 1 tablet (40 mg total) by mouth daily. 90 tablet 1   No current facility-administered medications on file prior to visit.    ALLERGIES: Allergies  Allergen Reactions  . Statins Other (See Comments)    Myalgias (intolerance)    FAMILY HISTORY: Family History  Problem Relation Age of Onset  . Colon cancer Other        M side (cousins)  . Diabetes Neg Hx   . Stroke Neg Hx   . CAD Neg Hx   . Prostate cancer Neg Hx   . Rectal cancer Neg Hx   . Esophageal cancer Neg Hx     SOCIAL HISTORY: Social History   Socioeconomic History  . Marital status: Widowed    Spouse name: suzanne  . Number of children: 2  . Years of education: college  . Highest education level: Not on file  Occupational History  . Occupation: retired   Tobacco Use  . Smoking status: Former Smoker    Packs/day: 0.50    Years: 20.00    Pack years: 10.00    Types: Cigarettes    Quit date: 02/23/1983    Years since quitting: 37.3  . Smokeless tobacco: Never Used  Vaping Use  . Vaping Use: Never used  Substance and Sexual Activity  . Alcohol use: Not Currently    Alcohol/week: 7.0 standard drinks    Types: 7 Glasses of wine per week  . Drug use: No  . Sexual activity: Not Currently  Other Topics Concern  . Not on file  Social History Narrative   From [place], lost wife 08/2017 to cancer   Lives  by himself     Daughter in [place], son in [place]   Right handed      Social Determinants of Health   Financial Resource Strain: Low Risk   . Difficulty of Paying Living Expenses: Not very hard  Food Insecurity: Not on file  Transportation Needs: Not on file  Physical Activity: Not on file  Stress: Not on file  Social Connections: Not on file  Intimate Partner Violence: Not on file     PHYSICAL EXAM: Vitals:   06/03/20 1517  BP: 123/72  Pulse: 63  SpO2: 98%   General: No acute distress Head:  Normocephalic/atraumatic Skin/Extremities: No rash, no edema Neurological Exam: alert and awake. No aphasia or dysarthria. Fund of knowledge is appropriate. Attention and concentration are normal.   Cranial nerves: Pupils equal, round. Extraocular movements intact with no nystagmus. Visual fields full. Facial sensation intact. No facial asymmetry.  Motor: Bulk and tone normal, muscle strength 5/5 throughout with no pronator drift.   Finger to nose testing intact.  Gait narrow-based and steady, able to tandem walk adequately.  Romberg negative.   IMPRESSION: This is a pleasant 80 yo RH man with a history of colon cancer, hyperlipidemia, previous mitral valve repair with recurrent strokes (x3) secondary to atrial fibrillation, neuropathy, trigeminal neuralgia. He has had no residual deficits from prior stroke, possibly mild cognitive changes. Neuropathy stable. Main concern has been trigeminal neuralgia, currently controlled on his regimen of gabapentin 370m in AM, 9017min PM and Baclofen 19m24mID, refills sent. Continue Eliquis and control of vascular risk factors for secondary stroke prevention. Follow-up in 6 months, call for any changes.   Thank you for allowing me to participate in his care.  Please do not hesitate to call for any questions or concerns.   KarEllouise Newer.D.   CC: Dr. PazLarose Kells

## 2020-06-03 NOTE — Patient Instructions (Signed)
Good to see you! Continue all your medications. Follow-up in 6 months, call for any changes.

## 2020-06-11 ENCOUNTER — Other Ambulatory Visit: Payer: Self-pay

## 2020-06-11 ENCOUNTER — Encounter: Payer: Self-pay | Admitting: Internal Medicine

## 2020-06-11 ENCOUNTER — Ambulatory Visit (INDEPENDENT_AMBULATORY_CARE_PROVIDER_SITE_OTHER): Payer: PPO | Admitting: Internal Medicine

## 2020-06-11 ENCOUNTER — Ambulatory Visit: Payer: PPO | Admitting: Internal Medicine

## 2020-06-11 VITALS — BP 126/70 | HR 56 | Temp 97.9°F | Resp 18 | Ht 70.0 in | Wt 178.1 lb

## 2020-06-11 DIAGNOSIS — Z23 Encounter for immunization: Secondary | ICD-10-CM

## 2020-06-11 DIAGNOSIS — I4891 Unspecified atrial fibrillation: Secondary | ICD-10-CM

## 2020-06-11 DIAGNOSIS — I10 Essential (primary) hypertension: Secondary | ICD-10-CM

## 2020-06-11 NOTE — Patient Instructions (Signed)
Check the  blood pressure regularly BP GOAL is between 110/65 and  135/85. If it is consistently higher or lower, let me know   GO TO THE LAB : Get the blood work     [place], [place] back for a physical exam by October 2022

## 2020-06-11 NOTE — Progress Notes (Signed)
Subjective:    Patient ID: Kevin Bass, male    DOB: 12/16/1940, 80 y.o.   MRN: [id number]  DOS:  06/11/2020 Type of visit - description: Follow-up  Since the last office visit, he is doing well. He saw neurology 06/03/2020, note reviewed. Medication list reviewed, good compliance. Ambulatory BPs when check normal. Vaccinations reviewed.  Review of Systems See above   Past Medical History:  Diagnosis Date  . Allergy   . Anxiety   . Arthritis    hands  . Atrial fibrillation ([place])   . Colon cancer [name])    s/p partial colectomy 1990, Cscope neg 7-09, next 2014  . DISORDER, MITRAL VALVE    MV recontruction at [place] 2006----still needs ABX for SBE prophylaxis   . Elevated PSA    prostate nodule, [name]  . GERD (gastroesophageal reflux disease)   . Heart murmur    no problems since surgery  . HYPERGLYCEMIA, BORDERLINE 10/16/2008  . HYPERLIPIDEMIA 06/15/2006   diet controlled  . Hypertension   . HYPERTROPHY PROSTATE W/O UR OBST & OTH LUTS 10/16/2008  . Neuropathy   . Right groin pain    Dr. Redmond Pulling  . SCC (squamous cell carcinoma) 06/2017   s/p Mohs---[name]  . Stroke ([place])   . Wears partial dentures    lower partial    Past Surgical History:  Procedure Laterality Date  . COLECTOMY  1990  . HERNIA REPAIR Left 2014   [facility] rep w/mesh- March 2014  . LOOP RECORDER INSERTION N/A 12/01/2016   Procedure: LOOP RECORDER INSERTION;  Surgeon: Evans Lance, MD;  Location: [place] CV LAB;  Service: Cardiovascular;  Laterality: N/A;  . MITRAL VALVE ANNULOPLASTY     [facility] 2006  . TEE WITHOUT CARDIOVERSION N/A 10/05/2016   Procedure: TRANSESOPHAGEAL ECHOCARDIOGRAM (TEE);  Surgeon: [name], MD;  Location: [facility] ENDOSCOPY;  Service: Cardiovascular;  Laterality: N/A;    Allergies as of 06/11/2020      Reactions   Statins Other (See Comments)   Myalgias (intolerance)      Medication List       Accurate as of June 11, 2020 11:59 PM. If you have any  questions, ask your nurse or doctor.        acetaminophen 650 MG CR tablet Commonly known as: TYLENOL Take 1,300 mg by mouth every 8 (eight) hours as needed for pain.   ALPRAZolam 0.25 MG tablet Commonly known as: XANAX Take 1 tablet (0.25 mg total) by mouth 2 (two) times daily as needed for anxiety.   amLODipine 5 MG tablet Commonly known as: NORVASC Take 1 tablet (5 mg total) by mouth daily.   Arginine 500 MG Caps Take 1,000 mg by mouth daily.   Baclofen 5 MG Tabs 1 tablet three times a day as needed for pain   Eliquis 5 MG Tabs tablet Generic drug: apixaban TAKE 1 TABLET BY MOUTH TWICE DAILY   FISH OIL PO Take 1 capsule by mouth in the morning and at bedtime.   gabapentin 300 MG capsule Commonly known as: NEURONTIN Take 1 capsule (300 mg total) by mouth See admin instructions. Take 300 mg by mouth four times a day- morning, 8:30 PM, 9:30 PM, and 10:00 PM   JOINT HEALTH PO Take 1 capsule by mouth daily.   ketorolac 0.5 % ophthalmic solution Commonly known as: ACULAR SMARTSIG:1 Drop(s) Right Eye 4 Times Daily PRN   loratadine 10 MG tablet Commonly known as: CLARITIN Take 10 mg by mouth  at bedtime.   losartan 100 MG tablet Commonly known as: COZAAR Take 1 tablet (100 mg total) by mouth daily.   metoprolol succinate 25 MG 24 hr tablet Commonly known as: TOPROL-XL Take 3 tablets (75 mg total) by mouth daily.   NON FORMULARY Take 1-2 packets by mouth See admin instructions. Lypo-Spheric vitamin C gel packets- Mix 1-2 packets of gel into juice one to two times a day and drink   NON FORMULARY Take 10 mg by mouth See admin instructions. [employer] Lyco-Sorb (for Prostate & Cardiovascular Health) 10 mg capsules: Take 10 mg by mouth two times a day   OCUVITE PRESERVISION PO Take 1 capsule by mouth 2 (two) times daily.   omeprazole 40 MG capsule Commonly known as: PRILOSEC Take 1 capsule (40 mg total) by mouth daily.   prednisoLONE acetate 1 % ophthalmic  suspension Commonly known as: PRED FORTE Place 1 drop into the right eye at bedtime.   rosuvastatin 40 MG tablet Commonly known as: CRESTOR Take 1 tablet (40 mg total) by mouth daily.          Objective:   Physical Exam BP 126/70 (BP Location: Left Arm, Patient Position: Sitting, Cuff Size: Small)   Pulse (!) 56   Temp 97.9 F (36.6 C) (Oral)   Resp 18   Ht 5\' 10"  (1.778 m)   Wt 178 lb 2 oz (80.8 kg)   SpO2 97%   BMI 25.56 kg/m  General:   Well developed, NAD, BMI noted. HEENT:  Normocephalic . Face symmetric, atraumatic Lungs:  CTA B Normal respiratory effort, no intercostal retractions, no accessory muscle use. Heart: RRR,  no murmur.  Lower extremities: no pretibial edema bilaterally  Skin: Not pale. Not jaundice Neurologic:  alert & oriented X3.  Speech normal, gait appropriate for age and unassisted Psych--  Cognition and judgment appear intact.  Cooperative with normal attention span and concentration.  Behavior appropriate. No anxious or depressed appearing.      Assessment    Assessment Prediabetes HTN Hyperlipidemia, statin intolerant (simva-pravachol: shoulder pain) Anxiety- rarely takes xanax Neuro  --Facial neuropathy dx 2015, no w/u, sx resurface x1 after temporary d/c of meds  --saw neuro again 02/2017, labs (-), had a NCS, Rx gaba -- strokes CV: --Mitral valve annuloplasty 2006-- needs SBE prophylaxis -- SOB 04-2016, seen at [facility], had a ECHO Nl fx MV, cath: no CAD --Cryptogenic stroke 09/12/2016, had slurred speech. 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 [name] Colon cancer partial colectomy 1990, colonoscopy 08-2007 negative, Cscope again 09-2012 normal, 5 years  SCC . MOHS 06/2017  PLAN: Prediabetes: Last  A1c very good. HTN: BP today is very good, no change, continue losartan, metoprolol.  Check BMP. Atrial fibrillation: Anticoagulated, doing well, last visit with cardiology a year ago, will heck a TSH and CBC.  His device is checked regularly. Trigeminal neuropathy: Saw neurology recently, no changes made. Preventive care: PNM 23 booster today, recommend to proceed with the fourth COVID vaccination. RTC CPX 11-2020  This visit occurred during the SARS-CoV-2 public health emergency.  Safety protocols were in place, including screening questions prior to the visit, additional usage of staff PPE, and extensive cleaning of exam room while observing appropriate contact time as indicated for disinfecting solutions.

## 2020-06-12 ENCOUNTER — Ambulatory Visit (INDEPENDENT_AMBULATORY_CARE_PROVIDER_SITE_OTHER): Payer: PPO

## 2020-06-12 DIAGNOSIS — G459 Transient cerebral ischemic attack, unspecified: Secondary | ICD-10-CM

## 2020-06-12 LAB — BASIC METABOLIC PANEL
BUN: 23 mg/dL (ref 6–23)
CO2: 32 mEq/L (ref 19–32)
Calcium: 9.4 mg/dL (ref 8.4–10.5)
Chloride: 103 mEq/L (ref 96–112)
Creatinine, Ser: 1.08 mg/dL (ref 0.40–1.50)
GFR: 64.22 mL/min (ref >60.00)
Glucose, Bld: 90 mg/dL (ref 70–99)
Potassium: 4.7 mEq/L (ref 3.5–5.1)
Sodium: 141 mEq/L (ref 135–145)

## 2020-06-12 LAB — CBC WITH DIFFERENTIAL/PLATELET
Basophils Absolute: 0 10*3/uL (ref 0.0–0.1)
Basophils Relative: 0.5 % (ref 0.0–3.0)
Eosinophils Absolute: 0.2 10*3/uL (ref 0.0–0.7)
Eosinophils Relative: 2.7 % (ref 0.0–5.0)
HCT: 40.9 % (ref 39.0–52.0)
Hemoglobin: 13.7 g/dL (ref 13.0–17.0)
Lymphocytes Relative: 30.8 % (ref 12.0–46.0)
Lymphs Abs: 1.9 10*3/uL (ref 0.7–4.0)
MCHC: 33.4 g/dL (ref 30.0–36.0)
MCV: 83.4 fl (ref 78.0–100.0)
Monocytes Absolute: 0.4 10*3/uL (ref 0.1–1.0)
Monocytes Relative: 6.4 % (ref 3.0–12.0)
Neutro Abs: 3.7 10*3/uL (ref 1.4–7.7)
Neutrophils Relative %: 59.6 % (ref 43.0–77.0)
Platelets: 180 10*3/uL (ref 150.0–400.0)
RBC: 4.91 Mil/uL (ref 4.22–5.81)
RDW: 13.8 % (ref 11.5–15.5)
WBC: 6.1 10*3/uL (ref 4.0–10.5)

## 2020-06-12 LAB — CUP PACEART REMOTE DEVICE CHECK
Date Time Interrogation Session: 20220420003722
Implantable Pulse Generator Implant Date: 20181010

## 2020-06-12 LAB — TSH: TSH: 2.19 u[IU]/mL (ref 0.35–4.50)

## 2020-06-12 NOTE — Assessment & Plan Note (Signed)
Prediabetes: Last A1c very good. HTN: BP today is very good, no change, continue losartan, metoprolol.  Check BMP. Atrial fibrillation: Anticoagulated, doing well, last visit with cardiology a year ago, will heck a TSH and CBC.  His device is checked regularly. Trigeminal neuropathy: Saw neurology recently, no changes made. Preventive care: PNM 23 booster today, recommend to proceed with the fourth COVID vaccination. RTC CPX 11-2020

## 2020-06-19 ENCOUNTER — Ambulatory Visit (INDEPENDENT_AMBULATORY_CARE_PROVIDER_SITE_OTHER): Payer: PPO | Admitting: Pharmacist

## 2020-06-19 DIAGNOSIS — I4891 Unspecified atrial fibrillation: Secondary | ICD-10-CM

## 2020-06-19 DIAGNOSIS — E785 Hyperlipidemia, unspecified: Secondary | ICD-10-CM | POA: Diagnosis not present

## 2020-06-19 DIAGNOSIS — I10 Essential (primary) hypertension: Secondary | ICD-10-CM | POA: Diagnosis not present

## 2020-06-19 DIAGNOSIS — R739 Hyperglycemia, unspecified: Secondary | ICD-10-CM

## 2020-06-19 DIAGNOSIS — G500 Trigeminal neuralgia: Secondary | ICD-10-CM

## 2020-06-20 ENCOUNTER — Telehealth: Payer: PPO

## 2020-06-20 NOTE — Patient Instructions (Signed)
Visit Information Kevin Bass,  It was a pleasure speaking with you today. Please feel free to contact me if you have any questions or concerns. Below is information regarding you health goals.  I sent you a text with information about CoEnzyme Q10 which might help with arm and foot pain. Dose if usually 100mg  to 200mg  once daily.  Keep up the good work!  [name], PharmD Clinical Pharmacist [facility] [address] [place] [facility] [phone]  PATIENT GOALS: Goals Addressed            This Visit's Progress   . Avoid medications that can increase bleeding risk while taking Eliquis   On track   . Chronic Care Management Pharmacy Care Plan       CARE PLAN ENTRY (see longitudinal plan of care for additional care plan information)  Current Barriers:  . Chronic Disease Management support, education, and care coordination needs related to  AFib, Pre-DM, HTN, HLD, Hx of Stroke, Anxiety/Depression, GERD, Allergic Rhinitis, Trigeminal Neuralgia   Hypertension BP Readings from Last 3 Encounters:  06/11/20 126/70  06/03/20 123/72  12/12/19 123/75   . Pharmacist Clinical Goal(s): o Over the next 180 days, patient will work with PharmD and providers to maintain BP goal <130/80 . Current regimen:   Amlodipine 5mg  daily  Losartan 75mg  daily   Metoprolol succinate 75mg  daily . Interventions: o Discussed blood pressure goal . Patient self care activities - Over the next 180 days, patient will: o Check blood pressure 2 to 3 times per week, document, and provide at future appointments o Ensure daily salt intake < 2300 mg/day  Hyperlipidemia Lab Results  Component Value Date/Time   LDLCALC 49 12/12/2019 01:56 PM   . Pharmacist Clinical Goal(s): o Over the next 180 days, patient will work with PharmD and providers to maintain LDL goal < 70 . Current regimen:   Rosuvastatin 40mg  daily (patient is taking 0.5 tablet = 20mg  daily due to recent pain in arm and foot suspected  to be related to rosuvastatin)  Omega 3 - twice daily . Interventions: o Discussed LDL goal  o Suggested patient try CoEnzyme Q10 - 100 to 200mg  to see if this helps relieve pain he is experiencing . Patient self care activities - Over the next 180 days, patient will: o Continue to take rosuvastatin 40mg  0.5 tablet = 20mg  daily o Add CoEnzyme Q10 - 100 to 200mg  to see if this helps relieve pain he is experiencing  Atrial Fibrillation:  . Pharmacist Clinical Goal(s): o Over the next 180 days, patient will work with PharmD and providers to prevent stroke . Current regimen:   Metoprolol succinate ER 25mg  - take 3 tablets = 75mg  daily   Eliquis 5mg  - take 1 tablet twice a day . Interventions: o Reviewed Eliquis dosing - no need for dose adjustment o Patient approaching Medicare coverage gap when cost of Eliquis will increase significantly. Discuss available patient assistance program. Mailed patient application to complete and return to office. Also discussed 3% out of pocket spend requirement.  . Patient self care activities - Over the next 180 days, patient will: o Continue to current regimen for atrial fibrillation and stroke prevention o Complete application for Eliquis patient assistance.   Medication management . Pharmacist Clinical Goal(s): o Over the next 180 days, patient will work with PharmD and providers to maintain optimal medication adherence . Current pharmacy: [facility] . Interventions o Comprehensive medication review performed. o Continue current medication management strategy . Patient self  care activities - Over the next 180 days, patient will: o Focus on medication adherence by filling and taking medications appropriately  o Take medications as prescribed o Report any questions or concerns to PharmD and/or provider(s) - especially if you feel you cannot continue to take rosuvastatin  Please see past updates related to this goal by clicking on the "Past Updates"  button in the selected goal         The patient verbalized understanding of instructions, educational materials, and care plan provided today and agreed to receive a mailed copy of patient instructions, educational materials, and care plan.   Telephone follow up appointment with care management team member scheduled for: 1 month - phone check in

## 2020-06-20 NOTE — Chronic Care Management (AMB) (Signed)
Chronic Care Management Pharmacy Note  06/20/2020 Name:  Kevin Bass MRN:  [id number] DOB:  Apr 26, 1940  Subjective: Kevin Bass is an 80 y.o. year old male who is a primary patient of [name], Alda Berthold, MD.  The CCM team was consulted for assistance with disease management and care coordination needs.    Engaged with patient by telephone for follow up visit in response to provider referral for pharmacy case management and/or care coordination services.   Consent to Services:  The patient was given information about Chronic Care Management services, agreed to services, and gave verbal consent prior to initiation of services.  Please see initial visit note for detailed documentation.   Patient Care Team: Colon Branch, MD as PCP - General Lovena Le Champ Mungo, MD as PCP - Cardiology (Cardiology) Raynelle Bring, MD as Consulting Physician (Urology) Valaria Good, MD as Referring Physician (Cardiology) Radionchenko, Isaias Cowman, MD as Consulting Physician (Ophthalmology) Cameron Sprang, MD as Consulting Physician (Neurology) [name], PharmD (Pharmacist)  Recent office visits: 06/11/2020 - PCP (Dr [name]) - Seen for f/u  Chronic conditions; no med changes  Recent consult visits: 06/03/2020 - Neuro (Dr [name]) - f/u trigeminal neuralgia and recurrent strokes. No medication changes noted - continue baclofen and gabapentin for trigeminal neuralgia and Eliquis for recurrent stroke.  05/12/2020 - Cardio - Remote device interrogation  [facility] - Stop Pred AC drops in right eye - can increase IOP;  Start ketorolac as needed QID OD for pain - safe to use long term. Continue lubricating drops on regular basis   Hospital visits: None in previous 6 months  Objective:  Lab Results  Component Value Date   CREATININE 1.08 06/11/2020   CREATININE 1.08 12/12/2019   CREATININE 1.07 08/30/2019    Lab Results  Component Value Date   HGBA1C 5.6  12/12/2019   Last diabetic Eye exam: No results found for: HMDIABEYEEXA  Last diabetic Foot exam: No results found for: HMDIABFOOTEX      Component Value Date/Time   CHOL 101 12/12/2019 1356   TRIG 61 12/12/2019 1356   HDL 38 (L) 12/12/2019 1356   CHOLHDL 2.7 12/12/2019 1356   VLDL 25.4 12/11/2018 1101   LDLCALC 49 12/12/2019 1356   LDLDIRECT 149.1 08/30/2012 1352    Hepatic Function Latest Ref Rng & Units 08/30/2019 08/09/2018 01/09/2018  Total Protein 6.5 - 8.1 g/dL 7.2 6.2 -  Albumin 3.5 - 5.0 g/dL 4.1 4.0 -  AST 15 - 41 U/L _0 ALT 0 - 44 U/L _1 Alk Phosphatase 38 - 126 U/L 47 51 -  Total Bilirubin 0.3 - 1.2 mg/dL 1.0 0.5 -    Lab Results  Component Value Date/Time   TSH 2.19 06/11/2020 01:51 PM   TSH 1.850 12/03/2017 06:26 AM   TSH 1.88 02/25/2017 03:57 PM    CBC Latest Ref Rng & Units 06/11/2020 12/12/2019 08/30/2019  WBC 4.0 - 10.5 K/uL 6.1 5.8 7.9  Hemoglobin 13.0 - 17.0 g/dL 13.7 13.2 14.1  Hematocrit 39.0 - 52.0 % 40.9 40.2 43.6  Platelets 150.0 - 400.0 K/uL 180.0 191 176    Lab Results  Component Value Date/Time   VD25OH 39 10/16/2008 08:55 PM    Clinical ASCVD: Yes  The ASCVD Risk score (Goff DC Jr., et al., 2013) failed to calculate for the following reasons:   The 2013 ASCVD risk score is only valid for ages 73 to 11   The patient  has a prior MI or stroke diagnosis    Other: C CHA2DS2/VAS Stroke Risk Points = 6 (CHF; history of stroke; age; HTN)    Social History   Tobacco Use  Smoking Status Former Smoker  . Packs/day: 0.50  . Years: 20.00  . Pack years: 10.00  . Types: Cigarettes  . Quit date: 02/23/1983  . Years since quitting: 37.3  Smokeless Tobacco Never Used   BP Readings from Last 3 Encounters:  06/11/20 126/70  06/03/20 123/72  12/12/19 123/75   Pulse Readings from Last 3 Encounters:  06/11/20 (!) 56  06/03/20 63  12/12/19 65   Wt Readings from Last 3 Encounters:  06/11/20 178 lb 2 oz (80.8 kg)  06/03/20 178 lb  (80.7 kg)  12/12/19 182 lb 2 oz (82.6 kg)    Assessment: Review of patient past medical history, allergies, medications, health status, including review of consultants reports, laboratory and other test data, was performed as part of comprehensive evaluation and provision of chronic care management services.   SDOH:  (Social Determinants of Health) assessments and interventions performed:  SDOH Interventions   Flowsheet Row Most Recent Value  SDOH Interventions   Financial Strain Interventions --  [sending application for Eliquis patient assistance]      CCM Care Plan  Allergies  Allergen Reactions  . Prednisolone Acetate     Increased intraocular pressure  . Statins Other (See Comments)    Myalgias (intolerance) Simvastatin, atorvastatin, pravastatin    Medications Reviewed Today    Reviewed by [name], PharmD (Pharmacist) on 06/19/20 at 1525  Med List Status: <None>  Medication Order Taking? Sig Documenting Provider Last Dose Status Informant  acetaminophen (TYLENOL) 650 MG CR tablet [id number] Yes Take 1,300 mg by mouth every 8 (eight) hours as needed for pain. [provider] Taking Active Multiple Informants  ALPRAZolam (XANAX) 0.25 MG tablet [id number] Yes Take 1 tablet (0.25 mg total) by mouth 2 (two) times daily as needed for anxiety. Colon Branch, MD Taking Active   amLODipine (NORVASC) 5 MG tablet [id number] Yes Take 1 tablet (5 mg total) by mouth daily. Colon Branch, MD Taking Active   Arginine 500 MG CAPS [id number] Yes Take 1,000 mg by mouth daily.  [provider] Taking Active Multiple Informants  Baclofen 5 MG TABS [id number] Yes 1 tablet three times a day as needed for pain Cameron Sprang, MD Taking Active   ELIQUIS 5 MG TABS tablet [id number] Yes TAKE 1 TABLET BY MOUTH TWICE DAILY Hilty, Nadean Corwin, MD Taking Active   gabapentin (NEURONTIN) 300 MG capsule [id number] Yes Take 1 capsule (300 mg total) by mouth See admin instructions. Take 300 mg by  mouth four times a day- morning, 8:30 PM, 9:30 PM, and 10:00 PM Cameron Sprang, MD Taking Active   Glucosamine-MSM-Hyaluronic Acd ([place]) [id number] Yes Take 1 capsule by mouth daily.  [provider] Taking Active Multiple Informants  ketorolac (ACULAR) 0.5 % ophthalmic solution [id number] Yes SMARTSIG:1 Drop(s) Right Eye 4 Times Daily PRN [provider] Taking Active   loratadine (CLARITIN) 10 MG tablet [id number] Yes Take 10 mg by mouth at bedtime. [provider] Taking Active Multiple Informants           Med Note Pilar Plate Sep 12, 2016 11:40 AM)    losartan (COZAAR) 100 MG tablet [id number] Yes Take 1 tablet (100 mg total) by mouth daily. Colon Branch, MD Taking Active   metoprolol succinate (  TOPROL-XL) 25 MG 24 hr tablet [id number] Yes Take 3 tablets (75 mg total) by mouth daily. Colon Branch, MD Taking Active   Multiple Vitamins-Minerals (OCUVITE PRESERVISION PO) [id number] Yes Take 1 capsule by mouth 2 (two) times daily.  [provider] Taking Active Multiple Informants  NON FORMULARY [id number] Yes Take 1-2 packets by mouth See admin instructions. Lypo-Spheric vitamin C gel packets- Mix 1-2 packets of gel into juice one to two times a day and drink [provider] Taking Active Multiple Informants  NON FORMULARY [id number] Yes Take 10 mg by mouth See admin instructions. [employer] Formulas Lyco-Sorb (for Prostate & Cardiovascular Health) 10 mg capsules: Take 10 mg by mouth two times a day [provider] Taking Active Multiple Informants  Omega-3 Fatty Acids (FISH OIL PO) [id number] Yes Take 1 capsule by mouth in the morning and at bedtime. [provider] Taking Active Multiple Informants  omeprazole (PRILOSEC) 40 MG capsule [id number] Yes Take 1 capsule (40 mg total) by mouth daily. Colon Branch, MD Taking Active   prednisoLONE acetate (PRED FORTE) 1 % ophthalmic suspension [id number] Yes Place 1 drop into the right eye  at bedtime. [provider] Taking Active Multiple Informants  rosuvastatin (CRESTOR) 40 MG tablet [id number] Yes Take 1 tablet (40 mg total) by mouth daily.  Patient taking differently: Take 20 mg by mouth daily.   Colon Branch, MD Taking Active   Vitamin D, Cholecalciferol, 25 MCG (1000 UT) TABS [id number] Yes Take 1 tablet by mouth daily. [provider] Taking Active           Patient Active Problem List   Diagnosis Date Noted  . Chronic rhinitis 03/29/2019  . TIA (transient ischemic attack) 12/03/2017  . Chronic anticoagulation 12/03/2017  . H/O: stroke 12/03/2017  . Paroxysmal atrial fibrillation ([place]) 07/11/2017  . SCC (squamous cell carcinoma) 06/22/2017  . Varicose veins of both lower extremities 06/19/2017  . Bilateral lower extremity edema 06/19/2017  . Trichiasis without entropion of right lower eyelid 04/14/2017  . Paresthesias 03/03/2017  . Trigeminal neuralgia of right side of face 10/01/2016  . History of mitral valve repair 09/30/2016  . Cerebrovascular accident (CVA) ([place]) 09/12/2016  . GERD (gastroesophageal reflux disease) 09/22/2015  . PCP NOTES >>>>> 11/08/2014  . Chest pain 06/07/2014  . Facial neuropathy 04/02/2013  . Anxiety and depression 02/26/2013  . Annual physical exam 08/30/2012  . HTN (hypertension) 01/22/2011  . BPH (benign prostatic hyperplasia) 10/16/2008  . Hyperglycemia 10/16/2008  . Hyperlipidemia 06/15/2006  . Mitral valve disease 06/15/2006  . Allergic rhinitis 06/15/2006  . COLON CANCER, HX OF 06/15/2006    Immunization History  Administered Date(s) Administered  . Fluad Quad(high Dose 65+) 11/13/2018  . Influenza Whole 03/10/2007, 11/17/2007  . Influenza-Unspecified 01/17/2017  . PFIZER(Purple Top)SARS-COV-2 Vaccination 04/17/2019, 05/08/2019, 01/02/2020  . Pneumococcal Conjugate-13 01/16/2014  . Pneumococcal Polysaccharide-23 01/12/2005, 04/21/2010, 06/11/2020  . Td 06/15/2006, 01/05/2015  . Tdap 12/13/2016  .  Zoster 03/25/1998  . Zoster Recombinat (Shingrix) 02/03/2017, 12/14/2018    Conditions to be addressed/monitored: Atrial Fibrillation, CAD, HTN, HLD, Anxiety and trigeminal neuralgia with ocular effects; BPH; pre DM; h/o recurrent stroke  Care Plan : General Pharmacy (Adult)  Updates made by [name], PHARMD since 06/20/2020 12:00 AM    Problem: Chronic Disease Management support, education, and care coordination needs related to  AFib, Pre-DM, HTN, HLD, Hx of Stroke, Anxiety/Depression, GERD, Allergic Rhinitis, Trigeminal Neuralgia   Priority: Medium  Onset Date: 06/19/2020  Note:  Current Barriers:  . Does not adhere to prescribed medication regimen due to suspected intolerance . Approaching Medicare coverage gap and needs assistance with cost of Eliquis . Chronic Disease Management support, education, and care coordination needs related to  AFib, Pre-DM, HTN, HLD, Hx of Stroke, Anxiety/Depression, GERD, Allergic Rhinitis, Trigeminal Neuralgia  Pharmacist Clinical Goal(s):  Marland Kitchen Over the next 180 days, patient will maintain control of HTN, hyperlipidemia as evidenced by attainment of goals listed below  . adhere to plan to optimize therapeutic regimen for hyperlipiemia as evidenced by report of adherence to recommended medication management changes . contact provider office for questions/concerns as evidenced notation of same in electronic health record through collaboration with PharmD and provider.   Interventions: . 1:1 collaboration with Colon Branch, MD regarding development and update of comprehensive plan of care as evidenced by provider attestation and co-signature . Inter-disciplinary care team collaboration (see longitudinal plan of care) . Comprehensive medication review performed; medication list updated in electronic medical record   Hypertension BP Readings from Last 3 Encounters:  06/11/20 126/70  06/03/20 123/72  12/12/19 123/75   . Currently at goal of BP  <130/80 . Current regimen:   Amlodipine 37m daily  Losartan 756mdaily   Metoprolol succinate 7514maily . Interventions: o Discussed blood pressure goal o Recommended heck blood pressure 2 to 3 times per week, document, and provide at future appointments o Ensure daily salt intake < 2300 mg/day  Hyperlipidemia goal < 70 (history of stroke) . Currently at goal but has recently decrease dose of rosuvastatin due to suspected intolerance . Patient reports having pain in arm and foot. He lowered dose of rosuvastatin from 27m99mily to 20mg14mly over the last 2 weeks and feels that pain is improving . Current regimen:   Rosuvastatin 27mg 28my (patient is taking 0.5 tablet = 20mg d61m due to recent pain in arm and foot suspected to be related to rosuvastatin)  Omega 3 - twice daily . Interventions: o Discussed LDL goal  o Suggested patient try CoEnzyme Q10 - 100 to 200mg to14m if this helps relieve pain he is experiencing o Continue to take rosuvastatin 27mg 0.579mlet = 20mg dail28md Omega 3 fatty acids o Consider rechecking LDL at next office visit. If LDL >70, consider adding back ezetimibe or trial of Nexletol or PSK9   Atrial Fibrillation:  . Current regimen:   Metoprolol succinate ER 25mg - tak26mtablets = 75mg daily 63miquis 5mg - take 161mblet twice a day . Patient denies and signs or symptoms of bleeding . Interventions: o Reviewed Eliquis dosing - no need for dose adjustment o Patient approaching Medicare coverage gap when cost of Eliquis will increase significantly. Discuss available patient assistance program. Mailed patient application to complete and return to office. Also discussed 3% out of pocket spend requirement.  o Continue to current regimen for atrial fibrillation and stroke prevention  Medication management . Current pharmacy: [facility] . Interventions o Comprehensive medication review performed. o Continue current medication management  strategy o Reviewed adherence through fill history. Noted that patient has not filled rosuvastatin in 2022 yet. Adherence addressed (see hyperlipidemia section above)  o Patient is reminded and encouraged to report any questions or concerns to PharmD and/or provider (especially if you feel you cannot continue to take rosuvastatin)  Patient Goals/Self-Care Activities . Over the next 180 days, patient will:  take medications as prescribed, check blood pressure 2 to 3 times per week ,  document, and provide at future appointments, and collaborate with provider on medication access solutions  Follow Up Plan: Telephone follow up appointment with care management team member scheduled for:  Pharmacy team will follow up PAP application and adherence in 1 month; Clinical pharmacist outreach in 3 months.       Medication Assistance: application for Eliquis PAP will be mailed to patient   Patient's preferred pharmacy is:  [facility] [place], [address] [place] [address] Phone: [phone] Fax: [phone]    Follow Up:  Patient agrees to Care Plan and Follow-up.  Plan: Telephone follow up appointment with care management team member scheduled for:  1 month from pharmacy CCM team; 3 months with clinical pharmacist  [name], PharmD Clinical Pharmacist [facility]

## 2020-07-01 NOTE — Progress Notes (Signed)
Carelink Summary Report / Loop Recorder 

## 2020-07-14 ENCOUNTER — Ambulatory Visit (INDEPENDENT_AMBULATORY_CARE_PROVIDER_SITE_OTHER): Payer: PPO

## 2020-07-14 DIAGNOSIS — G459 Transient cerebral ischemic attack, unspecified: Secondary | ICD-10-CM | POA: Diagnosis not present

## 2020-07-16 LAB — CUP PACEART REMOTE DEVICE CHECK
Date Time Interrogation Session: 20220521003758
Implantable Pulse Generator Implant Date: 20181010

## 2020-07-23 ENCOUNTER — Other Ambulatory Visit: Payer: Self-pay | Admitting: Internal Medicine

## 2020-07-25 ENCOUNTER — Other Ambulatory Visit: Payer: Self-pay | Admitting: Internal Medicine

## 2020-08-01 ENCOUNTER — Other Ambulatory Visit: Payer: Self-pay | Admitting: Internal Medicine

## 2020-08-01 NOTE — Telephone Encounter (Signed)
Prescription refill request for Eliquis received. Indication:atrial fib Last office visit:3/22 taylor Scr:1.0 Age: 80 Weight:80.8 kg  Prescription refilled

## 2020-08-04 NOTE — Progress Notes (Signed)
Carelink Summary Report / Loop Recorder 

## 2020-08-08 ENCOUNTER — Other Ambulatory Visit: Payer: Self-pay

## 2020-08-08 ENCOUNTER — Ambulatory Visit (INDEPENDENT_AMBULATORY_CARE_PROVIDER_SITE_OTHER): Payer: PPO | Admitting: Internal Medicine

## 2020-08-08 VITALS — BP 125/85 | HR 60 | Temp 98.2°F | Resp 16 | Ht 70.0 in | Wt 175.1 lb

## 2020-08-08 DIAGNOSIS — I10 Essential (primary) hypertension: Secondary | ICD-10-CM

## 2020-08-08 MED ORDER — LOSARTAN POTASSIUM 100 MG PO TABS: 50.0000 mg | ORAL_TABLET | Freq: Every day | ORAL | Status: DC

## 2020-08-08 MED ORDER — METOPROLOL SUCCINATE ER 25 MG PO TB24: 62.5000 mg | ORAL_TABLET | Freq: Every day | ORAL | Status: DC

## 2020-08-08 MED ORDER — METOPROLOL SUCCINATE ER 25 MG PO TB24: 50.0000 mg | ORAL_TABLET | Freq: Every day | ORAL | Status: DC

## 2020-08-08 NOTE — Patient Instructions (Signed)
Take medications as recommended  Continue checking your blood pressure BP GOAL is between 110/65 and  135/85. If it is consistently higher or lower, let me know

## 2020-08-08 NOTE — Progress Notes (Signed)
Subjective:    Patient ID: Kevin Bass, male    DOB: [birth year], 80 y.o.   MRN: [id number]  DOS:  08/08/2020 Type of visit - description: acute   Last week, he started to feel somewhat tired in the afternoon , he noted his blood pressure was in the low side, around 100/55. 2 days ago it was as low as 80/45. He decrease losartan to half tablet daily and metoprolol from 3 tablets daily to 2.5 tablets daily.  This afternoon he feels better, back to baseline   Review of Systems Denies fever chills No chest pain no difficulty breathing No nausea, vomiting, diarrhea.  No blood in the stools. He has been avoiding walking outdoors due to high temperatures this days.  Past Medical History:  Diagnosis Date   Allergy    Anxiety    Arthritis    hands   Atrial fibrillation ([place])    Colon cancer ([place])    s/p partial colectomy 1990, Cscope neg 7-09, next 2014   DISORDER, MITRAL VALVE    MV recontruction at [place] 2006----still needs ABX for SBE prophylaxis    Elevated PSA    prostate nodule, [name]   GERD (gastroesophageal reflux disease)    Heart murmur    no problems since surgery   HYPERGLYCEMIA, BORDERLINE 10/16/2008   HYPERLIPIDEMIA 06/15/2006   diet controlled   Hypertension    HYPERTROPHY PROSTATE W/O UR OBST & OTH LUTS 10/16/2008   Neuropathy    Right groin pain    Dr. Redmond Pulling   SCC (squamous cell carcinoma) 06/2017   s/p Mohs---[name]   Stroke [facility])    Wears partial dentures    lower partial    Past Surgical History:  Procedure Laterality Date   COLECTOMY  1990   HERNIA REPAIR Left 2014   [facility] rep w/mesh- March 2014   LOOP RECORDER INSERTION N/A 12/01/2016   Procedure: LOOP RECORDER INSERTION;  Surgeon: Evans Lance, MD;  Location: [facility];  Service: Cardiovascular;  Laterality: N/A;   MITRAL VALVE ANNULOPLASTY     [facility] 2006   TEE WITHOUT CARDIOVERSION N/A 10/05/2016   Procedure: TRANSESOPHAGEAL ECHOCARDIOGRAM (TEE);  Surgeon: [name], MD;  Location: [facility] ENDOSCOPY;  Service: Cardiovascular;  Laterality: N/A;    Allergies as of 08/08/2020       Reactions   Prednisolone Acetate    Increased intraocular pressure   Statins Other (See Comments)   Myalgias (intolerance) Simvastatin, atorvastatin, pravastatin        Medication List        Accurate as of August 08, 2020 11:59 PM. If you have any questions, ask your nurse or doctor.          acetaminophen 650 MG CR tablet Commonly known as: TYLENOL Take 1,300 mg by mouth every 8 (eight) hours as needed for pain.   ALPRAZolam 0.25 MG tablet Commonly known as: XANAX Take 1 tablet (0.25 mg total) by mouth 2 (two) times daily as needed for anxiety.   amLODipine 5 MG tablet Commonly known as: NORVASC Take 1 tablet (5 mg total) by mouth daily.   Arginine 500 MG Caps Take 1,000 mg by mouth daily.   Baclofen 5 MG Tabs 1 tablet three times a day as needed for pain   Eliquis 5 MG Tabs tablet Generic drug: apixaban TAKE 1 TABLET BY MOUTH TWICE DAILY   FISH OIL PO Take 1 capsule by mouth in the morning and at bedtime.  gabapentin 300 MG capsule Commonly known as: NEURONTIN Take 1 capsule (300 mg total) by mouth See admin instructions. Take 300 mg by mouth four times a day- morning, 8:30 PM, 9:30 PM, and 10:00 PM   JOINT HEALTH PO Take 1 capsule by mouth daily.   ketorolac 0.5 % ophthalmic solution Commonly known as: ACULAR SMARTSIG:1 Drop(s) Right Eye 4 Times Daily PRN   loratadine 10 MG tablet Commonly known as: CLARITIN Take 10 mg by mouth at bedtime.   losartan 100 MG tablet Commonly known as: COZAAR Take 0.5 tablets (50 mg total) by mouth daily.   metoprolol succinate 25 MG 24 hr tablet Commonly known as: TOPROL-XL Take 2.5 tablets (62.5 mg total) by mouth daily.   NON FORMULARY Take 1-2 packets by mouth See admin instructions. Lypo-Spheric vitamin C gel packets- Mix 1-2 packets of gel into juice one to two times a day and drink   NON  FORMULARY Take 10 mg by mouth See admin instructions. [employer] Formulas Lyco-Sorb (for Prostate & Cardiovascular Health) 10 mg capsules: Take 10 mg by mouth two times a day   OCUVITE PRESERVISION PO Take 1 capsule by mouth 2 (two) times daily.   omeprazole 40 MG capsule Commonly known as: PRILOSEC Take 1 capsule (40 mg total) by mouth daily.   rosuvastatin 40 MG tablet Commonly known as: CRESTOR Take 1 tablet (40 mg total) by mouth daily. What changed: how much to take   Vitamin D (Cholecalciferol) 25 MCG (1000 UT) Tabs Take 1 tablet by mouth daily.           Objective:   Physical Exam BP 125/85 (BP Location: Right Arm, Patient Position: Sitting, Cuff Size: Small)   Pulse 60   Temp 98.2 F (36.8 C) (Oral)   Resp 16   Ht 5\' 10"  (1.778 m)   Wt 175 lb 2 oz (79.4 kg)   SpO2 98%   BMI 25.13 kg/m  General:   Well developed, NAD, BMI noted. HEENT:  Normocephalic . Face symmetric, atraumatic.  No pain Lungs:  CTA B Normal respiratory effort, no intercostal retractions, no accessory muscle use. Heart: RRR,  no murmur.  Lower extremities: no pretibial edema bilaterally  Skin: Not pale. Not jaundice Neurologic:  alert & oriented X3.  Speech normal, gait appropriate for age and unassisted Psych--  Cognition and judgment appear intact.  Cooperative with normal attention span and concentration.  Behavior appropriate. No anxious or depressed appearing.      Assessment     Assessment Prediabetes HTN Hyperlipidemia, statin intolerant (simva-pravachol: shoulder pain) Anxiety- rarely takes xanax Neuro  --Facial neuropathy dx 2015, no w/u, sx resurface x1 after temporary d/c of meds  --saw neuro again 02/2017, labs (-), had a NCS, Rx gaba -- strokes CV: --Mitral valve annuloplasty 2006-- needs SBE prophylaxis -- SOB 04-2016, seen at [facility], had a ECHO Nl fx MV, cath: no CAD --Cryptogenic stroke 09/12/2016, had slurred speech. Was recommended a loop recorder.started  plavix -- new R PICA stroke, admitted 11-30-16, implanted loop recorder placed --A-FIB noted on ILR ~ 12-21-2016, changed to eliquis  -- 11/2017: Acute L Hippocampus CVA with acute L posterior cerabral artery Elevated PSA and BPH-- Dr. [name] Colon cancer partial colectomy 1990, colonoscopy 08-2007 negative, Cscope again 09-2012 normal, 5 years  SCC . MOHS 06/2017  PLAN: HTN: BP has been low in the last week,  do not suspect any acute problems such as GI bleed or dehydration. He self decrease his medications and this afternoon BP  is okay.  When he arrived BP was 144/86, I rechecked manually: 125/85.  He feels well. Plan: Continue amlodipine 5 mg Self decrease losartan 100 mg to half tablet daily, continue with that dose Self decrease metoprolol 25 mg from 3 tablets daily to 2.5 tablets daily and that is okay. Continue monitoring blood pressures Call if problems  This visit occurred during the SARS-CoV-2 public health emergency.  Safety protocols were in place, including screening questions prior to the visit, additional usage of staff PPE, and extensive cleaning of exam room while observing appropriate contact time as indicated for disinfecting solutions.

## 2020-08-09 NOTE — Assessment & Plan Note (Signed)
HTN: BP has been low in the last week,  do not suspect any acute problems such as GI bleed or dehydration. He self decrease his medications and this afternoon BP is okay.  When he arrived BP was 144/86, I rechecked manually: 125/85.  He feels well. Plan: Continue amlodipine 5 mg Self decrease losartan 100 mg to half tablet daily, continue with that dose Self decrease metoprolol 25 mg from 3 tablets daily to 2.5 tablets daily and that is okay. Continue monitoring blood pressures Call if problems

## 2020-08-11 ENCOUNTER — Encounter: Payer: Self-pay | Admitting: Internal Medicine

## 2020-08-14 ENCOUNTER — Ambulatory Visit (INDEPENDENT_AMBULATORY_CARE_PROVIDER_SITE_OTHER): Payer: PPO

## 2020-08-14 ENCOUNTER — Telehealth: Payer: Self-pay | Admitting: Emergency Medicine

## 2020-08-14 DIAGNOSIS — G459 Transient cerebral ischemic attack, unspecified: Secondary | ICD-10-CM | POA: Diagnosis not present

## 2020-08-14 LAB — CUP PACEART REMOTE DEVICE CHECK
Date Time Interrogation Session: 20220621004414
Implantable Pulse Generator Implant Date: 20181010

## 2020-08-14 NOTE — Telephone Encounter (Signed)
Carelink Alert received for tachy episode. Attempted to assess patient for tachy episode. No answer. No option to leave voicemail.

## 2020-08-15 ENCOUNTER — Other Ambulatory Visit: Payer: Self-pay | Admitting: Internal Medicine

## 2020-08-20 NOTE — Telephone Encounter (Signed)
Follow up phone call.  Patient reports he was asymptomatic. Reports compliance with medications on file. Advised I will forward to Dr. Lovena Le and we will call with any changes. Patient appreciative of call.

## 2020-09-03 NOTE — Progress Notes (Signed)
Carelink Summary Report / Loop Recorder 

## 2020-09-09 ENCOUNTER — Telehealth: Payer: Self-pay

## 2020-09-09 NOTE — Telephone Encounter (Signed)
ILR has reached RRT 09/05/20. Patient called and notified.   Discussed options of explant vs. Leave in. Patient reports he would like to leave device in. Return kit to be sent to his house. Patient advised if he has further questions or concerns to please contact the device clinic.

## 2020-09-12 ENCOUNTER — Ambulatory Visit (INDEPENDENT_AMBULATORY_CARE_PROVIDER_SITE_OTHER): Payer: PPO | Admitting: Pharmacist

## 2020-09-12 DIAGNOSIS — I10 Essential (primary) hypertension: Secondary | ICD-10-CM

## 2020-09-12 DIAGNOSIS — I4891 Unspecified atrial fibrillation: Secondary | ICD-10-CM | POA: Diagnosis not present

## 2020-09-12 DIAGNOSIS — E785 Hyperlipidemia, unspecified: Secondary | ICD-10-CM

## 2020-09-13 NOTE — Chronic Care Management (AMB) (Signed)
Chronic Care Management Pharmacy Note  09/13/2020 Name:  Kevin Bass MRN:  [id number] DOB:  [birth year]  Subjective: Kevin Bass is an 80 y.o. year old male who is a primary patient of [name], Alda Berthold, MD.  The CCM team was consulted for assistance with disease management and care coordination needs.    Engaged with patient by telephone for follow up visit in response to provider referral for pharmacy case management and/or care coordination services.   Consent to Services:  The patient was given information about Chronic Care Management services, agreed to services, and gave verbal consent prior to initiation of services.  Please see initial visit note for detailed documentation.   Patient Care Team: Colon Branch, MD as PCP - General Lovena Le Champ Mungo, MD as PCP - Cardiology (Cardiology) Raynelle Bring, MD as Consulting Physician (Urology) Valaria Good, MD as Referring Physician (Cardiology) Antionette Fairy Isaias Cowman, MD as Consulting Physician (Ophthalmology) Cameron Sprang, MD as Consulting Physician (Neurology) [name], PharmD (Pharmacist)  Recent office visits: 08/11/2020 - My Chart message - low BP readings. Dr [name] recommended holding amlodipine.  08/08/2020 - PCP (Dr [name]) - decreased losartan to 73m or 1/2 of 1032mtablet; Decreased metoprolol from 3 tabs to 2.5tabs = 62.57m38maily.   Recent consult visits: 06/03/2020 - Neuro (Dr AquDelice Lesch f/u trigeminal neuralgia and recurrent strokes. No medication changes noted - continue baclofen and gabapentin for trigeminal neuralgia and Eliquis for recurrent stroke.   05/12/2020 - Cardio - Remote device interrogation   05/07/2020 - [facility] Stop Pred AC drops in right eye - can increase IOP;  Start ketorolac as needed QID OD for pain - safe to use long term. Continue lubricating drops on regular basis  Hospital visits: None in previous 6 months  Objective:  Lab Results  Component  Value Date   CREATININE 1.08 06/11/2020   CREATININE 1.08 12/12/2019   CREATININE 1.07 08/30/2019    Lab Results  Component Value Date   HGBA1C 5.6 12/12/2019   Last diabetic Eye exam: No results found for: HMDIABEYEEXA  Last diabetic Foot exam: No results found for: HMDIABFOOTEX      Component Value Date/Time   CHOL 101 12/12/2019 1356   TRIG 61 12/12/2019 1356   HDL 38 (L) 12/12/2019 1356   CHOLHDL 2.7 12/12/2019 1356   VLDL 25.4 12/11/2018 1101   LDLCALC 49 12/12/2019 1356   LDLDIRECT 149.1 08/30/2012 1352    Hepatic Function Latest Ref Rng & Units 08/30/2019 08/09/2018 01/09/2018  Total Protein 6.5 - 8.1 g/dL 7.2 6.2 -  Albumin 3.5 - 5.0 g/dL 4.1 4.0 -  AST 15 - 41 U/L '21 17 17  ' ALT 0 - 44 U/L '17 17 17  ' Alk Phosphatase 38 - 126 U/L 47 51 -  Total Bilirubin 0.3 - 1.2 mg/dL 1.0 0.5 -    Lab Results  Component Value Date/Time   TSH 2.19 06/11/2020 01:51 PM   TSH 1.850 12/03/2017 06:26 AM   TSH 1.88 02/25/2017 03:57 PM    CBC Latest Ref Rng & Units 06/11/2020 12/12/2019 08/30/2019  WBC 4.0 - 10.5 K/uL 6.1 5.8 7.9  Hemoglobin 13.0 - 17.0 g/dL 13.7 13.2 14.1  Hematocrit 39.0 - 52.0 % 40.9 40.2 43.6  Platelets 150.0 - 400.0 K/uL 180.0 191 176    Lab Results  Component Value Date/Time   VD25OH 39 10/16/2008 08:55 PM    Clinical ASCVD: Yes  The ASCVD Risk score (GoMikey Bussing Jr.Brooke Bonitoet al.,  2013) failed to calculate for the following reasons:   The 2013 ASCVD risk score is only valid for ages 34 to 76   The patient has a prior MI or stroke diagnosis     Social History   Tobacco Use  Smoking Status Former   Packs/day: 0.50   Years: 20.00   Pack years: 10.00   Types: Cigarettes   Quit date: 02/23/1983   Years since quitting: 37.5  Smokeless Tobacco Never   BP Readings from Last 3 Encounters:  08/08/20 125/85  06/11/20 126/70  06/03/20 123/72   Pulse Readings from Last 3 Encounters:  08/08/20 60  06/11/20 (!) 56  06/03/20 63   Wt Readings from Last 3  Encounters:  08/08/20 175 lb 2 oz (79.4 kg)  06/11/20 178 lb 2 oz (80.8 kg)  06/03/20 178 lb (80.7 kg)    Assessment: Review of patient past medical history, allergies, medications, health status, including review of consultants reports, laboratory and other test data, was performed as part of comprehensive evaluation and provision of chronic care management services.   SDOH:  (Social Determinants of Health) assessments and interventions performed:  SDOH Interventions    Flowsheet Row Most Recent Value  SDOH Interventions   Financial Strain Interventions Other (Comment)  [application for Eliquis patient assistance provided at previous appt but has not been completed yet.]       CCM Care Plan  Allergies  Allergen Reactions   Prednisolone Acetate     Increased intraocular pressure   Statins Other (See Comments)    Myalgias (intolerance) Simvastatin, atorvastatin, pravastatin    Medications Reviewed Today     Reviewed by [name], PharmD (Pharmacist) on 09/13/20 at 2054  Med List Status: <None>   Medication Order Taking? Sig Documenting Provider Last Dose Status Informant  acetaminophen (TYLENOL) 650 MG CR tablet [id number] Yes Take 1,300 mg by mouth every 8 (eight) hours as needed for pain. [provider] Taking Active Multiple Informants  ALPRAZolam (XANAX) 0.25 MG tablet [id number] Yes Take 1 tablet (0.25 mg total) by mouth 2 (two) times daily as needed for anxiety. Colon Branch, MD Taking Active   amLODipine (NORVASC) 5 MG tablet [id number] No Take 1 tablet (5 mg total) by mouth daily.  Patient not taking: Reported on 09/12/2020   Colon Branch, MD Not Taking Consider Medication Status and Discontinue   Arginine 500 MG CAPS [id number] Yes Take 1,000 mg by mouth daily.  [provider] Taking Active Multiple Informants  Baclofen 5 MG TABS [id number] Yes 1 tablet three times a day as needed for pain Cameron Sprang, MD Taking Active   Coenzyme Q10 100 MG TABS  [id number] Yes Take 1 tablet by mouth in the morning and at bedtime. [provider] Taking Active   ELIQUIS 5 MG TABS tablet [id number] Yes TAKE 1 TABLET BY MOUTH TWICE DAILY Hilty, Nadean Corwin, MD Taking Active   gabapentin (NEURONTIN) 300 MG capsule [id number] Yes Take 1 capsule (300 mg total) by mouth See admin instructions. Take 300 mg by mouth four times a day- morning, 8:30 PM, 9:30 PM, and 10:00 PM Cameron Sprang, MD Taking Active   Glucosamine-MSM-Hyaluronic Acd ([place]) [id number] Yes Take 1 capsule by mouth daily.  [provider] Taking Active Multiple Informants  ketorolac (ACULAR) 0.5 % ophthalmic solution [id number] Yes SMARTSIG:1 Drop(s) Right Eye 4 Times Daily PRN [provider] Taking Active   loratadine (CLARITIN) 10 MG tablet [id number] Yes Take 10 mg by  mouth at bedtime. [provider] Taking Active Multiple Informants           Med Note Pilar Plate Sep 12, 2016 11:40 AM)    losartan (COZAAR) 100 MG tablet [id number] Yes Take 0.5 tablets (50 mg total) by mouth daily. Colon Branch, MD Taking Active   metoprolol succinate (TOPROL-XL) 25 MG 24 hr tablet [id number] Yes Take 2.5 tablets (62.5 mg total) by mouth daily.  Patient taking differently: Take 62.5-75 mg by mouth daily.   Colon Branch, MD Taking Active   Multiple Vitamins-Minerals (OCUVITE PRESERVISION PO) [id number] Yes Take 1 capsule by mouth 2 (two) times daily.  [provider] Taking Active Multiple Informants  NON FORMULARY [id number] Yes Take 1-2 packets by mouth See admin instructions. Lypo-Spheric vitamin C gel packets- Mix 1-2 packets of gel into juice one to two times a day and drink [provider] Taking Active Multiple Informants  NON FORMULARY [id number] Yes Take 10 mg by mouth See admin instructions. [employer] Formulas Lyco-Sorb (for Prostate & Cardiovascular Health) 10 mg capsules: Take 10 mg by mouth two times a day [provider] Taking  Active Multiple Informants  Omega-3 Fatty Acids (FISH OIL PO) [id number] Yes Take 1 capsule by mouth in the morning and at bedtime. [provider] Taking Active Multiple Informants  omeprazole (PRILOSEC) 40 MG capsule [id number] Yes Take 1 capsule (40 mg total) by mouth daily. Colon Branch, MD Taking Active   rosuvastatin (CRESTOR) 40 MG tablet [id number] Yes Take 1 tablet (40 mg total) by mouth daily.  Patient taking differently: Take 20 mg by mouth daily.   Colon Branch, MD Taking Active   Vitamin D, Cholecalciferol, 25 MCG (1000 UT) TABS [id number] Yes Take 1 tablet by mouth daily. [provider] Taking Active             Patient Active Problem List   Diagnosis Date Noted   Chronic rhinitis 03/29/2019   TIA (transient ischemic attack) 12/03/2017   Chronic anticoagulation 12/03/2017   H/O: stroke 12/03/2017   Paroxysmal atrial fibrillation ([place]) 07/11/2017   SCC (squamous cell carcinoma) 06/22/2017   Varicose veins of both lower extremities 06/19/2017   Bilateral lower extremity edema 06/19/2017   Trichiasis without entropion of right lower eyelid 04/14/2017   Paresthesias 03/03/2017   Trigeminal neuralgia of right side of face 10/01/2016   History of mitral valve repair 09/30/2016   Cerebrovascular accident (CVA) ([place]) 09/12/2016   GERD (gastroesophageal reflux disease) 09/22/2015   PCP NOTES >>>>> 11/08/2014   Chest pain 06/07/2014   Facial neuropathy 04/02/2013   Anxiety and depression 02/26/2013   Annual physical exam 08/30/2012   HTN (hypertension) 01/22/2011   BPH (benign prostatic hyperplasia) 10/16/2008   Hyperglycemia 10/16/2008   Hyperlipidemia 06/15/2006   Mitral valve disease 06/15/2006   Allergic rhinitis 06/15/2006   COLON CANCER, HX OF 06/15/2006    Immunization History  Administered Date(s) Administered   Fluad Quad(high Dose 65+) 11/13/2018   Influenza Whole 03/10/2007, 11/17/2007   Influenza-Unspecified 01/17/2017   PFIZER(Purple  Top)SARS-COV-2 Vaccination 04/17/2019, 05/08/2019, 01/02/2020   Pneumococcal Conjugate-13 01/16/2014   Pneumococcal Polysaccharide-23 01/12/2005, 04/21/2010, 06/11/2020   Td 06/15/2006, 01/05/2015   Tdap 12/13/2016   Zoster Recombinat (Shingrix) 02/03/2017, 12/14/2018   Zoster, Live 03/25/1998   Conditions to be addressed/monitored: Atrial Fibrillation, CAD, HTN, HLD, Anxiety and trigeminal neuralgia with ocular effects; BPH; pre DM; h/o recurrent stroke   Care Plan : General Pharmacy (Adult)  Updates  made by [name], PHARMD since 09/13/2020 12:00 AM     Problem: Chronic Disease Management support, education, and care coordination needs related to  AFib, Pre-DM, HTN, HLD, Hx of Stroke, Anxiety/Depression, GERD, Allergic Rhinitis, Trigeminal Neuralgia   Priority: Medium  Onset Date: 06/19/2020  Note:   Current Barriers:  Does not adhere to prescribed medication regimen due to suspected intolerance Approaching Medicare coverage gap and needs assistance with cost of Eliquis Chronic Disease Management support, education, and care coordination needs related to  AFib, Pre-DM, HTN, HLD, Hx of Stroke, Anxiety/Depression, GERD, Allergic Rhinitis, Trigeminal Neuralgia  Pharmacist Clinical Goal(s):  Over the next 180 days, patient will maintain control of HTN, hyperlipidemia as evidenced by attainment of goals listed below  adhere to plan to optimize therapeutic regimen for hyperlipiemia as evidenced by report of adherence to recommended medication management changes contact provider office for questions/concerns as evidenced notation of same in electronic health record through collaboration with PharmD and provider.   Interventions: 1:1 collaboration with Colon Branch, MD regarding development and update of comprehensive plan of care as evidenced by provider attestation and co-signature Inter-disciplinary care team collaboration (see longitudinal plan of care) Comprehensive medication  review performed; medication list updated in electronic medical record   Hypertension BP Readings from Last 3 Encounters:  08/08/20 125/85  06/11/20 126/70  06/03/20 123/72  Currently at goal of BP <130/80 Current regimen:  Losartan 159m - take 0.5 tables = 544mdaily  Metoprolol succinate ER 2582m take 2.5 tablets daily (patient taking 2.5 to 3 tablets daily) Previous medication tried: amlodipine - stopped due to low BP / dizziness Interventions: Discussed blood pressure goal Recommended to check blood pressure 2 to 3 times per week, document, and provide at future appointments Ensure daily salt intake < 2300 mg/day  Hyperlipidemia  Goal < 70 (history of stroke) Currently at goal but has recently decrease dose of rosuvastatin due to suspected intolerance Patient reports having pain in arm and foot. He lowered dose of rosuvastatin from 22m42mily to 20mg56mly over the last 2 weeks and feels that pain is improving Current regimen:  Rosuvastatin 22mg 52my (patient is taking 0.5 tablet = 20mg d94m due to recent pain in arm and foot suspected to be related to rosuvastatin) Omega 3 - twice daily Coenzyme Q10 - 100 mg twice a day Interventions: Discussed LDL goal  Continue to take rosuvastatin 22mg 0.69mblet = 20mg dai102mnd Omega 3 fatty acids Consider rechecking LDL at next office visit. If LDL >70, consider adding back ezetimibe or trial of Nexletol or PSK9  Atrial Fibrillation:  Current regimen:  Metoprolol succinate ER 25mg - ta8m tablets = 75mg daily54miquis 5mg - take 4mablet twice a day Patient denies and signs or symptoms of bleeding Interventions: Reviewed Eliquis dosing - no need for dose adjustment Patient approaching Medicare coverage gap when cost of Eliquis will increase significantly. Discuss available patient assistance program. Mailed patient application to complete and return to office. Also discussed 3% out of pocket spend requirement.  Continue to  current regimen for atrial fibrillation and stroke prevention  Medication management Current pharmacy: [facility] Interventions Comprehensive medication review performed. Continue current medication management strategy Reviewed adherence through fill history. Noted that patient has not filled rosuvastatin in 2022 yet. Adherence addressed (see hyperlipidemia section above)  Patient is reminded and encouraged to report any questions or concerns to PharmD and/or provider (especially if you feel you cannot continue to take rosuvastatin)  Patient  Goals/Self-Care Activities Over the next 180 days, patient will:  take medications as prescribed, check blood pressure 2 to 3 times per week , document, and provide at future appointments, and collaborate with provider on medication access solutions  Follow Up Plan: Telephone follow up appointment with care management team member scheduled for:  Pharmacy team will follow up PAP application and adherence in 1 month; Clinical pharmacist outreach in 3 months.       Medication Assistance:  Patient has application for Eliquis patient assistance. Encouraged him to complete and bring report from pharmacy with 2022 total out of pocket medication expense.   Patient's preferred pharmacy is:  [facility] [place], [place] - [address] [place] [address] Phone: [phone] Fax: [phone]   Follow Up:  Patient agrees to Care Plan and Follow-up.  Plan: Telephone follow up appointment with care management team member scheduled for:  pharmacist to follow up in 3 months; will have pharmacy team check in regarding BP and dizziness in 2 to 4 weeks.   [name], PharmD Clinical Pharmacist [facility]

## 2020-09-13 NOTE — Patient Instructions (Signed)
Visit Information  PATIENT GOALS:  Goals Addressed             This Visit's Progress    Chronic Care Management Pharmacy Care Plan   On track    Kevin Bass (see longitudinal plan of care for additional care plan information)  Current Barriers:  Chronic Disease Management support, education, and care coordination needs related to  AFib, Pre-DM, HTN, HLD, Hx of Stroke, Anxiety/Depression, GERD, Allergic Rhinitis, Trigeminal Neuralgia   Hypertension BP Readings from Last 3 Encounters:  08/08/20 125/85  06/11/20 126/70  06/03/20 123/72  Pharmacist Clinical Goal(s): Over the next 180 days, patient will work with PharmD and providers to maintain BP goal <130/80 Current regimen:  Losartan 100mg  - take 0.5 tablet = 50mg  daily  Metoprolol succinate 25mg  daily - take 2.5 tablets = 62.5mg  daily Interventions: Discussed blood pressure goal Patient self care activities - Over the next 180 days, patient will: Check blood pressure 2 to 3 times per week, document, and provide at future appointments Ensure daily salt intake < 2300 mg/day  Hyperlipidemia Lab Results  Component Value Date/Time   LDLCALC 49 12/12/2019 01:56 PM  Pharmacist Clinical Goal(s): Over the next 180 days, patient will work with PharmD and providers to maintain LDL goal < 70 Current regimen:  Rosuvastatin 40mg  daily (patient is taking 0.5 tablet = 20mg  daily due to recent pain in arm and foot suspected to be related to rosuvastatin) Omega 3 - twice daily CoEnzyme Q10 100mg  twice a day Interventions: Discussed LDL goal  Patient self care activities - Over the next 180 days, patient will: Continue to take rosuvastatin 40mg  0.5 tablet = 20mg  daily  Atrial Fibrillation:  Pharmacist Clinical Goal(s): Over the next 180 days, patient will work with PharmD and providers to prevent stroke Current regimen:  Metoprolol succinate ER 25mg  - take 2.5 tablets = 62.5mg  daily  Eliquis 5mg  - take 1 tablet twice a  day Interventions: Reviewed Eliquis dosing - no need for dose adjustment Patient approaching Medicare coverage gap when cost of Eliquis will increase significantly. Discuss available patient assistance program. Mailed patient application to complete and return to office. Also discussed 3% out of pocket spend requirement.  Patient self care activities - Over the next 180 days, patient will: Continue to current regimen for atrial fibrillation and stroke prevention Complete application for Eliquis patient assistance.   Medication management Pharmacist Clinical Goal(s): Over the next 180 days, patient will work with PharmD and providers to maintain optimal medication adherence Current pharmacy: [facility] Interventions Comprehensive medication review performed. Continue current medication management strategy Patient self care activities - Over the next 180 days, patient will: Focus on medication adherence by filling and taking medications appropriately  Take medications as prescribed Report any questions or concerns to PharmD and/or provider(s) - especially if you feel you cannot continue to take rosuvastatin  Please see past updates related to this goal by clicking on the "Past Updates" button in the selected goal          Patient verbalizes understanding of instructions provided today and agrees to view in [place].   Telephone follow up appointment with care management team member scheduled for: pharmacy team will check in regarding BP and dizziness in 2 to 4 weeks; Clinical pharmacist visit scheduled or November 2022.   [name], PharmD Clinical Pharmacist [facility]

## 2020-10-08 ENCOUNTER — Telehealth: Payer: Self-pay | Admitting: Pharmacist

## 2020-10-08 NOTE — Chronic Care Management (AMB) (Signed)
Chronic Care Management Pharmacy Assistant   Name: Davaris Youtsey  MRN: [id number] DOB: [birth year]   Reason for Encounter: Disease State Hypertension    Recent office visits:  None noted  Recent consult visits:  None noted  Hospital visits:  None in previous 6 months  Medications: Outpatient Encounter Medications as of 10/08/2020  Medication Sig   acetaminophen (TYLENOL) 650 MG CR tablet Take 1,300 mg by mouth every 8 (eight) hours as needed for pain.   ALPRAZolam (XANAX) 0.25 MG tablet Take 1 tablet (0.25 mg total) by mouth 2 (two) times daily as needed for anxiety.   amLODipine (NORVASC) 5 MG tablet Take 1 tablet (5 mg total) by mouth daily. (Patient not taking: Reported on 09/12/2020)   Arginine 500 MG CAPS Take 1,000 mg by mouth daily.    Baclofen 5 MG TABS 1 tablet three times a day as needed for pain   Coenzyme Q10 100 MG TABS Take 1 tablet by mouth in the morning and at bedtime.   ELIQUIS 5 MG TABS tablet TAKE 1 TABLET BY MOUTH TWICE DAILY   gabapentin (NEURONTIN) 300 MG capsule Take 1 capsule (300 mg total) by mouth See admin instructions. Take 300 mg by mouth four times a day- morning, 8:30 PM, 9:30 PM, and 10:00 PM   Glucosamine-MSM-Hyaluronic Acd ([employer] [facility] PO) Take 1 capsule by mouth daily.    ketorolac (ACULAR) 0.5 % ophthalmic solution SMARTSIG:1 Drop(s) Right Eye 4 Times Daily PRN   loratadine (CLARITIN) 10 MG tablet Take 10 mg by mouth at bedtime.   losartan (COZAAR) 100 MG tablet Take 0.5 tablets (50 mg total) by mouth daily.   metoprolol succinate (TOPROL-XL) 25 MG 24 hr tablet Take 2.5 tablets (62.5 mg total) by mouth daily. (Patient taking differently: Take 62.5-75 mg by mouth daily.)   Multiple Vitamins-Minerals (OCUVITE PRESERVISION PO) Take 1 capsule by mouth 2 (two) times daily.    NON FORMULARY Take 1-2 packets by mouth See admin instructions. Lypo-Spheric vitamin C gel packets- Mix 1-2 packets of gel into juice one to two times a day and drink    NON FORMULARY Take 10 mg by mouth See admin instructions. [employer] Formulas Lyco-Sorb (for Prostate & Cardiovascular Health) 10 mg capsules: Take 10 mg by mouth two times a day   Omega-3 Fatty Acids (FISH OIL PO) Take 1 capsule by mouth in the morning and at bedtime.   omeprazole (PRILOSEC) 40 MG capsule Take 1 capsule (40 mg total) by mouth daily.   rosuvastatin (CRESTOR) 40 MG tablet Take 1 tablet (40 mg total) by mouth daily. (Patient taking differently: Take 20 mg by mouth daily.)   Vitamin D, Cholecalciferol, 25 MCG (1000 UT) TABS Take 1 tablet by mouth daily.   No facility-administered encounter medications on file as of 10/08/2020.   Current antihypertensive regimen:  Losartan 100mg  - take 0.5 tables = 50mg  daily  Metoprolol succinate ER 25mg  - take 2.5 tablets daily (patient taking 2.5 to 3 tablets daily)  How often are you checking your Blood Pressure? daily  Current home BP readings:          10/08/20 BP:115/65  What recent interventions/DTPs have been made by any provider to improve Blood Pressure control since last CPP Visit: No  Any recent hospitalizations or ED visits since last visit with CPP? No  What diet changes have been made to improve Blood Pressure Control?  Patient states he does not eat a lot of salty foods and has maintained his diet.  What exercise is being done to improve your Blood Pressure Control?  Patient states he does exercise he walks 1 mile every day.  Adherence Review: Is the patient currently on ACE/ARB medication? Yes Does the patient have >5 day gap between last estimated fill dates? Yes   Star Rating Drugs: Losartan 100 mg Last filled:07/25/20 90 DS Rosuvastatin 40 mg Last filled:09/23/20 90 DS   Myriam Elta [place], [place]

## 2020-10-13 ENCOUNTER — Telehealth: Payer: Self-pay | Admitting: Internal Medicine

## 2020-10-13 NOTE — Telephone Encounter (Signed)
Pt c/o medication issue:  1. Name of Medication: ELIQUIS 5 MG TABS tablet  2. How are you currently taking this medication (dosage and times per day)? As directed  3. Are you having a reaction (difficulty breathing--STAT)?   4. What is your medication issue? Patient needs Dr. Debara Pickett to fill out a page of his Kevin Bass Eliquis application. Patient wanted to know what he needs to do next

## 2020-10-13 NOTE — Telephone Encounter (Signed)
Returned call to Pt.  Advised to leave paperwork for Dr. Lysbeth Penner nurse at [facility] office.  Pt prefers to fax.  Gave fax # to [facility] office.

## 2020-10-22 NOTE — Telephone Encounter (Signed)
Returned call to patient left message on personal voice mail to call back. 

## 2020-10-22 NOTE — Telephone Encounter (Signed)
Follow up:     Patient returning a call back concerning some paper work.

## 2020-10-22 NOTE — Telephone Encounter (Signed)
Called patient he was wanting to know about Eliquis patient assistance.Advised I will send message to Dr.Hilty's RN.

## 2020-10-22 NOTE — Telephone Encounter (Signed)
Patient returning call.

## 2020-10-23 NOTE — Telephone Encounter (Signed)
Spoke with patient and advised eliquis assistance app is ready. He would like this mailed.

## 2020-11-14 ENCOUNTER — Other Ambulatory Visit: Payer: Self-pay | Admitting: Internal Medicine

## 2020-11-17 NOTE — Telephone Encounter (Signed)
Patient is NOT approved for assistance from [employer] for Eliquis as he has not met 3% OOP Rx expenses based on household income. He was notified of this via MyChart message and letter from [facility] will also be sent to patient, per fax

## 2020-11-18 ENCOUNTER — Encounter: Payer: Self-pay | Admitting: Family Medicine

## 2020-11-18 ENCOUNTER — Ambulatory Visit (INDEPENDENT_AMBULATORY_CARE_PROVIDER_SITE_OTHER): Payer: PPO | Admitting: Family Medicine

## 2020-11-18 ENCOUNTER — Other Ambulatory Visit: Payer: Self-pay

## 2020-11-18 VITALS — BP 122/78 | HR 63 | Temp 98.7°F | Ht 70.0 in | Wt 175.0 lb

## 2020-11-18 DIAGNOSIS — I10 Essential (primary) hypertension: Secondary | ICD-10-CM | POA: Diagnosis not present

## 2020-11-18 MED ORDER — METOPROLOL SUCCINATE ER 25 MG PO TB24: 75.0000 mg | ORAL_TABLET | Freq: Every day | ORAL | Status: DC

## 2020-11-18 MED ORDER — LOSARTAN POTASSIUM 100 MG PO TABS: 100.0000 mg | ORAL_TABLET | Freq: Every day | ORAL | 1 refills | Status: DC

## 2020-11-18 NOTE — Progress Notes (Signed)
Chief Complaint  Patient presents with   problems with Blood Pressure    Subjective Kevin Bass is a 80 y.o. male who presents for hypertension follow up. He does monitor home blood pressures. Going up and down over past couple days. They have been running as low as 100's/60's and as high as 168/95. He is compliant with medication- Toprol XL 75 mg/d) and losartan 75 mg/d.  Patient has these side effects of medication: none He is usually adhering to a healthy diet overall. Current exercise: walking No recent change in diet or physical activity.  No Cp or SOB.    Past Medical History:  Diagnosis Date   Allergy    Anxiety    Arthritis    hands   Atrial fibrillation ([place])    Colon cancer ([place])    s/p partial colectomy 1990, Cscope neg 7-09, next 2014   DISORDER, MITRAL VALVE    MV recontruction at [place] 2006----still needs ABX for SBE prophylaxis    Elevated PSA    prostate nodule, [name]   GERD (gastroesophageal reflux disease)    Heart murmur    no problems since surgery   HYPERGLYCEMIA, BORDERLINE 10/16/2008   HYPERLIPIDEMIA 06/15/2006   diet controlled   Hypertension    HYPERTROPHY PROSTATE W/O UR OBST & OTH LUTS 10/16/2008   Neuropathy    Right groin pain    Dr. Redmond Pulling   SCC (squamous cell carcinoma) 06/2017   s/p Mohs---[name]   Stroke [facility])    Wears partial dentures    lower partial    Exam BP 122/78   Pulse 63   Temp 98.7 F (37.1 C) (Oral)   Ht 5\' 10"  (1.778 m)   Wt 175 lb (79.4 kg)   SpO2 98%   BMI 25.11 kg/m  General:  well developed, well nourished, in no apparent distress Heart: RRR, no bruits, no LE edema Lungs: clear to auscultation, no accessory muscle use Psych: well oriented with normal range of affect and appropriate judgment/insight  Primary hypertension  Chronic, uncontrolled. Increase losartan from 75 mg/d to 100 mg/d. Hold on Norvasc until f/u. Cont Toprol XL 75 mg/d. Counseled on diet and exercise. F/u in 3 weeks w Dr.  [name]. The patient voiced understanding and agreement to the plan.  Blessing, DO 11/18/20  1:51 PM

## 2020-11-18 NOTE — Patient Instructions (Addendum)
Keep the diet clean and stay active.  Check your blood pressures 2-3 times per week, alternating the time of day you check it. If it is high, considering waiting 1-2 minutes and rechecking. If it gets higher, your anxiety is likely creeping up and we should avoid rechecking.   Continue the Toprol XL 75 mg daily. We are now taking 100 mg of the losartan with this instead of 3/4 of a tab. Hold on the amlodipine for now until you see Dr. [name] next.   Let us know if you need anything.

## 2020-11-19 ENCOUNTER — Other Ambulatory Visit: Payer: Self-pay | Admitting: Internal Medicine

## 2020-11-20 ENCOUNTER — Telehealth: Payer: Self-pay | Admitting: Internal Medicine

## 2020-11-20 MED ORDER — AMLODIPINE BESYLATE 5 MG PO TABS: 5.0000 mg | ORAL_TABLET | Freq: Every day | ORAL | 1 refills | Status: DC

## 2020-11-20 NOTE — Telephone Encounter (Signed)
Take a 5 mg dose of the amlodipine today and tomorrow AM then see what Dr. [name] would like to do further. Ty.

## 2020-11-20 NOTE — Telephone Encounter (Signed)
Called the patient and BP at 8am --165/90 At 9 am took meds and rechecked BP 15 mins ago was 184/96. Normally he said BP would be around 125/65 after taking his medication. He would like advisement what to do until seeing his PCP tomorrow 11/21/20

## 2020-11-20 NOTE — Telephone Encounter (Signed)
Called informed the patient of provider response/instructions. He verbalized understanding and agreed to do.

## 2020-11-20 NOTE — Telephone Encounter (Signed)
Patient called with bp concerns. He stated his blood pressure is high at the moment and would like suggestions on how to lower it until appointment 9/30 with [facility]. I offered to transfer patient to Triage he refused. He stated he has already contacted them this morning and they advised him to schedule an appointment. He would like to speak with Nani Ravens or CMA to go over appointment visit on 9/27.

## 2020-11-21 ENCOUNTER — Ambulatory Visit (INDEPENDENT_AMBULATORY_CARE_PROVIDER_SITE_OTHER): Payer: PPO | Admitting: Internal Medicine

## 2020-11-21 ENCOUNTER — Encounter: Payer: Self-pay | Admitting: Internal Medicine

## 2020-11-21 ENCOUNTER — Other Ambulatory Visit: Payer: Self-pay

## 2020-11-21 VITALS — BP 144/88 | HR 66 | Temp 97.9°F | Resp 16 | Ht 70.0 in | Wt 173.2 lb

## 2020-11-21 DIAGNOSIS — I10 Essential (primary) hypertension: Secondary | ICD-10-CM | POA: Diagnosis not present

## 2020-11-21 NOTE — Patient Instructions (Signed)
Go to the lab  Take losartan in the morning  Take metoprolol in the morning  Take amlodipine in the afternoon  Keep the appointment you have with me next month  Check the  blood pressure  daily BP GOAL is between 110/65 and  135/85. If it is consistently higher or lower, let me know  Drink plenty fluids

## 2020-11-21 NOTE — Progress Notes (Signed)
Subjective:    Patient ID: Kevin Bass, male    DOB: [birth year], 80 y.o.   MRN: [id number]  DOS:  11/21/2020 Type of visit - description: acute   Last seen by me 07/2020.  Since then, amlodipine was discontinued because BP was good.  Patient came to the office and saw another physician on 11/18/2020: He reported that his blood pressure was going up and down in the last couple of days, between 110/60 and 168/95. They adjusted his medication.  Review of Systems Denies chest pain or difficulty breathing No edema No headaches. Appetite is not great but he is eating okay. He drinks plenty of fluids He is not taking any NSAIDs   Past Medical History:  Diagnosis Date   Allergy    Anxiety    Arthritis    hands   Atrial fibrillation ([place])    Colon cancer ([place])    s/p partial colectomy 1990, Cscope neg 7-09, next 2014   DISORDER, MITRAL VALVE    MV recontruction at [place] 2006----still needs ABX for SBE prophylaxis    Elevated PSA    prostate nodule, [name]   GERD (gastroesophageal reflux disease)    Heart murmur    no problems since surgery   HYPERGLYCEMIA, BORDERLINE 10/16/2008   HYPERLIPIDEMIA 06/15/2006   diet controlled   Hypertension    HYPERTROPHY PROSTATE W/O UR OBST & OTH LUTS 10/16/2008   Neuropathy    Right groin pain    Dr. Redmond Pulling   SCC (squamous cell carcinoma) 06/2017   s/p Mohs---[name]   Stroke [facility])    Wears partial dentures    lower partial    Past Surgical History:  Procedure Laterality Date   COLECTOMY  1990   HERNIA REPAIR Left 2014   [employer] rep w/mesh- March 2014   LOOP RECORDER INSERTION N/A 12/01/2016   Procedure: LOOP RECORDER INSERTION;  Surgeon: Evans Lance, MD;  Location: [place] [facility];  Service: Cardiovascular;  Laterality: N/A;   MITRAL VALVE ANNULOPLASTY     [facility] 2006   TEE WITHOUT CARDIOVERSION N/A 10/05/2016   Procedure: TRANSESOPHAGEAL ECHOCARDIOGRAM (TEE);  Surgeon: [name], MD;  Location: [facility] ENDOSCOPY;   Service: Cardiovascular;  Laterality: N/A;    Allergies as of 11/21/2020       Reactions   Prednisolone Acetate    Increased intraocular pressure   Statins Other (See Comments)   Myalgias (intolerance) Simvastatin, atorvastatin, pravastatin        Medication List        Accurate as of November 21, 2020  2:40 PM. If you have any questions, ask your nurse or doctor.          acetaminophen 650 MG CR tablet Commonly known as: TYLENOL Take 1,300 mg by mouth every 8 (eight) hours as needed for pain.   ALPRAZolam 0.25 MG tablet Commonly known as: XANAX Take 1 tablet (0.25 mg total) by mouth 2 (two) times daily as needed for anxiety.   amLODipine 5 MG tablet Commonly known as: NORVASC Take 1 tablet (5 mg total) by mouth daily.   Arginine 500 MG Caps Take 1,000 mg by mouth daily.   Baclofen 5 MG Tabs 1 tablet three times a day as needed for pain   Coenzyme Q10 100 MG Tabs Take 1 tablet by mouth in the morning and at bedtime.   Eliquis 5 MG Tabs tablet Generic drug: apixaban TAKE 1 TABLET BY MOUTH TWICE DAILY   FISH OIL PO Take 1  capsule by mouth in the morning and at bedtime.   gabapentin 300 MG capsule Commonly known as: NEURONTIN Take 1 capsule (300 mg total) by mouth See admin instructions. Take 300 mg by mouth four times a day- morning, 8:30 PM, 9:30 PM, and 10:00 PM   JOINT HEALTH PO Take 1 capsule by mouth daily.   ketorolac 0.5 % ophthalmic solution Commonly known as: ACULAR SMARTSIG:1 Drop(s) Right Eye 4 Times Daily PRN   loratadine 10 MG tablet Commonly known as: CLARITIN Take 10 mg by mouth at bedtime.   losartan 100 MG tablet Commonly known as: COZAAR Take 1 tablet (100 mg total) by mouth daily.   metoprolol succinate 25 MG 24 hr tablet Commonly known as: TOPROL-XL TAKE 3 TABLETS(75 MG) BY MOUTH DAILY   NON FORMULARY Take 1-2 packets by mouth See admin instructions. Lypo-Spheric vitamin C gel packets- Mix 1-2 packets of gel into juice one  to two times a day and drink   NON FORMULARY Take 10 mg by mouth See admin instructions. [place] Formulas Lyco-Sorb (for Prostate & Cardiovascular Health) 10 mg capsules: Take 10 mg by mouth two times a day   OCUVITE PRESERVISION PO Take 1 capsule by mouth 2 (two) times daily.   omeprazole 40 MG capsule Commonly known as: PRILOSEC Take 1 capsule (40 mg total) by mouth daily.   rosuvastatin 40 MG tablet Commonly known as: CRESTOR Take 1 tablet (40 mg total) by mouth daily. What changed: how much to take   Vitamin D (Cholecalciferol) 25 MCG (1000 UT) Tabs Take 1 tablet by mouth daily.           Objective:   Physical Exam BP (!) 144/88 (BP Location: Left Arm, Patient Position: Sitting, Cuff Size: Small)   Pulse 66   Temp 97.9 F (36.6 C) (Oral)   Resp 16   Ht 5\' 10"  (1.778 m)   Wt 173 lb 4 oz (78.6 kg)   SpO2 96%   BMI 24.86 kg/m  General:   Well developed, NAD, BMI noted. HEENT:  Normocephalic . Face symmetric, atraumatic Lungs:  CTA B Normal respiratory effort, no intercostal retractions, no accessory muscle use. Heart: RRR,  no murmur.  Lower extremities: no pretibial edema bilaterally  Skin: Not pale. Not jaundice Neurologic:  alert & oriented X3.  Speech normal, gait appropriate for age and unassisted Psych--  Cognition and judgment appear intact.  Cooperative with normal attention span and concentration.  Behavior appropriate. No anxious or depressed appearing.      Assessment    Assessment Prediabetes HTN Hyperlipidemia, statin intolerant (simva-pravachol: shoulder pain) Anxiety- rarely takes xanax Neuro  --Facial neuropathy dx 2015, no w/u, sx resurface x1 after temporary d/c of meds  --saw neuro again 02/2017, labs (-), had a NCS, Rx gaba -- strokes CV: --Mitral valve annuloplasty 2006-- needs SBE prophylaxis -- SOB 04-2016, seen at [facility], had a ECHO Nl fx MV, cath: no CAD --Cryptogenic stroke 09/12/2016, had slurred speech. Was recommended a  loop recorder.started plavix -- new R PICA stroke, admitted 11-30-16, implanted loop recorder placed --A-FIB noted on ILR ~ 12-21-2016, changed to eliquis  -- 11/2017: Acute L Hippocampus CVA with acute L posterior cerabral artery Elevated PSA and BPH-- Dr. [name] Colon cancer partial colectomy 1990, colonoscopy 08-2007 negative, Cscope again 09-2012 normal, 5 years  SCC . MOHS 06/2017  PLAN: HTN: Seen here 11/18/2020, BP was going up and down.  At that time they recommended: - Increase losartan from 75 mg to 100 mg -  Continue Toprol-XL 75 mg qd . (He has not been on amlodipine x months , d/c d/t normal to lowish BP) Yesterday, his BP was 194/96 and 160/85 so he decided to restart amlodipine. This morning, BP at home was 135/71. Plan: Continue losartan 100 mg in the morning, metoprolol 75 mg in the morning, restart amlodipine 5 mg in the afternoon.  Monitor BPs, check BMP, will see him next month for his already scheduled appointment. He verbalized understanding.   This visit occurred during the SARS-CoV-2 public health emergency.  Safety protocols were in place, including screening questions prior to the visit, additional usage of staff PPE, and extensive cleaning of exam room while observing appropriate contact time as indicated for disinfecting solutions.

## 2020-11-21 NOTE — Assessment & Plan Note (Signed)
HTN: Seen here 11/18/2020, BP was going up and down.  At that time they recommended: - Increase losartan from 75 mg to 100 mg - Continue Toprol-XL 75 mg qd . (He has not been on amlodipine x months , d/c d/t normal to lowish BP) Yesterday, his BP was 194/96 and 160/85 so he decided to restart amlodipine. This morning, BP at home was 135/71. Plan: Continue losartan 100 mg in the morning, metoprolol 75 mg in the morning, restart amlodipine 5 mg in the afternoon.  Monitor BPs, check BMP, will see him next month for his already scheduled appointment. He verbalized understanding.

## 2020-11-22 LAB — BASIC METABOLIC PANEL
BUN: 18 mg/dL (ref 7–25)
CO2: 28 mmol/L (ref 20–32)
Calcium: 9.6 mg/dL (ref 8.6–10.3)
Chloride: 103 mmol/L (ref 98–110)
Creat: 1.04 mg/dL (ref 0.70–1.22)
Glucose, Bld: 92 mg/dL (ref 65–99)
Potassium: 4.5 mmol/L (ref 3.5–5.3)
Sodium: 142 mmol/L (ref 135–146)

## 2020-12-08 ENCOUNTER — Ambulatory Visit: Payer: PPO | Admitting: Internal Medicine

## 2020-12-10 ENCOUNTER — Encounter: Payer: Self-pay | Admitting: Neurology

## 2020-12-10 ENCOUNTER — Other Ambulatory Visit: Payer: Self-pay

## 2020-12-10 ENCOUNTER — Ambulatory Visit: Payer: PPO | Admitting: Neurology

## 2020-12-10 VITALS — BP 117/65 | HR 77 | Ht 70.0 in | Wt 175.4 lb

## 2020-12-10 DIAGNOSIS — G500 Trigeminal neuralgia: Secondary | ICD-10-CM

## 2020-12-10 DIAGNOSIS — G629 Polyneuropathy, unspecified: Secondary | ICD-10-CM | POA: Diagnosis not present

## 2020-12-10 DIAGNOSIS — I639 Cerebral infarction, unspecified: Secondary | ICD-10-CM | POA: Diagnosis not present

## 2020-12-10 MED ORDER — GABAPENTIN 300 MG PO CAPS: 300.0000 mg | ORAL_CAPSULE | ORAL | 3 refills | Status: DC

## 2020-12-10 MED ORDER — BACLOFEN 5 MG PO TABS: ORAL_TABLET | ORAL | 6 refills | Status: DC

## 2020-12-10 NOTE — Patient Instructions (Signed)
Always good to see you!  Continue Gabapentin 300mg : take 1 in AM, 3 caps in evening  2. Continue control of blood pressure, cholesterol, sugar levels  3. Follow-up in 8 months, call for any changes    RECOMMENDATIONS FOR ALL PATIENTS WITH MEMORY PROBLEMS: 1. Continue to exercise (Recommend 30 minutes of walking everyday, or 3 hours every week) 2. Increase social interactions - continue going to [place] and enjoy social gatherings with friends and family 3. Eat healthy, avoid fried foods and eat more fruits and vegetables 4. Maintain adequate blood pressure, blood sugar, and blood cholesterol level. Reducing the risk of stroke and cardiovascular disease also helps promoting better memory. 5. Avoid stressful situations. Live a simple life and avoid aggravations. Organize your time and prepare for the next day in anticipation. 6. Sleep well, avoid any interruptions of sleep and avoid any distractions in the bedroom that may interfere with adequate sleep quality 7. Avoid sugar, avoid sweets as there is a strong link between excessive sugar intake, diabetes, and cognitive impairment We discussed the Mediterranean diet, which has been shown to help patients reduce the risk of progressive memory disorders and reduces cardiovascular risk. This includes eating fish, eat fruits and green leafy vegetables, nuts like almonds and hazelnuts, walnuts, and also use olive oil. Avoid fast foods and fried foods as much as possible. Avoid sweets and sugar as sugar use has been linked to worsening of memory function.      Mediterranean Diet  Why follow it? Research shows. Those who follow the Mediterranean diet have a reduced risk of heart disease  The diet is associated with a reduced incidence of Parkinson's and Alzheimer's diseases People following the diet may have longer life expectancies and lower rates of chronic diseases  The Dietary Guidelines for Americans recommends the Mediterranean diet as an eating  plan to promote health and prevent disease  What Is the Mediterranean Diet?  Healthy eating plan based on typical foods and recipes of Mediterranean-style cooking The diet is primarily a plant based diet; these foods should make up a majority of meals   Starches - Plant based foods should make up a majority of meals - They are an important sources of vitamins, minerals, energy, antioxidants, and fiber - Choose whole grains, foods high in fiber and minimally processed items  - Typical grain sources include wheat, oats, barley, corn, brown rice, bulgar, farro, millet, polenta, couscous  - Various types of beans include chickpeas, lentils, fava beans, black beans, white beans   Fruits  Veggies - Large quantities of antioxidant rich fruits & veggies; 6 or more servings  - Vegetables can be eaten raw or lightly drizzled with oil and cooked  - Vegetables common to the traditional Mediterranean Diet include: artichokes, arugula, beets, broccoli, brussel sprouts, cabbage, carrots, celery, collard greens, cucumbers, eggplant, kale, leeks, lemons, lettuce, mushrooms, okra, onions, peas, peppers, potatoes, pumpkin, radishes, rutabaga, shallots, spinach, sweet potatoes, turnips, zucchini - Fruits common to the Mediterranean Diet include: apples, apricots, avocados, cherries, clementines, dates, figs, grapefruits, grapes, melons, nectarines, oranges, peaches, pears, pomegranates, strawberries, tangerines  Fats - Replace butter and margarine with healthy oils, such as olive oil, canola oil, and tahini  - Limit nuts to no more than a handful a day  - Nuts include walnuts, almonds, pecans, pistachios, pine nuts  - Limit or avoid candied, honey roasted or heavily salted nuts - Olives are central to the Mediterranean diet - can be eaten whole or used in a variety of  dishes   Meats Protein - Limiting red meat: no more than a few times a month - When eating red meat: choose lean cuts and keep the portion to the  size of deck of cards - Eggs: approx. 0 to 4 times a week  - Fish and lean poultry: at least 2 a week  - Healthy protein sources include, chicken, [place], lean beef, lamb - Increase intake of seafood such as tuna, salmon, trout, mackerel, shrimp, scallops - Avoid or limit high fat processed meats such as sausage and bacon  Dairy - Include moderate amounts of low fat dairy products  - Focus on healthy dairy such as fat free yogurt, skim milk, low or reduced fat cheese - Limit dairy products higher in fat such as whole or 2% milk, cheese, ice cream  Alcohol - Moderate amounts of red wine is ok  - No more than 5 oz daily for women (all ages) and men older than age 47  - No more than 10 oz of wine daily for men younger than 29  Other - Limit sweets and other desserts  - Use herbs and spices instead of salt to flavor foods  - Herbs and spices common to the traditional Mediterranean Diet include: basil, bay leaves, chives, cloves, cumin, fennel, garlic, lavender, marjoram, mint, oregano, parsley, pepper, rosemary, sage, savory, sumac, tarragon, thyme   It's not just a diet, it's a lifestyle:  The Mediterranean diet includes lifestyle factors typical of those in the region  Foods, drinks and meals are best eaten with others and savored Daily physical activity is important for overall good health This could be strenuous exercise like running and aerobics This could also be more leisurely activities such as walking, housework, yard-work, or taking the stairs Moderation is the key; a balanced and healthy diet accommodates most foods and drinks Consider portion sizes and frequency of consumption of certain foods   Meal Ideas & Options:  Breakfast:  Whole wheat toast or whole wheat English muffins with peanut butter & hard boiled egg Steel cut oats topped with apples & cinnamon and skim milk  Fresh fruit: banana, strawberries, melon, berries, peaches  Smoothies: strawberries, bananas, greek yogurt,  peanut butter Low fat greek yogurt with blueberries and granola  Egg white omelet with spinach and mushrooms Breakfast couscous: whole wheat couscous, apricots, skim milk, cranberries  Sandwiches:  Hummus and grilled vegetables (peppers, zucchini, squash) on whole wheat bread   Grilled chicken on whole wheat pita with lettuce, tomatoes, cucumbers or tzatziki  [place] salad on whole wheat bread: tuna salad made with greek yogurt, olives, red peppers, capers, green onions Garlic rosemary lamb pita: lamb sauted with garlic, rosemary, salt & pepper; add lettuce, cucumber, greek yogurt to pita - flavor with lemon juice and black pepper  Seafood:  Mediterranean grilled salmon, seasoned with garlic, basil, parsley, lemon juice and black pepper Shrimp, lemon, and spinach whole-grain pasta salad made with low fat greek yogurt  Seared scallops with lemon orzo  Seared tuna steaks seasoned salt, pepper, coriander topped with tomato mixture of olives, tomatoes, olive oil, minced garlic, parsley, green onions and cappers  Meats:  Herbed greek chicken salad with kalamata olives, cucumber, feta  Red bell peppers stuffed with spinach, bulgur, lean ground beef (or lentils) & topped with feta   Kebabs: skewers of chicken, tomatoes, onions, zucchini, squash  [place] burgers: made with red onions, mint, dill, lemon juice, feta cheese topped with roasted red peppers Vegetarian Cucumber salad: cucumbers, artichoke hearts,  celery, red onion, feta cheese, tossed in olive oil & lemon juice  Hummus and whole grain pita points with a greek salad (lettuce, tomato, feta, olives, cucumbers, red onion) Lentil soup with celery, carrots made with vegetable broth, garlic, salt and pepper  Tabouli salad: parsley, bulgur, mint, scallions, cucumbers, tomato, radishes, lemon juice, olive oil, salt and pepper.

## 2020-12-10 NOTE — Progress Notes (Signed)
NEUROLOGY FOLLOW UP OFFICE NOTE  Kevin Bass [id number] 03/21/1940  HISTORY OF PRESENT ILLNESS: I had the pleasure of seeing Kevin Bass in follow-up in the neurology clinic on 12/10/2020.  The patient was last seen 6 months ago for trigeminal neuralgia and recurrent strokes on Eliquis. Since his last visit, symptoms have been overall stable. Trigeminal neuralgia has been quiet, he is taking Gabapentin 325m in AM, 9050min PM (spacing out evening dose 1/hour at night). He denies any focal weakness. Neuropathy on right side still bothers him. He denies any headaches, dizziness, vision changes, no falls. His short-term memory is getting worse, he has word-finding difficulties. He lives alone. He denies getting lost driving, using his GPS regularly. He denies missing medications or bill payments. He denies leaving the stove on or misplacing things.    History on Initial Assessment 10/01/2016: This is a pleasant 80o RH man with a history of colon cancer, hyperlipidemia, previous mitral valve repair in his usual state of health until 09/11/16 after taking a walk, he saw his brother-in-law and when he started noticing word-finding difficulties. His wife reported that his speech was not necessarily slurred but he was having trouble finding his words. When symptoms were unchanged the next day, he decided to go to the ER. He was still noted to have mild word-finding difficulties. There was no focal numbness/tingling/weakness. He denied any headaches, dizziness, diplopia, dysarthria/dysphagia. I personally reviewed MRI brain without contrast which showed an acute infarct in the left posterior insula and parietal operculum. MRA head and carotid dopplers did not show any significant stenosis. Echocardiogram showed EF 60-65%, mild LVH, prior MV repair without significant stenosis or regurgitation, normal LA size. He was switched from aspirin to Plavix. LDL was 124, HbA1c was 5.6. He was unable to get  a TEE in the hospital, and has seen Dr. HiDebara Pickettnd agreed to having the procedure done on 8/14 followed by loop recorder. He denies any palpitations, chest pain, shortness of breath. No neck/back pain, bowel/bladder dysfunction. No falls. He denies any side effect on Plavix. He was started on Crestor yesterday. He feels he is 82% or more back to baseline, he denies any confusion or focal symptoms. He reports a history of facial pain on the right upper lip radiating up his cheek with good response to gabapentin. He takes 30049mhs with no side effects.   Update 12/07/16: He had no residual deficits from prior stroke, then on 11/29/16 he had sudden onset vertigo that made him fall backward. This lasted for 15 seconds and was followed by a mild headache. He called triage and was instructed to go seek medical attention the next day. He went to the ER and was found to have a right PICA territory stroke. He was evaluated by Neurology with no focal deficits seen. He reports he was taking aspirin and Plavix when the stroke occurred. He had an extensive workup for the prior stroke, he had a TEE in August 2018 which showed normal left atrium, no thrombus seen. He had a 48-hour monitor which was unremarkable. In the hospital, he had a CTA of the head and neck which did not show any occlusion or stenosis. Repeat TTE showed EF of 55-60%, left atrium mildly dilated. He had a CT chest/abdomen/pelvis which did not show any evidence of malignancy. On hospital discharge, he was instructed to continue aspirin and Plavix and stop his antihypertensives. He had a loop recorder placed.   Update 12/06/2017: He underwent right median  nerve and ulnar nerve decompression on 11/28/17. He had stopped the Eliquis for 2 days for the procedure, and restarted Eliquis on 11/29/17. On 12/02/17, he took a nap and woke up with right arm weakness and numbness. He could not lift his right arm up. After 20 minutes, he started getting more feeling in his  arm. Per records, he stood up and noticed his gait was off. The weakness improved after several minutes, but he continued to feel off balance. He went to the ER where SBP was elevated at 170. NIH 0 in the hospital. Records were reviewed, he had an MRI brain without contrast showing an acute infarct in the left hippocampus, occlusion of the left PCA which could be acute. There were chronic infarcts in the right cerebellum and left parietal operculum. He had an echo which showed an EF of 55-60%, left atrium normal, right atrium moderately dilated. No significant stenosis on carotid dopplers. LDL in August 2019 was 120, he was advised to increase Crestor to 90m daily. He was having muscle pains and stopped statin for 5 months, he was restarted Crestor with Zetia last August.  He was discharged home 2 days ago and reports that this is the worst of his strokes. It has affected his memory more than anything. He has also noticed vision changes on the right visual field, he describes blurred/double vision reading on his right side. He feels his strength is back but his balance is still off. He also has left foot pain. He has been dealing with his wife and sister's recent deaths.    Diagnostic Data: Neuropathy labs were normal (TSH, B12, ESR, folate, SPEP/IFE). He had an EMG/NCV of the right UE and LE which showed right ulnar neuropathy with slowing across the elbow, axon loss and demyelinating in type, mild to moderate in degree; right median neuropathy at or distal to the wrist, consistent with carpal tunnel, mild in degree, and probable L5 radiculopathy, mild. No evidence of a large fiber sensorimotor polyneuropathy.     PAST MEDICAL HISTORY: Past Medical History:  Diagnosis Date   Allergy    Anxiety    Arthritis    hands   [place]    [place]    s/p partial colectomy 1990, Cscope neg 7-09, next 2014   DISORDER, MITRAL VALVE    [facility]still needs [facility]  for SBE prophylaxis    Elevated PSA    prostate nodule, Dr BAlinda Money  GERD (gastroesophageal reflux disease)    Heart murmur    no problems since surgery   HYPERGLYCEMIA, BORDERLINE 10/16/2008   HYPERLIPIDEMIA 06/15/2006   diet controlled   Hypertension    HYPERTROPHY PROSTATE W/O UR OBST & OTH LUTS 10/16/2008   Neuropathy    Right groin pain    Dr. WRedmond Pulling  SCC (squamous cell carcinoma) 06/2017   s/p Mohs---Dr BMilana Na  [facility]    Wears partial dentures    lower partial    MEDICATIONS: Current Outpatient Medications on File Prior to Visit  Medication Sig Dispense Refill   acetaminophen (TYLENOL) 650 MG CR tablet Take 1,300 mg by mouth every 8 (eight) hours as needed for pain.     ALPRAZolam (XANAX) 0.25 MG tablet Take 1 tablet (0.25 mg total) by mouth 2 (two) times daily as needed for anxiety. 60 tablet 0   amLODipine (NORVASC) 5 MG tablet Take 1 tablet (5 mg total) by mouth daily. 90 tablet 1   Arginine  500 MG CAPS Take 1,000 mg by mouth daily.      Baclofen 5 MG TABS 1 tablet three times a day as needed for pain 90 tablet 6   Coenzyme Q10 100 MG TABS Take 1 tablet by mouth in the morning and at bedtime.     ELIQUIS 5 MG TABS tablet TAKE 1 TABLET BY MOUTH TWICE DAILY 180 tablet 1   gabapentin (NEURONTIN) 300 MG capsule Take 1 capsule (300 mg total) by mouth See admin instructions. Take 300 mg by mouth four times a day- morning, 8:30 PM, 9:30 PM, and 10:00 PM 360 capsule 3   Glucosamine-MSM-Hyaluronic Acd (JOINT HEALTH PO) Take 1 capsule by mouth daily.      ketorolac (ACULAR) 0.5 % ophthalmic solution SMARTSIG:1 Drop(s) Right Eye 4 Times Daily PRN     loratadine (CLARITIN) 10 MG tablet Take 10 mg by mouth at bedtime.     losartan (COZAAR) 100 MG tablet Take 1 tablet (100 mg total) by mouth daily. 45 tablet 1   metoprolol succinate (TOPROL-XL) 25 MG 24 hr tablet TAKE 3 TABLETS(75 MG) BY MOUTH DAILY 270 tablet 0   Multiple Vitamins-Minerals (OCUVITE PRESERVISION PO) Take 1  capsule by mouth 2 (two) times daily.      NON FORMULARY Take 1-2 packets by mouth See admin instructions. Lypo-Spheric vitamin C gel packets- Mix 1-2 packets of gel into juice one to two times a day and drink     NON FORMULARY Take 10 mg by mouth See admin instructions. Jarrow Formulas Lyco-Sorb (for Prostate & Cardiovascular Health) 10 mg capsules: Take 10 mg by mouth two times a day     Omega-3 Fatty Acids (FISH OIL PO) Take 1 capsule by mouth in the morning and at bedtime.     omeprazole (PRILOSEC) 40 MG capsule Take 1 capsule (40 mg total) by mouth daily. 90 capsule 1   rosuvastatin (CRESTOR) 40 MG tablet Take 1 tablet (40 mg total) by mouth daily. (Patient taking differently: Take 20 mg by mouth daily.) 90 tablet 1   Vitamin D, Cholecalciferol, 25 MCG (1000 UT) TABS Take 1 tablet by mouth daily.     No current facility-administered medications on file prior to visit.    ALLERGIES: Allergies  Allergen Reactions   Prednisolone Acetate     Increased intraocular pressure   Statins Other (See Comments)    Myalgias (intolerance) Simvastatin, atorvastatin, pravastatin    FAMILY HISTORY: Family History  Problem Relation Age of Onset   Colon cancer Other        M side (cousins)   Diabetes Neg Hx    Stroke Neg Hx    CAD Neg Hx    Prostate cancer Neg Hx    Rectal cancer Neg Hx    Esophageal cancer Neg Hx     SOCIAL HISTORY: Social History   Socioeconomic History   Marital status: Widowed    Spouse name: suzanne   Number of children: 2   Years of education: college   Highest education level: Not on file  Occupational History   Occupation: retired   Tobacco Use   Smoking status: Former    Packs/day: 0.50    Years: 20.00    Pack years: 10.00    Types: Cigarettes    Quit date: 02/23/1983    Years since quitting: 37.8   Smokeless tobacco: Never  Vaping Use   Vaping Use: Never used  Substance and Sexual Activity   Alcohol use: Not Currently  Alcohol/week: 7.0 standard  drinks    Types: 7 Glasses of wine per week   Drug use: No   Sexual activity: Not Currently  Other Topics Concern   Not on file  Social History Narrative   From [place], lost wife 08/2017 to cancer   Lives by himself    Daughter in [place], son in [place]   Right handed      Social Determinants of Health   Financial Resource Strain: Low Risk    Difficulty of Paying Living Expenses: Not very hard  Food Insecurity: Not on file  Transportation Needs: Not on file  Physical Activity: Not on file  Stress: Not on file  Social Connections: Not on file  Intimate Partner Violence: Not on file     PHYSICAL EXAM: Vitals:   12/10/20 1431  BP: 117/65  Pulse: 77  SpO2: 100%   General: No acute distress Head:  Normocephalic/atraumatic Skin/Extremities: No rash, no edema Neurological Exam: alert and awake. No aphasia or dysarthria. Fund of knowledge is appropriate.   Attention and concentration are normal.   Cranial nerves: Pupils equal, round. Extraocular movements intact with no nystagmus. Visual fields full.  No facial asymmetry.  Motor: Bulk and tone normal, muscle strength 5/5 throughout with no pronator drift.   Finger to nose testing intact.  Gait narrow-based and steady, no ataxia.   IMPRESSION: This is a pleasant 80 yo RH man with a history of colon cancer, hyperlipidemia, previous mitral valve repair with recurrent strokes (x3) secondary to atrial fibrillation, neuropathy, trigeminal neuralgia. No residual deficits from strokes except some mild cognitive changes, no difficulties with complex tasks. Neuropathy and trigeminal neuralgia stable, continue Gabapentin 328m in AM, 9060min PM and Baclofen 69m58mID prn, refills sent. Continue Eliquis and control of vascular risk factors for secondary stroke prevention. Follow-up in 8 months, call for any changes.    Thank you for allowing me to participate in his care.  Please do not hesitate to call for any questions or concerns.    KarEllouise Newer.D.   CC: Dr. PazLarose Kells

## 2020-12-12 ENCOUNTER — Other Ambulatory Visit: Payer: Self-pay

## 2020-12-12 ENCOUNTER — Ambulatory Visit (INDEPENDENT_AMBULATORY_CARE_PROVIDER_SITE_OTHER): Payer: PPO | Admitting: Internal Medicine

## 2020-12-12 ENCOUNTER — Telehealth: Payer: Self-pay | Admitting: Internal Medicine

## 2020-12-12 ENCOUNTER — Encounter: Payer: Self-pay | Admitting: Internal Medicine

## 2020-12-12 VITALS — BP 118/64 | HR 56 | Temp 97.6°F | Resp 18 | Ht 70.0 in | Wt 171.1 lb

## 2020-12-12 DIAGNOSIS — I10 Essential (primary) hypertension: Secondary | ICD-10-CM | POA: Diagnosis not present

## 2020-12-12 DIAGNOSIS — R739 Hyperglycemia, unspecified: Secondary | ICD-10-CM | POA: Diagnosis not present

## 2020-12-12 DIAGNOSIS — Z0001 Encounter for general adult medical examination with abnormal findings: Secondary | ICD-10-CM

## 2020-12-12 DIAGNOSIS — R079 Chest pain, unspecified: Secondary | ICD-10-CM | POA: Diagnosis not present

## 2020-12-12 DIAGNOSIS — Z0000 Encounter for general adult medical examination without abnormal findings: Secondary | ICD-10-CM

## 2020-12-12 DIAGNOSIS — F419 Anxiety disorder, unspecified: Secondary | ICD-10-CM

## 2020-12-12 DIAGNOSIS — F32A Depression, unspecified: Secondary | ICD-10-CM

## 2020-12-12 DIAGNOSIS — E785 Hyperlipidemia, unspecified: Secondary | ICD-10-CM

## 2020-12-12 DIAGNOSIS — Z23 Encounter for immunization: Secondary | ICD-10-CM

## 2020-12-12 DIAGNOSIS — Z79899 Other long term (current) drug therapy: Secondary | ICD-10-CM

## 2020-12-12 MED ORDER — NITROGLYCERIN 0.4 MG SL SUBL: 0.4000 mg | SUBLINGUAL_TABLET | SUBLINGUAL | 1 refills | Status: DC | PRN

## 2020-12-12 NOTE — Progress Notes (Signed)
Subjective:    Patient ID: Kevin Bass, male    DOB: March 04, 1940, 80 y.o.   MRN: [id number]  DOS:  12/12/2020 Type of visit - description: CPX  in general feels but did report chest pain: This is happening for 2 to 3 weeks. Typically located at the left side of the chest, no radiation, usually when he is about to finish his daily one mile walk. No associated nausea or diaphoresis. Some belching Pain decreased after he stops walking. Denies DOE or lower extremity edema.  Review of Systems  Other than above, a 14 point review of systems is negative    Past Medical History:  Diagnosis Date   Allergy    Anxiety    Arthritis    hands   Atrial fibrillation ([place])    Colon cancer ([place])    s/p partial colectomy 1990, Cscope neg 7-09, next 2014   DISORDER, MITRAL VALVE    MV recontruction at [place] 2006----still needs ABX for SBE prophylaxis    Elevated PSA    prostate nodule, [name]   GERD (gastroesophageal reflux disease)    Heart murmur    no problems since surgery   HYPERGLYCEMIA, BORDERLINE 10/16/2008   HYPERLIPIDEMIA 06/15/2006   diet controlled   Hypertension    HYPERTROPHY PROSTATE W/O UR OBST & OTH LUTS 10/16/2008   Neuropathy    Right groin pain    Dr. Redmond Pulling   SCC (squamous cell carcinoma) 06/2017   s/p Mohs---[name]   Stroke [facility])    Wears partial dentures    lower partial    Past Surgical History:  Procedure Laterality Date   COLECTOMY  1990   HERNIA REPAIR Left 2014   [facility] rep w/mesh- March 2014   LOOP RECORDER INSERTION N/A 12/01/2016   Procedure: LOOP RECORDER INSERTION;  Surgeon: Evans Lance, MD;  Location: [facility];  Service: Cardiovascular;  Laterality: N/A;   MITRAL VALVE ANNULOPLASTY     [facility] 2006   TEE WITHOUT CARDIOVERSION N/A 10/05/2016   Procedure: TRANSESOPHAGEAL ECHOCARDIOGRAM (TEE);  Surgeon: [name], MD;  Location: [facility] ENDOSCOPY;  Service: Cardiovascular;  Laterality: N/A;    Social History    Socioeconomic History   Marital status: Widowed    Spouse name: suzanne   Number of children: 2   Years of education: college   Highest education level: Not on file  Occupational History   Occupation: retired   Tobacco Use   Smoking status: Former    Packs/day: 0.50    Years: 20.00    Pack years: 10.00    Types: Cigarettes    Quit date: 02/23/1983    Years since quitting: 37.8   Smokeless tobacco: Never  Vaping Use   Vaping Use: Never used  Substance and Sexual Activity   Alcohol use: Yes    Alcohol/week: 7.0 standard drinks    Types: 7 Glasses of wine per week   Drug use: No   Sexual activity: Not Currently  Other Topics Concern   Not on file  Social History Narrative   From [place], lost wife 08/2017 to cancer   Lives by himself    Daughter in [place], son in [place]   Right handed      Social Determinants of Health   Financial Resource Strain: Low Risk    Difficulty of Paying Living Expenses: Not very hard  Food Insecurity: Not on file  Transportation Needs: Not on file  Physical Activity: Not on file  Stress: Not  on file  Social Connections: Not on file  Intimate Partner Violence: Not on file     Allergies as of 12/12/2020       Reactions   Prednisolone Acetate    Increased intraocular pressure   Statins Other (See Comments)   Myalgias (intolerance) Simvastatin, atorvastatin, pravastatin        Medication List        Accurate as of December 12, 2020 11:59 PM. If you have any questions, ask your nurse or doctor.          acetaminophen 650 MG CR tablet Commonly known as: TYLENOL Take 1,300 mg by mouth every 8 (eight) hours as needed for pain.   ALPRAZolam 0.25 MG tablet Commonly known as: XANAX Take 1 tablet (0.25 mg total) by mouth 2 (two) times daily as needed for anxiety.   amLODipine 5 MG tablet Commonly known as: NORVASC Take 1 tablet (5 mg total) by mouth daily.   Arginine 500 MG Caps Take 1,000 mg by mouth daily.   Baclofen 5 MG  Tabs 1 tablet three times a day as needed for pain   Coenzyme Q10 100 MG Tabs Take 1 tablet by mouth in the morning and at bedtime.   Eliquis 5 MG Tabs tablet Generic drug: apixaban TAKE 1 TABLET BY MOUTH TWICE DAILY   FISH OIL PO Take 1 capsule by mouth in the morning and at bedtime.   gabapentin 300 MG capsule Commonly known as: NEURONTIN Take 1 capsule (300 mg total) by mouth See admin instructions. Take 300 mg by mouth four times a day- morning, 8:30 PM, 9:30 PM, and 10:00 PM   JOINT HEALTH PO Take 1 capsule by mouth daily.   ketorolac 0.5 % ophthalmic solution Commonly known as: ACULAR SMARTSIG:1 Drop(s) Right Eye 4 Times Daily PRN   loratadine 10 MG tablet Commonly known as: CLARITIN Take 10 mg by mouth at bedtime.   losartan 100 MG tablet Commonly known as: COZAAR Take 1 tablet (100 mg total) by mouth daily.   metoprolol succinate 25 MG 24 hr tablet Commonly known as: TOPROL-XL TAKE 3 TABLETS(75 MG) BY MOUTH DAILY   nitroGLYCERIN 0.4 MG SL tablet Commonly known as: NITROSTAT Place 1 tablet (0.4 mg total) under the tongue every 5 (five) minutes as needed for chest pain. Started by: Kathlene November, MD   NON FORMULARY Take 1-2 packets by mouth See admin instructions. Lypo-Spheric vitamin C gel packets- Mix 1-2 packets of gel into juice one to two times a day and drink   NON FORMULARY Take 10 mg by mouth See admin instructions. Jarrow Formulas Lyco-Sorb (for Prostate & Cardiovascular Health) 10 mg capsules: Take 10 mg by mouth two times a day   OCUVITE PRESERVISION PO Take 1 capsule by mouth 2 (two) times daily.   omeprazole 40 MG capsule Commonly known as: PRILOSEC Take 1 capsule (40 mg total) by mouth daily.   rosuvastatin 40 MG tablet Commonly known as: CRESTOR Take 1 tablet (40 mg total) by mouth daily. What changed: how much to take   Vitamin D (Cholecalciferol) 25 MCG (1000 UT) Tabs Take 1 tablet by mouth daily.           Objective:   Physical  Exam BP 118/64 (BP Location: Left Arm, Patient Position: Sitting, Cuff Size: Small)   Pulse (!) 56   Temp 97.6 F (36.4 C) (Oral)   Resp 18   Ht 5\' 10"  (1.778 m)   Wt 171 lb 2 oz (77.6 kg)  SpO2 96%   BMI 24.55 kg/m  General: Well developed, NAD, BMI noted Neck: No  thyromegaly  HEENT:  Normocephalic . Face symmetric, atraumatic Lungs:  CTA B Normal respiratory effort, no intercostal retractions, no accessory muscle use. Heart: RRR,  no murmur.  Abdomen:  Not distended, soft, non-tender. No rebound or rigidity.   Lower extremities: no pretibial edema bilaterally  Skin: Exposed areas without rash. Not pale. Not jaundice Neurologic:  alert & oriented X3.  Speech normal, gait appropriate for age and unassisted Strength symmetric and appropriate for age.  Psych: Cognition and judgment appear intact.  Cooperative with normal attention span and concentration.  Behavior appropriate. No anxious or depressed appearing.     Assessment    Assessment Prediabetes HTN Hyperlipidemia, statin intolerant (simva-pravachol: shoulder pain) Anxiety- rarely takes xanax Neuro  -- Trigeminal neuralgia  dx 2015, no w/u, sx resurface x1 after temporary d/c of meds  --saw neuro again 02/2017, labs (-), had a NCS, Rx gaba -- strokes CV: --Mitral valve annuloplasty 2006-- needs SBE prophylaxis -- SOB 04-2016, seen at [facility], had a ECHO Nl fx MV, cath: no CAD --Cryptogenic stroke 09/12/2016, had slurred speech. 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 [name] Colon cancer partial colectomy 1990, colonoscopy 08-2007 negative, Cscope again 09-2012 normal, 5 years  SCC . MOHS 06/2017  PLAN: Here for CPX, other problems addressed Chest pain: New symptom, history of MV  annuloplasty, no new murmur, no edema, no syncope Cardiac cath 2018 showing no CAD however chest pain is exertional and decreases with rest. EKG today: Sinus bradycardia, no acute changes Plan: Refer to cardiology, NTG, discussed how to take it, ER if severe sxs. HTN: Since the last visit, ambulatory BPs are very good the great majority of the time.  BP today 118/64.  No change Hyperlipidemia: On rosuvastatin 40 mg half tablet, labs. Anxiety: Rarely takes Xanax.  No UDS today Neuro: CVA, trigeminal neuralgia:  Saw neurology 12/10/2020, no changes RTC 5-6 m    This visit occurred during the SARS-CoV-2 public health emergency.  Safety protocols were in place, including screening questions prior to the visit, additional usage of staff PPE, and extensive cleaning of exam room while observing appropriate contact time as indicated for disinfecting solutions.

## 2020-12-12 NOTE — Telephone Encounter (Signed)
Pt to see Dr. Harriet Masson the DOD Wed 12/17/20.

## 2020-12-12 NOTE — Telephone Encounter (Signed)
Pt saw Dr. [name] his PCP today and he reported having "light chest pressure" at rest and with exertion... he is concerned that it is associated with his mitral valve... he has a h/o repair but he denies dyspnea and palpitations.. he is overdue to see Dr. Debara Pickett... he says Dr. [name] urged hi  to make an appt soon to see him.   I will have to forward to Dr. Debara Pickett his nurse for review and possibly an appt in the next several days.   Pt says that Dr. [name] gave him prn nitro and we went over the use instructions.   I have also urged him to consider going to the ED if his pain returns or worsens.   Dr. [name] note from 12/12/20:  Chest pain: New symptom, history of MV annuloplasty, no new murmur, no edema, no syncope Cardiac cath 2018 showing no CAD however chest pain is exertional and decreases with rest. EKG today: Sinus bradycardia, no acute changes Plan: Refer to cardiology, NTG, discussed how to take it, ER if severe sxs.   EKG in chart for Dr. Lysbeth Penner review and recommendations.

## 2020-12-12 NOTE — Telephone Encounter (Signed)
Pt c/o of Chest Pain: STAT if CP now or developed within 24 hours  1. Are you having CP right now? Yes, but light  2. Are you experiencing any other symptoms (ex. SOB, nausea, vomiting, sweating)? no  3. How long have you been experiencing CP? For a couple weeks  4. Is your CP continuous or coming and going? Continuous   5. Have you taken Nitroglycerin? no   Patient states he has been having a light pain and discomfort in his chest for a couple weeks. He says he does have it now and yesterday after walking he felt it more. He says it is on the left side of his chest and is concerned it may be his mitral valve. He says he say his PCP and was prescribed nitroglycerin, but states he has not taken it.   ?

## 2020-12-12 NOTE — Patient Instructions (Signed)
Continue checking your blood pressure BP GOAL is between 110/65 and  135/85. If it is consistently higher or lower, let me know  For chest pain: Rest for the next few days, we are referring you to cardiology If you have chest pain, take Nitroglycerin every 5 minutes x 3. If you have severe or persistent chest pain: Call [facility] : Get the blood work     [place], [place] back for a checkup in 5 to 6 months     "Living will", "[employer] of attorney": Advanced care planning  (If you already have a living will or healthcare power of attorney, please bring the copy to be scanned in your chart.)  Advance care planning is a process that supports adults in  understanding and sharing their preferences regarding future medical care.   The patient's preferences are recorded in documents called Advance Directives.    Advanced directives are completed (and can be modified at any time) while the patient is in full mental capacity.   The documentation should be available at all times to the patient, the family and the healthcare providers.  Bring in a copy to be scanned in your chart is an excellent idea and is recommended   This legal documents direct treatment decision making and/or appoint a surrogate to make the decision if the patient is not capable to do so.    Advance directives can be documented in many types of formats,  documents have names such as:  Lliving will  Durable power of attorney for healthcare (healthcare proxy or healthcare power of attorney)  Combined directives  Physician orders for life-sustaining treatment    More information at:  [email or web address]

## 2020-12-13 LAB — LIPID PANEL
Cholesterol: 116 mg/dL (ref <200)
HDL: 42 mg/dL (ref >=40)
LDL Cholesterol (Calc): 57 mg/dL (calc)
Non-HDL Cholesterol (Calc): 74 mg/dL (calc) (ref <130)
Total CHOL/HDL Ratio: 2.8 (calc) (ref <5.0)
Triglycerides: 83 mg/dL (ref <150)

## 2020-12-13 LAB — HEMOGLOBIN A1C
Hgb A1c MFr Bld: 5.4 % of total Hgb (ref <5.7)
Mean Plasma Glucose: 108 mg/dL
eAG (mmol/L): 6 mmol/L

## 2020-12-13 LAB — CBC WITH DIFFERENTIAL/PLATELET
Absolute Monocytes: 488 cells/uL (ref 200–950)
Basophils Absolute: 53 cells/uL (ref 0–200)
Basophils Relative: 0.8 %
Eosinophils Absolute: 112 cells/uL (ref 15–500)
Eosinophils Relative: 1.7 %
HCT: 43.9 % (ref 38.5–50.0)
Hemoglobin: 14.4 g/dL (ref 13.2–17.1)
Lymphs Abs: 2099 cells/uL (ref 850–3900)
MCH: 28.1 pg (ref 27.0–33.0)
MCHC: 32.8 g/dL (ref 32.0–36.0)
MCV: 85.7 fL (ref 80.0–100.0)
MPV: 10.4 fL (ref 7.5–12.5)
Monocytes Relative: 7.4 %
Neutro Abs: 3848 cells/uL (ref 1500–7800)
Neutrophils Relative %: 58.3 %
Platelets: 214 10*3/uL (ref 140–400)
RBC: 5.12 10*6/uL (ref 4.20–5.80)
RDW: 12.5 % (ref 11.0–15.0)
Total Lymphocyte: 31.8 %
WBC: 6.6 10*3/uL (ref 3.8–10.8)

## 2020-12-13 LAB — AST: AST: 19 U/L (ref 10–35)

## 2020-12-13 LAB — ALT: ALT: 16 U/L (ref 9–46)

## 2020-12-14 ENCOUNTER — Encounter: Payer: Self-pay | Admitting: Internal Medicine

## 2020-12-14 NOTE — Assessment & Plan Note (Signed)
Here for CPX, other problems addressed Chest pain: New symptom, history of MV annuloplasty, no new murmur, no edema, no syncope Cardiac cath 2018 showing no CAD however chest pain is exertional and decreases with rest. EKG today: Sinus bradycardia, no acute changes Plan: Refer to cardiology, NTG, discussed how to take it, ER if severe sxs. HTN: Since the last visit, ambulatory BPs are very good the great majority of the time.  BP today 118/64.  No change Hyperlipidemia: On rosuvastatin 40 mg half tablet, labs. Anxiety: Rarely takes Xanax.  No UDS today Neuro: CVA, trigeminal neuralgia:  Saw neurology 12/10/2020, no changes RTC 5-6 m

## 2020-12-14 NOTE — Assessment & Plan Note (Signed)
-  Td 2017 per pt - PNM 23: 2006, 2012;  prevnar--2015   - S/p Zostavax per pt , no documentation - S/p shingrix  - s/p covid vax x 3: rec booster, he is hesitant, benefits discussed - flu shot  today -h/o colon ca: Last Cscope-- 09-2012 neg, we discussed the issue again, he is somewhat reluctant, GI letter from 2019 printed for the patient.   -Has a prostate nodule,  see last CPX, no further eval -Labs reviewed: AST, ALT, FLP, CBC, A1c  -POA discussed.

## 2020-12-17 ENCOUNTER — Ambulatory Visit: Payer: PPO | Admitting: Cardiology

## 2020-12-17 ENCOUNTER — Other Ambulatory Visit: Payer: Self-pay

## 2020-12-17 ENCOUNTER — Encounter: Payer: Self-pay | Admitting: Cardiology

## 2020-12-17 VITALS — BP 118/68 | HR 54 | Ht 70.0 in | Wt 172.4 lb

## 2020-12-17 DIAGNOSIS — I480 Paroxysmal atrial fibrillation: Secondary | ICD-10-CM | POA: Diagnosis not present

## 2020-12-17 DIAGNOSIS — G459 Transient cerebral ischemic attack, unspecified: Secondary | ICD-10-CM | POA: Diagnosis not present

## 2020-12-17 DIAGNOSIS — Z7901 Long term (current) use of anticoagulants: Secondary | ICD-10-CM

## 2020-12-17 DIAGNOSIS — E782 Mixed hyperlipidemia: Secondary | ICD-10-CM | POA: Diagnosis not present

## 2020-12-17 DIAGNOSIS — R0789 Other chest pain: Secondary | ICD-10-CM | POA: Diagnosis not present

## 2020-12-17 DIAGNOSIS — I10 Essential (primary) hypertension: Secondary | ICD-10-CM

## 2020-12-17 NOTE — Progress Notes (Signed)
Cardiology Office Note:    Date:  12/17/2020   ID:  Kevin Bass, DOB 15-Aug-1940, MRN [id number]  PCP:  Colon Branch, MD  Cardiologist:  Cristopher Peru, MD  Electrophysiologist:  None   Referring MD: Colon Branch, MD   " I am having doing chest pain"   History of Present Illness:    Kevin Bass is a 80 y.o. male with a hx of paroxysmal atrial fibrillation Eliquis 5 mg twice daily, history of mitral valve repair echocardiogram in 2019 showed trivial mitral regurgitation,Hyperlipidemia, hypertension, cryptogenic stroke with loop recorder placement in 2018.  The patient follows with Dr. Tora Perches and has seen him for many years.  His last visit was May 29, 2019 with Dr. Debara Pickett at that time he has been doing well from a cardiovascular standpoint.  Since his last visit he tells me recently he has been experiencing intermittent chest discomfort.  He described as a left-sided chest pain.  He says sometimes it is like a tightness sensation.  It does not radiate.  It last for few minutes and then resolves.  Most times he notices it when he is walking of the incline.  He denies adamantly any shortness of breath.  He tells me this been happening for couple weeks now especially when he is active.  Nothing makes it better or worse.   Past Medical History:  Diagnosis Date   Allergy    Anxiety    Arthritis    hands   Atrial fibrillation ([place])    Colon cancer ([place])    s/p partial colectomy 1990, Cscope neg 7-09, next 2014   DISORDER, MITRAL VALVE    MV recontruction at [place] 2006----still needs ABX for SBE prophylaxis    Elevated PSA    prostate nodule, [name]   GERD (gastroesophageal reflux disease)    Heart murmur    no problems since surgery   HYPERGLYCEMIA, BORDERLINE 10/16/2008   HYPERLIPIDEMIA 06/15/2006   diet controlled   Hypertension    HYPERTROPHY PROSTATE W/O UR OBST & OTH LUTS 10/16/2008   Neuropathy    Right groin pain    Dr. Redmond Pulling   SCC (squamous cell carcinoma) 06/2017    s/p Mohs---[name]   Stroke [facility])    Wears partial dentures    lower partial    Past Surgical History:  Procedure Laterality Date   COLECTOMY  1990   HERNIA REPAIR Left 2014   [facility] rep w/mesh- March 2014   LOOP RECORDER INSERTION N/A 12/01/2016   Procedure: LOOP RECORDER INSERTION;  Surgeon: Evans Lance, MD;  Location: [place] [facility];  Service: Cardiovascular;  Laterality: N/A;   MITRAL VALVE ANNULOPLASTY     [facility] 2006   TEE WITHOUT CARDIOVERSION N/A 10/05/2016   Procedure: TRANSESOPHAGEAL ECHOCARDIOGRAM (TEE);  Surgeon: [name], MD;  Location[facility] ENDOSCOPY;  Service: Cardiovascular;  Laterality: N/A;    Current Medications: Current Meds  Medication Sig   acetaminophen (TYLENOL) 650 MG CR tablet Take 1,300 mg by mouth every 8 (eight) hours as needed for pain.   ALPRAZolam (XANAX) 0.25 MG tablet Take 1 tablet (0.25 mg total) by mouth 2 (two) times daily as needed for anxiety.   amLODipine (NORVASC) 5 MG tablet Take 1 tablet (5 mg total) by mouth daily.   Arginine 500 MG CAPS Take 1,000 mg by mouth daily.    Baclofen 5 MG TABS 1 tablet three times a day as needed for pain   Coenzyme Q10 100 MG TABS  Take 1 tablet by mouth in the morning and at bedtime.   ELIQUIS 5 MG TABS tablet TAKE 1 TABLET BY MOUTH TWICE DAILY   gabapentin (NEURONTIN) 300 MG capsule Take 1 capsule (300 mg total) by mouth See admin instructions. Take 300 mg by mouth four times a day- morning, 8:30 PM, 9:30 PM, and 10:00 PM   Glucosamine-MSM-Hyaluronic Acd (JOINT HEALTH PO) Take 1 capsule by mouth daily.    ketorolac (ACULAR) 0.5 % ophthalmic solution SMARTSIG:1 Drop(s) Right Eye 4 Times Daily PRN   loratadine (CLARITIN) 10 MG tablet Take 10 mg by mouth at bedtime.   losartan (COZAAR) 100 MG tablet Take 1 tablet (100 mg total) by mouth daily.   metoprolol succinate (TOPROL-XL) 25 MG 24 hr tablet TAKE 3 TABLETS(75 MG) BY MOUTH DAILY   Multiple Vitamins-Minerals (OCUVITE PRESERVISION PO) Take 1  capsule by mouth 2 (two) times daily.    nitroGLYCERIN (NITROSTAT) 0.4 MG SL tablet Place 1 tablet (0.4 mg total) under the tongue every 5 (five) minutes as needed for chest pain.   NON FORMULARY Take 1-2 packets by mouth See admin instructions. Lypo-Spheric vitamin C gel packets- Mix 1-2 packets of gel into juice one to two times a day and drink   NON FORMULARY Take 10 mg by mouth See admin instructions. [employer] Lyco-Sorb (for Prostate & Cardiovascular Health) 10 mg capsules: Take 10 mg by mouth two times a day   Omega-3 Fatty Acids (FISH OIL PO) Take 1 capsule by mouth in the morning and at bedtime.   omeprazole (PRILOSEC) 40 MG capsule Take 1 capsule (40 mg total) by mouth daily.   rosuvastatin (CRESTOR) 40 MG tablet Take 1 tablet (40 mg total) by mouth daily. (Patient taking differently: Take 20 mg by mouth daily.)   Vitamin D, Cholecalciferol, 25 MCG (1000 UT) TABS Take 1 tablet by mouth daily.     Allergies:   Prednisolone acetate and Statins   Social History   Socioeconomic History   Marital status: Widowed    Spouse name: suzanne   Number of children: 2   Years of education: college   Highest education level: Not on file  Occupational History   Occupation: retired   Tobacco Use   Smoking status: Former    Packs/day: 0.50    Years: 20.00    Pack years: 10.00    Types: Cigarettes    Quit date: 02/23/1983    Years since quitting: 37.8   Smokeless tobacco: Never  Vaping Use   Vaping Use: Never used  Substance and Sexual Activity   Alcohol use: Yes    Alcohol/week: 7.0 standard drinks    Types: 7 Glasses of wine per week   Drug use: No   Sexual activity: Not Currently  Other Topics Concern   Not on file  Social History Narrative   From [place], lost wife 08/2017 to cancer   Lives by himself    Daughter in [place], son in [place]   Right handed      Social Determinants of Health   Financial Resource Strain: Low Risk    Difficulty of Paying Living Expenses: Not very  hard  Food Insecurity: Not on file  Transportation Needs: Not on file  Physical Activity: Not on file  Stress: Not on file  Social Connections: Not on file     Family History: The patient's family history includes Colon cancer in an other family member. There is no history of Diabetes, Stroke, CAD, Prostate cancer, Rectal cancer,  or Esophageal cancer.  ROS:   Review of Systems  Constitution: Negative for decreased appetite, fever and weight gain.  HENT: Negative for congestion, ear discharge, hoarse voice and sore throat.   Eyes: Negative for discharge, redness, vision loss in right eye and visual halos.  Cardiovascular: Reports chest pain,.  Negative for dyspnea on exertion, leg swelling, orthopnea and palpitations.  Respiratory: Negative for cough, hemoptysis, shortness of breath and snoring.   Endocrine: Negative for heat intolerance and polyphagia.  Hematologic/Lymphatic: Negative for bleeding problem. Does not bruise/bleed easily.  Skin: Negative for flushing, nail changes, rash and suspicious lesions.  Musculoskeletal: Negative for arthritis, joint pain, muscle cramps, myalgias, neck pain and stiffness.  Gastrointestinal: Negative for abdominal pain, bowel incontinence, diarrhea and excessive appetite.  Genitourinary: Negative for decreased libido, genital sores and incomplete emptying.  Neurological: Negative for brief paralysis, focal weakness, headaches and loss of balance.  Psychiatric/Behavioral: Negative for altered mental status, depression and suicidal ideas.  Allergic/Immunologic: Negative for HIV exposure and persistent infections.    EKGs/Labs/Other Studies Reviewed:    The following studies were reviewed today:   EKG:  The ekg ordered today demonstrates sinus bradycardia, heart rate 84 beats minute with sinus arrhythmia.  Echo 2019 Study Conclusions   - Left ventricle: The cavity size was normal. Wall thickness was    increased in a pattern of mild LVH.  Systolic function was normal.    The estimated ejection fraction was in the range of 55% to 60%.    Wall motion was normal; there were no regional wall motion    abnormalities. Doppler parameters are consistent with abnormal    left ventricular relaxation (grade 1 diastolic dysfunction). The    E/e&' ratio is between 8-15, suggesting indeterminate LV filling    pressure.  - Mitral valve: Prior mitral valve repair. Calcified annulus - no    obvious thrombus or vegetation. Trivial regurgitation.  - Left atrium: The atrium was normal in size.  - Right atrium: Moderately dilated.  - Tricuspid valve: There was trivial regurgitation.  - Pulmonary arteries: PA peak pressure: 22 mm Hg (S).  - Inferior vena cava: The vessel was normal in size. The    respirophasic diameter changes were in the normal range (>= 50%),    consistent with normal central venous pressure.   Impressions:   - Compared to a prior study in 2018, there have been no signficant    changes.   -------------------------------------------------------------------  Study data:  Comparison was made to the study of 12/01/2016.  Study  status:  Routine.  Procedure:  The patient reported no pain pre or  post test. Transthoracic echocardiography. Image quality was  adequate.  Study completion:  There were no complications.  Transthoracic echocardiography.  M-mode, complete 2D, spectral  Doppler, and color Doppler.  Birthdate:  Patient birthdate:  [birth year].  Age:  Patient is 80 yr old.  Sex:  Gender: male.  BMI: 26.4 kg/m^2.  Blood pressure:     132/66  Patient status:  Inpatient.  Study date:  Study date: 12/03/2017. Study time: 10:22  AM.  Location:  Echo laboratory.   -------------------------------------------------------------------   -------------------------------------------------------------------  Left ventricle:  The cavity size was normal. Wall thickness was  increased in a pattern of mild LVH. Systolic function was  normal.  The estimated ejection fraction was in the range of 55% to 60%.  Wall motion was normal; there were no regional wall motion  abnormalities. Doppler parameters are consistent with abnormal left  ventricular  relaxation (grade 1 diastolic dysfunction). The E/e&'  ratio is between 8-15, suggesting indeterminate LV filling  pressure.   -------------------------------------------------------------------  Aortic valve:   Structurally normal valve. Trileaflet. Cusp  separation was normal.  Doppler:  Transvalvular velocity was within  the normal range. There was no stenosis. There was no  regurgitation.   -------------------------------------------------------------------  Aorta:  Aortic root: The aortic root was normal in size.  Ascending aorta: The ascending aorta was normal in size.   -------------------------------------------------------------------  Mitral valve:  Prior mitral valve repair. Calcified annulus - no  obvious thrombus or vegetation. Trivial regurgitation.  Doppler:   Peak gradient (D): 8 mm Hg.   -------------------------------------------------------------------  Left atrium:  The atrium was normal in size.   -------------------------------------------------------------------  Right ventricle:  The cavity size was normal. Wall thickness was  normal. Systolic function was normal.   -------------------------------------------------------------------  Pulmonic valve:    Structurally normal valve. The valve appears to  be grossly normal.   Cusp separation was normal.  Doppler:  Transvalvular velocity was within the normal range. There was no  regurgitation.   -------------------------------------------------------------------  Tricuspid valve:   Doppler:  There was trivial regurgitation.   -------------------------------------------------------------------  Pulmonary artery:   The main pulmonary artery was normal-sized.    -------------------------------------------------------------------  Right atrium:  Moderately dilated.   -------------------------------------------------------------------  Pericardium:  There was no pericardial effusion.   -------------------------------------------------------------------  Systemic veins:  Inferior vena cava: The vessel was normal in size. The  respirophasic diameter changes were in the normal range (>= 50%),  consistent with normal central venous pressure.   -------------------------------------------------------------------  Recent Labs: 06/11/2020: TSH 2.19 11/21/2020: BUN 18; Creat 1.04; Potassium 4.5; Sodium 142 12/12/2020: ALT 16; Hemoglobin 14.4; Platelets 214  Recent Lipid Panel    Component Value Date/Time   CHOL 116 12/12/2020 1340   TRIG 83 12/12/2020 1340   HDL 42 12/12/2020 1340   CHOLHDL 2.8 12/12/2020 1340   VLDL 25.4 12/11/2018 1101   LDLCALC 57 12/12/2020 1340   LDLDIRECT 149.1 08/30/2012 1352    Physical Exam:    VS:  BP 118/68   Pulse (!) 54   Ht 5\' 10"  (1.778 m)   Wt 172 lb 6.4 oz (78.2 kg)   SpO2 99%   BMI 24.74 kg/m     Wt Readings from Last 3 Encounters:  12/17/20 172 lb 6.4 oz (78.2 kg)  12/12/20 171 lb 2 oz (77.6 kg)  12/10/20 175 lb 6.4 oz (79.6 kg)     GEN: Well nourished, well developed in no acute distress HEENT: Normal NECK: No JVD; No carotid bruits LYMPHATICS: No lymphadenopathy CARDIAC: S1S2 noted,RRR, no murmurs, rubs, gallops RESPIRATORY:  Clear to auscultation without rales, wheezing or rhonchi  ABDOMEN: Soft, non-tender, non-distended, +bowel sounds, no guarding. EXTREMITIES: No edema, No cyanosis, no clubbing MUSCULOSKELETAL:  No deformity  SKIN: Warm and dry NEUROLOGIC:  Alert and oriented x 3, non-focal PSYCHIATRIC:  Normal affect, good insight  ASSESSMENT:    1. Other chest pain   2. Hypertension, unspecified type   3. Paroxysmal atrial fibrillation (HCC)   4. TIA (transient ischemic  attack)   5. Mixed hyperlipidemia   6. Chronic anticoagulation    PLAN:    His chest pain is concerning patient does have risk factor for coronary artery disease have discussed with him that it would be beneficial to get a functional testing for ischemia.  We talked about Lexiscan.  He is apprehensive and may want to speak with his primary cardiologist about  this before he along with it.  In the meantime nitroglycerin sublingual has been given to the patient and patient has been educated on how to use this medication.  I reviewed his echocardiogram from 2019 with trivial MR.  He has no signs of heart failure and denies adamantly any shortness of breath therefore for now holding off on ordering any echocardiogram.  The patient is in agreement with the above plan. The patient left the office in stable condition.  The patient will follow up in 6 months with Dr. Debara Pickett.  Medication Adjustments/Labs and Tests Ordered: Current medicines are reviewed at length with the patient today.  Concerns regarding medicines are outlined above.  Orders Placed This Encounter  Procedures   MYOCARDIAL PERFUSION IMAGING   EKG 12-Lead   No orders of the defined types were placed in this encounter.   Patient Instructions  Medication Instructions:  Your physician recommends that you continue on your current medications as directed. Please refer to the Current Medication list given to you today.  *If you need a refill on your cardiac medications before your next appointment, please call your pharmacy*   Lab Work: None If you have labs (blood work) drawn today and your tests are completely normal, you will receive your results only by: [facility] (if you have MyChart) OR A paper copy in the mail If you have any lab test that is abnormal or we need to change your treatment, we will call you to review the results.   Testing/Procedures: Your physician has requested that you have a lexiscan myoview. For  further information please visit [email or web address]. Please follow instruction sheet, as given.   The test will take approximately 3 to 4 hours to complete; you may bring reading material.  If someone comes with you to your appointment, they will need to remain in the main lobby due to limited space in the testing area. **If you are pregnant or breastfeeding, please notify the nuclear lab prior to your appointment**  How to prepare for your Myocardial Perfusion Test: Do not eat or drink 3 hours prior to your test, except you may have water. Do not consume products containing caffeine (regular or decaffeinated) 12 hours prior to your test. (ex: coffee, chocolate, sodas, tea). Do bring a list of your current medications with you.  If not listed below, you may take your medications as normal. Do wear comfortable clothes (no dresses or overalls) and walking shoes, tennis shoes preferred (No heels or open toe shoes are allowed). Do NOT wear cologne, perfume, aftershave, or lotions (deodorant is allowed). If these instructions are not followed, your test will have to be rescheduled.    Follow-Up: At [facility], you and your health needs are our priority.  As part of our continuing mission to provide you with exceptional heart care, we have created designated Provider Care Teams.  These Care Teams include your primary Cardiologist (physician) and Advanced Practice Providers (APPs -  Physician Assistants and Nurse Practitioners) who all work together to provide you with the care you need, when you need it.  We recommend signing up for the patient portal called "MyChart".  Sign up information is provided on this After Visit Summary.  MyChart is used to connect with patients for Virtual Visits (Telemedicine).  Patients are able to view lab/test results, encounter notes, upcoming appointments, etc.  Non-urgent messages can be sent to your provider as well.   To learn more about what you can do with  MyChart, go  to [email or web address].    Your next appointment:   6 month(s)  The format for your next appointment:   In Person  Provider:   Raliegh Ip Mali Hilty, MD   Other Instructions  Cardiac Nuclear Scan A cardiac nuclear scan is a test that is done to check the flow of blood to your heart. It is done when you are resting and when you are exercising. The test looks for problems such as: Not enough blood reaching a portion of the heart. The heart muscle not working as it should. You may need this test if: You have heart disease. You have had lab results that are not normal. You have had heart surgery or a balloon procedure to open up blocked arteries (angioplasty). You have chest pain. You have shortness of breath. In this test, a special dye (tracer) is put into your bloodstream. The tracer will travel to your heart. A camera will then take pictures of your heart to see how the tracer moves through your heart. This test is usually done at a hospital and takes 2-4 hours. Tell a doctor about: Any allergies you have. All medicines you are taking, including vitamins, herbs, eye drops, creams, and over-the-counter medicines. Any problems you or family members have had with anesthetic medicines. Any blood disorders you have. Any surgeries you have had. Any medical conditions you have. Whether you are pregnant or may be pregnant. What are the risks? Generally, this is a safe test. However, problems may occur, such as: Serious chest pain and heart attack. This is only a risk if the stress portion of the test is done. Rapid heartbeat. A feeling of warmth in your chest. This feeling usually does not last long. Allergic reaction to the tracer. What happens before the test? Ask your doctor about changing or stopping your normal medicines. This is important. Follow instructions from your doctor about what you cannot eat or drink. Remove your jewelry on the day of the test. What happens  during the test? An IV tube will be inserted into one of your veins. Your doctor will give you a small amount of tracer through the IV tube. You will wait for 20-40 minutes while the tracer moves through your bloodstream. Your heart will be monitored with an electrocardiogram (ECG). You will lie down on an exam table. Pictures of your heart will be taken for about 15-20 minutes. You may also have a stress test. For this test, one of these things may be done: You will be asked to exercise on a treadmill or a stationary bike. You will be given medicines that will make your heart work harder. This is done if you are unable to exercise. When blood flow to your heart has peaked, a tracer will again be given through the IV tube. After 20-40 minutes, you will get back on the exam table. More pictures will be taken of your heart. Depending on the tracer that is used, more pictures may need to be taken 3-4 hours later. Your IV tube will be removed when the test is over. The test may vary among doctors and hospitals. What happens after the test? Ask your doctor: Whether you can return to your normal schedule, including diet, activities, and medicines. Whether you should drink more fluids. This will help to remove the tracer from your body. Drink enough fluid to keep your pee (urine) pale yellow. Ask your doctor, or the department that is doing the test: When will my results be ready? How will  I get my results? Summary A cardiac nuclear scan is a test that is done to check the flow of blood to your heart. Tell your doctor whether you are pregnant or may be pregnant. Before the test, ask your doctor about changing or stopping your normal medicines. This is important. Ask your doctor whether you can return to your normal activities. You may be asked to drink more fluids. This information is not intended to replace advice given to you by your health care provider. Make sure you discuss any questions you  have with your health care provider. Document Revised: 05/31/2018 Document Reviewed: 07/25/2017 Elsevier Patient Education  2022 [place].   Adopting a Healthy Lifestyle.  Know what a healthy weight is for you (roughly BMI <25) and aim to maintain this   Aim for 7+ servings of fruits and vegetables daily   65-80+ fluid ounces of water or unsweet tea for healthy kidneys   Limit to max 1 drink of alcohol per day; avoid smoking/tobacco   Limit animal fats in diet for cholesterol and heart health - choose grass fed whenever available   Avoid highly processed foods, and foods high in saturated/trans fats   Aim for low stress - take time to unwind and care for your mental health   Aim for 150 min of moderate intensity exercise weekly for heart health, and weights twice weekly for bone health   Aim for 7-9 hours of sleep daily   When it comes to diets, agreement about the perfect plan isnt easy to find, even among the experts. Experts at the [employer] developed an idea known as the Healthy Eating Plate. Just imagine a plate divided into logical, healthy portions.   The emphasis is on diet quality:   Load up on vegetables and fruits - one-half of your plate: Aim for color and variety, and remember that potatoes dont count.   Go for whole grains - one-quarter of your plate: Whole wheat, barley, wheat berries, quinoa, oats, brown rice, and foods made with them. If you want pasta, go with whole wheat pasta.   Protein power - one-quarter of your plate: Fish, chicken, beans, and nuts are all healthy, versatile protein sources. Limit red meat.   The diet, however, does go beyond the plate, offering a few other suggestions.   Use healthy plant oils, such as olive, canola, soy, corn, sunflower and peanut. Check the labels, and avoid partially hydrogenated oil, which have unhealthy trans fats.   If youre thirsty, drink water. Coffee and tea are good in moderation,  but skip sugary drinks and limit milk and dairy products to one or two daily servings.   The type of carbohydrate in the diet is more important than the amount. Some sources of carbohydrates, such as vegetables, fruits, whole grains, and beans-are healthier than others.   Finally, stay active  Signed, Berniece Salines, DO  12/17/2020 4:28 PM    [facility]

## 2020-12-17 NOTE — Patient Instructions (Addendum)
Medication Instructions:  Your physician recommends that you continue on your current medications as directed. Please refer to the Current Medication list given to you today.  *If you need a refill on your cardiac medications before your next appointment, please call your pharmacy*   Lab Work: None If you have labs (blood work) drawn today and your tests are completely normal, you will receive your results only by: [place] (if you have MyChart) OR A paper copy in the mail If you have any lab test that is abnormal or we need to change your treatment, we will call you to review the results.   Testing/Procedures: Your physician has requested that you have a lexiscan myoview. For further information please visit [email or web address]. Please follow instruction sheet, as given.   The test will take approximately 3 to 4 hours to complete; you may bring reading material.  If someone comes with you to your appointment, they will need to remain in the main lobby due to limited space in the testing area. **If you are pregnant or breastfeeding, please notify the nuclear lab prior to your appointment**  How to prepare for your Myocardial Perfusion Test: Do not eat or drink 3 hours prior to your test, except you may have water. Do not consume products containing caffeine (regular or decaffeinated) 12 hours prior to your test. (ex: coffee, chocolate, sodas, tea). Do bring a list of your current medications with you.  If not listed below, you may take your medications as normal. Do wear comfortable clothes (no dresses or overalls) and walking shoes, tennis shoes preferred (No heels or open toe shoes are allowed). Do NOT wear cologne, perfume, aftershave, or lotions (deodorant is allowed). If these instructions are not followed, your test will have to be rescheduled.    Follow-Up: At [facility], you and your health needs are our priority.  As part of our continuing mission to provide you with  exceptional heart care, we have created designated Provider Care Teams.  These Care Teams include your primary Cardiologist (physician) and Advanced Practice Providers (APPs -  Physician Assistants and Nurse Practitioners) who all work together to provide you with the care you need, when you need it.  We recommend signing up for the patient portal called "MyChart".  Sign up information is provided on this After Visit Summary.  MyChart is used to connect with patients for Virtual Visits (Telemedicine).  Patients are able to view lab/test results, encounter notes, upcoming appointments, etc.  Non-urgent messages can be sent to your provider as well.   To learn more about what you can do with MyChart, go to [email or web address].    Your next appointment:   6 month(s)  The format for your next appointment:   In Person  Provider:   Raliegh Ip Mali Hilty, MD   Other Instructions  Cardiac Nuclear Scan A cardiac nuclear scan is a test that is done to check the flow of blood to your heart. It is done when you are resting and when you are exercising. The test looks for problems such as: Not enough blood reaching a portion of the heart. The heart muscle not working as it should. You may need this test if: You have heart disease. You have had lab results that are not normal. You have had heart surgery or a balloon procedure to open up blocked arteries (angioplasty). You have chest pain. You have shortness of breath. In this test, a special dye (tracer) is put into your bloodstream. The tracer  will travel to your heart. A camera will then take pictures of your heart to see how the tracer moves through your heart. This test is usually done at a hospital and takes 2-4 hours. Tell a doctor about: Any allergies you have. All medicines you are taking, including vitamins, herbs, eye drops, creams, and over-the-counter medicines. Any problems you or family members have had with anesthetic medicines. Any blood  disorders you have. Any surgeries you have had. Any medical conditions you have. Whether you are pregnant or may be pregnant. What are the risks? Generally, this is a safe test. However, problems may occur, such as: Serious chest pain and heart attack. This is only a risk if the stress portion of the test is done. Rapid heartbeat. A feeling of warmth in your chest. This feeling usually does not last long. Allergic reaction to the tracer. What happens before the test? Ask your doctor about changing or stopping your normal medicines. This is important. Follow instructions from your doctor about what you cannot eat or drink. Remove your jewelry on the day of the test. What happens during the test? An IV tube will be inserted into one of your veins. Your doctor will give you a small amount of tracer through the IV tube. You will wait for 20-40 minutes while the tracer moves through your bloodstream. Your heart will be monitored with an electrocardiogram (ECG). You will lie down on an exam table. Pictures of your heart will be taken for about 15-20 minutes. You may also have a stress test. For this test, one of these things may be done: You will be asked to exercise on a treadmill or a stationary bike. You will be given medicines that will make your heart work harder. This is done if you are unable to exercise. When blood flow to your heart has peaked, a tracer will again be given through the IV tube. After 20-40 minutes, you will get back on the exam table. More pictures will be taken of your heart. Depending on the tracer that is used, more pictures may need to be taken 3-4 hours later. Your IV tube will be removed when the test is over. The test may vary among doctors and hospitals. What happens after the test? Ask your doctor: Whether you can return to your normal schedule, including diet, activities, and medicines. Whether you should drink more fluids. This will help to remove the  tracer from your body. Drink enough fluid to keep your pee (urine) pale yellow. Ask your doctor, or the department that is doing the test: When will my results be ready? How will I get my results? Summary A cardiac nuclear scan is a test that is done to check the flow of blood to your heart. Tell your doctor whether you are pregnant or may be pregnant. Before the test, ask your doctor about changing or stopping your normal medicines. This is important. Ask your doctor whether you can return to your normal activities. You may be asked to drink more fluids. This information is not intended to replace advice given to you by your health care provider. Make sure you discuss any questions you have with your health care provider. Document Revised: 05/31/2018 Document Reviewed: 07/25/2017 Elsevier Patient Education  [place].

## 2020-12-24 ENCOUNTER — Telehealth (HOSPITAL_COMMUNITY): Payer: Self-pay | Admitting: *Deleted

## 2020-12-24 ENCOUNTER — Telehealth: Payer: Self-pay | Admitting: Cardiology

## 2020-12-24 NOTE — Telephone Encounter (Signed)
Close encounter 

## 2020-12-24 NOTE — Telephone Encounter (Signed)
Kevin Bass is calling requesting his Lexiscan instructions for tomorrow.

## 2020-12-25 ENCOUNTER — Other Ambulatory Visit: Payer: Self-pay | Admitting: Internal Medicine

## 2020-12-25 ENCOUNTER — Ambulatory Visit (HOSPITAL_COMMUNITY)
Admission: RE | Admit: 2020-12-25 | Discharge: 2020-12-25 | Disposition: A | Discharge status: Home / Self Care | Payer: PPO | Source: Ambulatory Visit | Attending: Cardiovascular Disease | Admitting: Cardiovascular Disease

## 2020-12-25 ENCOUNTER — Other Ambulatory Visit: Payer: Self-pay

## 2020-12-25 DIAGNOSIS — R0789 Other chest pain: Secondary | ICD-10-CM | POA: Insufficient documentation

## 2020-12-25 LAB — MYOCARDIAL PERFUSION IMAGING
LV dias vol: 114 mL (ref 62–150)
LV sys vol: 51 mL
Nuc Stress EF: 55 %
Peak HR: 72 {beats}/min
Rest HR: 56 {beats}/min
Rest Nuclear Isotope Dose: 10.9 mCi
SDS: 1
SRS: 2
SSS: 3
ST Depression (mm): 0 mm
Stress Nuclear Isotope Dose: 31.2 mCi
TID: 0.94

## 2020-12-25 MED ORDER — REGADENOSON 0.4 MG/5ML IV SOLN
0.4000 mg | Freq: Once | INTRAVENOUS | Status: AC
  Administered 2020-12-25: 0.4 mg via INTRAVENOUS

## 2020-12-25 MED ORDER — TECHNETIUM TC 99M TETROFOSMIN IV KIT
10.9000 | PACK | Freq: Once | INTRAVENOUS | Status: AC | PRN
  Administered 2020-12-25: 10.9 via INTRAVENOUS
  Filled 2020-12-25: qty 11

## 2020-12-25 MED ORDER — TECHNETIUM TC 99M TETROFOSMIN IV KIT
31.2000 | PACK | Freq: Once | INTRAVENOUS | Status: AC | PRN
  Administered 2020-12-25: 31.2 via INTRAVENOUS
  Filled 2020-12-25: qty 32

## 2020-12-25 NOTE — Telephone Encounter (Signed)
Called pt, he has already spoken to another staff member about this matter.

## 2020-12-27 ENCOUNTER — Other Ambulatory Visit: Payer: Self-pay | Admitting: Internal Medicine

## 2021-01-01 DIAGNOSIS — I10 Essential (primary) hypertension: Secondary | ICD-10-CM | POA: Diagnosis not present

## 2021-01-01 DIAGNOSIS — I208 Other forms of angina pectoris: Secondary | ICD-10-CM | POA: Diagnosis not present

## 2021-01-01 DIAGNOSIS — G508 Other disorders of trigeminal nerve: Secondary | ICD-10-CM | POA: Diagnosis not present

## 2021-01-06 ENCOUNTER — Ambulatory Visit (INDEPENDENT_AMBULATORY_CARE_PROVIDER_SITE_OTHER): Payer: PPO | Admitting: Pharmacist

## 2021-01-06 DIAGNOSIS — I4891 Unspecified atrial fibrillation: Secondary | ICD-10-CM

## 2021-01-06 DIAGNOSIS — E785 Hyperlipidemia, unspecified: Secondary | ICD-10-CM

## 2021-01-06 DIAGNOSIS — Z79899 Other long term (current) drug therapy: Secondary | ICD-10-CM

## 2021-01-06 DIAGNOSIS — I10 Essential (primary) hypertension: Secondary | ICD-10-CM

## 2021-01-06 NOTE — Patient Instructions (Signed)
Visit Information  Hypertension BP Readings from Last 3 Encounters:  12/17/20 118/68  12/12/20 118/64  12/10/20 117/65   Pharmacist Clinical Goal(s): Over the next 180 days, patient will work with PharmD and providers to maintain BP goal <130/80 Current regimen:  Losartan 100mg  - take 1 tablet daily in morning Metoprolol succinate 25mg  daily - take 2 tablets = 75mg  daily in morning Amlodipine 5mg  - take 1 tablet daily in evening Interventions: Discussed blood pressure goal Patient self care activities - Over the next 180 days, patient will: Check blood pressure 2 to 3 times per week, document, and provide at future appointments Ensure daily salt intake < 2300 mg/day  Hyperlipidemia Lab Results  Component Value Date   [facility] 57 12/12/2020   Pharmacist Clinical Goal(s): Over the next 180 days, patient will work with PharmD and providers to maintain LDL goal < 70 Current regimen:  Rosuvastatin 40mg  daily  Omega 3 - twice daily CoEnzyme Q10 100mg  twice a day Interventions: Discussed LDL goal  Patient self care activities - Over the next 180 days, patient will: Continue to take rosuvastatin 40mg   daily  Atrial Fibrillation:  Pharmacist Clinical Goal(s): Over the next 180 days, patient will work with PharmD and providers to prevent stroke Current regimen:  Metoprolol succinate ER 25mg  - take 3 tablets = 75mg  daily each morning Eliquis 5mg  - take 1 tablet twice a day Interventions: Reviewed Eliquis dosing - no need for dose adjustment Requested updated 2022 medication cost report from [facility]. Will submit to Eliquis patient assistance program.  Patient self care activities - Over the next 180 days, patient will: Continue to current regimen for atrial fibrillation and stroke prevention   Medication management Pharmacist Clinical Goal(s): Over the next 180 days, patient will work with PharmD and providers to maintain optimal medication adherence Current pharmacy:  [facility] Interventions Comprehensive medication review performed. Continue current medication management strategy Patient self care activities - Over the next 180 days, patient will: Focus on medication adherence by filling and taking medications appropriately  Take medications as prescribed Report any questions or concerns to PharmD and/or provider(s) - especially if you feel you cannot continue to take rosuvastatin  Patient verbalizes understanding of instructions provided today and agrees to view in [place].   Telephone follow up appointment with care management team member scheduled for: 3 months  [name], PharmD Clinical Pharmacist [facility]

## 2021-01-06 NOTE — Chronic Care Management (AMB) (Signed)
Chronic Care Management Pharmacy Note  01/06/2021 Name:  Kevin Bass MRN:  [id number] DOB:  03-Jun-1940  Subjective: Kevin Bass is an 80 y.o. year old male who is a primary patient of [name], Alda Berthold, MD.  The CCM team was consulted for assistance with disease management and care coordination needs.    Engaged with patient by telephone for follow up visit in response to provider referral for pharmacy case management and/or care coordination services.   Consent to Services:  The patient was given information about Chronic Care Management services, agreed to services, and gave verbal consent prior to initiation of services.  Please see initial visit note for detailed documentation.   Patient Care Team: Colon Branch, MD as PCP - General Lovena Le Champ Mungo, MD as PCP - Cardiology (Cardiology) Raynelle Bring, MD as Consulting Physician (Urology) Valaria Good, MD as Referring Physician (Cardiology) Antionette Fairy Isaias Cowman, MD as Consulting Physician (Ophthalmology) Cameron Sprang, MD as Consulting Physician (Neurology) [name], RPH-CPP (Pharmacist)  Recent office visits: 12/12/2020 - Internal Med (Dr [name]) Complete Physical. Patient reported some chest pain. EKG checked. Referred to cardiology. Prescribed NTG as ndded 11/21/2020 - Internal Med (Dr [name]) F/U HTN. Continued losartan 158m each morning, metoprolol succinate 77meach morning. Restart amlodipine 2m21mn the afternoon.  11/20/2020 - Fam Med (Dr WenNani RavensDr PazLarose Kellshone Call - re BP. BP was 165/90 since recent med changes. Recommended take amlodipine 2mg70mr next 2 days and follow up with PCP.  11/18/2020 - Fam Med (Dr WendNani Ravensen for HTN. Stopped amlodipine; Increased losartan to 100mg13mly and increased metoprolol ER to 72mg 72my 08/11/2020 - My Chart message - low BP readings. Dr [name] reLarose Kellsmended holding amlodipine.  08/08/2020 - PCP (Dr [name]) -Larose Kellscreased losartan to 50mg o7m2 of 100mg ta22m; Decreased  metoprolol from 3 tabs to 2.5tabs = 62.2mg dail68m  Recent consult visits: 12/25/2020 - Cardio Stress test completed. Noted that results showed low risk 12/17/2020 - Cardio (Dr Tobb) SeeHarriet Massonr chest pain. Ordered stress test. Continue NTG as needed.  12/10/2020 - Neuro (Dr Aquino) FDelice Leschgeminal neuralgia. Continue current medications/ Follow up in 8 mos.  10/13/2020 - Cardio (Dr Hilty) PhDebara Pickettall - has not met 3% OOP spend for Eliquis PAP.  06/03/2020 - Neuro (Dr Aquino) -Delice Leschrigeminal neuralgia and recurrent strokes. No medication changes noted - continue baclofen and gabapentin for trigeminal neuralgia and Eliquis for recurrent stroke  05/07/2020 - [facility] in right eye - can increase IOP;  Start ketorolac as needed QID OD for pain - safe to use long term. Continue lubricating drops on regular basis  Hospital visits: None in previous 6 months  Objective:  Lab Results  Component Value Date   CREATININE 1.04 11/21/2020   CREATININE 1.08 06/11/2020   CREATININE 1.08 12/12/2019    Lab Results  Component Value Date   HGBA1C 5.4 12/12/2020   Last diabetic Eye exam: No results found for: HMDIABEYEEXA  Last diabetic Foot exam: No results found for: HMDIABFOOTEX      Component Value Date/Time   CHOL 116 12/12/2020 1340   TRIG 83 12/12/2020 1340   HDL 42 12/12/2020 1340   CHOLHDL 2.8 12/12/2020 1340   VLDL 25.4 12/11/2018 1101   LDLCALC 57 12/12/2020 1340   LDLDIRECT 149.1 08/30/2012 1352    Hepatic Function Latest Ref Rng & Units 12/12/2020 08/30/2019 08/09/2018  Total Protein 6.5 - 8.1 g/dL - 7.2  6.2  Albumin 3.5 - 5.0 g/dL - 4.1 4.0  AST 10 - 35 U/L _0 ALT 9 - 46 U/L _1 Alk Phosphatase 38 - 126 U/L - 47 51  Total Bilirubin 0.3 - 1.2 mg/dL - 1.0 0.5    Lab Results  Component Value Date/Time   TSH 2.19 06/11/2020 01:51 PM   TSH 1.850 12/03/2017 06:26 AM   TSH 1.88 02/25/2017 03:57 PM    CBC Latest Ref  Rng & Units 12/12/2020 06/11/2020 12/12/2019  WBC 3.8 - 10.8 Thousand/uL 6.6 6.1 5.8  Hemoglobin 13.2 - 17.1 g/dL 14.4 13.7 13.2  Hematocrit 38.5 - 50.0 % 43.9 40.9 40.2  Platelets 140 - 400 Thousand/uL 214 180.0 191    Lab Results  Component Value Date/Time   VD25OH 39 10/16/2008 08:55 PM    Clinical ASCVD: Yes  The ASCVD Risk score (Arnett DK, et al., 2019) failed to calculate for the following reasons:   The 2019 ASCVD risk score is only valid for ages 64 to 24   The patient has a prior MI or stroke diagnosis     Social History   Tobacco Use  Smoking Status Former   Packs/day: 0.50   Years: 20.00   Pack years: 10.00   Types: Cigarettes   Quit date: 02/23/1983   Years since quitting: 37.8  Smokeless Tobacco Never   BP Readings from Last 3 Encounters:  12/17/20 118/68  12/12/20 118/64  12/10/20 117/65   Pulse Readings from Last 3 Encounters:  12/17/20 (!) 54  12/12/20 (!) 56  12/10/20 77   Wt Readings from Last 3 Encounters:  12/25/20 172 lb (78 kg)  12/17/20 172 lb 6.4 oz (78.2 kg)  12/12/20 171 lb 2 oz (77.6 kg)    Assessment: Review of patient past medical history, allergies, medications, health status, including review of consultants reports, laboratory and other test data, was performed as part of comprehensive evaluation and provision of chronic care management services.   SDOH:  (Social Determinants of Health) assessments and interventions performed:  SDOH Interventions    Flowsheet Row Most Recent Value  SDOH Interventions   Financial Strain Interventions Other (Comment)  [patient initially denied assistance for Eliquis, Needed to provide evidience he has spent 3% of income OOP on medications for 2022. Assisting patient with requesting updated med cost for 2022 report from pharmacy.]       CCM Care Plan  Allergies  Allergen Reactions   Prednisolone Acetate     Increased intraocular pressure   Statins Other (See Comments)    Myalgias  (intolerance) Simvastatin, atorvastatin, pravastatin    Medications Reviewed Today     Reviewed by [name], RPH-CPP (Pharmacist) on 01/06/21 at 1328  Med List Status: <None>   Medication Order Taking? Sig Documenting Provider Last Dose Status Informant  acetaminophen (TYLENOL) 650 MG CR tablet [id number] Yes Take 1,300 mg by mouth every 8 (eight) hours as needed for pain. [provider] Taking Active Multiple Informants  ALPRAZolam (XANAX) 0.25 MG tablet [id number] Yes Take 1 tablet (0.25 mg total) by mouth 2 (two) times daily as needed for anxiety. Colon Branch, MD Taking Active   amLODipine (NORVASC) 5 MG tablet [id number] Yes TAKE 1 TABLET(5 MG) BY MOUTH DAILY Colon Branch, MD Taking Active   Arginine 500 MG CAPS [id number] Yes Take 1,000 mg by mouth daily.  [provider] Taking Active Multiple Informants  Baclofen 5 MG TABS [id number] Yes 1 tablet three times  a day as needed for pain Cameron Sprang, MD Taking Active   Coenzyme Q10 100 MG TABS [id number] Yes Take 1 tablet by mouth in the morning and at bedtime. [provider] Taking Active   ELIQUIS 5 MG TABS tablet [id number] Yes TAKE 1 TABLET BY MOUTH TWICE DAILY Hilty, Nadean Corwin, MD Taking Active   gabapentin (NEURONTIN) 300 MG capsule [id number] Yes Take 1 capsule (300 mg total) by mouth See admin instructions. Take 300 mg by mouth four times a day- morning, 8:30 PM, 9:30 PM, and 10:00 PM Cameron Sprang, MD Taking Active   Glucosamine-MSM-Hyaluronic Acd ([place]) [id number] Yes Take 1 capsule by mouth daily.  [provider] Taking Active Multiple Informants  ketorolac (ACULAR) 0.5 % ophthalmic solution [id number] Yes SMARTSIG:1 Drop(s) Right Eye 4 Times Daily PRN [provider] Taking Active   loratadine (CLARITIN) 10 MG tablet [id number] Yes Take 10 mg by mouth at bedtime. [provider] Taking Active Multiple Informants           Med Note Pilar Plate Sep 12, 2016 11:40 AM)    losartan (COZAAR) 100 MG tablet [id number] Yes Take 1 tablet (100 mg total) by mouth daily. Shelda Pal, DO Taking Active   metoprolol succinate (TOPROL-XL) 25 MG 24 hr tablet [id number] Yes TAKE 3 TABLETS(75 MG) BY MOUTH DAILY Colon Branch, MD Taking Active   Multiple Vitamins-Minerals (OCUVITE PRESERVISION PO) [id number] Yes Take 1 capsule by mouth 2 (two) times daily.  [provider] Taking Active Multiple Informants  nitroGLYCERIN (NITROSTAT) 0.4 MG SL tablet [id number] Yes Place 1 tablet (0.4 mg total) under the tongue every 5 (five) minutes as needed for chest pain. Colon Branch, MD Taking Active   NON FORMULARY [id number] Yes Take 1-2 packets by mouth See admin instructions. Lypo-Spheric vitamin C gel packets- Mix 1-2 packets of gel into juice one to two times a day and drink [provider] Taking Active Multiple Informants  NON FORMULARY [id number] Yes Take 10 mg by mouth See admin instructions. [employer] Lyco-Sorb (for Prostate & Cardiovascular Health) 10 mg capsules: Take 10 mg by mouth two times a day [provider] Taking Active Multiple Informants  Omega-3 Fatty Acids (FISH OIL PO) [id number] Yes Take 1 capsule by mouth in the morning and at bedtime. [provider] Taking Active Multiple Informants  omeprazole (PRILOSEC) 40 MG capsule [id number] Yes Take 1 capsule (40 mg total) by mouth daily. Colon Branch, MD Taking Active   rosuvastatin (CRESTOR) 40 MG tablet [id number] Yes TAKE 1 TABLET(40 MG) BY MOUTH DAILY Colon Branch, MD Taking Active   Vitamin D, Cholecalciferol, 25 MCG (1000 UT) TABS [id number] Yes Take 1 tablet by mouth daily. [provider] Taking Active             Patient Active Problem List   Diagnosis Date Noted   Chronic rhinitis 03/29/2019   TIA (transient ischemic attack) 12/03/2017   Chronic anticoagulation 12/03/2017   H/O: stroke 12/03/2017   Paroxysmal atrial fibrillation ([place])  07/11/2017   SCC (squamous cell carcinoma) 06/22/2017   Varicose veins of both lower extremities 06/19/2017   Bilateral lower extremity edema 06/19/2017   Trichiasis without entropion of right lower eyelid 04/14/2017   Paresthesias 03/03/2017   Trigeminal neuralgia of right side of face 10/01/2016   History of mitral valve repair 09/30/2016   Cerebrovascular accident (CVA) ([place]) 09/12/2016   GERD (gastroesophageal reflux disease) 09/22/2015  PCP NOTES >>>>> 11/08/2014   Chest pain 06/07/2014   Facial neuropathy 04/02/2013   Anxiety and depression 02/26/2013   Annual physical exam 08/30/2012   HTN (hypertension) 01/22/2011   BPH (benign prostatic hyperplasia) 10/16/2008   Hyperglycemia 10/16/2008   Hyperlipidemia 06/15/2006   Mitral valve disease 06/15/2006   Allergic rhinitis 06/15/2006   COLON CANCER, HX OF 06/15/2006    Immunization History  Administered Date(s) Administered   Fluad Quad(high Dose 65+) 11/13/2018, 12/12/2020   Influenza Whole 03/10/2007, 11/17/2007   Influenza-Unspecified 01/17/2017   PFIZER(Purple Top)SARS-COV-2 Vaccination 04/17/2019, 05/08/2019, 01/02/2020   Pneumococcal Conjugate-13 01/16/2014   Pneumococcal Polysaccharide-23 01/12/2005, 04/21/2010, 06/11/2020   Td 06/15/2006, 01/05/2015   Tdap 12/13/2016   Zoster Recombinat (Shingrix) 02/03/2017, 12/14/2018   Zoster, Live 03/25/1998   Conditions to be addressed/monitored: Atrial Fibrillation, CAD, HTN, HLD, Anxiety and trigeminal neuralgia with ocular effects; BPH; pre DM; h/o recurrent stroke   Care Plan : General Pharmacy (Adult)  Updates made by [name], RPH-CPP since 01/06/2021 12:00 AM     Problem: Chronic Disease Management support, education, and care coordination needs related to  AFib, Pre-DM, HTN, HLD, Hx of Stroke, Anxiety/Depression, GERD, Allergic Rhinitis, Trigeminal Neuralgia   Priority: Medium  Onset Date: 06/19/2020  Note:   Current Barriers:  In Medicare coverage gap  and needs assistance with cost of Eliquis  Pharmacist Clinical Goal(s):  Over the next 180 days, patient will maintain control of HTN, hyperlipidemia as evidenced by attainment of goals listed below  adhere to plan to optimize therapeutic regimen for hyperlipiemia as evidenced by report of adherence to recommended medication management changes contact provider office for questions/concerns as evidenced notation of same in electronic health record through collaboration with PharmD and provider.   Interventions: 1:1 collaboration with Colon Branch, MD regarding development and update of comprehensive plan of care as evidenced by provider attestation and co-signature Inter-disciplinary care team collaboration (see longitudinal plan of care) Comprehensive medication review performed; medication list updated in electronic medical record   Hypertension BP Readings from Last 3 Encounters:  12/17/20 118/68  12/12/20 118/64  12/10/20 117/65  Currently at goal of BP <130/80 Current regimen:  Amlodipine 60m daily in the afternoon (patient taking in evening) Losartan 1074m- take 1 tablet each morning Metoprolol succinate ER 2563m take 3 tablets = 60m55mch morning Amlodipine stopped in September 2022 due to dizziness and low blood pressure; however blood pressure increase and was restarted.  Home blood pressure reading - only had a few to report: 136/68; 122/64 Patient denies dizziness over the last 2 weeks.  He also has been experiencing chest pain. Has not experience in last 2 weeks but has NTG to use if needed and has seen cardiology for workup of chest pain. Stress test show low risk.  Interventions: Discussed blood pressure goal Recommended to check blood pressure 2 to 3 times per week, document, and provide at future appointments Ensure daily salt intake < 2300 mg/day Continue to take losartan and metoprolol in morning and amlodipine in evening.   Hyperlipidemia  Goal < 70 (history of  stroke) Currently at goal Current regimen:  Rosuvastatin 40mg86mly Omega 3 fatty acid - twice daily Coenzyme Q10 - 100 mg twice a day Interventions: Discussed LDL goal  Continue to take rosuvastatin 40mg 72mOmega 3 fatty acids  Atrial Fibrillation:  Current regimen:  Metoprolol succinate ER 25mg -59me 3 tablets = 60mg da6m Eliquis 5mg - ta88m1 tablet twice a day Patient  denies and signs or symptoms of bleeding Patient applied for Eliquis thru cardiology office but was denied. Per denial letter unsure if patient did not submit pharmacy records or if at the time he has not met 3% out of pocket medication cost requirement.  Interventions: Reviewed Eliquis dosing - no need for dose adjustment Will get report of patient's 2022 medication cost to current date and resubmit to Eliquis patient assistance program to see if he now meets 3% out of pocket spend requirement.  Continue to current regimen for atrial fibrillation and stroke prevention  Medication management Current pharmacy: [facility] Interventions Comprehensive medication review performed. Continue current medication management strategy Reviewed adherence through fill history. Patient filled rosuvastatin for 90 day supply on 09/23/20 and again 12/25/20 Patient is reminded and encouraged to report any questions or concerns to PharmD and/or provider  Patient Goals/Self-Care Activities Over the next 180 days, patient will:  take medications as prescribed, check blood pressure 2 to 3 times per week , document, and provide at future appointments, and collaborate with provider on medication access solutions  Follow Up Plan: Telephone follow up appointment with care management team member scheduled for: 3 months.       Medication Assistance:  patient completed patient assistance program applicaton for Eliquis in August 2022 but has not met 3% OOP spend yet. He likely has met that now. Requesting updated 2022 medication cost report  from [facility]. Plan to resubmit to Eliquis patient assistance program.   Patient's preferred pharmacy is:  [facility] [place], [place] [address] [place] [address] Phone: [phone] Fax: [place] ([address] [place] [address] [place])  [address] Phone: [phone] Fax: [phone]   Follow Up:  Patient agrees to Care Plan and Follow-up.  Plan: Telephone follow up appointment with care management team member scheduled for:  pharmacist to follow up in 3 months.   [name], PharmD Clinical Pharmacist [facility]

## 2021-01-08 ENCOUNTER — Telehealth: Payer: Self-pay | Admitting: Pharmacist

## 2021-01-08 NOTE — Telephone Encounter (Signed)
Patient brought in updated pharmacy report with total out of pocket spend for 2022. Discussed with patient assistance program for Eliquis and will allow resubmission of report. Verified no new application is needed - can use the one submitted by Dr [facility] office in September 2022.  Faxed to [employer] patient assistance program.

## 2021-01-12 ENCOUNTER — Telehealth: Payer: Self-pay | Admitting: Internal Medicine

## 2021-01-12 NOTE — Telephone Encounter (Signed)
Received fax that patient is approved for Eliquis assistance from [employer] from 01/09/21 - 02/21/21

## 2021-01-21 ENCOUNTER — Telehealth: Payer: PPO

## 2021-01-21 DIAGNOSIS — I10 Essential (primary) hypertension: Secondary | ICD-10-CM | POA: Diagnosis not present

## 2021-01-21 DIAGNOSIS — E785 Hyperlipidemia, unspecified: Secondary | ICD-10-CM | POA: Diagnosis not present

## 2021-01-21 DIAGNOSIS — I4891 Unspecified atrial fibrillation: Secondary | ICD-10-CM | POA: Diagnosis not present

## 2021-01-29 ENCOUNTER — Ambulatory Visit: Payer: PPO | Admitting: Pharmacist

## 2021-01-29 DIAGNOSIS — I10 Essential (primary) hypertension: Secondary | ICD-10-CM

## 2021-01-29 DIAGNOSIS — Z79899 Other long term (current) drug therapy: Secondary | ICD-10-CM

## 2021-01-29 DIAGNOSIS — E785 Hyperlipidemia, unspecified: Secondary | ICD-10-CM

## 2021-01-29 DIAGNOSIS — I4891 Unspecified atrial fibrillation: Secondary | ICD-10-CM

## 2021-01-29 NOTE — Patient Instructions (Signed)
   CARE PLAN ENTRY (see longitudinal plan of care for additional care plan information)  Current Barriers:  Chronic Disease Management support, education, and care coordination needs related to  AFib, Pre-DM, HTN, HLD, Hx of Stroke, Anxiety/Depression, GERD, Allergic Rhinitis, Trigeminal Neuralgia   Hypertension BP Readings from Last 3 Encounters:  12/17/20 118/68  12/12/20 118/64  12/10/20 117/65   Pharmacist Clinical Goal(s): Over the next 180 days, patient will work with PharmD and providers to maintain BP goal <130/80 Current regimen:  Losartan 100mg  - take 1 tablet daily in morning Metoprolol succinate 25mg  daily - take 2 tablets = 75mg  daily in morning Amlodipine 5mg  - take 1 tablet daily in evening Interventions: Discussed blood pressure goal Patient self care activities - Over the next 180 days, patient will: Check blood pressure 2 to 3 times per week, document, and provide at future appointments Ensure daily salt intake < 2300 mg/day  Hyperlipidemia Lab Results  Component Value Date   LDLCALC 57 12/12/2020   Pharmacist Clinical Goal(s): Over the next 180 days, patient will work with PharmD and providers to maintain LDL goal < 70 Current regimen:  Rosuvastatin 40mg  daily  Omega 3 - twice daily CoEnzyme Q10 100mg  twice a day Interventions: Discussed LDL goal  Patient self care activities - Over the next 180 days, patient will: Continue to take rosuvastatin 40mg   daily  Atrial Fibrillation:  Pharmacist Clinical Goal(s): Over the next 180 days, patient will work with PharmD and providers to prevent stroke Current regimen:  Metoprolol succinate ER 25mg  - take 3 tablets = 75mg  daily each morning Eliquis 5mg  - take 1 tablet twice a day Interventions: Reviewed Eliquis dosing - no need for dose adjustment Requested updated 2022 medication cost report from [facility]. Submitted to Eliquis patient assistance program. (Previous visit) Patient self care activities - Over  the next 180 days, patient will: Continue to current regimen for atrial fibrillation and stroke prevention   Medication management Pharmacist Clinical Goal(s): Over the next 180 days, patient will work with PharmD and providers to maintain optimal medication adherence Current pharmacy: [facility] Interventions Comprehensive medication review performed. Continue current medication management strategy Patient self care activities - Over the next 180 days, patient will: Focus on medication adherence by filling and taking medications appropriately  Take medications as prescribed Report any questions or concerns to PharmD and/or provider(s) - especially if you feel you cannot continue to take rosuvastatin  The patient verbalized understanding of instructions, educational materials, and care plan provided today and declined offer to receive copy of patient instructions, educational materials, and care plan.

## 2021-01-29 NOTE — Chronic Care Management (AMB) (Signed)
Chronic Care Management Pharmacy Note  01/29/2021 Name:  Kevin Bass MRN:  [id number] DOB:  [birth year]  Subjective: Kevin Bass is an 80 y.o. year old male who is a primary patient of [name], Alda Berthold, MD.  The CCM team was consulted for assistance with disease management and care coordination needs.    Engaged with patient by telephone for follow up visit in response to provider referral for pharmacy case management and/or care coordination services.   Consent to Services:  The patient was given information about Chronic Care Management services, agreed to services, and gave verbal consent prior to initiation of services.  Please see initial visit note for detailed documentation.   Patient Care Team: Colon Branch, MD as PCP - General Lovena Le Champ Mungo, MD as PCP - Cardiology (Cardiology) Raynelle Bring, MD as Consulting Physician (Urology) Valaria Good, MD as Referring Physician (Cardiology) Antionette Fairy Isaias Cowman, MD as Consulting Physician (Ophthalmology) Cameron Sprang, MD as Consulting Physician (Neurology) [name], RPH-CPP (Pharmacist)  Recent office visits: 12/12/2020 - Internal Med (Dr [name]) Complete Physical. Patient reported some chest pain. EKG checked. Referred to cardiology. Prescribed NTG as ndded 11/21/2020 - Internal Med (Dr [name]) F/U HTN. Continued losartan 132m each morning, metoprolol succinate 771meach morning. Restart amlodipine 7m18mn the afternoon.  11/20/2020 - Fam Med (Dr WenNani RavensDr PazLarose Kellshone Call - re BP. BP was 165/90 since recent med changes. Recommended take amlodipine 7mg59mr next 2 days and follow up with PCP.  11/18/2020 - Fam Med (Dr WendNani Ravensen for HTN. Stopped amlodipine; Increased losartan to 100mg24mly and increased metoprolol ER to 77mg 59my 08/11/2020 - My Chart message - low BP readings. Dr [name] reLarose Kellsmended holding amlodipine.  08/08/2020 - PCP (Dr [name]) -Larose Kellscreased losartan to 50mg o31m2 of 100mg ta22m; Decreased  metoprolol from 3 tabs to 2.5tabs = 62.7mg dail69m  Recent consult visits: 12/25/2020 - Cardio Stress test completed. Noted that results showed low risk 12/17/2020 - Cardio (Dr Tobb) SeeHarriet Massonr chest pain. Ordered stress test. Continue NTG as needed.  12/10/2020 - Neuro (Dr Aquino) FDelice Leschgeminal neuralgia. Continue current medications/ Follow up in 8 mos.  10/13/2020 - Cardio (Dr Hilty) PhDebara Pickettall - has not met 3% OOP spend for Eliquis PAP.  06/03/2020 - Neuro (Dr Aquino) -Delice Leschrigeminal neuralgia and recurrent strokes. No medication changes noted - continue baclofen and gabapentin for trigeminal neuralgia and Eliquis for recurrent stroke  05/07/2020 - Ophthalmology ([facility]. [facility]tion[facility] drops in right eye - can increase IOP;  Start ketorolac as needed QID OD for pain - safe to use long term. Continue lubricating drops on regular basis  Hospital visits: None in previous 6 months  Objective:  Lab Results  Component Value Date   CREATININE 1.04 11/21/2020   CREATININE 1.08 06/11/2020   CREATININE 1.08 12/12/2019    Lab Results  Component Value Date   HGBA1C 5.4 12/12/2020   Last diabetic Eye exam: No results found for: HMDIABEYEEXA  Last diabetic Foot exam: No results found for: HMDIABFOOTEX      Component Value Date/Time   CHOL 116 12/12/2020 1340   TRIG 83 12/12/2020 1340   HDL 42 12/12/2020 1340   CHOLHDL 2.8 12/12/2020 1340   VLDL 25.4 12/11/2018 1101   LDLCALC 57 12/12/2020 1340   LDLDIRECT 149.1 08/30/2012 1352    Hepatic Function Latest Ref Rng & Units 12/12/2020 08/30/2019 08/09/2018  Total Protein 6.5 - 8.1 g/dL - 7.2  6.2  Albumin 3.5 - 5.0 g/dL - 4.1 4.0  AST 10 - 35 U/L _0 ALT 9 - 46 U/L _1 Alk Phosphatase 38 - 126 U/L - 47 51  Total Bilirubin 0.3 - 1.2 mg/dL - 1.0 0.5    Lab Results  Component Value Date/Time   TSH 2.19 06/11/2020 01:51 PM   TSH 1.850 12/03/2017 06:26 AM   TSH 1.88 02/25/2017 03:57 PM    CBC Latest Ref  Rng & Units 12/12/2020 06/11/2020 12/12/2019  WBC 3.8 - 10.8 Thousand/uL 6.6 6.1 5.8  Hemoglobin 13.2 - 17.1 g/dL 14.4 13.7 13.2  Hematocrit 38.5 - 50.0 % 43.9 40.9 40.2  Platelets 140 - 400 Thousand/uL 214 180.0 191    Lab Results  Component Value Date/Time   VD25OH 39 10/16/2008 08:55 PM    Clinical ASCVD: Yes  The ASCVD Risk score (Arnett DK, et al., 2019) failed to calculate for the following reasons:   The 2019 ASCVD risk score is only valid for ages 33 to 24   The patient has a prior MI or stroke diagnosis     Social History   Tobacco Use  Smoking Status Former   Packs/day: 0.50   Years: 20.00   Pack years: 10.00   Types: Cigarettes   Quit date: 02/23/1983   Years since quitting: 37.9  Smokeless Tobacco Never   BP Readings from Last 3 Encounters:  12/17/20 118/68  12/12/20 118/64  12/10/20 117/65   Pulse Readings from Last 3 Encounters:  12/17/20 (!) 54  12/12/20 (!) 56  12/10/20 77   Wt Readings from Last 3 Encounters:  12/25/20 172 lb (78 kg)  12/17/20 172 lb 6.4 oz (78.2 kg)  12/12/20 171 lb 2 oz (77.6 kg)    Assessment: Review of patient past medical history, allergies, medications, health status, including review of consultants reports, laboratory and other test data, was performed as part of comprehensive evaluation and provision of chronic care management services.   SDOH:  (Social Determinants of Health) assessments and interventions performed:     CCM Care Plan  Allergies  Allergen Reactions   Prednisolone Acetate     Increased intraocular pressure   Statins Other (See Comments)    Myalgias (intolerance) Simvastatin, atorvastatin, pravastatin    Medications Reviewed Today     Reviewed by [name], RPH-CPP (Pharmacist) on 01/29/21 at 1321  Med List Status: <None>   Medication Order Taking? Sig Documenting Provider Last Dose Status Informant  acetaminophen (TYLENOL) 650 MG CR tablet [id number] Yes Take 1,300 mg by mouth every 8 (eight)  hours as needed for pain. [provider] Taking Active Multiple Informants  ALPRAZolam (XANAX) 0.25 MG tablet [id number] Yes Take 1 tablet (0.25 mg total) by mouth 2 (two) times daily as needed for anxiety. Colon Branch, MD Taking Active   amLODipine (NORVASC) 5 MG tablet [id number] Yes TAKE 1 TABLET(5 MG) BY MOUTH DAILY Colon Branch, MD Taking Active   Arginine 500 MG CAPS [id number] Yes Take 1,000 mg by mouth daily.  [provider] Taking Active Multiple Informants  Baclofen 5 MG TABS [id number] Yes 1 tablet three times a day as needed for pain Cameron Sprang, MD Taking Active   Coenzyme Q10 100 MG TABS [id number] Yes Take 1 tablet by mouth in the morning and at bedtime. [provider] Taking Active   ELIQUIS 5 MG TABS tablet [id number] Yes TAKE 1 TABLET BY MOUTH TWICE DAILY Hilty, Nadean Corwin, MD  Taking Active   gabapentin (NEURONTIN) 300 MG capsule [id number] Yes Take 1 capsule (300 mg total) by mouth See admin instructions. Take 300 mg by mouth four times a day- morning, 8:30 PM, 9:30 PM, and 10:00 PM Cameron Sprang, MD Taking Active   Glucosamine-MSM-Hyaluronic Acd (Why) [id number] Yes Take 1 capsule by mouth daily.  [provider] Taking Active Multiple Informants  ketorolac (ACULAR) 0.5 % ophthalmic solution [id number] Yes SMARTSIG:1 Drop(s) Right Eye 4 Times Daily PRN [provider] Taking Active   loratadine (CLARITIN) 10 MG tablet [id number] Yes Take 10 mg by mouth at bedtime. [provider] Taking Active Multiple Informants           Med Note Pilar Plate Sep 12, 2016 11:40 AM)    losartan (COZAAR) 100 MG tablet [id number] Yes Take 1 tablet (100 mg total) by mouth daily. Shelda Pal, DO Taking Active   metoprolol succinate (TOPROL-XL) 25 MG 24 hr tablet [id number] Yes TAKE 3 TABLETS(75 MG) BY MOUTH DAILY Colon Branch, MD Taking Active   Multiple Vitamins-Minerals (OCUVITE PRESERVISION PO) [id number] Yes Take 1  capsule by mouth 2 (two) times daily.  [provider] Taking Active Multiple Informants  nitroGLYCERIN (NITROSTAT) 0.4 MG SL tablet [id number] Yes Place 1 tablet (0.4 mg total) under the tongue every 5 (five) minutes as needed for chest pain. Colon Branch, MD Taking Active   NON FORMULARY [id number] Yes Take 1-2 packets by mouth See admin instructions. Lypo-Spheric vitamin C gel packets- Mix 1-2 packets of gel into juice one to two times a day and drink [provider] Taking Active Multiple Informants  NON FORMULARY [id number] Yes Take 10 mg by mouth See admin instructions. [employer] Lyco-Sorb (for Prostate & Cardiovascular Health) 10 mg capsules: Take 10 mg by mouth two times a day [provider] Taking Active Multiple Informants  Omega-3 Fatty Acids (FISH OIL PO) [id number] Yes Take 1 capsule by mouth in the morning and at bedtime. [provider] Taking Active Multiple Informants  omeprazole (PRILOSEC) 40 MG capsule [id number] Yes Take 1 capsule (40 mg total) by mouth daily. Colon Branch, MD Taking Active   rosuvastatin (CRESTOR) 40 MG tablet [id number] Yes TAKE 1 TABLET(40 MG) BY MOUTH DAILY Colon Branch, MD Taking Active   Vitamin D, Cholecalciferol, 25 MCG (1000 UT) TABS [id number] Yes Take 1 tablet by mouth daily. [provider] Taking Active             Patient Active Problem List   Diagnosis Date Noted   Chronic rhinitis 03/29/2019   TIA (transient ischemic attack) 12/03/2017   Chronic anticoagulation 12/03/2017   H/O: stroke 12/03/2017   Paroxysmal atrial fibrillation ([place]) 07/11/2017   SCC (squamous cell carcinoma) 06/22/2017   Varicose veins of both lower extremities 06/19/2017   Bilateral lower extremity edema 06/19/2017   Trichiasis without entropion of right lower eyelid 04/14/2017   Paresthesias 03/03/2017   Trigeminal neuralgia of right side of face 10/01/2016   History of mitral valve repair 09/30/2016   Cerebrovascular  accident (CVA) ([place]) 09/12/2016   GERD (gastroesophageal reflux disease) 09/22/2015   PCP NOTES >>>>> 11/08/2014   Chest pain 06/07/2014   Facial neuropathy 04/02/2013   Anxiety and depression 02/26/2013   Annual physical exam 08/30/2012   HTN (hypertension) 01/22/2011   BPH (benign prostatic hyperplasia) 10/16/2008   Hyperglycemia 10/16/2008   Hyperlipidemia 06/15/2006   Mitral valve disease 06/15/2006   Allergic rhinitis  06/15/2006   COLON CANCER, HX OF 06/15/2006    Immunization History  Administered Date(s) Administered   Fluad Quad(high Dose 65+) 11/13/2018, 12/12/2020   Influenza Whole 03/10/2007, 11/17/2007   Influenza-Unspecified 01/17/2017   PFIZER(Purple Top)SARS-COV-2 Vaccination 04/17/2019, 05/08/2019, 01/02/2020   Pneumococcal Conjugate-13 01/16/2014   Pneumococcal Polysaccharide-23 01/12/2005, 04/21/2010, 06/11/2020   Td 06/15/2006, 01/05/2015   Tdap 12/13/2016   Zoster Recombinat (Shingrix) 02/03/2017, 12/14/2018   Zoster, Live 03/25/1998   Conditions to be addressed/monitored: Atrial Fibrillation, CAD, HTN, HLD, Anxiety and trigeminal neuralgia with ocular effects; BPH; pre DM; h/o recurrent stroke   Care Plan : General Pharmacy (Adult)  Updates made by [name], RPH-CPP since 01/29/2021 12:00 AM     Problem: Chronic Disease Management support, education, and care coordination needs related to  AFib, Pre-DM, HTN, HLD, Hx of Stroke, Anxiety/Depression, GERD, Allergic Rhinitis, Trigeminal Neuralgia   Priority: Medium  Onset Date: 06/19/2020  Note:   Current Barriers:  In Medicare coverage gap and needs assistance with cost of Eliquis  Pharmacist Clinical Goal(s):  Over the next 180 days, patient will maintain control of HTN, hyperlipidemia as evidenced by attainment of goals listed below  adhere to plan to optimize therapeutic regimen for hyperlipiemia as evidenced by report of adherence to recommended medication management changes contact provider  office for questions/concerns as evidenced notation of same in electronic health record through collaboration with PharmD and provider.   Interventions: 1:1 collaboration with Colon Branch, MD regarding development and update of comprehensive plan of care as evidenced by provider attestation and co-signature Inter-disciplinary care team collaboration (see longitudinal plan of care) Comprehensive medication review performed; medication list updated in electronic medical record   Hypertension BP Readings from Last 3 Encounters:  12/17/20 118/68  12/12/20 118/64  12/10/20 117/65  Currently at goal of BP <130/80 Current regimen:  Amlodipine 55m daily in the afternoon (patient taking in evening) Losartan 1044m- take 1 tablet each morning Metoprolol succinate ER 2565m take 3 tablets = 93m9mch morning Amlodipine stopped in September 2022 due to dizziness and low blood pressure; however blood pressure increase and was restarted.  Home blood pressure reading ranges from 125 to 135 / 65 to 75 Patient denies dizziness, Denied recent chest pain Interventions: Discussed blood pressure goal Recommended to check blood pressure 2 to 3 times per week, document, and provide at future appointments Ensure daily salt intake < 2300 mg/day Continue to take losartan and metoprolol in morning and amlodipine in evening.   Hyperlipidemia  Goal < 70 (history of stroke) Currently at goal Current regimen:  Rosuvastatin 40mg22mly Omega 3 fatty acid - twice daily Coenzyme Q10 - 100 mg twice a day Interventions: Discussed LDL goal  Continue to take rosuvastatin 40mg 91mOmega 3 fatty acids  Atrial Fibrillation:  Current regimen:  Metoprolol succinate ER 25mg -93me 3 tablets = 93mg da57m Eliquis 5mg - ta22m1 tablet twice a day Patient denies and signs or symptoms of bleeding Patient applied for Eliquis thru cardiology office but was denied. Per denial letter unsure if patient did not submit pharmacy  records or if at the time he has not met 3% out of pocket medication cost requirement.  12/2020 had patient being in updated pharmacy report for 2022 and resubmitted out of pocket expense for 2022. Patient was approved to received Eliquis on 01/12/2021 thru 02/21/2021.  Patient endorses that he received 3 month supply of Eliquis on 01/18/2021 Interventions: Reviewed Eliquis dosing - no need for  dose adjustment Continue to current regimen for atrial fibrillation and stroke prevention Will continue to follow out of pocket cost in 2023 and assist if needed for Eliquis patient assistance program (patient must reach coverage gap and spend 3% of income out of pocket to apply.  Medication management Current pharmacy: [facility] Interventions Comprehensive medication review performed. Continue current medication management strategy Reviewed adherence through fill history. Patient filled rosuvastatin for 90 day supply on 09/23/20 and again 12/25/20 Patient is reminded and encouraged to report any questions or concerns to PharmD and/or provider  Patient Goals/Self-Care Activities Over the next 180 days, patient will:  take medications as prescribed, check blood pressure 2 to 3 times per week , document, and provide at future appointments, and collaborate with provider on medication access solutions  Follow Up Plan: Telephone follow up appointment with care management team member scheduled for: 3 months.       Medication Assistance:  patient completed patient assistance program applicaton for Eliquis in August 2022 but has not met 3% OOP spend yet. He likely has met that now. Requesting updated 2022 medication cost report from [facility]. Plan to resubmit to Eliquis patient assistance program.   Update: patient was approved for medication assistance program for Eliquis 01/12/2021 through 02/21/2021   Patient's preferred pharmacy is:  [facility] [place], [place]  AT [address] [place] [address] Phone: [phone] Fax: [place] ([address]), [place] [address] [place]) [place] [address] Phone: [phone] Fax: [phone]   Follow Up:  Patient agrees to Care Plan and Follow-up.  Plan: Telephone follow up appointment with care management team member scheduled for:  pharmacist to follow up in 3 months.   [name], PharmD Clinical Pharmacist [facility]

## 2021-02-10 ENCOUNTER — Other Ambulatory Visit: Payer: Self-pay | Admitting: Internal Medicine

## 2021-02-20 ENCOUNTER — Other Ambulatory Visit: Payer: Self-pay | Admitting: Internal Medicine

## 2021-03-30 ENCOUNTER — Other Ambulatory Visit: Payer: Self-pay | Admitting: Neurology

## 2021-04-07 ENCOUNTER — Ambulatory Visit (INDEPENDENT_AMBULATORY_CARE_PROVIDER_SITE_OTHER): Payer: PPO | Admitting: Pharmacist

## 2021-04-07 DIAGNOSIS — I4891 Unspecified atrial fibrillation: Secondary | ICD-10-CM

## 2021-04-07 DIAGNOSIS — I10 Essential (primary) hypertension: Secondary | ICD-10-CM

## 2021-04-07 DIAGNOSIS — E785 Hyperlipidemia, unspecified: Secondary | ICD-10-CM

## 2021-04-07 MED ORDER — LOSARTAN POTASSIUM 25 MG PO TABS: 75.0000 mg | ORAL_TABLET | Freq: Every day | ORAL | 1 refills | Status: DC

## 2021-04-07 NOTE — Patient Instructions (Addendum)
[name], It was a pleasure speaking with you today.  I have attached a summary of our visit today and information about your health goals.   Patient Goals/Self-Care Activities Over the next 180 days, patient will:  take medications as prescribed,  check blood pressure 2 to 3 times per week , document, and provide at future appointments Change to losartan 25mg  tablets - take 2 tablets = 75mg  daily collaborate with provider on medication access solutions Remember to make a 6 month follow up visit with Dr [name] in April 2023  If you have any questions or concerns, please feel free to contact me either at the phone number below or with a MyChart message.   Keep up the good work!  [name], PharmD Clinical Pharmacist [facility] [phone] (direct line)  [phone] (main office number)  CARE PLAN ENTRY 04/07/2021 - Updated    Hypertension BP Readings from Last 3 Encounters:  12/17/20 118/68  12/12/20 118/64  12/10/20 117/65   Pharmacist Clinical Goal(s): Over the next 180 days, patient will work with PharmD and providers to maintain BP goal <130/80 Current regimen:  Losartan 100mg  - take 1 tablet daily in morning (patient is taking 0.75 tablet = 75mg  daily) Metoprolol succinate 25mg  daily - take 2 tablets = 75mg  daily in morning Amlodipine 5mg  - take 1 tablet daily in evening Interventions: Discussed blood pressure goal Changed prescription for losartan to 25mg  - take 3 tablets = 75mg  daily Patient self care activities - Over the next 180 days, patient will: Check blood pressure 2 to 3 times per week, document, and provide at future appointments Ensure daily salt intake < 2300 mg/day Pick up, updated prescription for losartan 25mg  - take 3 tablets = 75mg  daily   Hyperlipidemia Lab Results  Component Value Date   [facility] 57 12/12/2020   Pharmacist Clinical Goal(s): Over the next 180 days, patient will work with PharmD and  providers to maintain LDL goal < 70 Current regimen:  Rosuvastatin 40mg  daily  Omega 3 - twice daily CoEnzyme Q10 100mg  twice a day Interventions: Discussed LDL goal  Patient self care activities - Over the next 180 days, patient will: Continue to take rosuvastatin 40mg   daily  Atrial Fibrillation:  Pharmacist Clinical Goal(s): Over the next 180 days, patient will work with PharmD and providers to prevent stroke Current regimen:  Metoprolol succinate ER 25mg  - take 3 tablets = 75mg  daily each morning Eliquis 5mg  - take 1 tablet twice a day Interventions: Reviewed Eliquis dosing - no need for dose adjustment Following Medicare gap and out of pocket spend for 2023 - will assist in applying for patient assistance program if needed later in 2023.  Patient self care activities - Over the next 180 days, patient will: Continue to current regimen for atrial fibrillation and stroke prevention   Medication management Pharmacist Clinical Goal(s): Over the next 180 days, patient will work with PharmD and providers to maintain optimal medication adherence Current pharmacy: [facility] Interventions Comprehensive medication review performed. Continue current medication management strategy Patient self care activities - Over the next 180 days, patient will: Focus on medication adherence by filling and taking medications appropriately  Take medications as prescribed Report any questions or concerns to PharmD and/or provider(s) - especially if you feel you cannot continue to take rosuvastatin    Patient verbalizes understanding of instructions and care plan provided today and agrees to view in [place]. Active MyChart status confirmed with patient.

## 2021-04-07 NOTE — Chronic Care Management (AMB) (Signed)
Chronic Care Management Pharmacy Note  04/07/2021 Name:  Kevin Bass MRN:  [id number] DOB:  [birth year]  Summary:  Patient has been taking only 63m of losartan instead of 1066mdaily. Home blood pressure readings and last 3 office BPs have been at goal.  Send in updated prescription for losartan 2550m take 3 tablets = 34m57mily .  Subjective: Kevin Bass 80 y69. year old male who is a primary patient of [name], Kevin Bass.  The CCM team was consulted for assistance with disease management and care coordination needs.    Engaged with patient by telephone for follow up visit in response to provider referral for pharmacy case management and/or care coordination services.   Consent to Services:  The patient was given information about Chronic Care Management services, agreed to services, and gave verbal consent prior to initiation of services.  Please see initial visit note for detailed documentation.   Patient Care Team: [name],Colon Branch as PCP - General TaylLovena LegChamp Mungo as PCP - Cardiology (Cardiology) BordRaynelle Bring as Consulting Physician (Urology) Kevin Bass as Referring Physician (Cardiology) RadiAntionette FairyiIsaias Cowman as Consulting Physician (Ophthalmology) AquiCameron Sprang as Consulting Physician (Neurology) EckaCherre RobinsH-CPP (Pharmacist)  Recent office visits: 12/12/2020 - Internal Med (Dr [name])Kevin Bass. Patient reported some chest pain. EKG checked. Referred to cardiology. Prescribed NTG as ndded 11/21/2020 - Internal Med (Dr [name])Kevin Bass HTN. Continued losartan 100mg34mh morning, metoprolol succinate 34mg 59m morning. Restart amlodipine 5mg in37me afternoon.  11/20/2020 - Fam Med (Dr WendlinNani Ravensaz) PhLarose Bass Call - re BP. BP was 165/90 since recent med changes. Recommended take amlodipine 5mg for52mxt 2 days and follow up with PCP.  11/18/2020 - Fam Med (Dr WendlingNani Ravensor HTN. Stopped amlodipine; Increased losartan to 100mg  da69mand increased metoprolol ER to 34mg dail58m/20/2022 - My Chart message - low BP readings. Dr [name] recommLarose Kellsed holding amlodipine.  08/08/2020 - PCP (Dr [name]) - decLarose Bass losartan to 50mg or 1/81m 100mg tablet36mcreased metoprolol from 3 tabs to 2.5tabs = 62.5mg daily.  74mcent consult visits: 12/25/2020 - Cardio Stress test completed. Noted that results showed low risk 12/17/2020 - Cardio (Dr Tobb) Seen foHarriet Massonest pain. Ordered stress test. Continue NTG as needed.  12/10/2020 - Neuro (Dr Aquino) F/U tDelice Leschnal neuralgia. Continue current medications/ Follow up in 8 mos.  10/13/2020 - Cardio (Dr Hilty) Phone Debara Pickett- has not met 3% OOP spend for Eliquis PAP.  06/03/2020 - Neuro (Dr Aquino) - f/uDelice Leschminal neuralgia and recurrent strokes. No medication changes noted - continue baclofen and gabapentin for trigeminal neuralgia and Eliquis for recurrent stroke  05/07/2020 - [facility] drops in right eye - can increase IOP;  Start ketorolac as needed QID OD for pain - safe to use long term. Continue lubricating drops on regular basis  Hospital visits: None in previous 6 months  Objective:  Lab Results  Component Value Date   CREATININE 1.04 11/21/2020   CREATININE 1.08 06/11/2020   CREATININE 1.08 12/12/2019    Lab Results  Component Value Date   HGBA1C 5.4 12/12/2020   Last diabetic Eye exam: No results found for: HMDIABEYEEXA  Last diabetic Foot exam: No results found for: HMDIABFOOTEX      Component Value Date/Time   CHOL 116 12/12/2020 1340   TRIG 83 12/12/2020 1340   HDL 42 12/12/2020 1340  CHOLHDL 2.8 12/12/2020 1340   VLDL 25.4 12/11/2018 1101   LDLCALC 57 12/12/2020 1340   LDLDIRECT 149.1 08/30/2012 1352    Hepatic Function Latest Ref Rng & Units 12/12/2020 08/30/2019 08/09/2018  Total Protein 6.5 - 8.1 g/dL - 7.2 6.2  Albumin 3.5 - 5.0 g/dL - 4.1 4.0  AST 10 - 35 U/L _0 ALT 9 - 46 U/L _1 Alk Phosphatase 38  - 126 U/L - 47 51  Total Bilirubin 0.3 - 1.2 mg/dL - 1.0 0.5    Lab Results  Component Value Date/Time   TSH 2.19 06/11/2020 01:51 PM   TSH 1.850 12/03/2017 06:26 AM   TSH 1.88 02/25/2017 03:57 PM    CBC Latest Ref Rng & Units 12/12/2020 06/11/2020 12/12/2019  WBC 3.8 - 10.8 Thousand/uL 6.6 6.1 5.8  Hemoglobin 13.2 - 17.1 g/dL 14.4 13.7 13.2  Hematocrit 38.5 - 50.0 % 43.9 40.9 40.2  Platelets 140 - 400 Thousand/uL 214 180.0 191    Lab Results  Component Value Date/Time   VD25OH 39 10/16/2008 08:55 PM    Clinical ASCVD: Yes  The ASCVD Risk score (Arnett DK, et al., 2019) failed to calculate for the following reasons:   The 2019 ASCVD risk score is only valid for ages 32 to 33   The patient has a prior MI or stroke diagnosis     Social History   Tobacco Use  Smoking Status Former   Packs/day: 0.50   Years: 20.00   Pack years: 10.00   Types: Cigarettes   Quit date: 02/23/1983   Years since quitting: 38.1  Smokeless Tobacco Never   BP Readings from Last 3 Encounters:  12/17/20 118/68  12/12/20 118/64  12/10/20 117/65   Pulse Readings from Last 3 Encounters:  12/17/20 (!) 54  12/12/20 (!) 56  12/10/20 77   Wt Readings from Last 3 Encounters:  12/25/20 172 lb (78 kg)  12/17/20 172 lb 6.4 oz (78.2 kg)  12/12/20 171 lb 2 oz (77.6 kg)    Assessment: Review of patient past medical history, allergies, medications, health status, including review of consultants reports, laboratory and other test data, was performed as part of comprehensive evaluation and provision of chronic care management services.   SDOH:  (Social Determinants of Health) assessments and interventions performed:  SDOH Interventions    Flowsheet Row Most Recent Value  SDOH Interventions   Financial Strain Interventions Other (Comment)  [will follow in 2023 and assist with PAP if needed when reaches Medicare coverage gap]        CCM Care Plan  Allergies  Allergen Reactions   Prednisolone  Acetate     Increased intraocular pressure   Statins Other (See Comments)    Myalgias (intolerance) Simvastatin, atorvastatin, pravastatin    Medications Reviewed Today     Reviewed by [name], RPH-CPP (Pharmacist) on 04/07/21 at 1246  Med List Status: <None>   Medication Order Taking? Sig Documenting Provider Last Dose Status Informant  acetaminophen (TYLENOL) 650 MG CR tablet [id number] Yes Take 1,300 mg by mouth every 8 (eight) hours as needed for pain. [provider] Taking Active Multiple Informants  ALPRAZolam (XANAX) 0.25 MG tablet [id number] Yes Take 1 tablet (0.25 mg total) by mouth 2 (two) times daily as needed for anxiety. Colon Branch, MD Taking Active   amLODipine (NORVASC) 5 MG tablet [id number] Yes TAKE 1 TABLET(5 MG) BY MOUTH DAILY Colon Branch, MD Taking Active   Arginine 500 MG CAPS  [id number] Yes Take 1,000 mg by mouth daily.  [provider] Taking Active Multiple Informants  Baclofen 5 MG TABS [id number] Yes TAKE 1 TABLET BY MOUTH THREE TIMES DAILY AS NEEDED FOR PAIN Cameron Sprang, MD Taking Active   Coenzyme Q10 100 MG TABS [id number] Yes Take 1 tablet by mouth in the morning and at bedtime. [provider] Taking Active   ELIQUIS 5 MG TABS tablet [id number] Yes TAKE 1 TABLET BY MOUTH TWICE DAILY Hilty, Nadean Corwin, MD Taking Active   gabapentin (NEURONTIN) 300 MG capsule [id number] Yes Take 1 capsule (300 mg total) by mouth See admin instructions. Take 300 mg by mouth four times a day- morning, 8:30 PM, 9:30 PM, and 10:00 PM Cameron Sprang, MD Taking Active   Glucosamine-MSM-Hyaluronic Acd ([place]) [id number] Yes Take 1 capsule by mouth daily.  [provider] Taking Active Multiple Informants  ketorolac (ACULAR) 0.5 % ophthalmic solution [id number] Yes SMARTSIG:1 Drop(s) Right Eye 4 Times Daily PRN [provider] Taking Active   loratadine (CLARITIN) 10 MG tablet [id number] Yes Take 10 mg by mouth at bedtime. [provider] Taking Active Multiple Informants           Med Note Pilar Plate Sep 12, 2016 11:40 AM)    losartan (COZAAR) 100 MG tablet [id number] Yes Take 1 tablet (100 mg total) by mouth daily. Shelda Pal, DO Taking Active            Med Note Antony Contras, [place] B   Tue Apr 07, 2021 12:43 PM) Per patient he is taking 24m (0.75 tablet) per day  metoprolol succinate (TOPROL-XL) 25 MG 24 hr tablet [id number] TAKE 3 TABLETS(75 MG) BY MOUTH DAILY PColon Branch MD Taking Active   Multiple Vitamins-Minerals (OCUVITE PRESERVISION PO) [id number] Take 1 capsule by mouth 2 (two) times daily.  [provider] Taking Active Multiple Informants  nitroGLYCERIN (NITROSTAT) 0.4 MG SL tablet [id number] Place 1 tablet (0.4 mg total) under the tongue every 5 (five) minutes as needed for chest pain. PColon Branch MD Taking Active   NON FORMULARY [id number] Take 1-2 packets by mouth See admin instructions. Lypo-Spheric vitamin C gel packets- Mix 1-2 packets of gel into juice one to two times a day and drink [provider] Taking Active Multiple Informants  NON FORMULARY [id number] Take 10 mg by mouth See admin instructions. [employer] Lyco-Sorb (for Prostate & Cardiovascular Health) 10 mg capsules: Take 10 mg by mouth two times a day [provider] Taking Active Multiple Informants  Omega-3 Fatty Acids (FISH OIL PO) [id number] Take 1 capsule by mouth in the morning and at bedtime. [provider] Taking Active Multiple Informants  omeprazole (PRILOSEC) 40 MG capsule [id number] TAKE 1 CAPSULE(40 MG) BY MOUTH DAILY PColon Branch MD Taking Active   rosuvastatin (CRESTOR) 40 MG tablet [id number] TAKE 1 TABLET(40 MG) BY MOUTH DAILY PColon Branch MD Taking Active   Vitamin D, Cholecalciferol, 25 MCG (1000 UT) TABS [id number] Take 1 tablet by mouth daily. [provider] Taking Active             Patient Active Problem List    Diagnosis Date Noted   Chronic rhinitis 03/29/2019   TIA (transient ischemic attack) 12/03/2017   Chronic anticoagulation 12/03/2017   H/O: stroke 12/03/2017   Paroxysmal atrial fibrillation ([place] 07/11/2017   SCC (squamous cell carcinoma) 06/22/2017   Varicose  veins of both lower extremities 06/19/2017   Bilateral lower extremity edema 06/19/2017   Trichiasis without entropion of right lower eyelid 04/14/2017   Paresthesias 03/03/2017   Trigeminal neuralgia of right side of face 10/01/2016   History of mitral valve repair 09/30/2016   Cerebrovascular accident (CVA) ([place]) 09/12/2016   GERD (gastroesophageal reflux disease) 09/22/2015   PCP NOTES >>>>> 11/08/2014   Chest pain 06/07/2014   Facial neuropathy 04/02/2013   Anxiety and depression 02/26/2013   Annual Bass exam 08/30/2012   HTN (hypertension) 01/22/2011   BPH (benign prostatic hyperplasia) 10/16/2008   Hyperglycemia 10/16/2008   Hyperlipidemia 06/15/2006   Mitral valve disease 06/15/2006   Allergic rhinitis 06/15/2006   COLON CANCER, HX OF 06/15/2006    Immunization History  Administered Date(s) Administered   Fluad Quad(high Dose 65+) 11/13/2018, 12/12/2020   Influenza Whole 03/10/2007, 11/17/2007   Influenza-Unspecified 01/17/2017   PFIZER(Purple Top)SARS-COV-2 Vaccination 04/17/2019, 05/08/2019, 01/02/2020   Pneumococcal Conjugate-13 01/16/2014   Pneumococcal Polysaccharide-23 01/12/2005, 04/21/2010, 06/11/2020   Td 06/15/2006, 01/05/2015   Tdap 12/13/2016   Zoster Recombinat (Shingrix) 02/03/2017, 12/14/2018   Zoster, Live 03/25/1998   Conditions to be addressed/monitored: Atrial Fibrillation, CAD, HTN, HLD, Anxiety and trigeminal neuralgia with ocular effects; BPH; pre DM; h/o recurrent stroke   Care Plan : General Pharmacy (Adult)  Updates made by [name], RPH-CPP since 04/07/2021 12:00 AM     Problem: Chronic Disease Management support, education, and care coordination needs related to   AFib, Pre-DM, HTN, HLD, Hx of Stroke, Anxiety/Depression, GERD, Allergic Rhinitis, Trigeminal Neuralgia   Priority: Medium  Onset Date: 06/19/2020  Note:   Current Barriers:  In Medicare coverage gap and needs assistance with cost of Eliquis Not taking medication according to prescribed directions  Pharmacist Clinical Goal(s):  Over the next 180 days, patient will maintain control of HTN, hyperlipidemia as evidenced by attainment of goals listed below  adhere to plan to optimize therapeutic regimen for hyperlipiemia and hypertension  as evidenced by report of adherence to recommended medication management changes contact provider office for questions/concerns as evidenced notation of same in electronic health record through collaboration with PharmD and provider.   Interventions: 1:1 collaboration with Colon Branch, MD regarding development and update of comprehensive plan of care as evidenced by provider attestation and co-signature Inter-disciplinary care team collaboration (see longitudinal plan of care) Comprehensive medication review performed; medication list updated in electronic medical record   Hypertension BP Readings from Last 3 Encounters:  12/17/20 118/68  12/12/20 118/64  12/10/20 117/65  Currently at goal of BP <130/80 Current regimen:  Amlodipine 56m daily in the afternoon (patient taking in evening) Losartan 1044m- take 1 tablet each morning (per patient he is taking 0.75 tablet or 7559maily) Metoprolol succinate ER 37m80mtake 3 tablets = 75mg63mh morning Amlodipine stopped in September 2022 due to dizziness and low blood pressure; however blood pressure increase and was restarted.  Home blood pressure reading ranges from 110 to 120 / 60's Patient denies dizziness, Denied recent chest pain Interventions: Discussed blood pressure goal Recommended to check blood pressure 2 to 3 times per week, document, and provide at future appointments Ensure daily salt intake <  2300 mg/day Updated prescription for losartan - changed to 37mg 65mets. Patient will take 3 tablets = 75mg o90ma day  Hyperlipidemia  Goal < 70 (history of stroke) Currently at goal Current regimen:  Rosuvastatin 40mg da75mOmega 3 fatty acid - twice daily Coenzyme Q10 - 100 mg  twice a day Interventions: Discussed LDL goal  Continue to take rosuvastatin 74m and Omega 3 fatty acids  Atrial Fibrillation:  Current regimen:  Metoprolol succinate ER 247m- take 3 tablets = 7523maily  Eliquis 5mg2mtake 1 tablet twice a day Patient denies and signs or symptoms of bleeding Benefits for Medicare has restarted for 2023. Patient endorses that since he is out of coverage gap cost of Eliquis is affordable. Interventions: Reviewed Eliquis dosing - no need for dose adjustment Continue to current regimen for atrial fibrillation and stroke prevention Will continue to follow out of pocket cost in 2023 and assist if needed for Eliquis patient assistance program (patient must reach coverage gap and spend 3% of income out of pocket to apply)  Medication management Current pharmacy: [facility] Interventions Comprehensive medication review performed. Continue current medication management strategy Reviewed adherence through fill history.  Patient is reminded and encouraged to report any questions or concerns to PharmD and/or provider  Patient Goals/Self-Care Activities Over the next 180 days, patient will:  take medications as prescribed,  check blood pressure 2 to 3 times per week , document, and provide at future appointments Change to losartan 25mg18mlets - take 2 tablets = 75mg 42my collaborate with provider on medication access solutions  Follow Up Plan: Telephone follow up appointment with care management team member scheduled for: 3 months.       Medication Assistance:  patient completed patient assistance program applicaton for Eliquis in August 2022 but has not met 3% OOP spend  yet. He likely has met that now. Requesting updated 2022 medication cost report from [facility]. Plan to resubmit to Eliquis patient assistance program.   Update: patient was approved for medication assistance program for Eliquis 01/12/2021 through 02/21/2021   [facility]  [id number] [phone]: [phone] [address] [address] [id number]: [phone] Up:  Patient agrees to Care Plan and Follow-up.  Plan: Telephone follow up appointment with care management team member scheduled for:  pharmacist to follow up in 3 months.   Korban Shearer Cherre RobinsmD Clinical Pharmacist [facility]

## 2021-04-08 ENCOUNTER — Other Ambulatory Visit: Payer: Self-pay | Admitting: Internal Medicine

## 2021-04-08 NOTE — Telephone Encounter (Signed)
Prescription refill request for Eliquis received. Indication:Afib Last office visit:10/22 Scr:1.0 Age: 81 Weight:78 kg  Prescription refilled

## 2021-04-21 DIAGNOSIS — I4891 Unspecified atrial fibrillation: Secondary | ICD-10-CM

## 2021-04-21 DIAGNOSIS — I10 Essential (primary) hypertension: Secondary | ICD-10-CM | POA: Diagnosis not present

## 2021-04-21 DIAGNOSIS — E785 Hyperlipidemia, unspecified: Secondary | ICD-10-CM | POA: Diagnosis not present

## 2021-05-08 ENCOUNTER — Encounter: Payer: Self-pay | Admitting: Internal Medicine

## 2021-05-14 DIAGNOSIS — H2513 Age-related nuclear cataract, bilateral: Secondary | ICD-10-CM | POA: Diagnosis not present

## 2021-05-14 DIAGNOSIS — I69398 Other sequelae of cerebral infarction: Secondary | ICD-10-CM | POA: Diagnosis not present

## 2021-05-14 DIAGNOSIS — G500 Trigeminal neuralgia: Secondary | ICD-10-CM | POA: Diagnosis not present

## 2021-05-14 DIAGNOSIS — H524 Presbyopia: Secondary | ICD-10-CM | POA: Diagnosis not present

## 2021-05-14 DIAGNOSIS — H52202 Unspecified astigmatism, left eye: Secondary | ICD-10-CM | POA: Diagnosis not present

## 2021-05-14 DIAGNOSIS — H53461 Homonymous bilateral field defects, right side: Secondary | ICD-10-CM | POA: Diagnosis not present

## 2021-05-14 DIAGNOSIS — H43812 Vitreous degeneration, left eye: Secondary | ICD-10-CM | POA: Diagnosis not present

## 2021-05-14 DIAGNOSIS — H43821 Vitreomacular adhesion, right eye: Secondary | ICD-10-CM | POA: Diagnosis not present

## 2021-05-14 DIAGNOSIS — H35362 Drusen (degenerative) of macula, left eye: Secondary | ICD-10-CM | POA: Diagnosis not present

## 2021-05-14 DIAGNOSIS — H5203 Hypermetropia, bilateral: Secondary | ICD-10-CM | POA: Diagnosis not present

## 2021-06-09 ENCOUNTER — Telehealth: Payer: Self-pay | Admitting: Internal Medicine

## 2021-06-09 NOTE — Telephone Encounter (Signed)
PDMP okay, Rx sent 

## 2021-06-09 NOTE — Telephone Encounter (Signed)
Requesting: alprazolam 0.25mg   ?Contract:12/12/2019 ?UDS: 12/12/2019 ?Last Visit: 12/12/2020 ?Next Visit: None ?Last Refill: 04/02/20 #60 and 0RF ? ?Please Advise ? ?

## 2021-07-06 ENCOUNTER — Ambulatory Visit (INDEPENDENT_AMBULATORY_CARE_PROVIDER_SITE_OTHER): Payer: PPO | Admitting: Pharmacist

## 2021-07-06 DIAGNOSIS — I4891 Unspecified atrial fibrillation: Secondary | ICD-10-CM

## 2021-07-06 DIAGNOSIS — E785 Hyperlipidemia, unspecified: Secondary | ICD-10-CM

## 2021-07-06 DIAGNOSIS — I10 Essential (primary) hypertension: Secondary | ICD-10-CM

## 2021-07-06 NOTE — Chronic Care Management (AMB) (Signed)
? ? ?Chronic Care Management ?Pharmacy Note ? ?07/06/2021 ?Name:  Kevin Bass MRN:  [id number] DOB:  08/17/1940 ? ?Summary:  ?Patient reports most days he is taking meds for hypertension as directed however a few times he has only taking 2 tablets of losartan and metoprolol instead of 3 tablets per day due to low blood pressure around 100 / 55 to 60.  ?He also reports that he is only taking rosuvastatin 22m - 0.5 tablet daily. Reviewed last lipid panel and LDL goal. Encouraged him to restart rosuvastatin 438mdaily.  ?Made appointment to follow up with PCP in July 2023  ? ?Subjective: ?Kevin Bass an [age].o. year old male who is a primary patient of [name], [name].  The CCM team was consulted for assistance with disease management and care coordination needs.   ? ?Engaged with patient by telephone for follow up visit in response to provider referral for pharmacy case management and/or care coordination services.  ? ?Consent to Services:  ?The patient was given information about Chronic Care Management services, agreed to services, and gave verbal consent prior to initiation of services.  Please see initial visit note for detailed documentation.  ? ?Patient Care Team: ?PaColon BranchMD as PCP - General ?TaEvans LanceMD as PCP - Cardiology (Cardiology) ?BoRaynelle BringMD as Consulting Physician (Urology) ?[name] as Referring Physician (Cardiology) ?Radionchenko, YuIsaias CowmanMD as Consulting Physician (Ophthalmology) ?[name] as Consulting Physician (Neurology) ?EcCherre RobinsRPH-CPP (Pharmacist) ? ?Recent office visits: ?12/12/2020 - Internal Med (Dr PaLarose KellsComplete Physical. Patient reported some chest pain. EKG checked. Referred to cardiology. Prescribed NTG as ndded ?11/21/2020 - Internal Med (Dr PaLarose KellsF/U HTN. Continued losartan 10017mach morning, metoprolol succinate 34m46mch morning. Restart amlodipine 5mg 18mthe afternoon.  ?11/20/2020 - Fam Med (Dr WendlNani Ravens [name]) Larose Kellsone Call - re BP. BP was 165/90 since recent med changes. Recommended take amlodipine 5mg f47mnext 2 days and follow up with PCP.  ?11/18/2020 - Fam Med (Dr WendliNani Ravens for HTN. Stopped amlodipine; Increased losartan to 100mg d17m and increased metoprolol ER to 34mg da60m?08/11/2020 - My Chart message - low BP readings. Dr [name] recoLarose Kellsnded holding amlodipine.  ?08/08/2020 - PCP (Dr [name]) - dLarose Kellseased losartan to 50mg or 45mof 100mg tabl46mDecreased metoprolol from 3 tabs to 2.5tabs = 62.5mg daily.31m ?Recent consult visits: ?05/14/2021 - Ophthalmology (Dr TadionchenkGaylord Shih [facility], vitremacular adhesion of right eye, trigeminal neuralgia of right side of face, posterior vitreous detachment of left eye, cataract of both eyes and right homonymous hemianopsia due to cerebral infarctions. Stopped ketorolac OD due to not effective. Continue to use lubricating eye drops 4 times a day. Not recommended to have cataract removal yet.  ?12/25/2020 - Cardio Stress test completed. Noted that results showed low risk ?12/17/2020 - Cardio (Dr Tobb) Seen [place] pain. Ordered stress test. Continue NTG as needed.  ?12/10/2020 - Neuro (Dr Aquino) F/UDelice Leschminal neuralgia. Continue current medications/ Follow up in 8 mos.  ?10/13/2020 - Cardio (Dr Hilty) PhonDebara Pickettl - has not met 3% OOP spend for Eliquis PAP.  ?06/03/2020 - Neuro (Dr Aquino) - fDelice Leschgeminal neuralgia and recurrent strokes. No medication changes noted - continue baclofen and gabapentin for trigeminal neuralgia and Eliquis for recurrent stroke  ?05/07/2020 - [facility]hthal[facility] ([facility] AC drops in right eye - can increase IOP;  ?  Start ketorolac as needed QID OD for pain - safe to use long term. Continue lubricating drops on regular basis ? ?Hospital visits: ?None in previous 6 months ? ?Objective: ? ?Lab Results  ?Component Value Date  ? CREATININE 1.04 11/21/2020  ? CREATININE 1.08 06/11/2020  ?  CREATININE 1.08 12/12/2019  ? ? ?Lab Results  ?Component Value Date  ? HGBA1C 5.4 12/12/2020  ? ?Last diabetic Eye exam: No results found for: HMDIABEYEEXA  ?Last diabetic Foot exam: No results found for: HMDIABFOOTEX  ? ?   ?Component Value Date/Time  ? CHOL 116 12/12/2020 1340  ? TRIG 83 12/12/2020 1340  ? HDL 42 12/12/2020 1340  ? CHOLHDL 2.8 12/12/2020 1340  ? VLDL 25.4 12/11/2018 1101  ? LDLCALC 57 12/12/2020 1340  ? LDLDIRECT 149.1 08/30/2012 1352  ? ? ? ?  Latest Ref Rng & Units 12/12/2020  ?  1:40 PM 08/30/2019  ?  3:08 PM 08/09/2018  ? 10:38 AM  ?Hepatic Function  ?Total Protein 6.5 - 8.1 g/dL  7.2   6.2    ?Albumin 3.5 - 5.0 g/dL  4.1   4.0    ?AST 10 - 35 U/L '19   21   17    ' ?ALT 9 - 46 U/L '16   17   17    ' ?Alk Phosphatase 38 - 126 U/L  47   51    ?Total Bilirubin 0.3 - 1.2 mg/dL  1.0   0.5    ? ? ?Lab Results  ?Component Value Date/Time  ? TSH 2.19 06/11/2020 01:51 PM  ? TSH 1.850 12/03/2017 06:26 AM  ? TSH 1.88 02/25/2017 03:57 PM  ? ? ? ?  Latest Ref Rng & Units 12/12/2020  ?  1:40 PM 06/11/2020  ?  1:51 PM 12/12/2019  ?  1:56 PM  ?CBC  ?WBC 3.8 - 10.8 Thousand/uL 6.6   6.1   5.8    ?Hemoglobin 13.2 - 17.1 g/dL 14.4   13.7   13.2    ?Hematocrit 38.5 - 50.0 % 43.9   40.9   40.2    ?Platelets 140 - 400 Thousand/uL 214   180.0   191    ? ? ?Lab Results  ?Component Value Date/Time  ? VD25OH 39 10/16/2008 08:55 PM  ? ? ?Clinical ASCVD: Yes  ?The ASCVD Risk score (Arnett DK, et al., 2019) failed to calculate for the following reasons: ?  The 2019 ASCVD risk score is only valid for ages 58 to 56 ?  The patient has a prior MI or stroke diagnosis   ? ? ?Social History  ? ?Tobacco Use  ?Smoking Status Former  ? Packs/day: 0.50  ? Years: 20.00  ? Pack years: 10.00  ? Types: Cigarettes  ? Quit date: 02/23/1983  ? Years since quitting: 38.3  ?Smokeless Tobacco Never  ? ?BP Readings from Last 3 Encounters:  ?12/17/20 118/68  ?12/12/20 118/64  ?12/10/20 117/65  ? ?Pulse Readings from Last 3 Encounters:  ?12/17/20 (!) 54   ?12/12/20 (!) 56  ?12/10/20 77  ? ?Wt Readings from Last 3 Encounters:  ?12/25/20 172 lb (78 kg)  ?12/17/20 172 lb 6.4 oz (78.2 kg)  ?12/12/20 171 lb 2 oz (77.6 kg)  ? ? ?Assessment: Review of patient past medical history, allergies, medications, health status, including review of consultants reports, laboratory and other test data, was performed as part of comprehensive evaluation and provision of chronic care management services.  ? ?SDOH:  (Social Determinants of Health) assessments  and interventions performed:  ?SDOH Interventions   ? ?Flowsheet Row Most Recent Value  ?SDOH Interventions   ?Financial Strain Interventions Other (Comment)  [following for 4827 - mailed application but has to reach coverage gap and spend 3% of income OOP before can enroll.]  ? ?  ? ? ? ? ?[place] ? ?Allergies  ?Allergen Reactions  ? Prednisolone Acetate   ?  Increased intraocular pressure  ? Statins Other (See Comments)  ?  Myalgias (intolerance) ?Simvastatin, atorvastatin, pravastatin  ? ? ?Medications Reviewed Today   ? ? Reviewed by [name], RPH-CPP (Pharmacist) on 04/07/21 at 1246  Med List Status: <None>  ? ?Medication Order Taking? Sig Documenting Provider Last Dose Status Informant  ?acetaminophen (TYLENOL) 650 MG CR tablet [id number] Yes Take 1,300 mg by mouth every 8 (eight) hours as needed for pain. [provider] Taking Active Multiple Informants  ?ALPRAZolam (XANAX) 0.25 MG tablet [id number] Yes Take 1 tablet (0.25 mg total) by mouth 2 (two) times daily as needed for anxiety. Colon Branch, MD Taking Active   ?amLODipine (NORVASC) 5 MG tablet [id number] Yes TAKE 1 TABLET(5 MG) BY MOUTH DAILY Colon Branch, MD Taking Active   ?Arginine 500 MG CAPS [id number] Yes Take 1,000 mg by mouth daily.  [provider] Taking Active Multiple Informants  ?Baclofen 5 MG TABS [id number] Yes TAKE 1 TABLET BY MOUTH THREE TIMES DAILY AS NEEDED FOR PAIN Cameron Sprang, MD Taking Active   ?Coenzyme Q10 100 MG TABS  [id number] Yes Take 1 tablet by mouth in the morning and at bedtime. [provider] Taking Active   ?ELIQUIS 5 MG TABS tablet [id number] Yes TAKE 1 TABLET BY MOUTH TWICE DAILY Hilty, Nadean Corwin, MD

## 2021-07-06 NOTE — Patient Instructions (Signed)
Kevin Bass. ?It was a pleasure speaking with you  ?Below is a summary of your health goals and care plan ? ?Patient Goals/Self-Care Activities ?Take medications as prescribed,  ?Check blood pressure daily , document, and provide at future appointments. Contact provider if you have blood pressure readings < 100/60 or > 140/90. ?Take rosuvastatin 40mg  daily ?collaborate with provider on medication access solutions ? ? ?If you have any questions or concerns, please feel free to contact me either at the phone number below or with a MyChart message.  ? ?Keep up the good work! ? ?[name], PharmD ?Clinical Pharmacist ?[facility] ?[place] ?[phone] (direct line)  ?[phone] (main office number) ? ? ?Patient verbalizes understanding of instructions and care plan provided today and agrees to view in [employer]. Active MyChart status confirmed with patient.    ?

## 2021-07-22 DIAGNOSIS — E785 Hyperlipidemia, unspecified: Secondary | ICD-10-CM

## 2021-07-22 DIAGNOSIS — I10 Essential (primary) hypertension: Secondary | ICD-10-CM

## 2021-07-22 DIAGNOSIS — Z87891 Personal history of nicotine dependence: Secondary | ICD-10-CM | POA: Diagnosis not present

## 2021-07-22 DIAGNOSIS — I4891 Unspecified atrial fibrillation: Secondary | ICD-10-CM

## 2021-07-31 DIAGNOSIS — E785 Hyperlipidemia, unspecified: Secondary | ICD-10-CM | POA: Diagnosis not present

## 2021-07-31 DIAGNOSIS — I480 Paroxysmal atrial fibrillation: Secondary | ICD-10-CM | POA: Diagnosis not present

## 2021-07-31 DIAGNOSIS — I69398 Other sequelae of cerebral infarction: Secondary | ICD-10-CM | POA: Diagnosis not present

## 2021-07-31 DIAGNOSIS — Z7901 Long term (current) use of anticoagulants: Secondary | ICD-10-CM | POA: Diagnosis not present

## 2021-07-31 DIAGNOSIS — G629 Polyneuropathy, unspecified: Secondary | ICD-10-CM | POA: Diagnosis not present

## 2021-07-31 DIAGNOSIS — D6869 Other thrombophilia: Secondary | ICD-10-CM | POA: Diagnosis not present

## 2021-07-31 DIAGNOSIS — I10 Essential (primary) hypertension: Secondary | ICD-10-CM | POA: Diagnosis not present

## 2021-07-31 DIAGNOSIS — Z8679 Personal history of other diseases of the circulatory system: Secondary | ICD-10-CM | POA: Diagnosis not present

## 2021-08-11 ENCOUNTER — Other Ambulatory Visit: Payer: Self-pay | Admitting: Neurology

## 2021-08-17 ENCOUNTER — Encounter: Payer: Self-pay | Admitting: Neurology

## 2021-08-17 ENCOUNTER — Ambulatory Visit: Payer: PPO | Admitting: Neurology

## 2021-08-17 VITALS — BP 122/71 | HR 56 | Ht 70.0 in | Wt 174.8 lb

## 2021-08-17 DIAGNOSIS — G629 Polyneuropathy, unspecified: Secondary | ICD-10-CM

## 2021-08-17 DIAGNOSIS — G500 Trigeminal neuralgia: Secondary | ICD-10-CM

## 2021-08-17 DIAGNOSIS — I639 Cerebral infarction, unspecified: Secondary | ICD-10-CM

## 2021-08-17 MED ORDER — GABAPENTIN 300 MG PO CAPS: ORAL_CAPSULE | ORAL | 3 refills | Status: DC

## 2021-08-17 MED ORDER — BACLOFEN 5 MG PO TABS: ORAL_TABLET | ORAL | 3 refills | Status: DC

## 2021-08-24 ENCOUNTER — Encounter: Payer: Self-pay | Admitting: Internal Medicine

## 2021-08-24 ENCOUNTER — Ambulatory Visit (INDEPENDENT_AMBULATORY_CARE_PROVIDER_SITE_OTHER): Payer: PPO | Admitting: Internal Medicine

## 2021-08-24 VITALS — BP 128/78 | HR 62 | Temp 98.0°F | Resp 18 | Ht 70.0 in | Wt 176.4 lb

## 2021-08-24 DIAGNOSIS — I10 Essential (primary) hypertension: Secondary | ICD-10-CM | POA: Diagnosis not present

## 2021-08-24 DIAGNOSIS — E782 Mixed hyperlipidemia: Secondary | ICD-10-CM | POA: Diagnosis not present

## 2021-08-24 DIAGNOSIS — Z79899 Other long term (current) drug therapy: Secondary | ICD-10-CM

## 2021-08-24 DIAGNOSIS — F32A Depression, unspecified: Secondary | ICD-10-CM

## 2021-08-24 DIAGNOSIS — F419 Anxiety disorder, unspecified: Secondary | ICD-10-CM | POA: Diagnosis not present

## 2021-08-24 NOTE — Assessment & Plan Note (Signed)
Prediabetes: Last A1c very good. HTN: BP very good today, ambulatory BPs typically normal, from time to time he skips amlodipine if the BP is too low. Continue monitoring BPs, losartan -Toprol- amlodipine. Check BMP High cholesterol: On Crestor 40 mg half tablet daily.  Check FLP. Cardiovascular: Since the last visit, saw cardiology for chest pain, stress test November 2022 was low risk. He continues to be anticoagulated without apparent problems. Neuro: LOV w/ neurology 08-17-2021  found not to have residual deficit from stroke except mild cognitive impairment. Trigeminal neuralgia and neuropathy were stable. Vaccine advice: Flu and COVID booster recommended. RTC 4 months CPX

## 2021-08-24 NOTE — Progress Notes (Signed)
Subjective:    Patient ID: Kevin Bass, male    DOB: 24-Nov-1940, 81 y.o.   MRN: [id number]  DOS:  08/24/2021 Type of visit - description: F/U  Since the last office visit is doing well. Saw cardiology and neurology, notes reviewed. We review his chronic medical problems.  Review of Systems See above   Past Medical History:  Diagnosis Date   Allergy    Anxiety    Arthritis    hands   Atrial fibrillation ([place])    Colon cancer ([place])    s/p partial colectomy 1990, Cscope neg 7-09, next 2014   DISORDER, MITRAL VALVE    MV recontruction at [place] 2006----still needs ABX for SBE prophylaxis    Elevated PSA    prostate nodule, [name]   GERD (gastroesophageal reflux disease)    Heart murmur    no problems since surgery   HYPERGLYCEMIA, BORDERLINE 10/16/2008   HYPERLIPIDEMIA 06/15/2006   diet controlled   Hypertension    HYPERTROPHY PROSTATE W/O UR OBST & OTH LUTS 10/16/2008   Neuropathy    Right groin pain    Dr. Redmond Pulling   SCC (squamous cell carcinoma) 06/2017   s/p Mohs---[name]   Stroke [facility])    Wears partial dentures    lower partial    Past Surgical History:  Procedure Laterality Date   COLECTOMY  1990   HERNIA REPAIR Left 2014   [facility] rep w/mesh- March 2014   LOOP RECORDER INSERTION N/A 12/01/2016   Procedure: LOOP RECORDER INSERTION;  Surgeon: Evans Lance, MD;  Location: [place] CV LAB;  Service: Cardiovascular;  Laterality: N/A;   MITRAL VALVE ANNULOPLASTY     [facility] 2006   TEE WITHOUT CARDIOVERSION N/A 10/05/2016   Procedure: TRANSESOPHAGEAL ECHOCARDIOGRAM (TEE);  Surgeon: [name], MD;  Location: [facility] ENDOSCOPY;  Service: Cardiovascular;  Laterality: N/A;    Current Outpatient Medications  Medication Instructions   acetaminophen (TYLENOL) 1,300 mg, Oral, Every 8 hours PRN   ALPRAZolam (XANAX) 0.25 MG tablet TAKE 1 TABLET(0.25 MG) BY MOUTH TWICE DAILY AS NEEDED FOR ANXIETY   amLODipine (NORVASC) 5 MG tablet TAKE 1 TABLET(5 MG) BY MOUTH DAILY    Arginine 1,000 mg, Oral, Daily   Baclofen 5 MG TABS TAKE 1 TABLET BY MOUTH THREE TIMES DAILY AS NEEDED FOR PAIN   Coenzyme Q10 100 MG TABS 1 tablet, Oral, 2 times daily   ELIQUIS 5 MG TABS tablet TAKE 1 TABLET BY MOUTH TWICE DAILY   gabapentin (NEURONTIN) 300 MG capsule TAKE 1 CAPSULE BY MOUTH FOUR TIMES DAILY AS DIRECTED. TAKE 1 EVERY MORNING, 1 AT 8:30PM, 9:30 PM, AND 10:00PM   Glucosamine-MSM-Hyaluronic Acd (JOINT HEALTH PO) 1 capsule, Oral, Daily   loratadine (CLARITIN) 10 mg, Oral, Daily at bedtime   losartan (COZAAR) 75 mg, Oral, Daily   metoprolol succinate (TOPROL-XL) 25 MG 24 hr tablet TAKE 3 TABLETS(75 MG) BY MOUTH DAILY   Multiple Vitamins-Minerals (OCUVITE PRESERVISION PO) 1 capsule, Oral, 2 times daily   nitroGLYCERIN (NITROSTAT) 0.4 mg, Sublingual, Every 5 min PRN   NON FORMULARY 1-2 packets, Oral, See admin instructions, Lypo-Spheric vitamin C gel packets- Mix 1-2 packets of gel into juice one to two times a day and drink    NON FORMULARY 10 mg, Oral, See admin instructions, [employer] Lyco-Sorb (for Prostate & Cardiovascular Health) 10 mg capsules: Take 10 mg by mouth two times a day   Omega-3 Fatty Acids (FISH OIL PO) 1 capsule, Oral, 2 times daily  omeprazole (PRILOSEC) 40 MG capsule TAKE 1 CAPSULE(40 MG) BY MOUTH DAILY   rosuvastatin (CRESTOR) 40 MG tablet TAKE 1 TABLET(40 MG) BY MOUTH DAILY   Vitamin D, Cholecalciferol, 25 MCG (1000 UT) TABS 1 tablet, Oral, Daily       Objective:   Physical Exam BP 128/78   Pulse 62   Temp 98 F (36.7 C) (Oral)   Resp 18   Ht 5\' 10"  (1.778 m)   Wt 176 lb 6 oz (80 kg)   SpO2 96%   BMI 25.31 kg/m  General:   Well developed, NAD, BMI noted. HEENT:  Normocephalic . Face symmetric, atraumatic Lungs:  CTA B Normal respiratory effort, no intercostal retractions, no accessory muscle use. Heart: Seems regular today Lower extremities: no pretibial edema bilaterally  Skin: Not pale. Not jaundice Neurologic:  alert &  oriented X3.  Speech normal, gait appropriate for age and unassisted Psych--  Cognition and judgment appear intact.  Cooperative with normal attention span and concentration.  Behavior appropriate. No anxious or depressed appearing.      Assessment     Assessment Prediabetes HTN Hyperlipidemia, statin intolerant (simva-pravachol: shoulder pain) Anxiety- rarely takes xanax Neuro  -- Trigeminal neuralgia  dx 2015, no w/u, sx resurface x1 after temporary d/c of meds  --saw neuro again 02/2017, labs (-), had a NCS, Rx gaba -- strokes CV: --Mitral valve annuloplasty 2006-- needs SBE prophylaxis -- SOB 04-2016, seen at [facility], had a ECHO Nl fx MV, cath: no CAD --Cryptogenic stroke 09/12/2016, had slurred speech. 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 [name] Colon cancer partial colectomy 1990, colonoscopy 08-2007 negative, Cscope again 09-2012 normal, 5 years  SCC . MOHS 06/2017  PLAN: Prediabetes: Last A1c very good. HTN: BP very good today, ambulatory BPs typically normal, from time to time he skips amlodipine if the BP is too low. Continue monitoring BPs, losartan -Toprol- amlodipine. Check BMP High cholesterol: On Crestor 40 mg half tablet daily.  Check FLP. Cardiovascular: Since the last visit, saw cardiology for chest pain, stress test November 2022 was low risk. He continues to be anticoagulated without apparent problems. Neuro: LOV w/ neurology 08-17-2021  found not to have residual deficit from stroke except mild cognitive impairment. Trigeminal neuralgia and neuropathy were stable. Vaccine advice: Flu and COVID booster recommended. RTC 4 months CPX

## 2021-08-24 NOTE — Patient Instructions (Addendum)
Recommend to proceed with covid booster (bivalent) at your pharmacy.  Also recommend the flu shot this fall   Check the  blood pressure regularly BP GOAL is between 110/65 and  135/85. If it is consistently higher or lower, let me know     GO TO THE LAB : Get the blood work     [place], [place] back for a physical exam in 4 months

## 2021-08-25 LAB — BASIC METABOLIC PANEL
BUN: 19 mg/dL (ref 7–25)
CO2: 29 mmol/L (ref 20–32)
Calcium: 9.4 mg/dL (ref 8.6–10.3)
Chloride: 105 mmol/L (ref 98–110)
Creat: 1.02 mg/dL (ref 0.70–1.22)
Glucose, Bld: 91 mg/dL (ref 65–99)
Potassium: 4.9 mmol/L (ref 3.5–5.3)
Sodium: 142 mmol/L (ref 135–146)

## 2021-08-25 LAB — LIPID PANEL
Cholesterol: 111 mg/dL (ref <200)
HDL: 41 mg/dL (ref >=40)
LDL Cholesterol (Calc): 51 mg/dL (calc)
Non-HDL Cholesterol (Calc): 70 mg/dL (calc) (ref <130)
Total CHOL/HDL Ratio: 2.7 (calc) (ref <5.0)
Triglycerides: 111 mg/dL (ref <150)

## 2021-08-25 LAB — EXTRA LAV TOP TUBE

## 2021-08-27 LAB — DRUG MONITORING PANEL 375977 , URINE

## 2021-08-27 LAB — DM TEMPLATE

## 2021-09-14 DIAGNOSIS — K219 Gastro-esophageal reflux disease without esophagitis: Secondary | ICD-10-CM | POA: Insufficient documentation

## 2021-09-21 DIAGNOSIS — I10 Essential (primary) hypertension: Secondary | ICD-10-CM | POA: Diagnosis not present

## 2021-09-22 ENCOUNTER — Telehealth: Payer: Self-pay | Admitting: Internal Medicine

## 2021-09-22 NOTE — Telephone Encounter (Signed)
Nurse Assessment Nurse: Marcelline Mates, RN, Joy Date/Time (Eastern Time): 09/22/2021 4:33:39 PM Confirm and document reason for call. If symptomatic, describe symptoms. ---[name] states that his blood pressure became low yesterday 94/38. Feels his blood pressure has been running low in the afternoons. States his most recent blood pressure was 106/60 about 30 minutes. States he takes metoprolol and losartaan in the morning for his blood pressure management. Denies feeling lightheaded, dizzy or weak. Does the patient have any new or worsening symptoms? ---Yes Will a triage be completed? ---Yes Related visit to physician within the last 2 weeks? ---No Does the PT have any chronic conditions? (i.e. diabetes, asthma, this includes High risk factors for pregnancy, etc.) ---Yes List chronic conditions. ---HTN Is this a behavioral health or substance abuse call? ---No Guidelines Guideline Title Affirmed Question Affirmed Notes Nurse Date/Time (Eastern Time) Blood Pressure - Low [9] Systolic BP 24-268 AND [3] taking blood pressure medications AND [3] NOT dizzy, lightheaded or weak Eurie, RN, Joy 09/22/2021 4:37:53 PM PLEASE NOTE: All timestamps contained within this report are represented as [place] Standard Time. CONFIDENTIALTY NOTICE: This fax transmission is intended only for the addressee. It contains information that is legally privileged, confidential or otherwise protected from use or disclosure. If you are not the intended recipient, you are strictly prohibited from reviewing, disclosing, copying using or disseminating any of this information or taking any action in reliance on or regarding this information. If you have received this fax in error, please notify us immediately by telephone so that we can arrange for its return to [place]. Phone: [phone], Toll-Free: [phone], Fax: [phone] Page: 2 of 2 Call Id: [id number] [place]. Time Eilene Ghazi Time) Disposition Final User 09/22/2021 4:45:54  PM See PCP within 24 Hours Yes Marcelline Mates, RN, Joy Final Disposition 09/22/2021 4:45:54 PM See PCP within 24 Hours Yes Marcelline Mates, RN, Joy [name] Disagree/Comply Comply [name] Understands Yes PreDisposition Call Doctor Care Advice Given Per Guideline SEE PCP WITHIN 24 HOURS: CALL BACK IF: * Lightheadedness, weakness, or dizziness occurs * Systolic BP under 90 * You feel sick * You become worse CARE ADVICE given per Low Blood Pressure (Adult) guideline. Referrals REFERRED TO PCP OFFICE

## 2021-09-22 NOTE — Telephone Encounter (Signed)
Pt called stating he was experiencing the following symtoms:  -94/48 BP reading on 7.31.23  -Dizziness  -Headache  Pt was transferred to triage nurse for further eval.

## 2021-09-22 NOTE — Telephone Encounter (Signed)
Appt scheduled 09/23/21

## 2021-09-23 ENCOUNTER — Ambulatory Visit (INDEPENDENT_AMBULATORY_CARE_PROVIDER_SITE_OTHER): Payer: PPO | Admitting: Internal Medicine

## 2021-09-23 ENCOUNTER — Encounter: Payer: Self-pay | Admitting: Internal Medicine

## 2021-09-23 VITALS — BP 126/68 | HR 58 | Temp 97.8°F | Resp 18 | Ht 70.0 in | Wt 175.1 lb

## 2021-09-23 DIAGNOSIS — I10 Essential (primary) hypertension: Secondary | ICD-10-CM | POA: Diagnosis not present

## 2021-09-23 LAB — CBC WITH DIFFERENTIAL/PLATELET
Basophils Absolute: 0 10*3/uL (ref 0.0–0.1)
Basophils Relative: 0.5 % (ref 0.0–3.0)
Eosinophils Absolute: 0.1 10*3/uL (ref 0.0–0.7)
Eosinophils Relative: 2.5 % (ref 0.0–5.0)
HCT: 41.9 % (ref 39.0–52.0)
Hemoglobin: 14.1 g/dL (ref 13.0–17.0)
Lymphocytes Relative: 30.9 % (ref 12.0–46.0)
Lymphs Abs: 1.8 10*3/uL (ref 0.7–4.0)
MCHC: 33.6 g/dL (ref 30.0–36.0)
MCV: 84.1 fl (ref 78.0–100.0)
Monocytes Absolute: 0.4 10*3/uL (ref 0.1–1.0)
Monocytes Relative: 6.8 % (ref 3.0–12.0)
Neutro Abs: 3.4 10*3/uL (ref 1.4–7.7)
Neutrophils Relative %: 59.3 % (ref 43.0–77.0)
Platelets: 181 10*3/uL (ref 150.0–400.0)
RBC: 4.99 Mil/uL (ref 4.22–5.81)
RDW: 13.7 % (ref 11.5–15.5)
WBC: 5.8 10*3/uL (ref 4.0–10.5)

## 2021-09-23 LAB — TSH: TSH: 2.12 u[IU]/mL (ref 0.35–5.50)

## 2021-09-23 MED ORDER — METOPROLOL SUCCINATE ER 25 MG PO TB24
50.0000 mg | ORAL_TABLET | Freq: Every day | ORAL | Status: DC
Start: 2021-09-23 — End: 2021-11-25

## 2021-09-23 MED ORDER — LOSARTAN POTASSIUM 25 MG PO TABS: 50.0000 mg | ORAL_TABLET | Freq: Every day | ORAL | Status: DC

## 2021-09-23 NOTE — Assessment & Plan Note (Signed)
HTN: BP has been running low. Patient stopped amlodipine about a month ago For the last week BP has been in the low side mostly around 4 PM. He self decrease metoprolol dose to 50 mg qd 2 days ago, yet BP was low yesterday. Plan: Check CBC and TSH Decrease losartan 25 mg to two tablets qd Continue with low-dose of metoprolol 2 tablets daily He already stopped amlodipine. Monitor BPs, call with readings. Further advised with results and readings.  Also advised patient that we may need to tolerate a slightly elevated blood pressure up to the 140s to prevent hypotension which can lead to falls and other problems.  He verbalized understanding. Follow-up scheduled for October.

## 2021-09-23 NOTE — Patient Instructions (Addendum)
Decrease losartan 25 mg to only 2 tablets in the morning Continue metoprolol 25 mg: 2 tablets a day   Check the  blood pressure regularly BP GOAL is between 110/65 and  140/85. Let me know next week how that is working for you.   GO TO THE LAB : Get the blood work    Next visit is scheduled for October, come back sooner if needed   Recommend to proceed with covid booster (bivalent) at your pharmacy. Flu shot this fall

## 2021-09-23 NOTE — Progress Notes (Signed)
Subjective:    Patient ID: Kevin Bass, male    DOB: 1940-09-26, 81 y.o.   MRN: [id number]  DOS:  09/23/2021 Type of visit - description: Acute visit  Patient called yesterday, his blood pressure was low.  He reports that for the last week, his BP has been in the 90s mostly in the afternoon at 4 PM. He already stopped amlodipine about a month ago. He also decrease metoprolol XL from 75 to 50 mg about a week ago. Despite above, BPs are still in the low side.  Denies feeling dizzy or weak. No nausea or vomiting.  No blood in the stools. No chest pain or difficulty breathing No sweats or diaphoresis.  Review of Systems See above   Past Medical History:  Diagnosis Date   Allergy    Anxiety    Arthritis    hands   Atrial fibrillation ([place])    Colon cancer ([place])    s/p partial colectomy 1990, Cscope neg 7-09, next 2014   DISORDER, MITRAL VALVE    MV recontruction at [facility] 2006----still needs ABX for SBE prophylaxis    Elevated PSA    prostate nodule, [name]   GERD (gastroesophageal reflux disease)    Heart murmur    no problems since surgery   HYPERGLYCEMIA, BORDERLINE 10/16/2008   HYPERLIPIDEMIA 06/15/2006   diet controlled   Hypertension    HYPERTROPHY PROSTATE W/O UR OBST & OTH LUTS 10/16/2008   Neuropathy    Right groin pain    Dr. Redmond Pulling   SCC (squamous cell carcinoma) 06/2017   s/p Mohs---[name]   Stroke [facility])    Wears partial dentures    lower partial    Past Surgical History:  Procedure Laterality Date   COLECTOMY  1990   HERNIA REPAIR Left 2014   [facility] rep w/mesh- March 2014   LOOP RECORDER INSERTION N/A 12/01/2016   Procedure: LOOP RECORDER INSERTION;  Surgeon: Evans Lance, MD;  Location: [place];  Service: Cardiovascular;  Laterality: N/A;   MITRAL VALVE ANNULOPLASTY     [facility] 2006   TEE WITHOUT CARDIOVERSION N/A 10/05/2016   Procedure: TRANSESOPHAGEAL ECHOCARDIOGRAM (TEE);  Surgeon: [name], MD;  Location: [facility] ENDOSCOPY;   Service: Cardiovascular;  Laterality: N/A;    Current Outpatient Medications  Medication Instructions   acetaminophen (TYLENOL) 1,300 mg, Oral, Every 8 hours PRN   ALPRAZolam (XANAX) 0.25 MG tablet TAKE 1 TABLET(0.25 MG) BY MOUTH TWICE DAILY AS NEEDED FOR ANXIETY   Arginine 1,000 mg, Oral, Daily   Baclofen 5 MG TABS TAKE 1 TABLET BY MOUTH THREE TIMES DAILY AS NEEDED FOR PAIN   Coenzyme Q10 100 MG TABS 1 tablet, Oral, 2 times daily   ELIQUIS 5 MG TABS tablet TAKE 1 TABLET BY MOUTH TWICE DAILY   gabapentin (NEURONTIN) 300 MG capsule TAKE 1 CAPSULE BY MOUTH FOUR TIMES DAILY AS DIRECTED. TAKE 1 EVERY MORNING, 1 AT 8:30PM, 9:30 PM, AND 10:00PM   Glucosamine-MSM-Hyaluronic Acd (JOINT HEALTH PO) 1 capsule, Oral, Daily   loratadine (CLARITIN) 10 mg, Oral, Daily at bedtime   losartan (COZAAR) 50 mg, Oral, Daily   metoprolol succinate (TOPROL-XL) 50 mg, Oral, Daily   Multiple Vitamins-Minerals (OCUVITE PRESERVISION PO) 1 capsule, Oral, 2 times daily   nitroGLYCERIN (NITROSTAT) 0.4 mg, Sublingual, Every 5 min PRN   NON FORMULARY 1-2 packets, Oral, See admin instructions, Lypo-Spheric vitamin C gel packets- Mix 1-2 packets of gel into juice one to two times a day and drink  NON FORMULARY 10 mg, Oral, See admin instructions, [employer] Lyco-Sorb (for Prostate & Cardiovascular Health) 10 mg capsules: Take 10 mg by mouth two times a day   Omega-3 Fatty Acids (FISH OIL PO) 1 capsule, Oral, 2 times daily   omeprazole (PRILOSEC) 40 MG capsule TAKE 1 CAPSULE(40 MG) BY MOUTH DAILY   rosuvastatin (CRESTOR) 40 MG tablet TAKE 1 TABLET(40 MG) BY MOUTH DAILY   Vitamin D, Cholecalciferol, 25 MCG (1000 UT) TABS 1 tablet, Oral, Daily       Objective:   Physical Exam BP 126/68   Pulse (!) 58   Temp 97.8 F (36.6 C) (Oral)   Resp 18   Ht 5\' 10"  (1.778 m)   Wt 175 lb 2 oz (79.4 kg)   SpO2 93%   BMI 25.13 kg/m  General:   Well developed, NAD, BMI noted. HEENT:  Normocephalic . Face symmetric,  atraumatic Lungs:  CTA B Normal respiratory effort, no intercostal retractions, no accessory muscle use. Heart: RRR,  no murmur.  Lower extremities: no pretibial edema bilaterally  Skin: Not pale. Not jaundice Neurologic:  alert & oriented X3.  Speech normal, gait appropriate for age and unassisted Psych--  Cognition and judgment appear intact.  Cooperative with normal attention span and concentration.  Behavior appropriate. No anxious or depressed appearing.      Assessment     Assessment Prediabetes HTN Hyperlipidemia, statin intolerant (simva-pravachol: shoulder pain) Anxiety- rarely takes xanax Neuro  -- Trigeminal neuralgia  dx 2015, no w/u, sx resurface x1 after temporary d/c of meds  --saw neuro again 02/2017, labs (-), had a NCS, Rx gaba -- strokes CV: --Mitral valve annuloplasty 2006-- needs SBE prophylaxis -- SOB 04-2016, seen at [facility], had a ECHO Nl fx MV, cath: no CAD --Cryptogenic stroke 09/12/2016, had slurred speech. 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 [name] Colon cancer partial colectomy 1990, colonoscopy 08-2007 negative, Cscope again 09-2012 normal, 5 years  SCC . MOHS 06/2017  PLAN: HTN: BP has been running low. Patient stopped amlodipine about a month ago For the last week BP has been in the low side mostly around 4 PM. He self decrease metoprolol dose to 50 mg qd 2 days ago, yet BP was low yesterday. Plan: Check CBC and TSH Decrease losartan 25 mg to two tablets qd Continue with low-dose of metoprolol 2 tablets daily He already stopped amlodipine. Monitor BPs, call with readings. Further advised with results and readings.  Also advised patient that we may need to tolerate a slightly elevated blood pressure up to the  140s to prevent hypotension which can lead to falls and other problems.  He verbalized understanding. Follow-up scheduled for October.

## 2021-09-25 ENCOUNTER — Encounter: Payer: Self-pay | Admitting: Internal Medicine

## 2021-10-01 ENCOUNTER — Telehealth: Payer: Self-pay | Admitting: Internal Medicine

## 2021-10-01 NOTE — Telephone Encounter (Signed)
Provider portion of eliquis assistance application completed by [employer] MD Patient called/notified ready for pick up

## 2021-10-02 DIAGNOSIS — Z8679 Personal history of other diseases of the circulatory system: Secondary | ICD-10-CM | POA: Diagnosis not present

## 2021-10-02 DIAGNOSIS — I10 Essential (primary) hypertension: Secondary | ICD-10-CM | POA: Diagnosis not present

## 2021-10-06 ENCOUNTER — Ambulatory Visit (INDEPENDENT_AMBULATORY_CARE_PROVIDER_SITE_OTHER): Payer: PPO | Admitting: Pharmacist

## 2021-10-06 DIAGNOSIS — E782 Mixed hyperlipidemia: Secondary | ICD-10-CM

## 2021-10-06 DIAGNOSIS — I4891 Unspecified atrial fibrillation: Secondary | ICD-10-CM

## 2021-10-06 DIAGNOSIS — I10 Essential (primary) hypertension: Secondary | ICD-10-CM

## 2021-10-06 MED ORDER — ROSUVASTATIN CALCIUM 20 MG PO TABS: 20.0000 mg | ORAL_TABLET | Freq: Every day | ORAL | 3 refills | Status: DC

## 2021-10-06 NOTE — Patient Instructions (Signed)
Mr. Patchin It was a pleasure speaking with you today.  Below is a summary of your health goals and summary of our recent visit. You can also view your updated Chronic Care Management Care plan through your MyChart account.   Patient Goals/Self-Care Activities Over the next 180 days, patient will:  Take medications as prescribed,  Check blood pressure daily , document, and provide at future appointments Take rosuvastatin 20mg  daily - I have sent in prescription for 20mg  strength - you will take 1 tablet daily.  Send copy of insurance estimation of benefits to Eliquis medication assistance program or request report from [facility] for you 2023 medication expenses.  collaborate with provider on medication access solutions  Follow Up Plan: Telephone follow up appointment with care management team member scheduled for: 1 to 2 months.     As always if you have any questions or concerns especially regarding medications, please feel free to contact me either at the phone number below or with a MyChart message.   Keep up the good work!  [name], PharmD Clinical Pharmacist [place] [phone] (direct line)  [phone] (main office number)   Patient verbalizes understanding of instructions and care plan provided today and agrees to view in [place]. Active MyChart status and patient understanding of how to access instructions and care plan via MyChart confirmed with patient.

## 2021-10-06 NOTE — Chronic Care Management (AMB) (Signed)
Chronic Care Management Pharmacy Note  10/06/2021 Name:  Kevin Bass MRN:  [id number] DOB:  [birth year]  Summary:  Patient has stated taking losartan 58m 2.5 tablets daily and metoprolol Er 227m2.5 tablets daily. Reports blood pressure has been better with less lows and highs. He has only been on this dose for about 1 week so will not change dose at pharmacy yet. (Pharmacy has dose of 3 tablet daily for losartan and metoprolol) He also reports that he is only taking rosuvastatin 4082m 0.5 tablet daily. Reviewed last lipid panel and LDL goal. Updated prescription at pharmacy for rosuvastatin 34m58mily - patient aware of new strength.  Reminded patient that he will need to show proof of his 2023 out of pocket pharmacy spend for Eliquis medication assistance program application. He will forward information to medication assistance program.   Subjective: Kevin Goynesan 81 y38. year old male who is a primary patient of [name], JoseAlda Berthold.  The CCM team was consulted for assistance with disease management and care coordination needs.    Engaged with patient by telephone for follow up visit in response to provider referral for pharmacy case management and/or care coordination services.   Consent to Services:  The patient was given information about Chronic Care Management services, agreed to services, and gave verbal consent prior to initiation of services.  Please see initial visit note for detailed documentation.   Patient Care Team: [name],Colon Branch as PCP - General TaylLovena LegChamp Mungo as PCP - Cardiology (Cardiology) BordRaynelle Bring as Consulting Physician (Urology) YeboValaria Good as Referring Physician (Cardiology) RadiAntionette FairyiIsaias Cowman as Consulting Physician (Ophthalmology) AquiCameron Sprang as Consulting Physician (Neurology) EckaCherre RobinsH-Wisemanarmacist)  Recent office visits: 09/25/2021 - Int Med - Phone Call. Patient reports since med changes at  appt 08/02/202 BP has been 155/85 He restarted losartan 75mg61mly and metoprolol 75mg 79my and BP on date of call was 142/81 and 110/46 with HR  71, 61. He restarted  09/23/2021 - Int Med (Dr [name]) SLarose Kells for low BP at home. Patient had stopped amlodipine and decreased metoprolol form 75mg d37m to 50mg ab53m1 weeks ago. Recommended decrease losartan to 25mg - 216mlets = 50mg dail80md continue lower dose of metoprool 25mg 2 tab32m50mg daily 68m3/2023 - Int Med (Dr [name]) Seen foLarose Kellsollow up chronic conditions. Labs checked. No med changes noted.   Recent consult visits: 09/14/2021 - Otolaryngology (Dr Rosen) seen Constance Holsteraryngopharyngeal reflux. Recommended reduce caffeine; avoid tobacco, chocolate, alcohol and peppermint. No med changes noted.  08/17/2021 - Neuro (Dr Aquino) SeenDelice Leschrigeminal neuralgia. No med changes - continue gabapentin and baclofen.  05/14/2021 - Ophthalmology (Dr TadionchenkoGaylord ShihWFBH) [facility] left macula, vitremacular adhesion of right eye, trigeminal neuralgia of right side of face, posterior vitreous detachment of left eye, cataract of both eyes and right homonymous hemianopsia due to cerebral infarctions. Stopped ketorolac OD due to not effective. Continue to use lubricating eye drops 4 times a day. Not recommended to have cataract removal yet.    Hospital visits: None in previous 6 months  Objective:  Lab Results  Component Value Date   CREATININE 1.02 08/24/2021   CREATININE 1.04 11/21/2020   CREATININE 1.08 06/11/2020    Lab Results  Component Value Date   HGBA1C 5.4 12/12/2020   Last diabetic Eye exam: No results found for: "HMDIABEYEEXA"  Last diabetic  Foot exam: No results found for: "HMDIABFOOTEX"      Component Value Date/Time   CHOL 111 08/24/2021 1352   TRIG 111 08/24/2021 1352   HDL 41 08/24/2021 1352   CHOLHDL 2.7 08/24/2021 1352   VLDL 25.4 12/11/2018 1101   LDLCALC 51 08/24/2021 1352   LDLDIRECT 149.1 08/30/2012  1352       Latest Ref Rng & Units 12/12/2020    1:40 PM 08/30/2019    3:08 PM 08/09/2018   10:38 AM  Hepatic Function  Total Protein 6.5 - 8.1 g/dL  7.2  6.2   Albumin 3.5 - 5.0 g/dL  4.1  4.0   AST 10 - 35 U/L _0 ALT 9 - 46 U/L _1 Alk Phosphatase 38 - 126 U/L  47  51   Total Bilirubin 0.3 - 1.2 mg/dL  1.0  0.5     Lab Results  Component Value Date/Time   TSH 2.12 09/23/2021 01:28 PM   TSH 2.19 06/11/2020 01:51 PM       Latest Ref Rng & Units 09/23/2021    1:28 PM 12/12/2020    1:40 PM 06/11/2020    1:51 PM  CBC  WBC 4.0 - 10.5 K/uL 5.8  6.6  6.1   Hemoglobin 13.0 - 17.0 g/dL 14.1  14.4  13.7   Hematocrit 39.0 - 52.0 % 41.9  43.9  40.9   Platelets 150.0 - 400.0 K/uL 181.0  214  180.0     Lab Results  Component Value Date/Time   VD25OH 39 10/16/2008 08:55 PM    Clinical ASCVD: Yes  The ASCVD Risk score (Arnett DK, et al., 2019) failed to calculate for the following reasons:   The 2019 ASCVD risk score is only valid for ages 75 to 36   The patient has a prior MI or stroke diagnosis     Social History   Tobacco Use  Smoking Status Former   Packs/day: 0.50   Years: 20.00   Total pack years: 10.00   Types: Cigarettes   Quit date: 02/23/1983   Years since quitting: 38.6  Smokeless Tobacco Never   BP Readings from Last 3 Encounters:  09/23/21 126/68  08/24/21 128/78  08/17/21 122/71   Pulse Readings from Last 3 Encounters:  09/23/21 (!) 58  08/24/21 62  08/17/21 (!) 56   Wt Readings from Last 3 Encounters:  09/23/21 175 lb 2 oz (79.4 kg)  08/24/21 176 lb 6 oz (80 kg)  08/17/21 174 lb 12.8 oz (79.3 kg)    Assessment: Review of patient past medical history, allergies, medications, health status, including review of consultants reports, laboratory and other test data, was performed as part of comprehensive evaluation and provision of chronic care management services.   SDOH:  (Social Determinants of Health) assessments and interventions  performed:  SDOH Interventions    Flowsheet Row Most Recent Value  SDOH Interventions   Financial Strain Interventions Other (Comment)  [patient has completed MAP for ELiqu[employer] and faxed. Reminded him to send report of 2023 OOP expence for medications.]  Physical Activity Interventions Other (Comments)  [Plans to incresae frequency of walking when weather is cooler.]          CCM Care Plan  Allergies  Allergen Reactions   Prednisolone Acetate     Increased intraocular pressure   Statins Other (See Comments)    Myalgias (intolerance) Simvastatin, atorvastatin, pravastatin    Medications Reviewed Today  Reviewed by [name], RPH-CPP (Pharmacist) on 10/06/21 at [place] List Status: <None>   Medication Order Taking? Sig Documenting Provider Last Dose Status Informant  acetaminophen (TYLENOL) 650 MG CR tablet [id number]  Take 1,300 mg by mouth every 8 (eight) hours as needed for pain. [provider]  Active Multiple Informants  ALPRAZolam (XANAX) 0.25 MG tablet [id number] Yes TAKE 1 TABLET(0.25 MG) BY MOUTH TWICE DAILY AS NEEDED FOR ANXIETY Colon Branch, MD Taking Active   Arginine 500 MG CAPS [id number] Yes Take 1,000 mg by mouth daily.  [provider] Taking Active Multiple Informants  Baclofen 5 MG TABS [id number] Yes TAKE 1 TABLET BY MOUTH THREE TIMES DAILY AS NEEDED FOR PAIN Cameron Sprang, MD Taking Active   Coenzyme Q10 100 MG TABS [id number]  Take 1 tablet by mouth in the morning and at bedtime. [provider]  Active   ELIQUIS 5 MG TABS tablet [id number] Yes TAKE 1 TABLET BY MOUTH TWICE DAILY Hilty, Nadean Corwin, MD Taking Active   gabapentin (NEURONTIN) 300 MG capsule [id number] Yes TAKE 1 CAPSULE BY MOUTH FOUR TIMES DAILY AS DIRECTED. TAKE 1 EVERY MORNING, 1 AT 8:30PM, 9:30 PM, AND 10:00PM Cameron Sprang, MD Taking Active   Glucosamine-MSM-Hyaluronic Acd ([place]) [id number] Yes Take 1 capsule by mouth daily.  [provider]  Taking Active Multiple Informants  loratadine (CLARITIN) 10 MG tablet [id number] Yes Take 10 mg by mouth at bedtime. [provider] Taking Active Multiple Informants           Med Note Pilar Plate Sep 12, 2016 11:40 AM)    losartan (COZAAR) 25 MG tablet [id number] Yes Take 2 tablets (50 mg total) by mouth daily.  Patient taking differently: Take 62.2 mg by mouth daily.   Colon Branch, MD Taking Active   metoprolol succinate (TOPROL-XL) 25 MG 24 hr tablet [id number] Yes Take 2 tablets (50 mg total) by mouth daily.  Patient taking differently: Take 62.2 mg by mouth daily.   Colon Branch, MD Taking Active   Multiple Vitamins-Minerals (OCUVITE PRESERVISION PO) [id number] Yes Take 1 capsule by mouth 2 (two) times daily.  [provider] Taking Active Multiple Informants  nitroGLYCERIN (NITROSTAT) 0.4 MG SL tablet [id number] No Place 1 tablet (0.4 mg total) under the tongue every 5 (five) minutes as needed for chest pain.  Patient not taking: Reported on 08/24/2021   Colon Branch, MD Not Taking Active            Med Note [name], [place] Aug 24, 2021  1:11 PM) PRN  Baruch Gouty [id number] Yes Take 1-2 packets by mouth See admin instructions. Lypo-Spheric vitamin C gel packets- Mix 1-2 packets of gel into juice one to two times a day and drink [provider] Taking Active Multiple Informants  NON FORMULARY [id number] Yes Take 10 mg by mouth See admin instructions. [employer] Lyco-Sorb (for Prostate & Cardiovascular Health) 10 mg capsules: Take 10 mg by mouth two times a day [provider] Taking Active Multiple Informants  Omega-3 Fatty Acids (FISH OIL PO) [id number] Yes Take 1 capsule by mouth in the morning and at bedtime. [provider] Taking Active Multiple Informants  omeprazole (PRILOSEC) 40 MG capsule [id number] Yes TAKE 1 CAPSULE(40 MG) BY MOUTH DAILY Colon Branch, MD Taking Active   rosuvastatin (CRESTOR) 40 MG tablet [id number] Yes  TAKE 1 TABLET(40 MG) BY MOUTH DAILY  Patient taking  differently: Take 20 mg by mouth daily.   Colon Branch, MD Taking Active            Med Note Barbaraann Boys Jul 06, 2021  1:29 PM) Patient is taking half dose - changed dose on his own.  Vitamin D, Cholecalciferol, 25 MCG (1000 UT) TABS [id number] Yes Take 1 tablet by mouth daily. [provider] Taking Active             Patient Active Problem List   Diagnosis Date Noted   Laryngopharyngeal reflux (LPR) 09/14/2021   Chronic rhinitis 03/29/2019   TIA (transient ischemic attack) 12/03/2017   Chronic anticoagulation 12/03/2017   H/O: stroke 12/03/2017   Paroxysmal atrial fibrillation ([place]) 07/11/2017   SCC (squamous cell carcinoma) 06/22/2017   Varicose veins of both lower extremities 06/19/2017   Bilateral lower extremity edema 06/19/2017   Trichiasis without entropion of right lower eyelid 04/14/2017   Paresthesias 03/03/2017   Trigeminal neuralgia of right side of face 10/01/2016   History of mitral valve repair 09/30/2016   Cerebrovascular accident (CVA) ([place]) 09/12/2016   GERD (gastroesophageal reflux disease) 09/22/2015   PCP NOTES >>>>> 11/08/2014   Chest pain 06/07/2014   Facial neuropathy 04/02/2013   Anxiety and depression 02/26/2013   Annual physical exam 08/30/2012   HTN (hypertension) 01/22/2011   BPH (benign prostatic hyperplasia) 10/16/2008   Hyperglycemia 10/16/2008   Hyperlipidemia 06/15/2006   Mitral valve disease 06/15/2006   Allergic rhinitis 06/15/2006   COLON CANCER, HX OF 06/15/2006    Immunization History  Administered Date(s) Administered   Fluad Quad(high Dose 65+) 11/13/2018, 12/12/2020   Influenza Whole 03/10/2007, 11/17/2007   Influenza-Unspecified 01/17/2017   PFIZER(Purple Top)SARS-COV-2 Vaccination 04/17/2019, 05/08/2019, 01/02/2020   Pneumococcal Conjugate-13 01/16/2014   Pneumococcal Polysaccharide-23 01/12/2005, 04/21/2010, 06/11/2020   Td 06/15/2006, 01/05/2015    Tdap 12/13/2016   Zoster Recombinat (Shingrix) 02/03/2017, 12/14/2018   Zoster, Live 03/25/1998   Conditions to be addressed/monitored: Atrial Fibrillation, CAD, HTN, HLD, Anxiety and trigeminal neuralgia with ocular effects; BPH; pre DM; h/o recurrent stroke   Care Plan : General Pharmacy (Adult)  Updates made by [name], RPH-CPP since 10/06/2021 12:00 AM     Problem: Chronic Disease Management support, education, and care coordination needs related to  AFib, Pre-DM, HTN, HLD, Hx of Stroke, Anxiety/Depression, GERD, Allergic Rhinitis, Trigeminal Neuralgia   Priority: Medium  Onset Date: 06/19/2020  Note:   Current Barriers:  When in Medicare coverage gap, needs assistance with cost of Eliquis Not taking medication according to prescribed directions  Pharmacist Clinical Goal(s):  Over the next 180 days, patient will maintain control of HTN, hyperlipidemia as evidenced by attainment of goals listed below  adhere to plan to optimize therapeutic regimen for hyperlipiemia and hypertension  as evidenced by report of adherence to recommended medication management changes contact provider office for questions/concerns as evidenced notation of same in electronic health record through collaboration with PharmD and provider.   Interventions: 1:1 collaboration with Colon Branch, MD regarding development and update of comprehensive plan of care as evidenced by provider attestation and co-signature Inter-disciplinary care team collaboration (see longitudinal plan of care) Comprehensive medication review performed; medication list updated in electronic medical record   Hypertension BP Readings from Last 3 Encounters:  09/23/21 126/68  08/24/21 128/78  08/17/21 122/71  Currently at goal of BP <130/80 Current regimen:  Losartan 5m - take 2 tablets = 568mdaily - but patient is taking 2.5 tablets = 62.62m38m  daily  Metoprolol succinate ER 8m - take 2 tablets = 559meach morning - but  patient is taking 2.5 tablets = 62.25m225maily  Amlodipine stopped in September 2022 and again in 2023 due to dizziness and low blood pressure;  Home blood pressure reading ranges from 100 to 135 / 65 to 75 Patient denies dizziness or recent chest pain Interventions: Discussed blood pressure goal Recommended to check blood pressure daily, document, and provide at future appointments Ensure daily salt intake < 2300 mg/day Continue current therapy for hypertension (will hold off on updating prescriptions for losartan and metoprolol as patient's dose has changed twice in the last 1 to 2 months.  Hyperlipidemia  Goal LDL < 70 (history of stroke) Currently LDL at goal Current regimen:  Rosuvastatin 90m58mily (patient reported today he is only taking 0.5 tablet daily) Omega 3 fatty acid - twice daily Coenzyme Q10 - 100 mg twice a day Interventions: Discussed LDL goal  Recommended continue Omega 3 fatty acids Changed rosuvastatin to 20mg8mly since LDL at goal with this dose. Updated prescription sent to pharmacy for 20mg 75mets - daily  Atrial Fibrillation:  Current regimen:  Metoprolol succinate ER 25mg -78me 2 tablets = 50mg da50m- but patient is taking 2.5 tablets = 62.25mg dail45mEliquis 5mg - tak74m tablet twice a day Patient denies and signs or symptoms of bleeding Benefits for Medicare has restarted for 2023. Patient has reached Coverage gap  Interventions: Reviewed Eliquis dosing - no need for dose adjustment Continue to current regimen for atrial fibrillation and stroke prevention Patient has completed 2023 medication assistance program application for Eliquis. Dr Hilty has Debara Pickettd and has been faxed by patient to medication assistance program. Reminded patient that he needed to included 2023 out of pocket for medications. He states he has EOB at home and will fax to medication assistance program.    Medication management Current pharmacy: [facility] Interventions Comprehensive  medication review performed. Continue current medication management strategy Reviewed adherence through fill history - patient has filled medications on time even though he reports that he has not been taking rosuvastatin as prescribed. Recommended restart rosuvastatin 90mg daily41mtient is reminded and encouraged to report any questions or concerns to PharmD and/or provider  Patient Goals/Self-Care Activities Over the next 180 days, patient will:  Take medications as prescribed,  Check blood pressure daily , document, and provide at future appointments Take rosuvastatin 20mg daily 86mhave sent in prescription for 20mg strengt725myou will take 1 tablet daily.  Send copy of insurance estimation of benefits to Eliquis medication assistance program or [facility]  collaborate with provider on medication access solutions  Follow Up Plan: Telephone follow up appointment with care management team member scheduled for: 1 to 2 months.         Medication Assistance:  patient completed patient assistance program applicaton for Eliquis in August 2022 but has not met 3% OOP spend yet. He likely has met that now. Requesting updated 2022 medication cost report from [facility]. Plan to resubmit to Eliquis patient assistance program.   Update: patient was approved for medication assistance program for Eliquis 01/12/2021 through 02/21/2021   [id number] [facility] [address] [phone] [place]  [place]) [place] [address] Phone: [phone] Fax: [phone]    Follow Up:  Patient agrees to Care Plan and Follow-up.  Plan: Telephone follow up appointment with care management team member scheduled for:  2 to 3 weeks to  check patient assistance program application.     [name], PharmD Clinical Pharmacist [facility]

## 2021-10-16 ENCOUNTER — Telehealth: Payer: PPO

## 2021-10-21 ENCOUNTER — Ambulatory Visit: Payer: PPO | Admitting: Pharmacist

## 2021-10-21 DIAGNOSIS — I10 Essential (primary) hypertension: Secondary | ICD-10-CM

## 2021-10-21 DIAGNOSIS — I4891 Unspecified atrial fibrillation: Secondary | ICD-10-CM

## 2021-10-21 NOTE — Patient Instructions (Signed)
Kevin Bass It was a pleasure speaking with you today.  Below is a summary of your health goals and summary of our recent visit. You can also view your updated Chronic Care Management Care plan through your MyChart account.   Patient Goals/Self-Care Activities Take medications as prescribed,  Check blood pressure daily , document, and provide at future appointments Take rosuvastatin 20mg  daily - I have sent in prescription for 20mg  strength - you will take 1 tablet daily.  Send copy of insurance estimation of benefits to Kevin Bass or request report from Kevin Bass for your 2023 medication expenses.  collaborate with provider on medication access solutions   As always if you have any questions or concerns especially regarding medications, please feel free to contact me either at the phone number below or with a MyChart message.   Keep up the good work!  Kevin Bass, PharmD Clinical Pharmacist [place] [phone] (direct line)  [phone] (main office number)   Patient verbalizes understanding of instructions and care plan provided today and agrees to view in [place]. Active MyChart status and patient understanding of how to access instructions and care plan via MyChart confirmed with patient.

## 2021-10-21 NOTE — Chronic Care Management (AMB) (Signed)
Chronic Care Management Pharmacy Note  10/21/2021 Name:  Kevin Bass MRN:  [id number] DOB:  January 14, 1941  Summary:  Hypertension: patient reports fewer variation in blood pressure since starting losartan 30m 2.5 tablets daily and metoprolol Er 223m2.5 tablets daily. Pharmacy has dose of 3 tablet daily for losartan and metoprolol. Home blood pressure ranges 120 to 142 / 64 to 75.  Medication management: contacted [employer] medication assistance program to check to see if patient has been approved to received Eliquis. Representative reports patient is about $11 away from 3% out of pocket spend requirement. Patient notified. He is due to refill medications in the next week. Recommended he request updated out of pocket spend reports for all of 2023 so far and bring to either our office or cardio office to fax.    Subjective: Kevin Bass an 89.o. year old male who is a primary patient of Kevin Bass.  The CCM team was consulted for assistance with disease management and care coordination needs.    Engaged with patient by telephone and coordinated with medication assistance program for follow up visit and assistance with Eliquis medication assistance program application in response to provider referral for pharmacy case management and/or care coordination services.   Consent to Services:  The patient was given information about Chronic Care Management services, agreed to services, and gave verbal consent prior to initiation of services.  Please see initial visit note for detailed documentation.   Patient Care Team: PaColon BranchMD as PCP - General TaLovena LerChamp MungoMD as PCP - Cardiology (Cardiology) BoRaynelle BringMD as Consulting Physician (Urology) Kevin Bass as Referring Physician (Cardiology) Kevin Bass as Consulting Physician (Ophthalmology) Kevin Bass as Consulting Physician (Neurology) Kevin Bass)  Recent office  visits: 09/25/2021 - Int Med - Phone Call. Patient reports since med changes at appt 08/02/202 BP has been 155/85 He restarted losartan 7568maily and metoprolol 21m66mily and BP on date of call was 142/81 and 110/46 with HR  71, 61. He restarted  09/23/2021 - Int Med (Dr [name])Kevin Bass for low BP at home. Patient had stopped amlodipine and decreased metoprolol form 21mg52mly to 50mg 43mt 1 weeks ago. Recommended decrease losartan to 25mg -54mablets = 50mg da3mand continue lower dose of metoprool 25mg 2 t35m= 50mg dail12m/04/2021 - Int Med (Dr [name]) Seen Kevin Bass follow up chronic conditions. Labs checked. No med changes noted.   Recent consult visits: 09/14/2021 - Otolaryngology (Dr Rosen) seeConstance Bass laryngopharyngeal reflux. Recommended reduce caffeine; avoid tobacco, chocolate, alcohol and peppermint. No med changes noted.  08/17/2021 - Neuro (Dr Aquino) SeDelice Bass trigeminal neuralgia. No med changes - continue gabapentin and baclofen.  05/14/2021 - Ophthalmology (Dr [name]/ [facility]) [facility] up drusen of left macula, vitremacular adhesion of right eye, trigeminal neuralgia of right side of face, posterior vitreous detachment of left eye, cataract of both eyes and right homonymous hemianopsia due to cerebral infarctions. Stopped ketorolac OD due to not effective. Continue to use lubricating eye drops 4 times a day. Not recommended to have cataract removal yet.    Bass visits: None in previous 6 months  Objective:  Lab Results  Component Value Date   CREATININE 1.02 08/24/2021   CREATININE 1.04 11/21/2020   CREATININE 1.08 06/11/2020    Lab Results  Component Value Date   HGBA1C 5.4 12/12/2020   Last diabetic Eye exam: No  results found for: "HMDIABEYEEXA"  Last diabetic Foot exam: No results found for: "HMDIABFOOTEX"      Component Value Date/Time   CHOL 111 08/24/2021 1352   TRIG 111 08/24/2021 1352   HDL 41 08/24/2021 1352   CHOLHDL 2.7 08/24/2021 1352   VLDL  25.4 12/11/2018 1101   LDLCALC 51 08/24/2021 1352   LDLDIRECT 149.1 08/30/2012 1352       Latest Ref Rng & Units 12/12/2020    1:40 PM 08/30/2019    3:08 PM 08/09/2018   10:38 AM  Hepatic Function  Total Protein 6.5 - 8.1 g/dL  7.2  6.2   Albumin 3.5 - 5.0 g/dL  4.1  4.0   AST 10 - 35 U/L '19  21  17   ' ALT 9 - 46 U/L '16  17  17   ' Alk Phosphatase 38 - 126 U/L  47  51   Total Bilirubin 0.3 - 1.2 mg/dL  1.0  0.5     Lab Results  Component Value Date/Time   TSH 2.12 09/23/2021 01:28 PM   TSH 2.19 06/11/2020 01:51 PM       Latest Ref Rng & Units 09/23/2021    1:28 PM 12/12/2020    1:40 PM 06/11/2020    1:51 PM  CBC  WBC 4.0 - 10.5 K/uL 5.8  6.6  6.1   Hemoglobin 13.0 - 17.0 g/dL 14.1  14.4  13.7   Hematocrit 39.0 - 52.0 % 41.9  43.9  40.9   Platelets 150.0 - 400.0 K/uL 181.0  214  180.0     Lab Results  Component Value Date/Time   VD25OH 39 10/16/2008 08:55 PM    Clinical ASCVD: Yes  The ASCVD Risk score (Arnett DK, et al., 2019) failed to calculate for the following reasons:   The 2019 ASCVD risk score is only valid for ages 68 to 31   The patient has a prior MI or stroke diagnosis     Social History   Tobacco Use  Smoking Status Former   Packs/day: 0.50   Years: 20.00   Total pack years: 10.00   Types: Cigarettes   Quit date: 02/23/1983   Years since quitting: 38.6  Smokeless Tobacco Never   BP Readings from Last 3 Encounters:  09/23/21 126/68  08/24/21 128/78  08/17/21 122/71   Pulse Readings from Last 3 Encounters:  09/23/21 (!) 58  08/24/21 62  08/17/21 (!) 56   Wt Readings from Last 3 Encounters:  09/23/21 175 lb 2 oz (79.4 kg)  08/24/21 176 lb 6 oz (80 kg)  08/17/21 174 lb 12.8 oz (79.3 kg)    Assessment: Review of patient past medical history, allergies, medications, health status, including review of consultants reports, laboratory and other test data, was performed as part of comprehensive evaluation and provision of chronic care management  services.   SDOH:  (Social Determinants of Health) assessments and interventions performed:        CCM Care Plan  Allergies  Allergen Reactions   Prednisolone Acetate     Increased intraocular pressure   Statins Other (See Comments)    Myalgias (intolerance) Simvastatin, atorvastatin, pravastatin    Medications Reviewed Today     Reviewed by [name], RPH-CPP (Pharmacist) on 10/21/21 at 28  Med List Status: <None>   Medication Order Taking? Sig Documenting Provider Last Dose Status Informant  acetaminophen (TYLENOL) 650 MG CR tablet [id number] No Take 1,300 mg by mouth every 8 (eight) hours as needed for pain. [provider] Taking Active Multiple Informants  ALPRAZolam (XANAX) 0.25 MG tablet [id number] No TAKE 1 TABLET(0.25 MG) BY MOUTH TWICE DAILY AS NEEDED FOR ANXIETY Colon Branch, MD Taking Active   Arginine 500 MG CAPS [id number] No Take 1,000 mg by mouth daily.  [provider] Taking Active Multiple Informants  Baclofen 5 MG TABS [id number] No TAKE 1 TABLET BY MOUTH THREE TIMES DAILY AS NEEDED FOR PAIN Cameron Sprang, MD Taking Active   Coenzyme Q10 100 MG TABS [id number] No Take 1 tablet by mouth in the morning and at bedtime. [provider] Taking Active   ELIQUIS 5 MG TABS tablet [id number] No TAKE 1 TABLET BY MOUTH TWICE DAILY Hilty, Nadean Corwin, MD Taking Active   gabapentin (NEURONTIN) 300 MG capsule [id number] No TAKE 1 CAPSULE BY MOUTH FOUR TIMES DAILY AS DIRECTED. TAKE 1 EVERY MORNING, 1 AT 8:30PM, 9:30 PM, AND 10:00PM Cameron Sprang, MD Taking Active   Glucosamine-MSM-Hyaluronic Acd (JOINT HEALTH PO) [id number] No Take 1 capsule by mouth daily.  [provider] Taking Active Multiple Informants  loratadine (CLARITIN) 10 MG tablet [id number] No Take 10 mg by mouth at bedtime. [provider] Taking Active Multiple Informants           Med Note Pilar Plate Sep 12, 2016 11:40 AM)    losartan (COZAAR) 25 MG  tablet [id number] No Take 2 tablets (50 mg total) by mouth daily.  Patient taking differently: Take 62.2 mg by mouth daily.   Colon Branch, MD Taking Active   metoprolol succinate (TOPROL-XL) 25 MG 24 hr tablet [id number] No Take 2 tablets (50 mg total) by mouth daily.  Patient taking differently: Take 62.2 mg by mouth daily.   Colon Branch, MD Taking Active   Multiple Vitamins-Minerals (OCUVITE PRESERVISION PO) [id number] No Take 1 capsule by mouth 2 (two) times daily.  [provider] Taking Active Multiple Informants  nitroGLYCERIN (NITROSTAT) 0.4 MG SL tablet [id number] No Place 1 tablet (0.4 mg total) under the tongue every 5 (five) minutes as needed for chest pain.  Patient not taking: Reported on 08/24/2021   Colon Branch, MD Not Taking Active            Med Note [name], [place] Aug 24, 2021  1:11 PM) PRN  Baruch Gouty [id number] No Take 1-2 packets by mouth See admin instructions. Lypo-Spheric vitamin C gel packets- Mix 1-2 packets of gel into juice one to two times a day and drink [provider] Taking Active Multiple Informants  NON FORMULARY [id number] No Take 10 mg by mouth See admin instructions. [employer] Lyco-Sorb (for Prostate & Cardiovascular Health) 10 mg capsules: Take 10 mg by mouth two times a day [provider] Taking Active Multiple Informants  Omega-3 Fatty Acids (FISH OIL PO) [id number] No Take 1 capsule by mouth in the morning and at bedtime. [provider] Taking Active Multiple Informants  omeprazole (PRILOSEC) 40 MG capsule [id number] No TAKE 1 CAPSULE(40 MG) BY MOUTH DAILY Colon Branch, MD Taking Active   rosuvastatin (CRESTOR) 20 MG tablet [id number]  Take 1 tablet (20 mg total) by mouth daily. Colon Branch, MD  Active   Vitamin D, Cholecalciferol, 25 MCG (1000 UT) TABS [id number] No Take 1 tablet by mouth daily. [provider] Taking Active             Patient Active Problem List   Diagnosis Date Noted  Laryngopharyngeal reflux (LPR) 09/14/2021   Chronic rhinitis 03/29/2019   TIA (transient ischemic attack) 12/03/2017   Chronic anticoagulation 12/03/2017   H/O: stroke 12/03/2017   Paroxysmal atrial fibrillation ([place]) 07/11/2017   SCC (squamous cell carcinoma) 06/22/2017   Varicose veins of both lower extremities 06/19/2017   Bilateral lower extremity edema 06/19/2017   Trichiasis without entropion of right lower eyelid 04/14/2017   Paresthesias 03/03/2017   Trigeminal neuralgia of right side of face 10/01/2016   History of mitral valve repair 09/30/2016   Cerebrovascular accident (CVA) ([place]) 09/12/2016   GERD (gastroesophageal reflux disease) 09/22/2015   PCP NOTES >>>>> 11/08/2014   Chest pain 06/07/2014   Facial neuropathy 04/02/2013   Anxiety and depression 02/26/2013   Annual physical exam 08/30/2012   HTN (hypertension) 01/22/2011   BPH (benign prostatic hyperplasia) 10/16/2008   Hyperglycemia 10/16/2008   Hyperlipidemia 06/15/2006   Mitral valve disease 06/15/2006   Allergic rhinitis 06/15/2006   COLON CANCER, HX OF 06/15/2006    Immunization History  Administered Date(s) Administered   Fluad Quad(high Dose 65+) 11/13/2018, 12/12/2020   Influenza Whole 03/10/2007, 11/17/2007   Influenza-Unspecified 01/17/2017   PFIZER(Purple Top)SARS-COV-2 Vaccination 04/17/2019, 05/08/2019, 01/02/2020   Pneumococcal Conjugate-13 01/16/2014   Pneumococcal Polysaccharide-23 01/12/2005, 04/21/2010, 06/11/2020   Td 06/15/2006, 01/05/2015   Tdap 12/13/2016   Zoster Recombinat (Shingrix) 02/03/2017, 12/14/2018   Zoster, Live 03/25/1998   Conditions to be addressed/monitored: Atrial Fibrillation, CAD, HTN, HLD, Anxiety and trigeminal neuralgia with ocular effects; BPH; pre DM; h/o recurrent stroke   Care Plan : General Pharmacy (Adult)  Updates made by [name], RPH-CPP since 10/21/2021 12:00 AM     Problem: Chronic Disease Management support, education, and care coordination  needs related to  AFib, Pre-DM, HTN, HLD, Hx of Stroke, Anxiety/Depression, GERD, Allergic Rhinitis, Trigeminal Neuralgia   Priority: Medium  Onset Date: 06/19/2020  Note:   Current Barriers:  When in Medicare coverage gap, needs assistance with cost of Eliquis Not taking medication according to prescribed directions  Pharmacist Clinical Goal(s):  Over the next 180 days, patient will maintain control of HTN, hyperlipidemia as evidenced by attainment of goals listed below  adhere to plan to optimize therapeutic regimen for hyperlipiemia and hypertension  as evidenced by report of adherence to recommended medication management changes contact provider office for questions/concerns as evidenced notation of same in electronic health record through collaboration with PharmD and provider.   Interventions: 1:1 collaboration with Colon Branch, MD regarding development and update of comprehensive plan of care as evidenced by provider attestation and co-signature Inter-disciplinary care team collaboration (see longitudinal plan of care) Comprehensive medication review performed; medication list updated in electronic medical record   Hypertension BP Readings from Last 3 Encounters:  09/23/21 126/68  08/24/21 128/78  08/17/21 122/71  Currently at goal of BP <130/80 Current regimen:  Losartan 74m - take 2 tablets = 574mdaily - but patient is taking 2.5 tablets = 62.66m29maily  Metoprolol succinate ER 107m57mtake 2 tablets = 50mg68mh morning - but patient is taking 2.5 tablets = 62.66mg d58my  Amlodipine stopped in September 2022 and again in 2023 due to dizziness and low blood pressure;  Home blood pressure reading ranges from 100 to 135 / 65 to 75 Patient denies dizziness or recent chest pain Interventions: Discussed blood pressure goal Recommended to check blood pressure daily, document, and provide at future appointments Ensure daily salt intake < 2300 mg/day Continue current therapy for  hypertension (will hold off on  updating prescriptions for losartan and metoprolol as patient's dose has changed twice in the last 1 to 2 months.  Hyperlipidemia  Goal LDL < 70 (history of stroke) Currently LDL at goal Current regimen:  Rosuvastatin 12m daily (Rx updated at last visit 10/06/2021) Omega 3 fatty acid - twice daily Coenzyme Q10 - 100 mg twice a day Interventions: Discussed LDL goal  Recommended continue Omega 3 fatty acids Changed rosuvastatin to 270mdaily since LDL at goal with this dose. Updated prescription sent to pharmacy for 2061mablets - daily sent at last visit. Will continue to monitor adherence. Unfortunately will likely not pass statin adherence measure for 2023 but with correction in rosuvastatin Rx will in 2024.   Atrial Fibrillation:  Current regimen:  Metoprolol succinate ER 56m39mtake 2 tablets = 50mg71mly - but patient is taking 2.5 tablets = 62.2mg d61my  Eliquis 5mg - 49me 1 tablet twice a day Patient denies and signs or symptoms of bleeding Benefits for Medicare has restarted for 2023. Patient has reached Coverage gap  Interventions: Reviewed Eliquis dosing - no need for dose adjustment Continue to current regimen for atrial fibrillation and stroke prevention Contacted Eliquis medication assistance program. They have received pharmacy report but patient is about $11 short of spending 3% of income out of pocket for 2023. Patient reports he will need refills soon. He will request another pharmacy out of pocket expense report and bring to either our office or cardio office to send to medication assistance program.    Medication management Current pharmacy: [facility] Interventions Comprehensive medication review performed. Continue current medication management strategy Reviewed adherence through fill history - patient has filled medications on time even though he reports that he has not been taking rosuvastatin as prescribed. Recommended restart  rosuvastatin 40mg da57m Patient is reminded and encouraged to report any questions or concerns to PharmD and/or provider  Patient Goals/Self-Care Activities Over the next 180 days, patient will:  Take medications as prescribed,  Check blood pressure daily , document, and provide at future appointments Take rosuvastatin 20mg dai21m I have sent in prescription for 20mg stre10m - you will take 1 tablet daily.  Send copy of insurance estimation of benefits to Eliquis medication assistance program or [facility].  collaborate with provider on medication access solutions  Follow Up Plan: Telephone follow up appointment with care management team member scheduled for: 1 to 2 months.         Medication Assistance:  patient completed patient assistance program applicaton for Eliquis in August 2023 but has not met 3% OOP spend yet. Has about $11 left to meet 3% income out of pocket spend.      [facility]  [id number] [phone]- [address] [place] ([place]) [place] [address] [phone] Up:  Patient agrees to Care Plan and Follow-up.  Plan: Telephone follow up appointment with care management team member scheduled for:  2 to 3 weeks to check patient assistance program application.     Madden Garron EckaCherre Robinslinical Pharmacist [place] [facility]

## 2021-10-22 ENCOUNTER — Telehealth: Payer: Self-pay | Admitting: Internal Medicine

## 2021-10-22 DIAGNOSIS — I10 Essential (primary) hypertension: Secondary | ICD-10-CM | POA: Diagnosis not present

## 2021-10-22 DIAGNOSIS — E785 Hyperlipidemia, unspecified: Secondary | ICD-10-CM

## 2021-10-22 DIAGNOSIS — I4891 Unspecified atrial fibrillation: Secondary | ICD-10-CM | POA: Diagnosis not present

## 2021-10-22 NOTE — Telephone Encounter (Signed)
Pt stated he dropped off some paperwork yesterday regarding his eliquis and wanted to go over it with Tammy. Please advise.

## 2021-10-22 NOTE — Telephone Encounter (Signed)
Received patient's updated pharmacy report that shows he has additional pharmacy cost 10/15/2021. This should qualify him for Eliquis medication assistance program. Faxed report to [facility] patient assistance for review.

## 2021-10-27 ENCOUNTER — Other Ambulatory Visit: Payer: Self-pay

## 2021-10-27 DIAGNOSIS — I480 Paroxysmal atrial fibrillation: Secondary | ICD-10-CM

## 2021-10-27 MED ORDER — APIXABAN 5 MG PO TABS: 5.0000 mg | ORAL_TABLET | Freq: Two times a day (BID) | ORAL | 1 refills | Status: DC

## 2021-10-27 NOTE — Telephone Encounter (Signed)
Prescription refill request for Eliquis received. Indication: Afib  Last office visit: 02/16/21 ([place])  Scr: 1.02 (08/24/21)  Age: 81 Weight: 79.4kg  Appropriate dose and refill sent to requested pharmacy.

## 2021-10-28 NOTE — Telephone Encounter (Signed)
Patient approved for assistance from 10/23/21 -- 02/21/22

## 2021-11-02 ENCOUNTER — Ambulatory Visit: Payer: PPO | Admitting: Pharmacist

## 2021-11-02 DIAGNOSIS — I4891 Unspecified atrial fibrillation: Secondary | ICD-10-CM

## 2021-11-02 DIAGNOSIS — I10 Essential (primary) hypertension: Secondary | ICD-10-CM

## 2021-11-02 NOTE — Chronic Care Management (AMB) (Signed)
Chronic Care Management Pharmacy Note  11/02/2021 Name:  Kevin Bass MRN:  [id number] DOB:  1940/08/01  Summary:  Atrial fibrillation: Coordinated with [facility] medication assistance program. Patient approved for Eliquis medication assistance program 10/23/2021. Patient called and he endorsed he received first shipment 10/31/2021.  Hypertension: patient reports fewer variation in blood pressure since starting losartan 21m 2.5 tablets daily and metoprolol ER 231m2.5 tablets daily. Pharmacy has dose of 3 tablet daily for losartan and metoprolol. Home blood pressure ranges 125 to 145 / 70's  Subjective: Kevin Bass an [age].o. year old male who is a primary patient of Kevin Bass.  The CCM team was consulted for assistance with disease management and care coordination needs.    Engaged with patient by telephone and coordinated with medication assistance program for follow up visit and assistance with Eliquis medication assistance program application in response to provider referral for pharmacy case management and/or care coordination services.   Consent to Services:  The patient was given information about Chronic Care Management services, agreed to services, and gave verbal consent prior to initiation of services.  Please see initial visit note for detailed documentation.   Patient Care Team: PaColon BranchMD as PCP - General TaLovena LerChamp MungoMD as PCP - Cardiology (Cardiology) BoRaynelle BringMD as Consulting Physician (Urology) [name] as Referring Physician (Cardiology) [name] as Consulting Physician (Ophthalmology) [name] as Consulting Physician (Neurology) EcCherre RobinsRPWest PensacolaPharmacist)  Recent office visits: 09/25/2021 - Int Med - Phone Call. Patient reports since med changes at appt 08/02/202 BP has been 155/85 He restarted losartan 7557maily and metoprolol 33m38mily and BP on date of call was 142/81 and 110/46  with HR  71, 61. He restarted  09/23/2021 - Int Med (Dr [name])Kevin Bass for low BP at home. Patient had stopped amlodipine and decreased metoprolol form 33mg77mly to 50mg 32mt 1 weeks ago. Recommended decrease losartan to 25mg -92mablets = 50mg da38mand continue lower dose of metoprool 25mg 2 t94m= 50mg dail39m/04/2021 - Int Med (Dr [name]) Seen Kevin Kells follow up chronic conditions. Labs checked. No med changes noted.   Recent consult visits: 09/14/2021 - Otolaryngology (Dr Rosen) seeConstance Bass laryngopharyngeal reflux. Recommended reduce caffeine; avoid tobacco, chocolate, alcohol and peppermint. No med changes noted.  08/17/2021 - Neuro (Dr Aquino) SeDelice Bass trigeminal neuralgia. No med changes - continue gabapentin and baclofen.  05/14/2021 - Ophthalmology (Dr TadionchenGaylord Bass/ [facility]) [facility] drusen of left macula, vitremacular adhesion of right eye, trigeminal neuralgia of right side of face, posterior vitreous detachment of left eye, cataract of both eyes and right homonymous hemianopsia due to cerebral infarctions. Stopped ketorolac OD due to not effective. Continue to use lubricating eye drops 4 times a day. Not recommended to have cataract removal yet.    Hospital visits: None in previous 6 months  Objective:  Lab Results  Component Value Date   CREATININE 1.02 08/24/2021   CREATININE 1.04 11/21/2020   CREATININE 1.08 06/11/2020    Lab Results  Component Value Date   HGBA1C 5.4 12/12/2020   Last diabetic Eye exam: No results found for: "HMDIABEYEEXA"  Last diabetic Foot exam: No results found for: "HMDIABFOOTEX"      Component Value Date/Time   CHOL 111 08/24/2021 1352   TRIG 111 08/24/2021 1352   HDL 41 08/24/2021 1352   CHOLHDL 2.7 08/24/2021 1352   VLDL  25.4 12/11/2018 1101   LDLCALC 51 08/24/2021 1352   LDLDIRECT 149.1 08/30/2012 1352       Latest Ref Rng & Units 12/12/2020    1:40 PM 08/30/2019    3:08 PM 08/09/2018   10:38 AM  Hepatic Function  Total  Protein 6.5 - 8.1 g/dL  7.2  6.2   Albumin 3.5 - 5.0 g/dL  4.1  4.0   AST 10 - 35 U/L '19  21  17   ' ALT 9 - 46 U/L '16  17  17   ' Alk Phosphatase 38 - 126 U/L  47  51   Total Bilirubin 0.3 - 1.2 mg/dL  1.0  0.5     Lab Results  Component Value Date/Time   TSH 2.12 09/23/2021 01:28 PM   TSH 2.19 06/11/2020 01:51 PM       Latest Ref Rng & Units 09/23/2021    1:28 PM 12/12/2020    1:40 PM 06/11/2020    1:51 PM  CBC  WBC 4.0 - 10.5 K/uL 5.8  6.6  6.1   Hemoglobin 13.0 - 17.0 g/dL 14.1  14.4  13.7   Hematocrit 39.0 - 52.0 % 41.9  43.9  40.9   Platelets 150.0 - 400.0 K/uL 181.0  214  180.0     Lab Results  Component Value Date/Time   VD25OH 39 10/16/2008 08:55 PM    Clinical ASCVD: Yes  The ASCVD Risk score (Arnett DK, et al., 2019) failed to calculate for the following reasons:   The 2019 ASCVD risk score is only valid for ages 55 to 31   The patient has a prior MI or stroke diagnosis     Social History   Tobacco Use  Smoking Status Former   Packs/day: 0.50   Years: 20.00   Total pack years: 10.00   Types: Cigarettes   Quit date: 02/23/1983   Years since quitting: 38.7  Smokeless Tobacco Never   BP Readings from Last 3 Encounters:  09/23/21 126/68  08/24/21 128/78  08/17/21 122/71   Pulse Readings from Last 3 Encounters:  09/23/21 (!) 58  08/24/21 62  08/17/21 (!) 56   Wt Readings from Last 3 Encounters:  09/23/21 175 lb 2 oz (79.4 kg)  08/24/21 176 lb 6 oz (80 kg)  08/17/21 174 lb 12.8 oz (79.3 kg)    Assessment: Review of patient past medical history, allergies, medications, health status, including review of consultants reports, laboratory and other test data, was performed as part of comprehensive evaluation and provision of chronic care management services.   SDOH:  (Social Determinants of Health) assessments and interventions performed:  SDOH Interventions    Flowsheet Row Chronic Care Management from 10/06/2021 in [facility] at [place] Management from 07/06/2021 in [facility] at [place] Management from 04/07/2021 in [facility] [place] Management from 01/06/2021 in [place] [facility] [place] Visit from 12/12/2020 in [place] [facility] [place] Management from 09/12/2020 in [place] at [place] Interventions        Depression Interventions/Treatment  -- -- -- -- Currently on Treatment, Counseling --  Financial Strain Interventions Other (Comment)  [patient has completed MAP for ELiquis and faxed. Reminded him to send report of 2023 OOP expence for medications.] Other (Comment)  [following for 8144 - mailed application but  has to reach coverage gap and spend 3% of income OOP before can enroll.] Other (Comment)  [will follow in 2023 and assist with PAP if needed when reaches Medicare coverage gap] Other (Comment)  [patient initially denied assistance for Eliquis, Needed to provide evidience he has spent 3% of income OOP on medications for 2022. Assisting patient with requesting updated med cost for 2022 report from pharmacy.] -- Other (Comment)  [application for Eliquis patient assistance provided at previous appt but has not been completed yet.]  Physical Activity Interventions Other (Comments)  [Plans to incresae frequency of walking when weather is cooler.] -- -- -- -- --           CCM Care Plan  Allergies  Allergen Reactions   Prednisolone Acetate     Increased intraocular pressure   Statins Other (See Comments)    Myalgias (intolerance) Simvastatin, atorvastatin, pravastatin    Medications Reviewed Today     Reviewed by [name], RPH-CPP (Pharmacist) on 10/21/21 at 63  Med List Status: <None>   Medication Order Taking? Sig Documenting Provider Last Dose Status Informant   acetaminophen (TYLENOL) 650 MG CR tablet [id number] No Take 1,300 mg by mouth every 8 (eight) hours as needed for pain. [provider] Taking Active Multiple Informants  ALPRAZolam (XANAX) 0.25 MG tablet [id number] No TAKE 1 TABLET(0.25 MG) BY MOUTH TWICE DAILY AS NEEDED FOR ANXIETY Colon Branch, MD Taking Active   Arginine 500 MG CAPS [id number] No Take 1,000 mg by mouth daily.  [provider] Taking Active Multiple Informants  Baclofen 5 MG TABS [id number] No TAKE 1 TABLET BY MOUTH THREE TIMES DAILY AS NEEDED FOR PAIN Cameron Sprang, MD Taking Active   Coenzyme Q10 100 MG TABS [id number] No Take 1 tablet by mouth in the morning and at bedtime. [provider] Taking Active   ELIQUIS 5 MG TABS tablet [id number] No TAKE 1 TABLET BY MOUTH TWICE DAILY Hilty, Nadean Corwin, MD Taking Active   gabapentin (NEURONTIN) 300 MG capsule [id number] No TAKE 1 CAPSULE BY MOUTH FOUR TIMES DAILY AS DIRECTED. TAKE 1 EVERY MORNING, 1 AT 8:30PM, 9:30 PM, AND 10:00PM Cameron Sprang, MD Taking Active   Glucosamine-MSM-Hyaluronic Acd (JOINT HEALTH PO) [id number] No Take 1 capsule by mouth daily.  [provider] Taking Active Multiple Informants  loratadine (CLARITIN) 10 MG tablet [id number] No Take 10 mg by mouth at bedtime. [provider] Taking Active Multiple Informants           Med Note Pilar Plate Sep 12, 2016 11:40 AM)    losartan (COZAAR) 25 MG tablet [id number] No Take 2 tablets (50 mg total) by mouth daily.  Patient taking differently: Take 62.2 mg by mouth daily.   Colon Branch, MD Taking Active   metoprolol succinate (TOPROL-XL) 25 MG 24 hr tablet [id number] No Take 2 tablets (50 mg total) by mouth daily.  Patient taking differently: Take 62.2 mg by mouth daily.   Colon Branch, MD Taking Active   Multiple Vitamins-Minerals (OCUVITE PRESERVISION PO) [id number] No Take 1 capsule by mouth 2 (two) times daily.  [provider] Taking Active Multiple  Informants  nitroGLYCERIN (NITROSTAT) 0.4 MG SL tablet [id number] No Place 1 tablet (0.4 mg total) under the tongue every 5 (five) minutes as needed for chest pain.  Patient not taking: Reported on 08/24/2021   Colon Branch, MD Not Taking Active  Med Note (CANTER, [place] Aug 24, 2021  1:11 PM) PRN  Baruch Gouty [id number] No Take 1-2 packets by mouth See admin instructions. Lypo-Spheric vitamin C gel packets- Mix 1-2 packets of gel into juice one to two times a day and drink [provider] Taking Active Multiple Informants  NON FORMULARY [id number] No Take 10 mg by mouth See admin instructions. [employer] Lyco-Sorb (for Prostate & Cardiovascular Health) 10 mg capsules: Take 10 mg by mouth two times a day [provider] Taking Active Multiple Informants  Omega-3 Fatty Acids (FISH OIL PO) [id number] No Take 1 capsule by mouth in the morning and at bedtime. [provider] Taking Active Multiple Informants  omeprazole (PRILOSEC) 40 MG capsule [id number] No TAKE 1 CAPSULE(40 MG) BY MOUTH DAILY Colon Branch, MD Taking Active   rosuvastatin (CRESTOR) 20 MG tablet [id number]  Take 1 tablet (20 mg total) by mouth daily. Colon Branch, MD  Active   Vitamin D, Cholecalciferol, 25 MCG (1000 UT) TABS [id number] No Take 1 tablet by mouth daily. [provider] Taking Active             Patient Active Problem List   Diagnosis Date Noted   Laryngopharyngeal reflux (LPR) 09/14/2021   Chronic rhinitis 03/29/2019   TIA (transient ischemic attack) 12/03/2017   Chronic anticoagulation 12/03/2017   H/O: stroke 12/03/2017   Paroxysmal atrial fibrillation ([place]) 07/11/2017   SCC (squamous cell carcinoma) 06/22/2017   Varicose veins of both lower extremities 06/19/2017   Bilateral lower extremity edema 06/19/2017   Trichiasis without entropion of right lower eyelid 04/14/2017   Paresthesias 03/03/2017   Trigeminal neuralgia of right side of face 10/01/2016    History of mitral valve repair 09/30/2016   Cerebrovascular accident (CVA) ([place]) 09/12/2016   GERD (gastroesophageal reflux disease) 09/22/2015   PCP NOTES >>>>> 11/08/2014   Chest pain 06/07/2014   Facial neuropathy 04/02/2013   Anxiety and depression 02/26/2013   Annual physical exam 08/30/2012   HTN (hypertension) 01/22/2011   BPH (benign prostatic hyperplasia) 10/16/2008   Hyperglycemia 10/16/2008   Hyperlipidemia 06/15/2006   Mitral valve disease 06/15/2006   Allergic rhinitis 06/15/2006   COLON CANCER, HX OF 06/15/2006    Immunization History  Administered Date(s) Administered   Fluad Quad(high Dose 65+) 11/13/2018, 12/12/2020   Influenza Whole 03/10/2007, 11/17/2007   Influenza-Unspecified 01/17/2017   PFIZER(Purple Top)SARS-COV-2 Vaccination 04/17/2019, 05/08/2019, 01/02/2020   Pneumococcal Conjugate-13 01/16/2014   Pneumococcal Polysaccharide-23 01/12/2005, 04/21/2010, 06/11/2020   Td 06/15/2006, 01/05/2015   Tdap 12/13/2016   Zoster Recombinat (Shingrix) 02/03/2017, 12/14/2018   Zoster, Live 03/25/1998   Conditions to be addressed/monitored: Atrial Fibrillation, CAD, HTN, HLD, Anxiety and trigeminal neuralgia with ocular effects; BPH; pre DM; h/o recurrent stroke   Care Plan : General Pharmacy (Adult)  Updates made by [name], RPH-CPP since 11/02/2021 12:00 AM     Problem: Chronic Disease Management support, education, and care coordination needs related to  AFib, Pre-DM, HTN, HLD, Hx of Stroke, Anxiety/Depression, GERD, Allergic Rhinitis, Trigeminal Neuralgia Resolved 11/02/2021  Priority: Medium  Onset Date: 06/19/2020  Note:   Current Barriers:  When in Medicare coverage gap, needs assistance with cost of Eliquis Not taking medication according to prescribed directions  Pharmacist Clinical Goal(s):  Over the next 180 days, patient will maintain control of HTN, hyperlipidemia as evidenced by attainment of goals listed below  adhere to plan to optimize  therapeutic regimen for hyperlipiemia and hypertension  as evidenced by report  of adherence to recommended medication management changes contact provider office for questions/concerns as evidenced notation of same in electronic health record through collaboration with PharmD and provider.   Interventions: 1:1 collaboration with Colon Branch, MD regarding development and update of comprehensive plan of care as evidenced by provider attestation and co-signature Inter-disciplinary care team collaboration (see longitudinal plan of care) Comprehensive medication review performed; medication list updated in electronic medical record   Hypertension BP Readings from Last 3 Encounters:  09/23/21 126/68  08/24/21 128/78  08/17/21 122/71  Currently at goal of BP <130/80 Current regimen:  Losartan 5m - take 2 tablets = 535mdaily - but patient is taking 2.5 tablets = 62.72m93maily  Metoprolol succinate ER 63m8mtake 2 tablets = 50mg672mh morning - but patient is taking 2.5 tablets = 62.72mg d34my  Amlodipine stopped in September 2022 and again in 2023 due to dizziness and low blood pressure;  Home blood pressure reading ranges from 100 to 135 / 65 to 75 Patient denies dizziness or recent chest pain Interventions: Discussed blood pressure goal Recommended to check blood pressure daily, document, and provide at future appointments Ensure daily salt intake < 2300 mg/day Continue current therapy for hypertension (will hold off on updating prescriptions for losartan and metoprolol as patient's dose has changed twice in the last 1 to 2 months.  Hyperlipidemia  Goal LDL < 70 (history of stroke) Currently LDL at goal Current regimen:  Rosuvastatin 40mg d38m (Rx updated at last visit 10/06/2021) Omega 3 fatty acid - twice daily Coenzyme Q10 - 100 mg twice a day Interventions: Discussed LDL goal  Recommended continue Omega 3 fatty acids Changed rosuvastatin to 20mg da71msince LDL at goal with this  dose. Updated prescription sent to pharmacy for 20mg tab472m - daily sent at last visit. Will continue to monitor adherence. Unfortunately will likely not pass statin adherence measure for 2023 but with correction in rosuvastatin Rx will in 2024.   Atrial Fibrillation:  Current regimen:  Metoprolol succinate ER 63mg - ta472m tablets = 50mg daily59mut patient is taking 2.5 tablets = 62.72mg daily  57mquis 5mg - take 195mblet twice a day Patient denies and signs or symptoms of bleeding Benefits for Medicare has restarted for 2023. Patient has reached Coverage gap  Interventions: Reviewed Eliquis dosing - no need for dose adjustment Continue to current regimen for atrial fibrillation and stroke prevention Contacted Eliquis medication assistance program. They have received pharmacy report but patient is about $11 short of spending 3% of income out of pocket for 2023. Patient reports he will need refills soon. He will request another pharmacy out of pocket expense report and bring to either our office or cardio office to send to medication assistance program.    Medication management Current pharmacy: [facility] Interventions Comprehensive medication review performed. Continue current medication management strategy Reviewed adherence through fill history - patient has filled medications on time even though he reports that he has not been taking rosuvastatin as prescribed. Recommended restart rosuvastatin 40mg daily  P60mnt is reminded and encouraged to report any questions or concerns to PharmD and/or provider  Patient Goals/Self-Care Activities Over the next 180 days, patient will:  Take medications as prescribed,  Check blood pressure daily , document, and provide at future appointments Take rosuvastatin 20mg daily - I44me sent in prescription for 20mg strength -109m will take 1 tablet daily.  Send copy of insurance estimation of benefits to Eliquis medication assistance program or  request  report from [facility] for your 2023 medication expenses.  collaborate with provider on medication access solutions  Follow Up Plan: Telephone follow up appointment with care management team member scheduled for: 1 to 2 months.          Medication Assistance:  patient completed patient assistance program applicaton for Eliquis in August 2023 but has not met 3% OOP spend yet. Has about $11 left to meet 3% income out of pocket spend.      Patient's preferred pharmacy is:  [id number] [place], [place] - [address] AT [address] [place] [address] Phone: [phone] Fax: [phone]  [facility] ([address]  - [address] [place]  [address] Phone: [phone] Fax: [phone]  [place], [address] [place] [address] [place] [address] Phone: [phone] Fax: [phone]    Follow Up:  Patient agrees to Care Plan and Follow-up.  Plan:  Patient will be transitioned to just care coordination for Clinical Pharmacist Practitioner to follow regarding medication adherence and Medication assistance.     [name], PharmD Clinical Pharmacist [facility]

## 2021-11-06 ENCOUNTER — Other Ambulatory Visit: Payer: Self-pay | Admitting: Internal Medicine

## 2021-11-11 ENCOUNTER — Telehealth: Payer: Self-pay | Admitting: Neurology

## 2021-11-11 NOTE — Telephone Encounter (Signed)
Pt called to get more information on symptoms no answer left a voice mail to call the office back

## 2021-11-11 NOTE — Telephone Encounter (Signed)
Pt called in stating he has been having some issues with his right leg and hand. He has been having some numbness and tingling and sometimes his foot will be completely numb.

## 2021-11-11 NOTE — Telephone Encounter (Signed)
No stroke symptoms. The numbness is the same as what Dr [name] has been treating just getting worse over the last couple of weeks,

## 2021-11-12 NOTE — Telephone Encounter (Signed)
We can order nerve testing to look into this further.  If agreeable, please order EMG of the right arm and leg.

## 2021-11-12 NOTE — Telephone Encounter (Signed)
Patient returned call and left a VM requesting call back

## 2021-11-12 NOTE — Telephone Encounter (Signed)
Pt called no answer left a voice mail to call the office back when he calls back send to clinical staff so we can let him know that Dr [name] is out of the office but Dr Posey Pronto stated We can order nerve testing to look into this further.  If agreeable, please order EMG of the right arm and leg.

## 2021-11-13 NOTE — Telephone Encounter (Signed)
Pt called informed that Dr [name] is out of the office but Dr Posey Pronto stated We can order nerve testing to look into this further, He asked to see Dr [name] 1st he was told that Dr [name] is booked out I can place him on a wait list he said " that is crazy she cant just squeeze me in" I tols him that is why I am placing you on a wait list so in something comes open we can call and work you in. He was also advised we may be able to place him on Clarise Cruz schedule to get him seen sooner he said he didn't know about that he would have to think on it,

## 2021-11-17 NOTE — Telephone Encounter (Signed)
Scheduled for 9/27

## 2021-11-18 ENCOUNTER — Ambulatory Visit: Payer: PPO | Admitting: Neurology

## 2021-11-18 ENCOUNTER — Encounter: Payer: Self-pay | Admitting: Neurology

## 2021-11-18 VITALS — BP 129/78 | HR 66 | Ht 70.0 in | Wt 175.4 lb

## 2021-11-18 DIAGNOSIS — G500 Trigeminal neuralgia: Secondary | ICD-10-CM

## 2021-11-18 DIAGNOSIS — G629 Polyneuropathy, unspecified: Secondary | ICD-10-CM | POA: Diagnosis not present

## 2021-11-18 MED ORDER — GABAPENTIN 300 MG PO CAPS: ORAL_CAPSULE | ORAL | 3 refills | Status: DC

## 2021-11-18 MED ORDER — OXCARBAZEPINE 150 MG PO TABS: ORAL_TABLET | ORAL | 5 refills | Status: DC

## 2021-11-18 MED ORDER — OXCARBAZEPINE 150 MG PO TABS
ORAL_TABLET | ORAL | 5 refills | Status: DC
Start: 2021-11-18 — End: 2021-11-25

## 2021-11-18 NOTE — Patient Instructions (Addendum)
Hope you feel better soon.  Start the oxcarbazepine 150mg : Take 1/2 tablet twice a day for 3 days, if no side effects or still with pain, increase to 1 tablet twice a day. We can further increase dose in the future if needed  2. You can also try increasing Gabapentin to 2 caps in AM, 3 caps in PM. Continue Baclofen 5mg  three times a day  3. We may consider repeating the nerve test in the future  4. Continue Eliquis, control of blood pressure, cholesterol, sugar levels  5. We can adjust medications over MyChart or by phone, follow-up in 2 months

## 2021-11-18 NOTE — Progress Notes (Signed)
NEUROLOGY FOLLOW UP OFFICE NOTE  Kevin Bass [id number] [birth year]  HISTORY OF PRESENT ILLNESS: I had the pleasure of seeing Kevin Bass in follow-up in the neurology clinic on 11/18/2021.  The patient was last seen 3 months ago for trigeminal neuralgia and recurrent strokes. He presents for an earlier visit after calling our office last week to report worsening numbness and tingling on the right hand and foot, sometimes the foot would be completely numb. On further questioning, his main concern today is a flare up of his trigeminal neuralgia for the past couple of weeks but lately worse, today it is 6 to 7 over 10. 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 He reports a sharp pain over the right brow, worse when he chews. He also has eye pain and uses Ketorolac eye drops. He is on Gabapentin 374m in AM, 9028min PM and Baclofen 68m32mID without side effects. The neuropathy in his right hand and foot have also gotten a bit worse but he can tolerate it. He has constant burning that waxes and wanes in intensity during the day. Symptoms do not bother him when he goes to bed. At night he has to put his foot up and blanket on top of it. He denies any weakness, dizziness, double vision, neck/back pain, no falls. He sleeps 7 hours at night.   His last EMG in 2019 showed mild to moderate right ulnar neuropathy at the elbow, mild right median neuropathy at the wrist. He had decompression surgery of both in 2019 but does not feel they helped. Normal EMG of right lower extremity.      Chemistry      Component Value Date/Time   NA 142 08/24/2021 1352   K 4.9 08/24/2021 1352   CL 105 08/24/2021 1352   CO2 29 08/24/2021 1352   BUN 19 08/24/2021 1352   CREATININE 1.02 08/24/2021 1352      Component Value Date/Time   CALCIUM 9.4 08/24/2021 1352   ALKPHOS 47 08/30/2019 1508   AST 19 12/12/2020 1340   ALT 16 12/12/2020 1340   BILITOT 1.0 08/30/2019 1508      Lab Results   Component Value Date   CHOL 111 08/24/2021   HDL 41 08/24/2021   LDLCALC 51 08/24/2021   LDLDIRECT 149.1 08/30/2012   TRIG 111 08/24/2021   CHOLHDL 2.7 08/24/2021   Lab Results  Component Value Date   HGBA1C 5.4 12/12/2020    History on Initial Assessment 10/01/2016: This is a pleasant [age] RH man with a history of colon cancer, hyperlipidemia, previous mitral valve repair in his usual state of health until 09/11/16 after taking a walk, he saw his brother-in-law and when he started noticing word-finding difficulties. His wife reported that his speech was not necessarily slurred but he was having trouble finding his words. When symptoms were unchanged the next day, he decided to go to the ER. He was still noted to have mild word-finding difficulties. There was no focal numbness/tingling/weakness. He denied any headaches, dizziness, diplopia, dysarthria/dysphagia. I personally reviewed MRI brain without contrast which showed an acute infarct in the left posterior insula and parietal operculum. MRA head and carotid dopplers did not show any significant stenosis. Echocardiogram showed EF 60-65%, mild LVH, prior MV repair without significant stenosis or regurgitation, normal LA size. He was switched from aspirin to Plavix. LDL was 124, HbA1c was 5.6. He was unable to get a TEE in the hospital, and has seen  Dr. Debara Pickett and agreed to having the procedure done on 8/14 followed by loop recorder. He denies any palpitations, chest pain, shortness of breath. No neck/back pain, bowel/bladder dysfunction. No falls. He denies any side effect on Plavix. He was started on Crestor yesterday. He feels he is 95% or more back to baseline, he denies any confusion or focal symptoms. He reports a history of facial pain on the right upper lip radiating up his cheek with good response to gabapentin. He takes 386m qhs with no side effects.   Update 12/07/16: He had no residual deficits from prior stroke, then on 11/29/16 he had  sudden onset vertigo that made him fall backward. This lasted for 15 seconds and was followed by a mild headache. He called triage and was instructed to go seek medical attention the next day. He went to the ER and was found to have a right PICA territory stroke. He was evaluated by Neurology with no focal deficits seen. He reports he was taking aspirin and Plavix when the stroke occurred. He had an extensive workup for the prior stroke, he had a TEE in August 2018 which showed normal left atrium, no thrombus seen. He had a 48-hour monitor which was unremarkable. In the hospital, he had a CTA of the head and neck which did not show any occlusion or stenosis. Repeat TTE showed EF of 55-60%, left atrium mildly dilated. He had a CT chest/abdomen/pelvis which did not show any evidence of malignancy. On hospital discharge, he was instructed to continue aspirin and Plavix and stop his antihypertensives. He had a loop recorder placed.   Update 12/06/2017: He underwent right median nerve and ulnar nerve decompression on 11/28/17. He had stopped the Eliquis for 2 days for the procedure, and restarted Eliquis on 11/29/17. On 12/02/17, he took a nap and woke up with right arm weakness and numbness. He could not lift his right arm up. After 20 minutes, he started getting more feeling in his arm. Per records, he stood up and noticed his gait was off. The weakness improved after several minutes, but he continued to feel off balance. He went to the ER where SBP was elevated at 170. NIH 0 in the hospital. Records were reviewed, he had an MRI brain without contrast showing an acute infarct in the left hippocampus, occlusion of the left PCA which could be acute. There were chronic infarcts in the right cerebellum and left parietal operculum. He had an echo which showed an EF of 55-60%, left atrium normal, right atrium moderately dilated. No significant stenosis on carotid dopplers. LDL in August 2019 was 120, he was advised to  increase Crestor to 431mdaily. He was having muscle pains and stopped statin for 5 months, he was restarted Crestor with Zetia last August.  He was discharged home 2 days ago and reports that this is the worst of his strokes. It has affected his memory more than anything. He has also noticed vision changes on the right visual field, he describes blurred/double vision reading on his right side. He feels his strength is back but his balance is still off. He also has left foot pain. He has been dealing with his wife and sister's recent deaths.    Diagnostic Data: Neuropathy labs were normal (TSH, B12, ESR, folate, SPEP/IFE). He had an EMG/NCV of the right UE and LE which showed right ulnar neuropathy with slowing across the elbow, axon loss and demyelinating in type, mild to moderate in degree; right median neuropathy  at or distal to the wrist, consistent with carpal tunnel, mild in degree, and probable L5 radiculopathy, mild. No evidence of a large fiber sensorimotor polyneuropathy.     PAST MEDICAL HISTORY: Past Medical History:  Diagnosis Date   Allergy    Anxiety    Arthritis    hands   Atrial fibrillation ([place])    Colon cancer ([place])    s/p partial colectomy 1990, Cscope neg 7-09, next 2014   DISORDER, MITRAL VALVE    MV recontruction at [place] 2006----still needs ABX for SBE prophylaxis    Elevated PSA    prostate nodule, [name]   GERD (gastroesophageal reflux disease)    Heart murmur    no problems since surgery   HYPERGLYCEMIA, BORDERLINE 10/16/2008   HYPERLIPIDEMIA 06/15/2006   diet controlled   Hypertension    HYPERTROPHY PROSTATE W/O UR OBST & OTH LUTS 10/16/2008   Neuropathy    Right groin pain    Dr. Redmond Pulling   SCC (squamous cell carcinoma) 06/2017   s/p Mohs---[name]   Stroke [facility])    Wears partial dentures    lower partial    MEDICATIONS: Current Outpatient Medications on File Prior to Visit  Medication Sig Dispense Refill   acetaminophen (TYLENOL) 650 MG CR tablet  Take 1,300 mg by mouth every 8 (eight) hours as needed for pain.     ALPRAZolam (XANAX) 0.25 MG tablet TAKE 1 TABLET(0.25 MG) BY MOUTH TWICE DAILY AS NEEDED FOR ANXIETY 60 tablet 0   apixaban (ELIQUIS) 5 MG TABS tablet Take 1 tablet (5 mg total) by mouth 2 (two) times daily. 180 tablet 1   Arginine 500 MG CAPS Take 1,000 mg by mouth daily.      Baclofen 5 MG TABS TAKE 1 TABLET BY MOUTH THREE TIMES DAILY AS NEEDED FOR PAIN 270 tablet 3   Coenzyme Q10 100 MG TABS Take 1 tablet by mouth in the morning and at bedtime.     gabapentin (NEURONTIN) 300 MG capsule TAKE 1 CAPSULE BY MOUTH FOUR TIMES DAILY AS DIRECTED. TAKE 1 EVERY MORNING, 1 AT 8:30PM, 9:30 PM, AND 10:00PM 360 capsule 3   Glucosamine-MSM-Hyaluronic Acd (JOINT HEALTH PO) Take 1 capsule by mouth daily.      loratadine (CLARITIN) 10 MG tablet Take 10 mg by mouth at bedtime.     losartan (COZAAR) 25 MG tablet Take 2 tablets (50 mg total) by mouth daily. (Patient taking differently: Take 75 mg by mouth daily. 3 tablets daily)     metoprolol succinate (TOPROL-XL) 25 MG 24 hr tablet Take 2 tablets (50 mg total) by mouth daily. (Patient taking differently: Take 75 mg by mouth daily. 3 tablets daily)     Multiple Vitamins-Minerals (OCUVITE PRESERVISION PO) Take 1 capsule by mouth 2 (two) times daily.      nitroGLYCERIN (NITROSTAT) 0.4 MG SL tablet Place 1 tablet (0.4 mg total) under the tongue every 5 (five) minutes as needed for chest pain. 50 tablet 1   NON FORMULARY Take 1-2 packets by mouth See admin instructions. Lypo-Spheric vitamin C gel packets- Mix 1-2 packets of gel into juice one to two times a day and drink     NON FORMULARY Take 10 mg by mouth See admin instructions. [employer] Lyco-Sorb (for Prostate & Cardiovascular Health) 10 mg capsules: Take 10 mg by mouth two times a day     Omega-3 Fatty Acids (FISH OIL PO) Take 1 capsule by mouth in the morning and at bedtime.  omeprazole (PRILOSEC) 40 MG capsule Take 1 capsule (40 mg  total) by mouth daily. (Patient taking differently: Take 20 mg by mouth daily.) 90 capsule 1   rosuvastatin (CRESTOR) 20 MG tablet Take 1 tablet (20 mg total) by mouth daily. 90 tablet 3   Vitamin D, Cholecalciferol, 25 MCG (1000 UT) TABS Take 1 tablet by mouth daily.     No current facility-administered medications on file prior to visit.    ALLERGIES: Allergies  Allergen Reactions   Prednisolone Acetate     Increased intraocular pressure   Statins Other (See Comments)    Myalgias (intolerance) Simvastatin, atorvastatin, pravastatin    FAMILY HISTORY: Family History  Problem Relation Age of Onset   Colon cancer Other        M side (cousins)   Diabetes Neg Hx    Stroke Neg Hx    CAD Neg Hx    Prostate cancer Neg Hx    Rectal cancer Neg Hx    Esophageal cancer Neg Hx     SOCIAL HISTORY: Social History   Socioeconomic History   Marital status: Widowed    Spouse name: suzanne   Number of children: 2   Years of education: college   Highest education level: Not on file  Occupational History   Occupation: retired   Tobacco Use   Smoking status: Former    Packs/day: 0.50    Years: 20.00    Total pack years: 10.00    Types: Cigarettes    Quit date: 02/23/1983    Years since quitting: 38.7   Smokeless tobacco: Never  Vaping Use   Vaping Use: Never used  Substance and Sexual Activity   Alcohol use: Yes    Alcohol/week: 7.0 standard drinks of alcohol    Types: 7 Glasses of wine per week   Drug use: No   Sexual activity: Not Currently  Other Topics Concern   Not on file  Social History Narrative   From [place], lost wife 08/2017 to cancer   Lives by himself    Daughter in [place], son in [place]   Right handed      Social Determinants of Health   Financial Resource Strain: Medium Risk (10/06/2021)   Overall Financial Resource Strain (CARDIA)    Difficulty of Paying Living Expenses: Somewhat hard  Food Insecurity: Not on file  Transportation Needs: Not on file   Physical Activity: Insufficiently Active (10/06/2021)   Exercise Vital Sign    Days of Exercise per Week: 2 days    Minutes of Exercise per Session: 30 min  Stress: Not on file  Social Connections: Not on file  Intimate Partner Violence: Not on file     PHYSICAL EXAM: Vitals:   11/18/21 0836  BP: 129/78  Pulse: 66  SpO2: 99%   General: No acute distress Head:  Normocephalic/atraumatic Skin/Extremities: No rash, no edema Neurological Exam: alert and awake. No aphasia or dysarthria. Fund of knowledge is appropriate.  Attention and concentration are normal.   Cranial nerves: Pupils equal, round. Extraocular movements intact with no nystagmus. Visual fields full.  Facial sensation intact. No facial asymmetry.  Motor: Bulk and tone normal, muscle strength 5/5 throughout with no pronator drift. Decreased cold on right UE and LE. Intact pin and vibration sense. Reflexes +2 on left UE and LE, +1 on right UE and LE. Gait narrow-based and steady, no ataxia.    IMPRESSION: This is a pleasant 81 yo RH man with a history of colon cancer, hyperlipidemia, previous  mitral valve repair with recurrent strokes (x3) secondary to atrial fibrillation, neuropathy, trigeminal neuralgia. He presents for an earlier visit today due to a flare up of right-sided trigeminal neuralgia. He also reports more burning sensation in the right arm and leg. We discussed adding oxcarbazepine 1107m 1/2 tab BID x 1 week, then increase to 1 tab BID for trigeminal neuralgia. He can also increase the Gabapentin 305100mto 2 caps in AM, 3 caps in PM. Side effects discussed. Continue Baclofen 100m29mID. We discussed repeating EMG/NCV of the right UE and LE, he would like to see how the medication changes affect him first. Continue Eliquis and control of vascular risk factors for secondary stroke prevention. Follow-up in 2 months, call for any changes.    Thank you for allowing me to participate in his care.  Please do not hesitate to call  for any questions or concerns.   KarEllouise Newer.D.   CC: Dr. PazLarose Kells

## 2021-11-22 ENCOUNTER — Encounter (HOSPITAL_BASED_OUTPATIENT_CLINIC_OR_DEPARTMENT_OTHER): Payer: Self-pay | Admitting: Emergency Medicine

## 2021-11-22 ENCOUNTER — Other Ambulatory Visit: Payer: Self-pay

## 2021-11-22 ENCOUNTER — Emergency Department (HOSPITAL_BASED_OUTPATIENT_CLINIC_OR_DEPARTMENT_OTHER)
Admission: EM | Admit: 2021-11-22 | Discharge: 2021-11-22 | Disposition: A | Discharge status: Home / Self Care | Payer: PPO | Attending: Student | Admitting: Student

## 2021-11-22 ENCOUNTER — Emergency Department (HOSPITAL_BASED_OUTPATIENT_CLINIC_OR_DEPARTMENT_OTHER): Payer: PPO

## 2021-11-22 DIAGNOSIS — Z7901 Long term (current) use of anticoagulants: Secondary | ICD-10-CM | POA: Diagnosis not present

## 2021-11-22 DIAGNOSIS — Z85038 Personal history of other malignant neoplasm of large intestine: Secondary | ICD-10-CM | POA: Insufficient documentation

## 2021-11-22 DIAGNOSIS — I159 Secondary hypertension, unspecified: Secondary | ICD-10-CM | POA: Diagnosis not present

## 2021-11-22 DIAGNOSIS — Z87891 Personal history of nicotine dependence: Secondary | ICD-10-CM | POA: Diagnosis not present

## 2021-11-22 DIAGNOSIS — Z79899 Other long term (current) drug therapy: Secondary | ICD-10-CM | POA: Diagnosis not present

## 2021-11-22 DIAGNOSIS — Z85828 Personal history of other malignant neoplasm of skin: Secondary | ICD-10-CM | POA: Diagnosis not present

## 2021-11-22 DIAGNOSIS — I10 Essential (primary) hypertension: Secondary | ICD-10-CM | POA: Insufficient documentation

## 2021-11-22 DIAGNOSIS — R519 Headache, unspecified: Secondary | ICD-10-CM | POA: Diagnosis present

## 2021-11-22 DIAGNOSIS — G500 Trigeminal neuralgia: Secondary | ICD-10-CM | POA: Diagnosis not present

## 2021-11-22 LAB — BASIC METABOLIC PANEL
Anion gap: 6 (ref 5–15)
BUN: 17 mg/dL (ref 8–23)
CO2: 28 mmol/L (ref 22–32)
Calcium: 9 mg/dL (ref 8.9–10.3)
Chloride: 98 mmol/L (ref 98–111)
Creatinine, Ser: 0.88 mg/dL (ref 0.61–1.24)
GFR, Estimated: >60 mL/min (ref >60)
Glucose, Bld: 127 mg/dL — ABNORMAL HIGH (ref 70–99)
Potassium: 3.9 mmol/L (ref 3.5–5.1)
Sodium: 132 mmol/L — ABNORMAL LOW (ref 135–145)

## 2021-11-22 LAB — CBC
HCT: 41.2 % (ref 39.0–52.0)
Hemoglobin: 14 g/dL (ref 13.0–17.0)
MCH: 28 pg (ref 26.0–34.0)
MCHC: 34 g/dL (ref 30.0–36.0)
MCV: 82.4 fL (ref 80.0–100.0)
Platelets: 184 10*3/uL (ref 150–400)
RBC: 5 MIL/uL (ref 4.22–5.81)
RDW: 12.6 % (ref 11.5–15.5)
WBC: 6 10*3/uL (ref 4.0–10.5)
nRBC: 0 % (ref 0.0–0.2)

## 2021-11-22 LAB — TROPONIN I (HIGH SENSITIVITY): Troponin I (High Sensitivity): 8 ng/L (ref <18)

## 2021-11-22 NOTE — ED Triage Notes (Signed)
Reports elevated bp since this morning.  Reports a landmark nurse came over and checked the bp.  Currently on meds for bp.

## 2021-11-22 NOTE — ED Provider Notes (Signed)
[place] [facility] Provider Note  CSN: [id number] Arrival date & time: 11/22/21 2052  Chief Complaint(s) Hypertension  HPI Kevin Bass is a 81 y.o. male with PMH HTN, A-fib, colon cancer status post partial colectomy, previous CVA who presents emergency department for evaluation of high blood pressure.  Patient states that he had a very mild headache this afternoon and his home health nurse took his blood pressure finding systolics greater than 425 and sent him to the emergency department.  Here in the emergency department, patient has no symptoms including no chest pain, shortness of breath, headache, nausea, vomiting or other systemic complaints.  Initial triage vitals with blood pressures in the 160s.   Past Medical History Past Medical History:  Diagnosis Date   Allergy    Anxiety    Arthritis    hands   Atrial fibrillation ([place])    Colon cancer ([place])    s/p partial colectomy 1990, Cscope neg 7-09, next 2014   DISORDER, MITRAL VALVE    MV recontruction at [place] 2006----still needs ABX for SBE prophylaxis    Elevated PSA    prostate nodule, [name]   GERD (gastroesophageal reflux disease)    Heart murmur    no problems since surgery   HYPERGLYCEMIA, BORDERLINE 10/16/2008   HYPERLIPIDEMIA 06/15/2006   diet controlled   Hypertension    HYPERTROPHY PROSTATE W/O UR OBST & OTH LUTS 10/16/2008   Neuropathy    Right groin pain    Dr. Redmond Pulling   SCC (squamous cell carcinoma) 06/2017   s/p Mohs---[name]   Stroke [facility])    Wears partial dentures    lower partial   Patient Active Problem List   Diagnosis Date Noted   Laryngopharyngeal reflux (LPR) 09/14/2021   Chronic rhinitis 03/29/2019   TIA (transient ischemic attack) 12/03/2017   Chronic anticoagulation 12/03/2017   H/O: stroke 12/03/2017   Paroxysmal atrial fibrillation ([place]) 07/11/2017   SCC (squamous cell carcinoma) 06/22/2017   Varicose veins of both lower extremities 06/19/2017    Bilateral lower extremity edema 06/19/2017   Trichiasis without entropion of right lower eyelid 04/14/2017   Paresthesias 03/03/2017   Trigeminal neuralgia of right side of face 10/01/2016   History of mitral valve repair 09/30/2016   Cerebrovascular accident (CVA) ([place]) 09/12/2016   GERD (gastroesophageal reflux disease) 09/22/2015   PCP NOTES >>>>> 11/08/2014   Chest pain 06/07/2014   Facial neuropathy 04/02/2013   Anxiety and depression 02/26/2013   Annual physical exam 08/30/2012   HTN (hypertension) 01/22/2011   BPH (benign prostatic hyperplasia) 10/16/2008   Hyperglycemia 10/16/2008   Hyperlipidemia 06/15/2006   Mitral valve disease 06/15/2006   Allergic rhinitis 06/15/2006   COLON CANCER, HX OF 06/15/2006   Home Medication(s) Prior to Admission medications   Medication Sig Start Date End Date Taking? Authorizing Provider  acetaminophen (TYLENOL) 650 MG CR tablet Take 1,300 mg by mouth every 8 (eight) hours as needed for pain.    [provider]  ALPRAZolam (XANAX) 0.25 MG tablet TAKE 1 TABLET(0.25 MG) BY MOUTH TWICE DAILY AS NEEDED FOR ANXIETY 06/09/21   Colon Branch, MD  apixaban (ELIQUIS) 5 MG TABS tablet Take 1 tablet (5 mg total) by mouth 2 (two) times daily. 10/27/21   Tobb, Kardie, DO  Arginine 500 MG CAPS Take 1,000 mg by mouth daily.     [provider]  Baclofen 5 MG TABS TAKE 1 TABLET BY MOUTH THREE TIMES DAILY AS NEEDED FOR PAIN 08/17/21   [name],  Lezlie Octave, MD  Coenzyme Q10 100 MG TABS Take 1 tablet by mouth in the morning and at bedtime.    [provider]  gabapentin (NEURONTIN) 300 MG capsule TAKE 5 CAPSULES BY MOUTH FOUR TIMES DAILY AS DIRECTED. TAKE 2 EVERY MORNING, 1 AT 8:30PM, 9:30 PM, AND 10:00PM 11/18/21   Cameron Sprang, MD  Glucosamine-MSM-Hyaluronic Acd (JOINT HEALTH PO) Take 1 capsule by mouth daily.     [provider]  loratadine (CLARITIN) 10 MG tablet Take 10 mg by mouth at bedtime.    [provider]  losartan  (COZAAR) 25 MG tablet Take 2 tablets (50 mg total) by mouth daily. Patient taking differently: Take 75 mg by mouth daily. 3 tablets daily 09/23/21   Colon Branch, MD  metoprolol succinate (TOPROL-XL) 25 MG 24 hr tablet Take 2 tablets (50 mg total) by mouth daily. Patient taking differently: Take 75 mg by mouth daily. 3 tablets daily 09/23/21   Colon Branch, MD  Multiple Vitamins-Minerals (OCUVITE PRESERVISION PO) Take 1 capsule by mouth 2 (two) times daily.     [provider]  nitroGLYCERIN (NITROSTAT) 0.4 MG SL tablet Place 1 tablet (0.4 mg total) under the tongue every 5 (five) minutes as needed for chest pain. 12/12/20   Colon Branch, MD  NON FORMULARY Take 1-2 packets by mouth See admin instructions. Lypo-Spheric vitamin C gel packets- Mix 1-2 packets of gel into juice one to two times a day and drink    [provider]  NON FORMULARY Take 10 mg by mouth See admin instructions. Jarrow Formulas Lyco-Sorb (for Prostate & Cardiovascular Health) 10 mg capsules: Take 10 mg by mouth two times a day    [provider]  Omega-3 Fatty Acids (FISH OIL PO) Take 1 capsule by mouth in the morning and at bedtime.    [provider]  omeprazole (PRILOSEC) 40 MG capsule Take 1 capsule (40 mg total) by mouth daily. Patient taking differently: Take 20 mg by mouth daily. 11/09/21   Colon Branch, MD  OXcarbazepine (TRILEPTAL) 150 MG tablet Take 1/2 tablet twice a day for 3 days, then increase to 1 tablet twice a day 11/18/21   Cameron Sprang, MD  rosuvastatin (CRESTOR) 20 MG tablet Take 1 tablet (20 mg total) by mouth daily. 10/06/21   Colon Branch, MD  Vitamin D, Cholecalciferol, 25 MCG (1000 UT) TABS Take 1 tablet by mouth daily.    [provider]                                                                                                                                    Past Surgical History Past Surgical History:  Procedure Laterality Date   COLECTOMY  1990   HERNIA  REPAIR Left 2014   LIH rep w/mesh- March 2014   LOOP RECORDER INSERTION N/A 12/01/2016   Procedure: LOOP RECORDER INSERTION;  Surgeon: Evans Lance, MD;  Location: [place] [facility];  Service: Cardiovascular;  Laterality: N/A;   MITRAL VALVE ANNULOPLASTY     [facility] 2006   TEE WITHOUT CARDIOVERSION N/A 10/05/2016   Procedure: TRANSESOPHAGEAL ECHOCARDIOGRAM (TEE);  Surgeon: [name], MD;  Location: [facility] ENDOSCOPY;  Service: Cardiovascular;  Laterality: N/A;   Family History Family History  Problem Relation Age of Onset   Colon cancer Other        M side (cousins)   Diabetes Neg Hx    Stroke Neg Hx    CAD Neg Hx    Prostate cancer Neg Hx    Rectal cancer Neg Hx    Esophageal cancer Neg Hx     Social History Social History   Tobacco Use   Smoking status: Former    Packs/day: 0.50    Years: 20.00    Total pack years: 10.00    Types: Cigarettes    Quit date: 02/23/1983    Years since quitting: 38.7   Smokeless tobacco: Never  Vaping Use   Vaping Use: Never used  Substance Use Topics   Alcohol use: Yes    Alcohol/week: 7.0 standard drinks of alcohol    Types: 7 Glasses of wine per week   Drug use: No   Allergies Prednisolone acetate and Statins  Review of Systems Review of Systems  Neurological:  Positive for headaches.    Physical Exam Vital Signs  I have reviewed the triage vital signs BP (!) 150/88 (BP Location: Right Arm)   Pulse 70   Temp 97.8 F (36.6 C) (Oral)   Resp 16   Ht 5\' 10"  (1.778 m)   Wt 79.6 kg   SpO2 97%   BMI 25.18 kg/m   Physical Exam Constitutional:      General: He is not in acute distress.    Appearance: Normal appearance.  HENT:     Head: Normocephalic and atraumatic.     Nose: No congestion or rhinorrhea.  Eyes:     General:        Right eye: No discharge.        Left eye: No discharge.     Extraocular Movements: Extraocular movements intact.     Pupils: Pupils are equal, round, and reactive to light.  Cardiovascular:      Rate and Rhythm: Normal rate and regular rhythm.     Heart sounds: No murmur heard. Pulmonary:     Effort: No respiratory distress.     Breath sounds: No wheezing or rales.  Abdominal:     General: There is no distension.     Tenderness: There is no abdominal tenderness.  Musculoskeletal:        General: Normal range of motion.     Cervical back: Normal range of motion.  Skin:    General: Skin is warm and dry.  Neurological:     General: No focal deficit present.     Mental Status: He is alert.     ED Results and Treatments Labs (all labs ordered are listed, but only abnormal results are displayed) Labs Reviewed  BASIC METABOLIC PANEL - Abnormal; Notable for the following components:      Result Value   Sodium 132 (*)    Glucose, Bld 127 (*)    All other components within normal limits  CBC  TROPONIN I (HIGH SENSITIVITY)  Radiology DG Chest 2 View  Result Date: 11/22/2021 CLINICAL DATA:  Hypertension. EXAM: CHEST - 2 VIEW COMPARISON:  07/05/2017. FINDINGS: The heart size and mediastinal contours are stable. There is atherosclerotic calcification of the aorta. A prosthetic cardiac valve and sternotomy wires are noted. A loop recorder device is present over the left chest. Stable atelectasis or scarring is present at the left lung base. No consolidation, effusion, or pneumothorax. Degenerative changes are noted in the thoracic spine. IMPRESSION: No active cardiopulmonary disease. Electronically Signed   By: Brett Fairy M.D.   On: 11/22/2021 21:38    Pertinent labs & imaging results that were available during my care of the patient were reviewed by me and considered in my medical decision making (see MDM for details).  Medications Ordered in ED Medications - No data to display                                                                                                                                    Procedures Procedures  (including critical care time)  Medical Decision Making / ED Course   This patient presents to the ED for concern of hypertension, this involves an extensive number of treatment options, and is a complaint that carries with it a high risk of complications and morbidity.  The differential diagnosis includes hypertension, hypertensive urgency, hypertensive emergency, medication side effect, failure of outpatient medication regimen  MDM: Patient seen in the emergency department for evaluation of high blood pressure.  Physical exam is unremarkable with no focal motor or sensory deficits.  Laboratory evaluation unremarkable with no evidence of endorgan damage.  Chest x-ray unremarkable with no evidence of pulmonary edema.  Patient's blood pressures hovering in the 150s here with no intervention and current data states that aggressive blood pressure control in the emergency department and the absence of symptoms leads to worse outcomes and thus we will not pursue additional blood pressure treatment.  Patient appears to be taking all of his medications in the morning and it is not unreasonable that his blood pressure would be higher in the afternoon as these medications are starting to wear off.  He was then discharged with outpatient PCP follow-up to discuss his blood pressure regimen.   Additional history obtained: -Additional history obtained from daughter -External records from outside source obtained and reviewed including: Chart review including previous notes, labs, imaging, consultation notes   Lab Tests: -I ordered, reviewed, and interpreted labs.   The pertinent results include:   Labs Reviewed  BASIC METABOLIC PANEL - Abnormal; Notable for the following components:      Result Value   Sodium 132 (*)    Glucose, Bld 127 (*)    All other components within normal limits  CBC  TROPONIN I (HIGH SENSITIVITY)       EKG   EKG Interpretation  Date/Time:  Sunday November 22 2021 21:09:45 EDT Ventricular Rate:  59 PR  Interval:  150 QRS Duration: 88 QT Interval:  398 QTC Calculation: 394 R Axis:   4 Text Interpretation: Sinus bradycardia When compared with ECG of 30-Aug-2019 15:02, PREVIOUS ECG IS PRESENT Confirmed by [place] ([id number]) on 11/23/2021 1:11:07 PM         Imaging Studies ordered: I ordered imaging studies including chest x-ray I independently visualized and interpreted imaging. I agree with the radiologist interpretation   Medicines ordered and prescription drug management: No orders of the defined types were placed in this encounter.   -I have reviewed the patients home medicines and have made adjustments as needed  Critical interventions none    Cardiac Monitoring: The patient was maintained on a cardiac monitor.  I personally viewed and interpreted the cardiac monitored which showed an underlying rhythm of: NSR  Social Determinants of Health:  Factors impacting patients care include: none   Reevaluation: After the interventions noted above, I reevaluated the patient and found that they have :stayed the same  Co morbidities that complicate the patient evaluation  Past Medical History:  Diagnosis Date   Allergy    Anxiety    Arthritis    hands   Atrial fibrillation ([place])    Colon cancer ([place])    s/p partial colectomy 1990, Cscope neg 7-09, next 2014   DISORDER, MITRAL VALVE    MV recontruction at [place] 2006----still needs ABX for SBE prophylaxis    Elevated PSA    prostate nodule, [name]   GERD (gastroesophageal reflux disease)    Heart murmur    no problems since surgery   HYPERGLYCEMIA, BORDERLINE 10/16/2008   HYPERLIPIDEMIA 06/15/2006   diet controlled   Hypertension    HYPERTROPHY PROSTATE W/O UR OBST & OTH LUTS 10/16/2008   Neuropathy    Right groin pain    Dr. Redmond Pulling   SCC (squamous cell carcinoma) 06/2017   s/p Mohs---[name]    Stroke [facility])    Wears partial dentures    lower partial      Dispostion: I considered admission for this patient, but patient currently does not meet inpatient criteria for admission is safe for discharge with outpatient follow-up     Final Clinical Impression(s) / ED Diagnoses Final diagnoses:  Secondary hypertension     @PCDICTATION @    Teressa Lower, MD 11/23/21 1311

## 2021-11-23 ENCOUNTER — Other Ambulatory Visit: Payer: Self-pay | Admitting: Internal Medicine

## 2021-11-25 ENCOUNTER — Ambulatory Visit (INDEPENDENT_AMBULATORY_CARE_PROVIDER_SITE_OTHER): Payer: PPO | Admitting: Internal Medicine

## 2021-11-25 ENCOUNTER — Encounter: Payer: Self-pay | Admitting: Internal Medicine

## 2021-11-25 VITALS — BP 126/74 | HR 62 | Temp 98.4°F | Resp 16 | Ht 70.0 in | Wt 173.5 lb

## 2021-11-25 DIAGNOSIS — G500 Trigeminal neuralgia: Secondary | ICD-10-CM

## 2021-11-25 DIAGNOSIS — Z23 Encounter for immunization: Secondary | ICD-10-CM

## 2021-11-25 DIAGNOSIS — I10 Essential (primary) hypertension: Secondary | ICD-10-CM | POA: Diagnosis not present

## 2021-11-25 MED ORDER — METOPROLOL SUCCINATE ER 25 MG PO TB24: 75.0000 mg | ORAL_TABLET | Freq: Every day | ORAL | Status: DC

## 2021-11-25 MED ORDER — LOSARTAN POTASSIUM 25 MG PO TABS: 75.0000 mg | ORAL_TABLET | Freq: Every day | ORAL | Status: DC

## 2021-11-25 NOTE — Patient Instructions (Addendum)
Take the medicines as recommended and continue checking your blood pressures regularly.   BP GOAL is between 110/65 and  135/85. If it is consistently higher or lower, let me know    GO TO THE LAB : Get the blood work    See you at the end of the month for your physical     Vaccines I recommend:  Covid booster RSV vaccine

## 2021-11-25 NOTE — Progress Notes (Unsigned)
Subjective:    Patient ID: Kevin Bass, male    DOB: 26-Dec-1940, 81 y.o.   MRN: [id number]  DOS:  11/25/2021 Type of visit - description:   Martin Majestic to the ER 3 days ago. That day, he was evaluated by home health nurse, due to elevated BP at home and a mild headache was referred to emergency room. Labs: BMP with a sodium of 133.  Creatinine normal.  CBC normal.  Troponin negative. BP was in the 150s, physical exam benign, was recommended to follow-up here.  At this point he is feeling better. He realized that since he is started oxcarbazepine for trigeminal neuralgia his BP started to go up. He has stopped it and in the last 2 days BPs are much better in the 120s, 130s right.  Today he denies any headache, chest pain or difficulty breathing.    Review of Systems See above   Past Medical History:  Diagnosis Date   Allergy    Anxiety    Arthritis    hands   Atrial fibrillation ([place])    Colon cancer ([place])    s/p partial colectomy 1990, Cscope neg 7-09, next 2014   DISORDER, MITRAL VALVE    MV recontruction at [place] 2006----still needs ABX for SBE prophylaxis    Elevated PSA    prostate nodule, [name]   GERD (gastroesophageal reflux disease)    Heart murmur    no problems since surgery   HYPERGLYCEMIA, BORDERLINE 10/16/2008   HYPERLIPIDEMIA 06/15/2006   diet controlled   Hypertension    HYPERTROPHY PROSTATE W/O UR OBST & OTH LUTS 10/16/2008   Neuropathy    Right groin pain    Dr. Redmond Pulling   SCC (squamous cell carcinoma) 06/2017   s/p Mohs---[name]   Stroke [facility])    Wears partial dentures    lower partial    Past Surgical History:  Procedure Laterality Date   COLECTOMY  1990   HERNIA REPAIR Left 2014   [facility] rep w/mesh- March 2014   LOOP RECORDER INSERTION N/A 12/01/2016   Procedure: LOOP RECORDER INSERTION;  Surgeon: Evans Lance, MD;  Location: [place] [facility];  Service: Cardiovascular;  Laterality: N/A;   MITRAL VALVE ANNULOPLASTY     [facility] 2006    TEE WITHOUT CARDIOVERSION N/A 10/05/2016   Procedure: TRANSESOPHAGEAL ECHOCARDIOGRAM (TEE);  Surgeon: [name], MD;  Location: [facility] ENDOSCOPY;  Service: Cardiovascular;  Laterality: N/A;    Current Outpatient Medications  Medication Instructions   acetaminophen (TYLENOL) 1,300 mg, Oral, Every 8 hours PRN   ALPRAZolam (XANAX) 0.25 MG tablet TAKE 1 TABLET(0.25 MG) BY MOUTH TWICE DAILY AS NEEDED FOR ANXIETY   amLODipine (NORVASC) 5 mg, Oral, Daily   apixaban (ELIQUIS) 5 mg, Oral, 2 times daily   Arginine 1,000 mg, Oral, Daily   Baclofen 5 MG TABS TAKE 1 TABLET BY MOUTH THREE TIMES DAILY AS NEEDED FOR PAIN   Coenzyme Q10 100 MG TABS 1 tablet, Oral, 2 times daily   gabapentin (NEURONTIN) 300 MG capsule TAKE 5 CAPSULES BY MOUTH FOUR TIMES DAILY AS DIRECTED. TAKE 2 EVERY MORNING, 1 AT 8:30PM, 9:30 PM, AND 10:00PM   Glucosamine-MSM-Hyaluronic Acd (JOINT HEALTH PO) 1 capsule, Oral, Daily   loratadine (CLARITIN) 10 mg, Oral, Daily at bedtime   losartan (COZAAR) 75 mg, Oral, Daily   metoprolol succinate (TOPROL-XL) 75 mg, Oral, Daily   Multiple Vitamins-Minerals (OCUVITE PRESERVISION PO) 1 capsule, Oral, 2 times daily   nitroGLYCERIN (NITROSTAT) 0.4 mg, Sublingual, Every  5 min PRN   NON FORMULARY 1-2 packets, Oral, See admin instructions, Lypo-Spheric vitamin C gel packets- Mix 1-2 packets of gel into juice one to two times a day and drink    NON FORMULARY 10 mg, Oral, See admin instructions, [employer] Formulas Lyco-Sorb (for Prostate & Cardiovascular Health) 10 mg capsules: Take 10 mg by mouth two times a day   Omega-3 Fatty Acids (FISH OIL PO) 1 capsule, Oral, 2 times daily   omeprazole (PRILOSEC) 40 mg, Oral, Daily   rosuvastatin (CRESTOR) 20 mg, Oral, Daily   Vitamin D, Cholecalciferol, 25 MCG (1000 UT) TABS 1 tablet, Oral, Daily       Objective:   Physical Exam BP 126/74   Pulse 62   Temp 98.4 F (36.9 C) (Oral)   Resp 16   Ht 5\' 10"  (1.778 m)   Wt 173 lb 8 oz (78.7 kg)   SpO2 95%    BMI 24.89 kg/m  General:   Well developed, NAD, BMI noted. HEENT:  Normocephalic . Face symmetric, atraumatic Lungs:  CTA B Normal respiratory effort, no intercostal retractions, no accessory muscle use. Heart: RRR,  no murmur.  Lower extremities: no pretibial edema bilaterally  Skin: Not pale. Not jaundice Neurologic:  alert & oriented X3.  Speech normal, gait appropriate for age and unassisted Psych--  Cognition and judgment appear intact.  Cooperative with normal attention span and concentration.  Behavior appropriate. No anxious or depressed appearing.      Assessment     Assessment Prediabetes HTN Hyperlipidemia, statin intolerant (simva-pravachol: shoulder pain) Anxiety- rarely takes xanax Neuro  -- Trigeminal neuralgia  dx 2015, no w/u, sx resurface x1 after temporary d/c of meds  --saw neuro again 02/2017, labs (-), had a NCS, Rx gaba -- strokes CV: --Mitral valve annuloplasty 2006-- needs SBE prophylaxis -- SOB 04-2016, [facility] at [facility], had a ECHO Nl fx MV, cath: no CAD --Cryptogenic stroke 09/12/2016, had slurred speech. 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 [name] Colon cancer partial colectomy 1990, colonoscopy 08-2007 negative, Cscope again 09-2012 normal, 5 years  SCC . MOHS 06/2017  PLAN: HTN: Since the last visit, he has been taking amlodipine 5 mg daily, losartan 75 mg daily, metoprolol 75 mg daily   (although we agreed on take only 50 mg of each).  His BP was relatively well controlled until he started oxcarbazepine about a week ago. The elevated BP and headache led to a  ER visit, work-up negative except for a low sodium.  He has a stop oxcarbazepine and now his BP is normal. Plan: Continue losartan 75 mg daily, metoprolol  75 mg daily, amlodipine 5 mg daily.  Continue monitoring BPs.  Recheck a BMP. Trigeminal neuralgia: 7.  Oxcarbazepine because he feels it increases his BP, she is not sure if helped much with his symptoms, declines a rechallenge.  Will let neurology know. RTC already scheduled for the end of the month  8-2 HTN: BP has been running low. Patient stopped amlodipine about a month ago For the last week BP has been in the low side mostly around 4 PM. He self decrease metoprolol dose to 50 mg qd 2 days ago, yet BP was low yesterday. Plan: Check CBC and TSH Decrease losartan 25 mg to two tablets qd Continue  with low-dose of metoprolol 2 tablets daily He already stopped amlodipine. Monitor BPs, call with readings. Further advised with results and readings.  Also advised patient that we may need to tolerate a slightly elevated blood pressure up to the 140s to prevent hypotension which can lead to falls and other problems.  He verbalized understanding. Follow-up scheduled for October.

## 2021-11-26 LAB — BASIC METABOLIC PANEL
BUN: 17 mg/dL (ref 6–23)
CO2: 31 mEq/L (ref 19–32)
Calcium: 9.4 mg/dL (ref 8.4–10.5)
Chloride: 99 mEq/L (ref 96–112)
Creatinine, Ser: 1.08 mg/dL (ref 0.40–1.50)
GFR: 63.57 mL/min (ref >60.00)
Glucose, Bld: 109 mg/dL — ABNORMAL HIGH (ref 70–99)
Potassium: 4.6 mEq/L (ref 3.5–5.1)
Sodium: 136 mEq/L (ref 135–145)

## 2021-11-26 NOTE — Assessment & Plan Note (Signed)
HTN: Since the last visit, he has been taking amlodipine 5 mg daily, losartan 75 mg daily, metoprolol 75 mg daily   (although we agreed on take only 50 mg of each).  His BP was relatively well controlled until he started oxcarbazepine about a week ago. The elevated BP and headache led to a  ER visit, work-up negative except for a low sodium.  He self d/c  oxcarbazepine and now his BP is normal. Plan: Continue losartan 75 mg daily, metoprolol 75 mg daily, amlodipine 5 mg daily.  Continue monitoring BPs.  Recheck a BMP. Trigeminal neuralgia:  self d/c   Oxcarbazepine because he feels it increases his BP, she is not sure if the medication helped much with his symptoms, declines a rechallenge.    RTC already scheduled for the end of the month

## 2021-11-27 DIAGNOSIS — I10 Essential (primary) hypertension: Secondary | ICD-10-CM | POA: Diagnosis not present

## 2021-11-29 ENCOUNTER — Encounter: Payer: Self-pay | Admitting: Neurology

## 2021-12-01 ENCOUNTER — Other Ambulatory Visit: Payer: Self-pay | Admitting: Internal Medicine

## 2021-12-09 ENCOUNTER — Other Ambulatory Visit: Payer: Self-pay | Admitting: Neurology

## 2021-12-09 ENCOUNTER — Telehealth: Payer: Self-pay | Admitting: Neurology

## 2021-12-09 MED ORDER — GABAPENTIN 300 MG PO CAPS: ORAL_CAPSULE | ORAL | 3 refills | Status: DC

## 2021-12-09 NOTE — Telephone Encounter (Signed)
1. Which medications need refilled? (List name and dosage, if known) gabapentin, increased from 4 to 6 by doctor  2. Which pharmacy/location is medication to be sent to? (include street and city if local pharmacy) [facility] on [address]  Patient states pharmacy needs an updated Rx.

## 2021-12-09 NOTE — Telephone Encounter (Signed)
Pt is talking to Dr [name] in My Chart , to get medication refilled,

## 2021-12-14 ENCOUNTER — Encounter: Payer: PPO | Admitting: Internal Medicine

## 2021-12-14 ENCOUNTER — Telehealth: Payer: Self-pay | Admitting: Neurology

## 2021-12-14 ENCOUNTER — Telehealth: Payer: Self-pay | Admitting: Internal Medicine

## 2021-12-14 MED ORDER — OXCARBAZEPINE 150 MG PO TABS: ORAL_TABLET | ORAL | 0 refills | Status: DC

## 2021-12-14 NOTE — Telephone Encounter (Signed)
PDMP okay, Rx sent 

## 2021-12-14 NOTE — Telephone Encounter (Signed)
Would do one change at a time. Go ahead with increasing the oxcarbazepine as tolerated 75mg  in the morning and 150 mg at night

## 2021-12-14 NOTE — Telephone Encounter (Signed)
He stated he seen his eye dr and has a new prescription, he started taken the  Oxcarbazepine cutting it taken 75 mg BID he is asking if you would like for him to keep taken that or take 75mg  in the morning and 150 mg at night? And still increase the Baclofen? He stated that when he yawns he has the shocking feeling as well

## 2021-12-14 NOTE — Telephone Encounter (Signed)
Would do one change at a time. Go ahead with increasing the oxcarbazepine as tolerated, thanks

## 2021-12-14 NOTE — Telephone Encounter (Signed)
Requesting: alprazolam 0.25mg   Contract: 08/24/21 UDS: 08/24/21 Last Visit: 11/25/21 Next Visit: None Last Refill: 06/09/21 #60 and 0RF   Please Advise

## 2021-12-14 NOTE — Telephone Encounter (Signed)
Pls have him increase the Baclofen 5mg : Take 1 and 1/2 tablets three times a day. If too sedating, he can try taking the 1 and 1/2 tablets at bedtime but stay on 1 tab in AM and noon. He has to call his eye doctor if his vision is diminished. Thanks

## 2021-12-14 NOTE — Telephone Encounter (Signed)
Patient called to report that for the last two days he's experienced an electrical shock type of feeling in his right eye and cheek.  He is unable to eat and his vision is diminished he said.

## 2021-12-15 ENCOUNTER — Ambulatory Visit (INDEPENDENT_AMBULATORY_CARE_PROVIDER_SITE_OTHER): Payer: PPO | Admitting: *Deleted

## 2021-12-15 DIAGNOSIS — Z0000 Encounter for general adult medical examination without abnormal findings: Secondary | ICD-10-CM | POA: Diagnosis not present

## 2021-12-15 NOTE — Progress Notes (Cosign Needed)
Subjective:   Kevin Bass is a 81 y.o. male who presents for Medicare Annual/Subsequent preventive examination.  I connected with  Kevin Bass on 12/15/21 by a audio enabled telemedicine application and verified that I am speaking with the correct person using two identifiers.  Patient Location: Home  Provider Location: Office/Clinic  I discussed the limitations of evaluation and management by telemedicine. The patient expressed understanding and agreed to proceed.   Review of Systems    Defer to PCP Cardiac Risk Factors include: advanced age (>63men, >36 women);dyslipidemia;male gender;hypertension     Objective:    There were no vitals filed for this visit. There is no height or weight on file to calculate BMI.     12/15/2021    1:45 PM 11/22/2021    9:03 PM 11/18/2021    8:39 AM 08/17/2021    2:58 PM 12/10/2020    2:35 PM 06/03/2020    3:20 PM 10/31/2019    3:26 PM  Advanced Directives  Does Patient Have a Medical Advance Directive? Yes No Yes Yes Yes Yes Yes  Type of Paramedic of [place];Living will        Does patient want to make changes to medical advance directive? No - Patient declined        Copy of [place] in Chart? No - copy requested        Would patient like information on creating a medical advance directive?  No - Patient declined         Current Medications (verified) Outpatient Encounter Medications as of 12/15/2021  Medication Sig   acetaminophen (TYLENOL) 650 MG CR tablet Take 1,300 mg by mouth every 8 (eight) hours as needed for pain.   ALPRAZolam (XANAX) 0.25 MG tablet TAKE 1 TABLET(0.25 MG) BY MOUTH TWICE DAILY AS NEEDED FOR ANXIETY   amLODipine (NORVASC) 5 MG tablet Take 5 mg by mouth daily.   apixaban (ELIQUIS) 5 MG TABS tablet Take 1 tablet (5 mg total) by mouth 2 (two) times daily.   Arginine 500 MG CAPS Take 1,000 mg by mouth daily.    Baclofen 5 MG TABS TAKE 1 TABLET BY MOUTH THREE  TIMES DAILY AS NEEDED FOR PAIN   Coenzyme Q10 100 MG TABS Take 1 tablet by mouth in the morning and at bedtime.   gabapentin (NEURONTIN) 300 MG capsule Take 3 capsules twice a day   Glucosamine-MSM-Hyaluronic Acd (JOINT HEALTH PO) Take 1 capsule by mouth daily.    loratadine (CLARITIN) 10 MG tablet Take 10 mg by mouth at bedtime.   losartan (COZAAR) 25 MG tablet Take 3 tablets (75 mg total) by mouth daily.   metoprolol succinate (TOPROL-XL) 25 MG 24 hr tablet Take 3 tablets (75 mg total) by mouth daily. Take with or immediately following a meal   Multiple Vitamins-Minerals (OCUVITE PRESERVISION PO) Take 1 capsule by mouth 2 (two) times daily.    nitroGLYCERIN (NITROSTAT) 0.4 MG SL tablet Place 1 tablet (0.4 mg total) under the tongue every 5 (five) minutes as needed for chest pain. (Patient not taking: Reported on 11/25/2021)   NON FORMULARY Take 1-2 packets by mouth See admin instructions. Lypo-Spheric vitamin C gel packets- Mix 1-2 packets of gel into juice one to two times a day and drink   NON FORMULARY Take 10 mg by mouth See admin instructions. [employer] Lyco-Sorb (for Prostate & Cardiovascular Health) 10 mg capsules: Take 10 mg by mouth two times a day   Omega-3 Fatty  Acids (FISH OIL PO) Take 1 capsule by mouth in the morning and at bedtime.   omeprazole (PRILOSEC) 40 MG capsule Take 1 capsule (40 mg total) by mouth daily. (Patient taking differently: Take 20 mg by mouth daily.)   OXcarbazepine (TRILEPTAL) 150 MG tablet 75mg  in the morning and 150 mg at night   rosuvastatin (CRESTOR) 20 MG tablet Take 1 tablet (20 mg total) by mouth daily.   Vitamin D, Cholecalciferol, 25 MCG (1000 UT) TABS Take 1 tablet by mouth daily.   No facility-administered encounter medications on file as of 12/15/2021.    Allergies (verified) Prednisolone acetate and Statins   History: Past Medical History:  Diagnosis Date   Allergy    Anxiety    Arthritis    hands   Atrial fibrillation ([place])     Colon cancer ([place])    s/p partial colectomy 1990, Cscope neg 7-09, next 2014   DISORDER, MITRAL VALVE    MV recontruction at [place] 2006----still needs ABX for SBE prophylaxis    Elevated PSA    prostate nodule, [name]   GERD (gastroesophageal reflux disease)    Heart murmur    no problems since surgery   HYPERGLYCEMIA, BORDERLINE 10/16/2008   HYPERLIPIDEMIA 06/15/2006   diet controlled   Hypertension    HYPERTROPHY PROSTATE W/O UR OBST & OTH LUTS 10/16/2008   Neuropathy    Right groin pain    Dr. Redmond Pulling   SCC (squamous cell carcinoma) 06/2017   s/p Mohs---[name]   Stroke [facility])    Wears partial dentures    lower partial   Past Surgical History:  Procedure Laterality Date   COLECTOMY  1990   HERNIA REPAIR Left 2014   [facility] rep w/mesh- March 2014   LOOP RECORDER INSERTION N/A 12/01/2016   Procedure: LOOP RECORDER INSERTION;  Surgeon: Evans Lance, MD;  Location: [place] [facility] CV LAB;  Service: Cardiovascular;  Laterality: N/A;   MITRAL VALVE ANNULOPLASTY     [facility] 2006   TEE WITHOUT CARDIOVERSION N/A 10/05/2016   Procedure: TRANSESOPHAGEAL ECHOCARDIOGRAM (TEE);  Surgeon: [name], MD;  Location: [facility] ENDOSCOPY;  Service: Cardiovascular;  Laterality: N/A;   Family History  Problem Relation Age of Onset   Colon cancer Other        M side (cousins)   Diabetes Neg Hx    Stroke Neg Hx    CAD Neg Hx    Prostate cancer Neg Hx    Rectal cancer Neg Hx    Esophageal cancer Neg Hx    Social History   Socioeconomic History   Marital status: Widowed    Spouse name: Kevin Bass   Number of children: 2   Years of education: college   Highest education level: Not on file  Occupational History   Occupation: retired   Tobacco Use   Smoking status: Former    Packs/day: 0.50    Years: 20.00    Total pack years: 10.00    Types: Cigarettes    Quit date: 02/23/1983    Years since quitting: 38.8   Smokeless tobacco: Never  Vaping Use   Vaping Use: Never used  Substance and  Sexual Activity   Alcohol use: Yes    Alcohol/week: 7.0 standard drinks of alcohol    Types: 7 Glasses of wine per week   Drug use: No   Sexual activity: Not Currently  Other Topics Concern   Not on file  Social History Narrative   From [place], lost wife 08/2017  to cancer   Lives by himself    Daughter in [place], son in [place]   Right handed      Social Determinants of Health   Financial Resource Strain: Medium Risk (10/06/2021)   Overall Financial Resource Strain (CARDIA)    Difficulty of Paying Living Expenses: Somewhat hard  Food Insecurity: No Food Insecurity (12/15/2021)   Hunger Vital Sign    Worried About Running Out of Food in the Last Year: Never true    Ran Out of Food in the Last Year: Never true  Transportation Needs: No Transportation Needs (12/15/2021)   PRAPARE - Hydrologist (Medical): No    Lack of Transportation (Non-Medical): No  Physical Activity: Insufficiently Active (10/06/2021)   Exercise Vital Sign    Days of Exercise per Week: 2 days    Minutes of Exercise per Session: 30 min  Stress: No Stress Concern Present (12/15/2021)   [place]    Feeling of Stress : Not at all  Social Connections: Moderately Isolated (12/15/2021)   Social Connection and Isolation Panel [NHANES]    Frequency of Communication with Friends and Family: More than three times a week    Frequency of Social Gatherings with Friends and Family: Three times a week    Attends Religious Services: 1 to 4 times per year    Active Member of Clubs or Organizations: No    Attends Archivist Meetings: Never    Marital Status: Widowed    Tobacco Counseling Counseling given: Not Answered   Clinical Intake:  Pre-visit preparation completed: Yes  Pain : No/denies pain  How often do you need to have someone help you when you read instructions, pamphlets, or other written materials from your  doctor or pharmacy?: 1 - Never  Diabetic? No  Activities of Daily Living    12/15/2021    1:45 PM  In your present state of health, do you have any difficulty performing the following activities:  Hearing? 0  Vision? 0  Difficulty concentrating or making decisions? 1  Walking or climbing stairs? 0  Dressing or bathing? 0  Doing errands, shopping? 0  Preparing Food and eating ? N  Using the Toilet? N  In the past six months, have you accidently leaked urine? N  Do you have problems with loss of bowel control? N  Managing your Medications? N  Managing your Finances? N  Housekeeping or managing your Housekeeping? N    Patient Care Team: Colon Branch, MD as PCP - General Lovena Le Champ Mungo, MD as PCP - Cardiology (Cardiology) Raynelle Bring, MD as Consulting Physician (Urology) Valaria Good, MD as Referring Physician (Cardiology) Antionette Fairy Isaias Cowman, MD as Consulting Physician (Ophthalmology) Cameron Sprang, MD as Consulting Physician (Neurology) [name], RPH-CPP (Pharmacist)  Indicate any recent Medical Services you may have received from other than [facility] providers in the past year (date may be approximate).     Assessment:   This is a routine wellness examination for Yee.  Hearing/Vision screen No results found.  Dietary issues and exercise activities discussed: Current Exercise Habits: Home exercise routine, Type of exercise: walking, Time (Minutes): 20, Frequency (Times/Week): 7 (weather permitting), Weekly Exercise (Minutes/Week): 140, Intensity: Mild, Exercise limited by: None identified   Goals Addressed   None    Depression Screen    12/15/2021    1:45 PM 11/25/2021    1:20 PM 09/23/2021    1:03 PM 08/24/2021  1:46 PM 12/12/2020    1:34 PM 08/08/2020    3:28 PM 06/11/2020    1:40 PM  PHQ 2/9 Scores  PHQ - 2 Score 0 0 0 0 1 2 0  PHQ- 9 Score   1 1 2 5 1     Fall Risk    12/15/2021    1:45 PM 11/25/2021    1:19 PM 11/18/2021    8:39 AM  09/23/2021    1:03 PM 08/24/2021    1:16 PM  Fall Risk   Falls in the past year? 0 0 0 0 0  Number falls in past yr: 0 0 0 0 0  Injury with Fall? 0 0 0 0 0  Risk for fall due to : No Fall Risks      Follow up Falls evaluation completed Falls evaluation completed  Falls evaluation completed Falls evaluation completed    [place]:  Any stairs in or around the home? Yes  If so, are there any without handrails? No  Home free of loose throw rugs in walkways, pet beds, electrical cords, etc? Yes  Adequate lighting in your home to reduce risk of falls? Yes   ASSISTIVE DEVICES UTILIZED TO PREVENT FALLS:  Life alert? No  Use of a cane, walker or w/c? No  Grab bars in the bathroom? No  Shower chair or bench in shower? No  Elevated toilet seat or a handicapped toilet? No   TIMED UP AND GO:  Was the test performed?  Audio visit .    Cognitive Function:    12/15/2021    1:51 PM 03/02/2016    1:59 PM  MMSE - Mini Mental State Exam  Not completed: Refused   Orientation to time  5  Orientation to Place  5  Registration  3  Attention/ Calculation  5  Recall  2  Language- name 2 objects  2  Language- repeat  1  Language- follow 3 step command  3  Language- read & follow direction  1  Write a sentence  1  Copy design  1  Total score  29        Immunizations Immunization History  Administered Date(s) Administered   Fluad Quad(high Dose 65+) 11/13/2018, 12/12/2020, 11/25/2021   Influenza Whole 03/10/2007, 11/17/2007   Influenza-Unspecified 01/17/2017   PFIZER(Purple Top)SARS-COV-2 Vaccination 04/17/2019, 05/08/2019, 01/02/2020   Pneumococcal Conjugate-13 01/16/2014   Pneumococcal Polysaccharide-23 01/12/2005, 04/21/2010, 06/11/2020   Td 06/15/2006, 01/05/2015   Tdap 12/13/2016   Zoster Recombinat (Shingrix) 02/03/2017, 12/14/2018   Zoster, Live 03/25/1998    TDAP status: Up to date  Flu Vaccine status: Up to date  Pneumococcal vaccine  status: Up to date  Covid-19 vaccine status: Information provided on how to obtain vaccines.   Qualifies for Shingles Vaccine? Yes   Zostavax completed Yes   Shingrix Completed?: Yes  Screening Tests Health Maintenance  Topic Date Due   Medicare Annual Wellness (AWV)  Never done   COVID-19 Vaccine (4 - Pfizer risk series) 02/27/2020   TETANUS/TDAP  12/14/2026   Pneumonia Vaccine 72+ Years old  Completed   INFLUENZA VACCINE  Completed   Zoster Vaccines- Shingrix  Completed   HPV VACCINES  Aged Out    Health Maintenance  Health Maintenance Due  Topic Date Due   Medicare Annual Wellness (AWV)  Never done   COVID-19 Vaccine (4 - Pfizer risk series) 02/27/2020    Colorectal cancer screening: No longer required.   Lung Cancer Screening: (  Low Dose CT Chest recommended if Age 4-80 years, 30 pack-year currently smoking OR have quit w/in 15years.) does not qualify.   Lung Cancer Screening Referral: N/a  Additional Screening:  Hepatitis C Screening: does not qualify  Vision Screening: Recommended annual ophthalmology exams for early detection of glaucoma and other disorders of the eye. Is the patient up to date with their annual eye exam?  Yes  Who is the provider or what is the name of the office in which the patient attends annual eye exams? Dr. Letta Pate If pt is not established with a provider, would they like to be referred to a provider to establish care? No .   Dental Screening: Recommended annual dental exams for proper oral hygiene  Community Resource Referral / Chronic Care Management: CRR required this visit?  No   CCM required this visit?  No      Plan:     I have personally reviewed and noted the following in the patient's chart:   Medical and social history Use of alcohol, tobacco or illicit drugs  Current medications and supplements including opioid prescriptions. Patient is not currently taking opioid prescriptions. Functional ability and  status Nutritional status Physical activity Advanced directives List of other physicians Hospitalizations, surgeries, and ER visits in previous 12 months Vitals Screenings to include cognitive, depression, and falls Referrals and appointments  In addition, I have reviewed and discussed with patient certain preventive protocols, quality metrics, and best practice recommendations. A written personalized care plan for preventive services as well as general preventive health recommendations were provided to patient.   Due to this being a telephonic visit, the after visit summary with patients personalized plan was offered to patient via mail or my-chart. Per request, patient was mailed a copy of AVS.  Beatris Ship, [place]   12/15/2021   Nurse Notes: None   I have reviewed and agree with [facility] documentation.  Kathlene November, MD

## 2021-12-15 NOTE — Patient Instructions (Signed)
Kevin Bass , Thank you for taking time to come for your Medicare Wellness Visit. I appreciate your ongoing commitment to your health goals. Please review the following plan we discussed and let me know if I can assist you in the future.   These are the goals we discussed:  Goals      A1c goal less than 6.5%     Avoid medications that can increase bleeding risk while taking Eliquis     Blood pressure goal less than 140/90     Check blood pressure 3-4 times weekly and record readings     Continue traveling      Practice moderation with carbohydrates     Stay physically active        This is a list of the screening recommended for you and due dates:  Health Maintenance  Topic Date Due   COVID-19 Vaccine (4 - Pfizer risk series) 02/27/2020   Medicare Annual Wellness Visit  01/15/2023   Tetanus Vaccine  12/14/2026   Pneumonia Vaccine  Completed   Flu Shot  Completed   Zoster (Shingles) Vaccine  Completed   HPV Vaccine  Aged Out     Next appointment: Follow up in one year for your annual wellness visit.   Preventive Care 20 Years and Older, Male Preventive care refers to lifestyle choices and visits with your health care provider that can promote health and wellness. What does preventive care include? A yearly physical exam. This is also called an annual well check. Dental exams once or twice a year. Routine eye exams. Ask your health care provider how often you should have your eyes checked. Personal lifestyle choices, including: Daily care of your teeth and gums. Regular physical activity. Eating a healthy diet. Avoiding tobacco and drug use. Limiting alcohol use. Practicing safe sex. Taking low doses of aspirin every day. Taking vitamin and mineral supplements as recommended by your health care provider. What happens during an annual well check? The services and screenings done by your health care provider during your annual well check will depend on your age, overall  health, lifestyle risk factors, and family history of disease. Counseling  Your health care provider may ask you questions about your: Alcohol use. Tobacco use. Drug use. Emotional well-being. Home and relationship well-being. Sexual activity. Eating habits. History of falls. Memory and ability to understand (cognition). Work and work Statistician. Screening  You may have the following tests or measurements: Height, weight, and BMI. Blood pressure. Lipid and cholesterol levels. These may be checked every 5 years, or more frequently if you are over 78 years old. Skin check. Lung cancer screening. You may have this screening every year starting at age 73 if you have a 30-pack-year history of smoking and currently smoke or have quit within the past 15 years. Fecal occult blood test (FOBT) of the stool. You may have this test every year starting at age 76. Flexible sigmoidoscopy or colonoscopy. You may have a sigmoidoscopy every 5 years or a colonoscopy every 10 years starting at age 91. Prostate cancer screening. Recommendations will vary depending on your family history and other risks. Hepatitis C blood test. Hepatitis B blood test. Sexually transmitted disease (STD) testing. Diabetes screening. This is done by checking your blood sugar (glucose) after you have not eaten for a while (fasting). You may have this done every 1-3 years. Abdominal aortic aneurysm (AAA) screening. You may need this if you are a current or former smoker. Osteoporosis. You may be screened  starting at age 36 if you are at high risk. Talk with your health care provider about your test results, treatment options, and if necessary, the need for more tests. Vaccines  Your health care provider may recommend certain vaccines, such as: Influenza vaccine. This is recommended every year. Tetanus, diphtheria, and acellular pertussis (Tdap, Td) vaccine. You may need a Td booster every 10 years. Zoster vaccine. You may  need this after age 75. Pneumococcal 13-valent conjugate (PCV13) vaccine. One dose is recommended after age 60. Pneumococcal polysaccharide (PPSV23) vaccine. One dose is recommended after age 29. Talk to your health care provider about which screenings and vaccines you need and how often you need them. This information is not intended to replace advice given to you by your health care provider. Make sure you discuss any questions you have with your health care provider. Document Released: 03/07/2015 Document Revised: 10/29/2015 Document Reviewed: 12/10/2014 Elsevier Interactive Patient Education  2017 [place] Prevention in the Home Falls can cause injuries. They can happen to people of all ages. There are many things you can do to make your home safe and to help prevent falls. What can I do on the outside of my home? Regularly fix the edges of walkways and driveways and fix any cracks. Remove anything that might make you trip as you walk through a door, such as a raised step or threshold. Trim any bushes or trees on the path to your home. Use bright outdoor lighting. Clear any walking paths of anything that might make someone trip, such as rocks or tools. Regularly check to see if handrails are loose or broken. Make sure that both sides of any steps have handrails. Any raised decks and porches should have guardrails on the edges. Have any leaves, snow, or ice cleared regularly. Use sand or salt on walking paths during winter. Clean up any spills in your garage right away. This includes oil or grease spills. What can I do in the bathroom? Use night lights. Install grab bars by the toilet and in the tub and shower. Do not use towel bars as grab bars. Use non-skid mats or decals in the tub or shower. If you need to sit down in the shower, use a plastic, non-slip stool. Keep the floor dry. Clean up any water that spills on the floor as soon as it happens. Remove soap buildup in  the tub or shower regularly. Attach bath mats securely with double-sided non-slip rug tape. Do not have throw rugs and other things on the floor that can make you trip. What can I do in the bedroom? Use night lights. Make sure that you have a light by your bed that is easy to reach. Do not use any sheets or blankets that are too big for your bed. They should not hang down onto the floor. Have a firm chair that has side arms. You can use this for support while you get dressed. Do not have throw rugs and other things on the floor that can make you trip. What can I do in the kitchen? Clean up any spills right away. Avoid walking on wet floors. Keep items that you use a lot in easy-to-reach places. If you need to reach something above you, use a strong step stool that has a grab bar. Keep electrical cords out of the way. Do not use floor polish or wax that makes floors slippery. If you must use wax, use non-skid floor wax. Do not have throw  rugs and other things on the floor that can make you trip. What can I do with my stairs? Do not leave any items on the stairs. Make sure that there are handrails on both sides of the stairs and use them. Fix handrails that are broken or loose. Make sure that handrails are as long as the stairways. Check any carpeting to make sure that it is firmly attached to the stairs. Fix any carpet that is loose or worn. Avoid having throw rugs at the top or bottom of the stairs. If you do have throw rugs, attach them to the floor with carpet tape. Make sure that you have a light switch at the top of the stairs and the bottom of the stairs. If you do not have them, ask someone to add them for you. What else can I do to help prevent falls? Wear shoes that: Do not have high heels. Have rubber bottoms. Are comfortable and fit you well. Are closed at the toe. Do not wear sandals. If you use a stepladder: Make sure that it is fully opened. Do not climb a closed  stepladder. Make sure that both sides of the stepladder are locked into place. Ask someone to hold it for you, if possible. Clearly mark and make sure that you can see: Any grab bars or handrails. First and last steps. Where the edge of each step is. Use tools that help you move around (mobility aids) if they are needed. These include: Canes. Walkers. Scooters. Crutches. Turn on the lights when you go into a dark area. Replace any light bulbs as soon as they burn out. Set up your furniture so you have a clear path. Avoid moving your furniture around. If any of your floors are uneven, fix them. If there are any pets around you, be aware of where they are. Review your medicines with your doctor. Some medicines can make you feel dizzy. This can increase your chance of falling. Ask your doctor what other things that you can do to help prevent falls. This information is not intended to replace advice given to you by your health care provider. Make sure you discuss any questions you have with your health care provider. Document Released: 12/05/2008 Document Revised: 07/17/2015 Document Reviewed: 03/15/2014 Elsevier Interactive Patient Education  2017 [employer].

## 2021-12-30 ENCOUNTER — Ambulatory Visit (INDEPENDENT_AMBULATORY_CARE_PROVIDER_SITE_OTHER): Payer: PPO | Admitting: Internal Medicine

## 2021-12-30 ENCOUNTER — Encounter: Payer: Self-pay | Admitting: Internal Medicine

## 2021-12-30 VITALS — BP 126/74 | HR 58 | Temp 97.9°F | Resp 18 | Ht 70.0 in | Wt 178.1 lb

## 2021-12-30 DIAGNOSIS — G500 Trigeminal neuralgia: Secondary | ICD-10-CM | POA: Diagnosis not present

## 2021-12-30 DIAGNOSIS — Z0001 Encounter for general adult medical examination with abnormal findings: Secondary | ICD-10-CM

## 2021-12-30 DIAGNOSIS — R739 Hyperglycemia, unspecified: Secondary | ICD-10-CM

## 2021-12-30 DIAGNOSIS — I10 Essential (primary) hypertension: Secondary | ICD-10-CM

## 2021-12-30 DIAGNOSIS — Z0000 Encounter for general adult medical examination without abnormal findings: Secondary | ICD-10-CM

## 2021-12-30 DIAGNOSIS — R079 Chest pain, unspecified: Secondary | ICD-10-CM

## 2021-12-30 DIAGNOSIS — E782 Mixed hyperlipidemia: Secondary | ICD-10-CM | POA: Diagnosis not present

## 2021-12-30 MED ORDER — NITROGLYCERIN 0.4 MG SL SUBL: 0.4000 mg | SUBLINGUAL_TABLET | SUBLINGUAL | 3 refills | Status: AC | PRN

## 2021-12-30 NOTE — Progress Notes (Unsigned)
Subjective:    Patient ID: Kevin Bass, male    DOB: 04/04/1940, 81 y.o.   MRN: [id number]  DOS:  12/30/2021 Type of visit - description: CPX  Here for CPX. When asked, admits to some chest pain: He typically walks 1 mile daily, in the last 2 weeks, after walking half mile he develops mid anterior chest tightness associated with belching. He denies difficulty breathing per se, no diaphoresis, no nausea or vomiting No heartburn. Pain dissipates promptly after he is stop his walk.  No other concerns except that his BPs in the afternoon are slightly higher in the morning, in the 145-150 range.  Review of Systems See above   Past Medical History:  Diagnosis Date   Allergy    Anxiety    Arthritis    hands   Atrial fibrillation ([place])    Colon cancer ([place])    s/p partial colectomy 1990, Cscope neg 7-09, next 2014   DISORDER, MITRAL VALVE    MV recontruction at [place] 2006----still needs ABX for SBE prophylaxis    Elevated PSA    prostate nodule, [name]   GERD (gastroesophageal reflux disease)    Heart murmur    no problems since surgery   HYPERGLYCEMIA, BORDERLINE 10/16/2008   HYPERLIPIDEMIA 06/15/2006   diet controlled   Hypertension    HYPERTROPHY PROSTATE W/O UR OBST & OTH LUTS 10/16/2008   Neuropathy    Right groin pain    Dr. Redmond Pulling   SCC (squamous cell carcinoma) 06/2017   s/p Mohs---[name]   Stroke [facility])    Wears partial dentures    lower partial    Past Surgical History:  Procedure Laterality Date   COLECTOMY  1990   HERNIA REPAIR Left 2014   [facility] rep w/mesh- March 2014   LOOP RECORDER INSERTION N/A 12/01/2016   Procedure: LOOP RECORDER INSERTION;  Surgeon: Evans Lance, MD;  Location: [facility];  Service: Cardiovascular;  Laterality: N/A;   MITRAL VALVE ANNULOPLASTY     [facility] 2006   TEE WITHOUT CARDIOVERSION N/A 10/05/2016   Procedure: TRANSESOPHAGEAL ECHOCARDIOGRAM (TEE);  Surgeon: [name], MD;  Location: [facility] ENDOSCOPY;  Service:  Cardiovascular;  Laterality: N/A;    Current Outpatient Medications  Medication Instructions   acetaminophen (TYLENOL) 1,300 mg, Oral, Every 8 hours PRN   ALPRAZolam (XANAX) 0.25 MG tablet TAKE 1 TABLET(0.25 MG) BY MOUTH TWICE DAILY AS NEEDED FOR ANXIETY   amLODipine (NORVASC) 5 mg, Oral, Daily   apixaban (ELIQUIS) 5 mg, Oral, 2 times daily   Arginine 1,000 mg, Oral, Daily   Baclofen 5 MG TABS TAKE 1 TABLET BY MOUTH THREE TIMES DAILY AS NEEDED FOR PAIN   Coenzyme Q10 100 MG TABS 1 tablet, Oral, 2 times daily   gabapentin (NEURONTIN) 300 MG capsule Take 3 capsules twice a day   Glucosamine-MSM-Hyaluronic Acd (JOINT HEALTH PO) 1 capsule, Oral, Daily   loratadine (CLARITIN) 10 mg, Oral, Daily at bedtime   losartan (COZAAR) 75 mg, Oral, Daily   metoprolol succinate (TOPROL-XL) 75 mg, Oral, Daily, Take with or immediately following a meal   Multiple Vitamins-Minerals (OCUVITE PRESERVISION PO) 1 capsule, Oral, 2 times daily   nitroGLYCERIN (NITROSTAT) 0.4 mg, Sublingual, Every 5 min PRN   NON FORMULARY 1-2 packets, Oral, See admin instructions, Lypo-Spheric vitamin C gel packets- Mix 1-2 packets of gel into juice one to two times a day and drink    NON FORMULARY 10 mg, Oral, See admin instructions, [employer] Lyco-Sorb (  for Prostate & Cardiovascular Health) 10 mg capsules: Take 10 mg by mouth two times a day   Omega-3 Fatty Acids (FISH OIL PO) 1 capsule, Oral, 2 times daily   omeprazole (PRILOSEC) 40 mg, Oral, Daily   OXcarbazepine (TRILEPTAL) 150 MG tablet 75mg  in the morning and 150 mg at night   rosuvastatin (CRESTOR) 20 mg, Oral, Daily   Vitamin D, Cholecalciferol, 25 MCG (1000 UT) TABS 1 tablet, Oral, Daily       Objective:   Physical Exam BP 126/74   Pulse (!) 58   Temp 97.9 F (36.6 C) (Oral)   Resp 18   Ht 5\' 10"  (1.778 m)   Wt 178 lb 2 oz (80.8 kg)   SpO2 99%   BMI 25.56 kg/m  General: Well developed, NAD, BMI noted Neck: No  thyromegaly  HEENT:  Normocephalic .  Face symmetric, atraumatic Lungs:  CTA B Normal respiratory effort, no intercostal retractions, no accessory muscle use. Heart: RRR,  no murmur.  Abdomen:  Not distended, soft, non-tender. No rebound or rigidity.   Lower extremities: no pretibial edema bilaterally  Skin: Exposed areas without rash. Not pale. Not jaundice Neurologic:  alert & oriented X3.  Speech normal, gait appropriate for age and unassisted Strength symmetric and appropriate for age.  Psych: Cognition and judgment appear intact.  Cooperative with normal attention span and concentration.  Behavior appropriate. No anxious or depressed appearing.     Assessment    Assessment Prediabetes HTN Hyperlipidemia, (intolerant to simva-pravachol: shoulder pain) Anxiety- rarely takes xanax Neuro  -- Trigeminal neuralgia  dx 2015, no w/u, sx resurface x1 after temporary d/c of meds  --saw neuro again 02/2017, labs (-), had a NCS, Rx gaba -- strokes CV: --Mitral valve annuloplasty 2006-- needs SBE prophylaxis -- SOB 04-2016, seen at [facility], had a ECHO Nl fx MV, cath: no CAD --Cryptogenic stroke 09/12/2016, had slurred speech. 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 [name] Colon cancer partial colectomy 1990, colonoscopy 08-2007 negative, Cscope again 09-2012 normal, 5 years  SCC . MOHS 06/2017  PLAN: Here for CPX Prediabetes: Check A1c HTN: Currently on amlodipine, losartan, metoprolol, BPs at home in the 120s except for in the afternoons they can go high care today 145 or 150. For now recommend no change. Hyperlipidemia: On Crestor, controlled. Trigeminal neuralgia: See last visit, he stopped oxcarbazepine but symptoms resurface it, he went back on that medication and symptoms are  better. Chest pain: Presents with exertional chest pain as described above, symptoms are somewhat concerning for angina.  Has a history of A-fib, anticoagulated.  History of mitral valve annuloplasty 2006. EKG today: Bradycardia, I believe he is on sinus rhythm (see lead II).  No acute changes. Saw cardiology a year ago, had chest pain, low risk stress test. Plan: We will discuss EKG with cardiology and refer back to them. ER if symptoms severe, NTG protocol in case he has persistent or severe symptoms. RTC 4 months   -Td 2017 per pt - PNM 23: 2006, 2012, 2022;  prevnar--2015   - S/p Zostavax per pt , no documentation - S/p shingrix - RSV discussed -COVID booster discussed -Had a flu shot -h/o colon ca: Last Cscope-- 09-2012 neg, declines further scopes  -Has a prostate nodule,  see last CPX,  no further eval - Recent labs reviewed, will get AST ALT, A1c -POA discussed.    HTN: Since the last visit, he has been taking amlodipine 5 mg daily, losartan 75 mg daily, metoprolol 75 mg daily   (although we agreed on take only 50 mg of each).  His BP was relatively well controlled until he started oxcarbazepine about a week ago. The elevated BP and headache led to a  ER visit, work-up negative except for a low sodium.  He self d/c  oxcarbazepine and now his BP is normal. Plan: Continue losartan 75 mg daily, metoprolol 75 mg daily, amlodipine 5 mg daily.  Continue monitoring BPs.  Recheck a BMP. Trigeminal neuralgia:  self d/c   Oxcarbazepine because he feels it increases his BP, she is not sure if the medication helped much with his symptoms, declines a rechallenge.    RTC already scheduled for the end of the month

## 2021-12-30 NOTE — Patient Instructions (Addendum)
Vaccines I recommend:  Covid booster RSV vaccine  For chest pain: Rest We are referring you to cardiology If you have chest pain: Start nitroglycerin under your tongue every 5 minutes.  If after the third dose you are not improving: Call 911. Seek medical attention immediately if symptoms severe.   Check the  blood pressure regularly BP GOAL is between 110/65 and  135/85. If it is consistently higher or lower, let me know   GO TO THE LAB : Get the blood work     Kevin Bass, Kevin Bass back for   a checkup in 4 months    Do you have a "Living will" or "[place] of attorney"? (Advance care planning documents)  If you already have a living will or healthcare power of attorney, is recommended you bring the copy to be scanned in your chart. The document will be available to all the doctors you see in the system.  If you don't have one, please consider create one.    More information at:  [email or web address]

## 2021-12-31 ENCOUNTER — Encounter: Payer: Self-pay | Admitting: Internal Medicine

## 2021-12-31 LAB — ALT: ALT: 13 U/L (ref 0–53)

## 2021-12-31 LAB — AST: AST: 16 U/L (ref 0–37)

## 2021-12-31 LAB — HEMOGLOBIN A1C: Hgb A1c MFr Bld: 5.9 % (ref 4.6–6.5)

## 2021-12-31 NOTE — Assessment & Plan Note (Signed)
Here for CPX Prediabetes: Check A1c HTN: Currently on amlodipine, losartan, metoprolol, BPs at home in the 120s ; in the afternoons BP can go high up to 145 or 150. For now recommend no change, h/o low BPs before. Hyperlipidemia: On Crestor, controlled. Trigeminal neuralgia: See last visit, he stopped oxcarbazepine but symptoms resurface it, he went back on that medication and symptoms are better. Chest pain: Presents with exertional chest pain as described above, symptoms are somewhat concerning for angina.  Has a history of A-fib, anticoagulated.  History of mitral valve annuloplasty 2006. EKG today: Bradycardia, I believe he is on sinus rhythm (see lead II).  No acute changes. Saw cardiology a year ago, had chest pain, low risk stress test. Plan: We will discuss EKG with cardiology and refer back to them. ER if symptoms severe, NTG protocol in case he has persistent or severe symptoms. RTC 4 months

## 2021-12-31 NOTE — Assessment & Plan Note (Signed)
-  Td 2017 per pt - PNM 23: 2006, 2012, 2022;  prevnar--2015   - S/p Zostavax per pt , no documentation - S/p shingrix - RSV discussed -COVID booster discussed -Had a flu shot -h/o colon ca: Last Cscope-- 09-2012 neg, declines further scopes  -Has a prostate nodule,  see last CPX, no further eval - Recent labs reviewed, will get AST ALT, A1c -POA discussed.

## 2022-01-18 ENCOUNTER — Ambulatory Visit: Payer: PPO | Admitting: Neurology

## 2022-01-18 ENCOUNTER — Encounter: Payer: Self-pay | Admitting: Neurology

## 2022-01-18 VITALS — BP 106/61 | HR 68 | Ht 70.0 in | Wt 179.0 lb

## 2022-01-18 DIAGNOSIS — G500 Trigeminal neuralgia: Secondary | ICD-10-CM | POA: Diagnosis not present

## 2022-01-18 DIAGNOSIS — G629 Polyneuropathy, unspecified: Secondary | ICD-10-CM

## 2022-01-18 MED ORDER — OXCARBAZEPINE 150 MG PO TABS: ORAL_TABLET | ORAL | 3 refills | Status: DC

## 2022-01-18 MED ORDER — BACLOFEN 5 MG PO TABS: 1.0000 | ORAL_TABLET | Freq: Three times a day (TID) | ORAL | 6 refills | Status: DC | PRN

## 2022-01-18 NOTE — Patient Instructions (Addendum)
Always good to see you.  Continue Gabapentin 300mg : take 2 caps in AM, 3 caps in PM  2. Continue Oxcarbazepine 150mg : take 1/2 tablet in AM, 1 tablet in PM  3. Continue Baclofen 5mg  three times a day  4. Try neck exercises and let Dr. [name] know as well  5. Follow-up in 6 months, call for any changes

## 2022-01-18 NOTE — Progress Notes (Signed)
NEUROLOGY FOLLOW UP OFFICE NOTE  Kevin Bass [id number] 05-Nov-1940  HISTORY OF PRESENT ILLNESS: I had the pleasure of seeing Kevin Bass in follow-up in the neurology clinic on 01/18/2022.  The patient was last seen 2 months ago for trigeminal neuralgia, neuropathy, and history of recurrent strokes. On his last visit, he was having a flare of trigeminal neuralgia pain. He was also reporting worsening of right hand and foot neuropathy with burning pain. We discussed adding on oxcarbazepine however after taking iti for a day, his BP went to 200/100 so he stopped OXC. Gabapentin increased to 975m BID. He is also on Baclofen 537mTID. He contacted our office last month to report electrical shock pain on the right eye and cheek, with vision diminished. His eye doctor gave him a new prescription and he also decided to restart the oxcarbazepine, taking 15030m/2 tab BID, instructed to increase to 1/2 tab in AM, 1 tab in PM. He reports that he is back to taking Gabapentin 600m67m AM, 900mg20mPM. Pain is controlled on his current regimen, no side effects. BP today normal. His right eye and jaw are fine, every once in a while the side of his eye is more sensitive, but no electrical shock pain. His right foot and hand have occasional burning and feel cold. He reports checking his BP this morning, it was normal but pulse was 150. He took a Xanax and relaxed, and HR went down. He denies any dizziness, diplopia, new focal symptoms, no falls. Sleep is good. He has been having some neck stiffness.  His last EMG in 2019 showed mild to moderate right ulnar neuropathy at the elbow, mild right median neuropathy at the wrist. He had decompression surgery of both in 2019 but does not feel they helped. Normal EMG of right lower extremity.    History on Initial Assessment 10/01/2016: This is a pleasant 76 yo65H man with a history of colon cancer, hyperlipidemia, previous mitral valve repair in his usual state  of health until 09/11/16 after taking a walk, he saw his brother-in-law and when he started noticing word-finding difficulties. His wife reported that his speech was not necessarily slurred but he was having trouble finding his words. When symptoms were unchanged the next day, he decided to go to the ER. He was still noted to have mild word-finding difficulties. There was no focal numbness/tingling/weakness. He denied any headaches, dizziness, diplopia, dysarthria/dysphagia. I personally reviewed MRI brain without contrast which showed an acute infarct in the left posterior insula and parietal operculum. MRA head and carotid dopplers did not show any significant stenosis. Echocardiogram showed EF 60-65%, mild LVH, prior MV repair without significant stenosis or regurgitation, normal LA size. He was switched from aspirin to Plavix. LDL was 124, HbA1c was 5.6. He was unable to get a TEE in the hospital, and has seen Dr. HiltyDebara Pickettagreed to having the procedure done on 8/14 followed by loop recorder. He denies any palpitations, chest pain, shortness of breath. No neck/back pain, bowel/bladder dysfunction. No falls. He denies any side effect on Plavix. He was started on Crestor yesterday. He feels he is 95% or more back to baseline, he denies any confusion or focal symptoms. He reports a history of facial pain on the right upper lip radiating up his cheek with good response to gabapentin. He takes 300mg 4mwith no side effects.   Update 12/07/16: He had no residual deficits from prior stroke, then on 11/29/16 he had sudden  onset vertigo that made him fall backward. This lasted for 15 seconds and was followed by a mild headache. He called triage and was instructed to go seek medical attention the next day. He went to the ER and was found to have a right PICA territory stroke. He was evaluated by Neurology with no focal deficits seen. He reports he was taking aspirin and Plavix when the stroke occurred. He had an  extensive workup for the prior stroke, he had a TEE in August 2018 which showed normal left atrium, no thrombus seen. He had a 48-hour monitor which was unremarkable. In the hospital, he had a CTA of the head and neck which did not show any occlusion or stenosis. Repeat TTE showed EF of 55-60%, left atrium mildly dilated. He had a CT chest/abdomen/pelvis which did not show any evidence of malignancy. On hospital discharge, he was instructed to continue aspirin and Plavix and stop his antihypertensives. He had a loop recorder placed.   Update 12/06/2017: He underwent right median nerve and ulnar nerve decompression on 11/28/17. He had stopped the Eliquis for 2 days for the procedure, and restarted Eliquis on 11/29/17. On 12/02/17, he took a nap and woke up with right arm weakness and numbness. He could not lift his right arm up. After 20 minutes, he started getting more feeling in his arm. Per records, he stood up and noticed his gait was off. The weakness improved after several minutes, but he continued to feel off balance. He went to the ER where SBP was elevated at 170. NIH 0 in the hospital. Records were reviewed, he had an MRI brain without contrast showing an acute infarct in the left hippocampus, occlusion of the left PCA which could be acute. There were chronic infarcts in the right cerebellum and left parietal operculum. He had an echo which showed an EF of 55-60%, left atrium normal, right atrium moderately dilated. No significant stenosis on carotid dopplers. LDL in August 2019 was 120, he was advised to increase Crestor to 40m daily. He was having muscle pains and stopped statin for 5 months, he was restarted Crestor with Zetia last August.  He was discharged home 2 days ago and reports that this is the worst of his strokes. It has affected his memory more than anything. He has also noticed vision changes on the right visual field, he describes blurred/double vision reading on his right side. He feels  his strength is back but his balance is still off. He also has left foot pain. He has been dealing with his wife and sister's recent deaths.    Diagnostic Data: Neuropathy labs were normal (TSH, B12, ESR, folate, SPEP/IFE). He had an EMG/NCV of the right UE and LE which showed right ulnar neuropathy with slowing across the elbow, axon loss and demyelinating in type, mild to moderate in degree; right median neuropathy at or distal to the wrist, consistent with carpal tunnel, mild in degree, and probable L5 radiculopathy, mild. No evidence of a large fiber sensorimotor polyneuropathy.     PAST MEDICAL HISTORY: Past Medical History:  Diagnosis Date   Allergy    Anxiety    Arthritis    hands   [place]    [place] [facility]    s/p partial colectomy 1990, Cscope neg 7-09, next 2014   DISORDER, MITRAL VALVE    [place] [facility]still needs ABX for SBE prophylaxis    Elevated PSA    prostate nodule, Dr BAlinda Money  GERD (gastroesophageal reflux disease)    Heart murmur    no problems since surgery   HYPERGLYCEMIA, BORDERLINE 10/16/2008   HYPERLIPIDEMIA 06/15/2006   diet controlled   Hypertension    HYPERTROPHY PROSTATE W/O UR OBST & OTH LUTS 10/16/2008   Neuropathy    Right groin pain    Dr. Redmond Pulling   SCC (squamous cell carcinoma) 06/2017   s/p Mohs---[name]   Stroke [facility])    Wears partial dentures    lower partial    MEDICATIONS: Current Outpatient Medications on File Prior to Visit  Medication Sig Dispense Refill   acetaminophen (TYLENOL) 650 MG CR tablet Take 1,300 mg by mouth every 8 (eight) hours as needed for pain.     ALPRAZolam (XANAX) 0.25 MG tablet TAKE 1 TABLET(0.25 MG) BY MOUTH TWICE DAILY AS NEEDED FOR ANXIETY 60 tablet 1   amLODipine (NORVASC) 5 MG tablet Take 5 mg by mouth daily.     apixaban (ELIQUIS) 5 MG TABS tablet Take 1 tablet (5 mg total) by mouth 2 (two) times daily. 180 tablet 1   Arginine 500 MG CAPS Take 1,000 mg by mouth  daily.      Baclofen 5 MG TABS TAKE 1 TABLET BY MOUTH THREE TIMES DAILY AS NEEDED FOR PAIN 90 tablet 0   Coenzyme Q10 100 MG TABS Take 1 tablet by mouth in the morning and at bedtime.     gabapentin (NEURONTIN) 300 MG capsule Take 3 capsules twice a day 540 capsule 3   Glucosamine-MSM-Hyaluronic Acd (JOINT HEALTH PO) Take 1 capsule by mouth daily.      loratadine (CLARITIN) 10 MG tablet Take 10 mg by mouth at bedtime.     losartan (COZAAR) 25 MG tablet Take 3 tablets (75 mg total) by mouth daily.     metoprolol succinate (TOPROL-XL) 25 MG 24 hr tablet Take 3 tablets (75 mg total) by mouth daily. Take with or immediately following a meal 270 tablet 1   Multiple Vitamins-Minerals (OCUVITE PRESERVISION PO) Take 1 capsule by mouth 2 (two) times daily.      nitroGLYCERIN (NITROSTAT) 0.4 MG SL tablet Place 1 tablet (0.4 mg total) under the tongue every 5 (five) minutes x 3 doses as needed for chest pain. 25 tablet 3   NON FORMULARY Take 1-2 packets by mouth See admin instructions. Lypo-Spheric vitamin C gel packets- Mix 1-2 packets of gel into juice one to two times a day and drink     NON FORMULARY Take 10 mg by mouth See admin instructions. [employer] Lyco-Sorb (for Prostate & Cardiovascular Health) 10 mg capsules: Take 10 mg by mouth two times a day     Omega-3 Fatty Acids (FISH OIL PO) Take 1 capsule by mouth in the morning and at bedtime.     omeprazole (PRILOSEC) 40 MG capsule Take 1 capsule (40 mg total) by mouth daily. (Patient taking differently: Take 20 mg by mouth daily.) 90 capsule 1   OXcarbazepine (TRILEPTAL) 150 MG tablet 48m in the morning and 150 mg at night 60 tablet 0   rosuvastatin (CRESTOR) 20 MG tablet Take 1 tablet (20 mg total) by mouth daily. 90 tablet 3   Vitamin D, Cholecalciferol, 25 MCG (1000 UT) TABS Take 1 tablet by mouth daily.     No current facility-administered medications on file prior to visit.    ALLERGIES: Allergies  Allergen Reactions   Prednisolone  Acetate     Increased intraocular pressure   Statins Other (See Comments)  Myalgias (intolerance) Simvastatin, atorvastatin, pravastatin    FAMILY HISTORY: Family History  Problem Relation Age of Onset   Colon cancer Other        M side (cousins)   Diabetes Neg Hx    Stroke Neg Hx    CAD Neg Hx    Prostate cancer Neg Hx    Rectal cancer Neg Hx    Esophageal cancer Neg Hx     SOCIAL HISTORY: Social History   Socioeconomic History   Marital status: Widowed    Spouse name: suzanne   Number of children: 2   Years of education: college   Highest education level: Not on file  Occupational History   Occupation: retired   Tobacco Use   Smoking status: Former    Packs/day: 0.50    Years: 20.00    Total pack years: 10.00    Types: Cigarettes    Quit date: 02/23/1983    Years since quitting: 38.9   Smokeless tobacco: Never  Vaping Use   Vaping Use: Never used  Substance and Sexual Activity   Alcohol use: Yes    Alcohol/week: 7.0 standard drinks of alcohol    Types: 7 Glasses of wine per week   Drug use: No   Sexual activity: Not Currently  Other Topics Concern   Not on file  Social History Narrative   From [place], lost wife 08/2017 to cancer   Lives by himself    Daughter in [place], son in [place]   Right handed      Social Determinants of Health   Financial Resource Strain: Medium Risk (10/06/2021)   Overall Financial Resource Strain (CARDIA)    Difficulty of Paying Living Expenses: Somewhat hard  Food Insecurity: No Food Insecurity (12/15/2021)   Hunger Vital Sign    Worried About Running Out of Food in the Last Year: Never true    Ran Out of Food in the Last Year: Never true  Transportation Needs: No Transportation Needs (12/15/2021)   PRAPARE - Hydrologist (Medical): No    Lack of Transportation (Non-Medical): No  Physical Activity: Insufficiently Active (10/06/2021)   Exercise Vital Sign    Days of Exercise per Week: 2 days     Minutes of Exercise per Session: 30 min  Stress: No Stress Concern Present (12/15/2021)   [place]    Feeling of Stress : Not at all  Social Connections: Moderately Isolated (12/15/2021)   Social Connection and Isolation Panel [NHANES]    Frequency of Communication with Friends and Family: More than three times a week    Frequency of Social Gatherings with Friends and Family: Three times a week    Attends Religious Services: 1 to 4 times per year    Active Member of Clubs or Organizations: No    Attends Archivist Meetings: Never    Marital Status: Widowed  Intimate Partner Violence: Not At Risk (12/15/2021)   Humiliation, Afraid, Rape, and Kick questionnaire    Fear of Current or Ex-Partner: No    Emotionally Abused: No    Physically Abused: No    Sexually Abused: No     PHYSICAL EXAM: Vitals:   01/18/22 1043  BP: 106/61  Pulse: 68  SpO2: 99%   General: No acute distress Head:  Normocephalic/atraumatic Skin/Extremities: No rash, no edema Neurological Exam: alert and awake. No aphasia or dysarthria. Fund of knowledge is appropriate.  Attention and concentration are normal.  Cranial nerves: Pupils equal, round. Extraocular movements intact with no nystagmus. Visual fields full.  No facial asymmetry.  Motor: Bulk and tone normal, muscle strength 5/5 throughout with no pronator drift.   Finger to nose testing intact.  Gait narrow-based and steady, no ataxia.  Romberg negative.   IMPRESSION: This is a pleasant 81 yo RH man with a history of colon cancer, hyperlipidemia, previous mitral valve repair with recurrent strokes (x3) secondary to atrial fibrillation, neuropathy, trigeminal neuralgia. Trigeminal neuralgia improved with addition of oxcarbazepine 151m: 1/2 tab in AM, 1 tab in PM. He is also on Gabapentin 3046m 2 caps in AM, 3 caps in PM and Baclofen 37m72mID without side effects. Neuropathy stable, he  continues to note burning and cold sensation in the right arm and leg, we discussed EMG/NCV, he would like to hold off for now. Continue Eliquis and control of vascular risk factors for secondary stroke prevention. Neck exercises given for neck stiffness, if no improvement, follow-up with PCP. Follow-up in 6 months, call for any changes.   Thank you for allowing me to participate in his care.  Please do not hesitate to call for any questions or concerns.    KarEllouise Newer.D.   CC: Dr. PazLarose Kells

## 2022-01-21 DIAGNOSIS — I739 Peripheral vascular disease, unspecified: Secondary | ICD-10-CM | POA: Diagnosis not present

## 2022-01-21 DIAGNOSIS — G500 Trigeminal neuralgia: Secondary | ICD-10-CM | POA: Diagnosis not present

## 2022-01-22 ENCOUNTER — Encounter (HOSPITAL_BASED_OUTPATIENT_CLINIC_OR_DEPARTMENT_OTHER): Payer: Self-pay | Admitting: Internal Medicine

## 2022-01-22 ENCOUNTER — Ambulatory Visit (HOSPITAL_BASED_OUTPATIENT_CLINIC_OR_DEPARTMENT_OTHER): Payer: PPO | Admitting: Internal Medicine

## 2022-01-22 VITALS — BP 118/56 | HR 60 | Ht 70.0 in | Wt 178.0 lb

## 2022-01-22 DIAGNOSIS — Z9889 Other specified postprocedural states: Secondary | ICD-10-CM | POA: Diagnosis not present

## 2022-01-22 DIAGNOSIS — I480 Paroxysmal atrial fibrillation: Secondary | ICD-10-CM | POA: Diagnosis not present

## 2022-01-22 DIAGNOSIS — I10 Essential (primary) hypertension: Secondary | ICD-10-CM

## 2022-01-22 DIAGNOSIS — Z7901 Long term (current) use of anticoagulants: Secondary | ICD-10-CM

## 2022-01-22 NOTE — Progress Notes (Signed)
OFFICE CONSULT NOTE  Chief Complaint:  No complaints  Primary Care Physician: Colon Branch, MD  HPI:  Kevin Bass is a 81 y.o. male who is being seen today for the evaluation of workup of cryptogenic stroke at the request of Colon Branch, MD. Kevin Bass is a pleasant 81 year old patient who recently had an unfortunate cryptogenic stroke. His past ocular history significant for mitral valve disease status post repair at [facility]. He is followed by cardiologist there and recently was admitted to [facility] with cryptogenic stroke. As per routine care or transesophageal echocardiogram as well as a implanted loop recorder were recommended. Unfortunately there was difficulty during the transesophageal echocardiogram procedure which involved technical difficulties with equipment and it was not able to be completed. There was some concern that the probe could not be passed adequately and he underwent upper esophageal imaging which was normal. He has had prior teas without any difficulty. After that he decided against a loop recorder and wanted to get another opinion from his cardiologist at [facility]. There was a visit there which did not indicate that a transesophageal echocardiogram would be helpful. I do not see any evidence on his prior echocardiograms of a bubble study to exclude PFO. This could be a significant finding as there is evidence now that closure of a small PFO in the setting of a patient whose had a cryptogenic stroke is guideline indicated. It's also possible that he could have atrial fibrillation, especially given his history of mitral valve disease and valve repair which is a significant risk factor for arrhythmias.  12/21/2016  Kevin Bass was seen today in follow-up.  He underwent a repeat echocardiogram which did not demonstrate any LV thrombus or evidence for atrial septal defect.  Subsequently he had placement of an implanted loop recorder by Dr. Lovena Le which was seen in  follow-up and appears to be well-healed.  There is no evidence today of any significant arrhythmias such as atrial fibrillation.  He is currently on aspirin and Plavix for stroke prevention.  He is also had increasing doses of medication to control hypertension.  Currently his blood pressure is well controlled at 108/70.  He is appropriately on rosuvastatin, ezetimibe and fish oil with an LDL-C is 60 approximately 3 weeks ago.  06/17/2017  Kevin Bass returns today for follow-up.  Overall he says he feels fairly well.  He denies any significant palpitations or recurrent atrial fibrillation.  EKG today shows normal sinus rhythm at 62.  He has been working with Dr. [name] for neuropathy.  Is currently on very high dose of gabapentin (a total of 1500 mg daily).  He is tolerating apixaban without any bleeding difficulty and cholesterol has been well controlled on rosuvastatin and ezetimibe.  Only, today he is complaining of some lower extremity discomfort and minimal edema.  He says this is worse on long car rides.  He does have notable bilateral lower extremity varicosities, and would likely benefit from compression stockings.  07/11/2017  Kevin Bass was seen today in follow-up.  I last saw him about a month ago.  He was doing well without any symptomatic A. fib.  His A. fib was detected by implanted loop recorder.  He has been on anticoagulation.  Most recently he was in the emergency department for symptomatic atrial fibrillation with heart rate as high as 200 when he was seen by EMS.  He was treated and seen in the emergency department.  He was started on metoprolol and  converted back to sinus rhythm.  He had questions about the value of the implanted loop recorder, however I reminded him that his job was not to notify you of arrhythmias as they were happening.    03/07/2018  Kevin Bass returns today for follow-up.  Recently he has been reported labile blood pressure.  He notes that he is having spikes  in his blood pressure mostly in the afternoons at times up to 170 although has some lower blood pressures in the morning.  I personally reviewed his blood pressure cuff (which incidentally is close to 83 years old) and straining some lability in his blood pressures.  Currently he is taking 75 mg of metoprolol in the morning along with 50 mg of losartan and was also prescribed to take an additional hydrochlorothiazide.  He said the hydrochlorothiazide was making him dizzy and therefore he has stopped the medication.  Of note he did have a recurrent TIA affecting the right PICA territory.  There was some associated transient vertigo.  This apparently happened after holding his Eliquis for 2 days due to a scheduled median and ulnar nerve decompression.  It was felt that A. fib was the cause of this however it seems fairly unlikely that he is developed thrombus during that period of time to cause stroke.  I do believe there is likely another explanation for this cause.  I do not want him to feel that he could never come off of blood thinners because of the risk for stroke.  09/21/2018  Kevin Bass was seen today in follow-up.  Overall he seems to be doing better.  Fortunately had no further strokes after that episode where he discontinued his Eliquis for 4 doses.  He has decreased his Toprol-XL down to 25 mg from 75 mg daily.  Blood pressure is well controlled and heart rate is in the low 60s.  He continues to have remote checks without any significant findings.  The plan is to allow the device to run out of battery life and then he may consider either explantation or just leaving it.  He is on Eliquis without any bleeding complications.  He last had an echo in October 2019 which showed stability of his mitral valve repair.  05/29/2019  Kevin Bass returns today for follow-up.  Overall he says he is feeling well.  Denies any chest pain or worsening shortness of breath.  Recent lipids are excellent in fact has had  a marked reduction in his LDL cholesterol.  He says it previously was because he had stopped taking his statin and was only on the Zetia due to concern for side effects however he felt like he was not having significant side effects and therefore went back on the medicine.  More recently total cholesterol is now 91, triglycerides 127, HDL 40 and LDL of 26 (reduced from 120).  Blood pressure is well controlled today.  He is blood pressure though is labile and by history his PCP has adjusted his medications.  Specifically his losartan has been decreased to 75 mg daily in the morning and metoprolol XL has decreased to 75 mg as well.  He denies any further TIA events.  He denies any bleeding difficulty on Eliquis.  01/22/2022  Kevin Bass is seen today in follow-up.  Overall he says he is doing pretty well.  He was seen by my partner last year for some chest pain.  She recommended a stress test but he was hesitant to proceed.  He spoke  with me on the phone and I recommended we go ahead and do that.  Fortunately came back negative.  This year he was in the emergency department in October with elevated blood pressure.  He says occasionally gets spikes with this.  Since then however it has been better regulated.  He denies any chest pain or shortness of breath.  PMHx:  Past Medical History:  Diagnosis Date   Allergy    Anxiety    Arthritis    hands   Atrial fibrillation ([place])    Colon cancer ([place])    s/p partial colectomy 1990, Cscope neg 7-09, next 2014   DISORDER, MITRAL VALVE    MV recontruction at [place] 2006----still needs ABX for SBE prophylaxis    Elevated PSA    prostate nodule, [name]   GERD (gastroesophageal reflux disease)    Heart murmur    no problems since surgery   HYPERGLYCEMIA, BORDERLINE 10/16/2008   HYPERLIPIDEMIA 06/15/2006   diet controlled   Hypertension    HYPERTROPHY PROSTATE W/O UR OBST & OTH LUTS 10/16/2008   Neuropathy    Right groin pain    Dr. Redmond Pulling   SCC (squamous  cell carcinoma) 06/2017   s/p Mohs---[name]   Stroke [facility])    Wears partial dentures    lower partial    Past Surgical History:  Procedure Laterality Date   COLECTOMY  1990   HERNIA REPAIR Left 2014   [facility] rep w/mesh- March 2014   LOOP RECORDER INSERTION N/A 12/01/2016   Procedure: LOOP RECORDER INSERTION;  Surgeon: Evans Lance, MD;  Location: [place] [facility];  Service: Cardiovascular;  Laterality: N/A;   MITRAL VALVE ANNULOPLASTY     [facility] 2006   TEE WITHOUT CARDIOVERSION N/A 10/05/2016   Procedure: TRANSESOPHAGEAL ECHOCARDIOGRAM (TEE);  Surgeon: [name], MD;  Location: [facility] ENDOSCOPY;  Service: Cardiovascular;  Laterality: N/A;    FAMHx:  Family History  Problem Relation Age of Onset   Colon cancer Other        M side (cousins)   Diabetes Neg Hx    Stroke Neg Hx    CAD Neg Hx    Prostate cancer Neg Hx    Rectal cancer Neg Hx    Esophageal cancer Neg Hx     SOCHx:   reports that he quit smoking about 38 years ago. His smoking use included cigarettes. He has a 10.00 pack-year smoking history. He has never used smokeless tobacco. He reports current alcohol use of about 7.0 standard drinks of alcohol per week. He reports that he does not use drugs.  ALLERGIES:  Allergies  Allergen Reactions   Prednisolone Acetate     Increased intraocular pressure   Statins Other (See Comments)    Myalgias (intolerance) Simvastatin, atorvastatin, pravastatin    ROS: Pertinent items noted in HPI and remainder of comprehensive ROS otherwise negative.  HOME MEDS: Current Outpatient Medications on File Prior to Visit  Medication Sig Dispense Refill   acetaminophen (TYLENOL) 650 MG CR tablet Take 1,300 mg by mouth every 8 (eight) hours as needed for pain.     ALPRAZolam (XANAX) 0.25 MG tablet TAKE 1 TABLET(0.25 MG) BY MOUTH TWICE DAILY AS NEEDED FOR ANXIETY 60 tablet 1   amLODipine (NORVASC) 5 MG tablet Take 5 mg by mouth daily.     apixaban (ELIQUIS) 5 MG TABS tablet  Take 1 tablet (5 mg total) by mouth 2 (two) times daily. 180 tablet 1   Arginine 500 MG  CAPS Take 1,000 mg by mouth daily.      Baclofen 5 MG TABS Take 1 tablet by mouth 3 (three) times daily as needed. for pain 90 tablet 6   Coenzyme Q10 100 MG TABS Take 1 tablet by mouth in the morning and at bedtime.     gabapentin (NEURONTIN) 300 MG capsule Take 3 capsules twice a day (Patient taking differently: Take 300 mg by mouth. Take 2 in am and 3 at night) 540 capsule 3   Glucosamine-MSM-Hyaluronic Acd (JOINT HEALTH PO) Take 1 capsule by mouth daily.      loratadine (CLARITIN) 10 MG tablet Take 10 mg by mouth at bedtime.     losartan (COZAAR) 25 MG tablet Take 3 tablets (75 mg total) by mouth daily.     metoprolol succinate (TOPROL-XL) 25 MG 24 hr tablet Take 3 tablets (75 mg total) by mouth daily. Take with or immediately following a meal 270 tablet 1   Multiple Vitamins-Minerals (OCUVITE PRESERVISION PO) Take 1 capsule by mouth 2 (two) times daily.      nitroGLYCERIN (NITROSTAT) 0.4 MG SL tablet Place 1 tablet (0.4 mg total) under the tongue every 5 (five) minutes x 3 doses as needed for chest pain. 25 tablet 3   NON FORMULARY Take 1-2 packets by mouth See admin instructions. Lypo-Spheric vitamin C gel packets- Mix 1-2 packets of gel into juice one to two times a day and drink     NON FORMULARY Take 10 mg by mouth See admin instructions. Jarrow Formulas Lyco-Sorb (for Prostate & Cardiovascular Health) 10 mg capsules: Take 10 mg by mouth two times a day     Omega-3 Fatty Acids (FISH OIL PO) Take 1 capsule by mouth in the morning and at bedtime.     omeprazole (PRILOSEC) 20 MG capsule Take 20 mg by mouth daily.     OXcarbazepine (TRILEPTAL) 150 MG tablet Take 1/2 tablet in AM, 1 tablet in PM 135 tablet 3   rosuvastatin (CRESTOR) 20 MG tablet Take 1 tablet (20 mg total) by mouth daily. 90 tablet 3   Vitamin D, Cholecalciferol, 25 MCG (1000 UT) TABS Take 1 tablet by mouth daily.     No current  facility-administered medications on file prior to visit.    LABS/IMAGING: No results found for this or any previous visit (from the past 48 hour(s)). No results found.  LIPID PANEL:    Component Value Date/Time   CHOL 111 08/24/2021 1352   TRIG 111 08/24/2021 1352   HDL 41 08/24/2021 1352   CHOLHDL 2.7 08/24/2021 1352   VLDL 25.4 12/11/2018 1101   LDLCALC 51 08/24/2021 1352   LDLDIRECT 149.1 08/30/2012 1352    WEIGHTS: Wt Readings from Last 3 Encounters:  01/22/22 178 lb (80.7 kg)  01/18/22 179 lb (81.2 kg)  12/30/21 178 lb 2 oz (80.8 kg)    VITALS: BP (!) 118/56 (BP Location: Right Arm, Patient Position: Sitting, Cuff Size: Normal)   Pulse 60   Ht 5\' 10"  (1.778 m)   Wt 178 lb (80.7 kg)   BMI 25.54 kg/m   EXAM: General appearance: alert and no distress Neck: no carotid bruit, no JVD and thyroid not enlarged, symmetric, no tenderness/mass/nodules Lungs: clear to auscultation bilaterally Heart: regular rate and rhythm, S1, S2 normal, no murmur, click, rub or gallop Abdomen: soft, non-tender; bowel sounds normal; no masses,  no organomegaly Extremities: extremities normal, atraumatic, no cyanosis or edema and varicose veins noted Pulses: 2+ and symmetric Skin: Skin color, texture,  turgor normal. No rashes or lesions Neurologic: Grossly normal Psych: Pleasant  EKG: Normal sinus rhythm at 60-personally reviewed  ASSESSMENT: Labile blood pressure Recurrent stroke Status post mitral valve repair A-fib - S/p ILR CHADSVASC Score of 4 - on Eliquis Essential hypertension Dyslipidemia  PLAN: 1.   Kevin Bass seems to be doing well without any worsening chest pain or shortness of breath.  He notes that he has some intermittent episodes of tachycardia which I suspect may be A-fib.  He is anticoagulated and does not seem to be symptomatic with this, just noted an elevated heart rate on his blood pressure cuff.  He does get some labile blood pressures and was in the ER  for this recently.  Overall, though he seems to be feeling well.  He question about his mitral valve repair.  I do not hear any significant murmur on exam, but we have not reassessed the valve repair for stability since 2019.  Will repeat an echo next year for surveillance.  Plan follow-up with me afterwards.  [name], MD, [facility] [place] Director of the Advanced Lipid Disorders &  Cardiovascular Risk Reduction Clinic Diplomate of the American Board of Clinical Lipidology Attending Cardiologist  Direct Dial: [phone]  Fax: [phone]  Website:  [email or web address] 01/22/2022, 2:19 PM

## 2022-01-22 NOTE — Patient Instructions (Signed)
Medication Instructions:  NO CHANGES  *If you need a refill on your cardiac medications before your next appointment, please call your pharmacy*  Testing/Procedures: Your physician has requested that you have an echocardiogram. Echocardiography is a painless test that uses sound waves to create images of your heart. It provides your doctor with information about the size and shape of your heart and how well your heart's chambers and valves are working. This procedure takes approximately one hour. There are no restrictions for this procedure. Please do NOT wear cologne, perfume, aftershave, or lotions (deodorant is allowed). Please arrive 15 minutes prior to your appointment time. This test is due early December 2024   Follow-Up: At [facility], you and your health needs are our priority.  As part of our continuing mission to provide you with exceptional heart care, we have created designated Provider Care Teams.  These Care Teams include your primary Cardiologist (physician) and Advanced Practice Providers (APPs -  Physician Assistants and Nurse Practitioners) who all work together to provide you with the care you need, when you need it.  We recommend signing up for the patient portal called "MyChart".  Sign up information is provided on this After Visit Summary.  MyChart is used to connect with patients for Virtual Visits (Telemedicine).  Patients are able to view lab/test results, encounter notes, upcoming appointments, etc.  Non-urgent messages can be sent to your provider as well.   To learn more about what you can do with MyChart, go to [email or web address].    Your next appointment:   12 month(s)  The format for your next appointment:   In Person  Provider:   Lyman Bishop MD

## 2022-02-02 ENCOUNTER — Encounter: Payer: Self-pay | Admitting: Internal Medicine

## 2022-02-03 ENCOUNTER — Other Ambulatory Visit: Payer: Self-pay | Admitting: Internal Medicine

## 2022-02-10 ENCOUNTER — Other Ambulatory Visit: Payer: Self-pay | Admitting: Internal Medicine

## 2022-03-02 ENCOUNTER — Other Ambulatory Visit: Payer: Self-pay | Admitting: Internal Medicine

## 2022-03-16 ENCOUNTER — Telehealth: Payer: Self-pay | Admitting: Internal Medicine

## 2022-03-16 MED ORDER — AMLODIPINE BESYLATE 5 MG PO TABS: 5.0000 mg | ORAL_TABLET | Freq: Two times a day (BID) | ORAL | 1 refills | Status: DC

## 2022-03-16 NOTE — Telephone Encounter (Signed)
Chart reviewed. Rx updated and sent.

## 2022-03-16 NOTE — Telephone Encounter (Signed)
Pt stated rx was supposed to be changed to 1 in the am and 1 in the pm. Please advise.   amLODipine (NORVASC) 5 MG tablet   [id number] [place], [place] - [address] AT [facility] [address] [address] Phone: [phone]  Fax: [phone]

## 2022-03-17 MED ORDER — LOSARTAN POTASSIUM 25 MG PO TABS: 75.0000 mg | ORAL_TABLET | Freq: Every day | ORAL | 1 refills | Status: DC

## 2022-03-17 MED ORDER — AMLODIPINE BESYLATE 5 MG PO TABS: 5.0000 mg | ORAL_TABLET | Freq: Two times a day (BID) | ORAL | 1 refills | Status: DC

## 2022-03-17 NOTE — Telephone Encounter (Signed)
Pt states the pharmacy is not loading the correct dosage for amlodipine and losartan and he would like the nurse to call the pharmacy to figure out the issue.

## 2022-03-17 NOTE — Telephone Encounter (Signed)
Rx's resent.  

## 2022-03-17 NOTE — Addendum Note (Signed)
Addended byDamita Dunnings D on: 03/17/2022 11:23 AM   Modules accepted: Orders

## 2022-03-19 ENCOUNTER — Ambulatory Visit (INDEPENDENT_AMBULATORY_CARE_PROVIDER_SITE_OTHER): Payer: PPO | Admitting: Internal Medicine

## 2022-03-19 ENCOUNTER — Other Ambulatory Visit: Payer: Self-pay | Admitting: Internal Medicine

## 2022-03-19 ENCOUNTER — Encounter: Payer: Self-pay | Admitting: Internal Medicine

## 2022-03-19 VITALS — BP 138/80 | HR 68 | Temp 98.1°F | Resp 18 | Ht 70.0 in | Wt 180.1 lb

## 2022-03-19 DIAGNOSIS — G500 Trigeminal neuralgia: Secondary | ICD-10-CM | POA: Diagnosis not present

## 2022-03-19 DIAGNOSIS — R079 Chest pain, unspecified: Secondary | ICD-10-CM | POA: Diagnosis not present

## 2022-03-19 DIAGNOSIS — I480 Paroxysmal atrial fibrillation: Secondary | ICD-10-CM | POA: Diagnosis not present

## 2022-03-19 NOTE — Progress Notes (Signed)
Subjective:    Patient ID: Kevin Bass, male    DOB: [birth year], 82 y.o.   MRN: [id number]  DOS:  03/19/2022 Type of visit - description: Acute visit  Patient is concerned about pain at the left of the chest for 2 to 3 weeks?Marland Kitchen Happens on and off, randomly, not sxs in the last few days. Described as a electric shock, sharp and short-lived. Denies any fall or injury No rash No pain when bending his torso No substernal chest pain.  Saw cardiology, note reviewed Saw neurology, note reviewed  BP Readings from Last 3 Encounters:  03/19/22 138/80  01/22/22 (!) 118/56  01/18/22 106/61   Review of Systems See above   Past Medical History:  Diagnosis Date   Allergy    Anxiety    Arthritis    hands   Atrial fibrillation ([place])    Colon cancer ([place])    s/p partial colectomy 1990, Cscope neg 7-09, next 2014   DISORDER, MITRAL VALVE    MV recontruction at [place] 2006----still needs ABX for SBE prophylaxis    Elevated PSA    prostate nodule, [name]   GERD (gastroesophageal reflux disease)    Heart murmur    no problems since surgery   HYPERGLYCEMIA, BORDERLINE 10/16/2008   HYPERLIPIDEMIA 06/15/2006   diet controlled   Hypertension    HYPERTROPHY PROSTATE W/O UR OBST & OTH LUTS 10/16/2008   Neuropathy    Right groin pain    Dr. Redmond Pulling   SCC (squamous cell carcinoma) 06/2017   s/p Mohs---[name]   Stroke [facility])    Wears partial dentures    lower partial    Past Surgical History:  Procedure Laterality Date   COLECTOMY  1990   HERNIA REPAIR Left 2014   [facility] rep w/mesh- March 2014   LOOP RECORDER INSERTION N/A 12/01/2016   Procedure: LOOP RECORDER INSERTION;  Surgeon: Evans Lance, MD;  Location: [place] CV LAB;  Service: Cardiovascular;  Laterality: N/A;   MITRAL VALVE ANNULOPLASTY     [facility] 2006   TEE WITHOUT CARDIOVERSION N/A 10/05/2016   Procedure: TRANSESOPHAGEAL ECHOCARDIOGRAM (TEE);  Surgeon: [name], MD;  Location: [facility] ENDOSCOPY;  Service:  Cardiovascular;  Laterality: N/A;    Current Outpatient Medications  Medication Instructions   acetaminophen (TYLENOL) 1,300 mg, Oral, Every 8 hours PRN   ALPRAZolam (XANAX) 0.25 MG tablet TAKE 1 TABLET(0.25 MG) BY MOUTH TWICE DAILY AS NEEDED FOR ANXIETY   amLODipine (NORVASC) 5 mg, Oral, 2 times daily   apixaban (ELIQUIS) 5 mg, Oral, 2 times daily   Arginine 1,000 mg, Oral, Daily   Baclofen 5 MG TABS 1 tablet, Oral, 3 times daily PRN, for pain   Coenzyme Q10 100 MG TABS 1 tablet, Oral, 2 times daily   gabapentin (NEURONTIN) 300 MG capsule Take 3 capsules twice a day   Glucosamine-MSM-Hyaluronic Acd (JOINT HEALTH PO) 1 capsule, Oral, Daily   loratadine (CLARITIN) 10 mg, Oral, Daily at bedtime   losartan (COZAAR) 75 mg, Oral, Daily   metoprolol succinate (TOPROL-XL) 75 mg, Oral, Daily, Take with or immediately following a meal   Multiple Vitamins-Minerals (OCUVITE PRESERVISION PO) 1 capsule, Oral, 2 times daily   nitroGLYCERIN (NITROSTAT) 0.4 mg, Sublingual, Every 5 min x3 PRN   NON FORMULARY 1-2 packets, Oral, See admin instructions, Lypo-Spheric vitamin C gel packets- Mix 1-2 packets of gel into juice one to two times a day and drink    NON FORMULARY 10 mg, Oral, See admin instructions,  Jarrow Formulas Lyco-Sorb (for Prostate & Cardiovascular Health) 10 mg capsules: Take 10 mg by mouth two times a day   Omega-3 Fatty Acids (FISH OIL PO) 1 capsule, Oral, 2 times daily   omeprazole (PRILOSEC) 20 mg, Oral, Daily   OXcarbazepine (TRILEPTAL) 150 MG tablet Take 1/2 tablet in AM, 1 tablet in PM   rosuvastatin (CRESTOR) 20 mg, Oral, Daily   Vitamin D, Cholecalciferol, 25 MCG (1000 UT) TABS 1 tablet, Oral, Daily       Objective:   Physical Exam BP 138/80   Pulse 68   Temp 98.1 F (36.7 C) (Oral)   Resp 18   Ht 5\' 10"  (1.778 m)   Wt 180 lb 2 oz (81.7 kg)   SpO2 96%   BMI 25.85 kg/m  General:   Well developed, NAD, BMI noted. HEENT:  Normocephalic . Face symmetric, atraumatic MSK:  Thoracic spine no TTP Chest wall: No TTP Lungs:  CTA B Normal respiratory effort, no intercostal retractions, no accessory muscle use. Heart: RRR,  no murmur.  Lower extremities: no pretibial edema bilaterally  Skin: No rash noted at the thorax Neurologic:  alert & oriented X3.  Speech normal, gait appropriate for age and unassisted Psych--  Cognition and judgment appear intact.  Cooperative with normal attention span and concentration.  Behavior appropriate. No anxious or depressed appearing.      Assessment      Assessment Prediabetes HTN Hyperlipidemia, (intolerant to simva-pravachol: shoulder pain) Anxiety- rarely takes xanax Neuro  -- Trigeminal neuralgia  dx 2015, no w/u, sx resurface x1 after temporary d/c of meds  --saw neuro again 02/2017, labs (-), had a NCS, Rx gaba -- strokes CV: --Mitral valve annuloplasty 2006-- needs SBE prophylaxis -- SOB 04-2016, seen at [facility] had a ECHO Nl fx MV, cath: no CAD --Cryptogenic stroke 09/12/2016, had slurred speech. 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 [name] Colon cancer partial colectomy 1990, colonoscopy 08-2007 negative, Cscope again 09-2012 normal, 5 years  SCC . MOHS 06/2017  PLAN: Chest pain: As described above, unlikely to represent heart trouble, CHF or a malignancy.  For now we agreed on observation, he will let me know if pain increases in intensity or frequency. A-fib Cardiovascular: saw cards   01/22/2022: He had few episodes of tachycardia felt to be from A-fib. They noticed his  BP to be labile. They recommended an echo. Trigeminal neuralgia: Saw neurology 01/18/2022, Rx to continue gabapentin, baclofen and oxcarbazepine    RTC: Already scheduled for March

## 2022-03-19 NOTE — Patient Instructions (Addendum)
Vaccines I recommend:  Covid booster RSV vaccine  Please bring [place] a copy of your Healthcare Power of Attorney for you chart.

## 2022-03-19 NOTE — Assessment & Plan Note (Signed)
Chest pain: As described above, unlikely to represent heart trouble, CHF or a malignancy.  For now we agreed on observation, he will let me know if pain increases in intensity or frequency. A-fib Cardiovascular: saw cards   01/22/2022: He had few episodes of tachycardia felt to be from A-fib. They noticed his  BP to be labile. They recommended an echo. Trigeminal neuralgia: Saw neurology 01/18/2022, Rx to continue gabapentin, baclofen and oxcarbazepine    RTC: Already scheduled for March

## 2022-03-30 ENCOUNTER — Encounter: Payer: Self-pay | Admitting: Internal Medicine

## 2022-04-01 ENCOUNTER — Ambulatory Visit: Payer: PPO | Admitting: Family Medicine

## 2022-04-02 ENCOUNTER — Ambulatory Visit (INDEPENDENT_AMBULATORY_CARE_PROVIDER_SITE_OTHER): Payer: PPO | Admitting: Internal Medicine

## 2022-04-02 ENCOUNTER — Encounter: Payer: Self-pay | Admitting: Internal Medicine

## 2022-04-02 VITALS — BP 134/74 | HR 61 | Temp 97.8°F | Resp 18 | Ht 70.0 in | Wt 180.5 lb

## 2022-04-02 DIAGNOSIS — I10 Essential (primary) hypertension: Secondary | ICD-10-CM | POA: Diagnosis not present

## 2022-04-02 DIAGNOSIS — F32A Depression, unspecified: Secondary | ICD-10-CM

## 2022-04-02 DIAGNOSIS — F419 Anxiety disorder, unspecified: Secondary | ICD-10-CM

## 2022-04-02 DIAGNOSIS — Z79899 Other long term (current) drug therapy: Secondary | ICD-10-CM

## 2022-04-02 DIAGNOSIS — R600 Localized edema: Secondary | ICD-10-CM | POA: Diagnosis not present

## 2022-04-02 NOTE — Progress Notes (Unsigned)
Subjective:    Patient ID: Kevin Bass, male    DOB: 1940/04/07, 82 y.o.   MRN: [id number]  DOS:  04/02/2022 Type of visit - description: Acute  Has noted bilateral ankle swelling for the last couple of weeks.  Worse at the end of the day. Denies any pain or redness on the ankle.  Weight is about the same, at some point he noted 5 pound increase however this morning he was almost back to baseline. Denies any unusual shortness of breath.  Wt Readings from Last 3 Encounters:  04/02/22 180 lb 8 oz (81.9 kg)  03/19/22 180 lb 2 oz (81.7 kg)  01/22/22 178 lb (80.7 kg)    Review of Systems See above   Past Medical History:  Diagnosis Date   Allergy    Anxiety    Arthritis    hands   Atrial fibrillation (HCC)    Colon cancer ([place])    s/p partial colectomy 1990, Cscope neg 7-09, next 2014   DISORDER, MITRAL VALVE    MV recontruction at [place] 2006----still needs ABX for SBE prophylaxis    Elevated PSA    prostate nodule, [name]   GERD (gastroesophageal reflux disease)    Heart murmur    no problems since surgery   HYPERGLYCEMIA, BORDERLINE 10/16/2008   HYPERLIPIDEMIA 06/15/2006   diet controlled   Hypertension    HYPERTROPHY PROSTATE W/O UR OBST & OTH LUTS 10/16/2008   Neuropathy    Right groin pain    Dr. Redmond Pulling   SCC (squamous cell carcinoma) 06/2017   s/p Mohs---[name]   Stroke [facility])    Wears partial dentures    lower partial    Past Surgical History:  Procedure Laterality Date   COLECTOMY  1990   HERNIA REPAIR Left 2014   [facility] rep w/mesh- March 2014   LOOP RECORDER INSERTION N/A 12/01/2016   Procedure: LOOP RECORDER INSERTION;  Surgeon: Evans Lance, MD;  Location: [place] [facility];  Service: Cardiovascular;  Laterality: N/A;   MITRAL VALVE ANNULOPLASTY     [facility] 2006   TEE WITHOUT CARDIOVERSION N/A 10/05/2016   Procedure: TRANSESOPHAGEAL ECHOCARDIOGRAM (TEE);  Surgeon: [name], MD;  Location: [facility] ENDOSCOPY;  Service: Cardiovascular;   Laterality: N/A;    Current Outpatient Medications  Medication Instructions   acetaminophen (TYLENOL) 1,300 mg, Oral, Every 8 hours PRN   ALPRAZolam (XANAX) 0.25 MG tablet TAKE 1 TABLET(0.25 MG) BY MOUTH TWICE DAILY AS NEEDED FOR ANXIETY   amLODipine (NORVASC) 5 mg, Oral, 2 times daily   apixaban (ELIQUIS) 5 mg, Oral, 2 times daily   Arginine 1,000 mg, Oral, Daily   Baclofen 5 MG TABS 1 tablet, Oral, 3 times daily PRN, for pain   Coenzyme Q10 100 MG TABS 1 tablet, Oral, 2 times daily   gabapentin (NEURONTIN) 300 MG capsule Take 3 capsules twice a day   Glucosamine-MSM-Hyaluronic Acd (JOINT HEALTH PO) 1 capsule, Oral, Daily   loratadine (CLARITIN) 10 mg, Oral, Daily at bedtime   losartan (COZAAR) 75 mg, Oral, Daily   metoprolol succinate (TOPROL-XL) 75 mg, Oral, Daily, Take with or immediately following a meal   Multiple Vitamins-Minerals (OCUVITE PRESERVISION PO) 1 capsule, Oral, 2 times daily   nitroGLYCERIN (NITROSTAT) 0.4 mg, Sublingual, Every 5 min x3 PRN   NON FORMULARY 1-2 packets, Oral, See admin instructions, Lypo-Spheric vitamin C gel packets- Mix 1-2 packets of gel into juice one to two times a day and drink    NON FORMULARY  10 mg, Oral, See admin instructions, Jarrow Formulas Lyco-Sorb (for Prostate & Cardiovascular Health) 10 mg capsules: Take 10 mg by mouth two times a day   Omega-3 Fatty Acids (FISH OIL PO) 1 capsule, Oral, 2 times daily   omeprazole (PRILOSEC) 20 mg, Oral, Daily   OXcarbazepine (TRILEPTAL) 150 MG tablet Take 1/2 tablet in AM, 1 tablet in PM   rosuvastatin (CRESTOR) 20 mg, Oral, Daily   Vitamin D, Cholecalciferol, 25 MCG (1000 UT) TABS 1 tablet, Oral, Daily       Objective:   Physical Exam BP 134/74   Pulse 61   Temp 97.8 F (36.6 C) (Oral)   Resp 18   Ht 5\' 10"  (1.778 m)   Wt 180 lb 8 oz (81.9 kg)   SpO2 96%   BMI 25.90 kg/m  General:   Well developed, NAD, BMI noted. HEENT:  Normocephalic . Face symmetric, atraumatic Lungs:  CTA B Normal  respiratory effort, no intercostal retractions, no accessory muscle use. Heart: RRR,  no murmur.  Lower extremities: +/+++ Edema from the mid pretibial area down particularly around the ankles. Skin: Not pale. Not jaundice Neurologic:  alert & oriented X3.  Speech normal, gait appropriate for age and unassisted Psych--  Cognition and judgment appear intact.  Cooperative with normal attention span and concentration.  Behavior appropriate. No anxious or depressed appearing.      Assessment      Assessment Prediabetes HTN Hyperlipidemia, (intolerant to simva-pravachol: shoulder pain) Anxiety- rarely takes xanax Neuro  -- Trigeminal neuralgia  dx 2015, no w/u, sx resurface x1 after temporary d/c of meds  --saw neuro again 02/2017, labs (-), had a NCS, Rx gaba -- strokes CV: --Mitral valve annuloplasty 2006-- needs SBE prophylaxis -- SOB 04-2016, seen at [facility], had a ECHO Nl fx MV, cath: no CAD --Cryptogenic stroke 09/12/2016, had slurred speech. 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 [name] Colon cancer partial colectomy 1990, colonoscopy 08-2007 negative, Cscope again 09-2012 normal, 5 years  SCC . MOHS 06/2017  PLAN: Lower extremity edema: As described above, no major change of his weight, no SOB. Amlodipine dose was increased from 5 mg to 10 mg few weeks ago.  Probably there is a relationship. I recommended to continue amlodipine and use a compression stocking but he is pretty adamant about take less amlodipine and Coreg for swelling. Plan: - Decrease amlodipine to 5 mg at night - Continue losartan 25 mg: 3 tablets in the morning and 1 tablet at 7 PM - Continue metoprolol 3 tablets daily - Monitor BPs, low-salt diet, leg elevation, call me in 2  weeks let me know how he is doing.   1-26 Chest pain: As described above, unlikely to represent heart trouble, CHF or a malignancy.  For now we agreed on observation, he will let me know if pain increases in intensity or frequency. A-fib Cardiovascular: saw cards   01/22/2022: He had few episodes of tachycardia felt to be from A-fib. They noticed his  BP to be labile. They recommended an echo. Trigeminal neuralgia: Saw neurology 01/18/2022, Rx to continue gabapentin, baclofen and oxcarbazepine    RTC: Already scheduled for March

## 2022-04-02 NOTE — Patient Instructions (Addendum)
Continue taking losartan 25 mg 3 tablets in the morning but also take losartan 25 mg 1 tablet at around 7 PM.  Decrease amlodipine 5 mg to only 1 tablet, take it at bedtime.  Check the  blood pressure regularly BP GOAL is between 110/65 and  135/85. If it is consistently higher or lower, let me know  The leg swelling should improve gradually.  Watch your salt intake and try to keep your legs elevated during the daytime.  Let me know how your blood pressure and swelling are going in 2 weeks.  Vaccines I recommend:  Covid booster   Please drop off a copy of your Healthcare Power of Attorney for your chart.

## 2022-04-04 LAB — DRUG MONITORING PANEL 375977 , URINE

## 2022-04-04 LAB — DM TEMPLATE

## 2022-04-04 NOTE — Assessment & Plan Note (Signed)
Lower extremity edema: As described above, no major change of his weight, no SOB. Amlodipine dose was increased from 5 mg to 10 mg few weeks ago.  Probably there is a relationship. I recommended to continue amlodipine and use a compression stocking but he is pretty adamant about take less amlodipine   Plan: - Decrease amlodipine to 5 mg at night - Continue losartan 25 mg: 3 tablets in the morning and add  1 tablet at 7 PM - Continue metoprolol 3 tablets daily - Monitor BPs, low-salt diet, leg elevation, call me in 2 weeks let me know how he is doing. HTN: see above

## 2022-04-05 ENCOUNTER — Encounter: Payer: Self-pay | Admitting: Internal Medicine

## 2022-04-05 ENCOUNTER — Other Ambulatory Visit: Payer: Self-pay | Admitting: Internal Medicine

## 2022-04-05 MED ORDER — CLONIDINE HCL 0.1 MG PO TABS: 0.1000 mg | ORAL_TABLET | Freq: Every day | ORAL | 0 refills | Status: DC | PRN

## 2022-04-06 ENCOUNTER — Telehealth: Payer: Self-pay | Admitting: Internal Medicine

## 2022-04-06 ENCOUNTER — Other Ambulatory Visit (INDEPENDENT_AMBULATORY_CARE_PROVIDER_SITE_OTHER): Payer: PPO | Admitting: Pharmacist

## 2022-04-06 DIAGNOSIS — I480 Paroxysmal atrial fibrillation: Secondary | ICD-10-CM

## 2022-04-06 DIAGNOSIS — I10 Essential (primary) hypertension: Secondary | ICD-10-CM

## 2022-04-06 NOTE — Patient Instructions (Addendum)
Mr. Wence It was a pleasure speaking with you today.  Below is information about how to check your blood pressure at home.   Remember your blood pressure goal is  between 110/65 and  135/85. If it is consistently higher or lower, please contact our office.    As always if you have any questions or concerns especially regarding medications, please feel free to contact me either at the phone number below or with a MyChart message.   Keep up the good work!  [name], PharmD Clinical Pharmacist [facility] [phone] (direct line)  [phone] (main office number)

## 2022-04-06 NOTE — Progress Notes (Signed)
04/06/2022 Name: Kevin Bass MRN: [id number] DOB: 03/17/1940  Chief Complaint  Patient presents with   Medication Management    Patient questions    Kevin Bass is a 81 y.o. year old male who presented for a telephone visit.   They were referred to the pharmacist by their PCP for assistance in managing hypertension and complex medication management.   Subjective:   Medication Access/Adherence  Current Pharmacy:  [facility] [address] [place] [address] Phone: [phone] Fax: [place], [address] [place] [address] Phone: [phone] Fax: [phone]   Patient reports affordability concerns with their medications:  Not currently - but does reach coverage gap later in year and requires assistance with Eliquis Patient reports access/transportation concerns to their pharmacy: No  Patient reports adherence concerns with their medications:  Yes  Patient was taking amlidpine 5mg  twice a day until 04/02/2022 when dose was lowered to once daily due to edema. Patient's only has a few days left but per his pharmacy he is not due to refill yet because they have been filling only the once daily Rx.    Hypertension:  Current medications: amlodipine 5mg  daily, losartan 25mg  - take 3 tablets = 75mg  each morning and 1 tablet = 25mg  each evening, metoprolol ER 25mg  take 3 tablets = 75mg  daily and clonidine 0.1mg  - take 1 tablet as needed for blood pressure > 160/90 Medications previously tried:   Patient has a validated, automated, upper arm home BP cuff Current blood pressure readings readings:  variable - ranges from 134 to 160 / 70 to 80  Patient denies hypotensive s/sx including no dizziness, lightheadedness.  Patient denies hypertensive symptoms including no headache, chest pain, shortness of breath  Reports edema  improved since dose of amlodipine was lowered.    Atrial Fibrillation:  Current medications: Rate Control: metoprolol ER 75mg  daily  Anticoagulation Regimen: Eliquis 5mg  twice a day   Objective:  Lab Results  Component Value Date   HGBA1C 5.9 12/30/2021    Lab Results  Component Value Date   CREATININE 1.08 11/25/2021   BUN 17 11/25/2021   NA 136 11/25/2021   K 4.6 11/25/2021   CL 99 11/25/2021   CO2 31 11/25/2021    Lab Results  Component Value Date   CHOL 111 08/24/2021   HDL 41 08/24/2021   LDLCALC 51 08/24/2021   LDLDIRECT 149.1 08/30/2012   TRIG 111 08/24/2021   CHOLHDL 2.7 08/24/2021    Medications Reviewed Today     Reviewed by Colon Branch, MD (Physician) on 04/02/22 at 1347  Med List Status: <None>   Medication Order Taking? Sig Documenting Provider Last Dose Status Informant  acetaminophen (TYLENOL) 650 MG CR tablet [id number] Yes Take 1,300 mg by mouth every 8 (eight) hours as needed for pain. [provider] Taking Active Multiple Informants  ALPRAZolam (XANAX) 0.25 MG tablet [id number] Yes TAKE 1 TABLET(0.25 MG) BY MOUTH TWICE DAILY AS NEEDED FOR ANXIETY Colon Branch, MD Taking Active   amLODipine (NORVASC) 5 MG tablet [id number] Yes Take 1 tablet (5 mg total) by mouth in the morning and at bedtime. Colon Branch, MD Taking Active   apixaban (ELIQUIS) 5 MG TABS tablet [id number] Yes Take 1 tablet (5 mg total) by mouth 2 (two) times daily. Berniece Salines, DO Taking Active   Arginine 500 MG CAPS [id number] Yes  Take 1,000 mg by mouth daily.  [provider] Taking Active Multiple Informants  Baclofen 5 MG TABS [id number] Yes Take 1 tablet by mouth 3 (three) times daily as needed. for pain Cameron Sprang, MD Taking Active   Coenzyme Q10 100 MG TABS [id number] Yes Take 1 tablet by mouth in the morning and at bedtime. [provider] Taking Active   gabapentin (NEURONTIN) 300 MG capsule [id number] Yes Take 3 capsules twice a day  Patient taking  differently: Take 300 mg by mouth. Take 2 in am and 3 at night   Cameron Sprang, MD Taking Active   Glucosamine-MSM-Hyaluronic Acd (JOINT HEALTH PO) [id number] Yes Take 1 capsule by mouth daily.  [provider] Taking Active Multiple Informants  loratadine (CLARITIN) 10 MG tablet [id number] Yes Take 10 mg by mouth at bedtime. [provider] Taking Active Multiple Informants           Med Note Pilar Plate Sep 12, 2016 11:40 AM)    losartan (COZAAR) 25 MG tablet [id number] Yes Take 3 tablets (75 mg total) by mouth daily. Colon Branch, MD Taking Active   metoprolol succinate (TOPROL-XL) 25 MG 24 hr tablet [id number] Yes Take 3 tablets (75 mg total) by mouth daily. Take with or immediately following a meal Colon Branch, MD Taking Active   Multiple Vitamins-Minerals (OCUVITE PRESERVISION PO) [id number] Yes Take 1 capsule by mouth 2 (two) times daily.  [provider] Taking Active Multiple Informants  nitroGLYCERIN (NITROSTAT) 0.4 MG SL tablet [id number] No Place 1 tablet (0.4 mg total) under the tongue every 5 (five) minutes x 3 doses as needed for chest pain.  Patient not taking: Reported on 03/19/2022   Colon Branch, MD Not Taking Active            Med Note [facility], Cheri Rous   Fri Mar 19, 2022 10:18 AM) PRN  Baruch Gouty [id number] Yes Take 1-2 packets by mouth See admin instructions. Lypo-Spheric vitamin C gel packets- Mix 1-2 packets of gel into juice one to two times a day and drink [provider] Taking Active Multiple Informants  NON FORMULARY [id number] Yes Take 10 mg by mouth See admin instructions. Jarrow Formulas Lyco-Sorb (for Prostate & Cardiovascular Health) 10 mg capsules: Take 10 mg by mouth two times a day [provider] Taking Active Multiple Informants  Omega-3 Fatty Acids (FISH OIL PO) [id number] Yes Take 1 capsule by mouth in the morning and at bedtime. [provider] Taking Active Multiple Informants  omeprazole  (PRILOSEC) 20 MG capsule [id number] Yes Take 20 mg by mouth daily. [provider] Taking Active   OXcarbazepine (TRILEPTAL) 150 MG tablet [id number] Yes Take 1/2 tablet in AM, 1 tablet in PM Cameron Sprang, MD Taking Active   rosuvastatin (CRESTOR) 20 MG tablet [id number] Yes Take 1 tablet (20 mg total) by mouth daily. Colon Branch, MD Taking Active   Vitamin D, Cholecalciferol, 25 MCG (1000 UT) TABS [id number] Yes Take 1 tablet by mouth daily. [provider] Taking Active               Assessment/Plan:   Hypertension: Currently uncontrolled - Reviewed long term cardiovascular and renal outcomes of uncontrolled blood pressure - Reviewed appropriate blood pressure monitoring technique and reviewed goal blood pressure. Recommended to check home blood pressure and heart rate once daily - Coordinated with pharmacy to get #15 amlodipine filled - at cost of $14.29 until he can  fill on insurance again 04/19/2022.  - continue to monitor edema.    Atrial Fibrillation: Currently controlled - Reviewed importance of adherence to anticoagulant for stroke prevention. - Recommend to continue Eliquis and metoprolol  Follow Up Plan: July 2024 - to check to see if patient has reached Medicare coverage gap and start process for medication assistance program.   [name], PharmD Clinical Pharmacist [facility]

## 2022-04-06 NOTE — Telephone Encounter (Signed)
Pt called stating that is having some issues with his medications from [place]. Advised pt that Tammy would call him back later to address this issue. Pt acknowledged understanding.

## 2022-04-06 NOTE — Telephone Encounter (Signed)
See phone visit

## 2022-04-12 ENCOUNTER — Telehealth: Payer: Self-pay | Admitting: Internal Medicine

## 2022-04-12 MED ORDER — LOSARTAN POTASSIUM 25 MG PO TABS: ORAL_TABLET | ORAL | 3 refills | Status: DC

## 2022-04-12 NOTE — Telephone Encounter (Signed)
Rx sent. Mychart message sent to Pt.

## 2022-04-12 NOTE — Telephone Encounter (Signed)
Patient called to advise that he needs the losartan prescription updated to 3 pills in the morning and 1 in the evening. Looks like medication was updated on 04/02/22 but could not tell if it was actually sent to his pharmacy. Pt said he has not received a text from pharmacy indicating medication is ready.  Please review and confirm with patient if updated losartan prescription has been sent in to [place] pharmacy. He would like a call back to advise.

## 2022-04-14 ENCOUNTER — Encounter: Payer: Self-pay | Admitting: Internal Medicine

## 2022-04-15 MED ORDER — CLONIDINE HCL 0.1 MG PO TABS: 0.1000 mg | ORAL_TABLET | Freq: Three times a day (TID) | ORAL | Status: DC

## 2022-04-15 NOTE — Telephone Encounter (Signed)
Will increase clonidine 0.1 mg to 1 tablet 3 times a day. Other medications the same Let me know next week how this is working [employer]

## 2022-04-16 ENCOUNTER — Telehealth: Payer: Self-pay | Admitting: Internal Medicine

## 2022-04-16 ENCOUNTER — Ambulatory Visit: Payer: PPO | Admitting: Internal Medicine

## 2022-04-16 NOTE — Telephone Encounter (Signed)
Pt c/o BP issue: STAT if pt c/o blurred vision, one-sided weakness or slurred speech  1. What are your last 5 BP readings? 04-13-22    167/92,  185/90 04-14-22   183/91, 04-15-22  169/77   this morning 149/80- he says in  theevening his pressure goes very high  2. Are you having any other symptoms (ex. Dizziness, headache, blurred vision, passed out)? Dizziness, unstable sometimes, because he is dizzy. The medicine made him a little confused at times.   3. What is your BP issue? High blood pressure- patient wants to be seen

## 2022-04-16 NOTE — Telephone Encounter (Signed)
Pt c/o BP issue: STAT if pt c/o blurred vision, one-sided weakness or slurred speech   1. What are your last 5 BP readings? 04-13-22    167/92,  185/90 04-14-22   183/91, 04-15-22  169/77   this morning 149/80- he says in  theevening his pressure goes very high   2. Are you having any other symptoms (ex. Dizziness, headache, blurred vision, passed out)? Dizziness, unstable sometimes, because he is dizzy. The medicine made him a little confused at times.    3. What is your BP issue? High blood pressure- patient wants to be seen   Pt states that BP seems to go up a lot in the evening around 6-7pm around 2 weeks. He noticed that his face would get hot/warm; flushing and then checked his BP and it was elevated. Went to see his PCP and he prescribed clonidine to take 0.'1mg'$  3 times a day. Started experiencing dizziness after taking this medication.   Has been taking clonidine for about a week and it causes dizziness. He reports that he is still having these "episodes" in the evening with this medication.  He is worried that something might be going on with his Mitral Valve, leaking?   He denies any chest, back, arm pain; no nausea/vomiting, sweating, swelling. Just experiencing some flushing of face in evenings- then checks BP, and it is elevated; and dizziness only started with the new medication.   Appt for 04/21/22 at 1000 with Dr Debara Pickett  Given ER and NTG precautions and instructions. He verbalized understanding.

## 2022-04-19 ENCOUNTER — Ambulatory Visit: Payer: PPO | Admitting: Neurology

## 2022-04-20 ENCOUNTER — Encounter: Payer: Self-pay | Admitting: Internal Medicine

## 2022-04-21 ENCOUNTER — Ambulatory Visit: Payer: PPO | Attending: Internal Medicine | Admitting: Internal Medicine

## 2022-04-21 ENCOUNTER — Encounter: Payer: Self-pay | Admitting: Internal Medicine

## 2022-04-21 VITALS — BP 120/60 | HR 64 | Ht 70.0 in | Wt 182.0 lb

## 2022-04-21 DIAGNOSIS — I10 Essential (primary) hypertension: Secondary | ICD-10-CM | POA: Diagnosis not present

## 2022-04-21 DIAGNOSIS — Z9889 Other specified postprocedural states: Secondary | ICD-10-CM

## 2022-04-21 DIAGNOSIS — I480 Paroxysmal atrial fibrillation: Secondary | ICD-10-CM | POA: Diagnosis not present

## 2022-04-21 MED ORDER — TELMISARTAN 80 MG PO TABS: 80.0000 mg | ORAL_TABLET | Freq: Every day | ORAL | 3 refills | Status: DC

## 2022-04-21 NOTE — Patient Instructions (Signed)
Medication Instructions:  STOP losartan  START telmisartan '80mg'$  daily   *If you need a refill on your cardiac medications before your next appointment, please call your pharmacy*  Testing/Procedures: Please schedule echo when able   Follow-Up: At [facility], you and your health needs are our priority.  As part of our continuing mission to provide you with exceptional heart care, we have created designated Provider Care Teams.  These Care Teams include your primary Cardiologist (physician) and Advanced Practice Providers (APPs -  Physician Assistants and Nurse Practitioners) who all work together to provide you with the care you need, when you need it.  We recommend signing up for the patient portal called "MyChart".  Sign up information is provided on this After Visit Summary.  MyChart is used to connect with patients for Virtual Visits (Telemedicine).  Patients are able to view lab/test results, encounter notes, upcoming appointments, etc.  Non-urgent messages can be sent to your provider as well.   To learn more about what you can do with MyChart, go to [email or web address].    Your next appointment:   Follow up in 2-3 months with Dr. Debara Pickett -- after echo

## 2022-04-21 NOTE — Progress Notes (Signed)
OFFICE CONSULT NOTE  Chief Complaint:  Elevated blood pressure  Primary Care Physician: Colon Branch, MD  HPI:  Kevin Bass is a 82 y.o. male who is being seen today for the evaluation of workup of cryptogenic stroke at the request of Colon Branch, MD. Kevin Bass is a pleasant 82 year old patient who recently had an unfortunate cryptogenic stroke. His past ocular history significant for mitral valve disease status post repair at [facility]. He is followed by cardiologist there and recently was admitted to [facility] with cryptogenic stroke. As per routine care or transesophageal echocardiogram as well as a implanted loop recorder were recommended. Unfortunately there was difficulty during the transesophageal echocardiogram procedure which involved technical difficulties with equipment and it was not able to be completed. There was some concern that the probe could not be passed adequately and he underwent upper esophageal imaging which was normal. He has had prior teas without any difficulty. After that he decided against a loop recorder and wanted to get another opinion from his cardiologist at [facility]. There was a visit there which did not indicate that a transesophageal echocardiogram would be helpful. I do not see any evidence on his prior echocardiograms of a bubble study to exclude PFO. This could be a significant finding as there is evidence now that closure of a small PFO in the setting of a patient whose had a cryptogenic stroke is guideline indicated. It's also possible that he could have atrial fibrillation, especially given his history of mitral valve disease and valve repair which is a significant risk factor for arrhythmias.  12/21/2016  Kevin Bass was seen today in follow-up.  He underwent a repeat echocardiogram which did not demonstrate any LV thrombus or evidence for atrial septal defect.  Subsequently he had placement of an implanted loop recorder by Dr. Lovena Le which  was seen in follow-up and appears to be well-healed.  There is no evidence today of any significant arrhythmias such as atrial fibrillation.  He is currently on aspirin and Plavix for stroke prevention.  He is also had increasing doses of medication to control hypertension.  Currently his blood pressure is well controlled at 108/70.  He is appropriately on rosuvastatin, ezetimibe and fish oil with an LDL-C is 60 approximately 3 weeks ago.  06/17/2017  Kevin Bass returns today for follow-up.  Overall he says he feels fairly well.  He denies any significant palpitations or recurrent atrial fibrillation.  EKG today shows normal sinus rhythm at 62.  He has been working with Dr. [name] for neuropathy.  Is currently on very high dose of gabapentin (a total of 1500 mg daily).  He is tolerating apixaban without any bleeding difficulty and cholesterol has been well controlled on rosuvastatin and ezetimibe.  Only, today he is complaining of some lower extremity discomfort and minimal edema.  He says this is worse on long car rides.  He does have notable bilateral lower extremity varicosities, and would likely benefit from compression stockings.  07/11/2017  Kevin Bass was seen today in follow-up.  I last saw him about a month ago.  He was doing well without any symptomatic A. fib.  His A. fib was detected by implanted loop recorder.  He has been on anticoagulation.  Most recently he was in the emergency department for symptomatic atrial fibrillation with heart rate as high as 200 when he was seen by EMS.  He was treated and seen in the emergency department.  He was started on metoprolol  and converted back to sinus rhythm.  He had questions about the value of the implanted loop recorder, however I reminded him that his job was not to notify you of arrhythmias as they were happening.    03/07/2018  Kevin Bass returns today for follow-up.  Recently he has been reported labile blood pressure.  He notes that he is  having spikes in his blood pressure mostly in the afternoons at times up to 170 although has some lower blood pressures in the morning.  I personally reviewed his blood pressure cuff (which incidentally is close to 84 years old) and straining some lability in his blood pressures.  Currently he is taking 75 mg of metoprolol in the morning along with 50 mg of losartan and was also prescribed to take an additional hydrochlorothiazide.  He said the hydrochlorothiazide was making him dizzy and therefore he has stopped the medication.  Of note he did have a recurrent TIA affecting the right PICA territory.  There was some associated transient vertigo.  This apparently happened after holding his Eliquis for 2 days due to a scheduled median and ulnar nerve decompression.  It was felt that A. fib was the cause of this however it seems fairly unlikely that he is developed thrombus during that period of time to cause stroke.  I do believe there is likely another explanation for this cause.  I do not want him to feel that he could never come off of blood thinners because of the risk for stroke.  09/21/2018  Kevin Bass was seen today in follow-up.  Overall he seems to be doing better.  Fortunately had no further strokes after that episode where he discontinued his Eliquis for 4 doses.  He has decreased his Toprol-XL down to 25 mg from 75 mg daily.  Blood pressure is well controlled and heart rate is in the low 60s.  He continues to have remote checks without any significant findings.  The plan is to allow the device to run out of battery life and then he may consider either explantation or just leaving it.  He is on Eliquis without any bleeding complications.  He last had an echo in October 2019 which showed stability of his mitral valve repair.  05/29/2019  Kevin Bass returns today for follow-up.  Overall he says he is feeling well.  Denies any chest pain or worsening shortness of breath.  Recent lipids are excellent  in fact has had a marked reduction in his LDL cholesterol.  He says it previously was because he had stopped taking his statin and was only on the Zetia due to concern for side effects however he felt like he was not having significant side effects and therefore went back on the medicine.  More recently total cholesterol is now 91, triglycerides 127, HDL 40 and LDL of 26 (reduced from 120).  Blood pressure is well controlled today.  He is blood pressure though is labile and by history his PCP has adjusted his medications.  Specifically his losartan has been decreased to 75 mg daily in the morning and metoprolol XL has decreased to 75 mg as well.  He denies any further TIA events.  He denies any bleeding difficulty on Eliquis.  01/22/2022  Kevin Bass is seen today in follow-up.  Overall he says he is doing pretty well.  He was seen by my partner last year for some chest pain.  She recommended a stress test but he was hesitant to proceed.  He  spoke with me on the phone and I recommended we go ahead and do that.  Fortunately came back negative.  This year he was in the emergency department in October with elevated blood pressure.  He says occasionally gets spikes with this.  Since then however it has been better regulated.  He denies any chest pain or shortness of breath.  04/21/2022  Kevin Bass returns today for follow-up.  He has been concerned about elevated blood pressures at home.  He works out to his primary care provider about this who recommended an increase in his clonidine up to 0.1 mg 3 times daily.  He has had side effects with this and is currently only taking 0.1 mg at night in addition to 5 mg of amlodipine and he takes losartan 75 mg in the morning and 25 mg at night along with Toprol-XL 75 mg daily.  In general he says his blood pressures are better in the morning although a number of his readings have been elevated.  Most of his blood pressures seem high between 0000000 up to [id number] systolic.   Blood pressure today however was normal at 120/60.  He denies any issues with his cough and says he has 2 blood pressure cuffs which he feels are fairly recent and accurate.  PMHx:  Past Medical History:  Diagnosis Date   Allergy    Anxiety    Arthritis    hands   Atrial fibrillation ([place])    Colon cancer ([place])    s/p partial colectomy 1990, Cscope neg 7-09, next 2014   DISORDER, MITRAL VALVE    MV recontruction at [place] 2006----still needs ABX for SBE prophylaxis    Elevated PSA    prostate nodule, [name]   GERD (gastroesophageal reflux disease)    Heart murmur    no problems since surgery   HYPERGLYCEMIA, BORDERLINE 10/16/2008   HYPERLIPIDEMIA 06/15/2006   diet controlled   Hypertension    HYPERTROPHY PROSTATE W/O UR OBST & OTH LUTS 10/16/2008   Neuropathy    Right groin pain    Dr. Redmond Pulling   SCC (squamous cell carcinoma) 06/2017   s/p Mohs---[name]   Stroke [facility])    Wears partial dentures    lower partial    Past Surgical History:  Procedure Laterality Date   COLECTOMY  1990   HERNIA REPAIR Left 2014   [facility] rep w/mesh- March 2014   LOOP RECORDER INSERTION N/A 12/01/2016   Procedure: LOOP RECORDER INSERTION;  Surgeon: Evans Lance, MD;  Location: [place] [facility];  Service: Cardiovascular;  Laterality: N/A;   MITRAL VALVE ANNULOPLASTY     [facility] 2006   TEE WITHOUT CARDIOVERSION N/A 10/05/2016   Procedure: TRANSESOPHAGEAL ECHOCARDIOGRAM (TEE);  Surgeon: [name], MD;  Location: [facility];  Service: Cardiovascular;  Laterality: N/A;    FAMHx:  Family History  Problem Relation Age of Onset   Colon cancer Other        M side (cousins)   Diabetes Neg Hx    Stroke Neg Hx    CAD Neg Hx    Prostate cancer Neg Hx    Rectal cancer Neg Hx    Esophageal cancer Neg Hx     SOCHx:   reports that he quit smoking about 39 years ago. His smoking use included cigarettes. He has a 10.00 pack-year smoking history. He has never used smokeless tobacco. He reports  current alcohol use of about 7.0 standard drinks of alcohol per week. He  reports that he does not use drugs.  ALLERGIES:  Allergies  Allergen Reactions   Prednisolone Acetate     Increased intraocular pressure   Statins Other (See Comments)    Myalgias (intolerance) Simvastatin, atorvastatin, pravastatin    ROS: Pertinent items noted in HPI and remainder of comprehensive ROS otherwise negative.  HOME MEDS: Current Outpatient Medications on File Prior to Visit  Medication Sig Dispense Refill   acetaminophen (TYLENOL) 650 MG CR tablet Take 1,300 mg by mouth every 8 (eight) hours as needed for pain.     ALPRAZolam (XANAX) 0.25 MG tablet TAKE 1 TABLET(0.25 MG) BY MOUTH TWICE DAILY AS NEEDED FOR ANXIETY 60 tablet 1   amLODipine (NORVASC) 5 MG tablet Take 1 tablet (5 mg total) by mouth at bedtime.     apixaban (ELIQUIS) 5 MG TABS tablet Take 1 tablet (5 mg total) by mouth 2 (two) times daily. 180 tablet 1   Arginine 500 MG CAPS Take 1,000 mg by mouth daily.      Baclofen 5 MG TABS Take 1 tablet by mouth 3 (three) times daily as needed. for pain 90 tablet 6   cloNIDine (CATAPRES) 0.1 MG tablet Take 1 tablet (0.1 mg total) by mouth 3 (three) times daily.     Coenzyme Q10 100 MG TABS Take 1 tablet by mouth in the morning and at bedtime.     gabapentin (NEURONTIN) 300 MG capsule Take 3 capsules twice a day (Patient taking differently: Take 300 mg by mouth. Take 2 in am and 3 at night) 540 capsule 3   Glucosamine-MSM-Hyaluronic Acd (JOINT HEALTH PO) Take 1 capsule by mouth daily.      loratadine (CLARITIN) 10 MG tablet Take 10 mg by mouth at bedtime.     losartan (COZAAR) 25 MG tablet Take 3 tablets by mouth every morning and 1 tablet by mouth every evening 120 tablet 3   metoprolol succinate (TOPROL-XL) 25 MG 24 hr tablet Take 3 tablets (75 mg total) by mouth daily. Take with or immediately following a meal 270 tablet 1   Multiple Vitamins-Minerals (OCUVITE PRESERVISION PO) Take 1 capsule by  mouth 2 (two) times daily.      nitroGLYCERIN (NITROSTAT) 0.4 MG SL tablet Place 1 tablet (0.4 mg total) under the tongue every 5 (five) minutes x 3 doses as needed for chest pain. 25 tablet 3   NON FORMULARY Take 1-2 packets by mouth See admin instructions. Lypo-Spheric vitamin C gel packets- Mix 1-2 packets of gel into juice one to two times a day and drink     NON FORMULARY Take 10 mg by mouth See admin instructions. [place] Formulas Lyco-Sorb (for Prostate & Cardiovascular Health) 10 mg capsules: Take 10 mg by mouth two times a day     Omega-3 Fatty Acids (FISH OIL PO) Take 1 capsule by mouth in the morning and at bedtime.     omeprazole (PRILOSEC) 20 MG capsule Take 20 mg by mouth daily.     OXcarbazepine (TRILEPTAL) 150 MG tablet Take 1/2 tablet in AM, 1 tablet in PM 135 tablet 3   rosuvastatin (CRESTOR) 20 MG tablet Take 1 tablet (20 mg total) by mouth daily. 90 tablet 3   Vitamin D, Cholecalciferol, 25 MCG (1000 UT) TABS Take 1 tablet by mouth daily.     No current facility-administered medications on file prior to visit.    LABS/IMAGING: No results found for this or any previous visit (from the past 48 hour(s)). No results found.  LIPID PANEL:  Component Value Date/Time   CHOL 111 08/24/2021 1352   TRIG 111 08/24/2021 1352   HDL 41 08/24/2021 1352   CHOLHDL 2.7 08/24/2021 1352   VLDL 25.4 12/11/2018 1101   LDLCALC 51 08/24/2021 1352   LDLDIRECT 149.1 08/30/2012 1352    WEIGHTS: Wt Readings from Last 3 Encounters:  04/21/22 182 lb (82.6 kg)  04/02/22 180 lb 8 oz (81.9 kg)  03/19/22 180 lb 2 oz (81.7 kg)    VITALS: BP 120/60 (BP Location: Left Arm, Patient Position: Sitting, Cuff Size: Normal)   Pulse 64   Ht '5\' 10"'$  (1.778 m)   Wt 182 lb (82.6 kg)   SpO2 98%   BMI 26.11 kg/m   EXAM: General appearance: alert and no distress Neck: no carotid bruit, no JVD, and thyroid not enlarged, symmetric, no tenderness/mass/nodules Lungs: clear to auscultation  bilaterally Heart: regular rate and rhythm, S1, S2 normal, and systolic murmur: early systolic 2/6, blowing at apex Abdomen: soft, non-tender; bowel sounds normal; no masses,  no organomegaly Extremities: extremities normal, atraumatic, no cyanosis or edema and varicose veins noted Pulses: 2+ and symmetric Skin: Skin color, texture, turgor normal. No rashes or lesions Neurologic: Grossly normal Psych: Pleasant  EKG: Deferred  ASSESSMENT: Labile blood pressure Recurrent stroke Status post mitral valve repair A-fib - S/p ILR CHADSVASC Score of 4 - on Eliquis Essential hypertension Dyslipidemia  PLAN: 1.   Mr. Stitz has had issues with ongoing labile blood pressure most notably high recently.  This seems to be worse as the day goes on.  He has not tolerated higher doses of clonidine.  He is maxed out on a number of his other medications.  He is on a first generation ARB and would do better with a longer acting later generation medication which hopefully will give him better control throughout the day.  I advised switching his losartan over to telmisartan 80 mg daily which she can take in the morning.  He should continue with his Toprol in the morning, amlodipine at night and clonidine before bed.  Will arrange for follow-up in a few months to recheck blood pressure.  He is also again concerned about the status of his mitral valve repair.  There is not any significant murmur and I do not understand how this would cause him any significant issues with hypertension unless he was markedly volume overloaded.  I will however go ahead and order repeat echo which we will plan to do next year somewhat earlier.  [name], MD, [facility], [place] Director of the [facility] Lipid Disorders &  Cardiovascular Risk Reduction [facility] Diplomate of the American Board of Clinical Lipidology Attending Cardiologist  Direct Dial: [phone]  Fax: [phone]  Website:   [email or web address] 04/21/2022, 10:27 AM

## 2022-04-22 ENCOUNTER — Telehealth: Payer: Self-pay | Admitting: Internal Medicine

## 2022-04-22 NOTE — Telephone Encounter (Signed)
Pt c/o medication issue:  1. Name of Medication:   telmisartan (MICARDIS) 80 MG tablet   2. How are you currently taking this medication (dosage and times per day)?   No taking yet  3. Are you having a reaction (difficulty breathing--STAT)?   N/A  4. What is your medication issue?   Patient wants clarification on the instructions for this medication.

## 2022-04-22 NOTE — Telephone Encounter (Signed)
Called pt, went over medication questions. No further questions or concerns at this time.

## 2022-04-26 ENCOUNTER — Telehealth: Payer: Self-pay | Admitting: Internal Medicine

## 2022-04-26 NOTE — Telephone Encounter (Signed)
[name], MD  You    I agree that the new medication has probably not taken full effect. I would stick with it.  I would also advise moving the amlodipine to take at lunch. He can continue to take the clonidine at night.  Dr. Luciano Cutter with pt Dr. Lysbeth Penner recommendations. Pt will go back to telmisartan '80mg'$  once daily. He will start taking amlodipine '5mg'$  around lunch time and continue clonidine 0.'1mg'$  at night. Pt will continue to monitor his blood pressure, specifically taking his blood pressure 1-2 hours after taking his medication. He will give the medication about 2 weeks to take full effect, but will let us know if he continues to have spikes in the evening. Pt verbalizes understanding

## 2022-04-26 NOTE — Telephone Encounter (Signed)
Pt c/o medication issue:  1. Name of Medication:  telmisartan (MICARDIS) 80 MG tablet  2. How are you currently taking this medication (dosage and times per day)?  Once daily as prescribed, around 9:30 AM  3. Are you having a reaction (difficulty breathing--STAT)?   4. What is your medication issue?   Patient states this medication has not been helping to regulate his BP. He states he takes it around 9:30 AM every morning and his BP remains stable temporarily, but within 3-4 hours it rises to the 170/90 range. Patient states he has switched to Losartan and it seems to be working for him. Patient would like to know what Dr. Debara Pickett recommends.

## 2022-04-26 NOTE — Telephone Encounter (Signed)
Spoke with pt regarding telmisartan medication. Pt states that 4 hours after taking medication his pressure is back up to 170/90 range. Pt states after this happened for a few days in a row he decided to switch back to losartan. Pt had an office visit on Wednesday (2/28) and on Sunday switched himself back to losartan. Advised pt that medication may take up to a couple of weeks to be fully working. Pt states he was unaware of this. Pt states that he is concerned in the afternoon when his blood pressure is 170/90 and doesn't know what to do at that time to get his blood pressure down to a normal level. Will forward to Dr. Debara Pickett to advise. Pt verbalizes understanding.

## 2022-04-30 ENCOUNTER — Ambulatory Visit: Payer: PPO | Admitting: Internal Medicine

## 2022-05-03 ENCOUNTER — Ambulatory Visit (INDEPENDENT_AMBULATORY_CARE_PROVIDER_SITE_OTHER): Payer: PPO | Admitting: Pharmacist

## 2022-05-03 VITALS — BP 132/78 | HR 70 | Wt 177.2 lb

## 2022-05-03 DIAGNOSIS — I10 Essential (primary) hypertension: Secondary | ICD-10-CM

## 2022-05-03 DIAGNOSIS — I480 Paroxysmal atrial fibrillation: Secondary | ICD-10-CM

## 2022-05-03 NOTE — Progress Notes (Signed)
05/03/2022 Name: Kevin Bass MRN: [id number] DOB: 13-Jul-1940  No chief complaint on file.   Kevin Bass is a 82 y.o. year old male who presented for a telephone visit.   They were referred to the pharmacist by their PCP for assistance in managing hypertension and complex medication management.   Subjective:   Medication Access/Adherence  Current Pharmacy:  [facility], [address] AT [address] [place] [address] Phone: [phone] Fax: [place], [address] [place] [address] Phone: [phone] Fax: [phone]   Patient reports affordability concerns with their medications:  Not currently - but does reach coverage gap later in year and requires assistance with Eliquis Patient reports access/transportation concerns to their pharmacy: No  Patient reports adherence concerns with their medications:  Yes  - stopped clonidine due to dizziness  Hypertension:  Current medications: amlodipine '5mg'$  daily, telmisartan '80mg'$  daily, metoprolol ER '25mg'$  take 3 tablets = '75mg'$  daily and clonidine 0.'1mg'$  - take 1 tablet at bedtime  Clonidine on med list but patient reports he is not taking due to dizziness.   Medications previously tried: losartan - variable blood pressure, amlodipine '10mg'$  - caused edema  Patient has a validated, automated, upper arm home BP cuff Current blood pressure readings readings:   Morning: 130's / 80's  Evening: 130's / 80's  Patient denies hypotensive s/sx including no dizziness, lightheadedness.  Patient denies hypertensive symptoms including no headache, chest pain, shortness of breath   Atrial Fibrillation:  Current medications: Rate Control: metoprolol ER '75mg'$  daily  Anticoagulation Regimen: Eliquis '5mg'$  twice a day   Objective:  Lab Results  Component Value Date   HGBA1C 5.9 12/30/2021    Lab  Results  Component Value Date   CREATININE 1.08 11/25/2021   BUN 17 11/25/2021   NA 136 11/25/2021   K 4.6 11/25/2021   CL 99 11/25/2021   CO2 31 11/25/2021    Lab Results  Component Value Date   CHOL 111 08/24/2021   HDL 41 08/24/2021   LDLCALC 51 08/24/2021   LDLDIRECT 149.1 08/30/2012   TRIG 111 08/24/2021   CHOLHDL 2.7 08/24/2021    Medications Reviewed Today     Reviewed by Amalia Hailey, CMA (Certified Medical Assistant) on 04/21/22 at 1009  Med List Status: <None>   Medication Order Taking? Sig Documenting Provider Last Dose Status Informant  acetaminophen (TYLENOL) 650 MG CR tablet [id number] Yes Take 1,300 mg by mouth every 8 (eight) hours as needed for pain. [provider] Taking Active Multiple Informants  ALPRAZolam (XANAX) 0.25 MG tablet [id number]  TAKE 1 TABLET(0.25 MG) BY MOUTH TWICE DAILY AS NEEDED FOR ANXIETY Colon Branch, MD  Active   amLODipine (NORVASC) 5 MG tablet [id number]  Take 1 tablet (5 mg total) by mouth at bedtime. Colon Branch, MD  Active   apixaban (ELIQUIS) 5 MG TABS tablet [id number]  Take 1 tablet (5 mg total) by mouth 2 (two) times daily. Berniece Salines, DO  Active   Arginine 500 MG CAPS [id number]  Take 1,000 mg by mouth daily.  [provider]  Active Multiple Informants  Baclofen 5 MG TABS [id number]  Take 1 tablet by mouth 3 (three) times daily as needed. for pain Cameron Sprang, MD  Active   cloNIDine (CATAPRES) 0.1 MG tablet [id number]  Take 1 tablet (0.1 mg total) by mouth 3 (three) times daily. [name],  Alda Berthold, MD  Active   Coenzyme Q10 100 MG TABS [id number]  Take 1 tablet by mouth in the morning and at bedtime. [provider]  Active   gabapentin (NEURONTIN) 300 MG capsule [id number]  Take 3 capsules twice a day  Patient taking differently: Take 300 mg by mouth. Take 2 in am and 3 at night   Cameron Sprang, MD  Active   Glucosamine-MSM-Hyaluronic Acd (JOINT HEALTH PO) [id number]  Take 1 capsule by mouth daily.  [provider]  Active Multiple Informants  loratadine (CLARITIN) 10 MG tablet [id number]  Take 10 mg by mouth at bedtime. [provider]  Active Multiple Informants           Med Note Pilar Plate Sep 12, 2016 11:40 AM)    losartan (COZAAR) 25 MG tablet [id number]  Take 3 tablets by mouth every morning and 1 tablet by mouth every evening Kathlene November E, MD  Active   metoprolol succinate (TOPROL-XL) 25 MG 24 hr tablet [id number]  Take 3 tablets (75 mg total) by mouth daily. Take with or immediately following a meal Colon Branch, MD  Active   Multiple Vitamins-Minerals (OCUVITE PRESERVISION PO) [id number]  Take 1 capsule by mouth 2 (two) times daily.  [provider]  Active Multiple Informants  nitroGLYCERIN (NITROSTAT) 0.4 MG SL tablet [id number]  Place 1 tablet (0.4 mg total) under the tongue every 5 (five) minutes x 3 doses as needed for chest pain. Colon Branch, MD  Active            Med Note (CANTER, Vilma Prader D   Fri Mar 19, 2022 10:18 AM) PRN  Baruch Gouty [id number]  Take 1-2 packets by mouth See admin instructions. Lypo-Spheric vitamin C gel packets- Mix 1-2 packets of gel into juice one to two times a day and drink [provider]  Active Multiple Informants  NON FORMULARY [id number]  Take 10 mg by mouth See admin instructions. Jarrow Formulas Lyco-Sorb (for Prostate & Cardiovascular Health) 10 mg capsules: Take 10 mg by mouth two times a day [provider]  Active Multiple Informants  Omega-3 Fatty Acids (FISH OIL PO) [id number]  Take 1 capsule by mouth in the morning and at bedtime. [provider]  Active Multiple Informants  omeprazole (PRILOSEC) 20 MG capsule [id number]  Take 20 mg by mouth daily. [provider]  Active   OXcarbazepine (TRILEPTAL) 150 MG tablet [id number]  Take 1/2 tablet in AM, 1 tablet in PM Cameron Sprang, MD  Active   rosuvastatin (CRESTOR) 20 MG tablet [id number]  Take 1 tablet (20 mg total) by mouth daily. Colon Branch, MD  Active   Vitamin D, Cholecalciferol, 25 MCG (1000 UT) TABS [id number]  Take 1 tablet by mouth daily. [provider]  Active               Assessment/Plan:   Hypertension: Currently controlled - Reviewed long term cardiovascular and renal outcomes of uncontrolled blood pressure - Reviewed appropriate blood pressure monitoring technique and reviewed goal blood pressure. Recommended to check home blood pressure and heart rate once daily - Continue current mediations for blood pressure    Atrial Fibrillation: Currently controlled - Reviewed importance of adherence to anticoagulant for stroke prevention. - Recommend to continue Eliquis and metoprolol  Follow Up Plan: July 2024 - to check to see if patient has reached Medicare coverage gap and start process for medication assistance program.  [name], PharmD Clinical Pharmacist [facility]

## 2022-05-08 ENCOUNTER — Other Ambulatory Visit: Payer: Self-pay | Admitting: Internal Medicine

## 2022-05-10 ENCOUNTER — Ambulatory Visit (INDEPENDENT_AMBULATORY_CARE_PROVIDER_SITE_OTHER): Payer: PPO | Admitting: Internal Medicine

## 2022-05-10 ENCOUNTER — Encounter: Payer: Self-pay | Admitting: Internal Medicine

## 2022-05-10 VITALS — BP 128/78 | HR 57 | Temp 97.8°F | Resp 18 | Ht 70.0 in | Wt 179.4 lb

## 2022-05-10 DIAGNOSIS — I10 Essential (primary) hypertension: Secondary | ICD-10-CM

## 2022-05-10 MED ORDER — OMEPRAZOLE 20 MG PO CPDR: 20.0000 mg | DELAYED_RELEASE_CAPSULE | Freq: Every day | ORAL | 1 refills | Status: DC

## 2022-05-10 NOTE — Progress Notes (Signed)
Subjective:    Patient ID: Kevin Bass, male    DOB: 17-Jul-1940, 82 y.o.   MRN: [id number]  DOS:  05/10/2022 Type of visit - description: Follow-up  Here for BP management Since the last office visit, saw cardiology 04/21/2022, note reviewed: - Intolerant to higher doses of clonidine - Switched from losartan to telmisartan.  Since then he continues to be worried about his blood pressure as is still high at times. In the morning is typically okay ~ 141/75s  In the afternoon systolic BPs [id number], 123456, [id number], 146, 155.  Diastolic BP usually 70 and 80.  Heart rate in the 60s  Review of Systems See above   Past Medical History:  Diagnosis Date   Allergy    Anxiety    Arthritis    hands   Atrial fibrillation ([place])    Colon cancer ([place])    s/p partial colectomy 1990, Cscope neg 7-09, next 2014   DISORDER, MITRAL VALVE    MV recontruction at [place] 2006----still needs ABX for SBE prophylaxis    Elevated PSA    prostate nodule, [name]   GERD (gastroesophageal reflux disease)    Heart murmur    no problems since surgery   HYPERGLYCEMIA, BORDERLINE 10/16/2008   HYPERLIPIDEMIA 06/15/2006   diet controlled   Hypertension    HYPERTROPHY PROSTATE W/O UR OBST & OTH LUTS 10/16/2008   Neuropathy    Right groin pain    Dr. Redmond Pulling   SCC (squamous cell carcinoma) 06/2017   s/p Mohs---[name]   Stroke [facility])    Wears partial dentures    lower partial    Past Surgical History:  Procedure Laterality Date   COLECTOMY  1990   HERNIA REPAIR Left 2014   [facility] rep w/mesh- March 2014   LOOP RECORDER INSERTION N/A 12/01/2016   Procedure: LOOP RECORDER INSERTION;  Surgeon: Evans Lance, MD;  Location: [facility] CV LAB;  Service: Cardiovascular;  Laterality: N/A;   MITRAL VALVE ANNULOPLASTY     [facility] 2006   TEE WITHOUT CARDIOVERSION N/A 10/05/2016   Procedure: TRANSESOPHAGEAL ECHOCARDIOGRAM (TEE);  Surgeon: [name], MD;  Location: [facility] ENDOSCOPY;  Service: Cardiovascular;   Laterality: N/A;    Current Outpatient Medications  Medication Instructions   acetaminophen (TYLENOL) 1,300 mg, Oral, Every 8 hours PRN   ALPRAZolam (XANAX) 0.25 MG tablet TAKE 1 TABLET(0.25 MG) BY MOUTH TWICE DAILY AS NEEDED FOR ANXIETY   amLODipine (NORVASC) 5 mg, Oral, Daily at bedtime   apixaban (ELIQUIS) 5 mg, Oral, 2 times daily   Arginine 1,000 mg, Oral, Daily   Baclofen 5 MG TABS 1 tablet, Oral, 3 times daily PRN, for pain   Coenzyme Q10 100 MG TABS 1 tablet, Oral, 2 times daily   gabapentin (NEURONTIN) 300 MG capsule Take 3 capsules twice a day   Glucosamine-MSM-Hyaluronic Acd (JOINT HEALTH PO) 1 capsule, Oral, Daily   loratadine (CLARITIN) 10 mg, Oral, Daily at bedtime   metoprolol succinate (TOPROL-XL) 75 mg, Oral, Daily, Take with or immediately following a meal   Multiple Vitamins-Minerals (OCUVITE PRESERVISION PO) 1 capsule, Oral, 2 times daily   nitroGLYCERIN (NITROSTAT) 0.4 mg, Sublingual, Every 5 min x3 PRN   NON FORMULARY 1-2 packets, Oral, See admin instructions, Lypo-Spheric vitamin C gel packets- Mix 1-2 packets of gel into juice one to two times a day and drink    NON FORMULARY 10 mg, Oral, See admin instructions, Jarrow Formulas Lyco-Sorb (for Prostate & Cardiovascular Health) 10 mg capsules: Take  10 mg by mouth two times a day   Omega-3 Fatty Acids (FISH OIL PO) 1 capsule, Oral, 2 times daily   omeprazole (PRILOSEC) 20 mg, Oral, Daily before breakfast   OXcarbazepine (TRILEPTAL) 150 MG tablet Take 1/2 tablet in AM, 1 tablet in PM   rosuvastatin (CRESTOR) 20 mg, Oral, Daily   telmisartan (MICARDIS) 80 mg, Oral, Daily   Vitamin D, Cholecalciferol, 25 MCG (1000 UT) TABS 1 tablet, Oral, Daily       Objective:   Physical Exam BP 128/78   Pulse (!) 57   Temp 97.8 F (36.6 C) (Oral)   Resp 18   Ht 5\' 10"  (1.778 m)   Wt 179 lb 6 oz (81.4 kg)   SpO2 96%   BMI 25.74 kg/m  General:   Well developed, NAD, BMI noted. HEENT:  Normocephalic . Face symmetric,  atraumatic Lungs:  CTA B Normal respiratory effort, no intercostal retractions, no accessory muscle use. Heart: RRR,  no murmur.  Lower extremities: no pretibial edema bilaterally  Skin: Not pale. Not jaundice Neurologic:  alert & oriented X3.  Speech normal, gait appropriate for age and unassisted Psych--  Cognition and judgment appear intact.  Cooperative with normal attention span and concentration.  Behavior appropriate. No anxious or depressed appearing.      Assessment     Assessment Prediabetes HTN -Intolerant to low-dose clonidine - Intolerant to high-dose amlodipine - Intolerant to HCTZ dizziness Hyperlipidemia, (intolerant to simva-pravachol: shoulder pain) Anxiety- rarely takes xanax Neuro  -- Trigeminal neuralgia  dx 2015, no w/u, sx resurface x1 after temporary d/c of meds  --saw neuro again 02/2017, labs (-), had a NCS, Rx gaba -- strokes CV: --Mitral valve annuloplasty 2006-- needs SBE prophylaxis -- SOB 04-2016, [facility] at [facility], had a ECHO Nl fx MV, cath: no CAD --Cryptogenic stroke 09/12/2016, had slurred speech. 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 [name] Colon cancer partial colectomy 1990, colonoscopy 08-2007 negative, Cscope again 09-2012 normal, 5 years  SCC . MOHS 06/2017  PLAN: HTN: Since the last visit, amlodipine dose decreased, edema improved. Saw cardiology, BP was labile, change losartan to telmisartan. Ambulatory BPs are less variable but he still has high numbers in the afternoon.  Patient is still very concerned about the issue. He self stopped clonidine as it makes him dizzy. Plan: Continue metoprolol 75 mg daily, take an extra 25 mg only if BP is elevated in the afternoon, threshold to take it is SBP more than  150. Continue amlodipine 5 mg qd,continue telmisartan 80 mg. BMP CBC Follow-up 3 to 4 months

## 2022-05-10 NOTE — Patient Instructions (Addendum)
Check the  blood pressure regularly BP GOAL is between 110/65 and  150/85. If it is consistently higher or lower, let me know  Continue the same medications. If you take your blood pressure at night and is more than 150, okay to take a extra  Metoprolol XL 25 mg      GO TO THE LAB : Get the blood work     [place], [place] back for checkup in 3 to 4 months   Vaccines I recommend:  Covid booster RSV vaccine  Please bring [place] a copy of your Healthcare Power of Attorney for your chart.

## 2022-05-10 NOTE — Assessment & Plan Note (Signed)
HTN: Since the last visit, amlodipine dose decreased, edema improved. Saw cardiology, BP was labile, change losartan to telmisartan. Ambulatory BPs are less variable but he still has high numbers in the afternoon.  Patient is still very concerned about the issue. He self stopped clonidine as it makes him dizzy. Plan: Continue metoprolol 75 mg daily, take an extra 25 mg only if BP is elevated in the afternoon, threshold to take it is SBP more than 150. Continue amlodipine 5 mg qd,continue telmisartan 80 mg. BMP CBC Follow-up 3 to 4 months

## 2022-05-11 LAB — BASIC METABOLIC PANEL
BUN: 17 mg/dL (ref 6–23)
CO2: 31 mEq/L (ref 19–32)
Calcium: 9.3 mg/dL (ref 8.4–10.5)
Chloride: 99 mEq/L (ref 96–112)
Creatinine, Ser: 1 mg/dL (ref 0.40–1.50)
GFR: 69.5 mL/min (ref >60.00)
Glucose, Bld: 92 mg/dL (ref 70–99)
Potassium: 4.3 mEq/L (ref 3.5–5.1)
Sodium: 137 mEq/L (ref 135–145)

## 2022-05-11 LAB — CBC WITH DIFFERENTIAL/PLATELET
Basophils Absolute: 0 10*3/uL (ref 0.0–0.1)
Basophils Relative: 0.6 % (ref 0.0–3.0)
Eosinophils Absolute: 0.2 10*3/uL (ref 0.0–0.7)
Eosinophils Relative: 2.9 % (ref 0.0–5.0)
HCT: 41 % (ref 39.0–52.0)
Hemoglobin: 14 g/dL (ref 13.0–17.0)
Lymphocytes Relative: 32.4 % (ref 12.0–46.0)
Lymphs Abs: 2 10*3/uL (ref 0.7–4.0)
MCHC: 34.2 g/dL (ref 30.0–36.0)
MCV: 83.1 fl (ref 78.0–100.0)
Monocytes Absolute: 0.3 10*3/uL (ref 0.1–1.0)
Monocytes Relative: 5.4 % (ref 3.0–12.0)
Neutro Abs: 3.6 10*3/uL (ref 1.4–7.7)
Neutrophils Relative %: 58.7 % (ref 43.0–77.0)
Platelets: 207 10*3/uL (ref 150.0–400.0)
RBC: 4.93 Mil/uL (ref 4.22–5.81)
RDW: 14.3 % (ref 11.5–15.5)
WBC: 6.1 10*3/uL (ref 4.0–10.5)

## 2022-05-18 ENCOUNTER — Other Ambulatory Visit: Payer: Self-pay | Admitting: Internal Medicine

## 2022-05-18 DIAGNOSIS — I480 Paroxysmal atrial fibrillation: Secondary | ICD-10-CM

## 2022-05-18 NOTE — Telephone Encounter (Signed)
Pt last saw Dr Debara Pickett 04/21/22, last labs 05/10/22 Creat 1.0, age 82, weight 81.4kg, based on specified criteria pt is on appropriate dosage of Eliquis 5mg  BID for afib.  Will refill rx.

## 2022-05-22 DIAGNOSIS — R0981 Nasal congestion: Secondary | ICD-10-CM | POA: Diagnosis not present

## 2022-05-22 DIAGNOSIS — J029 Acute pharyngitis, unspecified: Secondary | ICD-10-CM | POA: Diagnosis not present

## 2022-05-22 DIAGNOSIS — R051 Acute cough: Secondary | ICD-10-CM | POA: Diagnosis not present

## 2022-06-15 ENCOUNTER — Ambulatory Visit (INDEPENDENT_AMBULATORY_CARE_PROVIDER_SITE_OTHER): Payer: PPO | Admitting: Family Medicine

## 2022-06-15 ENCOUNTER — Encounter: Payer: Self-pay | Admitting: Family Medicine

## 2022-06-15 VITALS — BP 120/80 | HR 53 | Temp 98.4°F | Ht 70.0 in | Wt 176.2 lb

## 2022-06-15 DIAGNOSIS — R059 Cough, unspecified: Secondary | ICD-10-CM | POA: Diagnosis not present

## 2022-06-15 MED ORDER — MONTELUKAST SODIUM 10 MG PO TABS: 10.0000 mg | ORAL_TABLET | Freq: Every day | ORAL | 3 refills | Status: DC

## 2022-06-15 NOTE — Progress Notes (Signed)
Chief Complaint  Patient presents with   head cold for about a month, still having a little cough.    Kevin Bass here for [name] complaints.  Duration: 1 month; this happens around the same time every year Associated symptoms: coughing- yellow-green phlegm but this is chronic Denies: sinus congestion, sinus pain, rhinorrhea, itchy watery eyes, ear pain, ear drainage, sore throat, wheezing, shortness of breath, myalgia, and fevers Treatment to date: INCS, Claritin Sick contacts: No  Past Medical History:  Diagnosis Date   Allergy    Anxiety    Arthritis    hands   Atrial fibrillation    Colon cancer    s/p partial colectomy 1990, Cscope neg 7-09, next 2014   DISORDER, MITRAL VALVE    MV recontruction at [facility] 2006----still needs ABX for SBE prophylaxis    Elevated PSA    prostate nodule, Dr Laverle Patter   GERD (gastroesophageal reflux disease)    Heart murmur    no problems since surgery   HYPERGLYCEMIA, BORDERLINE 10/16/2008   HYPERLIPIDEMIA 06/15/2006   diet controlled   Hypertension    HYPERTROPHY PROSTATE W/O UR OBST & OTH LUTS 10/16/2008   Neuropathy    Right groin pain    Dr. Andrey Campanile   SCC (squamous cell carcinoma) 06/2017   s/p Mohs---Dr Volanda Napoleon   Stroke    Wears partial dentures    lower partial    Objective BP 120/80 (BP Location: Left Arm, Patient Position: Sitting, Cuff Size: Normal)   Pulse (!) 53   Temp 98.4 F (36.9 C) (Oral)   Ht  (1.778 m)   Wt 176 lb 4 oz (79.9 kg)   SpO2 99%   BMI 25.29 kg/m  General: Awake, alert, appears stated age HEENT: AT, [place], ears patent b/l and TM's neg, nares patent w/o discharge, pharynx pink and without exudates, MMM Neck: No masses or asymmetry Heart: reg rhythm, bradycardic Lungs: CTAB, no accessory muscle use Psych: Age appropriate judgment and insight, normal mood and affect  Cough, unspecified type - Plan: montelukast (SINGULAIR) 10 MG tablet  Cont INCS and Claritin. Add Singulair. Would consider CXR if no  improvement. Continue to push fluids, practice good hand hygiene, cover mouth when coughing. F/u in 3 weeks w Dr. Drue Novel to reck coughing.  Pt voiced understanding and agreement to the plan.  Jilda Roche [name], DO 06/15/22 2:21 PM

## 2022-06-15 NOTE — Patient Instructions (Signed)
Continue the Flonase and Claritin.  Let us know if you need anything.

## 2022-06-18 DIAGNOSIS — H43812 Vitreous degeneration, left eye: Secondary | ICD-10-CM | POA: Diagnosis not present

## 2022-06-18 DIAGNOSIS — H2513 Age-related nuclear cataract, bilateral: Secondary | ICD-10-CM | POA: Diagnosis not present

## 2022-06-18 DIAGNOSIS — G500 Trigeminal neuralgia: Secondary | ICD-10-CM | POA: Diagnosis not present

## 2022-06-18 DIAGNOSIS — H43821 Vitreomacular adhesion, right eye: Secondary | ICD-10-CM | POA: Diagnosis not present

## 2022-06-18 DIAGNOSIS — H25043 Posterior subcapsular polar age-related cataract, bilateral: Secondary | ICD-10-CM | POA: Diagnosis not present

## 2022-06-18 DIAGNOSIS — H5203 Hypermetropia, bilateral: Secondary | ICD-10-CM | POA: Diagnosis not present

## 2022-06-18 DIAGNOSIS — H524 Presbyopia: Secondary | ICD-10-CM | POA: Diagnosis not present

## 2022-06-18 DIAGNOSIS — H52202 Unspecified astigmatism, left eye: Secondary | ICD-10-CM | POA: Diagnosis not present

## 2022-06-18 DIAGNOSIS — H35362 Drusen (degenerative) of macula, left eye: Secondary | ICD-10-CM | POA: Diagnosis not present

## 2022-07-16 ENCOUNTER — Ambulatory Visit (INDEPENDENT_AMBULATORY_CARE_PROVIDER_SITE_OTHER): Payer: PPO | Admitting: Internal Medicine

## 2022-07-16 ENCOUNTER — Encounter: Payer: Self-pay | Admitting: Internal Medicine

## 2022-07-16 VITALS — BP 116/64 | HR 60 | Temp 97.6°F | Resp 16 | Ht 70.0 in | Wt 177.4 lb

## 2022-07-16 DIAGNOSIS — R052 Subacute cough: Secondary | ICD-10-CM | POA: Diagnosis not present

## 2022-07-16 DIAGNOSIS — I10 Essential (primary) hypertension: Secondary | ICD-10-CM

## 2022-07-16 NOTE — Progress Notes (Unsigned)
Subjective:    Patient ID: Kevin Bass, male    DOB: Jun 27, 1940, 83 y.o.   MRN: [id number]  DOS:  07/16/2022 Type of visit - description: f/u  Last month was seen by one of my partners, had cough, fortunately he is doing well.  Feels fully recuperated.  Denies chest pain or difficulty breathing.  BP today is very good.  Review of Systems See above   Past Medical History:  Diagnosis Date   Allergy    Anxiety    Arthritis    hands   Atrial fibrillation ([facility])    Colon cancer ([facility])    s/p partial colectomy 1990, Cscope neg 7-09, next 2014   DISORDER, MITRAL VALVE    MV recontruction at [facility] 2006----still needs ABX for SBE prophylaxis    Elevated PSA    prostate nodule, Dr Laverle Patter   GERD (gastroesophageal reflux disease)    Heart murmur    no problems since surgery   HYPERGLYCEMIA, BORDERLINE 10/16/2008   HYPERLIPIDEMIA 06/15/2006   diet controlled   Hypertension    HYPERTROPHY PROSTATE W/O UR OBST & OTH LUTS 10/16/2008   Neuropathy    Right groin pain    Dr. Andrey Campanile   SCC (squamous cell carcinoma) 06/2017   s/p Mohs---Dr Volanda Napoleon   Stroke [facility])    Wears partial dentures    lower partial    Past Surgical History:  Procedure Laterality Date   COLECTOMY  1990   HERNIA REPAIR Left 2014   [facility] rep w/mesh- March 2014   LOOP RECORDER INSERTION N/A 12/01/2016   Procedure: LOOP RECORDER INSERTION;  Surgeon: Marinus Maw, MD;  Location: [facility] INVASIVE CV LAB;  Service: Cardiovascular;  Laterality: N/A;   MITRAL VALVE ANNULOPLASTY     [facility] 2006   TEE WITHOUT CARDIOVERSION N/A 10/05/2016   Procedure: TRANSESOPHAGEAL ECHOCARDIOGRAM (TEE);  Surgeon: Chrystie Nose, MD;  Location: [facility] ENDOSCOPY;  Service: Cardiovascular;  Laterality: N/A;    Current Outpatient Medications  Medication Instructions   acetaminophen (TYLENOL) 1,300 mg, Oral, Every 8 hours PRN   ALPRAZolam (XANAX) 0.25 MG tablet TAKE 1 TABLET(0.25 MG) BY MOUTH TWICE DAILY AS NEEDED FOR ANXIETY   amLODipine  (NORVASC) 5 mg, Oral, Daily at bedtime   Arginine 1,000 mg, Oral, Daily   Baclofen 5 MG TABS 1 tablet, Oral, 3 times daily PRN, for pain   Coenzyme Q10 100 MG TABS 1 tablet, Oral, 2 times daily   Eliquis 5 mg, Oral, 2 times daily   gabapentin (NEURONTIN) 300 MG capsule Take 3 capsules twice a day   Glucosamine-MSM-Hyaluronic Acd (JOINT HEALTH PO) 1 capsule, Oral, Daily   loratadine (CLARITIN) 10 mg, Oral, Daily at bedtime   metoprolol succinate (TOPROL-XL) 75 mg, Oral, Daily, Take with or immediately following a meal   Multiple Vitamins-Minerals (OCUVITE PRESERVISION PO) 1 capsule, Oral, 2 times daily   nitroGLYCERIN (NITROSTAT) 0.4 mg, Sublingual, Every 5 min x3 PRN   NON FORMULARY 1-2 packets, Oral, See admin instructions, Lypo-Spheric vitamin C gel packets- Mix 1-2 packets of gel into juice one to two times a day and drink    NON FORMULARY 10 mg, Oral, See admin instructions, [employer] Formulas Lyco-Sorb (for Prostate & Cardiovascular Health) 10 mg capsules: Take 10 mg by mouth two times a day   Omega-3 Fatty Acids (FISH OIL PO) 1 capsule, Oral, 2 times daily   omeprazole (PRILOSEC) 20 mg, Oral, Daily before breakfast   OXcarbazepine (TRILEPTAL) 150 MG tablet Take 1/2 tablet in AM, 1  tablet in PM   rosuvastatin (CRESTOR) 20 mg, Oral, Daily   telmisartan (MICARDIS) 80 mg, Oral, Daily   Vitamin D, Cholecalciferol, 25 MCG (1000 UT) TABS 1 tablet, Oral, Daily       Objective:   Physical Exam BP 116/64   Pulse 60   Temp 97.6 F (36.4 C) (Oral)   Resp 16   Ht 5\' 10"  (1.778 m)   Wt 177 lb 6 oz (80.5 kg)   SpO2 97%   BMI 25.45 kg/m  General:   Well developed, NAD, BMI noted. HEENT:  Normocephalic . Face symmetric, atraumatic Lungs:  CTA B Normal respiratory effort, no intercostal retractions, no accessory muscle use. Heart: RRR,  no murmur.  Lower extremities: no pretibial edema bilaterally  Skin: Not pale. Not jaundice Neurologic:  alert & oriented X3.  Speech normal, gait  appropriate for age and unassisted Psych--  Cognition and judgment appear intact.  Cooperative with normal attention span and concentration.  Behavior appropriate. No anxious or depressed appearing.      Assessment    Assessment Prediabetes HTN -Intolerant to low-dose clonidine,high-dose amlodipine,HCTZ dizziness Hyperlipidemia, (intolerant to simva-pravachol: shoulder pain) Anxiety- rarely takes xanax Neuro  -- Trigeminal neuralgia  dx 2015, no w/u, sx resurface x1 after temporary d/c of meds  --saw neuro again 02/2017, labs (-), had a NCS, Rx gaba CV: --Mitral valve annuloplasty 2006-- needs SBE prophylaxis -- SOB 04-2016, seen at [facility], ECHO Nl fx MV, cath: no CAD --Cryptogenic stroke 09/12/2016,  started plavix -- new R PICA stroke,  11-30-16, implanted loop recorder placed --A-FIB noted on ILR ~ 12-21-2016, changed to eliquis  -- 11/2017:   L Hippocampus CVA with acute L posterior cerabral artery Elevated PSA and BPH-- Dr. Laverle Patter Colon cancer partial colectomy 1990, SCC . MOHS 06/2017  PLAN: Cough: Seen by one of my partners last month, symptoms resolved HTN: BP today is very good on amlodipine, metoprolol, Micardis.  No edema on exam. Labs reviewed: No need for blood work today RTC 4 months

## 2022-07-16 NOTE — Patient Instructions (Addendum)
Vaccines I recommend: Covid booster RSV vaccine   Please bring [place] a copy of your Healthcare Power of Attorney for your chart.     GO TO THE FRONT DESK, PLEASE SCHEDULE YOUR APPOINTMENTS Come back for   a check up in 4 months

## 2022-07-17 NOTE — Assessment & Plan Note (Signed)
Cough: Seen by one of my partners last month, symptoms resolved HTN: BP today is very good on amlodipine, metoprolol, Micardis.  No edema on exam. Labs reviewed: No need for blood work today RTC 4 months

## 2022-07-26 ENCOUNTER — Ambulatory Visit: Payer: PPO | Admitting: Neurology

## 2022-07-26 ENCOUNTER — Encounter: Payer: Self-pay | Admitting: Neurology

## 2022-07-26 VITALS — BP 118/62 | HR 58 | Ht 70.0 in | Wt 177.4 lb

## 2022-07-26 DIAGNOSIS — G629 Polyneuropathy, unspecified: Secondary | ICD-10-CM

## 2022-07-26 DIAGNOSIS — G500 Trigeminal neuralgia: Secondary | ICD-10-CM

## 2022-07-26 DIAGNOSIS — I639 Cerebral infarction, unspecified: Secondary | ICD-10-CM

## 2022-07-26 MED ORDER — BACLOFEN 5 MG PO TABS: ORAL_TABLET | ORAL | 3 refills | Status: DC

## 2022-07-26 MED ORDER — GABAPENTIN 300 MG PO CAPS: ORAL_CAPSULE | ORAL | 3 refills | Status: DC

## 2022-07-26 MED ORDER — OXCARBAZEPINE 150 MG PO TABS: ORAL_TABLET | ORAL | 3 refills | Status: DC

## 2022-07-26 NOTE — Progress Notes (Signed)
NEUROLOGY FOLLOW UP OFFICE NOTE  Kevin Bass [id number] [birth year]  HISTORY OF PRESENT ILLNESS: I had the pleasure of seeing Kevin Bass in follow-up in the neurology clinic on 07/26/2022. He is alone in the office today. The patient was last seen 6 months ago for trigeminal neuralgia, neuropathy, and history of recurrent strokes. He reports symptoms overall stable since last visit. Trigeminal neuralgia occurs sporadically, a couple of times he has had pain over the right eye. He is on Gabapentin 300mg  2 caps in AM, 3 caps in PM, Oxcarbazepine 150mg  1/2 tablet in AM, 1 tablet in PM, and Baclofen 5mg  TID without side effects. When he has pain, it is at 3 to 4 over 10 in intensity. The neuropathy on his right side is constant, worse in the evenings. It does not affect sleep. He denies any headaches, dizziness, vision changes, no falls.   History on Initial Assessment 10/01/2016: This is a pleasant 82 yo RH man with a history of colon cancer, hyperlipidemia, previous mitral valve repair in his usual state of health until 09/11/16 after taking a walk, he saw his brother-in-law and when he started noticing word-finding difficulties. His wife reported that his speech was not necessarily slurred but he was having trouble finding his words. When symptoms were unchanged the next day, he decided to go to the ER. He was still noted to have mild word-finding difficulties. There was no focal numbness/tingling/weakness. He denied any headaches, dizziness, diplopia, dysarthria/dysphagia. I personally reviewed MRI brain without contrast which showed an acute infarct in the left posterior insula and parietal operculum. MRA head and carotid dopplers did not show any significant stenosis. Echocardiogram showed EF 60-65%, mild LVH, prior MV repair without significant stenosis or regurgitation, normal LA size. He was switched from aspirin to Plavix. LDL was 124, HbA1c was 5.6. He was unable to get a TEE in the  hospital, and has seen Dr. Rennis Golden and agreed to having the procedure done on 8/14 followed by loop recorder. He denies any palpitations, chest pain, shortness of breath. No neck/back pain, bowel/bladder dysfunction. No falls. He denies any side effect on Plavix. He was started on Crestor yesterday. He feels he is 95% or more back to baseline, he denies any confusion or focal symptoms. He reports a history of facial pain on the right upper lip radiating up his cheek with good response to gabapentin. He takes 300mg  qhs with no side effects.   Update 12/07/16: He had no residual deficits from prior stroke, then on 11/29/16 he had sudden onset vertigo that made him fall backward. This lasted for 15 seconds and was followed by a mild headache. He called triage and was instructed to go seek medical attention the next day. He went to the ER and was found to have a right PICA territory stroke. He was evaluated by Neurology with no focal deficits seen. He reports he was taking aspirin and Plavix when the stroke occurred. He had an extensive workup for the prior stroke, he had a TEE in August 2018 which showed normal left atrium, no thrombus seen. He had a 48-hour monitor which was unremarkable. In the hospital, he had a CTA of the head and neck which did not show any occlusion or stenosis. Repeat TTE showed EF of 55-60%, left atrium mildly dilated. He had a CT chest/abdomen/pelvis which did not show any evidence of malignancy. On hospital discharge, he was instructed to continue aspirin and Plavix and stop his antihypertensives. He had a loop recorder  placed.   Update 12/06/2017: He underwent right median nerve and ulnar nerve decompression on 11/28/17. He had stopped the Eliquis for 2 days for the procedure, and restarted Eliquis on 11/29/17. On 12/02/17, he took a nap and woke up with right arm weakness and numbness. He could not lift his right arm up. After 20 minutes, he started getting more feeling in his arm. Per  records, he stood up and noticed his gait was off. The weakness improved after several minutes, but he continued to feel off balance. He went to the ER where SBP was elevated at 170. NIH 0 in the hospital. Records were reviewed, he had an MRI brain without contrast showing an acute infarct in the left hippocampus, occlusion of the left PCA which could be acute. There were chronic infarcts in the right cerebellum and left parietal operculum. He had an echo which showed an EF of 55-60%, left atrium normal, right atrium moderately dilated. No significant stenosis on carotid dopplers. LDL in August 2019 was 120, he was advised to increase Crestor to 40mg  daily. He was having muscle pains and stopped statin for 5 months, he was restarted Crestor with Zetia last August.  He was discharged home 2 days ago and reports that this is the worst of his strokes. It has affected his memory more than anything. He has also noticed vision changes on the right visual field, he describes blurred/double vision reading on his right side. He feels his strength is back but his balance is still off. He also has left foot pain. He has been dealing with his wife and sister's recent deaths.    Diagnostic Data: Neuropathy labs were normal (TSH, B12, ESR, folate, SPEP/IFE). He had an EMG/NCV of the right UE and LE which showed right ulnar neuropathy with slowing across the elbow, axon loss and demyelinating in type, mild to moderate in degree; right median neuropathy at or distal to the wrist, consistent with carpal tunnel, mild in degree, and probable L5 radiculopathy, mild. No evidence of a large fiber sensorimotor polyneuropathy.     PAST MEDICAL HISTORY: Past Medical History:  Diagnosis Date   Allergy    Anxiety    Arthritis    hands   Atrial fibrillation (HCC)    Colon cancer (HCC)    s/p partial colectomy 1990, Cscope neg 7-09, next 2014   DISORDER, MITRAL VALVE    MV recontruction at [facility] 2006----still needs ABX for SBE  prophylaxis    Elevated PSA    prostate nodule, Dr Laverle Patter   GERD (gastroesophageal reflux disease)    Heart murmur    no problems since surgery   HYPERGLYCEMIA, BORDERLINE 10/16/2008   HYPERLIPIDEMIA 06/15/2006   diet controlled   Hypertension    HYPERTROPHY PROSTATE W/O UR OBST & OTH LUTS 10/16/2008   Neuropathy    Right groin pain    Dr. Andrey Campanile   SCC (squamous cell carcinoma) 06/2017   s/p Mohs---Dr Volanda Napoleon   Stroke [facility])    Wears partial dentures    lower partial    MEDICATIONS: Current Outpatient Medications on File Prior to Visit  Medication Sig Dispense Refill   acetaminophen (TYLENOL) 650 MG CR tablet Take 1,300 mg by mouth every 8 (eight) hours as needed for pain.     ALPRAZolam (XANAX) 0.25 MG tablet TAKE 1 TABLET(0.25 MG) BY MOUTH TWICE DAILY AS NEEDED FOR ANXIETY 60 tablet 1   amLODipine (NORVASC) 5 MG tablet Take 1 tablet (5 mg total) by mouth at bedtime.  Arginine 500 MG CAPS Take 1,000 mg by mouth daily.      Baclofen 5 MG TABS Take 1 tablet by mouth 3 (three) times daily as needed. for pain 90 tablet 6   Coenzyme Q10 100 MG TABS Take 1 tablet by mouth in the morning and at bedtime.     ELIQUIS 5 MG TABS tablet TAKE 1 TABLET BY MOUTH TWICE DAILY 180 tablet 1   gabapentin (NEURONTIN) 300 MG capsule Take 3 capsules twice a day (Patient taking differently: Take 300 mg by mouth. Take 2 in am and 3 at night) 540 capsule 3   Glucosamine-MSM-Hyaluronic Acd (JOINT HEALTH PO) Take 1 capsule by mouth daily.      loratadine (CLARITIN) 10 MG tablet Take 10 mg by mouth at bedtime.     metoprolol succinate (TOPROL-XL) 25 MG 24 hr tablet Take 3 tablets (75 mg total) by mouth daily. Take with or immediately following a meal 270 tablet 1   Multiple Vitamins-Minerals (OCUVITE PRESERVISION PO) Take 1 capsule by mouth 2 (two) times daily.      nitroGLYCERIN (NITROSTAT) 0.4 MG SL tablet Place 1 tablet (0.4 mg total) under the tongue every 5 (five) minutes x 3 doses as needed for chest  pain. (Patient not taking: Reported on 07/16/2022) 25 tablet 3   NON FORMULARY Take 1-2 packets by mouth See admin instructions. Lypo-Spheric vitamin C gel packets- Mix 1-2 packets of gel into juice one to two times a day and drink     NON FORMULARY Take 10 mg by mouth See admin instructions. J[employer] Formulas Lyco-Sorb (for Prostate & Cardiovascular Health) 10 mg capsules: Take 10 mg by mouth two times a day     Omega-3 Fatty Acids (FISH OIL PO) Take 1 capsule by mouth in the morning and at bedtime.     omeprazole (PRILOSEC) 20 MG capsule Take 1 capsule (20 mg total) by mouth daily before breakfast. 90 capsule 1   OXcarbazepine (TRILEPTAL) 150 MG tablet Take 1/2 tablet in AM, 1 tablet in PM 135 tablet 3   rosuvastatin (CRESTOR) 20 MG tablet Take 1 tablet (20 mg total) by mouth daily. 90 tablet 3   telmisartan (MICARDIS) 80 MG tablet Take 1 tablet (80 mg total) by mouth daily. 90 tablet 3   Vitamin D, Cholecalciferol, 25 MCG (1000 UT) TABS Take 1 tablet by mouth daily.     No current facility-administered medications on file prior to visit.    ALLERGIES: Allergies  Allergen Reactions   Prednisolone Acetate     Increased intraocular pressure   Statins Other (See Comments)    Myalgias (intolerance) Simvastatin, atorvastatin, pravastatin    FAMILY HISTORY: Family History  Problem Relation Age of Onset   Colon cancer Other        M side (cousins)   Diabetes Neg Hx    Stroke Neg Hx    CAD Neg Hx    Prostate cancer Neg Hx    Rectal cancer Neg Hx    Esophageal cancer Neg Hx     SOCIAL HISTORY: Social History   Socioeconomic History   Marital status: Widowed    Spouse name: suzanne   Number of children: 2   Years of education: college   Highest education level: Not on file  Occupational History   Occupation: retired   Tobacco Use   Smoking status: Former    Packs/day: 0.50    Years: 20.00    Additional pack years: 0.00    Total pack years:  10.00    Types: Cigarettes    Quit  date: 02/23/1983    Years since quitting: 39.4   Smokeless tobacco: Never  Vaping Use   Vaping Use: Never used  Substance and Sexual Activity   Alcohol use: Yes    Alcohol/week: 7.0 standard drinks of alcohol    Types: 7 Glasses of wine per week   Drug use: No   Sexual activity: Not Currently  Other Topics Concern   Not on file  Social History Narrative   From [place], lost wife 08/2017 to cancer   Lives by himself    Daughter in [place], 1 granddaughter   Son in [place], 2 grandchildren   Right handed      Social Determinants of Health   Financial Resource Strain: Medium Risk (10/06/2021)   Overall Financial Resource Strain (CARDIA)    Difficulty of Paying Living Expenses: Somewhat hard  Food Insecurity: No Food Insecurity (12/15/2021)   Hunger Vital Sign    Worried About Running Out of Food in the Last Year: Never true    Ran Out of Food in the Last Year: Never true  Transportation Needs: No Transportation Needs (12/15/2021)   PRAPARE - Administrator, Civil Service (Medical): No    Lack of Transportation (Non-Medical): No  Physical Activity: Insufficiently Active (10/06/2021)   Exercise Vital Sign    Days of Exercise per Week: 2 days    Minutes of Exercise per Session: 30 min  Stress: No Stress Concern Present (12/15/2021)   [employer] of Occupational Health - Occupational Stress Questionnaire    Feeling of Stress : Not at all  Social Connections: Moderately Isolated (12/15/2021)   Social Connection and Isolation Panel [NHANES]    Frequency of Communication with Friends and Family: More than three times a week    Frequency of Social Gatherings with Friends and Family: Three times a week    Attends Religious Services: 1 to 4 times per year    Active Member of Clubs or Organizations: No    Attends Banker Meetings: Never    Marital Status: Widowed  Intimate Partner Violence: Not At Risk (12/15/2021)   Humiliation, Afraid, Rape, and Kick  questionnaire    Fear of Current or Ex-Partner: No    Emotionally Abused: No    Physically Abused: No    Sexually Abused: No     PHYSICAL EXAM: Vitals:   07/26/22 1528  BP: 118/62  Pulse: (!) 58  SpO2: 99%   General: No acute distress Head:  Normocephalic/atraumatic Skin/Extremities: No rash, no edema Neurological Exam: alert and awake. No aphasia or dysarthria. Fund of knowledge is appropriate. Attention and concentration are normal.   Cranial nerves: Pupils equal, round. Extraocular movements intact with no nystagmus. Visual fields full. Facial sensation intact to light touch. No facial asymmetry.  Motor: Bulk and tone normal, muscle strength 5/5 throughout with no pronator drift.   Finger to nose testing intact.  Gait narrow-based and steady, no ataxia.    IMPRESSION: This is a pleasant 82 yo RH man with a history of colon cancer, hyperlipidemia, previous mitral valve repair with recurrent strokes (x3) secondary to atrial fibrillation, neuropathy, trigeminal neuralgia. Trigeminal neuralgia stable on oxcarbazepine 150mg  1/2 tab in AM, 1 tab in PM, Gabapentin 300mg  2 caps in AM, 3 caps in PM, and Baclofen 5mg  TID. He reports neuropathy is constant, worse at night. We discussed option to increase evening dose of Gabapentin but he would like to stay the  course since he is on multiple medications. Refills sent. Continue Eliquis and control of vascular risk factors for secondary stroke prevention. Follow-up in 8 months, call for any changes.     Thank you for allowing me to participate in his care.  Please do not hesitate to call for any questions or concerns.    Patrcia Dolly, M.D.   CC: Dr. Drue Novel

## 2022-07-26 NOTE — Patient Instructions (Addendum)
Always good to see you. Continue all your medications. Follow-up in 8 months, call for any changes.

## 2022-08-02 ENCOUNTER — Telehealth: Payer: Self-pay | Admitting: Internal Medicine

## 2022-08-02 NOTE — Telephone Encounter (Signed)
Will need to verify that patient has spent $600 out of pocket for 2024. Will have Med Assist team check with his pharmacy. If he has met out of pocket spend, then will start application process.

## 2022-08-02 NOTE — Telephone Encounter (Signed)
Pt called stating that he needed to have his paperwork started for financial assistance with Eliquis for this year.

## 2022-08-06 NOTE — Telephone Encounter (Signed)
Requested out of pocket spend report for current to date for 2024 from [facility]. Patient will pick up and either drop off or mail to our office.  Will forward to Med Assist Department to send patient application for Eliquis medication assistance program.

## 2022-08-10 ENCOUNTER — Other Ambulatory Visit (HOSPITAL_COMMUNITY): Payer: Self-pay

## 2022-08-10 NOTE — Telephone Encounter (Signed)
Mailed BMS application to pts home, included instructions on how to apply for LIS.

## 2022-09-08 ENCOUNTER — Telehealth: Payer: PPO

## 2022-09-08 ENCOUNTER — Ambulatory Visit: Payer: PPO | Admitting: Pharmacist

## 2022-09-08 DIAGNOSIS — I480 Paroxysmal atrial fibrillation: Secondary | ICD-10-CM

## 2022-09-08 DIAGNOSIS — I10 Essential (primary) hypertension: Secondary | ICD-10-CM

## 2022-09-08 DIAGNOSIS — E782 Mixed hyperlipidemia: Secondary | ICD-10-CM

## 2022-09-08 DIAGNOSIS — M791 Myalgia, unspecified site: Secondary | ICD-10-CM

## 2022-09-08 MED ORDER — PRAVASTATIN SODIUM 20 MG PO TABS: 20.0000 mg | ORAL_TABLET | Freq: Every evening | ORAL | 2 refills | Status: DC

## 2022-09-08 NOTE — Progress Notes (Signed)
09/08/2022 Name: Kevin Bass MRN: [id number] DOB: [birth year]  Chief Complaint  Patient presents with   Hypertension   Medication Management    Kevin Bass is a 82 y.o. year old male who presented for a telephone visit.   They were referred to the pharmacist by their PCP for assistance in managing hypertension and complex medication management.   Subjective:   Medication Access/Adherence  Current Pharmacy:  [id number] [place], [place] - [address] AT [address] [place] [address] Phone: [phone] Fax: [phone]  [facility] [address] [place] - [address] [place] [address] Phone: [phone] Fax: [phone]   Patient reports affordability concerns with their medications:  Not currently - but does reach coverage gap later in year and requires assistance with Eliquis Patient reports access/transportation concerns to their pharmacy: No  Patient reports adherence concerns with their medications:  Yes  - stopped rosuvastatin due to pain in foot and arm and muscle pain.   Hypertension:  Current medications: amlodipine 5mg  daily, telmisartan 80mg  daily, metoprolol ER 25mg  take 3 tablets = 75mg  daily and clonidine 0.1mg  - take 1 tablet at bedtime  Medications previously tried: losartan - variable blood pressure, amlodipine 10mg  - caused edema, clonidine  Patient has a validated, automated, upper arm home BP cuff Current blood pressure readings readings:   130 to 135 / 70's   He reports 1 time he had blood pressure of 170/92 over the last month. He took an extra dose of metoprolol (instructions from PCP are to take metoprolol 25mg  if SBP > 150)  Patient denies hypotensive s/sx including no dizziness, lightheadedness.  Patient denies hypertensive symptoms including no headache, chest pain, shortness of breath   Atrial Fibrillation:  Current medications: Rate Control:  metoprolol ER 75mg  daily  Anticoagulation Regimen: Eliquis 5mg  twice a day  Hyperlipidemia:  Prescribed to take rosuvastatin 20mg  daily but patient states he stopped due to muscle, foot and arm pain. He reports pain has improved since stopping rosuvastatin.  Previous statins tried - atorvastatin (dose unknown) simvastatin 20mg  and pravastatin 40mg ; patient reports muscle pain with all these statins. He was on simvastatin for 2 or 3 years before he notice myalgias.   Objective:  Lab Results  Component Value Date   HGBA1C 5.9 12/30/2021    Lab Results  Component Value Date   CREATININE 1.00 05/10/2022   BUN 17 05/10/2022   NA 137 05/10/2022   K 4.3 05/10/2022   CL 99 05/10/2022   CO2 31 05/10/2022    Lab Results  Component Value Date   CHOL 111 08/24/2021   HDL 41 08/24/2021   LDLCALC 51 08/24/2021   LDLDIRECT 149.1 08/30/2012   TRIG 111 08/24/2021   CHOLHDL 2.7 08/24/2021    Medications Reviewed Today     Reviewed by Henrene Pastor, RPH-CPP (Pharmacist) on 09/08/22 at 1327  Med List Status: <None>   Medication Order Taking? Sig Documenting Provider Last Dose Status Informant  acetaminophen (TYLENOL) 650 MG CR tablet [id number]  Take 1,300 mg by mouth every 8 (eight) hours as needed for pain. [provider]  Active Multiple Informants  ALPRAZolam (XANAX) 0.25 MG tablet [id number] Yes TAKE 1 TABLET(0.25 MG) BY MOUTH TWICE DAILY AS NEEDED FOR ANXIETY Wanda Plump, MD Taking Active            Med Note Shon Hale   Wed Apr 21, 2022 10:09 AM) PRN  amLODipine (NORVASC) 5  MG tablet [id number] Yes Take 1 tablet (5 mg total) by mouth at bedtime. Wanda Plump, MD Taking Active   Arginine 500 MG CAPS [id number]  Take 1,000 mg by mouth daily.  [provider]  Active Multiple Informants  Baclofen 5 MG TABS [id number] Yes Take 3 times a day Van Clines, MD Taking Active   Coenzyme Q10 100 MG TABS [id number] Yes Take 1 tablet by mouth in the morning and at bedtime.  [provider] Taking Active   ELIQUIS 5 MG TABS tablet [id number] Yes TAKE 1 TABLET BY MOUTH TWICE DAILY Hilty, Lisette Abu, MD Taking Active   gabapentin (NEURONTIN) 300 MG capsule [id number] Yes Take 2 capsules in AM and 3 capsules at night Van Clines, MD Taking Active   Glucosamine-MSM-Hyaluronic Acd (JOINT HEALTH PO) [id number] Yes Take 1 capsule by mouth daily.  [provider] Taking Active Multiple Informants  loratadine (CLARITIN) 10 MG tablet [id number] Yes Take 10 mg by mouth at bedtime. [provider] Taking Active Multiple Informants           Med Note Terrall Laity Sep 12, 2016 11:40 AM)    metoprolol succinate (TOPROL-XL) 25 MG 24 hr tablet [id number] Yes Take 3 tablets (75 mg total) by mouth daily. Take with or immediately following a meal Wanda Plump, MD Taking Active   Multiple Vitamins-Minerals (OCUVITE PRESERVISION PO) [id number] Yes Take 1 capsule by mouth 2 (two) times daily.  [provider] Taking Active Multiple Informants  nitroGLYCERIN (NITROSTAT) 0.4 MG SL tablet [id number]  Place 1 tablet (0.4 mg total) under the tongue every 5 (five) minutes x 3 doses as needed for chest pain. Wanda Plump, MD  Active            Med Note (CANTER, Paula Libra   Fri Mar 19, 2022 10:18 AM) PRN  Clent Demark [id number] Yes Take 1-2 packets by mouth See admin instructions. Lypo-Spheric vitamin C gel packets- Mix 1-2 packets of gel into juice one to two times a day and drink [provider] Taking Active Multiple Informants  NON FORMULARY [id number] Yes Take 10 mg by mouth See admin instructions. [employer] Formulas Lyco-Sorb (for Prostate & Cardiovascular Health) 10 mg capsules: Take 10 mg by mouth two times a day [provider] Taking Active Multiple Informants  Omega-3 Fatty Acids (FISH OIL PO) [id number] Yes Take 1 capsule by mouth in the morning and at bedtime. [provider] Taking Active Multiple Informants  omeprazole  (PRILOSEC) 20 MG capsule [id number] Yes Take 1 capsule (20 mg total) by mouth daily before breakfast. Wanda Plump, MD Taking Active   OXcarbazepine (TRILEPTAL) 150 MG tablet [id number] Yes Take 1/2 tablet in AM, 1 tablet in PM Van Clines, MD Taking Active   rosuvastatin (CRESTOR) 20 MG tablet [id number] No Take 1 tablet (20 mg total) by mouth daily.  Patient not taking: Reported on 09/08/2022   Wanda Plump, MD Not Taking Active   telmisartan (MICARDIS) 80 MG tablet [id number] Yes Take 1 tablet (80 mg total) by mouth daily. Chrystie Nose, MD Taking Active   Vitamin D, Cholecalciferol, 25 MCG (1000 UT) TABS [id number] Yes Take 1 tablet by mouth daily. [provider] Taking Active               Assessment/Plan:   Hypertension: Currently controlled - Reviewed long term cardiovascular and renal outcomes of uncontrolled blood pressure - Reviewed appropriate blood pressure monitoring technique  and reviewed goal blood pressure. Recommended to check home blood pressure and heart rate once daily - Continue current mediations for blood pressure    Atrial Fibrillation: Currently controlled - Reviewed importance of adherence to anticoagulant for stroke prevention. - Recommend to continue Eliquis and metoprolol - patient has application for Eliquis medication assistance program but he has not reached the required 3% out of pocket spend yet (as of June 14th he has spent $556 for the year)   Hyperlipidemia: Last LDL at goal of < 70 but patient stopped a few months ago due to myalgias and foot / arm pain.  He is reluctant to try statin again but agrees to retry pravastatin - Sent in rx for pravastatin 20mg  daily in the evening.  - Reviewed low fat eating / Mediterranean type eating plan.  - Recheck lipids when he sees PCP in September. If unable to tolerate statin could try ezetimibe or PCSK9 agent   Follow Up Plan: September to check out of pocket cost and if he is tolerating  pravastatin   Henrene Pastor, PharmD Clinical Pharmacist [facility]

## 2022-09-13 ENCOUNTER — Ambulatory Visit: Payer: PPO | Admitting: Internal Medicine

## 2022-09-27 ENCOUNTER — Other Ambulatory Visit: Payer: Self-pay | Admitting: Internal Medicine

## 2022-09-30 ENCOUNTER — Other Ambulatory Visit: Payer: Self-pay | Admitting: Internal Medicine

## 2022-09-30 ENCOUNTER — Telehealth: Payer: Self-pay | Admitting: Internal Medicine

## 2022-09-30 NOTE — Telephone Encounter (Signed)
Patient notified of PCP instructions, verbalized understanding.

## 2022-09-30 NOTE — Telephone Encounter (Signed)
Call patient, no change for now, monitor BPs daily, if in the next few days it remained in the 160/90 let me know. Call sooner if any symptoms.

## 2022-09-30 NOTE — Telephone Encounter (Signed)
Pt said the last couple of mornings his blood pressure has been elevated (162/90 and 162/95). Pt said he is not having any headaches or blurred vision. Please call pt to advise if she should take more of his medication or do anything differently.

## 2022-11-10 ENCOUNTER — Other Ambulatory Visit: Payer: Self-pay | Admitting: Internal Medicine

## 2022-11-12 ENCOUNTER — Other Ambulatory Visit: Payer: Self-pay | Admitting: Internal Medicine

## 2022-11-12 DIAGNOSIS — I480 Paroxysmal atrial fibrillation: Secondary | ICD-10-CM

## 2022-11-12 NOTE — Telephone Encounter (Signed)
Prescription refill request for Eliquis received. Indication: afib  Last office visit: Hilty, 04/21/2022 Scr: 1.00, 05/10/2022 Age: 82 yo  Weight: 80.5 kg   Refill sent.

## 2022-11-15 ENCOUNTER — Telehealth: Payer: Self-pay | Admitting: Internal Medicine

## 2022-11-15 NOTE — Telephone Encounter (Signed)
Pt was returning a call to nurse and is requesting a callback. Please advise

## 2022-11-15 NOTE — Telephone Encounter (Signed)
Requesting: alprazolam 0.25mg   Contract: 09/01/21 UDS: 04/02/22 Last Visit: 09/08/22 Next Visit: 12/01/22 Last Refill:12/14/21 #60 and 1RF   Please Advise

## 2022-11-15 NOTE — Telephone Encounter (Signed)
Patient states on Friday BP 185/100?? HR 160. Stated felt "flushed at that time.  He sat and elevated feet. Took xanax and then BP went 135/85  HR 65. He states he had been for a walk, less than a mile. He came home and occurred approx 5 minutes after this. No chest pain, no increased SOB He states no other episodes since Friday.  He states he had an episode about 3 weeks ago.  The BP was not as high but the HR was over 100. He has no issues at this time.   He wants to see Dr Rennis Golden and First available October 30.  He is set for this appt. Please advise if he needs to be seen sooner with APP or if sooner appt is available with Dr Rennis Golden

## 2022-11-15 NOTE — Telephone Encounter (Signed)
FYI: This call has been transferred to triage nurse: the Triage Nurse. Once the result note has been entered staff can address the message at that time.  Patient called in with the following symptoms:  Red Word:elevated blood pressure   Please advise at Mobile [phone] (mobile)  Message is routed to Provider Pool.

## 2022-11-15 NOTE — Telephone Encounter (Signed)
Patient is aware to continue to monitor BP and bring readings to his appointment next month.

## 2022-11-15 NOTE — Telephone Encounter (Signed)
Pt c/o BP issue: STAT if pt c/o blurred vision, one-sided weakness or slurred speech  1. What are your last 5 BP readings?  Today 137/77 hr 60   2. Are you having any other symptoms (ex. Dizziness, headache, blurred vision, passed out)? No   3. What is your BP issue?  Pt called in stating his bp has been elevated and wants to know what to do.

## 2022-11-15 NOTE — Telephone Encounter (Signed)
Left voicemail to return call to office.

## 2022-11-15 NOTE — Telephone Encounter (Signed)
PDMP okay, Rx sent 

## 2022-11-15 NOTE — Telephone Encounter (Signed)
Pt has contacted cardiology as recommended.

## 2022-11-15 NOTE — Telephone Encounter (Signed)
Initial Comment [name] states that he is having issues with blood pressure. Currently it is at 137/77 heart rate of 60, but it has had high fluctuations throughout the weekend. GOTO Facility Not Listed cardiologist Translation No Nurse Assessment Nurse: Kevin Coco, RN, Kevin Bass Date/Time (Eastern Time): 11/15/2022 10:46:21 AM Confirm and document reason for call. If symptomatic, describe symptoms. ---[name] states his bp has been fluctuating over the weekend. Most recent bp: 137/77 this morning, hr 60. Denies any other symptoms. Takes bp meds, no missed doses. States his bp was 185/160 on Friday. Does the patient have any new or worsening symptoms? ---Yes Will a triage be completed? ---Yes Related visit to physician within the last 2 weeks? ---N/A Does the PT have any chronic conditions? (i.e. diabetes, asthma, this includes High risk factors for pregnancy, etc.) ---Yes List chronic conditions. ---mitral valve repair Is this a behavioral health or substance abuse call? ---No Guidelines Guideline Title Affirmed Question Affirmed Notes Nurse Date/Time (Eastern Time) Blood Pressure - High [1] Systolic BP >= 130 OR Diastolic >= 80 AND [2] taking BP medications [name], RN, Kevin Bass 11/15/2022 10:51:46 AM PLEASE NOTE: All timestamps contained within this report are represented as [place] Standard Time. CONFIDENTIALTY NOTICE: This fax transmission is intended only for the addressee. It contains information that is legally privileged, confidential or otherwise protected from use or disclosure. If you are not the intended recipient, you are strictly prohibited from reviewing, disclosing, copying using or disseminating any of this information or taking any action in reliance on or regarding this information. If you have received this fax in error, please notify us immediately by telephone so that we can arrange for its return to [place]. Phone: [phone], Toll-Free:  [phone], Fax: [phone] Page: 2 of 2 Call Id: [id number] Disp. Time Kevin Bass Time) Disposition Final User 11/15/2022 10:54:39 AM SEE PCP WITHIN 3 DAYS Yes [name], RN, Kevin Bass Final Disposition 11/15/2022 10:54:39 AM SEE PCP WITHIN 3 DAYS Yes [name], RN, [name] [name] Disposition Overriden: See PCP within 2 Weeks Override Reason: Patient's symptoms need a higher level of care [name] Disagree/Comply Comply [name] Understands Yes PreDisposition Call Doctor Care Advice Given Per Guideline SEE PCP WITHIN 3 DAYS: CALL BACK IF: * You become worse CARE ADVICE given per Blood Pressure - High (Adult) guideline. * Your blood pressure is over 180/110 * Chest pain or difficulty breathing occurs * Difficulty walking, difficulty talking, or severe headache occurs * Weakness or numbness of the face, arm or leg on one side of the body occurs Comments User: [name], RN Date/Time Kevin Bass Time): 11/15/2022 10:55:30 AM Upgraded outcome due to caller's concerns/ symptoms, & nurse judgment; see assessment. Referrals GO TO FACILITY OTHER - SPECIFY

## 2022-11-15 NOTE — Telephone Encounter (Signed)
Call to patient and LM to call office

## 2022-11-15 NOTE — Telephone Encounter (Signed)
Patient returned call

## 2022-11-16 ENCOUNTER — Ambulatory Visit: Payer: PPO | Admitting: Internal Medicine

## 2022-11-17 ENCOUNTER — Encounter (HOSPITAL_BASED_OUTPATIENT_CLINIC_OR_DEPARTMENT_OTHER): Payer: Self-pay | Admitting: Internal Medicine

## 2022-11-17 ENCOUNTER — Encounter: Payer: Self-pay | Admitting: Internal Medicine

## 2022-11-17 NOTE — Telephone Encounter (Signed)
Called pt and relayed Dr. Blanchie Dessert message. Pt states "it's not infrequent. It's almost every night over 150. Last night it was 160/82. 5 days ago my blood pressure was 180/90 and HR 160. I've had about 3 episodes the past month where my heart rate was up." Pt state the episode lasted about 3-5 minutes. It happens after my evening walk, about 1 mile. Pt states the last episode was last Friday, which was the last day he walked in the evening. Pt want's his medication be be reviewed to possible have another medication added in the evening since the blood pressure spikes in the evening.  Pt would like to be seen before 10/30, appt added to wait list.

## 2022-11-17 NOTE — Telephone Encounter (Signed)
Error

## 2022-11-17 NOTE — Telephone Encounter (Signed)
Pt calling back stating he would like to speak with a nurse about his elevated BP and discuss one of his medications. Please advise

## 2022-11-17 NOTE — Telephone Encounter (Signed)
Hilty MD sent a MyChart message to patient

## 2022-11-23 ENCOUNTER — Other Ambulatory Visit: Payer: Self-pay | Admitting: Internal Medicine

## 2022-12-01 ENCOUNTER — Ambulatory Visit: Payer: PPO | Admitting: Internal Medicine

## 2022-12-01 ENCOUNTER — Encounter: Payer: Self-pay | Admitting: Internal Medicine

## 2022-12-01 VITALS — BP 130/78 | HR 62 | Temp 97.8°F | Resp 99 | Ht 70.0 in | Wt 176.2 lb

## 2022-12-01 DIAGNOSIS — I480 Paroxysmal atrial fibrillation: Secondary | ICD-10-CM

## 2022-12-01 DIAGNOSIS — R739 Hyperglycemia, unspecified: Secondary | ICD-10-CM | POA: Diagnosis not present

## 2022-12-01 DIAGNOSIS — I10 Essential (primary) hypertension: Secondary | ICD-10-CM | POA: Diagnosis not present

## 2022-12-01 DIAGNOSIS — Z23 Encounter for immunization: Secondary | ICD-10-CM | POA: Diagnosis not present

## 2022-12-01 DIAGNOSIS — E782 Mixed hyperlipidemia: Secondary | ICD-10-CM

## 2022-12-01 LAB — COMPREHENSIVE METABOLIC PANEL
ALT: 12 U/L (ref 0–53)
AST: 15 U/L (ref 0–37)
Albumin: 4.4 g/dL (ref 3.5–5.2)
Alkaline Phosphatase: 57 U/L (ref 39–117)
BUN: 22 mg/dL (ref 6–23)
CO2: 32 meq/L (ref 19–32)
Calcium: 9.4 mg/dL (ref 8.4–10.5)
Chloride: 96 meq/L (ref 96–112)
Creatinine, Ser: 1.06 mg/dL (ref 0.40–1.50)
GFR: 64.55 mL/min (ref >60.00)
Glucose, Bld: 78 mg/dL (ref 70–99)
Potassium: 4.3 meq/L (ref 3.5–5.1)
Sodium: 134 meq/L — ABNORMAL LOW (ref 135–145)
Total Bilirubin: 0.7 mg/dL (ref 0.2–1.2)
Total Protein: 6.3 g/dL (ref 6.0–8.3)

## 2022-12-01 LAB — CBC WITH DIFFERENTIAL/PLATELET
Basophils Absolute: 0 10*3/uL (ref 0.0–0.1)
Basophils Relative: 0.5 % (ref 0.0–3.0)
Eosinophils Absolute: 0.1 10*3/uL (ref 0.0–0.7)
Eosinophils Relative: 2.6 % (ref 0.0–5.0)
HCT: 40.9 % (ref 39.0–52.0)
Hemoglobin: 13.8 g/dL (ref 13.0–17.0)
Lymphocytes Relative: 28 % (ref 12.0–46.0)
Lymphs Abs: 1.4 10*3/uL (ref 0.7–4.0)
MCHC: 33.7 g/dL (ref 30.0–36.0)
MCV: 84.3 fL (ref 78.0–100.0)
Monocytes Absolute: 0.4 10*3/uL (ref 0.1–1.0)
Monocytes Relative: 9.1 % (ref 3.0–12.0)
Neutro Abs: 2.9 10*3/uL (ref 1.4–7.7)
Neutrophils Relative %: 59.8 % (ref 43.0–77.0)
Platelets: 207 10*3/uL (ref 150.0–400.0)
RBC: 4.86 Mil/uL (ref 4.22–5.81)
RDW: 14.1 % (ref 11.5–15.5)
WBC: 4.8 10*3/uL (ref 4.0–10.5)

## 2022-12-01 LAB — LIPID PANEL
Cholesterol: 164 mg/dL (ref 0–200)
HDL: 45.7 mg/dL (ref >39.00)
LDL Cholesterol: 95 mg/dL (ref 0–99)
NonHDL: 118.1
Total CHOL/HDL Ratio: 4
Triglycerides: 116 mg/dL (ref 0.0–149.0)
VLDL: 23.2 mg/dL (ref 0.0–40.0)

## 2022-12-01 LAB — TSH: TSH: 2.15 u[IU]/mL (ref 0.35–5.50)

## 2022-12-01 LAB — HEMOGLOBIN A1C: Hgb A1c MFr Bld: 5.7 % (ref 4.6–6.5)

## 2022-12-01 NOTE — Assessment & Plan Note (Signed)
Prediabetes: Check A1c HTN: Had 2 episodes of elevated BP and tachycardia, see HPI.  Last episode 2 months ago, etiology unclear, completely asymptomatic at this point, rec observation and d/w cards. Ambulatory BPs otherwise are excellent: Systolic BP  range 102-139.  Diastolic BP 60-70.  Heart rate consistently in the 60s. Continue amlodipine, metoprolol, Micardis.  Check labs. Anxiety: Xanax, seems well-controlled A-fib: See above, currently asymptomatic, anticoagulated without apparent problems.  Checking a TSH, CBC. High cholesterol: On Pravachol, check FLP. Vaccine advice provided. RTC CPX 4 months.

## 2022-12-01 NOTE — Progress Notes (Signed)
Subjective:    Patient ID: Kevin Bass, male    DOB: [birth year], 82 y.o.   MRN: [id number]  DOS:  12/01/2022 Type of visit - description: f/u  In general feeling well. Had 2 episodes of increased blood pressure & heart rate.  Last episode happened at 2 months ago. The BP was as high as 190 and heart rate as high as 160 per patient.  In both locations that happent at the end of taking a walk. Symptoms subsided w/ rest and a Xanax. No LOC, no chest pain, no headache. He did feel slightly flushed.  Other than above, ambulatory BPs have been very good.   Review of Systems See above   Past Medical History:  Diagnosis Date   Allergy    Anxiety    Arthritis    hands   Atrial fibrillation (HCC)    Colon cancer (HCC)    s/p partial colectomy 1990, Cscope neg 7-09, next 2014   DISORDER, MITRAL VALVE    MV recontruction at [facility] 2006----still needs ABX for SBE prophylaxis    Elevated PSA    prostate nodule, Dr Laverle Patter   GERD (gastroesophageal reflux disease)    Heart murmur    no problems since surgery   HYPERGLYCEMIA, BORDERLINE 10/16/2008   HYPERLIPIDEMIA 06/15/2006   diet controlled   Hypertension    HYPERTROPHY PROSTATE W/O UR OBST & OTH LUTS 10/16/2008   Neuropathy    Right groin pain    Dr. Andrey Campanile   SCC (squamous cell carcinoma) 06/2017   s/p Mohs---Dr Volanda Napoleon   Stroke [facility])    Wears partial dentures    lower partial    Past Surgical History:  Procedure Laterality Date   COLECTOMY  1990   HERNIA REPAIR Left 2014   [facility] rep w/mesh- March 2014   LOOP RECORDER INSERTION N/A 12/01/2016   Procedure: LOOP RECORDER INSERTION;  Surgeon: Marinus Maw, MD;  Location: [facility] INVASIVE CV LAB;  Service: Cardiovascular;  Laterality: N/A;   MITRAL VALVE ANNULOPLASTY     [facility] 2006   TEE WITHOUT CARDIOVERSION N/A 10/05/2016   Procedure: TRANSESOPHAGEAL ECHOCARDIOGRAM (TEE);  Surgeon: Chrystie Nose, MD;  Location: [facility] ENDOSCOPY;  Service: Cardiovascular;  Laterality: N/A;     Current Outpatient Medications  Medication Instructions   acetaminophen (TYLENOL) 1,300 mg, Oral, Every 8 hours PRN   ALPRAZolam (XANAX) 0.25 MG tablet TAKE 1 TABLET(0.25 MG) BY MOUTH TWICE DAILY AS NEEDED FOR ANXIETY   amLODipine (NORVASC) 5 MG tablet TAKE 1 TABLET(5 MG) BY MOUTH IN THE MORNING AND AT BEDTIME   Arginine 1,000 mg, Oral, Daily   Baclofen 5 MG TABS Take 3 times a day   Coenzyme Q10 100 MG TABS 1 tablet, Oral, 2 times daily   Eliquis 5 mg, Oral, 2 times daily   gabapentin (NEURONTIN) 300 MG capsule Take 2 capsules in AM and 3 capsules at night   Glucosamine-MSM-Hyaluronic Acd (JOINT HEALTH PO) 1 capsule, Oral, Daily   loratadine (CLARITIN) 10 mg, Oral, Daily at bedtime   metoprolol succinate (TOPROL-XL) 75 mg, Oral, Daily, Take with or immediately following a meal   Multiple Vitamins-Minerals (OCUVITE PRESERVISION PO) 1 capsule, Oral, 2 times daily   nitroGLYCERIN (NITROSTAT) 0.4 mg, Sublingual, Every 5 min x3 PRN   NON FORMULARY 1-2 packets, Oral, See admin instructions, Lypo-Spheric vitamin C gel packets- Mix 1-2 packets of gel into juice one to two times a day and drink    NON FORMULARY 10 mg, Oral, See admin instructions,  [employer] Lyco-Sorb (for Prostate & Cardiovascular Health) 10 mg capsules: Take 10 mg by mouth two times a day   Omega-3 Fatty Acids (FISH OIL PO) 1 capsule, Oral, 2 times daily   omeprazole (PRILOSEC) 20 mg, Oral, Daily before breakfast   OXcarbazepine (TRILEPTAL) 150 MG tablet Take 1/2 tablet in AM, 1 tablet in PM   pravastatin (PRAVACHOL) 20 mg, Oral, Every evening   telmisartan (MICARDIS) 80 mg, Oral, Daily   Vitamin D, Cholecalciferol, 25 MCG (1000 UT) TABS 1 tablet, Oral, Daily       Objective:   Physical Exam BP 130/78   Pulse 62   Temp 97.8 F (36.6 C) (Oral)   Resp (!) 99   Ht 5\' 10"  (1.778 m)   Wt 176 lb 4 oz (79.9 kg)   BMI 25.29 kg/m  General:   Well developed, NAD, BMI noted. HEENT:  Normocephalic . Face symmetric,  atraumatic Lungs:  CTA B Normal respiratory effort, no intercostal retractions, no accessory muscle use. Heart: RRR,  no murmur.  Lower extremities: Trace pretibial edema bilaterally . Calves soft, nontender, R one is slightly larger in circumference. Skin: Not pale. Not jaundice Neurologic:  alert & oriented X3.  Speech normal, gait appropriate for age and unassisted Psych--  Cognition and judgment appear intact.  Cooperative with normal attention span and concentration.  Behavior appropriate. No anxious or depressed appearing.      Assessment     Assessment Prediabetes HTN -Intolerant to low-dose clonidine,high-dose amlodipine,HCTZ dizziness Hyperlipidemia, (intolerant to simva-pravachol: shoulder pain) Anxiety- rarely takes xanax Neuro  -- Trigeminal neuralgia  dx 2015, no w/u, sx resurface x1 after temporary d/c of meds  --saw neuro again 02/2017, labs (-), had a NCS, Rx gaba CV: --Mitral valve annuloplasty 2006-- needs SBE prophylaxis -- SOB 04-2016, seen at [facility], ECHO Nl fx MV, cath: no CAD --Cryptogenic stroke 09/12/2016,  started plavix -- new R PICA stroke,  11-30-16, implanted loop recorder placed --A-FIB noted on ILR ~ 12-21-2016, changed to eliquis  -- 11/2017:   L Hippocampus CVA with acute L posterior cerabral artery Elevated PSA and BPH-- Dr. Laverle Patter Colon cancer partial colectomy 1990, SCC . MOHS 06/2017  PLAN: Prediabetes: Check A1c HTN: Had 2 episodes of elevated BP and tachycardia, see HPI.  Last episode 2 months ago, etiology unclear, completely asymptomatic at this point, rec observation and d/w cards. Ambulatory BPs otherwise are excellent: Systolic BP  range 102-139.  Diastolic BP 60-70.  Heart rate consistently in the 60s. Continue amlodipine, metoprolol, Micardis.  Check labs. Anxiety: Xanax, seems well-controlled A-fib: See above, currently asymptomatic, anticoagulated without apparent problems.  Checking a TSH, CBC. High cholesterol: On Pravachol,  check FLP. Vaccine advice provided. RTC CPX 4 months.

## 2022-12-01 NOTE — Patient Instructions (Addendum)
Vaccines I recommend: Covid booster-new this fall RSV vaccine  Continue checking your blood pressure regularly Blood pressure goal:  between 110/65 and  135/85. If it is consistently higher or lower, let me know     GO TO THE LAB : Get the blood work     Next visit with me for a physical exam in 4 months Please schedule it at the front desk

## 2022-12-03 MED ORDER — PRAVASTATIN SODIUM 40 MG PO TABS: 40.0000 mg | ORAL_TABLET | Freq: Every day | ORAL | 1 refills | Status: DC

## 2022-12-03 NOTE — Addendum Note (Signed)
Addended byConrad [place] D on: 12/03/2022 07:48 AM   Modules accepted: Orders

## 2022-12-22 ENCOUNTER — Encounter (HOSPITAL_BASED_OUTPATIENT_CLINIC_OR_DEPARTMENT_OTHER): Payer: Self-pay | Admitting: Internal Medicine

## 2022-12-22 ENCOUNTER — Ambulatory Visit (HOSPITAL_BASED_OUTPATIENT_CLINIC_OR_DEPARTMENT_OTHER): Payer: PPO | Admitting: Internal Medicine

## 2022-12-22 VITALS — BP 120/80 | HR 53 | Ht 70.0 in | Wt 175.0 lb

## 2022-12-22 DIAGNOSIS — I480 Paroxysmal atrial fibrillation: Secondary | ICD-10-CM

## 2022-12-22 DIAGNOSIS — I059 Rheumatic mitral valve disease, unspecified: Secondary | ICD-10-CM | POA: Diagnosis not present

## 2022-12-22 DIAGNOSIS — Z7901 Long term (current) use of anticoagulants: Secondary | ICD-10-CM | POA: Diagnosis not present

## 2022-12-22 DIAGNOSIS — Z9889 Other specified postprocedural states: Secondary | ICD-10-CM | POA: Diagnosis not present

## 2022-12-22 DIAGNOSIS — E785 Hyperlipidemia, unspecified: Secondary | ICD-10-CM | POA: Diagnosis not present

## 2022-12-22 DIAGNOSIS — I10 Essential (primary) hypertension: Secondary | ICD-10-CM | POA: Diagnosis not present

## 2022-12-22 NOTE — Progress Notes (Addendum)
OFFICE CONSULT NOTE  Chief Complaint:  Increased heart rate  Primary Care Physician: Wanda Plump, MD  HPI:  Kevin Bass is a 82 y.o. male who is being seen today for the evaluation of workup of cryptogenic stroke at the request of Wanda Plump, MD. [name] is a pleasant 82 year old patient who recently had an unfortunate cryptogenic stroke. His past ocular history significant for mitral valve disease status post repair at [facility]. He is followed by cardiologist there and recently was admitted to [facility] with cryptogenic stroke. As per routine care or transesophageal echocardiogram as well as a implanted loop recorder were recommended. Unfortunately there was difficulty during the transesophageal echocardiogram procedure which involved technical difficulties with equipment and it was not able to be completed. There was some concern that the probe could not be passed adequately and he underwent upper esophageal imaging which was normal. He has had prior teas without any difficulty. After that he decided against a loop recorder and wanted to get another opinion from his cardiologist at [facility]. There was a visit there which did not indicate that a transesophageal echocardiogram would be helpful. I do not see any evidence on his prior echocardiograms of a bubble study to exclude PFO. This could be a significant finding as there is evidence now that closure of a small PFO in the setting of a patient whose had a cryptogenic stroke is guideline indicated. It's also possible that he could have atrial fibrillation, especially given his history of mitral valve disease and valve repair which is a significant risk factor for arrhythmias.  12/21/2016  Kevin Bass was seen today in follow-up.  He underwent a repeat echocardiogram which did not demonstrate any LV thrombus or evidence for atrial septal defect.  Subsequently he had placement of an implanted loop recorder by Dr. Ladona Ridgel which was  seen in follow-up and appears to be well-healed.  There is no evidence today of any significant arrhythmias such as atrial fibrillation.  He is currently on aspirin and Plavix for stroke prevention.  He is also had increasing doses of medication to control hypertension.  Currently his blood pressure is well controlled at 108/70.  He is appropriately on rosuvastatin, ezetimibe and fish oil with an LDL-C is 60 approximately 3 weeks ago.  06/17/2017  [name] returns today for follow-up.  Overall he says he feels fairly well.  He denies any significant palpitations or recurrent atrial fibrillation.  EKG today shows normal sinus rhythm at 62.  He has been working with Dr. Karel Jarvis for neuropathy.  Is currently on very high dose of gabapentin (a total of 1500 mg daily).  He is tolerating apixaban without any bleeding difficulty and cholesterol has been well controlled on rosuvastatin and ezetimibe.  Only, today he is complaining of some lower extremity discomfort and minimal edema.  He says this is worse on long car rides.  He does have notable bilateral lower extremity varicosities, and would likely benefit from compression stockings.  07/11/2017  Kevin Bass was seen today in follow-up.  I last saw him about a month ago.  He was doing well without any symptomatic A. fib.  His A. fib was detected by implanted loop recorder.  He has been on anticoagulation.  Most recently he was in the emergency department for symptomatic atrial fibrillation with heart rate as high as 200 when he was seen by EMS.  He was treated and seen in the emergency department.  He was started on metoprolol  and converted back to sinus rhythm.  He had questions about the value of the implanted loop recorder, however I reminded him that his job was not to notify you of arrhythmias as they were happening.    03/07/2018  Kevin Bass returns today for follow-up.  Recently he has been reported labile blood pressure.  He notes that he is  having spikes in his blood pressure mostly in the afternoons at times up to 170 although has some lower blood pressures in the morning.  I personally reviewed his blood pressure cuff (which incidentally is close to 84 years old) and straining some lability in his blood pressures.  Currently he is taking 75 mg of metoprolol in the morning along with 50 mg of losartan and was also prescribed to take an additional hydrochlorothiazide.  He said the hydrochlorothiazide was making him dizzy and therefore he has stopped the medication.  Of note he did have a recurrent TIA affecting the right PICA territory.  There was some associated transient vertigo.  This apparently happened after holding his Eliquis for 2 days due to a scheduled median and ulnar nerve decompression.  It was felt that A. fib was the cause of this however it seems fairly unlikely that he is developed thrombus during that period of time to cause stroke.  I do believe there is likely another explanation for this cause.  I do not want him to feel that he could never come off of blood thinners because of the risk for stroke.  09/21/2018  Kevin Bass was seen today in follow-up.  Overall he seems to be doing better.  Fortunately had no further strokes after that episode where he discontinued his Eliquis for 4 doses.  He has decreased his Toprol-XL down to 25 mg from 75 mg daily.  Blood pressure is well controlled and heart rate is in the low 60s.  He continues to have remote checks without any significant findings.  The plan is to allow the device to run out of battery life and then he may consider either explantation or just leaving it.  He is on Eliquis without any bleeding complications.  He last had an echo in October 2019 which showed stability of his mitral valve repair.  05/29/2019  [name] returns today for follow-up.  Overall he says he is feeling well.  Denies any chest pain or worsening shortness of breath.  Recent lipids are excellent  in fact has had a marked reduction in his LDL cholesterol.  He says it previously was because he had stopped taking his statin and was only on the Zetia due to concern for side effects however he felt like he was not having significant side effects and therefore went back on the medicine.  More recently total cholesterol is now 91, triglycerides 127, HDL 40 and LDL of 26 (reduced from 120).  Blood pressure is well controlled today.  He is blood pressure though is labile and by history his PCP has adjusted his medications.  Specifically his losartan has been decreased to 75 mg daily in the morning and metoprolol XL has decreased to 75 mg as well.  He denies any further TIA events.  He denies any bleeding difficulty on Eliquis.  01/22/2022  [name] is seen today in follow-up.  Overall he says he is doing pretty well.  He was seen by my partner last year for some chest pain.  She recommended a stress test but he was hesitant to proceed.  He  spoke with me on the phone and I recommended we go ahead and do that.  Fortunately came back negative.  This year he was in the emergency department in October with elevated blood pressure.  He says occasionally gets spikes with this.  Since then however it has been better regulated.  He denies any chest pain or shortness of breath.  04/21/2022  Kevin Bass returns today for follow-up.  He has been concerned about elevated blood pressures at home.  He works out to his primary care provider about this who recommended an increase in his clonidine up to 0.1 mg 3 times daily.  He has had side effects with this and is currently only taking 0.1 mg at night in addition to 5 mg of amlodipine and he takes losartan 75 mg in the morning and 25 mg at night along with Toprol-XL 75 mg daily.  In general he says his blood pressures are better in the morning although a number of his readings have been elevated.  Most of his blood pressures seem high between 152 up to 170 systolic.   Blood pressure today however was normal at 120/60.  He denies any issues with his cough and says he has 2 blood pressure cuffs which he feels are fairly recent and accurate.  12/22/2022  Kevin Bass is seen today in follow-up.  Overall he says he is feeling well.  He recently saw Dr. Drue Novel for a physical.  He had lab work which indicated that his cholesterol was still above target with an LDL of 95.  It was advised to increase his pravastatin up to 40 mg daily.  He remains on the Eliquis without any bleeding issues.  He has had 3 episodes of tachycardia for which he took Xanax and had relief of those symptoms.  Overall though his heart rate is usually well-controlled at bradycardic either in the 50s or 60s as and has been today.  Blood pressure appears to be well-controlled at 120/80 although her blood pressure readings which I reviewed range between 115-145 depending of the day.  He had not yet scheduled his echo until recently but is scheduled to have a repeat echo next week to reassess his mitral valve repair.  PMHx:  Past Medical History:  Diagnosis Date   Allergy    Anxiety    Arthritis    hands   Atrial fibrillation (HCC)    Colon cancer (HCC)    s/p partial colectomy 1990, Cscope neg 7-09, next 2014   DISORDER, MITRAL VALVE    MV recontruction at [facility] 2006----still needs ABX for SBE prophylaxis    Elevated PSA    prostate nodule, Dr Laverle Patter   GERD (gastroesophageal reflux disease)    Heart murmur    no problems since surgery   HYPERGLYCEMIA, BORDERLINE 10/16/2008   HYPERLIPIDEMIA 06/15/2006   diet controlled   Hypertension    HYPERTROPHY PROSTATE W/O UR OBST & OTH LUTS 10/16/2008   Neuropathy    Right groin pain    Dr. Andrey Campanile   SCC (squamous cell carcinoma) 06/2017   s/p Mohs---Dr Volanda Napoleon   Stroke [facility])    Wears partial dentures    lower partial    Past Surgical History:  Procedure Laterality Date   COLECTOMY  1990   HERNIA REPAIR Left 2014   [facility] rep w/mesh- March 2014    LOOP RECORDER INSERTION N/A 12/01/2016   Procedure: LOOP RECORDER INSERTION;  Surgeon: Marinus Maw, MD;  Location: [facility] INVASIVE CV LAB;  Service: Cardiovascular;  Laterality: N/A;   MITRAL VALVE ANNULOPLASTY     [facility] 2006   TEE WITHOUT CARDIOVERSION N/A 10/05/2016   Procedure: TRANSESOPHAGEAL ECHOCARDIOGRAM (TEE);  Surgeon: Chrystie Nose, MD;  Location: [facility] ENDOSCOPY;  Service: Cardiovascular;  Laterality: N/A;    FAMHx:  Family History  Problem Relation Age of Onset   Colon cancer Other        M side (cousins)   Diabetes Neg Hx    Stroke Neg Hx    CAD Neg Hx    Prostate cancer Neg Hx    Rectal cancer Neg Hx    Esophageal cancer Neg Hx     SOCHx:   reports that he quit smoking about 39 years ago. His smoking use included cigarettes. He started smoking about 59 years ago. He has a 10 pack-year smoking history. He has never used smokeless tobacco. He reports current alcohol use of about 7.0 standard drinks of alcohol per week. He reports that he does not use drugs.  ALLERGIES:  Allergies  Allergen Reactions   Prednisolone Acetate     Increased intraocular pressure   Rosuvastatin     Muscle, arm and foot pain   Statins Other (See Comments)    Myalgias (intolerance) Simvastatin, atorvastatin, pravastatin    ROS: Pertinent items noted in HPI and remainder of comprehensive ROS otherwise negative.  HOME MEDS: Current Outpatient Medications on File Prior to Visit  Medication Sig Dispense Refill   acetaminophen (TYLENOL) 650 MG CR tablet Take 1,300 mg by mouth every 8 (eight) hours as needed for pain.     ALPRAZolam (XANAX) 0.25 MG tablet TAKE 1 TABLET(0.25 MG) BY MOUTH TWICE DAILY AS NEEDED FOR ANXIETY 60 tablet 1   amLODipine (NORVASC) 5 MG tablet TAKE 1 TABLET(5 MG) BY MOUTH IN THE MORNING AND AT BEDTIME 180 tablet 0   Arginine 500 MG CAPS Take 1,000 mg by mouth daily.      Baclofen 5 MG TABS Take 3 times a day 270 tablet 3   Coenzyme Q10 100 MG TABS Take 1 tablet by mouth  in the morning and at bedtime.     ELIQUIS 5 MG TABS tablet TAKE 1 TABLET BY MOUTH TWICE DAILY 180 tablet 1   gabapentin (NEURONTIN) 300 MG capsule Take 2 capsules in AM and 3 capsules at night 450 capsule 3   Glucosamine-MSM-Hyaluronic Acd (JOINT HEALTH PO) Take 1 capsule by mouth daily.      loratadine (CLARITIN) 10 MG tablet Take 10 mg by mouth at bedtime.     metoprolol succinate (TOPROL-XL) 25 MG 24 hr tablet Take 3 tablets (75 mg total) by mouth daily. Take with or immediately following a meal 270 tablet 1   Multiple Vitamins-Minerals (OCUVITE PRESERVISION PO) Take 1 capsule by mouth 2 (two) times daily.      nitroGLYCERIN (NITROSTAT) 0.4 MG SL tablet Place 1 tablet (0.4 mg total) under the tongue every 5 (five) minutes x 3 doses as needed for chest pain. 25 tablet 3   NON FORMULARY Take 1-2 packets by mouth See admin instructions. Lypo-Spheric vitamin C gel packets- Mix 1-2 packets of gel into juice one to two times a day and drink     NON FORMULARY Take 10 mg by mouth See admin instructions. [employer] Lyco-Sorb (for Prostate & Cardiovascular Health) 10 mg capsules: Take 10 mg by mouth two times a day     Omega-3 Fatty Acids (FISH OIL PO) Take 1 capsule by mouth in the morning  and at bedtime.     omeprazole (PRILOSEC) 20 MG capsule Take 1 capsule (20 mg total) by mouth daily before breakfast. 90 capsule 1   OXcarbazepine (TRILEPTAL) 150 MG tablet Take 1/2 tablet in AM, 1 tablet in PM 135 tablet 3   pravastatin (PRAVACHOL) 40 MG tablet Take 1 tablet (40 mg total) by mouth at bedtime. 90 tablet 1   telmisartan (MICARDIS) 80 MG tablet Take 1 tablet (80 mg total) by mouth daily. 90 tablet 3   Vitamin D, Cholecalciferol, 25 MCG (1000 UT) TABS Take 1 tablet by mouth daily.     No current facility-administered medications on file prior to visit.    LABS/IMAGING: No results found for this or any previous visit (from the past 48 hour(s)). ECHOCARDIOGRAM COMPLETE  Result Date: 12/29/2022     ECHOCARDIOGRAM REPORT   Patient Name:   COADY TRAIN Date of Exam: 12/29/2022 Medical Rec #:  [id number]          Height:       70.0 in Accession #:    [id number]         Weight:       175.0 lb Date of Birth:  10-Nov-1940          BSA:          1.972 m Patient Age:    82 years           BP:           132/60 mmHg Patient Gender: M                  HR:           60 bpm. Exam Location:  Outpatient Procedure: 2D Echo, 3D Echo, Cardiac Doppler, Color Doppler and Strain Analysis Indications:    Mitral Valve Disorder  History:        Patient has prior history of Echocardiogram examinations, most                 recent 12/03/2017. TIA, Arrythmias:Atrial Fibrillation; Risk                 Factors:Hypertension, Former Smoker and Dyslipidemia. MITRAL                 VALVE ANNULOPLASTY                 [facility] 2006.                  Mitral Valve: prosthetic annuloplasty ring valve is present in                 the mitral position.  Sonographer:    Jeryl Columbia RDCS Referring Phys: [phone] Gentry Pilson C Tyran Huser IMPRESSIONS  1. Left ventricular ejection fraction, by estimation, is 50 to 55%. The left ventricle has low normal function. The left ventricle has no regional wall motion abnormalities. Left ventricular diastolic parameters are indeterminate.  2. Right ventricular systolic function is normal. The right ventricular size is normal.  3. Left atrial size was mildly dilated.  4. The mitral valve has been repaired/replaced. Trivial mitral valve regurgitation. No evidence of mitral stenosis. The mean mitral valve gradient is 3.0 mmHg. There is a prosthetic annuloplasty ring present in the mitral position.  5. The aortic valve is tricuspid. Aortic valve regurgitation is not visualized. No aortic stenosis is present.  6. The inferior vena cava is normal in size with greater than 50% respiratory variability, suggesting right atrial pressure  of 3 mmHg. FINDINGS  Left Ventricle: Left ventricular ejection fraction, by estimation, is 50 to  55%. The left ventricle has low normal function. The left ventricle has no regional wall motion abnormalities. 3D ejection fraction reviewed and evaluated as part of the interpretation. Alternate measurement of EF is felt to be most reflective of LV function. The left ventricular internal cavity size was normal in size. There is no left ventricular hypertrophy. Left ventricular diastolic parameters are indeterminate. Right Ventricle: The right ventricular size is normal. No increase in right ventricular wall thickness. Right ventricular systolic function is normal. Left Atrium: Left atrial size was mildly dilated. Right Atrium: Right atrial size was normal in size. Pericardium: There is no evidence of pericardial effusion. Mitral Valve: The mitral valve has been repaired/replaced. Trivial mitral valve regurgitation. There is a prosthetic annuloplasty ring present in the mitral position. No evidence of mitral valve stenosis. MV peak gradient, 6.1 mmHg. The mean mitral valve  gradient is 3.0 mmHg. Tricuspid Valve: The tricuspid valve is normal in structure. Tricuspid valve regurgitation is mild . No evidence of tricuspid stenosis. Aortic Valve: The aortic valve is tricuspid. Aortic valve regurgitation is not visualized. No aortic stenosis is present. Aortic valve mean gradient measures 5.0 mmHg. Aortic valve peak gradient measures 8.3 mmHg. Aortic valve area, by VTI measures 2.15 cm. Pulmonic Valve: The pulmonic valve was normal in structure. Pulmonic valve regurgitation is mild. No evidence of pulmonic stenosis. Aorta: The aortic root is normal in size and structure. Venous: The inferior vena cava is normal in size with greater than 50% respiratory variability, suggesting right atrial pressure of 3 mmHg. IAS/Shunts: No atrial level shunt detected by color flow Doppler.  LEFT VENTRICLE PLAX 2D LVIDd:         3.59 cm   Diastology LVIDs:         2.11 cm   LV e' medial:    3.59 cm/s LV PW:         1.58 cm   LV E/e'  medial:  34.8 LV IVS:        1.09 cm   LV e' lateral:   9.03 cm/s LVOT diam:     2.20 cm   LV E/e' lateral: 13.8 LV SV:         76 LV SV Index:   39 LVOT Area:     3.80 cm                           3D Volume EF:                          3D EF:        68 %                          LV EDV:       140 ml                          LV ESV:       45 ml                          LV SV:        95 ml RIGHT VENTRICLE RV Basal diam:  3.98 cm RV Mid diam:    2.71 cm RV S prime:  6.64 cm/s TAPSE (M-mode): 1.8 cm LEFT ATRIUM             Index        RIGHT ATRIUM           Index LA diam:        4.60 cm 2.33 cm/m   RA Area:     22.00 cm LA Vol (A2C):   61.7 ml 31.29 ml/m  RA Volume:   63.50 ml  32.20 ml/m LA Vol (A4C):   49.7 ml 25.20 ml/m LA Biplane Vol: 57.0 ml 28.90 ml/m  AORTIC VALVE AV Area (Vmax):    2.35 cm AV Area (Vmean):   2.24 cm AV Area (VTI):     2.15 cm AV Vmax:           144.00 cm/s AV Vmean:          98.700 cm/s AV VTI:            0.356 m AV Peak Grad:      8.3 mmHg AV Mean Grad:      5.0 mmHg LVOT Vmax:         89.10 cm/s LVOT Vmean:        58.100 cm/s LVOT VTI:          0.201 m LVOT/AV VTI ratio: 0.56  AORTA Ao Root diam: 2.95 cm Ao Asc diam:  3.30 cm MITRAL VALVE                TRICUSPID VALVE MV Area (PHT): 2.73 cm     TR Peak grad:   21.0 mmHg MV Area VTI:   1.84 cm     TR Vmax:        229.00 cm/s MV Peak grad:  6.1 mmHg MV Mean grad:  3.0 mmHg     SHUNTS MV Vmax:       1.23 m/s     Systemic VTI:  0.20 m MV Vmean:      73.7 cm/s    Systemic Diam: 2.20 cm MV Decel Time: 278 msec MV E velocity: 125.00 cm/s MV A velocity: 98.50 cm/s MV E/A ratio:  1.27 Kevin Schultz MD Electronically signed by Kevin Schultz MD Signature Date/Time: 12/29/2022/4:04:50 PM    Final     LIPID PANEL:    Component Value Date/Time   CHOL 164 12/01/2022 1136   TRIG 116.0 12/01/2022 1136   HDL 45.70 12/01/2022 1136   CHOLHDL 4 12/01/2022 1136   VLDL 23.2 12/01/2022 1136   LDLCALC 95 12/01/2022 1136   LDLCALC 51 08/24/2021  1352   LDLDIRECT 149.1 08/30/2012 1352    WEIGHTS: Wt Readings from Last 3 Encounters:  12/22/22 175 lb (79.4 kg)  12/01/22 176 lb 4 oz (79.9 kg)  07/26/22 177 lb 6.4 oz (80.5 kg)    VITALS: BP 120/80   Pulse (!) 53   Ht 5\' 10"  (1.778 m)   Wt 175 lb (79.4 kg)   SpO2 100%   BMI 25.11 kg/m   EXAM: General appearance: alert and no distress Neck: no carotid bruit, no JVD, and thyroid not enlarged, symmetric, no tenderness/mass/nodules Lungs: clear to auscultation bilaterally Heart: regular rate and rhythm, S1, S2 normal, and systolic murmur: early systolic 2/6, blowing at apex Abdomen: soft, non-tender; bowel sounds normal; no masses,  no organomegaly Extremities: extremities normal, atraumatic, no cyanosis or edema and varicose veins noted Pulses: 2+ and symmetric Skin: Skin color, texture, turgor normal. No rashes or lesions Neurologic: Grossly normal Psych: Pleasant  EKG: EKG Interpretation Date/Time:  Wednesday December 22 2022 13:19:16 EDT Ventricular Rate:  53 PR Interval:  166 QRS Duration:  94 QT Interval:  422 QTC Calculation: 395 R Axis:   16  Text Interpretation: Sinus bradycardia with Premature atrial complexes Cannot rule out Anterior infarct (cited on or before 22-Dec-2022) When compared with ECG of 22-Nov-2021 21:09, Premature atrial complexes are now Present Confirmed by Zoila Shutter [phone]) on 12/22/2022 1:22:27 PM    ASSESSMENT: Labile blood pressure Recurrent stroke Status post mitral valve repair A-fib - S/p ILR CHADSVASC Score of 4 - on Eliquis Essential hypertension Dyslipidemia, goal LDL less than 70  PLAN: 1.   Mr. Fiveash seems to be doing well although has had some labile blood pressures at home his blood pressure today was normal.  Heart rate is low but he also reports having had 3 episodes of tachycardia which seem to improve with Xanax.  Is not clear whether these are breakthrough episodes of A-fib but I encouraged him to monitor that  and if they occur more regularly we could consider monitoring.  He has an echo next week which I will follow-up on mostly to assess his prior mitral valve repair.  Cholesterol is still above target LDL less than 70 and I agree with the increase in his pravastatin.  He should also continue to work on diet and exercise to help lower cholesterol.  Plan follow-up with me in 6 months or sooner if necessary.  Chrystie Nose, MD, [facility], FACP  [place]  [facility]  Medical Director of the Advanced Lipid Disorders &  Cardiovascular Risk Reduction Clinic Diplomate of the American Board of Clinical Lipidology Attending Cardiologist  Direct Dial: [phone]  Fax: [phone]  Website:  [email or web address] Nicely Tremeka Helbling 12/30/2022, 11:15 AM

## 2022-12-22 NOTE — Patient Instructions (Signed)
Medication Instructions:  NO CHANGES  *If you need a refill on your cardiac medications before your next appointment, please call your pharmacy*   Testing/Procedures: Echocardiogram as scheduled   Follow-Up: At [facility], you and your health needs are our priority.  As part of our continuing mission to provide you with exceptional heart care, we have created designated Provider Care Teams.  These Care Teams include your primary Cardiologist (physician) and Advanced Practice Providers (APPs -  Physician Assistants and Nurse Practitioners) who all work together to provide you with the care you need, when you need it.  We recommend signing up for the patient portal called "MyChart".  Sign up information is provided on this After Visit Summary.  MyChart is used to connect with patients for Virtual Visits (Telemedicine).  Patients are able to view lab/test results, encounter notes, upcoming appointments, etc.  Non-urgent messages can be sent to your provider as well.   To learn more about what you can do with MyChart, go to [email or web address].    Your next appointment:   6 months with Dr. Rennis Golden -- call in December for April/May appointment

## 2022-12-29 ENCOUNTER — Other Ambulatory Visit (HOSPITAL_BASED_OUTPATIENT_CLINIC_OR_DEPARTMENT_OTHER): Payer: PPO

## 2022-12-29 DIAGNOSIS — Z954 Presence of other heart-valve replacement: Secondary | ICD-10-CM

## 2022-12-29 DIAGNOSIS — I480 Paroxysmal atrial fibrillation: Secondary | ICD-10-CM | POA: Diagnosis not present

## 2022-12-29 DIAGNOSIS — Z9889 Other specified postprocedural states: Secondary | ICD-10-CM | POA: Diagnosis not present

## 2022-12-29 LAB — ECHOCARDIOGRAM COMPLETE
AR max vel: 2.35 cm2
AV Area VTI: 2.15 cm2
AV Area mean vel: 2.24 cm2
AV Mean grad: 5 mm[Hg]
AV Peak grad: 8.3 mm[Hg]
Ao pk vel: 1.44 m/s
Area-P 1/2: 2.73 cm2
MV VTI: 1.84 cm2
S' Lateral: 2.11 cm

## 2022-12-30 ENCOUNTER — Ambulatory Visit: Payer: PPO

## 2022-12-30 VITALS — Ht 70.0 in | Wt 175.0 lb

## 2022-12-30 DIAGNOSIS — Z0000 Encounter for general adult medical examination without abnormal findings: Secondary | ICD-10-CM

## 2022-12-30 NOTE — Progress Notes (Signed)
Subjective:   Kevin Bass is a 82 y.o. male who presents for Medicare Annual/Subsequent preventive examination.  Visit Complete: Virtual I connected with  [name] on 12/30/22 by a audio enabled telemedicine application and verified that I am speaking with the correct person using two identifiers.  Patient Location: Home  Provider Location: Home Office  I discussed the limitations of evaluation and management by telemedicine. The patient expressed understanding and agreed to proceed.  Vital Signs: Because this visit was a virtual/telehealth visit, some criteria may be missing or patient reported. Any vitals not documented were not able to be obtained and vitals that have been documented are patient reported.    Cardiac Risk Factors include: advanced age (>9men, >3 women);hypertension;male gender     Objective:    Today's Vitals   12/30/22 1435  Weight: 175 lb (79.4 kg)  Height: 5\' 10"  (1.778 m)   Body mass index is 25.11 kg/m.     12/30/2022    2:46 PM 07/26/2022    3:31 PM 01/18/2022   10:44 AM 12/15/2021    1:45 PM 11/22/2021    9:03 PM 11/18/2021    8:39 AM 08/17/2021    2:58 PM  Advanced Directives  Does Patient Have a Medical Advance Directive? Yes Yes Yes Yes No Yes Yes  Type of Estate agent of [place];Living will Healthcare Power of [place];Living will;Out of facility DNR (pink MOST or yellow form) Healthcare Power of [place];Living will Healthcare Power of [place];Living will     Does patient want to make changes to medical advance directive?    No - Patient declined     Copy of Healthcare Power of Attorney in Chart? No - copy requested   No - copy requested     Would patient like information on creating a medical advance directive?     No - Patient declined      Current Medications (verified) Outpatient Encounter Medications as of 12/30/2022  Medication Sig   acetaminophen (TYLENOL) 650 MG CR tablet Take 1,300 mg by  mouth every 8 (eight) hours as needed for pain.   ALPRAZolam (XANAX) 0.25 MG tablet TAKE 1 TABLET(0.25 MG) BY MOUTH TWICE DAILY AS NEEDED FOR ANXIETY   amLODipine (NORVASC) 5 MG tablet TAKE 1 TABLET(5 MG) BY MOUTH IN THE MORNING AND AT BEDTIME   Arginine 500 MG CAPS Take 1,000 mg by mouth daily.    Baclofen 5 MG TABS Take 3 times a day   Coenzyme Q10 100 MG TABS Take 1 tablet by mouth in the morning and at bedtime.   ELIQUIS 5 MG TABS tablet TAKE 1 TABLET BY MOUTH TWICE DAILY   gabapentin (NEURONTIN) 300 MG capsule Take 2 capsules in AM and 3 capsules at night   Glucosamine-MSM-Hyaluronic Acd (JOINT HEALTH PO) Take 1 capsule by mouth daily.    loratadine (CLARITIN) 10 MG tablet Take 10 mg by mouth at bedtime.   metoprolol succinate (TOPROL-XL) 25 MG 24 hr tablet Take 3 tablets (75 mg total) by mouth daily. Take with or immediately following a meal   Multiple Vitamins-Minerals (OCUVITE PRESERVISION PO) Take 1 capsule by mouth 2 (two) times daily.    nitroGLYCERIN (NITROSTAT) 0.4 MG SL tablet Place 1 tablet (0.4 mg total) under the tongue every 5 (five) minutes x 3 doses as needed for chest pain.   NON FORMULARY Take 1-2 packets by mouth See admin instructions. Lypo-Spheric vitamin C gel packets- Mix 1-2 packets of gel into juice one to two times  a day and drink   NON FORMULARY Take 10 mg by mouth See admin instructions. [employer] Formulas Lyco-Sorb (for Prostate & Cardiovascular Health) 10 mg capsules: Take 10 mg by mouth two times a day   Omega-3 Fatty Acids (FISH OIL PO) Take 1 capsule by mouth in the morning and at bedtime.   omeprazole (PRILOSEC) 20 MG capsule Take 1 capsule (20 mg total) by mouth daily before breakfast.   OXcarbazepine (TRILEPTAL) 150 MG tablet Take 1/2 tablet in AM, 1 tablet in PM   pravastatin (PRAVACHOL) 40 MG tablet Take 1 tablet (40 mg total) by mouth at bedtime.   telmisartan (MICARDIS) 80 MG tablet Take 1 tablet (80 mg total) by mouth daily.   Vitamin D, Cholecalciferol,  25 MCG (1000 UT) TABS Take 1 tablet by mouth daily.   No facility-administered encounter medications on file as of 12/30/2022.    Allergies (verified) Prednisolone acetate, Rosuvastatin, and Statins   History: Past Medical History:  Diagnosis Date   Allergy    Anxiety    Arthritis    hands   Atrial fibrillation (HCC)    Colon cancer (HCC)    s/p partial colectomy 1990, Cscope neg 7-09, next 2014   DISORDER, MITRAL VALVE    MV recontruction at [facility] 2006----still needs ABX for SBE prophylaxis    Elevated PSA    prostate nodule, Dr Laverle Patter   GERD (gastroesophageal reflux disease)    Heart murmur    no problems since surgery   HYPERGLYCEMIA, BORDERLINE 10/16/2008   HYPERLIPIDEMIA 06/15/2006   diet controlled   Hypertension    HYPERTROPHY PROSTATE W/O UR OBST & OTH LUTS 10/16/2008   Neuropathy    Right groin pain    Dr. Andrey Campanile   SCC (squamous cell carcinoma) 06/2017   s/p Mohs---Dr Volanda Napoleon   Stroke [facility])    Wears partial dentures    lower partial   Past Surgical History:  Procedure Laterality Date   COLECTOMY  1990   HERNIA REPAIR Left 2014   [facility] rep w/mesh- March 2014   LOOP RECORDER INSERTION N/A 12/01/2016   Procedure: LOOP RECORDER INSERTION;  Surgeon: Marinus Maw, MD;  Location: [facility] INVASIVE CV LAB;  Service: Cardiovascular;  Laterality: N/A;   MITRAL VALVE ANNULOPLASTY     [facility] 2006   TEE WITHOUT CARDIOVERSION N/A 10/05/2016   Procedure: TRANSESOPHAGEAL ECHOCARDIOGRAM (TEE);  Surgeon: Chrystie Nose, MD;  Location: [facility] ENDOSCOPY;  Service: Cardiovascular;  Laterality: N/A;   Family History  Problem Relation Age of Onset   Colon cancer Other        M side (cousins)   Diabetes Neg Hx    Stroke Neg Hx    CAD Neg Hx    Prostate cancer Neg Hx    Rectal cancer Neg Hx    Esophageal cancer Neg Hx    Social History   Socioeconomic History   Marital status: Widowed    Spouse name: suzanne   Number of children: 2   Years of education: college   Highest education  level: Not on file  Occupational History   Occupation: retired   Tobacco Use   Smoking status: Former    Current packs/day: 0.00    Average packs/day: 0.5 packs/day for 20.0 years (10.0 ttl pk-yrs)    Types: Cigarettes    Start date: 02/23/1963    Quit date: 02/23/1983    Years since quitting: 39.8   Smokeless tobacco: Never  Vaping Use   Vaping status: Never Used  Substance and Sexual Activity   Alcohol use: Yes    Alcohol/week: 7.0 standard drinks of alcohol    Types: 7 Glasses of wine per week   Drug use: No   Sexual activity: Not Currently  Other Topics Concern   Not on file  Social History Narrative   From [place], lost wife 08/2017 to cancer   Lives by himself    Daughter in [place], 1 granddaughter   Son in [place], 2 grandchildren   Right handed      Social Determinants of Health   Financial Resource Strain: Low Risk  (11/29/2022)   Overall Financial Resource Strain (CARDIA)    Difficulty of Paying Living Expenses: Not very hard  Food Insecurity: No Food Insecurity (12/30/2022)   Hunger Vital Sign    Worried About Running Out of Food in the Last Year: Never true    Ran Out of Food in the Last Year: Never true  Transportation Needs: Unknown (11/29/2022)   PRAPARE - Administrator, Civil Service (Medical): Not on file    Lack of Transportation (Non-Medical): No  Physical Activity: Sufficiently Active (12/30/2022)   Exercise Vital Sign    Days of Exercise per Week: 7 days    Minutes of Exercise per Session: 30 min  Stress: No Stress Concern Present (12/30/2022)   [employer] of Occupational Health - Occupational Stress Questionnaire    Feeling of Stress : Not at all  Social Connections: Socially Isolated (12/30/2022)   Social Connection and Isolation Panel [NHANES]    Frequency of Communication with Friends and Family: More than three times a week    Frequency of Social Gatherings with Friends and Family: More than three times a week    Attends Religious  Services: Never    Database administrator or Organizations: No    Attends Banker Meetings: Never    Marital Status: Widowed    Tobacco Counseling Counseling given: Not Answered   Clinical Intake:  Pre-visit preparation completed: Yes  Pain : No/denies pain     BMI - recorded: 25.11 Nutritional Status: BMI 25 -29 Overweight Nutritional Risks: None Diabetes: No  How often do you need to have someone help you when you read instructions, pamphlets, or other written materials from your doctor or pharmacy?: 1 - Never  Interpreter Needed?: No  Information entered by :: Theresa Mulligan LPN   Activities of Daily Living    12/30/2022    2:44 PM  In your present state of health, do you have any difficulty performing the following activities:  Hearing? 0  Vision? 0  Difficulty concentrating or making decisions? 0  Walking or climbing stairs? 0  Dressing or bathing? 0  Doing errands, shopping? 0  Preparing Food and eating ? N  Using the Toilet? N  In the past six months, have you accidently leaked urine? N  Do you have problems with loss of bowel control? N  Managing your Medications? N  Managing your Finances? N  Housekeeping or managing your Housekeeping? N    Patient Care Team: Wanda Plump, MD as PCP - General Ladona Ridgel Doylene Canning, MD as PCP - Cardiology (Cardiology) Heloise Purpura, MD as Consulting Physician (Urology) Andria Frames, MD as Referring Physician (Cardiology) Arnetha Gula Lucious Groves, MD as Consulting Physician (Ophthalmology) Van Clines, MD as Consulting Physician (Neurology) Henrene Pastor, RPH-CPP (Pharmacist)  Indicate any recent Medical Services you may have received from other than [facility] providers in the past year (date may  be approximate).     Assessment:   This is a routine wellness examination for Dwyane.  Hearing/Vision screen Hearing Screening - Comments:: Denies hearing difficulties   Vision Screening - Comments:: Wears rx  glasses - up to date with routine eye exams with  Dr Arnetha Gula   Goals Addressed               This Visit's Progress     No current goals (pt-stated)         Depression Screen    12/30/2022    2:42 PM 12/01/2022   11:27 AM 07/16/2022    1:18 PM 05/10/2022    2:03 PM 03/19/2022   10:21 AM 12/30/2021    1:32 PM 12/15/2021    1:45 PM  PHQ 2/9 Scores  PHQ - 2 Score 0 0 1 1 0 0 0  PHQ- 9 Score 0 0  1  1     Fall Risk    12/30/2022    2:45 PM 12/01/2022   11:09 AM 07/26/2022    3:31 PM 07/16/2022    1:18 PM 05/10/2022    1:16 PM  Fall Risk   Falls in the past year? 0 0 0 0 0  Number falls in past yr: 0 0 0 0 0  Injury with Fall? 0 0 0 0 0  Risk for fall due to : No Fall Risks No Fall Risks     Follow up Falls prevention discussed Falls evaluation completed Falls evaluation completed Falls evaluation completed Falls evaluation completed    MEDICARE RISK AT HOME: Medicare Risk at Home Any stairs in or around the home?: Yes If so, are there any without handrails?: No Home free of loose throw rugs in walkways, pet beds, electrical cords, etc?: Yes Adequate lighting in your home to reduce risk of falls?: Yes Life alert?: No Use of a cane, walker or w/c?: No Grab bars in the bathroom?: No Shower chair or bench in shower?: Yes Elevated toilet seat or a handicapped toilet?: No  TIMED UP AND GO:  Was the test performed?  No    Cognitive Function:    12/15/2021    1:51 PM 03/02/2016    1:59 PM  MMSE - Mini Mental State Exam  Not completed: Refused   Orientation to time  5  Orientation to Place  5  Registration  3  Attention/ Calculation  5  Recall  2  Language- name 2 objects  2  Language- repeat  1  Language- follow 3 step command  3  Language- read & follow direction  1  Write a sentence  1  Copy design  1  Total score  29        12/30/2022    2:46 PM  6CIT Screen  What Year? 0 points  What month? 0 points  What time? 0 points  Count back from 20 0 points   Months in reverse 0 points  Repeat phrase 0 points  Total Score 0 points    Immunizations Immunization History  Administered Date(s) Administered   Fluad Quad(high Dose 65+) 11/13/2018, 12/12/2020, 11/25/2021   Fluad Trivalent(High Dose 65+) 12/01/2022   Influenza Whole 03/10/2007, 11/17/2007   Influenza-Unspecified 01/17/2017   PFIZER(Purple Top)SARS-COV-2 Vaccination 04/17/2019, 05/08/2019, 01/02/2020   Pneumococcal Conjugate-13 01/16/2014   Pneumococcal Polysaccharide-23 01/12/2005, 04/21/2010, 06/11/2020   Td 06/15/2006, 01/05/2015   Tdap 12/13/2016   Zoster Recombinant(Shingrix) 02/03/2017, 12/14/2018   Zoster, Live 03/25/1998    TDAP status: Up to date  Flu Vaccine status: Up to date  Pneumococcal vaccine status: Up to date  Covid-19 vaccine status: Declined, Education has been provided regarding the importance of this vaccine but patient still declined. Advised may receive this vaccine at local pharmacy or Health Dept.or vaccine clinic. Aware to provide a copy of the vaccination record if obtained from local pharmacy or Health Dept. Verbalized acceptance and understanding.  Qualifies for Shingles Vaccine? Yes   Zostavax completed Yes   Shingrix Completed?: Yes  Screening Tests Health Maintenance  Topic Date Due   COVID-19 Vaccine (4 - 2023-24 season) 10/24/2022   Medicare Annual Wellness (AWV)  12/30/2023   DTaP/Tdap/Td (4 - Td or Tdap) 12/14/2026   Pneumonia Vaccine 40+ Years old  Completed   INFLUENZA VACCINE  Completed   Zoster Vaccines- Shingrix  Completed   HPV VACCINES  Aged Out    Health Maintenance  Health Maintenance Due  Topic Date Due   COVID-19 Vaccine (4 - 2023-24 season) 10/24/2022         Additional Screening:    Vision Screening: Recommended annual ophthalmology exams for early detection of glaucoma and other disorders of the eye. Is the patient up to date with their annual eye exam?  Yes  Who is the provider or what is the name  of the office in which the patient attends annual eye exams? Dr Arnetha Gula If pt is not established with a provider, would they like to be referred to a provider to establish care? No .   Dental Screening: Recommended annual dental exams for proper oral hygiene    Community Resource Referral / Chronic Care Management:  CRR required this visit?  No   CCM required this visit?  No     Plan:     I have personally reviewed and noted the following in the patient's chart:   Medical and social history Use of alcohol, tobacco or illicit drugs  Current medications and supplements including opioid prescriptions. Patient is not currently taking opioid prescriptions. Functional ability and status Nutritional status Physical activity Advanced directives List of other physicians Hospitalizations, surgeries, and ER visits in previous 12 months Vitals Screenings to include cognitive, depression, and falls Referrals and appointments  In addition, I have reviewed and discussed with patient certain preventive protocols, quality metrics, and best practice recommendations. A written personalized care plan for preventive services as well as general preventive health recommendations were provided to patient.     Tillie Rung, LPN   16/02/958   After Visit Summary: (MyChart) Due to this being a telephonic visit, the after visit summary with patients personalized plan was offered to patient via MyChart   Nurse Notes: None

## 2022-12-30 NOTE — Patient Instructions (Addendum)
Mr. Depuy , Thank you for taking time to come for your Medicare Wellness Visit. I appreciate your ongoing commitment to your health goals. Please review the following plan we discussed and let me know if I can assist you in the future.   Referrals/Orders/Follow-Ups/Clinician Recommendations:   This is a list of the screening recommended for you and due dates:  Health Maintenance  Topic Date Due   COVID-19 Vaccine (4 - 2023-24 season) 10/24/2022   Medicare Annual Wellness Visit  12/30/2023   DTaP/Tdap/Td vaccine (4 - Td or Tdap) 12/14/2026   Pneumonia Vaccine  Completed   Flu Shot  Completed   Zoster (Shingles) Vaccine  Completed   HPV Vaccine  Aged Out    Advanced directives: (Copy Requested) Please bring a copy of your health care power of attorney and living will to the office to be added to your chart at your convenience.  Next Medicare Annual Wellness Visit scheduled for next year: Yes

## 2023-01-08 ENCOUNTER — Other Ambulatory Visit: Payer: Self-pay | Admitting: Internal Medicine

## 2023-01-24 ENCOUNTER — Telehealth: Payer: Self-pay | Admitting: Internal Medicine

## 2023-01-24 NOTE — Telephone Encounter (Signed)
Pt c/o BP issue: STAT if pt c/o blurred vision, one-sided weakness or slurred speech  1. What are your last 5 BP readings?   138/77  HR 69 - 8:45 am, 12/2 158/92  HR 63 - 5:00 pm,  12/1  2. Are you having any other symptoms (ex. Dizziness, headache, blurred vision, passed out)?    No  3. What is your BP issue?   Patient called again regarding his high BP readings.  Patient is concerned that he feels "something is rushing up to his head".  Patient wants to know next steps.

## 2023-01-24 NOTE — Telephone Encounter (Signed)
Patient is calling to talk with Dr. Rennis Golden or nurse regarding his BP

## 2023-01-24 NOTE — Telephone Encounter (Signed)
Spoke to patient he stated his B/P has been elevated ranging 158/92,156/84,160/84,156/90.Pulse 60 to 70.He is taking medications as listed in chart.Advised I will send message to Dr.Hilty.

## 2023-01-25 NOTE — Telephone Encounter (Signed)
Pt informed of providers result & recommendations. Pt verbalized understanding. All questions, if any, were answered.  He will change his current regimen as directed. Verbalized understanding.

## 2023-01-25 NOTE — Telephone Encounter (Signed)
Mr. Peete can take an extra 1/2 tablet (2.5 mg) amlodipine in addition to the 5 mg tablet for better BP control. Looks like it is listed that he takes it in the afternoon. Could take the 5 mg in the morning and 2.5 mg in the evening, instead.  Dr. Rennis Golden

## 2023-01-26 NOTE — Telephone Encounter (Signed)
Med list updated

## 2023-02-26 DIAGNOSIS — J40 Bronchitis, not specified as acute or chronic: Secondary | ICD-10-CM | POA: Diagnosis not present

## 2023-03-03 ENCOUNTER — Other Ambulatory Visit: Payer: Self-pay | Admitting: Internal Medicine

## 2023-03-22 ENCOUNTER — Telehealth: Payer: Self-pay | Admitting: Internal Medicine

## 2023-03-22 MED ORDER — AMLODIPINE BESYLATE 5 MG PO TABS: ORAL_TABLET | ORAL | 2 refills | Status: DC

## 2023-03-22 NOTE — Telephone Encounter (Signed)
*  STAT* If patient is at the pharmacy, call can be transferred to refill team.   1. Which medications need to be refilled? (please list name of each medication and dose if known) amLODipine (NORVASC) 5 MG tablet   2. Which pharmacy/location (including street and city if local pharmacy) is medication to be sent to? [id number] [place], [place] - [address] AT [facility] [phone]   3. Do they need a 30 day or 90 day supply? 90

## 2023-04-01 ENCOUNTER — Ambulatory Visit: Payer: PPO | Admitting: Neurology

## 2023-04-04 ENCOUNTER — Ambulatory Visit (INDEPENDENT_AMBULATORY_CARE_PROVIDER_SITE_OTHER): Payer: PPO | Admitting: Internal Medicine

## 2023-04-04 ENCOUNTER — Encounter: Payer: Self-pay | Admitting: Internal Medicine

## 2023-04-04 VITALS — BP 128/82 | HR 62 | Temp 97.5°F | Resp 18 | Ht 70.0 in | Wt 180.2 lb

## 2023-04-04 DIAGNOSIS — Z0000 Encounter for general adult medical examination without abnormal findings: Secondary | ICD-10-CM | POA: Diagnosis not present

## 2023-04-04 DIAGNOSIS — I10 Essential (primary) hypertension: Secondary | ICD-10-CM

## 2023-04-04 DIAGNOSIS — F32A Depression, unspecified: Secondary | ICD-10-CM | POA: Diagnosis not present

## 2023-04-04 DIAGNOSIS — F419 Anxiety disorder, unspecified: Secondary | ICD-10-CM | POA: Diagnosis not present

## 2023-04-04 DIAGNOSIS — Z0001 Encounter for general adult medical examination with abnormal findings: Secondary | ICD-10-CM

## 2023-04-04 DIAGNOSIS — E782 Mixed hyperlipidemia: Secondary | ICD-10-CM | POA: Diagnosis not present

## 2023-04-04 DIAGNOSIS — Z79899 Other long term (current) drug therapy: Secondary | ICD-10-CM

## 2023-04-04 LAB — ALT: ALT: 11 U/L (ref 0–53)

## 2023-04-04 LAB — BASIC METABOLIC PANEL
BUN: 17 mg/dL (ref 6–23)
CO2: 31 meq/L (ref 19–32)
Calcium: 9.2 mg/dL (ref 8.4–10.5)
Chloride: 97 meq/L (ref 96–112)
Creatinine, Ser: 1.03 mg/dL (ref 0.40–1.50)
GFR: 66.65 mL/min (ref >60.00)
Glucose, Bld: 97 mg/dL (ref 70–99)
Potassium: 4.8 meq/L (ref 3.5–5.1)
Sodium: 135 meq/L (ref 135–145)

## 2023-04-04 LAB — LIPID PANEL
Cholesterol: 137 mg/dL (ref 0–200)
HDL: 44.8 mg/dL
LDL Cholesterol: 71 mg/dL (ref 0–99)
NonHDL: 92.25
Total CHOL/HDL Ratio: 3
Triglycerides: 107 mg/dL (ref 0.0–149.0)
VLDL: 21.4 mg/dL (ref 0.0–40.0)

## 2023-04-04 LAB — AST: AST: 14 U/L (ref 0–37)

## 2023-04-04 NOTE — Patient Instructions (Addendum)
 Please read the information about the healthcare power of attorney below.  Vaccines I recommend; Covid booster RSV vaccine  Continue checking your blood pressure regularly Blood pressure goal:  between 110/65 and  135/85. If it is consistently higher or lower, let me know   GO TO THE LAB : Get the blood work     Next visit with me in 6 months. Please schedule it at the front desk     "Health Care Power of attorney" ,  "Living will" (Advance care planning documents)  If you already have a living will or healthcare power of attorney, is recommended you bring the copy to be scanned in your chart.   The document will be available to all the doctors you see in the system.  Advance care planning is a process that supports adults in  understanding and sharing their preferences regarding future medical care.  The patient's preferences are recorded in documents called Advance Directives and the can be modified at any time while the patient is in full mental capacity.   If you don't have one, please consider create one.      More information at: [email or web address]

## 2023-04-04 NOTE — Progress Notes (Signed)
 Subjective:    Patient ID: Kevin Bass, male    DOB: 1940/11/10, 83 y.o.   MRN: [id number]  DOS:  04/04/2023 Type of visit - description: Here for CPX  Here for CPX. In general feeling well. Occasionally his heart rate goes to the 120s, he typically rests, takes a Xanax  and  heart rate decreases. Denies chest pain or difficulty breathing No LOC   Review of Systems  Other than above, a 14 point review of systems is negative       Past Medical History:  Diagnosis Date   Allergy    Anxiety    Arthritis    hands   Atrial fibrillation (HCC)    Colon cancer (HCC)    s/p partial colectomy 1990, Cscope neg 7-09, next 2014   DISORDER, MITRAL VALVE    MV recontruction at [facility] 2006----still needs ABX for SBE prophylaxis    Elevated PSA    prostate nodule, Dr Rozanne Corners   GERD (gastroesophageal reflux disease)    Heart murmur    no problems since surgery   HYPERGLYCEMIA, BORDERLINE 10/16/2008   HYPERLIPIDEMIA 06/15/2006   diet controlled   Hypertension    HYPERTROPHY PROSTATE W/O UR OBST & OTH LUTS 10/16/2008   Neuropathy    Right groin pain    Dr. Elvan Hamel   SCC (squamous cell carcinoma) 06/2017   s/p Mohs---Dr Patsey Booth   Stroke [facility])    Wears partial dentures    lower partial    Past Surgical History:  Procedure Laterality Date   COLECTOMY  1990   HERNIA REPAIR Left 2014   [facility] rep w/mesh- March 2014   LOOP RECORDER INSERTION N/A 12/01/2016   Procedure: LOOP RECORDER INSERTION;  Surgeon: Tammie Fall, MD;  Location: [facility] INVASIVE CV LAB;  Service: Cardiovascular;  Laterality: N/A;   MITRAL VALVE ANNULOPLASTY     [facility] 2006   TEE WITHOUT CARDIOVERSION N/A 10/05/2016   Procedure: TRANSESOPHAGEAL ECHOCARDIOGRAM (TEE);  Surgeon: Hazle Lites, MD;  Location: [facility] ENDOSCOPY;  Service: Cardiovascular;  Laterality: N/A;   Social History   Socioeconomic History   Marital status: Widowed    Spouse name: suzanne   Number of children: 2   Years of education: college    Highest education level: Not on file  Occupational History   Occupation: retired   Tobacco Use   Smoking status: Former    Current packs/day: 0.00    Average packs/day: 0.5 packs/day for 20.0 years (10.0 ttl pk-yrs)    Types: Cigarettes    Start date: 02/23/1963    Quit date: 02/23/1983    Years since quitting: 40.1   Smokeless tobacco: Never  Vaping Use   Vaping status: Never Used  Substance and Sexual Activity   Alcohol use: Yes    Alcohol/week: 7.0 standard drinks of alcohol    Types: 7 Glasses of wine per week   Drug use: No   Sexual activity: Not Currently  Other Topics Concern   Not on file  Social History Narrative   From [place], lost wife 08/2017 to cancer   Lives by himself    Daughter in [place], 1 granddaughter   Son in [place], 2 grandchildren   Right handed      Social Drivers of Health   Financial Resource Strain: Low Risk  (11/29/2022)   Overall Financial Resource Strain (CARDIA)    Difficulty of Paying Living Expenses: Not very hard  Food Insecurity: No Food Insecurity (12/30/2022)   Hunger Vital Sign  Worried About Programme researcher, broadcasting/film/video in the Last Year: Never true    Ran Out of Food in the Last Year: Never true  Transportation Needs: Unknown (11/29/2022)   PRAPARE - Administrator, Civil Service (Medical): Not on file    Lack of Transportation (Non-Medical): No  Physical Activity: Sufficiently Active (12/30/2022)   Exercise Vital Sign    Days of Exercise per Week: 7 days    Minutes of Exercise per Session: 30 min  Stress: No Stress Concern Present (12/30/2022)   [employer] of Occupational Health - Occupational Stress Questionnaire    Feeling of Stress : Not at all  Social Connections: Socially Isolated (12/30/2022)   Social Connection and Isolation Panel [NHANES]    Frequency of Communication with Friends and Family: More than three times a week    Frequency of Social Gatherings with Friends and Family: More than three times a week    Attends  Religious Services: Never    Database administrator or Organizations: No    Attends Banker Meetings: Never    Marital Status: Widowed  Intimate Partner Violence: Not At Risk (12/30/2022)   Humiliation, Afraid, Rape, and Kick questionnaire    Fear of Current or Ex-Partner: No    Emotionally Abused: No    Physically Abused: No    Sexually Abused: No     Current Outpatient Medications  Medication Instructions   acetaminophen  (TYLENOL ) 1,300 mg, Every 8 hours PRN   ALPRAZolam  (XANAX ) 0.25 MG tablet TAKE 1 TABLET(0.25 MG) BY MOUTH TWICE DAILY AS NEEDED FOR ANXIETY   amLODipine  (NORVASC ) 5 MG tablet Take 5mg  by mouth in the morning and 2.5mg  by mouth in the evening.   Arginine  1,000 mg, Daily   Baclofen  5 MG TABS Take 3 times a day   Coenzyme Q10 100 MG TABS 1 tablet, 2 times daily   Eliquis  5 mg, Oral, 2 times daily   gabapentin  (NEURONTIN ) 300 MG capsule Take 2 capsules in AM and 3 capsules at night   Glucosamine-MSM-Hyaluronic Acd (JOINT HEALTH PO) 1 capsule, Daily   loratadine  (CLARITIN ) 10 mg, Daily at bedtime   metoprolol  succinate (TOPROL -XL) 75 mg, Oral, Daily, Take with or immediately following a meal   Multiple Vitamins-Minerals (OCUVITE PRESERVISION PO) 1 capsule, 2 times daily   nitroGLYCERIN  (NITROSTAT ) 0.4 mg, Sublingual, Every 5 min x3 PRN   NON FORMULARY 1-2 packets, See admin instructions   NON FORMULARY 10 mg, See admin instructions   Omega-3 Fatty Acids (FISH OIL PO) 1 capsule, 2 times daily   omeprazole  (PRILOSEC) 20 mg, Oral, Daily before breakfast   OXcarbazepine  (TRILEPTAL ) 150 MG tablet Take 1/2 tablet in AM, 1 tablet in PM   pravastatin  (PRAVACHOL ) 40 mg, Oral, Daily at bedtime   telmisartan  (MICARDIS ) 80 MG tablet TAKE 1 TABLET(80 MG) BY MOUTH DAILY   Vitamin D, Cholecalciferol, 25 MCG (1000 UT) TABS 1 tablet, Daily       Objective:   Physical Exam BP 128/82   Pulse 62   Temp (!) 97.5 F (36.4 C) (Oral)   Resp 18   Ht 5\' 10"  (1.778 m)    Wt 180 lb 4 oz (81.8 kg)   SpO2 94%   BMI 25.86 kg/m  General: Well developed, NAD, BMI noted Neck: No  thyromegaly  HEENT:  Normocephalic . Face symmetric, atraumatic Lungs:  CTA B Normal respiratory effort, no intercostal retractions, no accessory muscle use. Heart: RRR,  no murmur.  Abdomen:  Not distended, soft, non-tender. No rebound or rigidity.   Lower extremities: No pitting edema today.   Skin: Exposed areas without rash. Not pale. Not jaundice Neurologic:  alert & oriented X3.  Speech normal, gait appropriate for age and unassisted Strength symmetric and appropriate for age.  Psych: Cognition and judgment appear intact.  Cooperative with normal attention span and concentration.  Behavior appropriate. No anxious or depressed appearing.     Assessment    Problem list Prediabetes HTN -Intolerant to low-dose clonidine ,high-dose amlodipine ,HCTZ dizziness Hyperlipidemia, (intolerant to simva-pravachol : shoulder pain) Anxiety- rarely takes xanax  Neuro  -- Trigeminal neuralgia  dx 2015, no w/u, sx resurface x1 after temporary d/c of meds  --saw neuro again 02/2017, labs (-), had a NCS, Rx gaba CV: --Mitral valve annuloplasty 2006-- needs SBE prophylaxis -- SOB 04-2016, seen at [facility], ECHO Nl fx MV, cath: no CAD --Cryptogenic stroke 09/12/2016,  started plavix  -- new R PICA stroke,  11-30-16, implanted loop recorder placed --A-FIB noted on ILR ~ 12-21-2016, changed to eliquis   -- 11/2017:   L Hippocampus CVA with acute L posterior cerabral artery Elevated PSA and BPH-- Dr. Rozanne Corners Colon cancer partial colectomy 1990, SCC . MOHS 06/2017  PLAN: Here for CPX -Td 2018 - PNM 23: 2006, 2012, 2022;  prevnar--2015   - S/p Zostavax and  shingrix -Had a flu shot - Vaccines I recommend/discuss: RSV, COVID booster if not done in the last few months -h/o colon ca: Last Cscope-- 09-2012 neg, declines further scopes  -Has a prostate nodule, see previous entries, no further  eval -Labs: BMP, AST ALT FLP -Healthcare UA: Information provided Other issues addressed today: A-fib, Saw cardiology 12/22/2022, note reviewed: Labile BP noted, had episode of tachycardia, they are considering further evaluation if needed.  Subsequently he called them with elevated BP and was recommended to take a extra half tablet of amlodipine  5 mg.  Next cardiology visit  ~ April 2025 HTN: See above.   Currently on amlodipine  7.5 mg daily, metoprolol , Micardis , ambulatory BPs WNL except at nighttime:  BPs sometimes goes up to the 140s and 150.  For now recommend no change, high numbers are only sporadic.  Check BMP. High cholesterol: Based on last FLP, Pravachol  dose increased, check FLP AST ALT. Anxiety: On Xanax , no change.  Check UDS. Social: Lives independently, drives, take care of his own bills etc.  Just had a great grandchild. RTC 6 months

## 2023-04-04 NOTE — Assessment & Plan Note (Signed)
 Here for CPX Other issues addressed today: A-fib, Saw cardiology 12/22/2022, note reviewed: Labile BP noted, had episode of tachycardia, they are considering further evaluation if needed.  Subsequently he called them with elevated BP and was recommended to take a extra half tablet of amlodipine  5 mg.  Next cardiology visit  ~ April 2025 HTN: See above.   Currently on amlodipine  7.5 mg daily, metoprolol , Micardis , ambulatory BPs WNL except at nighttime:  BPs sometimes goes up to the 140s and 150.  For now recommend no change, high numbers are only sporadic.  Check BMP. High cholesterol: Based on last FLP, Pravachol  dose increased, check FLP AST ALT. Anxiety: On Xanax , no change.  Check UDS. Social: Lives independently, drives, take care of his own bills etc.  Just had a great grandchild. RTC 6 months

## 2023-04-04 NOTE — Assessment & Plan Note (Signed)
 Here for CPX -Td 2018 - PNM 23: 2006, 2012, 2022;  prevnar--2015   - S/p Zostavax and  shingrix -Had a flu shot - Vaccines I recommend/discuss: RSV, COVID booster if not done in the last few months -h/o colon ca: Last Cscope-- 09-2012 neg, declines further scopes  -Has a prostate nodule, see previous entries, no further eval -Labs: BMP, AST ALT FLP -Healthcare UA: Information provided

## 2023-04-06 ENCOUNTER — Encounter: Payer: Self-pay | Admitting: Internal Medicine

## 2023-04-06 LAB — DRUG MONITORING PANEL 375977 , URINE

## 2023-04-06 LAB — DM TEMPLATE

## 2023-04-09 ENCOUNTER — Encounter: Payer: Self-pay | Admitting: Internal Medicine

## 2023-04-11 MED ORDER — HYDROCORTISONE ACETATE 25 MG RE SUPP: 25.0000 mg | Freq: Two times a day (BID) | RECTAL | 1 refills | Status: DC | PRN

## 2023-05-04 ENCOUNTER — Ambulatory Visit: Payer: Self-pay | Admitting: Internal Medicine

## 2023-05-04 NOTE — Telephone Encounter (Signed)
 Chief Complaint: hemorrhoid pain Symptoms: pain, swelling, rectal bleeding Frequency: pain with BM, ongoing problem Pertinent Negatives: Patient denies fever, abd pain, SOB, CP, weakness/dizziness, pallor, large amount of red blood Disposition: [] ED /[] Urgent Care (no appt availability in office) / [x] Appointment(In office/virtual)/ []  [facility]/ [] Home Care/ [x] Refused Recommended Disposition /[] [facility]/ []  Follow-up with PCP Additional Notes: Pt reports hemorrhoid pain with swelling and rectal bleeding. Pt states this is an ongoing problem. Pt has been using prep H suppositories. States Dr. Drue Novel prescribed him hydrocortisone suppositories and he has been using those but they haven't been very helpful. Pt endorses intermittent rectal bleeding, denies large amount of blood or clots. Denies dizziness/lightheadedness, pallor. States pain is burning and worse with a BM. Pt denies constipation. Per protocol RN advised pt to be seen within 24 hours. No availability with dr. Drue Novel in that time frame. RN offered pt an appt at the office with a different provider. Pt only wants to see Dr. Drue Novel. RN called the CAL to inquire about availability. Dr. Drue Novel does not have appts until 3/24, CAL advised RN that RN can book pt for that appt. CAL also placed  pt on a waitlist. RN informed pt of his appt on 3/24 at 1420 and the waitlist. Pt verbalized understanding. RN advised pt it is important that if he loses a large amount of blood, clots, becomes weak or dizzy, he needs to go to the ED. For other worsening he needs to give [place] a call. Pt verbalized understanding.  Copied from CRM [id number]. Topic: Clinical - Red Word Triage >> May 04, 2023 10:45 AM [place] A wrote: [facility] that prompted transfer to Nurse Triage: Patient has hemorrhoids and pain Reason for Disposition  MODERATE-SEVERE rectal pain (i.e., interferes with school, work, or sleep)  Answer Assessment - Initial Assessment Questions 1.  SYMPTOM:  "What's the main symptom you're concerned about?" (e.g., pain, itching, swelling, rash)     Pain, swelling 2. ONSET: "When did the pain start?"     "Every time that I am having a BM it is burning", "it used to be occasional but it seems like it's getting to be every time" 3. RECTAL PAIN: "Do you have any pain around your rectum?" "How bad is the pain?"  (Scale 0-10; or mild, moderate, severe)   - NONE (0): no pain   - MILD (1-3): doesn't interfere with normal activities    - MODERATE (4-7): interferes with normal activities or awakens from sleep, limping    - SEVERE (8-10): excruciating pain, unable to have a bowel movement      "Burning pain", discomfort, maybe 5-6/10 4. RECTAL ITCHING: "Do you have any itching in this area?" "How bad is the itching?"  (Scale 0-10; or mild, moderate, severe)   - NONE: no itching   - MILD: doesn't interfere with normal activities    - MODERATE-SEVERE: interferes with normal activities or awakens from sleep     "Yes, unless I take a suppository" 5. CONSTIPATION: "Do you have constipation?" If Yes, ask: "How bad is it?"     "No, not really" 6. CAUSE: "What do you think is causing the anus symptoms?"     Hx of hemorrhoids  7. OTHER SYMPTOMS: "Do you have any other symptoms?"  (e.g., abdomen pain, fever, rectal bleeding, vomiting)     Some occasional rectal bleeding, "in order to ease the pain I use prep H suppository, every time that I have a BM I have to  use the suppository because I have burning pain if I don't", denies large amount of frank red blood, denies abd pain/fever/vomiting, Dr. Drue Novel prescribed hydrocortisone suppositories but pt states the prescription suppositories are not helping more than the prep H ones  Protocols used: Rectal Symptoms-A-AH

## 2023-05-16 ENCOUNTER — Encounter: Payer: Self-pay | Admitting: Internal Medicine

## 2023-05-16 ENCOUNTER — Ambulatory Visit (INDEPENDENT_AMBULATORY_CARE_PROVIDER_SITE_OTHER): Admitting: Internal Medicine

## 2023-05-16 ENCOUNTER — Telehealth: Payer: Self-pay | Admitting: Neurology

## 2023-05-16 VITALS — BP 126/80 | HR 59 | Temp 97.8°F | Resp 16 | Ht 70.0 in | Wt 178.1 lb

## 2023-05-16 DIAGNOSIS — Z85038 Personal history of other malignant neoplasm of large intestine: Secondary | ICD-10-CM

## 2023-05-16 DIAGNOSIS — K649 Unspecified hemorrhoids: Secondary | ICD-10-CM | POA: Diagnosis not present

## 2023-05-16 DIAGNOSIS — K648 Other hemorrhoids: Secondary | ICD-10-CM

## 2023-05-16 MED ORDER — HYDROCORTISONE ACETATE 25 MG RE SUPP: 25.0000 mg | Freq: Two times a day (BID) | RECTAL | 1 refills | Status: AC | PRN

## 2023-05-16 NOTE — Patient Instructions (Signed)
 Continue stool softener  Has a diet rich in fiber (fruits, vegetables, you could also take Metamucil as a supplement).  Use Anusol HC suppositories as needed  Call anytime if symptoms severe

## 2023-05-16 NOTE — Progress Notes (Unsigned)
 Subjective:    Patient ID: Kevin Bass, male    DOB: 05/02/1940, 83 y.o.   MRN: [id number]  DOS:  05/16/2023 Type of visit - description: acute   For the last 6 to 12 months, has noted blood in the stools after most bowel movements. The stools per se are brown, the blood is red. He thinks he has hemorrhoids so he is using OTC Preparation H .  He has chronically hard stools so he is also taking stool softener but denies diarrhea or constipation. No fever or chills. No abdominal pain. Admits to some burning and itching at the ano- rectal area after BMs.  Wt Readings from Last 3 Encounters:  05/16/23 178 lb 2 oz (80.8 kg)  04/04/23 180 lb 4 oz (81.8 kg)  12/30/22 175 lb (79.4 kg)    Review of Systems See above   Past Medical History:  Diagnosis Date   Allergy    Anxiety    Arthritis    hands   Atrial fibrillation (HCC)    Colon cancer (HCC)    s/p partial colectomy 1990, Cscope neg 7-09, next 2014   DISORDER, MITRAL VALVE    MV recontruction at [facility] 2006----still needs ABX for SBE prophylaxis    Elevated PSA    prostate nodule, Dr Laverle Patter   GERD (gastroesophageal reflux disease)    Heart murmur    no problems since surgery   HYPERGLYCEMIA, BORDERLINE 10/16/2008   HYPERLIPIDEMIA 06/15/2006   diet controlled   Hypertension    HYPERTROPHY PROSTATE W/O UR OBST & OTH LUTS 10/16/2008   Neuropathy    Right groin pain    Dr. Andrey Campanile   SCC (squamous cell carcinoma) 06/2017   s/p Mohs---Dr Volanda Napoleon   Stroke [facility])    Wears partial dentures    lower partial    Past Surgical History:  Procedure Laterality Date   COLECTOMY  1990   HERNIA REPAIR Left 2014   [facility] rep w/mesh- March 2014   LOOP RECORDER INSERTION N/A 12/01/2016   Procedure: LOOP RECORDER INSERTION;  Surgeon: Marinus Maw, MD;  Location: [facility] INVASIVE CV LAB;  Service: Cardiovascular;  Laterality: N/A;   MITRAL VALVE ANNULOPLASTY     [facility] 2006   TEE WITHOUT CARDIOVERSION N/A 10/05/2016   Procedure:  TRANSESOPHAGEAL ECHOCARDIOGRAM (TEE);  Surgeon: Chrystie Nose, MD;  Location: [facility] ENDOSCOPY;  Service: Cardiovascular;  Laterality: N/A;    Current Outpatient Medications  Medication Instructions   acetaminophen (TYLENOL) 1,300 mg, Every 8 hours PRN   ALPRAZolam (XANAX) 0.25 MG tablet TAKE 1 TABLET(0.25 MG) BY MOUTH TWICE DAILY AS NEEDED FOR ANXIETY   amLODipine (NORVASC) 5 MG tablet Take 5mg  by mouth in the morning and 2.5mg  by mouth in the evening.   Arginine 1,000 mg, Daily   Baclofen 5 MG TABS Take 3 times a day   Coenzyme Q10 100 MG TABS 1 tablet, 2 times daily   Eliquis 5 mg, Oral, 2 times daily   gabapentin (NEURONTIN) 300 MG capsule Take 2 capsules in AM and 3 capsules at night   Glucosamine-MSM-Hyaluronic Acd (JOINT HEALTH PO) 1 capsule, Daily   hydrocortisone (ANUSOL-HC) 25 mg, Rectal, 2 times daily PRN   loratadine (CLARITIN) 10 mg, Daily at bedtime   metoprolol succinate (TOPROL-XL) 75 mg, Oral, Daily, Take with or immediately following a meal   Multiple Vitamins-Minerals (OCUVITE PRESERVISION PO) 1 capsule, 2 times daily   nitroGLYCERIN (NITROSTAT) 0.4 mg, Sublingual, Every 5 min x3 PRN   NON FORMULARY 1-2  packets, See admin instructions   NON FORMULARY 10 mg, See admin instructions   Omega-3 Fatty Acids (FISH OIL PO) 1 capsule, 2 times daily   omeprazole (PRILOSEC) 20 mg, Oral, Daily before breakfast   OXcarbazepine (TRILEPTAL) 150 MG tablet Take 1/2 tablet in AM, 1 tablet in PM   pravastatin (PRAVACHOL) 40 mg, Oral, Daily at bedtime   telmisartan (MICARDIS) 80 MG tablet TAKE 1 TABLET(80 MG) BY MOUTH DAILY   Vitamin D, Cholecalciferol, 25 MCG (1000 UT) TABS 1 tablet, Daily       Objective:   Physical Exam BP 126/80   Pulse (!) 59   Temp 97.8 F (36.6 C) (Oral)   Resp 16   Ht 5\' 10"  (1.778 m)   Wt 178 lb 2 oz (80.8 kg)   SpO2 96%   BMI 25.56 kg/m  General:   Well developed, NAD, BMI noted.  HEENT:  Normocephalic . Face symmetric, atraumatic   Abdomen:   Not distended, soft, non-tender. No rebound or rigidity. DRE: Normal external examination, sphincter tone normal, prostate not enlarged.  Brown stools Anoscopy, procedure note: With the patient's verbal consent,  a well-lubricated self lighted anoscope was inserted.  Multiple small hemorrhoids noted.  No sign of bleeding, no polyps. Skin: Not pale. Not jaundice Lower extremities: no pretibial edema bilaterally  Neurologic:  alert & oriented X3.  Speech normal, gait appropriate for age and unassisted Psych--  Cognition and judgment appear intact.  Cooperative with normal attention span and concentration.  Behavior appropriate. No anxious or depressed appearing.     Assessment     Problem list Prediabetes HTN -Intolerant to low-dose clonidine,high-dose amlodipine,HCTZ dizziness Hyperlipidemia, (intolerant to simva-crestor - cause pain.  At some point was unable to tolerate pravastatin) Anxiety- rarely takes xanax Neuro  -- Trigeminal neuralgia  dx 2015, no w/u, sx resurface x1 after temporary d/c of meds  --saw neuro again 02/2017, labs (-), had a NCS, Rx gaba CV: --Mitral valve annuloplasty 2006-- needs SBE prophylaxis -- SOB 04-2016, seen at [facility], ECHO Nl fx MV, cath: no CAD --Cryptogenic stroke 09/12/2016,  started plavix -- new R PICA stroke,  11-30-16, implanted loop recorder placed --A-FIB noted on ILR ~ 12-21-2016, changed to eliquis  -- 11/2017:   L Hippocampus CVA with acute L posterior cerabral artery Elevated PSA and BPH-- Dr. Laverle Patter Colon cancer partial colectomy 1990, SCC . MOHS 06/2017  PLAN: Rectal bleeding, likely from internal hemorrhoids: Pt  is 83 y/o, had a partial colectomy in 1990 for colon cancer, last colonoscopy was 2014 and normal, since then he has declined further colonoscopies.  No weight loss, no anemia. Most likely etiology is bleeding from internal hemorrhoids, however due to his medical history, I offered a GI referral for consideration of a scope.   He understood my consideration but at the end we agreed to  continue with the stool softener, increase fiber intake, Anusol HC. Call anytime if symptoms increase or change.

## 2023-05-16 NOTE — Telephone Encounter (Signed)
 Copied from CRM [id number]. Topic: Clinical - Medication Question >> May 16, 2023  3:49 PM Florestine Avers wrote: Reason for CRM: Patient has a question about a medication that he was prescribed today by Dr. Drue Novel. Patient is requesting a call back to go over the medication.

## 2023-05-17 ENCOUNTER — Other Ambulatory Visit: Payer: Self-pay | Admitting: Internal Medicine

## 2023-05-17 DIAGNOSIS — I480 Paroxysmal atrial fibrillation: Secondary | ICD-10-CM

## 2023-05-17 NOTE — Assessment & Plan Note (Signed)
 Rectal bleeding, likely from internal hemorrhoids: Pt  is 83 y/o, had a partial colectomy in 1990 for colon cancer, last colonoscopy was 2014 and normal, since then he has declined further colonoscopies.  No weight loss, no anemia. Most likely etiology is bleeding from internal hemorrhoids, however due to his medical history, I offered a GI referral for consideration of a scope.  He understood my consideration but at the end we agreed to  continue with the stool softener, increase fiber intake, Anusol HC. Call anytime if symptoms increase or change.

## 2023-05-17 NOTE — Telephone Encounter (Signed)
 He had a question about Preparation H versus Anusol HC suppositories.  We agreed that he could use Preparation H if that works for him and keep Anusol suppositories for as-needed basis

## 2023-05-17 NOTE — Telephone Encounter (Signed)
 Eliquis 5mg  refill request received. Patient is 83 years old, weight-80.8kg, Crea- 1.03 on 04/04/23, Diagnosis-Afib, and last seen by Dr. Rennis Golden on 12/22/22. Dose is appropriate based on dosing criteria. Will send in refill to requested pharmacy.

## 2023-05-27 ENCOUNTER — Telehealth: Payer: Self-pay

## 2023-05-27 NOTE — Telephone Encounter (Signed)
 Copied from CRM [id number]. Topic: General - Billing Inquiry >> May 27, 2023 11:07 AM Fredrich Romans wrote: Reason for CRM: [facility] and he's never received a bill before for labs.He would like to know has something changed as far as where labs are sent or is there an error?

## 2023-06-22 DIAGNOSIS — H43821 Vitreomacular adhesion, right eye: Secondary | ICD-10-CM | POA: Diagnosis not present

## 2023-06-22 DIAGNOSIS — H43812 Vitreous degeneration, left eye: Secondary | ICD-10-CM | POA: Diagnosis not present

## 2023-06-22 DIAGNOSIS — H5203 Hypermetropia, bilateral: Secondary | ICD-10-CM | POA: Diagnosis not present

## 2023-06-22 DIAGNOSIS — G500 Trigeminal neuralgia: Secondary | ICD-10-CM | POA: Diagnosis not present

## 2023-06-22 DIAGNOSIS — H02052 Trichiasis without entropian right lower eyelid: Secondary | ICD-10-CM | POA: Diagnosis not present

## 2023-06-22 DIAGNOSIS — H35362 Drusen (degenerative) of macula, left eye: Secondary | ICD-10-CM | POA: Diagnosis not present

## 2023-06-22 DIAGNOSIS — H25043 Posterior subcapsular polar age-related cataract, bilateral: Secondary | ICD-10-CM | POA: Diagnosis not present

## 2023-06-22 DIAGNOSIS — H2513 Age-related nuclear cataract, bilateral: Secondary | ICD-10-CM | POA: Diagnosis not present

## 2023-06-22 DIAGNOSIS — H52203 Unspecified astigmatism, bilateral: Secondary | ICD-10-CM | POA: Diagnosis not present

## 2023-06-22 DIAGNOSIS — H02055 Trichiasis without entropian left lower eyelid: Secondary | ICD-10-CM | POA: Diagnosis not present

## 2023-06-22 DIAGNOSIS — H524 Presbyopia: Secondary | ICD-10-CM | POA: Diagnosis not present

## 2023-07-01 DIAGNOSIS — I83891 Varicose veins of right lower extremities with other complications: Secondary | ICD-10-CM | POA: Diagnosis not present

## 2023-07-04 ENCOUNTER — Telehealth: Payer: Self-pay | Admitting: Internal Medicine

## 2023-07-05 NOTE — Telephone Encounter (Signed)
 Requesting: alprazolam  0.25mg   Contract: 04/13/22 UDS: 04/04/23 Last Visit: 05/16/23 Next Visit: 10/03/23 Last Refill: 11/15/22 #60 and 1RF   Please Advise

## 2023-07-05 NOTE — Telephone Encounter (Signed)
 PDMP okay, Rx sent

## 2023-07-15 ENCOUNTER — Encounter: Payer: Self-pay | Admitting: Pharmacist

## 2023-07-15 NOTE — Progress Notes (Signed)
 Pharmacy Quality Measure Review  This patient is appearing on a report for being at risk of failing the adherence measure for cholesterol (statin) and hypertension (ACEi/ARB) medications this calendar year.   Medication: telmisartan  80mg   Last fill date: 06/28/2023 for 90 day supply (also filled 03/21/2023 for 90 days)  Medication: pravastatin  40mg  Last fill date: 12/03/2022 for 90 day supply  Reviewed medication indication, dosing, and goals of therapy. Verified there is 1 refill of 90 days remaining.  Patient states he is due to refill pravastatin  and will call pharmacy.   Cecilie Coffee, PharmD Clinical Pharmacist [facility]  Population Health [phone]

## 2023-07-28 DIAGNOSIS — R0982 Postnasal drip: Secondary | ICD-10-CM | POA: Diagnosis not present

## 2023-07-28 DIAGNOSIS — R051 Acute cough: Secondary | ICD-10-CM | POA: Diagnosis not present

## 2023-07-28 DIAGNOSIS — R070 Pain in throat: Secondary | ICD-10-CM | POA: Diagnosis not present

## 2023-07-29 DIAGNOSIS — R519 Headache, unspecified: Secondary | ICD-10-CM | POA: Diagnosis not present

## 2023-07-29 DIAGNOSIS — I959 Hypotension, unspecified: Secondary | ICD-10-CM | POA: Diagnosis not present

## 2023-08-14 ENCOUNTER — Other Ambulatory Visit: Payer: Self-pay | Admitting: Neurology

## 2023-08-18 ENCOUNTER — Other Ambulatory Visit: Payer: Self-pay | Admitting: Internal Medicine

## 2023-09-03 ENCOUNTER — Encounter (HOSPITAL_BASED_OUTPATIENT_CLINIC_OR_DEPARTMENT_OTHER): Payer: Self-pay | Admitting: Emergency Medicine

## 2023-09-03 ENCOUNTER — Other Ambulatory Visit: Payer: Self-pay | Admitting: Internal Medicine

## 2023-09-03 ENCOUNTER — Emergency Department (HOSPITAL_BASED_OUTPATIENT_CLINIC_OR_DEPARTMENT_OTHER)
Admission: EM | Admit: 2023-09-03 | Discharge: 2023-09-04 | Disposition: A | Discharge status: Home / Self Care | Attending: Emergency Medicine | Admitting: Emergency Medicine

## 2023-09-03 ENCOUNTER — Other Ambulatory Visit: Payer: Self-pay

## 2023-09-03 ENCOUNTER — Emergency Department (HOSPITAL_BASED_OUTPATIENT_CLINIC_OR_DEPARTMENT_OTHER)

## 2023-09-03 DIAGNOSIS — Z8673 Personal history of transient ischemic attack (TIA), and cerebral infarction without residual deficits: Secondary | ICD-10-CM | POA: Insufficient documentation

## 2023-09-03 DIAGNOSIS — J9811 Atelectasis: Secondary | ICD-10-CM | POA: Diagnosis not present

## 2023-09-03 DIAGNOSIS — I4891 Unspecified atrial fibrillation: Secondary | ICD-10-CM | POA: Insufficient documentation

## 2023-09-03 DIAGNOSIS — Z79899 Other long term (current) drug therapy: Secondary | ICD-10-CM | POA: Diagnosis not present

## 2023-09-03 DIAGNOSIS — R600 Localized edema: Secondary | ICD-10-CM | POA: Diagnosis present

## 2023-09-03 DIAGNOSIS — I10 Essential (primary) hypertension: Secondary | ICD-10-CM | POA: Insufficient documentation

## 2023-09-03 DIAGNOSIS — Z7901 Long term (current) use of anticoagulants: Secondary | ICD-10-CM | POA: Diagnosis not present

## 2023-09-03 DIAGNOSIS — R000 Tachycardia, unspecified: Secondary | ICD-10-CM | POA: Diagnosis present

## 2023-09-03 DIAGNOSIS — I700 Atherosclerosis of aorta: Secondary | ICD-10-CM | POA: Diagnosis not present

## 2023-09-03 DIAGNOSIS — R0602 Shortness of breath: Secondary | ICD-10-CM | POA: Diagnosis not present

## 2023-09-03 LAB — CBC WITH DIFFERENTIAL/PLATELET
Abs Immature Granulocytes: 0.01 K/uL (ref 0.00–0.07)
Basophils Absolute: 0.1 K/uL (ref 0.0–0.1)
Basophils Relative: 1 %
Eosinophils Absolute: 0.3 K/uL (ref 0.0–0.5)
Eosinophils Relative: 4 %
HCT: 40.4 % (ref 39.0–52.0)
Hemoglobin: 13.9 g/dL (ref 13.0–17.0)
Immature Granulocytes: 0 %
Lymphocytes Relative: 31 %
Lymphs Abs: 1.8 K/uL (ref 0.7–4.0)
MCH: 28.6 pg (ref 26.0–34.0)
MCHC: 34.4 g/dL (ref 30.0–36.0)
MCV: 83.1 fL (ref 80.0–100.0)
Monocytes Absolute: 0.5 K/uL (ref 0.1–1.0)
Monocytes Relative: 8 %
Neutro Abs: 3.3 K/uL (ref 1.7–7.7)
Neutrophils Relative %: 56 %
Platelets: 244 K/uL (ref 150–400)
RBC: 4.86 MIL/uL (ref 4.22–5.81)
RDW: 13.2 % (ref 11.5–15.5)
WBC: 5.9 K/uL (ref 4.0–10.5)
nRBC: 0 % (ref 0.0–0.2)

## 2023-09-03 LAB — COMPREHENSIVE METABOLIC PANEL WITH GFR
ALT: 18 U/L (ref 0–44)
AST: 23 U/L (ref 15–41)
Albumin: 4.5 g/dL (ref 3.5–5.0)
Alkaline Phosphatase: 71 U/L (ref 38–126)
Anion gap: 11 (ref 5–15)
BUN: 19 mg/dL (ref 8–23)
CO2: 27 mmol/L (ref 22–32)
Calcium: 9.5 mg/dL (ref 8.9–10.3)
Chloride: 96 mmol/L — ABNORMAL LOW (ref 98–111)
Creatinine, Ser: 0.98 mg/dL (ref 0.61–1.24)
GFR, Estimated: >60 mL/min (ref >60)
Glucose, Bld: 137 mg/dL — ABNORMAL HIGH (ref 70–99)
Potassium: 4.2 mmol/L (ref 3.5–5.1)
Sodium: 134 mmol/L — ABNORMAL LOW (ref 135–145)
Total Bilirubin: 0.6 mg/dL (ref 0.0–1.2)
Total Protein: 7.1 g/dL (ref 6.5–8.1)

## 2023-09-03 LAB — PRO BRAIN NATRIURETIC PEPTIDE: Pro Brain Natriuretic Peptide: 379 pg/mL — ABNORMAL HIGH (ref <300.0)

## 2023-09-03 LAB — TROPONIN T, HIGH SENSITIVITY: Troponin T High Sensitivity: <15 ng/L (ref <19)

## 2023-09-03 MED ORDER — METOPROLOL TARTRATE 5 MG/5ML IV SOLN: 5.0000 mg | Freq: Once | INTRAVENOUS | Status: DC

## 2023-09-03 NOTE — ED Provider Notes (Signed)
 [place] [facility] DEPARTMENT AT [facility] Provider Note   CSN: [id number] Arrival date & time: 09/03/23  2122     Patient presents with: Tachycardia   Kevin Bass is a 83 y.o. male.  {Add pertinent medical, surgical, social history, OB history to YEP:67052} The history is provided by the patient, a relative and medical records.  Kevin Bass is a 83 y.o. male who presents to the Emergency Department complaining of palpitations.  He presents the emergency department accompanied by his daughter for evaluation palpitations and rapid heart rate that has been fluctuating over the last week.  He reports increased chest tightness whenever his heart rate is elevated and this is significantly worsened over the last several days.  He used to have symptoms about once a week and it would resolve with Xanax  but his symptoms have been more persistent lately.  No associated fever, shortness of breath, vomiting, diarrhea, dysuria.  He does report lower extremity edema but this is ongoing issue and unchanged baseline.   Hx/o HTN, mitral valve repair, paroxsymal afib on eliquis .     Prior to Admission medications   Medication Sig Start Date End Date Taking? Authorizing Provider  acetaminophen  (TYLENOL ) 650 MG CR tablet Take 1,300 mg by mouth every 8 (eight) hours as needed for pain.    [provider]  ALPRAZolam  (XANAX ) 0.25 MG tablet Take 1 tablet (0.25 mg total) by mouth 2 (two) times daily as needed for anxiety. 07/05/23   Amon Aloysius BRAVO, MD  amLODipine  (NORVASC ) 5 MG tablet Take 5mg  by mouth in the morning and 2.5mg  by mouth in the evening. 03/22/23   Hilty, Vinie BROCKS, MD  Arginine  500 MG CAPS Take 1,000 mg by mouth daily.     [provider]  Baclofen  5 MG TABS TAKE 1 TABLET BY MOUTH THREE TIMES DAILY 08/15/23   Georjean Darice HERO, MD  Coenzyme Q10 100 MG TABS Take 1 tablet by mouth in the morning and at bedtime.    [provider]  ELIQUIS  5 MG TABS  tablet TAKE 1 TABLET BY MOUTH TWICE DAILY 05/17/23   Hilty, Vinie BROCKS, MD  gabapentin  (NEURONTIN ) 300 MG capsule Take 2 capsules in AM and 3 capsules at night 07/26/22   Georjean Darice HERO, MD  Glucosamine-MSM-Hyaluronic Acd (JOINT HEALTH PO) Take 1 capsule by mouth daily.     [provider]  hydrocortisone  (ANUSOL -HC) 25 MG suppository Place 1 suppository (25 mg total) rectally 2 (two) times daily as needed for hemorrhoids. 05/16/23   Amon Aloysius BRAVO, MD  loratadine  (CLARITIN ) 10 MG tablet Take 10 mg by mouth at bedtime.    [provider]  metoprolol  succinate (TOPROL -XL) 25 MG 24 hr tablet Take 3 tablets (75 mg total) by mouth daily. Take with or immediately following a meal 03/03/23   [name], Jose E, MD  Multiple Vitamins-Minerals (OCUVITE PRESERVISION PO) Take 1 capsule by mouth 2 (two) times daily.     [provider]  nitroGLYCERIN  (NITROSTAT ) 0.4 MG SL tablet Place 1 tablet (0.4 mg total) under the tongue every 5 (five) minutes x 3 doses as needed for chest pain. Patient not taking: Reported on 05/16/2023 12/30/21   [name], Jose E, MD  NON FORMULARY Take 1-2 packets by mouth See admin instructions. Lypo-Spheric vitamin C gel packets- Mix 1-2 packets of gel into juice one to two times a day and drink    [provider]  NON FORMULARY Take 10 mg by mouth See admin instructions.  [employer] Lyco-Sorb (for Prostate & Cardiovascular Health) 10 mg capsules: Take 10 mg by mouth two times a day    [provider]  Omega-3 Fatty Acids (FISH OIL PO) Take 1 capsule by mouth in the morning and at bedtime.    [provider]  omeprazole  (PRILOSEC) 20 MG capsule Take 1 capsule (20 mg total) by mouth daily before breakfast. 08/18/23   Amon Aloysius BRAVO, MD  OXcarbazepine  (TRILEPTAL ) 150 MG tablet Take 1/2 tablet in AM, 1 tablet in PM 07/26/22   Georjean Darice HERO, MD  pravastatin  (PRAVACHOL ) 40 MG tablet Take 1 tablet (40 mg total) by mouth at bedtime. 12/03/22   Amon Aloysius BRAVO, MD   telmisartan  (MICARDIS ) 80 MG tablet TAKE 1 TABLET(80 MG) BY MOUTH DAILY 01/11/23   Hilty, Vinie BROCKS, MD  Vitamin D, Cholecalciferol, 25 MCG (1000 UT) TABS Take 1 tablet by mouth daily.    [provider]    Allergies: Prednisolone acetate, Rosuvastatin , and Statins    Review of Systems  All other systems reviewed and are negative.   Updated Vital Signs BP (!) 151/92   Pulse 91   Temp 97.7 F (36.5 C)   Resp 15   Ht 5' 10 (1.778 m)   Wt 77.1 kg   SpO2 96%   BMI 24.39 kg/m   Physical Exam Vitals and nursing note reviewed.  Constitutional:      Appearance: He is well-developed.  HENT:     Head: Normocephalic and atraumatic.  Cardiovascular:     Rate and Rhythm: Normal rate. Rhythm irregular.     Heart sounds: No murmur heard. Pulmonary:     Effort: Pulmonary effort is normal. No respiratory distress.     Breath sounds: Normal breath sounds.  Abdominal:     Palpations: Abdomen is soft.     Tenderness: There is no abdominal tenderness. There is no guarding or rebound.  Musculoskeletal:        General: No tenderness.     Comments: Trace pitting edema to BLE  Skin:    General: Skin is warm and dry.  Neurological:     Mental Status: He is alert and oriented to person, place, and time.  Psychiatric:        Behavior: Behavior normal.     (all labs ordered are listed, but only abnormal results are displayed) Labs Reviewed  CBC WITH DIFFERENTIAL/PLATELET  COMPREHENSIVE METABOLIC PANEL WITH GFR  PRO BRAIN NATRIURETIC PEPTIDE  TROPONIN T, HIGH SENSITIVITY    EKG: EKG Interpretation Date/Time:  Saturday September 03 2023 21:35:11 EDT Ventricular Rate:  123 PR Interval:    QRS Duration:  91 QT Interval:  309 QTC Calculation: 442 R Axis:   256  Text Interpretation: Atrial fibrillation LAD, consider left anterior fascicular block Low voltage, extremity leads Consider anterior infarct Since prior ECG, rate has increased, no longer appears sinus Confirmed by  Dreama Longs ([id number]) on 09/03/2023 9:53:14 PM  Radiology: [facility] Chest Portable 1 View Result Date: 09/03/2023 CLINICAL DATA:  Shortness of breath EXAM: PORTABLE CHEST 1 VIEW COMPARISON:  11/22/2021 FINDINGS: Cardiac shadow is mildly prominent but stable. Postsurgical changes are noted. Loop recorder is seen. Aortic calcifications are noted. The lungs are well aerated bilaterally. Mild left basilar atelectasis is seen. No focal infiltrate is noted. No bony abnormality is seen. IMPRESSION: Left basilar atelectasis. Electronically Signed   By: Oneil Devonshire M.D.   On: 09/03/2023 22:02    {Document cardiac monitor, telemetry assessment procedure when appropriate:32947}  Procedures   Medications Ordered in the ED - No data to display    {Click here for ABCD2, HEART and other calculators REFRESH Note before signing:1}                              Medical Decision Making Amount and/or Complexity of Data Reviewed Labs: ordered. Radiology: ordered.   ***  {Document critical care time when appropriate  Document review of labs and clinical decision tools ie CHADS2VASC2, etc  Document your independent review of radiology images and any outside records  Document your discussion with family members, caretakers and with consultants  Document social determinants of health affecting pt's care  Document your decision making why or why not admission, treatments were needed:32947:::1}   Final diagnoses:  None    ED Discharge Orders     None

## 2023-09-03 NOTE — ED Triage Notes (Signed)
 Pt reports rapid HR intermittently x 1 wk, worse today; c/o chest tightness

## 2023-09-04 NOTE — ED Notes (Signed)
 EDP discontinued 2nd trop.

## 2023-09-04 NOTE — Discharge Instructions (Addendum)
 You can increase your metoprolol  to 100mg  daily.

## 2023-09-05 ENCOUNTER — Telehealth: Payer: Self-pay | Admitting: Internal Medicine

## 2023-09-05 NOTE — Telephone Encounter (Signed)
 Call to patient to discuss his concerns about symptoms/recent ED visit for rapid afib. No answer, left message per DPR asking patient to call our office. Discharge paperwork indicates he was referred to afib clinic.

## 2023-09-05 NOTE — Telephone Encounter (Signed)
 STAT if HR is under 50 or over 120 (normal HR is 60-100 beats per minute)  What is your heart rate? 70  Do you have a log of your heart rate readings (document readings)? 46,60, 63, 68, 150  Do you have any other symptoms? Chest discomfort Pt went to [facility] ER in [facility] [place] [facility] on Sat Night

## 2023-09-06 ENCOUNTER — Emergency Department (HOSPITAL_BASED_OUTPATIENT_CLINIC_OR_DEPARTMENT_OTHER)

## 2023-09-06 ENCOUNTER — Other Ambulatory Visit: Payer: Self-pay

## 2023-09-06 ENCOUNTER — Ambulatory Visit (HOSPITAL_COMMUNITY): Admitting: Physician Assistant

## 2023-09-06 ENCOUNTER — Emergency Department (HOSPITAL_COMMUNITY)

## 2023-09-06 ENCOUNTER — Emergency Department (HOSPITAL_BASED_OUTPATIENT_CLINIC_OR_DEPARTMENT_OTHER)
Admission: EM | Admit: 2023-09-06 | Discharge: 2023-09-06 | Disposition: A | Discharge status: Home / Self Care | Attending: Emergency Medicine | Admitting: Emergency Medicine

## 2023-09-06 DIAGNOSIS — R519 Headache, unspecified: Secondary | ICD-10-CM | POA: Diagnosis not present

## 2023-09-06 DIAGNOSIS — R29818 Other symptoms and signs involving the nervous system: Secondary | ICD-10-CM | POA: Diagnosis not present

## 2023-09-06 DIAGNOSIS — I4891 Unspecified atrial fibrillation: Secondary | ICD-10-CM | POA: Diagnosis not present

## 2023-09-06 DIAGNOSIS — E878 Other disorders of electrolyte and fluid balance, not elsewhere classified: Secondary | ICD-10-CM | POA: Insufficient documentation

## 2023-09-06 DIAGNOSIS — E871 Hypo-osmolality and hyponatremia: Secondary | ICD-10-CM | POA: Insufficient documentation

## 2023-09-06 DIAGNOSIS — I6501 Occlusion and stenosis of right vertebral artery: Secondary | ICD-10-CM | POA: Diagnosis not present

## 2023-09-06 DIAGNOSIS — I10 Essential (primary) hypertension: Secondary | ICD-10-CM | POA: Insufficient documentation

## 2023-09-06 DIAGNOSIS — Z7901 Long term (current) use of anticoagulants: Secondary | ICD-10-CM | POA: Diagnosis not present

## 2023-09-06 DIAGNOSIS — R0602 Shortness of breath: Secondary | ICD-10-CM | POA: Diagnosis not present

## 2023-09-06 DIAGNOSIS — Z79899 Other long term (current) drug therapy: Secondary | ICD-10-CM | POA: Insufficient documentation

## 2023-09-06 DIAGNOSIS — G4489 Other headache syndrome: Secondary | ICD-10-CM | POA: Diagnosis not present

## 2023-09-06 DIAGNOSIS — R4182 Altered mental status, unspecified: Secondary | ICD-10-CM | POA: Diagnosis present

## 2023-09-06 DIAGNOSIS — R479 Unspecified speech disturbances: Secondary | ICD-10-CM | POA: Diagnosis not present

## 2023-09-06 DIAGNOSIS — Z8673 Personal history of transient ischemic attack (TIA), and cerebral infarction without residual deficits: Secondary | ICD-10-CM | POA: Diagnosis not present

## 2023-09-06 DIAGNOSIS — I6529 Occlusion and stenosis of unspecified carotid artery: Secondary | ICD-10-CM | POA: Diagnosis not present

## 2023-09-06 DIAGNOSIS — G9389 Other specified disorders of brain: Secondary | ICD-10-CM | POA: Diagnosis not present

## 2023-09-06 DIAGNOSIS — R202 Paresthesia of skin: Secondary | ICD-10-CM | POA: Diagnosis not present

## 2023-09-06 DIAGNOSIS — R9082 White matter disease, unspecified: Secondary | ICD-10-CM | POA: Diagnosis not present

## 2023-09-06 DIAGNOSIS — R410 Disorientation, unspecified: Secondary | ICD-10-CM | POA: Diagnosis not present

## 2023-09-06 DIAGNOSIS — I6603 Occlusion and stenosis of bilateral middle cerebral arteries: Secondary | ICD-10-CM | POA: Diagnosis not present

## 2023-09-06 DIAGNOSIS — R42 Dizziness and giddiness: Secondary | ICD-10-CM | POA: Diagnosis not present

## 2023-09-06 DIAGNOSIS — I672 Cerebral atherosclerosis: Secondary | ICD-10-CM | POA: Diagnosis not present

## 2023-09-06 LAB — URINALYSIS, ROUTINE W REFLEX MICROSCOPIC
Bilirubin Urine: NEGATIVE
Glucose, UA: NEGATIVE mg/dL
Hgb urine dipstick: NEGATIVE
Ketones, ur: NEGATIVE mg/dL
Leukocytes,Ua: NEGATIVE
Nitrite: NEGATIVE
Protein, ur: 30 mg/dL — AB
Specific Gravity, Urine: 1.02 (ref 1.005–1.030)
pH: 6 (ref 5.0–8.0)

## 2023-09-06 LAB — DIFFERENTIAL
Abs Immature Granulocytes: 0.03 K/uL (ref 0.00–0.07)
Basophils Absolute: 0.1 K/uL (ref 0.0–0.1)
Basophils Relative: 1 %
Eosinophils Absolute: 0.1 K/uL (ref 0.0–0.5)
Eosinophils Relative: 2 %
Immature Granulocytes: 0 %
Lymphocytes Relative: 23 %
Lymphs Abs: 1.6 K/uL (ref 0.7–4.0)
Monocytes Absolute: 0.5 K/uL (ref 0.1–1.0)
Monocytes Relative: 7 %
Neutro Abs: 4.5 K/uL (ref 1.7–7.7)
Neutrophils Relative %: 67 %

## 2023-09-06 LAB — COMPREHENSIVE METABOLIC PANEL WITH GFR
ALT: 16 U/L (ref 0–44)
AST: 21 U/L (ref 15–41)
Albumin: 4.5 g/dL (ref 3.5–5.0)
Alkaline Phosphatase: 63 U/L (ref 38–126)
Anion gap: 9 (ref 5–15)
BUN: 21 mg/dL (ref 8–23)
CO2: 28 mmol/L (ref 22–32)
Calcium: 9.6 mg/dL (ref 8.9–10.3)
Chloride: 96 mmol/L — ABNORMAL LOW (ref 98–111)
Creatinine, Ser: 1.24 mg/dL (ref 0.61–1.24)
GFR, Estimated: 57 mL/min — ABNORMAL LOW (ref >60)
Glucose, Bld: 104 mg/dL — ABNORMAL HIGH (ref 70–99)
Potassium: 4.6 mmol/L (ref 3.5–5.1)
Sodium: 133 mmol/L — ABNORMAL LOW (ref 135–145)
Total Bilirubin: 0.6 mg/dL (ref 0.0–1.2)
Total Protein: 7.1 g/dL (ref 6.5–8.1)

## 2023-09-06 LAB — APTT: aPTT: 34 s (ref 24–36)

## 2023-09-06 LAB — URINALYSIS, MICROSCOPIC (REFLEX): WBC, UA: NONE SEEN WBC/hpf (ref 0–5)

## 2023-09-06 LAB — CBC
HCT: 39.4 % (ref 39.0–52.0)
Hemoglobin: 13.3 g/dL (ref 13.0–17.0)
MCH: 28.5 pg (ref 26.0–34.0)
MCHC: 33.8 g/dL (ref 30.0–36.0)
MCV: 84.4 fL (ref 80.0–100.0)
Platelets: 237 K/uL (ref 150–400)
RBC: 4.67 MIL/uL (ref 4.22–5.81)
RDW: 13.6 % (ref 11.5–15.5)
WBC: 6.8 K/uL (ref 4.0–10.5)
nRBC: 0 % (ref 0.0–0.2)

## 2023-09-06 LAB — URINE DRUG SCREEN
Amphetamines: NOT DETECTED
Barbiturates: NOT DETECTED
Benzodiazepines: NOT DETECTED
Cocaine: NOT DETECTED
Fentanyl: NOT DETECTED
Methadone Scn, Ur: NOT DETECTED
Opiates: NOT DETECTED
Tetrahydrocannabinol: NOT DETECTED

## 2023-09-06 LAB — ETHANOL: Alcohol, Ethyl (B): <15 mg/dL (ref <15)

## 2023-09-06 LAB — PROTIME-INR
INR: 1.4 — ABNORMAL HIGH (ref 0.8–1.2)
Prothrombin Time: 17.7 s — ABNORMAL HIGH (ref 11.4–15.2)

## 2023-09-06 LAB — CBG MONITORING, ED: Glucose-Capillary: 99 mg/dL (ref 70–99)

## 2023-09-06 MED ORDER — IOHEXOL 350 MG/ML SOLN
100.0000 mL | Freq: Once | INTRAVENOUS | Status: AC | PRN
Start: 2023-09-06 — End: 2023-09-06
  Administered 2023-09-06: 100 mL via INTRAVENOUS

## 2023-09-06 NOTE — Consult Note (Signed)
 [facility] Telemedicine Consult   Requesting Provider: Pamella HERO Consult Participants: Bedside nursing Location of the provider: [facility] Location of the patient: [facility]  This consult was provided via telemedicine with 2-way video and audio communication. The patient/family was informed that care would be provided in this way and agreed to receive care in this manner.    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  He reports compliance with his Eliquis .  He had a transient episode of confusion this morning lasting about an hour to two.  He states that he was having some difficulty with getting his words out, and just felt confused.  He denies any numbness, weakness, changes in vision, or other neurological complaints.  He currently feels like he is completely back to his normal self.     LKW: 11:30 a.m. tnk given?: No, anticoagulated IR Thrombectomy? No, resolved symptoms Time of teleneurologist evaluation: 15:22  Exam: Vitals:   09/06/23 1437  BP: 134/73  Pulse: 64  Resp: 18  Temp: 97.9 F (36.6 C)  SpO2: 99%    General: in bed, nad  1A: Level of Consciousness - 0 1B: Ask Month and Age - 0 1C: 'Blink Eyes' & 'Squeeze Hands' - 0 2: Test Horizontal Extraocular Movements - 0 3: Test Visual Fields - 0 4: Test Facial Palsy - 0 5A: Test Left Arm Motor Drift - 0 5B: Test Right Arm Motor Drift - 0 6A: Test Left Leg Motor Drift - 0 6B: Test Right Leg Motor Drift - 0 7: Test Limb Ataxia - 0 8: Test Sensation - 1 chronic right-sided numbness 9: Test Language/Aphasia- 0 10: Test Dysarthria - 0 11: Test Extinction/Inattention - 0 NIHSS score: 1   Imaging Reviewed: CT/CTA/CTP - negative  Labs reviewed in epic and pertinent values follow: Glucose 104   Assessment:  83 year old male with nonspecific mild transient confusional episode.  Even if this did represent transient ischemic attack, he is already anticoagulated and therefore I do not think he would need admission in that case.  He will need an MRI as if he has significant ischemic burden, then he will need to hold his Eliquis  for a time.  Recommendations:  MRI brain If negative, no further acute inpatient workup at this time.  Aisha Seals, MD [facility] [phone]  If 7pm- 7am, please page neurology on call as listed in AMION.

## 2023-09-06 NOTE — ED Provider Notes (Signed)
 [facility] Provider Note   CSN: [id number] Arrival date & time: 09/06/23  1423  An emergency department physician performed an initial assessment on this suspected stroke patient at 8.  Patient presents with: Altered Mental Status and Headache   Kevin Bass is a 83 y.o. male with past medical history significant for hyperlipidemia, hypertension, GERD, previous history of CVA x 3 who presents concern for sudden onset headache starting at noon, no fall, injury but reports some confusion associated with a headache.  Denies any numbness, speech issues.  When asked exactly what he meant by confusion question some possible subtle aphasia, he is having difficulty telling me exactly what he means about forgetting his appointment, otherwise his speech is sensible, normal object identification, alert and oriented x 3.    Altered Mental Status Associated symptoms: headaches   Headache      Prior to Admission medications   Medication Sig Start Date End Date Taking? Authorizing Provider  acetaminophen  (TYLENOL ) 650 MG CR tablet Take 1,300 mg by mouth every 8 (eight) hours as needed for pain.    [provider]  ALPRAZolam  (XANAX ) 0.25 MG tablet Take 1 tablet (0.25 mg total) by mouth 2 (two) times daily as needed for anxiety. 07/05/23   Amon Aloysius BRAVO, MD  amLODipine  (NORVASC ) 5 MG tablet Take 5mg  by mouth in the morning and 2.5mg  by mouth in the evening. 03/22/23   Hilty, Vinie BROCKS, MD  Arginine  500 MG CAPS Take 1,000 mg by mouth daily.     [provider]  Baclofen  5 MG TABS TAKE 1 TABLET BY MOUTH THREE TIMES DAILY 08/15/23   Georjean Darice HERO, MD  Coenzyme Q10 100 MG TABS Take 1 tablet by mouth in the morning and at bedtime.    [provider]  ELIQUIS  5 MG TABS tablet TAKE 1 TABLET BY MOUTH TWICE DAILY 05/17/23   Hilty, Vinie BROCKS, MD  gabapentin  (NEURONTIN ) 300 MG capsule Take 2 capsules in AM and 3 capsules at night  07/26/22   Georjean Darice HERO, MD  Glucosamine-MSM-Hyaluronic Acd (JOINT HEALTH PO) Take 1 capsule by mouth daily.     [provider]  hydrocortisone  (ANUSOL -HC) 25 MG suppository Place 1 suppository (25 mg total) rectally 2 (two) times daily as needed for hemorrhoids. 05/16/23   Amon Aloysius BRAVO, MD  loratadine  (CLARITIN ) 10 MG tablet Take 10 mg by mouth at bedtime.    [provider]  metoprolol  succinate (TOPROL -XL) 25 MG 24 hr tablet Take 3 tablets (75 mg total) by mouth daily. Take with or immediately following a meal 09/05/23   [name], Jose E, MD  Multiple Vitamins-Minerals (OCUVITE PRESERVISION PO) Take 1 capsule by mouth 2 (two) times daily.     [provider]  nitroGLYCERIN  (NITROSTAT ) 0.4 MG SL tablet Place 1 tablet (0.4 mg total) under the tongue every 5 (five) minutes x 3 doses as needed for chest pain. Patient not taking: Reported on 05/16/2023 12/30/21   [name], Jose E, MD  NON FORMULARY Take 1-2 packets by mouth See admin instructions. Lypo-Spheric vitamin C gel packets- Mix 1-2 packets of gel into juice one to two times a day and drink    [provider]  NON FORMULARY Take 10 mg by mouth See admin instructions. [employer] Formulas Lyco-Sorb (for Prostate & Cardiovascular Health) 10 mg capsules: Take 10 mg by mouth two times a day    [provider]  Omega-3 Fatty Acids (FISH OIL PO) Take  1 capsule by mouth in the morning and at bedtime.    [provider]  omeprazole  (PRILOSEC) 20 MG capsule Take 1 capsule (20 mg total) by mouth daily before breakfast. 08/18/23   Amon Aloysius BRAVO, MD  OXcarbazepine  (TRILEPTAL ) 150 MG tablet Take 1/2 tablet in AM, 1 tablet in PM 07/26/22   Georjean Darice HERO, MD  pravastatin  (PRAVACHOL ) 40 MG tablet Take 1 tablet (40 mg total) by mouth at bedtime. 12/03/22   Amon Aloysius BRAVO, MD  telmisartan  (MICARDIS ) 80 MG tablet TAKE 1 TABLET(80 MG) BY MOUTH DAILY 01/11/23   Hilty, Vinie BROCKS, MD  Vitamin D, Cholecalciferol, 25 MCG (1000 UT) TABS Take  1 tablet by mouth daily.    [provider]    Allergies: Prednisolone acetate, Rosuvastatin , and Statins    Review of Systems  Neurological:  Positive for headaches.  All other systems reviewed and are negative.   Updated Vital Signs BP (!) 141/76   Pulse 66   Temp 97.9 F (36.6 C)   Resp 14   Ht 5' 10 (1.778 m)   Wt 77.1 kg   SpO2 98%   BMI 24.39 kg/m   Physical Exam Vitals and nursing note reviewed.  Constitutional:      General: He is not in acute distress.    Appearance: Normal appearance.  HENT:     Head: Normocephalic and atraumatic.  Eyes:     General:        Right eye: No discharge.        Left eye: No discharge.  Cardiovascular:     Rate and Rhythm: Normal rate and regular rhythm.     Heart sounds: No murmur heard.    No friction rub. No gallop.  Pulmonary:     Effort: Pulmonary effort is normal.     Breath sounds: Normal breath sounds.  Abdominal:     General: Bowel sounds are normal.     Palpations: Abdomen is soft.  Skin:    General: Skin is warm and dry.     Capillary Refill: Capillary refill takes less than 2 seconds.  Neurological:     Mental Status: He is alert and oriented to person, place, and time.     Comments: Cranial nerves II through XII grossly intact.  Intact finger-nose, intact heel-to-shin.  Romberg negative, gait normal.  Alert and oriented x3.  Moves all 4 limbs spontaneously, normal coordination.  No pronator drift.  Intact strength 5 out of 5 bilateral upper and lower extremities.  Question aphasia -- couldn't find words to finish sentence when describing his confused episode  Psychiatric:        Mood and Affect: Mood normal.        Behavior: Behavior normal.     (all labs ordered are listed, but only abnormal results are displayed) Labs Reviewed  COMPREHENSIVE METABOLIC PANEL WITH GFR - Abnormal; Notable for the following components:      Result Value   Sodium 133 (*)    Chloride 96 (*)    Glucose, Bld 104 (*)     GFR, Estimated 57 (*)    All other components within normal limits  URINALYSIS, ROUTINE W REFLEX MICROSCOPIC - Abnormal; Notable for the following components:   Protein, ur 30 (*)    All other components within normal limits  PROTIME-INR - Abnormal; Notable for the following components:   Prothrombin Time 17.7 (*)    INR 1.4 (*)    All other components within normal limits  URINALYSIS, MICROSCOPIC (REFLEX) - Abnormal; Notable for the following components:   Bacteria, UA RARE (*)    All other components within normal limits  CBC  ETHANOL  APTT  DIFFERENTIAL  URINE DRUG SCREEN  CBG MONITORING, ED    EKG: None  Radiology: CT ANGIO HEAD NECK W WO CM W PERF (CODE STROKE) Result Date: 09/06/2023 CLINICAL DATA:  Acute neurological deficit. EXAM: CT ANGIOGRAPHY OF THE HEAD AND NECK CT PERFUSION BRAIN TECHNIQUE: Multidetector CT imaging of the head and neck was performed using the standard protocol during bolus administration of intravenous contrast. Multiplanar CT image reconstructions and MIPs were obtained to evaluate the vascular anatomy. Carotid stenosis measurements (when applicable) are obtained utilizing NASCET criteria, using the distal internal carotid diameter as the denominator. Multiphase CT imaging of the brain was performed following IV bolus contrast injection. Subsequent parametric perfusion maps were calculated using RAPID software. RADIATION DOSE REDUCTION: This exam was performed according to the departmental dose-optimization program which includes automated exposure control, adjustment of the mA and/or kV according to patient size and/or use of iterative reconstruction technique. CONTRAST:  OMNIPAQUE  IOHEXOL  350 MG/ML SOLN COMPARISON:  CT the head dated September 06, 2023 and MRI of the head dated September 30, 2019. FINDINGS: CTA NECK Aortic arch: There is mild to moderate calcific plaque within the distal arch. There is no evidence of aneurysm or stenosis. The brachiocephalic  artery is widely patent and unremarkable. Right carotid system: The common carotid artery is normal in caliber. There is calcific plaque within the carotid bulb and origin of the internal carotid artery causing less than 10% luminal stenosis. The remainder is normal in caliber. Left carotid system: The common carotid arteries normal in caliber and unremarkable. There is mild calcific plaque within the carotid bulb in origin of the internal carotid artery, with 0% stenosis. The remainder of the cervical segment is normal in caliber. Vertebral arteries:The left vertebral artery is dominant and tortuous, but normal in caliber throughout its course. The right vertebral artery is also tortuous and demonstrates mild to moderate stenosis at the junction of the V2 and V3 segments. Skeleton: Moderate degenerative disc disease and facet arthrosis throughout the cervical spine. No osseous lesions. Other neck: Negative. CTA HEAD Anterior circulation: The cranial and cavernous segments of the internal carotid arteries demonstrate no significant stenosis. There is calcific atheromatous disease with less than 20% stenosis bilaterally. There is irregularity of the wall of the M1 segment of the right middle cerebral artery, which demonstrates diffuse mild stenosis. The M2 branches are normal in caliber. The left middle cerebral artery and its branches are normal in caliber. There is mild stenosis of the distal left A1 segment. Posterior circulation: The vertebrobasilar system is unremarkable. There is mild diffuse stenosis of the left posterior cerebral artery. There is no flow-limiting stenosis. The right posterior cerebral artery is normal in caliber. Venous sinuses: Patent. Anatomic variants: None. Delayed phase: Unremarkable. CT Brain Perfusion Findings: ASPECTS: 10. CBF (<30%) Volume: 0mL Perfusion (Tmax>6.0s) volume: 0mL Mismatch Volume: 0mL Infarction Location:Not applicable. IMPRESSION: 1. Mild atheromatous disease within  the origins of the internal carotid arteries bilaterally, with less than 10% stenosis. 2. Mild-to-moderate atheromatous irregularity of the M1 segment of the right middle cerebral artery and mild-to-moderate stenosis of the left distal A1 segment and left posterior cerebral artery. No evidence of large vessel occlusion or flow-limiting stenosis. 3. Normal CT perfusion study. These results were communicated to Dr. Michaela at 3:58 pm on 09/06/2023 by text page via  the [employer] system. Electronically Signed   By: Evalene Coho M.D.   On: 09/06/2023 16:09   CT HEAD CODE STROKE WO CONTRAST Result Date: 09/06/2023 CLINICAL DATA:  Code stroke. Acute neurological deficit. Altered mental status. EXAM: CT HEAD WITHOUT CONTRAST TECHNIQUE: Contiguous axial images were obtained from the base of the skull through the vertex without intravenous contrast. RADIATION DOSE REDUCTION: This exam was performed according to the departmental dose-optimization program which includes automated exposure control, adjustment of the mA and/or kV according to patient size and/or use of iterative reconstruction technique. COMPARISON:  CT of the head dated December 03, 2017. FINDINGS: Brain: Age-related cerebral atrophy. Chronic encephalomalacia changes within the left frontal parietal junction and right cerebellar hemisphere. Mild periventricular and deep cerebral white matter disease. No evidence of hemorrhage, mass or acute cortical infarct. Vascular: Moderate calcific atheromatous disease within the carotid siphons. Skull: Intact and unremarkable. Sinuses/Orbits: Mild polypoid mucosal disease within the floor the right maxillary sinus. Normal orbits. Other: None. ASPECTS [facility] Stroke Program Early CT Score) - Ganglionic level infarction (caudate, lentiform nuclei, internal capsule, insula, M1-M3 cortex): 7. - Supraganglionic infarction (M4-M6 cortex): 3. Total score (0-10 with 10 being normal): 10. IMPRESSION: 1. Chronic  encephalomalacia changes within the right cerebellar hemisphere and left frontal parietal junction. No apparent acute process. 2. ASPECTS is 10. 3. These results were called by telephone at the time of interpretation on 09/06/2023 at 3:30 pm to provider [facility] , who verbally acknowledged these results. Electronically Signed   By: Evalene Coho M.D.   On: 09/06/2023 15:33     Procedures   Medications Ordered in the ED  iohexol  (OMNIPAQUE ) 350 MG/ML injection 100 mL (100 mLs Intravenous Contrast Given 09/06/23 1545)    Clinical Course as of 09/06/23 1617  Tue Sep 06, 2023  1529 Coho -- code stroke CT unchanged from prior [CP]    Clinical Course User Index [CP] Jonpaul Lumm, Sherlean DEL, PA-C                                 Medical Decision Making Amount and/or Complexity of Data Reviewed Labs: ordered.   This patient is a 83 y.o. male who presents to the ED for concern of headache, confusion, this involves an extensive number of treatment options, and is a complaint that carries with it a high risk of complications and morbidity. The emergent differential diagnosis prior to evaluation includes, but is not limited to,  Stroke, increased ICP, meningitis, CVA, intracranial tumor, venous sinus thrombosis, migraine, cluster headache, hypertension, drug related, head injury, tension headache, sinusitis, dental abscess, otitis media, TMJ vs CVA, seizure, hypotension, sepsis, hypoglycemia, hypoxic encephalopathy, metabolic encephalopathy, polypharmacy, substance abuse, developing dementia or alzheimers, meningitis, encephalitis, hypertensive emergency, other systemic infection, acute alcohol intoxication, acute alcohol or other drug withdrawal or psychiatric manifestation vs other . This is not an exhaustive differential.   Past Medical History / Co-morbidities / Social History: hyperlipidemia, hypertension, GERD, previous history of CVA x 3  Additional history: Chart reviewed.  Pertinent results include: Reviewed lab work, imaging from previous emergency department visits, outpatient failure medicine visits  Physical Exam: Physical exam performed. The pertinent findings include: Vital signs stable, no hypertension,  Lab Tests: I ordered, and personally interpreted labs.  The pertinent results include: CBC unremarkable, PT/INR mildly elevated INR 1.4.  CMP with mild hyponatremia, sodium 133, mild hypochloremia, chloride 96.  Negative ethanol level, normal APTT.  Imaging Studies: I ordered imaging studies including CT head, CTA head and neck with without contrast. I independently visualized and interpreted imaging which showed chronic encephalomalacia, no evidence of acute new infarct. I agree with the radiologist interpretation.   Cardiac Monitoring:  The patient was maintained on a cardiac monitor.  My attending physician Dr. Elnor viewed and interpreted the cardiac monitored which showed an underlying rhythm of: NSR. I agree with this interpretation.   Consultations Obtained: I requested consultation with the neurologist, spoke first with Dr. Hugh with teleneurology,  then dr. Michaela and discussed lab and imaging findings as well as pertinent plan - they recommend: Transfer ED to ED for MR brain, if no acute new infarct, stable for discharge, if any new changes noted, may require a conversation regarding cessation of Eliquis    Disposition: After consideration of the diagnostic results and the patients response to treatment, I feel that patient would benefit from ED to ED transfer, Dr. Mannie accepts patient in transfer at this time.   I discussed this case with my attending physician Dr. Elnor who cosigned this note including patient's presenting symptoms, physical exam, and planned diagnostics and interventions. Attending physician stated agreement with plan or made changes to plan which were implemented.     Final diagnoses:  None    ED Discharge  Orders     None          Rosan Sherlean DEL, PA-C 09/06/23 1617    Pamella Ozell LABOR, DO 09/13/23 1534

## 2023-09-06 NOTE — ED Notes (Signed)
 Checking visual fields with Neuro and other assessments with Neuro on tele neuro.

## 2023-09-06 NOTE — ED Triage Notes (Signed)
 Pt BIB [facility] from [facility]. Pt came in with AMS to [facility] and headache that began at 1200. At this time pt A&Ox4. Pt transferred to receive MRI for stroke workup. Hx of previous strokes.

## 2023-09-06 NOTE — ED Triage Notes (Signed)
 Pt here with AMS and a headache that started 1200. Pt states his headache is getting better. Pt denies fall or injury but states when the headache first occurred he had confusion. Pt denies any numbness or speech issues. Pt ambulatory to triage. Pt has hx of 3 strokes and wanted to be evaluated.

## 2023-09-06 NOTE — ED Notes (Signed)
 Family at bedside, requesting update.

## 2023-09-06 NOTE — Progress Notes (Incomplete)
 Primary Care Physician: Amon Aloysius BRAVO, MD Primary Cardiologist: Vinie JAYSON Maxcy, MD Electrophysiologist: Danelle Birmingham, MD  Referring Physician: ***   Kevin Bass is a 83 y.o. male with a history of HTN, CVA, HLD, VHD s/p mitral valve repair, atrial fibrillation who presents for follow up in the [facility].  The patient was initially diagnosed with atrial fibrillation on ILR following a cryptogenic stroke. He has been maintained on Eliquis  for stroke prevention. He was seen at the ED     Patient presents today for follow up for atrial fibrillation. ***  Today, he denies symptoms of ***palpitations, chest pain, shortness of breath, orthopnea, PND, lower extremity edema, dizziness, presyncope, syncope, snoring, daytime somnolence, bleeding, or neurologic sequela. The patient is tolerating medications without difficulties and is otherwise without complaint today.    Atrial Fibrillation Risk Factors:  he {Action; does/does not:19097} have symptoms or diagnosis of sleep apnea. he {Action; does/does not:19097} have a history of rheumatic fever. he {Action; does/does not:19097} have a history of alcohol use. The patient {Action; does/does not:19097} have a history of early familial atrial fibrillation or other arrhythmias.  Atrial Fibrillation Management history:  Previous antiarrhythmic drugs: *** Previous cardioversions: *** Previous ablations: *** Anticoagulation history: ***  ROS- All systems are reviewed and negative except as per the HPI above.  Past Medical History:  Diagnosis Date   Allergy    Anxiety    Arthritis    hands   Atrial fibrillation (HCC)    Colon cancer (HCC)    s/p partial colectomy 1990, Cscope neg 7-09, next 2014   DISORDER, MITRAL VALVE    MV recontruction at [facility] 2006----still needs ABX for SBE prophylaxis    Elevated PSA    prostate nodule, Dr Renda   GERD (gastroesophageal reflux disease)    Heart murmur    no  problems since surgery   HYPERGLYCEMIA, BORDERLINE 10/16/2008   HYPERLIPIDEMIA 06/15/2006   diet controlled   Hypertension    HYPERTROPHY PROSTATE W/O UR OBST & OTH LUTS 10/16/2008   Neuropathy    Right groin pain    Dr. Tanda   SCC (squamous cell carcinoma) 06/2017   s/p Mohs---Dr Judith   Stroke [facility])    Wears partial dentures    lower partial    Current Outpatient Medications  Medication Sig Dispense Refill   acetaminophen  (TYLENOL ) 650 MG CR tablet Take 1,300 mg by mouth every 8 (eight) hours as needed for pain.     ALPRAZolam  (XANAX ) 0.25 MG tablet Take 1 tablet (0.25 mg total) by mouth 2 (two) times daily as needed for anxiety. 60 tablet 2   amLODipine  (NORVASC ) 5 MG tablet Take 5mg  by mouth in the morning and 2.5mg  by mouth in the evening. 90 tablet 2   Arginine  500 MG CAPS Take 1,000 mg by mouth daily.      Baclofen  5 MG TABS TAKE 1 TABLET BY MOUTH THREE TIMES DAILY 270 tablet 0   Coenzyme Q10 100 MG TABS Take 1 tablet by mouth in the morning and at bedtime.     ELIQUIS  5 MG TABS tablet TAKE 1 TABLET BY MOUTH TWICE DAILY 180 tablet 1   gabapentin  (NEURONTIN ) 300 MG capsule Take 2 capsules in AM and 3 capsules at night 450 capsule 3   Glucosamine-MSM-Hyaluronic Acd (JOINT HEALTH PO) Take 1 capsule by mouth daily.      hydrocortisone  (ANUSOL -HC) 25 MG suppository Place 1 suppository (25 mg total) rectally 2 (two) times daily  as needed for hemorrhoids. 12 suppository 1   loratadine  (CLARITIN ) 10 MG tablet Take 10 mg by mouth at bedtime.     metoprolol  succinate (TOPROL -XL) 25 MG 24 hr tablet Take 3 tablets (75 mg total) by mouth daily. Take with or immediately following a meal 270 tablet 1   Multiple Vitamins-Minerals (OCUVITE PRESERVISION PO) Take 1 capsule by mouth 2 (two) times daily.      nitroGLYCERIN  (NITROSTAT ) 0.4 MG SL tablet Place 1 tablet (0.4 mg total) under the tongue every 5 (five) minutes x 3 doses as needed for chest pain. (Patient not taking: Reported on 05/16/2023)  25 tablet 3   NON FORMULARY Take 1-2 packets by mouth See admin instructions. Lypo-Spheric vitamin C gel packets- Mix 1-2 packets of gel into juice one to two times a day and drink     NON FORMULARY Take 10 mg by mouth See admin instructions. [employer] Formulas Lyco-Sorb (for Prostate & Cardiovascular Health) 10 mg capsules: Take 10 mg by mouth two times a day     Omega-3 Fatty Acids (FISH OIL PO) Take 1 capsule by mouth in the morning and at bedtime.     omeprazole  (PRILOSEC) 20 MG capsule Take 1 capsule (20 mg total) by mouth daily before breakfast. 90 capsule 1   OXcarbazepine  (TRILEPTAL ) 150 MG tablet Take 1/2 tablet in AM, 1 tablet in PM 135 tablet 3   pravastatin  (PRAVACHOL ) 40 MG tablet Take 1 tablet (40 mg total) by mouth at bedtime. 90 tablet 1   telmisartan  (MICARDIS ) 80 MG tablet TAKE 1 TABLET(80 MG) BY MOUTH DAILY 90 tablet 3   Vitamin D, Cholecalciferol, 25 MCG (1000 UT) TABS Take 1 tablet by mouth daily.     No current facility-administered medications for this visit.    Physical Exam: There were no vitals taken for this visit.  GEN: Well nourished, well developed in no acute distress NECK: No JVD; No carotid bruits CARDIAC: {EPRHYTHM:28826}, no murmurs, rubs, gallops RESPIRATORY:  Clear to auscultation without rales, wheezing or rhonchi  ABDOMEN: Soft, non-tender, non-distended EXTREMITIES:  No edema; No deformity   Wt Readings from Last 3 Encounters:  09/03/23 77.1 kg  05/16/23 80.8 kg  04/04/23 81.8 kg     EKG today demonstrates  ***  Echo *** demonstrated  ***   CHA2DS2-VASc Score =    The patient's score is based upon:   {Click here to calculate score.  REFRESH note before signing. :1}     ASSESSMENT AND PLAN: {Select the correct AFib Diagnosis                 :[id number]}   SignedQuita JONELLE Kicks, PA    09/06/2023 1:13 PM      Follow up ***  {Are you ordering a CV Procedure (e.g. stress test, cath, DCCV, TEE, etc)?   Press F2        :[id number]}     [facility] [address] [place], [place] [address] [phone]

## 2023-09-06 NOTE — ED Notes (Signed)
 Dr. Michaela is on  tele with Pt. At this time  Pt. Last himself at 11:30 he stated.  Pt. Reports he felt odd at when he thought about his appt. He was to go to.

## 2023-09-06 NOTE — ED Notes (Signed)
 Patient arrival by EMS @ 1424 Triage Completed @ 1441 LKW 1130 EDP exam @1451  Code stroke orders placed @1507  This RN notified of code stroke at 1518 Activated teleneurology at 1519 Taken to CT @ 1520 Telestroke MD Blanca) response @ 1524 Ordered CT perfusion @ 1531 Back to room for Neuro assessment @ 1539

## 2023-09-06 NOTE — ED Provider Notes (Signed)
  Physical Exam  BP 127/69   Pulse 67   Temp 99.1 F (37.3 C) (Oral)   Resp 12   Ht 5' 10 (1.778 m)   Wt 77.1 kg   SpO2 98%   BMI 24.39 kg/m   Physical Exam  Procedures  Procedures  ED Course / MDM   Clinical Course as of 09/06/23 2301  Tue Sep 06, 2023  1529 Hugh -- code stroke CT unchanged from prior [CP]    Clinical Course User Index [CP] Rosan Sherlean DEL, PA-C   Medical Decision Making Amount and/or Complexity of Data Reviewed Labs: ordered. Radiology: ordered.   Patient presents to the emergency department as a transfer from an [facility] for MRI.  In short, patient is an 83 year old male with history significant for 3 CVAs who presented to the Emergency Department complaining of a sudden onset headache which began at noon with some slight word salad associated with a headache.  Patient's headache and confusion improved without intervention.  Patient had MRI spine.  Results: 1. No evidence of acute intracranial abnormality.  2. Remote right cerebellar and left parietal infarcts.    Patient is asymptomatic at this time.  No sign of stroke on imaging.  Patient eager for and stable for discharge home with PCP follow-up.      Logan Ubaldo NOVAK, PA-C 09/06/23 2301    Neysa Caron PARAS, DO 09/06/23 [phone]

## 2023-09-06 NOTE — ED Triage Notes (Signed)
 BIB EMS. Patient states he was confused this morning and had headache which has resolved. Recently seen here for A.fib RVR. Stroke screen negative with EMS.

## 2023-09-06 NOTE — Discharge Instructions (Signed)
 Your workup today was reassuring with no signs of stroke.  Please follow-up with your primary care provider for further evaluation if needed.  If you develop any emergent symptoms please return to the emergency department for further evaluation.

## 2023-09-06 NOTE — ED Notes (Signed)
 Spoke with daughter, Kevin Bass on the phone, states she will be here within the next 30 minutes to pick patient up. Patient states that he will wait in the waiting room so he can she when she arrives. Patient is A&OX4.

## 2023-09-09 ENCOUNTER — Inpatient Hospital Stay (HOSPITAL_COMMUNITY)
Admission: RE | Admit: 2023-09-09 | Discharge: 2023-09-09 | Disposition: A | Discharge status: Home / Self Care | Source: Ambulatory Visit | Attending: Internal Medicine | Admitting: Internal Medicine

## 2023-09-09 ENCOUNTER — Ambulatory Visit (HOSPITAL_COMMUNITY)
Admission: RE | Admit: 2023-09-09 | Discharge: 2023-09-09 | Disposition: A | Discharge status: Home / Self Care | Source: Ambulatory Visit | Attending: Internal Medicine | Admitting: Internal Medicine

## 2023-09-09 ENCOUNTER — Encounter (HOSPITAL_COMMUNITY): Payer: Self-pay | Admitting: Internal Medicine

## 2023-09-09 VITALS — BP 122/76 | HR 65 | Ht 70.0 in | Wt 175.8 lb

## 2023-09-09 DIAGNOSIS — I480 Paroxysmal atrial fibrillation: Secondary | ICD-10-CM | POA: Diagnosis not present

## 2023-09-09 DIAGNOSIS — D6869 Other thrombophilia: Secondary | ICD-10-CM

## 2023-09-09 MED ORDER — METOPROLOL SUCCINATE ER 25 MG PO TB24: 100.0000 mg | ORAL_TABLET | Freq: Every day | ORAL | 1 refills | Status: DC

## 2023-09-09 NOTE — Patient Instructions (Signed)
 Continue metoprolol  100mg  once a day

## 2023-09-09 NOTE — Progress Notes (Signed)
 Primary Care Physician: Amon Aloysius BRAVO, MD Primary Cardiologist: Vinie JAYSON Maxcy, MD Electrophysiologist: Danelle Birmingham, MD     Referring Physician: ED     Kevin Bass is a 83 y.o. male with a history of multiple CVA, coronary artery and aortic calcifications by CT imaging, s/p mitral valve repair, HLD, HTN, and history of paroxysmal atrial fibrillation who presents for consultation in the [facility]. Seen in ED on 7/12 for tachycardia with metoprolol  increased from 75 to 100 mg daily. Seen in ED again on 7/15 for confusion and underwent MRI with no acute ischemic burden noted. Patient is on Eliquis  5 mg BID for a CHADS2VASC score of 6.  On evaluation today, he is currently in NSR. He notes to not feel any palpitations since metoprolol  increase. No missed doses of Eliquis .   Today, he denies symptoms of palpitations, chest pain, shortness of breath, orthopnea, PND, lower extremity edema, dizziness, presyncope, syncope, snoring, daytime somnolence, bleeding, or neurologic sequela. The patient is tolerating medications without difficulties and is otherwise without complaint today.     he has a BMI of Body mass index is 25.22 kg/m.SABRA Filed Weights   09/09/23 1410  Weight: 79.7 kg    Current Outpatient Medications  Medication Sig Dispense Refill   acetaminophen  (TYLENOL ) 650 MG CR tablet Take 1,300 mg by mouth every 8 (eight) hours as needed for pain.     ALPRAZolam  (XANAX ) 0.25 MG tablet Take 1 tablet (0.25 mg total) by mouth 2 (two) times daily as needed for anxiety. 60 tablet 2   amLODipine  (NORVASC ) 5 MG tablet Take 5mg  by mouth in the morning and 2.5mg  by mouth in the evening. 90 tablet 2   Arginine  500 MG CAPS Take 1,000 mg by mouth daily.      Baclofen  5 MG TABS TAKE 1 TABLET BY MOUTH THREE TIMES DAILY 270 tablet 0   Coenzyme Q10 100 MG TABS Take 1 tablet by mouth in the morning and at bedtime.     ELIQUIS  5 MG TABS tablet TAKE 1 TABLET BY MOUTH  TWICE DAILY 180 tablet 1   gabapentin  (NEURONTIN ) 300 MG capsule Take 2 capsules in AM and 3 capsules at night 450 capsule 3   Glucosamine-MSM-Hyaluronic Acd (JOINT HEALTH PO) Take 1 capsule by mouth daily.      hydrocortisone  (ANUSOL -HC) 25 MG suppository Place 1 suppository (25 mg total) rectally 2 (two) times daily as needed for hemorrhoids. 12 suppository 1   loratadine  (CLARITIN ) 10 MG tablet Take 10 mg by mouth at bedtime.     Multiple Vitamins-Minerals (OCUVITE PRESERVISION PO) Take 1 capsule by mouth 2 (two) times daily.      nitroGLYCERIN  (NITROSTAT ) 0.4 MG SL tablet Place 1 tablet (0.4 mg total) under the tongue every 5 (five) minutes x 3 doses as needed for chest pain. 25 tablet 3   NON FORMULARY Take 1-2 packets by mouth See admin instructions. Lypo-Spheric vitamin C gel packets- Mix 1-2 packets of gel into juice one to two times a day and drink     NON FORMULARY Take 10 mg by mouth See admin instructions. Jarrow Formulas Lyco-Sorb (for Prostate & Cardiovascular Health) 10 mg capsules: Take 10 mg by mouth two times a day     Omega-3 Fatty Acids (FISH OIL PO) Take 1 capsule by mouth in the morning and at bedtime.     omeprazole  (PRILOSEC) 20 MG capsule Take 1 capsule (20 mg total) by mouth daily before breakfast. 90  capsule 1   OXcarbazepine  (TRILEPTAL ) 150 MG tablet Take 1/2 tablet in AM, 1 tablet in PM 135 tablet 3   pravastatin  (PRAVACHOL ) 40 MG tablet Take 1 tablet (40 mg total) by mouth at bedtime. 90 tablet 1   telmisartan  (MICARDIS ) 80 MG tablet TAKE 1 TABLET(80 MG) BY MOUTH DAILY 90 tablet 3   Vitamin D, Cholecalciferol, 25 MCG (1000 UT) TABS Take 1 tablet by mouth daily.     metoprolol  succinate (TOPROL -XL) 25 MG 24 hr tablet Take 4 tablets (100 mg total) by mouth daily. Take with or immediately following a meal 360 tablet 1   No current facility-administered medications for this encounter.    Atrial Fibrillation Management history:  Previous antiarrhythmic drugs:  none Previous cardioversions: none Previous ablations: none Anticoagulation history: Eliquis    ROS- All systems are reviewed and negative except as per the HPI above.  Physical Exam: BP 122/76   Pulse 65   Ht 5' 10 (1.778 m)   Wt 79.7 kg   BMI 25.22 kg/m   GEN: Well nourished, well developed in no acute distress NECK: No JVD; No carotid bruits CARDIAC: Regular rate and rhythm, no murmurs, rubs, gallops RESPIRATORY:  Clear to auscultation without rales, wheezing or rhonchi  ABDOMEN: Soft, non-tender, non-distended EXTREMITIES:  No edema; No deformity   EKG today demonstrates  Vent. rate 65 BPM PR interval 186 ms QRS duration 86 ms QT/QTcB 386/401 ms P-R-T axes 16 47 28 Normal sinus rhythm Normal ECG When compared with ECG of 06-Sep-2023 14:42, PREVIOUS ECG IS PRESENT  Echo 12/29/22 demonstrated  1. Left ventricular ejection fraction, by estimation, is 50 to 55%. The  left ventricle has low normal function. The left ventricle has no regional  wall motion abnormalities. Left ventricular diastolic parameters are  indeterminate.   2. Right ventricular systolic function is normal. The right ventricular  size is normal.   3. Left atrial size was mildly dilated.   4. The mitral valve has been repaired/replaced. Trivial mitral valve  regurgitation. No evidence of mitral stenosis. The mean mitral valve  gradient is 3.0 mmHg. There is a prosthetic annuloplasty ring present in  the mitral position.   5. The aortic valve is tricuspid. Aortic valve regurgitation is not  visualized. No aortic stenosis is present.   6. The inferior vena cava is normal in size with greater than 50%  respiratory variability, suggesting right atrial pressure of 3 mmHg.   ASSESSMENT & PLAN CHA2DS2-VASc Score = 6  The patient's score is based upon: CHF History: 0 HTN History: 1 Diabetes History: 0 Stroke History: 2 Vascular Disease History: 1 Age Score: 2 Gender Score: 0       ASSESSMENT  AND PLAN: Paroxysmal Atrial Fibrillation (ICD10:  I48.0) The patient's CHA2DS2-VASc score is 6, indicating a 9.7% annual risk of stroke.    He is currently in NSR. Will continue Toprol  100 mg daily. We will place a cardiac monitor for 2 weeks to assess PAF burden. Going forward, if requires AAD therapy could consider Multaq, Tikosyn 250 mcg, or amiodarone.  PR 172 ms Qtc in NSR 406 ms CrCl 47 mL/min  Secondary Hypercoagulable State (ICD10:  D68.69) The patient is at significant risk for stroke/thromboembolism based upon his CHA2DS2-VASc Score of 6.  Continue Apixaban  (Eliquis ).  Continue Eliquis  5 mg BID without interruption.    Will notify of monitor results. Follow up as scheduled with Dr. Mona.    Terra Pac, PA-C  [facility] [address]  [address] [place], [place] [address] [phone]

## 2023-09-14 ENCOUNTER — Other Ambulatory Visit: Payer: Self-pay

## 2023-09-14 MED ORDER — AMLODIPINE BESYLATE 5 MG PO TABS: ORAL_TABLET | ORAL | 0 refills | Status: DC

## 2023-09-20 ENCOUNTER — Other Ambulatory Visit: Payer: Self-pay | Admitting: Internal Medicine

## 2023-09-28 DIAGNOSIS — I480 Paroxysmal atrial fibrillation: Secondary | ICD-10-CM | POA: Diagnosis not present

## 2023-09-29 ENCOUNTER — Ambulatory Visit (HOSPITAL_COMMUNITY): Payer: Self-pay | Admitting: Internal Medicine

## 2023-09-29 NOTE — Addendum Note (Signed)
 Encounter addended by: Janel Nancy SAUNDERS, RN on: 09/29/2023 9:42 AM  Actions taken: Imaging Exam ended

## 2023-10-03 ENCOUNTER — Ambulatory Visit (INDEPENDENT_AMBULATORY_CARE_PROVIDER_SITE_OTHER): Payer: PPO | Admitting: Internal Medicine

## 2023-10-03 VITALS — BP 128/58 | HR 57 | Temp 97.6°F | Resp 16 | Ht 70.0 in | Wt 175.8 lb

## 2023-10-03 DIAGNOSIS — I10 Essential (primary) hypertension: Secondary | ICD-10-CM | POA: Diagnosis not present

## 2023-10-03 DIAGNOSIS — I480 Paroxysmal atrial fibrillation: Secondary | ICD-10-CM

## 2023-10-03 NOTE — Progress Notes (Signed)
 Subjective:    Patient ID: Kevin Bass, male    DOB: 1940/04/05, 83 y.o.   MRN: [id number]  DOS:  10/03/2023 Type of visit - description: Follow-up  Events since the last visit with me reviewed:  ER 09/03/2023: Seen with palpitations. Workup showed BNP of 379.  Troponin negative.  Chest x-ray L basilar ATX. Found to be in A-fib with RVR, spontaneously converted to sinus rhythm. On-call cardiologist Rx increase metoprolol  to 100 mg daily.  ER 09/06/2023 had a headache, some confusion. CT head, CTA head and neck nonacute. Eventually brain MRI was negative.  He was discharged home.  Seen at the cardiology office 09/09/2023, rec to continue metoprolol  100 mg daily.  Subsequent monitor came back with no A-fib.   At this point, he is feeling well, good med compliance. Denies chest pain, difficulty breathing or palpitations. No further headaches. No problems with bleeding. Denies stroke symptoms such as slurred speech, facial numbness or diplopia.  Review of Systems See above   Past Medical History:  Diagnosis Date   Allergy    Anxiety    Arthritis    hands   Atrial fibrillation (HCC)    Colon cancer (HCC)    s/p partial colectomy 1990, Cscope neg 7-09, next 2014   DISORDER, MITRAL VALVE    MV recontruction at [facility] 2006----still needs ABX for SBE prophylaxis    Elevated PSA    prostate nodule, Dr Renda   GERD (gastroesophageal reflux disease)    Heart murmur    no problems since surgery   HYPERGLYCEMIA, BORDERLINE 10/16/2008   HYPERLIPIDEMIA 06/15/2006   diet controlled   Hypertension    HYPERTROPHY PROSTATE W/O UR OBST & OTH LUTS 10/16/2008   Neuropathy    Right groin pain    Dr. Tanda   SCC (squamous cell carcinoma) 06/2017   s/p Mohs---Dr Judith   Stroke [facility])    Wears partial dentures    lower partial    Past Surgical History:  Procedure Laterality Date   COLECTOMY  1990   HERNIA REPAIR Left 2014   [facility] rep w/mesh- March 2014   LOOP RECORDER  INSERTION N/A 12/01/2016   Procedure: LOOP RECORDER INSERTION;  Surgeon: Waddell Danelle ORN, MD;  Location: [facility] INVASIVE CV LAB;  Service: Cardiovascular;  Laterality: N/A;   MITRAL VALVE ANNULOPLASTY     [facility] 2006   TEE WITHOUT CARDIOVERSION N/A 10/05/2016   Procedure: TRANSESOPHAGEAL ECHOCARDIOGRAM (TEE);  Surgeon: Mona Vinie BROCKS, MD;  Location: [facility] ENDOSCOPY;  Service: Cardiovascular;  Laterality: N/A;    Current Outpatient Medications  Medication Instructions   acetaminophen  (TYLENOL ) 1,300 mg, Every 8 hours PRN   ALPRAZolam  (XANAX ) 0.25 mg, Oral, 2 times daily PRN   amLODipine  (NORVASC ) 5 MG tablet TAKE 1 TABLET BY MOUTH EVERY MORNING AND 1/2 A TABLET BY MOUTH EVERY EVENING. PLEASE KEEP APPOINTMENT FOR FURTHER REFILLS   Arginine  1,000 mg, Daily   Baclofen  5 mg, Oral, 3 times daily   Coenzyme Q10 100 MG TABS 1 tablet, 2 times daily   Eliquis  5 mg, Oral, 2 times daily   gabapentin  (NEURONTIN ) 300 MG capsule Take 2 capsules in AM and 3 capsules at night   Glucosamine-MSM-Hyaluronic Acd (JOINT HEALTH PO) 1 capsule, Daily   hydrocortisone  (ANUSOL -HC) 25 mg, Rectal, 2 times daily PRN   loratadine  (CLARITIN ) 10 mg, Daily at bedtime   metoprolol  succinate (TOPROL -XL) 100 mg, Oral, Daily, Take with or immediately following a meal   Multiple Vitamins-Minerals (OCUVITE PRESERVISION PO) 1 capsule,  2 times daily   nitroGLYCERIN  (NITROSTAT ) 0.4 mg, Sublingual, Every 5 min x3 PRN   NON FORMULARY 1-2 packets, See admin instructions   NON FORMULARY 10 mg, See admin instructions   Omega-3 Fatty Acids (FISH OIL PO) 1 capsule, 2 times daily   omeprazole  (PRILOSEC) 20 mg, Oral, Daily before breakfast   OXcarbazepine  (TRILEPTAL ) 150 MG tablet Take 1/2 tablet in AM, 1 tablet in PM   pravastatin  (PRAVACHOL ) 40 mg, Oral, Daily at bedtime   telmisartan  (MICARDIS ) 80 MG tablet TAKE 1 TABLET(80 MG) BY MOUTH DAILY   Vitamin D, Cholecalciferol, 25 MCG (1000 UT) TABS 1 tablet, Daily       Objective:   Physical  Exam BP (!) 128/58 (BP Location: Left Arm, Patient Position: Sitting, Cuff Size: Small)   Pulse (!) 57   Temp 97.6 F (36.4 C) (Oral)   Resp 16   Ht 5' 10 (1.778 m)   Wt 175 lb 12.8 oz (79.7 kg)   SpO2 99%   BMI 25.22 kg/m  General:   Well developed, NAD, BMI noted. HEENT:  Normocephalic . Face symmetric, atraumatic Lungs:  CTA B Normal respiratory effort, no intercostal retractions, no accessory muscle use. Heart: RRR,  no murmur.  Lower extremities: no pretibial edema bilaterally  Skin: Not pale. Not jaundice Neurologic:  alert & oriented X3.  Speech normal, gait appropriate for age and unassisted Psych--  Cognition and judgment appear intact.  Cooperative with normal attention span and concentration.  Behavior appropriate. No anxious or depressed appearing.      Assessment   Problem list Prediabetes HTN -Intolerant to low-dose clonidine ,high-dose amlodipine ,HCTZ dizziness Hyperlipidemia, (intolerant to simva-crestor  - cause pain.  At some point was unable to tolerate pravastatin ) Anxiety- rarely takes xanax  Neuro  -- Trigeminal neuralgia  dx 2015, no w/u, sx resurface x1 after temporary d/c of meds  --saw neuro again 02/2017, labs (-), had a NCS, Rx gaba CV: --Mitral valve annuloplasty 2006-- needs SBE prophylaxis -- SOB 04-2016, seen at [facility], ECHO Nl fx MV, cath: no CAD --Cryptogenic stroke 09/12/2016,  started plavix  -- new R PICA stroke,  11-30-16, implanted loop recorder placed --A-FIB noted on ILR ~ 12-21-2016, changed to eliquis   -- 11/2017:   L Hippocampus CVA with acute L posterior cerabral artery Elevated PSA and BPH-- Dr. Renda Colon cancer partial colectomy 1990, SCC . MOHS 06/2017  PLAN: Atrial fibrillation : since the last visit, went to the ER with A-fib with RVR, metoprolol  dose increased, subsequently saw cardiology, felt to be stable, f/u monitor did not show A-fib. He is doing well, BP is 128/58 and heart rate 57 HTN: Currently on amlodipine ,  metoprolol , Micardis .  Last BMP okay. Headache: Went to the ER with a headache, workup including brain MRI negative for acute findings, currently asymptomatic Vaccine advice provided.  Flu, COVID and RSV. RTC 4 months

## 2023-10-03 NOTE — Patient Instructions (Addendum)
 Vaccines I recommend: Flu shot this fall COVID booster this fall RSV   Check the  blood pressure regularly Blood pressure goal:  between 110/65 and  135/85. If it is consistently higher or lower, let me know     Next office visit for a checkup in 4 months Please make an appointment before you leave today

## 2023-10-03 NOTE — Assessment & Plan Note (Signed)
 Atrial fibrillation : since the last visit, went to the ER with A-fib with RVR, metoprolol  dose increased, subsequently saw cardiology, felt to be stable, f/u monitor did not show A-fib. He is doing well, BP is 128/58 and heart rate 57 HTN: Currently on amlodipine , metoprolol , Micardis .  Last BMP okay. Headache: Went to the ER with a headache, workup including brain MRI negative for acute findings, currently asymptomatic Vaccine advice provided.  Flu, COVID and RSV. RTC 4 months

## 2023-10-08 ENCOUNTER — Other Ambulatory Visit: Payer: Self-pay | Admitting: Neurology

## 2023-10-13 ENCOUNTER — Emergency Department (HOSPITAL_COMMUNITY)

## 2023-10-13 ENCOUNTER — Emergency Department (HOSPITAL_COMMUNITY)
Admission: EM | Admit: 2023-10-13 | Discharge: 2023-10-14 | Disposition: A | Discharge status: Home / Self Care | Attending: Emergency Medicine | Admitting: Emergency Medicine

## 2023-10-13 ENCOUNTER — Other Ambulatory Visit: Payer: Self-pay

## 2023-10-13 DIAGNOSIS — G454 Transient global amnesia: Secondary | ICD-10-CM | POA: Diagnosis not present

## 2023-10-13 DIAGNOSIS — I10 Essential (primary) hypertension: Secondary | ICD-10-CM | POA: Insufficient documentation

## 2023-10-13 DIAGNOSIS — R29818 Other symptoms and signs involving the nervous system: Secondary | ICD-10-CM | POA: Diagnosis not present

## 2023-10-13 DIAGNOSIS — Z8673 Personal history of transient ischemic attack (TIA), and cerebral infarction without residual deficits: Secondary | ICD-10-CM | POA: Insufficient documentation

## 2023-10-13 DIAGNOSIS — G459 Transient cerebral ischemic attack, unspecified: Secondary | ICD-10-CM | POA: Diagnosis not present

## 2023-10-13 DIAGNOSIS — Z79899 Other long term (current) drug therapy: Secondary | ICD-10-CM | POA: Diagnosis not present

## 2023-10-13 DIAGNOSIS — R413 Other amnesia: Secondary | ICD-10-CM

## 2023-10-13 DIAGNOSIS — I639 Cerebral infarction, unspecified: Secondary | ICD-10-CM | POA: Diagnosis not present

## 2023-10-13 DIAGNOSIS — R410 Disorientation, unspecified: Secondary | ICD-10-CM | POA: Diagnosis present

## 2023-10-13 DIAGNOSIS — Z85038 Personal history of other malignant neoplasm of large intestine: Secondary | ICD-10-CM | POA: Insufficient documentation

## 2023-10-13 DIAGNOSIS — Z7901 Long term (current) use of anticoagulants: Secondary | ICD-10-CM | POA: Diagnosis not present

## 2023-10-13 LAB — CBC
HCT: 39 % (ref 39.0–52.0)
Hemoglobin: 12.8 g/dL — ABNORMAL LOW (ref 13.0–17.0)
MCH: 28.3 pg (ref 26.0–34.0)
MCHC: 32.8 g/dL (ref 30.0–36.0)
MCV: 86.1 fL (ref 80.0–100.0)
Platelets: 187 K/uL (ref 150–400)
RBC: 4.53 MIL/uL (ref 4.22–5.81)
RDW: 13 % (ref 11.5–15.5)
WBC: 6.4 K/uL (ref 4.0–10.5)
nRBC: 0 % (ref 0.0–0.2)

## 2023-10-13 LAB — DIFFERENTIAL
Abs Immature Granulocytes: 0.02 K/uL (ref 0.00–0.07)
Basophils Absolute: 0.1 K/uL (ref 0.0–0.1)
Basophils Relative: 1 %
Eosinophils Absolute: 0.2 K/uL (ref 0.0–0.5)
Eosinophils Relative: 3 %
Immature Granulocytes: 0 %
Lymphocytes Relative: 27 %
Lymphs Abs: 1.8 K/uL (ref 0.7–4.0)
Monocytes Absolute: 0.5 K/uL (ref 0.1–1.0)
Monocytes Relative: 8 %
Neutro Abs: 3.9 K/uL (ref 1.7–7.7)
Neutrophils Relative %: 61 %

## 2023-10-13 LAB — I-STAT CHEM 8, ED
BUN: 19 mg/dL (ref 8–23)
Calcium, Ion: 1.02 mmol/L — ABNORMAL LOW (ref 1.15–1.40)
Chloride: 97 mmol/L — ABNORMAL LOW (ref 98–111)
Creatinine, Ser: 1.1 mg/dL (ref 0.61–1.24)
Glucose, Bld: 98 mg/dL (ref 70–99)
HCT: 39 % (ref 39.0–52.0)
Hemoglobin: 13.3 g/dL (ref 13.0–17.0)
Potassium: 4.3 mmol/L (ref 3.5–5.1)
Sodium: 132 mmol/L — ABNORMAL LOW (ref 135–145)
TCO2: 24 mmol/L (ref 22–32)

## 2023-10-13 LAB — PROTIME-INR
INR: 1.6 — ABNORMAL HIGH (ref 0.8–1.2)
Prothrombin Time: 19.9 s — ABNORMAL HIGH (ref 11.4–15.2)

## 2023-10-13 LAB — APTT: aPTT: 37 s — ABNORMAL HIGH (ref 24–36)

## 2023-10-13 LAB — CBG MONITORING, ED: Glucose-Capillary: 97 mg/dL (ref 70–99)

## 2023-10-13 MED ORDER — TRANEXAMIC ACID FOR EPISTAXIS
500.0000 mg | Freq: Once | TOPICAL | Status: DC
  Filled 2023-10-13: qty 10

## 2023-10-13 MED ORDER — SODIUM CHLORIDE 0.9% FLUSH: 3.0000 mL | Freq: Once | INTRAVENOUS | Status: DC

## 2023-10-13 NOTE — ED Triage Notes (Signed)
 Pt LSW 1950 . At 2100 PT had episode of confusion and had forgotten that he had spoken to daughter. Pt states that he didn't know where he was. These are the symptoms he had when he had his past strokes. Pt has had 3 strokes. Pt is on blood thinners. Pt had tooth extracted today. Symptoms resolved 30 minutes after it started. Pt is at baseline now.

## 2023-10-13 NOTE — ED Provider Notes (Signed)
 [place] EMERGENCY DEPARTMENT AT [facility] Provider Note  CSN: [id number] Arrival date & time: 10/13/23 2224  Chief Complaint(s) Altered Mental Status  HPI Kevin Bass is a 83 y.o. male {Add pertinent medical, surgical, social history, OB history to HPI:1}    Altered Mental Status   Past Medical History Past Medical History:  Diagnosis Date   Allergy    Anxiety    Arthritis    hands   Atrial fibrillation (HCC)    Colon cancer (HCC)    s/p partial colectomy 1990, Cscope neg 7-09, next 2014   DISORDER, MITRAL VALVE    MV recontruction at [facility] 2006----still needs ABX for SBE prophylaxis    Elevated PSA    prostate nodule, Dr Renda   GERD (gastroesophageal reflux disease)    Heart murmur    no problems since surgery   HYPERGLYCEMIA, BORDERLINE 10/16/2008   HYPERLIPIDEMIA 06/15/2006   diet controlled   Hypertension    HYPERTROPHY PROSTATE W/O UR OBST & OTH LUTS 10/16/2008   Neuropathy    Right groin pain    Dr. Tanda   SCC (squamous cell carcinoma) 06/2017   s/p Mohs---Dr Judith   Stroke [facility])    Wears partial dentures    lower partial   Patient Active Problem List   Diagnosis Date Noted   Laryngopharyngeal reflux (LPR) 09/14/2021   Chronic rhinitis 03/29/2019   TIA (transient ischemic attack) 12/03/2017   Chronic anticoagulation 12/03/2017   H/O: stroke 12/03/2017   Paroxysmal atrial fibrillation (HCC) 07/11/2017   SCC (squamous cell carcinoma) 06/22/2017   Varicose veins of both lower extremities 06/19/2017   Bilateral lower extremity edema 06/19/2017   Trichiasis without entropion of right lower eyelid 04/14/2017   Paresthesias 03/03/2017   Trigeminal neuralgia of right side of face 10/01/2016   History of mitral valve repair 09/30/2016   Cerebrovascular accident (CVA) (HCC) 09/12/2016   GERD (gastroesophageal reflux disease) 09/22/2015   PCP NOTES >>>>> 11/08/2014   Anxiety and depression 02/26/2013   Annual physical exam 08/30/2012    HTN (hypertension) 01/22/2011   BPH (benign prostatic hyperplasia) 10/16/2008   Hyperglycemia 10/16/2008   Hyperlipidemia 06/15/2006   Mitral valve disease 06/15/2006   Allergic rhinitis 06/15/2006   COLON CANCER, HX OF 06/15/2006   Home Medication(s) Prior to Admission medications   Medication Sig Start Date End Date Taking? Authorizing Provider  acetaminophen  (TYLENOL ) 650 MG CR tablet Take 1,300 mg by mouth every 8 (eight) hours as needed for pain.    [provider]  ALPRAZolam  (XANAX ) 0.25 MG tablet Take 1 tablet (0.25 mg total) by mouth 2 (two) times daily as needed for anxiety. 07/05/23   Amon Aloysius BRAVO, MD  amLODipine  (NORVASC ) 5 MG tablet TAKE 1 TABLET BY MOUTH EVERY MORNING AND 1/2 A TABLET BY MOUTH EVERY EVENING. PLEASE KEEP APPOINTMENT FOR FURTHER REFILLS 09/22/23   Mona Vinie BROCKS, MD  Arginine  500 MG CAPS Take 1,000 mg by mouth daily.     [provider]  Baclofen  5 MG TABS TAKE 1 TABLET BY MOUTH THREE TIMES DAILY 08/15/23   Georjean Darice HERO, MD  Coenzyme Q10 100 MG TABS Take 1 tablet by mouth in the morning and at bedtime.    [provider]  ELIQUIS  5 MG TABS tablet TAKE 1 TABLET BY MOUTH TWICE DAILY 05/17/23   Hilty, Vinie BROCKS, MD  gabapentin  (NEURONTIN ) 300 MG capsule Take 2 capsules in AM and 3 capsules at night 07/26/22   Georjean Darice HERO, MD  Glucosamine-MSM-Hyaluronic Acd (JOINT HEALTH PO) Take 1 capsule by mouth daily.     [provider]  hydrocortisone  (ANUSOL -HC) 25 MG suppository Place 1 suppository (25 mg total) rectally 2 (two) times daily as needed for hemorrhoids. 05/16/23   Amon Aloysius BRAVO, MD  loratadine  (CLARITIN ) 10 MG tablet Take 10 mg by mouth at bedtime.    [provider]  metoprolol  succinate (TOPROL -XL) 25 MG 24 hr tablet Take 4 tablets (100 mg total) by mouth daily. Take with or immediately following a meal 09/09/23   Terra Fairy PARAS, PA-C  Multiple Vitamins-Minerals (OCUVITE PRESERVISION PO) Take 1 capsule by mouth 2  (two) times daily.     [provider]  nitroGLYCERIN  (NITROSTAT ) 0.4 MG SL tablet Place 1 tablet (0.4 mg total) under the tongue every 5 (five) minutes x 3 doses as needed for chest pain. 12/30/21   [name], Jose E, MD  NON FORMULARY Take 1-2 packets by mouth See admin instructions. Lypo-Spheric vitamin C gel packets- Mix 1-2 packets of gel into juice one to two times a day and drink    [provider]  NON FORMULARY Take 10 mg by mouth See admin instructions. [employer] Formulas Lyco-Sorb (for Prostate & Cardiovascular Health) 10 mg capsules: Take 10 mg by mouth two times a day    [provider]  Omega-3 Fatty Acids (FISH OIL PO) Take 1 capsule by mouth in the morning and at bedtime.    [provider]  omeprazole  (PRILOSEC) 20 MG capsule Take 1 capsule (20 mg total) by mouth daily before breakfast. 08/18/23   Amon Aloysius BRAVO, MD  OXcarbazepine  (TRILEPTAL ) 150 MG tablet TAKE 1/2 TABLET BY MOUTH IN THE MORNING THEN TAKE 1 TABLET BY MOUTH IN THE EVENING 10/10/23   Georjean Darice HERO, MD  pravastatin  (PRAVACHOL ) 40 MG tablet Take 1 tablet (40 mg total) by mouth at bedtime. 12/03/22   Amon Aloysius BRAVO, MD  telmisartan  (MICARDIS ) 80 MG tablet TAKE 1 TABLET(80 MG) BY MOUTH DAILY 01/11/23   Hilty, Vinie BROCKS, MD  Vitamin D, Cholecalciferol, 25 MCG (1000 UT) TABS Take 1 tablet by mouth daily.    [provider]                                                                                                                                    Allergies Prednisolone acetate, Rosuvastatin , and Statins  Review of Systems Review of Systems As noted in HPI  Physical Exam Vital Signs  I have reviewed the triage vital signs BP (!) 148/73   Pulse (!) 59   Temp 98.1 F (36.7 C)   Resp 12   Ht 5' 10 (1.778 m)   Wt 78 kg   SpO2 100%   BMI 24.68 kg/m  *** Physical Exam  ED Results and Treatments Labs (all labs ordered are listed, but only abnormal results are displayed) Labs  Reviewed  PROTIME-INR - Abnormal;  Notable for the following components:      Result Value   Prothrombin Time 19.9 (*)    INR 1.6 (*)    All other components within normal limits  APTT - Abnormal; Notable for the following components:   aPTT 37 (*)    All other components within normal limits  CBC - Abnormal; Notable for the following components:   Hemoglobin 12.8 (*)    All other components within normal limits  I-STAT CHEM 8, ED - Abnormal; Notable for the following components:   Sodium 132 (*)    Chloride 97 (*)    Calcium , Ion 1.02 (*)    All other components within normal limits  DIFFERENTIAL  COMPREHENSIVE METABOLIC PANEL WITH GFR  ETHANOL  CBG MONITORING, ED                                                                                                                         EKG  EKG Interpretation Date/Time:    Ventricular Rate:    PR Interval:    QRS Duration:    QT Interval:    QTC Calculation:   R Axis:      Text Interpretation:         Radiology No results found.  Medications Ordered in ED Medications  sodium chloride  flush (NS) 0.9 % injection 3 mL (has no administration in time range)  tranexamic acid  (CYKLOKAPRON ) 1000 MG/10ML topical solution 500 mg (has no administration in time range)   Procedures Procedures  (including critical care time) Medical Decision Making / ED Course   Medical Decision Making Amount and/or Complexity of Data Reviewed Labs: ordered. Radiology: ordered.    ***    Final Clinical Impression(s) / ED Diagnoses Final diagnoses:  None    This chart was dictated using voice recognition software.  Despite best efforts to proofread,  errors can occur which can change the documentation meaning.

## 2023-10-13 NOTE — ED Notes (Signed)
 CCMD called by this NT.

## 2023-10-13 NOTE — ED Notes (Signed)
 Patient transported to CT

## 2023-10-14 ENCOUNTER — Emergency Department (HOSPITAL_COMMUNITY)

## 2023-10-14 DIAGNOSIS — G459 Transient cerebral ischemic attack, unspecified: Secondary | ICD-10-CM | POA: Diagnosis not present

## 2023-10-14 LAB — COMPREHENSIVE METABOLIC PANEL WITH GFR
ALT: 20 U/L (ref 0–44)
AST: 22 U/L (ref 15–41)
Albumin: 3.7 g/dL (ref 3.5–5.0)
Alkaline Phosphatase: 54 U/L (ref 38–126)
Anion gap: 9 (ref 5–15)
BUN: 16 mg/dL (ref 8–23)
CO2: 26 mmol/L (ref 22–32)
Calcium: 9.1 mg/dL (ref 8.9–10.3)
Chloride: 100 mmol/L (ref 98–111)
Creatinine, Ser: 0.99 mg/dL (ref 0.61–1.24)
GFR, Estimated: >60 mL/min (ref >60)
Glucose, Bld: 102 mg/dL — ABNORMAL HIGH (ref 70–99)
Potassium: 4.4 mmol/L (ref 3.5–5.1)
Sodium: 135 mmol/L (ref 135–145)
Total Bilirubin: 1.2 mg/dL (ref 0.0–1.2)
Total Protein: 6.7 g/dL (ref 6.5–8.1)

## 2023-10-14 LAB — ETHANOL: Alcohol, Ethyl (B): <15 mg/dL (ref <15)

## 2023-10-14 NOTE — ED Notes (Signed)
 Patient transported to MRI

## 2023-10-21 ENCOUNTER — Ambulatory Visit: Payer: PPO | Admitting: Neurology

## 2023-10-21 ENCOUNTER — Encounter: Payer: Self-pay | Admitting: Neurology

## 2023-10-21 VITALS — BP 140/80 | HR 74 | Resp 20 | Ht 70.0 in | Wt 177.0 lb

## 2023-10-21 DIAGNOSIS — G500 Trigeminal neuralgia: Secondary | ICD-10-CM

## 2023-10-21 DIAGNOSIS — G629 Polyneuropathy, unspecified: Secondary | ICD-10-CM | POA: Diagnosis not present

## 2023-10-21 DIAGNOSIS — R404 Transient alteration of awareness: Secondary | ICD-10-CM | POA: Diagnosis not present

## 2023-10-21 DIAGNOSIS — I639 Cerebral infarction, unspecified: Secondary | ICD-10-CM | POA: Diagnosis not present

## 2023-10-21 DIAGNOSIS — R413 Other amnesia: Secondary | ICD-10-CM | POA: Diagnosis not present

## 2023-10-21 MED ORDER — BACLOFEN 5 MG PO TABS: 1.0000 | ORAL_TABLET | Freq: Three times a day (TID) | ORAL | 3 refills | Status: AC

## 2023-10-21 MED ORDER — OXCARBAZEPINE 150 MG PO TABS: ORAL_TABLET | ORAL | 3 refills | Status: DC

## 2023-10-21 MED ORDER — GABAPENTIN 300 MG PO CAPS: ORAL_CAPSULE | ORAL | 3 refills | Status: DC

## 2023-10-21 NOTE — Progress Notes (Signed)
 NEUROLOGY FOLLOW UP OFFICE NOTE  Kevin Bass [id number] Jun 20, 1940  HISTORY OF PRESENT ILLNESS: I had the pleasure of seeing Kevin Bass in follow-up in the neurology clinic on 10/21/2023.  The patient was last seen over a year ago. He is alone in the office today. Records and images were personally reviewed where available.  Since his last visit, he was in the ER on 7/15 for sudden onset headache with some slight word salad that improved without intervention. He had difficulty describing symptoms in the ER, he states the confusion was not remembering things. Exam was normal. MRI brain no acute changes, remote right cerebellar and left parietal infarcts seen. He was back in the ER on 8/21 for transient memory loss. All of a sudden, he had lack of memory. He had talked to his daughter 3 hours earlier, then when he called her later on she said they had talked earlier and he had no recollection of it. He was a little bit confused but could talk and comprehend. No headache that time. MRI brain again no acute changes. He denies any sleep deprivation, illness, or strenuous activity. He denies missing medications, he goes by his list. He denies getting lost driving. He denies any staring/unresponsive episodes, olfactory/gustatory hallucinations, rising epigastric sensation, new focal symptoms (chronic right-sided neuropathy), myoclonic jerks. Trigeminal neuralgia has not been bothersome, he is on Gabapentin  total dose 1200mg  daily. He is also on oxcarbazepine  150mg  1/2 tab in AM, 1 tab in PM, and Baclofen  5mg  TID. He does not take the Xanax  daily. He denies any dizziness, vision changes, no falls.    History on Initial Assessment 10/01/2016: This is a pleasant 83 yo RH man with a history of colon cancer, hyperlipidemia, previous mitral valve repair in his usual state of health until 09/11/16 after taking a walk, he saw his brother-in-law and when he started noticing word-finding difficulties. His  wife reported that his speech was not necessarily slurred but he was having trouble finding his words. When symptoms were unchanged the next day, he decided to go to the ER. He was still noted to have mild word-finding difficulties. There was no focal numbness/tingling/weakness. He denied any headaches, dizziness, diplopia, dysarthria/dysphagia. I personally reviewed MRI brain without contrast which showed an acute infarct in the left posterior insula and parietal operculum. MRA head and carotid dopplers did not show any significant stenosis. Echocardiogram showed EF 60-65%, mild LVH, prior MV repair without significant stenosis or regurgitation, normal LA size. He was switched from aspirin  to Plavix . LDL was 124, HbA1c was 5.6. He was unable to get a TEE in the hospital, and has seen Dr. Mona and agreed to having the procedure done on 8/14 followed by loop recorder. He denies any palpitations, chest pain, shortness of breath. No neck/back pain, bowel/bladder dysfunction. No falls. He denies any side effect on Plavix . He was started on Crestor  yesterday. He feels he is 95% or more back to baseline, he denies any confusion or focal symptoms. He reports a history of facial pain on the right upper lip radiating up his cheek with good response to gabapentin . He takes 300mg  qhs with no side effects.   Update 12/07/16: He had no residual deficits from prior stroke, then on 11/29/16 he had sudden onset vertigo that made him fall backward. This lasted for 15 seconds and was followed by a mild headache. He called triage and was instructed to go seek medical attention the next day. He went to the ER and was found  to have a right PICA territory stroke. He was evaluated by Neurology with no focal deficits seen. He reports he was taking aspirin  and Plavix  when the stroke occurred. He had an extensive workup for the prior stroke, he had a TEE in August 2018 which showed normal left atrium, no thrombus seen. He had a 48-hour  monitor which was unremarkable. In the hospital, he had a CTA of the head and neck which did not show any occlusion or stenosis. Repeat TTE showed EF of 55-60%, left atrium mildly dilated. He had a CT chest/abdomen/pelvis which did not show any evidence of malignancy. On hospital discharge, he was instructed to continue aspirin  and Plavix  and stop his antihypertensives. He had a loop recorder placed.   Update 12/06/2017: He underwent right median nerve and ulnar nerve decompression on 11/28/17. He had stopped the Eliquis  for 2 days for the procedure, and restarted Eliquis  on 11/29/17. On 12/02/17, he took a nap and woke up with right arm weakness and numbness. He could not lift his right arm up. After 20 minutes, he started getting more feeling in his arm. Per records, he stood up and noticed his gait was off. The weakness improved after several minutes, but he continued to feel off balance. He went to the ER where SBP was elevated at 170. NIH 0 in the hospital. Records were reviewed, he had an MRI brain without contrast showing an acute infarct in the left hippocampus, occlusion of the left PCA which could be acute. There were chronic infarcts in the right cerebellum and left parietal operculum. He had an echo which showed an EF of 55-60%, left atrium normal, right atrium moderately dilated. No significant stenosis on carotid dopplers. LDL in August 2019 was 120, he was advised to increase Crestor  to 40mg  daily. He was having muscle pains and stopped statin for 5 months, he was restarted Crestor  with Zetia  last August.  He was discharged home 2 days ago and reports that this is the worst of his strokes. It has affected his memory more than anything. He has also noticed vision changes on the right visual field, he describes blurred/double vision reading on his right side. He feels his strength is back but his balance is still off. He also has left foot pain. He has been dealing with his wife and sister's recent  deaths.    Diagnostic Data: Neuropathy labs were normal (TSH, B12, ESR, folate, SPEP/IFE). He had an EMG/NCV of the right UE and LE which showed right ulnar neuropathy with slowing across the elbow, axon loss and demyelinating in type, mild to moderate in degree; right median neuropathy at or distal to the wrist, consistent with carpal tunnel, mild in degree, and probable L5 radiculopathy, mild. No evidence of a large fiber sensorimotor polyneuropathy.     PAST MEDICAL HISTORY: Past Medical History:  Diagnosis Date   Allergy    Anxiety    Arthritis    hands   Atrial fibrillation (HCC)    Colon cancer (HCC)    s/p partial colectomy 1990, Cscope neg 7-09, next 2014   DISORDER, MITRAL VALVE    MV recontruction at [facility] 2006----still needs ABX for SBE prophylaxis    Elevated PSA    prostate nodule, Dr Renda   GERD (gastroesophageal reflux disease)    Heart murmur    no problems since surgery   HYPERGLYCEMIA, BORDERLINE 10/16/2008   HYPERLIPIDEMIA 06/15/2006   diet controlled   Hypertension    HYPERTROPHY PROSTATE W/O UR OBST &  OTH LUTS 10/16/2008   Neuropathy    Right groin pain    Dr. Tanda   SCC (squamous cell carcinoma) 06/2017   s/p Mohs---Dr Judith   Stroke [facility])    Wears partial dentures    lower partial    MEDICATIONS: Current Outpatient Medications on File Prior to Visit  Medication Sig Dispense Refill   acetaminophen  (TYLENOL ) 650 MG CR tablet Take 1,300 mg by mouth every 8 (eight) hours as needed for pain.     ALPRAZolam  (XANAX ) 0.25 MG tablet Take 1 tablet (0.25 mg total) by mouth 2 (two) times daily as needed for anxiety. 60 tablet 2   amLODipine  (NORVASC ) 5 MG tablet TAKE 1 TABLET BY MOUTH EVERY MORNING AND 1/2 A TABLET BY MOUTH EVERY EVENING. PLEASE KEEP APPOINTMENT FOR FURTHER REFILLS 135 tablet 0   Arginine  500 MG CAPS Take 1,000 mg by mouth daily.      Baclofen  5 MG TABS TAKE 1 TABLET BY MOUTH THREE TIMES DAILY 270 tablet 0   Coenzyme Q10 100 MG TABS Take 1  tablet by mouth in the morning and at bedtime.     ELIQUIS  5 MG TABS tablet TAKE 1 TABLET BY MOUTH TWICE DAILY 180 tablet 1   gabapentin  (NEURONTIN ) 300 MG capsule Take 2 capsules in AM and 3 capsules at night 450 capsule 3   Glucosamine-MSM-Hyaluronic Acd (JOINT HEALTH PO) Take 1 capsule by mouth daily.      hydrocortisone  (ANUSOL -HC) 25 MG suppository Place 1 suppository (25 mg total) rectally 2 (two) times daily as needed for hemorrhoids. 12 suppository 1   loratadine  (CLARITIN ) 10 MG tablet Take 10 mg by mouth at bedtime.     metoprolol  succinate (TOPROL -XL) 25 MG 24 hr tablet Take 4 tablets (100 mg total) by mouth daily. Take with or immediately following a meal 360 tablet 1   Multiple Vitamins-Minerals (OCUVITE PRESERVISION PO) Take 1 capsule by mouth 2 (two) times daily.      nitroGLYCERIN  (NITROSTAT ) 0.4 MG SL tablet Place 1 tablet (0.4 mg total) under the tongue every 5 (five) minutes x 3 doses as needed for chest pain. 25 tablet 3   NON FORMULARY Take 1-2 packets by mouth See admin instructions. Lypo-Spheric vitamin C gel packets- Mix 1-2 packets of gel into juice one to two times a day and drink     NON FORMULARY Take 10 mg by mouth See admin instructions. [employer] Formulas Lyco-Sorb (for Prostate & Cardiovascular Health) 10 mg capsules: Take 10 mg by mouth two times a day     Omega-3 Fatty Acids (FISH OIL PO) Take 1 capsule by mouth in the morning and at bedtime.     omeprazole  (PRILOSEC) 20 MG capsule Take 1 capsule (20 mg total) by mouth daily before breakfast. 90 capsule 1   OXcarbazepine  (TRILEPTAL ) 150 MG tablet TAKE 1/2 TABLET BY MOUTH IN THE MORNING THEN TAKE 1 TABLET BY MOUTH IN THE EVENING 135 tablet 0   pravastatin  (PRAVACHOL ) 40 MG tablet Take 1 tablet (40 mg total) by mouth at bedtime. 90 tablet 1   telmisartan  (MICARDIS ) 80 MG tablet TAKE 1 TABLET(80 MG) BY MOUTH DAILY 90 tablet 3   Vitamin D, Cholecalciferol, 25 MCG (1000 UT) TABS Take 1 tablet by mouth daily.     No current  facility-administered medications on file prior to visit.    ALLERGIES: Allergies  Allergen Reactions   Prednisolone Acetate     Increased intraocular pressure   Rosuvastatin      Muscle, arm  and foot pain   Statins Other (See Comments)    Myalgias (intolerance) Simvastatin, atorvastatin, pravastatin     FAMILY HISTORY: Family History  Problem Relation Age of Onset   Colon cancer Other        M side (cousins)   Diabetes Neg Hx    Stroke Neg Hx    CAD Neg Hx    Prostate cancer Neg Hx    Rectal cancer Neg Hx    Esophageal cancer Neg Hx     SOCIAL HISTORY: Social History   Socioeconomic History   Marital status: Widowed    Spouse name: suzanne   Number of children: 2   Years of education: college   Highest education level: Not on file  Occupational History   Occupation: retired   Tobacco Use   Smoking status: Former    Current packs/day: 0.00    Average packs/day: 0.5 packs/day for 20.0 years (10.0 ttl pk-yrs)    Types: Cigarettes    Start date: 02/23/1963    Quit date: 02/23/1983    Years since quitting: 40.6   Smokeless tobacco: Never   Tobacco comments:    Former smoker 09/09/23  Vaping Use   Vaping status: Never Used  Substance and Sexual Activity   Alcohol use: Yes    Alcohol/week: 7.0 standard drinks of alcohol    Types: 7 Glasses of wine per week   Drug use: No   Sexual activity: Not Currently  Other Topics Concern   Not on file  Social History Narrative   From [place], lost wife 08/2017 to cancer   Lives by himself    Daughter in [place], 1 granddaughter   Son in [place], 2 grandchildren   Right handed      Social Drivers of Health   Financial Resource Strain: Low Risk  (11/29/2022)   Overall Financial Resource Strain (CARDIA)    Difficulty of Paying Living Expenses: Not very hard  Food Insecurity: No Food Insecurity (12/30/2022)   Hunger Vital Sign    Worried About Running Out of Food in the Last Year: Never true    Ran Out of Food in the Last Year:  Never true  Transportation Needs: Unknown (11/29/2022)   PRAPARE - Administrator, Civil Service (Medical): Not on file    Lack of Transportation (Non-Medical): No  Physical Activity: Sufficiently Active (12/30/2022)   Exercise Vital Sign    Days of Exercise per Week: 7 days    Minutes of Exercise per Session: 30 min  Stress: No Stress Concern Present (12/30/2022)   [employer] of Occupational Health - Occupational Stress Questionnaire    Feeling of Stress : Not at all  Social Connections: Socially Isolated (12/30/2022)   Social Connection and Isolation Panel    Frequency of Communication with Friends and Family: More than three times a week    Frequency of Social Gatherings with Friends and Family: More than three times a week    Attends Religious Services: Never    Database administrator or Organizations: No    Attends Banker Meetings: Never    Marital Status: Widowed  Intimate Partner Violence: Not At Risk (12/30/2022)   Humiliation, Afraid, Rape, and Kick questionnaire    Fear of Current or Ex-Partner: No    Emotionally Abused: No    Physically Abused: No    Sexually Abused: No     PHYSICAL EXAM: Vitals:   10/21/23 1427  BP: (!) 140/80  Pulse: 74  Resp:  20  SpO2: 98%   General: No acute distress Head:  Normocephalic/atraumatic Skin/Extremities: No rash, no edema Neurological Exam: alert and awake. No aphasia or dysarthria. Fund of knowledge is appropriate.   Attention and concentration are normal.  .mms Cranial nerves: Pupils equal, round. Extraocular movements intact with no nystagmus. Visual fields full.  No facial asymmetry.  Motor: Bulk and tone normal, muscle strength 5/5 throughout with no pronator drift.   Finger to nose testing intact.  Gait narrow-based and steady, no ataxia.  Romberg negative.   IMPRESSION: This is a pleasant 83 yo RH man with a history of colon cancer, hyperlipidemia, previous mitral valve repair with recurrent  strokes (x3) secondary to atrial fibrillation, neuropathy, trigeminal neuralgia. He has been to the ER twice in the past month for confusion/transient memory loss. He had a headache with one of them. MRI brain x 2 no acute changes, remote right cerebellar and left parietal infarcts. With recurrent symptoms, we discussed doing a 1-hour EEG. Schedule Neurocognitive testing. We discussed how Gabapentin  (and Baclofen ) can affect cognition, since trigeminal neuralgia has been stable, he will try to gradually reduce dose. Continue oxcarbazepine  and Baclofen  for now. Follow-up after tests, call for any changes.     Thank you for allowing me to participate in his care.  Please do not hesitate to call for any questions or concerns.    Darice Shivers, M.D.   CC: Dr. Amon

## 2023-10-21 NOTE — Patient Instructions (Addendum)
 Good to see you.  Schedule 1-hour EEG  2. Schedule Neurocognitive testing  3. Try gradually reducing Gabapentin  and monitor symptoms, continue all your other medications  4. Follow-up after tests, call for any changes   You have been referred for a neurocognitive evaluation in our office.   The evaluation has two parts.   The first part of the evaluation is a clinical interview with the neuropsychologist (Dr. Richie or Dr. Gayland). Please bring someone with you to this appointment if possible, as it is helpful for the doctor to hear from both you and another adult who knows you well.   The second part of the evaluation is testing with the doctor's technician (Dana or Volo). The testing includes a variety of tasks- mostly question-and-answer, some paper-and-pencil. There is nothing you need to do to prepare for this appointment, but having a good night's sleep prior to the testing, taking medications as you normally would, and bringing eyeglasses and hearing aids (if you wear them), is advised. Please make sure that you wear a mask to the appointment.  Please note: We have to reserve several hours of the neuropsychologist's time and the psychometrician's time for your evaluation appointment. As such, please note that there is a No-Show fee of $100. If you are unable to attend any of your appointments, please contact our office as soon as possible to reschedule.

## 2023-10-30 ENCOUNTER — Encounter: Payer: Self-pay | Admitting: Neurology

## 2023-10-31 ENCOUNTER — Ambulatory Visit (INDEPENDENT_AMBULATORY_CARE_PROVIDER_SITE_OTHER): Admitting: Neurology

## 2023-10-31 DIAGNOSIS — R404 Transient alteration of awareness: Secondary | ICD-10-CM | POA: Diagnosis not present

## 2023-10-31 NOTE — Progress Notes (Unsigned)
 EEG complete and ready for review.

## 2023-11-01 ENCOUNTER — Ambulatory Visit: Payer: Self-pay | Admitting: Neurology

## 2023-11-01 NOTE — Procedures (Signed)
 ELECTROENCEPHALOGRAM REPORT  Date of Study: 10/31/2023  Patient's Name: Kevin Bass MRN: [id number] Date of Birth: [birth year]  Referring Provider: Dr. Darice Shivers  Clinical History: This is an 83 year old man with history of recurrent strokes with 2 episodes of confusion/transient memory loss. EEG for classification.  CNS Active Medications: Xanax , Gabapentin , Oxcarbazepine , Baclofen   Technical Summary: A multichannel digital 1-hour EEG recording measured by the international 10-20 system with electrodes applied with paste and impedances below 5000 ohms performed in our laboratory with EKG monitoring in an awake and asleep patient.  Hyperventilation was not performed. Photic stimulation was performed.  The digital EEG was referentially recorded, reformatted, and digitally filtered in a variety of bipolar and referential montages for optimal display.    Description: The patient is awake and asleep during the recording.  During maximal wakefulness, there is a symmetric, medium voltage 8 Hz posterior dominant rhythm that attenuates with eye opening.  The record is symmetric.  During drowsiness and sleep, there is an increase in theta slowing of the background with shifting asymmetry over the bilateral temporal regions.  Vertex waves and symmetric sleep spindles were seen. Photic stimulation did not elicit any abnormalities.  There were no epileptiform discharges or electrographic seizures seen.    EKG lead showed occasional extrasystolic beats.  Impression: This 1-hour awake and asleep EEG is normal.    Clinical Correlation: A normal EEG does not exclude a clinical diagnosis of epilepsy.  If further clinical questions remain, prolonged EEG may be helpful.  Clinical correlation is advised.   Darice Shivers, M.D.

## 2023-11-03 ENCOUNTER — Ambulatory Visit: Attending: Internal Medicine | Admitting: Internal Medicine

## 2023-11-03 ENCOUNTER — Encounter: Payer: Self-pay | Admitting: Internal Medicine

## 2023-11-03 VITALS — BP 125/70 | HR 59 | Ht 70.0 in | Wt 175.1 lb

## 2023-11-03 DIAGNOSIS — E785 Hyperlipidemia, unspecified: Secondary | ICD-10-CM

## 2023-11-03 DIAGNOSIS — I480 Paroxysmal atrial fibrillation: Secondary | ICD-10-CM

## 2023-11-03 DIAGNOSIS — I10 Essential (primary) hypertension: Secondary | ICD-10-CM

## 2023-11-03 DIAGNOSIS — Z9889 Other specified postprocedural states: Secondary | ICD-10-CM | POA: Diagnosis not present

## 2023-11-03 DIAGNOSIS — Z7901 Long term (current) use of anticoagulants: Secondary | ICD-10-CM | POA: Diagnosis not present

## 2023-11-03 DIAGNOSIS — Z8673 Personal history of transient ischemic attack (TIA), and cerebral infarction without residual deficits: Secondary | ICD-10-CM | POA: Diagnosis not present

## 2023-11-03 NOTE — Patient Instructions (Signed)
 Medication Instructions:  Your physician recommends that you continue on your current medications as directed. Please refer to the Current Medication list given to you today.  *If you need a refill on your cardiac medications before your next appointment, please call your pharmacy*  Lab Work: None ordered.  If you have labs (blood work) drawn today and your tests are completely normal, you will receive your results only by: MyChart Message (if you have MyChart) OR A paper copy in the mail If you have any lab test that is abnormal or we need to change your treatment, we will call you to review the results.  Testing/Procedures: None ordered.   Follow-Up: At [facility], you and your health needs are our priority.  As part of our continuing mission to provide you with exceptional heart care, our providers are all part of one team.  This team includes your primary Cardiologist (physician) and Advanced Practice Providers or APPs (Physician Assistants and Nurse Practitioners) who all work together to provide you with the care you need, when you need it.  Your next appointment:   6 months with Dr Katharina APP

## 2023-11-03 NOTE — Progress Notes (Signed)
 OFFICE CONSULT NOTE  Chief Complaint:  No complaints  Primary Care Physician: Mona Vinie BROCKS, MD  HPI:  Kevin Bass is a 83 y.o. male who is being seen today for the evaluation of workup of cryptogenic stroke at the request of Amon Aloysius BRAVO, MD. Kevin Bass is a pleasant 83 year old patient who recently had an unfortunate cryptogenic stroke. His past ocular history significant for mitral valve disease status post repair at [facility]. He is followed by cardiologist there and recently was admitted to [facility] with cryptogenic stroke. As per routine care or transesophageal echocardiogram as well as a implanted loop recorder were recommended. Unfortunately there was difficulty during the transesophageal echocardiogram procedure which involved technical difficulties with equipment and it was not able to be completed. There was some concern that the probe could not be passed adequately and he underwent upper esophageal imaging which was normal. He has had prior teas without any difficulty. After that he decided against a loop recorder and wanted to get another opinion from his cardiologist at [facility]. There was a visit there which did not indicate that a transesophageal echocardiogram would be helpful. I do not see any evidence on his prior echocardiograms of a bubble study to exclude PFO. This could be a significant finding as there is evidence now that closure of a small PFO in the setting of a patient whose had a cryptogenic stroke is guideline indicated. It's also possible that he could have atrial fibrillation, especially given his history of mitral valve disease and valve repair which is a significant risk factor for arrhythmias.  12/21/2016  Kevin Bass was seen today in follow-up.  He underwent a repeat echocardiogram which did not demonstrate any LV thrombus or evidence for atrial septal defect.  Subsequently he had placement of an implanted loop recorder by Dr. Waddell which was  seen in follow-up and appears to be well-healed.  There is no evidence today of any significant arrhythmias such as atrial fibrillation.  He is currently on aspirin  and Plavix  for stroke prevention.  He is also had increasing doses of medication to control hypertension.  Currently his blood pressure is well controlled at 108/70.  He is appropriately on rosuvastatin , ezetimibe  and fish oil with an LDL-C is 60 approximately 3 weeks ago.  06/17/2017  Kevin Bass returns today for follow-up.  Overall he says he feels fairly well.  He denies any significant palpitations or recurrent atrial fibrillation.  EKG today shows normal sinus rhythm at 62.  He has been working with Dr. Georjean for neuropathy.  Is currently on very high dose of gabapentin  (a total of 1500 mg daily).  He is tolerating apixaban  without any bleeding difficulty and cholesterol has been well controlled on rosuvastatin  and ezetimibe .  Only, today he is complaining of some lower extremity discomfort and minimal edema.  He says this is worse on long car rides.  He does have notable bilateral lower extremity varicosities, and would likely benefit from compression stockings.  07/11/2017  Kevin Bass was seen today in follow-up.  I last saw him about a month ago.  He was doing well without any symptomatic A. fib.  His A. fib was detected by implanted loop recorder.  He has been on anticoagulation.  Most recently he was in the emergency department for symptomatic atrial fibrillation with heart rate as high as 200 when he was seen by EMS.  He was treated and seen in the emergency department.  He was started on metoprolol  and  converted back to sinus rhythm.  He had questions about the value of the implanted loop recorder, however I reminded him that his job was not to notify you of arrhythmias as they were happening.    03/07/2018  Kevin Bass returns today for follow-up.  Recently he has been reported labile blood pressure.  He notes that he is  having spikes in his blood pressure mostly in the afternoons at times up to 170 although has some lower blood pressures in the morning.  I personally reviewed his blood pressure cuff (which incidentally is close to 85 years old) and straining some lability in his blood pressures.  Currently he is taking 75 mg of metoprolol  in the morning along with 50 mg of losartan  and was also prescribed to take an additional hydrochlorothiazide .  He said the hydrochlorothiazide  was making him dizzy and therefore he has stopped the medication.  Of note he did have a recurrent TIA affecting the right PICA territory.  There was some associated transient vertigo.  This apparently happened after holding his Eliquis  for 2 days due to a scheduled median and ulnar nerve decompression.  It was felt that A. fib was the cause of this however it seems fairly unlikely that he is developed thrombus during that period of time to cause stroke.  I do believe there is likely another explanation for this cause.  I do not want him to feel that he could never come off of blood thinners because of the risk for stroke.  09/21/2018  Mr. Rugerrio was seen today in follow-up.  Overall he seems to be doing better.  Fortunately had no further strokes after that episode where he discontinued his Eliquis  for 4 doses.  He has decreased his Toprol -XL down to 25 mg from 75 mg daily.  Blood pressure is well controlled and heart rate is in the low 60s.  He continues to have remote checks without any significant findings.  The plan is to allow the device to run out of battery life and then he may consider either explantation or just leaving it.  He is on Eliquis  without any bleeding complications.  He last had an echo in October 2019 which showed stability of his mitral valve repair.  05/29/2019  Kevin Bass returns today for follow-up.  Overall he says he is feeling well.  Denies any chest pain or worsening shortness of breath.  Recent lipids are excellent  in fact has had a marked reduction in his LDL cholesterol.  He says it previously was because he had stopped taking his statin and was only on the Zetia  due to concern for side effects however he felt like he was not having significant side effects and therefore went back on the medicine.  More recently total cholesterol is now 91, triglycerides 127, HDL 40 and LDL of 26 (reduced from 120).  Blood pressure is well controlled today.  He is blood pressure though is labile and by history his PCP has adjusted his medications.  Specifically his losartan  has been decreased to 75 mg daily in the morning and metoprolol  XL has decreased to 75 mg as well.  He denies any further TIA events.  He denies any bleeding difficulty on Eliquis .  01/22/2022  Kevin Bass is seen today in follow-up.  Overall he says he is doing pretty well.  He was seen by my partner last year for some chest pain.  She recommended a stress test but he was hesitant to proceed.  He spoke  with me on the phone and I recommended we go ahead and do that.  Fortunately came back negative.  This year he was in the emergency department in October with elevated blood pressure.  He says occasionally gets spikes with this.  Since then however it has been better regulated.  He denies any chest pain or shortness of breath.  04/21/2022  Kevin Bass returns today for follow-up.  He has been concerned about elevated blood pressures at home.  He works out to his primary care provider about this who recommended an increase in his clonidine  up to 0.1 mg 3 times daily.  He has had side effects with this and is currently only taking 0.1 mg at night in addition to 5 mg of amlodipine  and he takes losartan  75 mg in the morning and 25 mg at night along with Toprol -XL 75 mg daily.  In general he says his blood pressures are better in the morning although a number of his readings have been elevated.  Most of his blood pressures seem high between 152 up to 170 systolic.   Blood pressure today however was normal at 120/60.  He denies any issues with his cough and says he has 2 blood pressure cuffs which he feels are fairly recent and accurate.  12/22/2022  Kevin Bass is seen today in follow-up.  Overall he says he is feeling well.  He recently saw Dr. Amon for a physical.  He had lab work which indicated that his cholesterol was still above target with an LDL of 95.  It was advised to increase his pravastatin  up to 40 mg daily.  He remains on the Eliquis  without any bleeding issues.  He has had 3 episodes of tachycardia for which he took Xanax  and had relief of those symptoms.  Overall though his heart rate is usually well-controlled at bradycardic either in the 50s or 60s as and has been today.  Blood pressure appears to be well-controlled at 120/80 although her blood pressure readings which I reviewed range between 115-145 depending of the day.  He had not yet scheduled his echo until recently but is scheduled to have a repeat echo next week to reassess his mitral valve repair.  11/03/2023  Kevin Bass is here today for follow-up.  Recently has had a number of issues related to neurologic deficits.  He recently underwent EEG and and other testing and is scheduled to have neuropsychiatric testing coming up.  He was recently seen in the A-fib clinic and underwent monitoring which failed to show any A-fib over 2-week period.  He had an echo in November last year which showed stable mitral valve and low normal LVEF.  He was seen in the ER for TIA in August 2025 and also with headaches in July prior to that.  He reports his blood pressure is now more stable.  I demonstrated the use of his Apple Watch in the office today to do point-of-care EKG which she was unaware that he was able to do and this demonstrated a sinus rhythm  PMHx:  Past Medical History:  Diagnosis Date   Allergy    Anxiety    Arthritis    hands   Atrial fibrillation (HCC)    Colon cancer (HCC)    s/p  partial colectomy 1990, Cscope neg 7-09, next 2014   DISORDER, MITRAL VALVE    MV recontruction at [facility] 2006----still needs ABX for SBE prophylaxis    Elevated PSA    prostate nodule, Dr  Borden   GERD (gastroesophageal reflux disease)    Heart murmur    no problems since surgery   HYPERGLYCEMIA, BORDERLINE 10/16/2008   HYPERLIPIDEMIA 06/15/2006   diet controlled   Hypertension    HYPERTROPHY PROSTATE W/O UR OBST & OTH LUTS 10/16/2008   Neuropathy    Right groin pain    Dr. Tanda   SCC (squamous cell carcinoma) 06/2017   s/p Mohs---Dr Judith   Stroke [facility])    Wears partial dentures    lower partial    Past Surgical History:  Procedure Laterality Date   COLECTOMY  1990   HERNIA REPAIR Left 2014   [facility] rep w/mesh- March 2014   LOOP RECORDER INSERTION N/A 12/01/2016   Procedure: LOOP RECORDER INSERTION;  Surgeon: Waddell Danelle ORN, MD;  Location: [facility] INVASIVE CV LAB;  Service: Cardiovascular;  Laterality: N/A;   MITRAL VALVE ANNULOPLASTY     [facility] 2006   TEE WITHOUT CARDIOVERSION N/A 10/05/2016   Procedure: TRANSESOPHAGEAL ECHOCARDIOGRAM (TEE);  Surgeon: Mona Vinie BROCKS, MD;  Location: [facility] ENDOSCOPY;  Service: Cardiovascular;  Laterality: N/A;    FAMHx:  Family History  Problem Relation Age of Onset   Colon cancer Other        M side (cousins)   Diabetes Neg Hx    Stroke Neg Hx    CAD Neg Hx    Prostate cancer Neg Hx    Rectal cancer Neg Hx    Esophageal cancer Neg Hx     SOCHx:   reports that he quit smoking about 40 years ago. His smoking use included cigarettes. He started smoking about 60 years ago. He has a 10 pack-year smoking history. He has never used smokeless tobacco. He reports current alcohol use of about 7.0 standard drinks of alcohol per week. He reports that he does not use drugs.  ALLERGIES:  Allergies  Allergen Reactions   Prednisolone Acetate     Increased intraocular pressure   Rosuvastatin      Muscle, arm and foot pain   Statins Other (See Comments)     Myalgias (intolerance) Simvastatin, atorvastatin, pravastatin     ROS: Pertinent items noted in HPI and remainder of comprehensive ROS otherwise negative.  HOME MEDS: Current Outpatient Medications on File Prior to Visit  Medication Sig Dispense Refill   acetaminophen  (TYLENOL ) 650 MG CR tablet Take 1,300 mg by mouth every 8 (eight) hours as needed for pain.     ALPRAZolam  (XANAX ) 0.25 MG tablet Take 1 tablet (0.25 mg total) by mouth 2 (two) times daily as needed for anxiety. 60 tablet 2   amLODipine  (NORVASC ) 5 MG tablet TAKE 1 TABLET BY MOUTH EVERY MORNING AND 1/2 A TABLET BY MOUTH EVERY EVENING. PLEASE KEEP APPOINTMENT FOR FURTHER REFILLS 135 tablet 0   Arginine  500 MG CAPS Take 1,000 mg by mouth daily.      Baclofen  5 MG TABS Take 1 tablet (5 mg total) by mouth 3 (three) times daily. 270 tablet 3   Coenzyme Q10 100 MG TABS Take 1 tablet by mouth in the morning and at bedtime.     ELIQUIS  5 MG TABS tablet TAKE 1 TABLET BY MOUTH TWICE DAILY 180 tablet 1   gabapentin  (NEURONTIN ) 300 MG capsule Take 2 capsules in AM and 3 capsules at night 450 capsule 3   Glucosamine-MSM-Hyaluronic Acd (JOINT HEALTH PO) Take 1 capsule by mouth daily.      hydrocortisone  (ANUSOL -HC) 25 MG suppository Place 1 suppository (25 mg total) rectally 2 (two) times daily  as needed for hemorrhoids. 12 suppository 1   loratadine  (CLARITIN ) 10 MG tablet Take 10 mg by mouth at bedtime.     metoprolol  succinate (TOPROL -XL) 25 MG 24 hr tablet Take 4 tablets (100 mg total) by mouth daily. Take with or immediately following a meal 360 tablet 1   Multiple Vitamins-Minerals (OCUVITE PRESERVISION PO) Take 1 capsule by mouth 2 (two) times daily.      nitroGLYCERIN  (NITROSTAT ) 0.4 MG SL tablet Place 1 tablet (0.4 mg total) under the tongue every 5 (five) minutes x 3 doses as needed for chest pain. 25 tablet 3   NON FORMULARY Take 1-2 packets by mouth See admin instructions. Lypo-Spheric vitamin C gel packets- Mix 1-2 packets of  gel into juice one to two times a day and drink     NON FORMULARY Take 10 mg by mouth See admin instructions. [employer] Lyco-Sorb (for Prostate & Cardiovascular Health) 10 mg capsules: Take 10 mg by mouth two times a day     Omega-3 Fatty Acids (FISH OIL PO) Take 1 capsule by mouth in the morning and at bedtime.     omeprazole  (PRILOSEC) 20 MG capsule Take 1 capsule (20 mg total) by mouth daily before breakfast. 90 capsule 1   OXcarbazepine  (TRILEPTAL ) 150 MG tablet TAKE 1/2 TABLET BY MOUTH IN THE MORNING THEN TAKE 1 TABLET BY MOUTH IN THE EVENING 135 tablet 3   pravastatin  (PRAVACHOL ) 40 MG tablet Take 1 tablet (40 mg total) by mouth at bedtime. 90 tablet 1   telmisartan  (MICARDIS ) 80 MG tablet TAKE 1 TABLET(80 MG) BY MOUTH DAILY 90 tablet 3   Vitamin D, Cholecalciferol, 25 MCG (1000 UT) TABS Take 1 tablet by mouth daily.     No current facility-administered medications on file prior to visit.    LABS/IMAGING: No results found for this or any previous visit (from the past 48 hours). No results found.  LIPID PANEL:    Component Value Date/Time   CHOL 137 04/04/2023 1433   TRIG 107.0 04/04/2023 1433   HDL 44.80 04/04/2023 1433   CHOLHDL 3 04/04/2023 1433   VLDL 21.4 04/04/2023 1433   LDLCALC 71 04/04/2023 1433   LDLCALC 51 08/24/2021 1352   LDLDIRECT 149.1 08/30/2012 1352    WEIGHTS: Wt Readings from Last 3 Encounters:  11/03/23 175 lb 1.6 oz (79.4 kg)  10/21/23 177 lb (80.3 kg)  10/13/23 172 lb (78 kg)    VITALS: BP 125/70 (BP Location: Left Arm, Patient Position: Sitting)   Pulse (!) 59   Ht 5' 10 (1.778 m)   Wt 175 lb 1.6 oz (79.4 kg)   SpO2 98%   BMI 25.12 kg/m   EXAM: General appearance: alert and no distress Neck: no carotid bruit, no JVD, and thyroid  not enlarged, symmetric, no tenderness/mass/nodules Lungs: clear to auscultation bilaterally Heart: regular rate and rhythm, S1, S2 normal, and systolic murmur: early systolic 2/6, blowing at apex Abdomen:  soft, non-tender; bowel sounds normal; no masses,  no organomegaly Extremities: extremities normal, atraumatic, no cyanosis or edema and varicose veins noted Pulses: 2+ and symmetric Skin: Skin color, texture, turgor normal. No rashes or lesions Neurologic: Grossly normal Psych: Pleasant  EKG: Deferred  ASSESSMENT: Labile blood pressure Recurrent stroke Status post mitral valve repair, stable with low normal LVEF 50 to 55% (12/2022) A-fib - S/p ILR CHADSVASC Score of 4 - on Eliquis  Essential hypertension Dyslipidemia, goal LDL less than 70 TIA  PLAN: 1.   Mr. Tietze recently has had some  possible TIA symptoms or memory issues.  He has a history of recurrent stroke.  He is followed closely by neurology.  His blood pressure seems to be more stable.  His mitral valve repair appears stable as well based on his last echo in November although his LVEF was low normal, however I do not think this is contributing to the TIA events.  He remains anticoagulated on Eliquis .  Recent monitoring failed to show any recurrent A-fib.  Blood pressure is well-controlled.  LDL is near target at 71 in February 2025.  No medication changes today.  Plan follow-up in 6 months with APP or sooner as necessary.  Vinie KYM Maxcy, MD, [facility], FNLA, FACP  [place]  [facility]  Medical Director of the Advanced Lipid Disorders &  Cardiovascular Risk Reduction Clinic Diplomate of the American Board of Clinical Lipidology Attending Cardiologist  Direct Dial: [phone]  Fax: [phone]  Website:  [email or web address] Vinie [email or web address] Jackilyn [email or web address] 11/03/2023, 10:50 AM

## 2023-11-04 ENCOUNTER — Other Ambulatory Visit: Payer: Self-pay | Admitting: Internal Medicine

## 2023-11-04 DIAGNOSIS — I480 Paroxysmal atrial fibrillation: Secondary | ICD-10-CM

## 2023-11-04 NOTE — Telephone Encounter (Signed)
 Prescription refill request for Eliquis  received. Indication: a fib Last office visit: 11/03/23 Scr: 1.1 epic 10/13/23 Age: 83 Weight: 79kg

## 2023-11-11 ENCOUNTER — Encounter: Payer: Self-pay | Admitting: Pharmacist

## 2023-11-11 ENCOUNTER — Other Ambulatory Visit: Payer: Self-pay | Admitting: Pharmacist

## 2023-11-11 MED ORDER — PRAVASTATIN SODIUM 40 MG PO TABS: 40.0000 mg | ORAL_TABLET | Freq: Every day | ORAL | 1 refills | Status: DC

## 2023-11-11 NOTE — Progress Notes (Signed)
 Pharmacy Quality Measure Review  This patient is appearing on a report for being at risk of failing the adherence measure for cholesterol (statin) and hypertension (ACEi/ARB) medications this calendar year.   Medication: telmisartan  80mg   Last fill date: 09/28/2023 for 90 day supply   Medication: pravastatin  40mg  Last fill date: 07/22/2023 for 90 day supply  No refills remiaing on pravastatin  Rx. Last appt with PCP was 10/03/2023. Next appt scheduled for 02/03/2024.  Sent in updated Rx for pravastatin     Madelin Ray, PharmD Clinical Pharmacist [facility] Primary [facility]  Population Health [phone]

## 2023-11-21 ENCOUNTER — Telehealth: Payer: Self-pay | Admitting: Neurology

## 2023-11-21 NOTE — Telephone Encounter (Signed)
 Pt. Cld and was xfrd to Dana for questions on Psyc testing, after speaking to her he would like to speak to the nurse about it

## 2023-11-21 NOTE — Telephone Encounter (Signed)
 Pt declined to have Psyc testing--stating what is the point if already know if memory is declining and cancel schedule.

## 2023-11-24 ENCOUNTER — Ambulatory Visit: Payer: Self-pay

## 2023-11-24 ENCOUNTER — Institutional Professional Consult (permissible substitution): Admitting: Psychology

## 2023-12-02 ENCOUNTER — Telehealth: Payer: Self-pay | Admitting: Pharmacist

## 2023-12-02 NOTE — Progress Notes (Signed)
 Pharmacy Quality Measure Review  This patient is appearing on a report for being at risk of failing the adherence measure for cholesterol (statin) and hypertension (ACEi/ARB) medications this calendar year.   Medication: telmisartan  80mg   Last fill date: 09/28/2023 for 90 day supply   Medication: pravastatin  40mg  Last fill date: 07/22/2023 for 90 day supply  Sent in refill for pravastatin  11/11/2023 but does not appear to have been filled or maybe patient did not pick up. Last appt with PCP was 10/03/2023. Next appt scheduled for 02/03/2024.  Called [facility] to check on pravastatin  Rx. They were not sure why it was not filled but they will fill today.    Madelin Ray, PharmD Clinical Pharmacist [facility] Primary Care  Population Health [phone]

## 2023-12-09 ENCOUNTER — Encounter: Admitting: Psychology

## 2023-12-17 ENCOUNTER — Other Ambulatory Visit: Payer: Self-pay | Admitting: Internal Medicine

## 2024-01-03 ENCOUNTER — Other Ambulatory Visit: Payer: Self-pay | Admitting: Neurology

## 2024-01-05 ENCOUNTER — Encounter: Payer: Self-pay | Admitting: Neurology

## 2024-01-05 ENCOUNTER — Encounter: Payer: Self-pay | Admitting: Pharmacist

## 2024-01-05 NOTE — Progress Notes (Signed)
 Pharmacy Quality Measure Review  This patient is appearing on a report for being at risk of failing the adherence measure for hypertension (ACEi/ARB) medications this calendar year.   Medication: telmisartan  80mg   Last fill date: 09/28/2023 for 90 day supply   Reviewed recent refill history in Dr Annemarie database. Actual last refill date was 12/14/2023 for 90 day supply. Patient has 3 refills remaining. Next appointment with PCP is 03/20/2024.    No additional intervention needed.   Madelin Ray, PharmD Clinical Pharmacist [facility] Primary [facility]  Population Health [phone]

## 2024-02-02 ENCOUNTER — Other Ambulatory Visit: Payer: Self-pay | Admitting: Internal Medicine

## 2024-02-03 ENCOUNTER — Ambulatory Visit: Admitting: Internal Medicine

## 2024-02-14 ENCOUNTER — Other Ambulatory Visit (HOSPITAL_COMMUNITY): Payer: Self-pay | Admitting: Internal Medicine

## 2024-03-07 ENCOUNTER — Ambulatory Visit

## 2024-03-09 ENCOUNTER — Telehealth: Payer: Self-pay | Admitting: Internal Medicine

## 2024-03-09 NOTE — Telephone Encounter (Signed)
 Spoke with pt. Pt states he became dizzy 2 days ago, BP 130/90 HR 116. Today 134/89 HR 120. Took 2 xanax  and states he is feeling better and states it helps to lower HR into the 80's. Pt denies other symptoms. Asked if he has also reached out to Afib clinic and he stated he did not. Pt became upset when I advised I would get a message to Afib clinic and stated he wanted Dr Mona to address. Advised pt I would forward to Dr Mona to review and we would call back with recommendations. 911/ED precautions reviewed.

## 2024-03-09 NOTE — Telephone Encounter (Addendum)
 Discussed with Triage RN - Afib clinic happy to see patient if Dr Mona recommends. Will await response.

## 2024-03-09 NOTE — Telephone Encounter (Signed)
 Pt c/o BP issue: STAT if pt c/o blurred vision, one-sided weakness or slurred speech.  STAT if BP is GREATER than 180/120 TODAY.  STAT if BP is LESS than 90/60 and SYMPTOMATIC TODAY  1. What is your BP concern? Irregular BP   2. Have you taken any BP medication today? Yes   3. What are your last 5 BP readings?  Today 134/89 BP 116 HR Yesterday 130/90 BP 120 HR   4. Are you having any other symptoms (ex. Dizziness, headache, blurred vision, passed out)? Dizziness, lightheaded. Felt like he may pass out about two days ago

## 2024-03-12 NOTE — Telephone Encounter (Signed)
 Pt daughter called in with the pt on the line. Pt calling back with what he should do. Hr is still in 80's.

## 2024-03-12 NOTE — Telephone Encounter (Signed)
 Spoke with pt regarding Dr. Katharina suggestions. Pt agreeable to plan and will have afib clinic appointment tomorrow. Pt verbalized understanding. All questions if any were answered.

## 2024-03-12 NOTE — Telephone Encounter (Signed)
 Pt calling back regarding last phone call. Expresses concern about mitral valve leakage and questions why his HR is sometimes high, sometimes low. Unsure of HR during prior dizziness episodes. Informed patient that it could be related to his AF. Number given for AF clinic and informed patient to follow up with them.   Patient requests mitral valve review and declines Afib clinic, stating symptoms are unrelated to Afib. Advised concerns will be forwarded to Dr. Mona and strongly encouraged to follow up with Afib clinic. ED precautions reviewed, patient verbalized understanding.

## 2024-03-13 ENCOUNTER — Ambulatory Visit (HOSPITAL_COMMUNITY)
Admission: RE | Admit: 2024-03-13 | Discharge: 2024-03-13 | Disposition: A | Discharge status: Home / Self Care | Source: Ambulatory Visit | Attending: Internal Medicine | Admitting: Internal Medicine

## 2024-03-13 ENCOUNTER — Encounter (HOSPITAL_COMMUNITY): Payer: Self-pay | Admitting: Nurse Practitioner

## 2024-03-13 VITALS — BP 128/82 | HR 80 | Ht 70.0 in | Wt 175.6 lb

## 2024-03-13 DIAGNOSIS — I4819 Other persistent atrial fibrillation: Secondary | ICD-10-CM | POA: Diagnosis not present

## 2024-03-13 DIAGNOSIS — I480 Paroxysmal atrial fibrillation: Secondary | ICD-10-CM | POA: Diagnosis not present

## 2024-03-13 DIAGNOSIS — I4891 Unspecified atrial fibrillation: Secondary | ICD-10-CM | POA: Insufficient documentation

## 2024-03-13 DIAGNOSIS — I10 Essential (primary) hypertension: Secondary | ICD-10-CM | POA: Diagnosis not present

## 2024-03-13 DIAGNOSIS — D6869 Other thrombophilia: Secondary | ICD-10-CM | POA: Diagnosis not present

## 2024-03-13 MED ORDER — METOPROLOL SUCCINATE ER 50 MG PO TB24: ORAL_TABLET | ORAL | 3 refills | Status: AC

## 2024-03-13 NOTE — H&P (View-Only) (Signed)
 "  Primary Care Physician: Amon Aloysius BRAVO, MD Primary Cardiologist: Vinie JAYSON Maxcy, MD Electrophysiologist: Danelle Birmingham, MD  Referring Physician: Vinie JAYSON Maxcy, MD  Kevin Bass is a 84 y.o. male with a history of paroxysmal AF, HTN, HLD, cryptogenic CVA s/p loop recorder 2018, mitral valve disease s/p MVR 2006, nonobstructive CAD, colon CA, who presents for follow up in the [facility].    Kevin Bass was initially diagnosed with atrial fibrillation after cryptogenic CVA with loop recorder and was started on Eliquis  and treated with metoprolol .  He was last seen by Norleen Heinrich, PA on 09/09/2023 after being seen in the ED for tachycardia and confusion and underwent MRI that showed no acute changes.  During his visit he was in sinus rhythm but noted feeling palpitations and endorsed no missed doses of Eliquis .  He placed a 2-week monitor to assess AF burden with plan to pursue antiarrhythmic therapy if indicated.  ZIO monitor came back showing no evidence of A-fib with plan to follow-up in March 2026.  He contacted heart care on 03/09/2024 with complaint of dizziness x 2 days and elevated heart rate of 116.  He took 2 Xanax  and reported that he started feeling better with heart rate lowering into the 80s.  Message was forwarded to Dr. Maxcy who recommended patient follow-up in the A-fib clinic for possible monitor to evaluate AF burden.  Kevin Bass presents today with his daughter for A-fib follow-up.He experienced an episode of dizziness and a fall approximately one week ago. During the fall, he did not hit his head but was unable to stand on his right leg and required assistance from his daughter to get up. He also experienced a fainting episode during this time. Emergency services were called, but it was determined not to be a stroke. Initially, he had difficulty walking but has since regained the ability to walk normally. His heart rate has been fluctuating, reaching  up to the 120s over the past week, but has stabilized to the 80s in the last few days. He uses an Apple Watch to monitor his heart rate, which alerted him during the dizzy spell. He does not feel different when in atrial fibrillation unless his heart rate increases significantly.He has a history of mitral valve disease and underwent valve replacement surgery approximately 20 years ago. He had an echocardiogram in 2024, which showed mild valve leakage. No shortness of breath, chest discomfort, or difficulty lying flat. He has not missed any doses of Eliquis . He also takes amlodipine , which he associates with swelling in his ankles. He denies taking any over-the-counter medications alcohol use or cold medicine recently. He has not been very active lately but does engage in daily walks and gardening.we discussed treatment for restoring sinus rhythm consisting of rhythm control and DCCV.  He has elected to pursue DCCV and if not able to maintain sinus rhythm post cardioversion will discuss rhythm control at that time.   Today, he denies symptoms of palpitations, chest pain, shortness of breath, orthopnea, PND, lower extremity edema, dizziness, presyncope, syncope, snoring, daytime somnolence, bleeding, or neurologic sequela. The patient is tolerating medications without difficulties and is otherwise without complaint today.   Discussed the use of AI scribe software for clinical note transcription with the patient, who gave verbal consent to proceed.   Notes: Echo completed in 2024 showed stable MVR - Antiarrhythmic therapy, Tikosyn, Multaq, or amiodarone  Atrial Fibrillation Management history: History of Sleep Apnea Previous antiarrhythmic drugs:  Previous  cardioversions:  Previous ablations:  Anticoagulation history: Eliquis   ROS- All systems are reviewed and negative except as per the HPI above.  Past Medical History:  Diagnosis Date   Allergy    Anxiety    Arthritis    hands   Atrial  fibrillation (HCC)    Colon cancer (HCC)    s/p partial colectomy 1990, Cscope neg 7-09, next 2014   DISORDER, MITRAL VALVE    MV recontruction at [facility] 2006----still needs ABX for SBE prophylaxis    Elevated PSA    prostate nodule, Dr Renda   GERD (gastroesophageal reflux disease)    Heart murmur    no problems since surgery   HYPERGLYCEMIA, BORDERLINE 10/16/2008   HYPERLIPIDEMIA 06/15/2006   diet controlled   Hypertension    HYPERTROPHY PROSTATE W/O UR OBST & OTH LUTS 10/16/2008   Neuropathy    Right groin pain    Dr. Tanda   SCC (squamous cell carcinoma) 06/2017   s/p Mohs---Dr Judith   Stroke [facility])    Wears partial dentures    lower partial   Past Surgical History:  Procedure Laterality Date   COLECTOMY  1990   HERNIA REPAIR Left 2014   [facility] Ms rep w/mesh- March 2014   LOOP RECORDER INSERTION N/A 12/01/2016   Procedure: LOOP RECORDER INSERTION;  Surgeon: Waddell Danelle ORN, MD;  Location: [facility] INVASIVE CV LAB;  Service: Cardiovascular;  Laterality: N/A;   MITRAL VALVE ANNULOPLASTY     [facility] 2006   TEE WITHOUT CARDIOVERSION N/A 10/05/2016   Procedure: TRANSESOPHAGEAL ECHOCARDIOGRAM (TEE);  Surgeon: Mona Vinie BROCKS, MD;  Location: [facility] ENDOSCOPY;  Service: Cardiovascular;  Laterality: N/A;   Prednisolone acetate, Rosuvastatin , and Statins Current Outpatient Medications  Medication Sig Dispense Refill   acetaminophen  (TYLENOL ) 650 MG CR tablet Take 1,300 mg by mouth every 8 (eight) hours as needed for pain.     ALPRAZolam  (XANAX ) 0.25 MG tablet Take 1 tablet (0.25 mg total) by mouth 2 (two) times daily as needed for anxiety. 60 tablet 2   amLODipine  (NORVASC ) 5 MG tablet TAKE 1 TABLET BY MOUTH EVERY MORNING AND TAKE 1/2 TABLET BY MOUTH EVERY EVENING. 135 tablet 3   Arginine  500 MG CAPS Take 1,000 mg by mouth daily.      Baclofen  5 MG TABS Take 1 tablet (5 mg total) by mouth 3 (three) times daily. 270 tablet 3   Coenzyme Q10 100 MG TABS Take 1 tablet by mouth in the morning and at  bedtime.     ELIQUIS  5 MG TABS tablet TAKE 1 TABLET BY MOUTH TWICE DAILY 180 tablet 1   gabapentin  (NEURONTIN ) 300 MG capsule Take 2 capsules in AM and 3 capsules at night 450 capsule 3   Glucosamine-MSM-Hyaluronic Acd (JOINT HEALTH PO) Take 1 capsule by mouth daily.      hydrocortisone  (ANUSOL -HC) 25 MG suppository Place 1 suppository (25 mg total) rectally 2 (two) times daily as needed for hemorrhoids. 12 suppository 1   loratadine  (CLARITIN ) 10 MG tablet Take 10 mg by mouth at bedtime.     Multiple Vitamins-Minerals (OCUVITE PRESERVISION PO) Take 1 capsule by mouth 2 (two) times daily.      nitroGLYCERIN  (NITROSTAT ) 0.4 MG SL tablet Place 1 tablet (0.4 mg total) under the tongue every 5 (five) minutes x 3 doses as needed for chest pain. 25 tablet 3   NON FORMULARY Take 1-2 packets by mouth See admin instructions. Lypo-Spheric vitamin C gel packets- Mix 1-2 packets of gel into juice one to  two times a day and drink     NON FORMULARY Take 10 mg by mouth See admin instructions. [employer] Lyco-Sorb (for Prostate & Cardiovascular Health) 10 mg capsules: Take 10 mg by mouth two times a day     Omega-3 Fatty Acids (FISH OIL PO) Take 1 capsule by mouth in the morning and at bedtime.     omeprazole  (PRILOSEC) 20 MG capsule Take 1 capsule (20 mg total) by mouth daily before breakfast. 90 capsule 1   OXcarbazepine  (TRILEPTAL ) 150 MG tablet TAKE 1/2 TABLET BY MOUTH IN THE MORNING THEN TAKE 1 TABLET BY MOUTH IN THE EVENING 135 tablet 3   pravastatin  (PRAVACHOL ) 40 MG tablet Take 1 tablet (40 mg total) by mouth at bedtime. 90 tablet 1   telmisartan  (MICARDIS ) 80 MG tablet Take 1 tablet (80 mg total) by mouth daily. 90 tablet 3   Vitamin D, Cholecalciferol, 25 MCG (1000 UT) TABS Take 1 tablet by mouth daily.     metoprolol  succinate (TOPROL -XL) 50 MG 24 hr tablet Take 1 tablet (50 mg total) by mouth 2 (two) times daily. May also take 0.5 tablets (25 mg total) daily as needed (may take 25-50 mg daily as  needed for HR above 110). 75 tablet 3   No current facility-administered medications for this encounter.    Physical Exam: BP 128/82   Pulse 80   Ht 5' 10 (1.778 m)   Wt 79.7 kg   BMI 25.20 kg/m   GEN: Well nourished, well developed in no acute distress NECK: No JVD; No carotid bruits CARDIAC: Irregularly irregular rate and rhythm, no murmurs, rubs, gallops RESPIRATORY:  Clear to auscultation without rales, wheezing or rhonchi  ABDOMEN: Soft, non-tender, non-distended EXTREMITIES:  No edema; No deformity   Wt Readings from Last 3 Encounters:  03/13/24 79.7 kg  11/03/23 79.4 kg  10/21/23 80.3 kg    Lab Results  Component Value Date   TSH 2.15 12/01/2022   EKG today demonstrates:   EKG Interpretation Date/Time:  Tuesday March 13 2024 08:48:13 EST Ventricular Rate:  80 PR Interval:    QRS Duration:  88 QT Interval:  376 QTC Calculation: 433 R Axis:   91  Text Interpretation: Atrial fibrillation Rightward axis Abnormal ECG When compared with ECG of 13-Oct-2023 23:02, PREVIOUS ECG IS PRESENT Confirmed by Wyn Manus [phone]) on 03/13/2024 8:50:02 AM        Echo Completed 12/29/2022: 1. Left ventricular ejection fraction, by estimation, is 50 to 55%. The  left ventricle has low normal function. The left ventricle has no regional  wall motion abnormalities. Left ventricular diastolic parameters are  indeterminate.   2. Right ventricular systolic function is normal. The right ventricular  size is normal.   3. Left atrial size was mildly dilated.   4. The mitral valve has been repaired/replaced. Trivial mitral valve  regurgitation. No evidence of mitral stenosis. The mean mitral valve  gradient is 3.0 mmHg. There is a prosthetic annuloplasty ring present in  the mitral position.   5. The aortic valve is tricuspid. Aortic valve regurgitation is not  visualized. No aortic stenosis is present.   6. The inferior vena cava is normal in size with greater than 50%   respiratory variability, suggesting right atrial pressure of 3 mmHg.   CHA2DS2-VASc Score = 6  The patient's score is based upon: CHF History: 0 HTN History: 1 Diabetes History: 0 Stroke History: 2 Vascular Disease History: 1 Age Score: 2 Gender Score: 0  ASSESSMENT AND PLAN: Paroxysmal Atrial Fibrillation (ICD10:  I48.0) The patient's CHA2DS2-VASc score is 6, indicating a 9.7% annual risk of stroke.   -Currently in atrial fibrillation with controlled heart rate. Discussed rhythm control options: amiodarone, Tikosyn and Multaq.  -Cardioversion considered to restore sinus rhythm. Emphasized Apple Watch use for monitoring. - Ordered TEE and cardioversion to assess mitral valve function and restore sinus rhythm. - Ordered CBC and BMP for pre-procedure labs. - Adjusted metoprolol  to 50 mg twice daily, with additional 25-50 mg as needed for heart rate >110 bpm. - Continue Eliquis  as prescribed. - Encouraged wearing Apple Watch 24/7 to monitor AFib episodes. - Scheduled follow-up in two weeks post-cardioversion to evaluate rhythm and symptoms.  Secondary Hypercoagulable State (ICD10:  D68.69) The patient is at significant risk for stroke/thromboembolism based upon his CHA2DS2-VASc Score of 6.  Continue Apixaban  (Eliquis ).   VHD: -s/p MVR at [facility] in 2009 with most recent 2D echo showing stable prosthesis.  HTN: BP well controlled. Continue current antihypertensive regimen.   Signed,  Wyn Raddle, Jackee Shove, NP    03/13/2024 11:06 AM    Informed Consent   Shared Decision Making/Informed Consent   The risks [stroke, cardiac arrhythmias rarely resulting in the need for a temporary or permanent pacemaker, skin irritation or burns, esophageal damage, perforation (1:10,000 risk), bleeding, pharyngeal hematoma as well as other potential complications associated with conscious sedation including aspiration, arrhythmia, respiratory failure and death],  benefits (treatment guidance, restoration of normal sinus rhythm, diagnostic support) and alternatives of a transesophageal echocardiogram guided cardioversion were discussed in detail with Kevin Bass and he is willing to proceed.    Follow up with the AF Clinic in two weeks   "

## 2024-03-13 NOTE — Patient Instructions (Addendum)
 Take   metoprolol  succinate (TOPROL -XL) 50 MG twice a day and may take extra 25-50 mg once daily for HR above 110    Cardioversion scheduled for:  03/16/24 Friday at 10:30 am    - Arrive at the [facility] ([address]  and check in with ADMITTING at 10:30 am    - Do not eat or drink anything after midnight the night prior to your procedure.   - Take all your morning medication (except diabetic medications) with a sip of water prior to arrival.  - Do NOT miss any doses of your blood thinner - if you should miss a dose or take a dose more than 4 hours late -- please notify our office immediately.  - You will not be able to drive home after your procedure. Please ensure you have a responsible adult to drive you home. You will need someone with you for 24 hours post procedure.     - Expect to be in the procedural area approximately 2 hours.   - If you feel as if you go back into normal rhythm prior to scheduled cardioversion, please notify our office immediately.   If your procedure is canceled in the cardioversion suite you will be charged a cancellation fee.

## 2024-03-13 NOTE — Progress Notes (Signed)
 "  Primary Care Physician: Amon Aloysius BRAVO, MD Primary Cardiologist: Vinie JAYSON Maxcy, MD Electrophysiologist: Danelle Birmingham, MD  Referring Physician: Vinie JAYSON Maxcy, MD  Briyan Kleven is a 84 y.o. male with a history of paroxysmal AF, HTN, HLD, cryptogenic CVA s/p loop recorder 2018, mitral valve disease s/p MVR 2006, nonobstructive CAD, colon CA, who presents for follow up in the [facility].    Mr. Nannini was initially diagnosed with atrial fibrillation after cryptogenic CVA with loop recorder and was started on Eliquis  and treated with metoprolol .  He was last seen by Norleen Heinrich, PA on 09/09/2023 after being seen in the ED for tachycardia and confusion and underwent MRI that showed no acute changes.  During his visit he was in sinus rhythm but noted feeling palpitations and endorsed no missed doses of Eliquis .  He placed a 2-week monitor to assess AF burden with plan to pursue antiarrhythmic therapy if indicated.  ZIO monitor came back showing no evidence of A-fib with plan to follow-up in March 2026.  He contacted heart care on 03/09/2024 with complaint of dizziness x 2 days and elevated heart rate of 116.  He took 2 Xanax  and reported that he started feeling better with heart rate lowering into the 80s.  Message was forwarded to Dr. Maxcy who recommended patient follow-up in the A-fib clinic for possible monitor to evaluate AF burden.  Mr. Grell presents today with his daughter for A-fib follow-up.He experienced an episode of dizziness and a fall approximately one week ago. During the fall, he did not hit his head but was unable to stand on his right leg and required assistance from his daughter to get up. He also experienced a fainting episode during this time. Emergency services were called, but it was determined not to be a stroke. Initially, he had difficulty walking but has since regained the ability to walk normally. His heart rate has been fluctuating, reaching  up to the 120s over the past week, but has stabilized to the 80s in the last few days. He uses an Apple Watch to monitor his heart rate, which alerted him during the dizzy spell. He does not feel different when in atrial fibrillation unless his heart rate increases significantly.He has a history of mitral valve disease and underwent valve replacement surgery approximately 20 years ago. He had an echocardiogram in 2024, which showed mild valve leakage. No shortness of breath, chest discomfort, or difficulty lying flat. He has not missed any doses of Eliquis . He also takes amlodipine , which he associates with swelling in his ankles. He denies taking any over-the-counter medications alcohol use or cold medicine recently. He has not been very active lately but does engage in daily walks and gardening.we discussed treatment for restoring sinus rhythm consisting of rhythm control and DCCV.  He has elected to pursue DCCV and if not able to maintain sinus rhythm post cardioversion will discuss rhythm control at that time.   Today, he denies symptoms of palpitations, chest pain, shortness of breath, orthopnea, PND, lower extremity edema, dizziness, presyncope, syncope, snoring, daytime somnolence, bleeding, or neurologic sequela. The patient is tolerating medications without difficulties and is otherwise without complaint today.   Discussed the use of AI scribe software for clinical note transcription with the patient, who gave verbal consent to proceed.   Notes: Echo completed in 2024 showed stable MVR - Antiarrhythmic therapy, Tikosyn, Multaq, or amiodarone  Atrial Fibrillation Management history: History of Sleep Apnea Previous antiarrhythmic drugs:  Previous  cardioversions:  Previous ablations:  Anticoagulation history: Eliquis   ROS- All systems are reviewed and negative except as per the HPI above.  Past Medical History:  Diagnosis Date   Allergy    Anxiety    Arthritis    hands   Atrial  fibrillation (HCC)    Colon cancer (HCC)    s/p partial colectomy 1990, Cscope neg 7-09, next 2014   DISORDER, MITRAL VALVE    MV recontruction at [facility] 2006----still needs ABX for SBE prophylaxis    Elevated PSA    prostate nodule, Dr Renda   GERD (gastroesophageal reflux disease)    Heart murmur    no problems since surgery   HYPERGLYCEMIA, BORDERLINE 10/16/2008   HYPERLIPIDEMIA 06/15/2006   diet controlled   Hypertension    HYPERTROPHY PROSTATE W/O UR OBST & OTH LUTS 10/16/2008   Neuropathy    Right groin pain    Dr. Tanda   SCC (squamous cell carcinoma) 06/2017   s/p Mohs---Dr Judith   Stroke [facility])    Wears partial dentures    lower partial   Past Surgical History:  Procedure Laterality Date   COLECTOMY  1990   HERNIA REPAIR Left 2014   [facility] rep w/mesh- March 2014   LOOP RECORDER INSERTION N/A 12/01/2016   Procedure: LOOP RECORDER INSERTION;  Surgeon: Waddell Danelle ORN, MD;  Location: [facility] INVASIVE CV LAB;  Service: Cardiovascular;  Laterality: N/A;   MITRAL VALVE ANNULOPLASTY     [facility] 2006   TEE WITHOUT CARDIOVERSION N/A 10/05/2016   Procedure: TRANSESOPHAGEAL ECHOCARDIOGRAM (TEE);  Surgeon: Mona Vinie BROCKS, MD;  Location: [facility] ENDOSCOPY;  Service: Cardiovascular;  Laterality: N/A;   Prednisolone acetate, Rosuvastatin , and Statins Current Outpatient Medications  Medication Sig Dispense Refill   acetaminophen  (TYLENOL ) 650 MG CR tablet Take 1,300 mg by mouth every 8 (eight) hours as needed for pain.     ALPRAZolam  (XANAX ) 0.25 MG tablet Take 1 tablet (0.25 mg total) by mouth 2 (two) times daily as needed for anxiety. 60 tablet 2   amLODipine  (NORVASC ) 5 MG tablet TAKE 1 TABLET BY MOUTH EVERY MORNING AND TAKE 1/2 TABLET BY MOUTH EVERY EVENING. 135 tablet 3   Arginine  500 MG CAPS Take 1,000 mg by mouth daily.      Baclofen  5 MG TABS Take 1 tablet (5 mg total) by mouth 3 (three) times daily. 270 tablet 3   Coenzyme Q10 100 MG TABS Take 1 tablet by mouth in the morning and at  bedtime.     ELIQUIS  5 MG TABS tablet TAKE 1 TABLET BY MOUTH TWICE DAILY 180 tablet 1   gabapentin  (NEURONTIN ) 300 MG capsule Take 2 capsules in AM and 3 capsules at night 450 capsule 3   Glucosamine-MSM-Hyaluronic Acd (JOINT HEALTH PO) Take 1 capsule by mouth daily.      hydrocortisone  (ANUSOL -HC) 25 MG suppository Place 1 suppository (25 mg total) rectally 2 (two) times daily as needed for hemorrhoids. 12 suppository 1   loratadine  (CLARITIN ) 10 MG tablet Take 10 mg by mouth at bedtime.     Multiple Vitamins-Minerals (OCUVITE PRESERVISION PO) Take 1 capsule by mouth 2 (two) times daily.      nitroGLYCERIN  (NITROSTAT ) 0.4 MG SL tablet Place 1 tablet (0.4 mg total) under the tongue every 5 (five) minutes x 3 doses as needed for chest pain. 25 tablet 3   NON FORMULARY Take 1-2 packets by mouth See admin instructions. Lypo-Spheric vitamin C gel packets- Mix 1-2 packets of gel into juice one to  two times a day and drink     NON FORMULARY Take 10 mg by mouth See admin instructions. [employer] Lyco-Sorb (for Prostate & Cardiovascular Health) 10 mg capsules: Take 10 mg by mouth two times a day     Omega-3 Fatty Acids (FISH OIL PO) Take 1 capsule by mouth in the morning and at bedtime.     omeprazole  (PRILOSEC) 20 MG capsule Take 1 capsule (20 mg total) by mouth daily before breakfast. 90 capsule 1   OXcarbazepine  (TRILEPTAL ) 150 MG tablet TAKE 1/2 TABLET BY MOUTH IN THE MORNING THEN TAKE 1 TABLET BY MOUTH IN THE EVENING 135 tablet 3   pravastatin  (PRAVACHOL ) 40 MG tablet Take 1 tablet (40 mg total) by mouth at bedtime. 90 tablet 1   telmisartan  (MICARDIS ) 80 MG tablet Take 1 tablet (80 mg total) by mouth daily. 90 tablet 3   Vitamin D, Cholecalciferol, 25 MCG (1000 UT) TABS Take 1 tablet by mouth daily.     metoprolol  succinate (TOPROL -XL) 50 MG 24 hr tablet Take 1 tablet (50 mg total) by mouth 2 (two) times daily. May also take 0.5 tablets (25 mg total) daily as needed (may take 25-50 mg daily as  needed for HR above 110). 75 tablet 3   No current facility-administered medications for this encounter.    Physical Exam: BP 128/82   Pulse 80   Ht 5' 10 (1.778 m)   Wt 79.7 kg   BMI 25.20 kg/m   GEN: Well nourished, well developed in no acute distress NECK: No JVD; No carotid bruits CARDIAC: Irregularly irregular rate and rhythm, no murmurs, rubs, gallops RESPIRATORY:  Clear to auscultation without rales, wheezing or rhonchi  ABDOMEN: Soft, non-tender, non-distended EXTREMITIES:  No edema; No deformity   Wt Readings from Last 3 Encounters:  03/13/24 79.7 kg  11/03/23 79.4 kg  10/21/23 80.3 kg    Lab Results  Component Value Date   TSH 2.15 12/01/2022   EKG today demonstrates:   EKG Interpretation Date/Time:  Tuesday March 13 2024 08:48:13 EST Ventricular Rate:  80 PR Interval:    QRS Duration:  88 QT Interval:  376 QTC Calculation: 433 R Axis:   91  Text Interpretation: Atrial fibrillation Rightward axis Abnormal ECG When compared with ECG of 13-Oct-2023 23:02, PREVIOUS ECG IS PRESENT Confirmed by Wyn Manus [phone]) on 03/13/2024 8:50:02 AM        Echo Completed 12/29/2022: 1. Left ventricular ejection fraction, by estimation, is 50 to 55%. The  left ventricle has low normal function. The left ventricle has no regional  wall motion abnormalities. Left ventricular diastolic parameters are  indeterminate.   2. Right ventricular systolic function is normal. The right ventricular  size is normal.   3. Left atrial size was mildly dilated.   4. The mitral valve has been repaired/replaced. Trivial mitral valve  regurgitation. No evidence of mitral stenosis. The mean mitral valve  gradient is 3.0 mmHg. There is a prosthetic annuloplasty ring present in  the mitral position.   5. The aortic valve is tricuspid. Aortic valve regurgitation is not  visualized. No aortic stenosis is present.   6. The inferior vena cava is normal in size with greater than 50%   respiratory variability, suggesting right atrial pressure of 3 mmHg.   CHA2DS2-VASc Score = 6  The patient's score is based upon: CHF History: 0 HTN History: 1 Diabetes History: 0 Stroke History: 2 Vascular Disease History: 1 Age Score: 2 Gender Score: 0  ASSESSMENT AND PLAN: Paroxysmal Atrial Fibrillation (ICD10:  I48.0) The patient's CHA2DS2-VASc score is 6, indicating a 9.7% annual risk of stroke.   -Currently in atrial fibrillation with controlled heart rate. Discussed rhythm control options: amiodarone, Tikosyn and Multaq.  -Cardioversion considered to restore sinus rhythm. Emphasized Apple Watch use for monitoring. - Ordered TEE and cardioversion to assess mitral valve function and restore sinus rhythm. - Ordered CBC and BMP for pre-procedure labs. - Adjusted metoprolol  to 50 mg twice daily, with additional 25-50 mg as needed for heart rate >110 bpm. - Continue Eliquis  as prescribed. - Encouraged wearing Apple Watch 24/7 to monitor AFib episodes. - Scheduled follow-up in two weeks post-cardioversion to evaluate rhythm and symptoms.  Secondary Hypercoagulable State (ICD10:  D68.69) The patient is at significant risk for stroke/thromboembolism based upon his CHA2DS2-VASc Score of 6.  Continue Apixaban  (Eliquis ).   VHD: -s/p MVR at [facility] in 2009 with most recent 2D echo showing stable prosthesis.  HTN: BP well controlled. Continue current antihypertensive regimen.   Signed,  Wyn Raddle, Jackee Shove, NP    03/13/2024 11:06 AM    Informed Consent   Shared Decision Making/Informed Consent   The risks [stroke, cardiac arrhythmias rarely resulting in the need for a temporary or permanent pacemaker, skin irritation or burns, esophageal damage, perforation (1:10,000 risk), bleeding, pharyngeal hematoma as well as other potential complications associated with conscious sedation including aspiration, arrhythmia, respiratory failure and death],  benefits (treatment guidance, restoration of normal sinus rhythm, diagnostic support) and alternatives of a transesophageal echocardiogram guided cardioversion were discussed in detail with Mr. Carillo and he is willing to proceed.    Follow up with the AF Clinic in two weeks   "

## 2024-03-14 ENCOUNTER — Ambulatory Visit (HOSPITAL_COMMUNITY): Payer: Self-pay | Admitting: Nurse Practitioner

## 2024-03-14 ENCOUNTER — Ambulatory Visit (HOSPITAL_COMMUNITY): Admitting: Internal Medicine

## 2024-03-14 LAB — BASIC METABOLIC PANEL WITH GFR
BUN/Creatinine Ratio: 18 (ref 10–24)
BUN: 19 mg/dL (ref 8–27)
CO2: 26 mmol/L (ref 20–29)
Calcium: 9.5 mg/dL (ref 8.6–10.2)
Chloride: 98 mmol/L (ref 96–106)
Creatinine, Ser: 1.03 mg/dL (ref 0.76–1.27)
Glucose: 88 mg/dL (ref 70–99)
Potassium: 5.3 mmol/L — ABNORMAL HIGH (ref 3.5–5.2)
Sodium: 138 mmol/L (ref 134–144)
eGFR: 71 mL/min/1.73

## 2024-03-14 LAB — CBC
Hematocrit: 42.8 % (ref 37.5–51.0)
Hemoglobin: 13.6 g/dL (ref 13.0–17.7)
MCH: 28.2 pg (ref 26.6–33.0)
MCHC: 31.8 g/dL (ref 31.5–35.7)
MCV: 89 fL (ref 79–97)
Platelets: 346 x10E3/uL (ref 150–450)
RBC: 4.83 x10E6/uL (ref 4.14–5.80)
RDW: 12.4 % (ref 11.6–15.4)
WBC: 6.7 x10E3/uL (ref 3.4–10.8)

## 2024-03-16 ENCOUNTER — Ambulatory Visit (HOSPITAL_COMMUNITY): Admitting: Anesthesiology

## 2024-03-16 ENCOUNTER — Encounter (HOSPITAL_COMMUNITY)
Admission: RE | Disposition: A | Discharge status: Home / Self Care | Payer: Self-pay | Source: Home / Self Care | Attending: Cardiovascular Disease

## 2024-03-16 ENCOUNTER — Ambulatory Visit (HOSPITAL_COMMUNITY)
Admission: RE | Admit: 2024-03-16 | Discharge: 2024-03-16 | Disposition: A | Discharge status: Home / Self Care | Attending: Cardiovascular Disease | Admitting: Cardiovascular Disease

## 2024-03-16 ENCOUNTER — Other Ambulatory Visit: Payer: Self-pay

## 2024-03-16 ENCOUNTER — Ambulatory Visit (HOSPITAL_COMMUNITY)

## 2024-03-16 ENCOUNTER — Encounter (HOSPITAL_COMMUNITY): Payer: Self-pay | Admitting: Cardiovascular Disease

## 2024-03-16 DIAGNOSIS — I10 Essential (primary) hypertension: Secondary | ICD-10-CM | POA: Diagnosis not present

## 2024-03-16 DIAGNOSIS — Z952 Presence of prosthetic heart valve: Secondary | ICD-10-CM | POA: Insufficient documentation

## 2024-03-16 DIAGNOSIS — I2510 Atherosclerotic heart disease of native coronary artery without angina pectoris: Secondary | ICD-10-CM | POA: Diagnosis not present

## 2024-03-16 DIAGNOSIS — I4891 Unspecified atrial fibrillation: Secondary | ICD-10-CM | POA: Diagnosis not present

## 2024-03-16 DIAGNOSIS — I6389 Other cerebral infarction: Secondary | ICD-10-CM | POA: Diagnosis not present

## 2024-03-16 DIAGNOSIS — Z79899 Other long term (current) drug therapy: Secondary | ICD-10-CM | POA: Diagnosis not present

## 2024-03-16 DIAGNOSIS — I4819 Other persistent atrial fibrillation: Secondary | ICD-10-CM

## 2024-03-16 DIAGNOSIS — D6869 Other thrombophilia: Secondary | ICD-10-CM | POA: Insufficient documentation

## 2024-03-16 DIAGNOSIS — I361 Nonrheumatic tricuspid (valve) insufficiency: Secondary | ICD-10-CM

## 2024-03-16 DIAGNOSIS — Z87891 Personal history of nicotine dependence: Secondary | ICD-10-CM | POA: Diagnosis not present

## 2024-03-16 DIAGNOSIS — F418 Other specified anxiety disorders: Secondary | ICD-10-CM

## 2024-03-16 DIAGNOSIS — Z8673 Personal history of transient ischemic attack (TIA), and cerebral infarction without residual deficits: Secondary | ICD-10-CM | POA: Diagnosis not present

## 2024-03-16 DIAGNOSIS — G4730 Sleep apnea, unspecified: Secondary | ICD-10-CM | POA: Diagnosis not present

## 2024-03-16 DIAGNOSIS — Z7901 Long term (current) use of anticoagulants: Secondary | ICD-10-CM | POA: Insufficient documentation

## 2024-03-16 DIAGNOSIS — E785 Hyperlipidemia, unspecified: Secondary | ICD-10-CM | POA: Diagnosis not present

## 2024-03-16 DIAGNOSIS — Z85038 Personal history of other malignant neoplasm of large intestine: Secondary | ICD-10-CM | POA: Diagnosis not present

## 2024-03-16 LAB — ECHO TEE

## 2024-03-16 MED ORDER — PROPOFOL 10 MG/ML IV BOLUS
INTRAVENOUS | Status: DC | PRN
  Administered 2024-03-16: 30 mg via INTRAVENOUS

## 2024-03-16 MED ORDER — SODIUM CHLORIDE 0.9 % IV SOLN: INTRAVENOUS | Status: DC | PRN

## 2024-03-16 MED ORDER — SODIUM CHLORIDE 0.9 % IV SOLN: INTRAVENOUS | Status: DC

## 2024-03-16 MED ORDER — PROPOFOL 500 MG/50ML IV EMUL
INTRAVENOUS | Status: DC | PRN
  Administered 2024-03-16: 200 ug/kg/min via INTRAVENOUS

## 2024-03-16 NOTE — Op Note (Signed)
 Procedure: Electrical Cardioversion Indications:  Atrial Fibrillation  Procedure Details:  Consent: Risks of procedure as well as the alternatives and risks of each were explained to the (patient/caregiver).  Consent for procedure obtained.  Time Out: Verified patient identification, verified procedure, site/side was marked, verified correct patient position, special equipment/implants available, medications/allergies/relevent history reviewed, required imaging and test results available.  Performed  Patient placed on cardiac monitor, pulse oximetry, supplemental oxygen as necessary.  Sedation given: propofol  IV, Dr. Maryclare Pacer pads placed anterior and posterior chest.  Cardioverted 1 time(s).  Cardioversion with synchronized biphasic 200J shock.  Evaluation: Findings: Post procedure EKG shows: NSR Complications: None Patient did tolerate procedure well.  Time Spent Directly with the Patient:  15 minutes   Theresea Trautmann 03/16/2024, 11:23 AM

## 2024-03-16 NOTE — Interval H&P Note (Signed)
 History and Physical Interval Note:  03/16/2024 10:35 AM  Kevin Bass  has presented today for surgery, with the diagnosis of AFIB.  The various methods of treatment have been discussed with the patient and family. After consideration of risks, benefits and other options for treatment, the patient has consented to  Procedures: TRANSESOPHAGEAL ECHOCARDIOGRAM (N/A) CARDIOVERSION (N/A) as a surgical intervention.  The patient's history has been reviewed, patient examined, no change in status, stable for surgery.  I have reviewed the patient's chart and labs.  Questions were answered to the patient's satisfaction.     Jourdon Zimmerle

## 2024-03-16 NOTE — Anesthesia Postprocedure Evaluation (Signed)
"   Anesthesia Post Note  Patient: Kevin Bass  Procedure(s) Performed: TRANSESOPHAGEAL ECHOCARDIOGRAM CARDIOVERSION     Patient location during evaluation: Cath Lab Anesthesia Type: MAC Level of consciousness: awake and alert Pain management: pain level controlled Vital Signs Assessment: post-procedure vital signs reviewed and stable Respiratory status: spontaneous breathing, nonlabored ventilation, respiratory function stable and patient connected to nasal cannula oxygen Cardiovascular status: stable and blood pressure returned to baseline Postop Assessment: no apparent nausea or vomiting Anesthetic complications: no   There were no known notable events for this encounter.  Last Vitals:  Vitals:   03/16/24 1145 03/16/24 1150  BP: 111/61 (!) 112/59  Pulse: 67 64  Resp: 11 16  Temp:    SpO2: 96% 96%    Last Pain:  Vitals:   03/16/24 1120  PainSc: 0-No pain                 Katherin Ramey S      "

## 2024-03-16 NOTE — Anesthesia Preprocedure Evaluation (Signed)
"                                    Anesthesia Evaluation  Patient identified by MRN, date of birth, ID band Patient awake    Reviewed: Allergy & Precautions, H&P , NPO status , Patient's Chart, lab work & pertinent test results  Airway Mallampati: II   Neck ROM: full    Dental   Pulmonary former smoker   breath sounds clear to auscultation       Cardiovascular hypertension, + Valvular Problems/Murmurs  Rhythm:regular Rate:Normal  S/p MVr with annuloplasty   Neuro/Psych  PSYCHIATRIC DISORDERS Anxiety Depression    TIACVA    GI/Hepatic ,GERD  ,,  Endo/Other    Renal/GU      Musculoskeletal  (+) Arthritis ,    Abdominal   Peds  Hematology   Anesthesia Other Findings   Reproductive/Obstetrics                              Anesthesia Physical Anesthesia Plan  ASA: 3  Anesthesia Plan: MAC   Post-op Pain Management:    Induction: Intravenous  PONV Risk Score and Plan: 1 and Propofol infusion and Treatment may vary due to age or medical condition  Airway Management Planned: Nasal Cannula  Additional Equipment:   Intra-op Plan:   Post-operative Plan:   Informed Consent: I have reviewed the patients History and Physical, chart, labs and discussed the procedure including the risks, benefits and alternatives for the proposed anesthesia with the patient or authorized representative who has indicated his/her understanding and acceptance.     Dental advisory given  Plan Discussed with: CRNA, Anesthesiologist and Surgeon  Anesthesia Plan Comments:         Anesthesia Quick Evaluation  "

## 2024-03-16 NOTE — Transfer of Care (Signed)
 Immediate Anesthesia Transfer of Care Note  Patient: Kevin Bass  Procedure(s) Performed: TRANSESOPHAGEAL ECHOCARDIOGRAM CARDIOVERSION  Patient Location: Cath Lab  Anesthesia Type:MAC   Level of Consciousness: awake, alert , and oriented    Airway & Oxygen Therapy: Patient Spontanous Breathing  Post-op Assessment: Report given to RN and Post -op Vital signs reviewed and stable  Post vital signs: Reviewed and stable  Last Vitals:  Vitals Value Taken Time  BP 104/63 1125  Temp 36 1125  Pulse 70 1125  Resp 20 1125  SpO2 100 1125     Last Pain: There were no vitals filed for this visit.       Complications: There were no known notable events for this encounter.

## 2024-03-16 NOTE — Op Note (Signed)
 INDICATIONS: Mitral valve repair; persistent atrial fibrillation  PROCEDURE:   Informed consent was obtained prior to the procedure. The risks, benefits and alternatives for the procedure were discussed and the patient comprehended these risks.  Risks include, but are not limited to, cough, sore throat, vomiting, nausea, somnolence, esophageal and stomach trauma or perforation, bleeding, low blood pressure, aspiration, pneumonia, infection, trauma to the teeth and death.     During this procedure the patient was administered IV propofol  by Anesthesiology, Dr. Maryclare  The transesophageal probe was inserted in the esophagus and stomach without difficulty and multiple views were obtained.  The patient was kept under observation until the patient left the procedure room.  The patient left the procedure room in stable condition.   Agitated microbubble saline contrast was not administered.  COMPLICATIONS:    There were no immediate complications.  FINDINGS:  No LA thrombus. The appendage has been surgically amputated. Normal LV function. Excellent MV repair, with minimal regurgitation and no stenosis.   RECOMMENDATIONS:     Proceed with cardioversion.  Time Spent Directly with the Patient:  45 minutes   Jarom Govan 03/16/2024, 11:18 AM

## 2024-03-18 ENCOUNTER — Encounter (HOSPITAL_COMMUNITY): Payer: Self-pay | Admitting: Cardiovascular Disease

## 2024-03-20 ENCOUNTER — Ambulatory Visit: Admitting: *Deleted

## 2024-03-20 ENCOUNTER — Ambulatory Visit (HOSPITAL_COMMUNITY): Payer: Self-pay | Admitting: Nurse Practitioner

## 2024-03-20 ENCOUNTER — Ambulatory Visit: Admitting: Internal Medicine

## 2024-03-20 VITALS — BP 130/70 | HR 60 | Temp 97.4°F | Resp 16 | Ht 70.0 in | Wt 176.0 lb

## 2024-03-20 VITALS — BP 130/70 | HR 60 | Temp 97.4°F | Resp 16 | Ht 70.0 in | Wt 176.8 lb

## 2024-03-20 DIAGNOSIS — Z23 Encounter for immunization: Secondary | ICD-10-CM

## 2024-03-20 DIAGNOSIS — E782 Mixed hyperlipidemia: Secondary | ICD-10-CM

## 2024-03-20 DIAGNOSIS — I10 Essential (primary) hypertension: Secondary | ICD-10-CM

## 2024-03-20 DIAGNOSIS — I4821 Permanent atrial fibrillation: Secondary | ICD-10-CM | POA: Diagnosis not present

## 2024-03-20 DIAGNOSIS — R739 Hyperglycemia, unspecified: Secondary | ICD-10-CM | POA: Diagnosis not present

## 2024-03-20 DIAGNOSIS — G3184 Mild cognitive impairment, so stated: Secondary | ICD-10-CM | POA: Diagnosis not present

## 2024-03-20 DIAGNOSIS — Z0000 Encounter for general adult medical examination without abnormal findings: Secondary | ICD-10-CM

## 2024-03-20 MED ORDER — GABAPENTIN 300 MG PO CAPS: 300.0000 mg | ORAL_CAPSULE | Freq: Three times a day (TID) | ORAL | Status: AC

## 2024-03-20 MED ORDER — PRAVASTATIN SODIUM 40 MG PO TABS: 20.0000 mg | ORAL_TABLET | Freq: Every day | ORAL | Status: AC

## 2024-03-20 NOTE — Patient Instructions (Addendum)
 Kevin Bass,  Thank you for taking the time for your Medicare Wellness Visit. I appreciate your continued commitment to your health goals. Please review the care plan we discussed, and feel free to reach out if I can assist you further.  Please note that Annual Wellness Visits do not include a physical exam. Some assessments may be limited, especially if the visit was conducted virtually. If needed, we may recommend an in-person follow-up with your provider.  Ongoing Care Seeing your primary care provider every 3 to 6 months helps us  monitor your health and provide consistent, personalized care.   Dr Amon: today: today Medicare AWV: 03/22/25 1:40pm  Referrals If a referral was made during today's visit and you haven't received any updates within two weeks, please contact the referred provider directly to check on the status.  Dr Radionchenko: Please call to schedule your next eye exam, [phone]  Recommended Screenings:  Health Maintenance  Topic Date Due   COVID-19 Vaccine (4 - 2025-26 season) 10/24/2023   Medicare Annual Wellness Visit  12/30/2023   DTaP/Tdap/Td vaccine (4 - Td or Tdap) 12/14/2026   Pneumococcal Vaccine for age over 86  Completed   Flu Shot  Completed   Zoster (Shingles) Vaccine  Completed   Meningitis B Vaccine  Aged Out       03/20/2024    1:45 PM  Advanced Directives  Does Patient Have a Medical Advance Directive? Yes  Type of Estate Agent of [place];Living will  Does patient want to make changes to medical advance directive? No - Patient declined  Copy of Healthcare Power of Attorney in Chart? No - copy requested  Once completed and notarized, you may return a copy of your Advanced Directive(s) by either of the following:  Bring a copy of your health care power of attorney and living will to the office to be added to your chart at your convenience. You can mail a copy to [facility] [address] [place],  [place] [address] or email to [email or web address]   Vision: Annual vision screenings are recommended for early detection of glaucoma, cataracts, and diabetic retinopathy. These exams can also reveal signs of chronic conditions such as diabetes and high blood pressure.  Dental: Annual dental screenings help detect early signs of oral cancer, gum disease, and other conditions linked to overall health, including heart disease and diabetes.  Please see the attached documents for additional preventive care recommendations.

## 2024-03-20 NOTE — Progress Notes (Unsigned)
 "  Subjective:    Patient ID: Kevin Bass, male    DOB: [birth year], 84 y.o.   MRN: [id number]  DOS:  03/20/2024  ROV  Discussed the use of AI scribe software for clinical note transcription with the patient, who gave verbal consent to proceed.  History of Present Illness Kevin Bass is an 84 year old male with atrial fibrillation who presents for follow-up after cardioversion.  Atrial fibrillation and cardioversion - Underwent cardioversion for atrial fibrillation on March 16, 2024. - Prior to cardioversion, heart rate fluctuated from 60 to 120 beats per minute, predominantly in the late afternoon and evening. - Since cardioversion, heart rate has remained stable around 72 beats per minute, measured by Apple Watch. - No chest pain, dyspnea, or palpitations since cardioversion. - Denies leg swelling.  Medication management and adverse effects - Metoprolol  75 mg in the morning and 25 mg in the evening, adjusted post cardioversion. - Gabapentin  300 mg three times daily, reduced from four to five tablets per day due to memory concerns. - Ongoing memory difficulties attributed to gabapentin . - Pravastatin  at half of a 40 mg tablet for cholesterol by personal choice.  Functional status - Lives independently. - Manages own bills. - Continues to drive.  Gastrointestinal and other symptoms - No nausea, vomiting, or blood in stools.      Review of Systems See above   Past Medical History:  Diagnosis Date   Allergy    Anxiety    Arthritis    hands   Atrial fibrillation (HCC)    Colon cancer (HCC)    s/p partial colectomy 1990, Cscope neg 7-09, next 2014   DISORDER, MITRAL VALVE    MV recontruction at [facility] 2006----still needs ABX for SBE prophylaxis    Elevated PSA    prostate nodule, Dr Renda   GERD (gastroesophageal reflux disease)    Heart murmur    no problems since surgery   HYPERGLYCEMIA, BORDERLINE 10/16/2008   HYPERLIPIDEMIA 06/15/2006   diet  controlled   Hypertension    HYPERTROPHY PROSTATE W/O UR OBST & OTH LUTS 10/16/2008   Neuropathy    Right groin pain    Dr. Tanda   SCC (squamous cell carcinoma) 06/2017   s/p Mohs---Dr Judith   Stroke [facility])    Wears partial dentures    lower partial    Past Surgical History:  Procedure Laterality Date   CARDIOVERSION N/A 03/16/2024   Procedure: CARDIOVERSION;  Surgeon: Francyne Headland, MD;  Location: [facility] INVASIVE CV LAB;  Service: Cardiovascular;  Laterality: N/A;   COLECTOMY  1990   HERNIA REPAIR Left 2014   LIH rep w/mesh- March 2014   LOOP RECORDER INSERTION N/A 12/01/2016   Procedure: LOOP RECORDER INSERTION;  Surgeon: Waddell Danelle ORN, MD;  Location: [facility]VAS[facility] CV LAB;  Service: Cardiovascular;  Laterality: N/A;   MITRAL VALVE ANNULOPLASTY     [facility] 2006   TEE WITHOUT CARDIOVERSION N/A 10/05/2016   Procedure: TRANSESOPHAGEAL ECHOCARDIOGRAM (TEE);  Surgeon: Mona Vinie BROCKS, MD;  Location: [facility] ENDOSCOPY;  Service: Cardiovascular;  Laterality: N/A;   TRANSESOPHAGEAL ECHOCARDIOGRAM (CATH LAB) N/A 03/16/2024   Procedure: TRANSESOPHAGEAL ECHOCARDIOGRAM;  Surgeon: Francyne Headland, MD;  Location: [facility] CV [facility];  Service: Cardiovascular;  Laterality: N/A;    Current Outpatient Medications  Medication Instructions   acetaminophen  (TYLENOL ) 1,300 mg, Every 8 hours PRN   ALPRAZolam  (XANAX ) 0.25 mg, Oral, 2 times daily PRN   amLODipine  (NORVASC ) 5 MG tablet TAKE 1 TABLET BY MOUTH EVERY MORNING AND  TAKE 1/2 TABLET BY MOUTH EVERY EVENING.   Arginine  1,000 mg, Daily   Baclofen  5 mg, Oral, 3 times daily   Coenzyme Q10 100 mg, 2 times daily   Eliquis  5 mg, Oral, 2 times daily   gabapentin  (NEURONTIN ) 300 MG capsule Take 2 capsules in AM and 3 capsules at night   hydrocortisone  (ANUSOL -HC) 25 mg, Rectal, 2 times daily PRN   loratadine  (CLARITIN ) 10 mg, Daily at bedtime   metoprolol  succinate (TOPROL -XL) 50 MG 24 hr tablet Take 1 tablet (50 mg total) by mouth 2 (two) times daily. May also  take 0.5 tablets (25 mg total) daily as needed (may take 25-50 mg daily as needed for HR above 110).   Multiple Vitamins-Minerals (OCUVITE PRESERVISION PO) 1 capsule, 2 times daily   nitroGLYCERIN  (NITROSTAT ) 0.4 mg, Sublingual, Every 5 min x3 PRN   NON FORMULARY 1-2 packets, See admin instructions   NON FORMULARY 10 mg, See admin instructions   Omega-3 Fatty Acids (FISH OIL PO) 1,600 mg, 2 times daily   omeprazole  (PRILOSEC) 20 mg, Oral, Daily before breakfast   OVER THE COUNTER MEDICATION 1 tablet, 2 times daily   OVER THE COUNTER MEDICATION 1 tablet, 2 times daily   OXcarbazepine  (TRILEPTAL ) 150 MG tablet TAKE 1/2 TABLET BY MOUTH IN THE MORNING THEN TAKE 1 TABLET BY MOUTH IN THE EVENING   pravastatin  (PRAVACHOL ) 40 mg, Oral, Daily at bedtime   telmisartan  (MICARDIS ) 80 mg, Oral, Daily   Vitamin D (Cholecalciferol) 1,000 Units, Daily       Objective:   Physical Exam BP 130/70 (BP Location: Right Arm, Patient Position: Sitting, Cuff Size: Normal)   Pulse 60   Temp (!) 97.4 F (36.3 C) (Oral)   Resp 16   Ht 5' 10 (1.778 m)   Wt 176 lb (79.8 kg)   SpO2 99%   BMI 25.25 kg/m  General:   Well developed, NAD, BMI noted. HEENT:  Normocephalic . Face symmetric, atraumatic Lungs:  CTA B Normal respiratory effort, no intercostal retractions, no accessory muscle use. Heart: Irregularly irregular Lower extremities: no pretibial edema bilaterally  Skin: Not pale. Not jaundice Neurologic:  alert & has some difficulties with word finding  He is alert oriented x 3 Speech normal, gait appropriate for age and unassisted Psych--  Cognition and judgment appear intact.  Cooperative with normal attention span and concentration.  Behavior appropriate. No anxious or depressed appearing.      Assessment   Problem list Prediabetes HTN -Intolerant to low-dose clonidine ,high-dose amlodipine ,HCTZ dizziness Hyperlipidemia, (intolerant to simva-crestor  - cause pain) Anxiety- rarely takes  xanax  Neuro  -- Trigeminal neuralgia  dx 2015, no w/u, sx resurface x1 after temporary d/c of meds  --saw neuro again 02/2017, labs (-), had a NCS, Rx gaba CV: --Mitral valve annuloplasty 2006-- needs SBE prophylaxis -- SOB 04-2016, seen at [facility], ECHO Nl fx MV, cath: no CAD --Cryptogenic stroke 09/12/2016,  started plavix  -- new R PICA stroke,  11-30-16, implanted loop recorder placed --A-FIB noted on ILR ~ 12-21-2016, changed to eliquis   -- 11/2017:   L Hippocampus CVA with acute L posterior cerabral artery Elevated PSA and BPH-- Dr. Renda Colon cancer partial colectomy 1990, SCC . MOHS 06/2017  ===== Assessment and Plan Assessment & Plan Atrial fibrillation Chronic atrial fibrillation, recently beta-blockers dose adjusted and had a cardioversion 03/16/2024, he seems to be still on atrial fibrillation for now.  Tolerates anticoagulation well.  Recommend to see cardiology as scheduled. HTN: Blood pressure is well-controlled with  current regimen: Amlodipine , metoprolol , Micardis .  Recent potassium is slightly high recheck today. High cholesterol On pravastatin  40 mg, self decreased to 20 mg only due to aches and pains.  Intolerant to other statins.  Check FLP. MCI: Saw neurology 10/21/2023 due to ER visits with some HAs,  confusion, they noted MRI brain x 2 with no acute, they Rx a EEG and referred for a  neurocognitive testing.  They will try to reduce gabapentin  dose as he may account for confusion. So far the patient has declined neurocognitive testing, states he knows his memory is not as good as it was. Fortunately he lives independently, reports he manages all his affairs and still drives without problems. Trigeminal neuralgia: See above, reducing gabapentin  dose. Hyperglycemia: Mild, per chart review, check A1c. General health maintenance Had a flu shot, PNM done today.  Recommend COVID-vaccine.  Declined. RTC 4 months CPX      Atrial fibrillation: Cardioversion performed  03/16/2024.   8/11 PLAN: Atrial fibrillation : since the last visit, went to the ER with A-fib with RVR, metoprolol  dose increased, subsequently saw cardiology, felt to be stable, f/u monitor did not show A-fib. He is doing well, BP is 128/58 and heart rate 57 HTN: Currently on amlodipine , metoprolol , Micardis .  Last BMP okay. Headache: Went to the ER with a headache, workup including brain MRI negative for acute findings, currently asymptomatic Vaccine advice provided.  Flu, COVID and RSV. RTC 4 months   "

## 2024-03-20 NOTE — Progress Notes (Signed)
 "  Chief Complaint  Patient presents with   Medicare Wellness     Subjective:   Kevin Bass is a 84 y.o. male who presents for a Medicare Annual Wellness Visit.  Visit info / Clinical Intake: Medicare Wellness Visit Type:: Subsequent Annual Wellness Visit Persons participating in visit and providing information:: patient Medicare Wellness Visit Mode:: In-person (required for WTM) Interpreter Needed?: No Pre-visit prep was completed: yes AWV questionnaire completed by patient prior to visit?: no Living arrangements:: (!) lives alone Patient's Overall Health Status Rating: very good Typical amount of pain: some (neuropathy on right side of body) Does pain affect daily life?: no Are you currently prescribed opioids?: no  Dietary Habits and Nutritional Risks How many meals a day?: 2 (has light breakfast and 2 meals) Eats fruit and vegetables daily?: yes Most meals are obtained by: preparing own meals In the last 2 weeks, have you had any of the following?: none Diabetic:: no  Functional Status Activities of Daily Living (to include ambulation/medication): Independent Ambulation: Independent Medication Administration: Independent Home Management (perform basic housework or laundry): Independent Manage your own finances?: yes Primary transportation is: driving Concerns about vision?: no *vision screening is required for WTM* (due with Dr Radionchenko, will call to schedule appt. May be due for surgery on cataracts) Concerns about hearing?: no  Fall Screening Falls in the past year?: 0 Number of falls in past year: 0 Was there an injury with Fall?: 0 Fall Risk Category Calculator: 0 Patient Fall Risk Level: Low Fall Risk  Fall Risk Patient at Risk for Falls Due to: Orthopedic patient Fall risk Follow up: Falls evaluation completed  Home and Transportation Safety: All rugs have non-skid backing?: yes All stairs or steps have railings?: yes Grab bars in the bathtub  or shower?: yes Have non-skid surface in bathtub or shower?: yes Good home lighting?: yes Regular seat belt use?: yes Hospital stays in the last year:: (!) yes How many hospital stays:: 1 Reason: A-fib  Cognitive Assessment Difficulty concentrating, remembering, or making decisions? : yes (has history of strokes that have affected his short term memory) Will 6CIT or Mini Cog be Completed: no 6CIT or Mini Cog Declined: patient has a diagnosis of dementia or cognitive impairment (has history of stroke with impaired short term memory)  [employer] (For Healthcare) Does Patient Have a Medical Advance Directive?: Yes Does patient want to make changes to medical advance directive?: No - Patient declined Type of Advance Directive: Healthcare Power of [place]; Living will Copy of Healthcare Power of Attorney in Chart?: No - copy requested Copy of Living Will in Chart?: No - copy requested Would patient like information on creating a medical advance directive?: No - Patient declined  Reviewed/Updated  Reviewed/Updated: Reviewed All (Medical, Surgical, Family, Medications, Allergies, Care Teams, Patient Goals)    Allergies (verified) Prednisolone acetate, Rosuvastatin , and Statins   Current Medications (verified) Outpatient Encounter Medications as of 03/20/2024  Medication Sig   acetaminophen  (TYLENOL ) 650 MG CR tablet Take 1,300 mg by mouth every 8 (eight) hours as needed for pain.   ALPRAZolam  (XANAX ) 0.25 MG tablet Take 1 tablet (0.25 mg total) by mouth 2 (two) times daily as needed for anxiety.   amLODipine  (NORVASC ) 5 MG tablet TAKE 1 TABLET BY MOUTH EVERY MORNING AND TAKE 1/2 TABLET BY MOUTH EVERY EVENING.   Arginine  500 MG CAPS Take 1,000 mg by mouth daily.    Baclofen  5 MG TABS Take 1 tablet (5 mg total) by mouth 3 (three) times  daily. (Patient taking differently: Take 5-10 mg by mouth See admin instructions. Tale 5 mg in the morning and 10 mg in the evening)   Coenzyme Q10  100 MG TABS Take 100 mg by mouth in the morning and at bedtime.   ELIQUIS  5 MG TABS tablet TAKE 1 TABLET BY MOUTH TWICE DAILY   gabapentin  (NEURONTIN ) 300 MG capsule Take 2 capsules in AM and 3 capsules at night (Patient taking differently: Take 300-600 mg by mouth See admin instructions. Take 300 mg and 606 mg in the eveing)   hydrocortisone  (ANUSOL -HC) 25 MG suppository Place 1 suppository (25 mg total) rectally 2 (two) times daily as needed for hemorrhoids.   loratadine  (CLARITIN ) 10 MG tablet Take 10 mg by mouth at bedtime.   metoprolol  succinate (TOPROL -XL) 50 MG 24 hr tablet Take 1 tablet (50 mg total) by mouth 2 (two) times daily. May also take 0.5 tablets (25 mg total) daily as needed (may take 25-50 mg daily as needed for HR above 110).   Multiple Vitamins-Minerals (OCUVITE PRESERVISION PO) Take 1 capsule by mouth 2 (two) times daily.    nitroGLYCERIN  (NITROSTAT ) 0.4 MG SL tablet Place 1 tablet (0.4 mg total) under the tongue every 5 (five) minutes x 3 doses as needed for chest pain.   NON FORMULARY Take 1-2 packets by mouth See admin instructions. Lypo-Spheric vitamin C gel packets- Mix 1-2 packets of gel into juice one to two times a day and drink   NON FORMULARY Take 10 mg by mouth See admin instructions. [employer] Lyco-Sorb (for Prostate & Cardiovascular Health) 10 mg capsules: Take 10 mg by mouth two times a day   Omega-3 Fatty Acids (FISH OIL PO) Take 1,600 mg by mouth in the morning and at bedtime.   omeprazole  (PRILOSEC) 20 MG capsule Take 1 capsule (20 mg total) by mouth daily before breakfast.   OVER THE COUNTER MEDICATION Take 1 tablet by mouth 2 (two) times daily. Information Retention   OVER THE COUNTER MEDICATION Take 1 tablet by mouth 2 (two) times daily. Prosta Strong   OXcarbazepine  (TRILEPTAL ) 150 MG tablet TAKE 1/2 TABLET BY MOUTH IN THE MORNING THEN TAKE 1 TABLET BY MOUTH IN THE EVENING   pravastatin  (PRAVACHOL ) 40 MG tablet Take 1 tablet (40 mg total) by mouth at  bedtime. (Patient taking differently: Take 20 mg by mouth at bedtime.)   telmisartan  (MICARDIS ) 80 MG tablet Take 1 tablet (80 mg total) by mouth daily.   Vitamin D, Cholecalciferol, 25 MCG (1000 UT) TABS Take 1,000 Units by mouth daily.   No facility-administered encounter medications on file as of 03/20/2024.    History: Past Medical History:  Diagnosis Date   Allergy    Anxiety    Arthritis    hands   Atrial fibrillation (HCC)    Colon cancer (HCC)    s/p partial colectomy 1990, Cscope neg 7-09, next 2014   DISORDER, MITRAL VALVE    MV recontruction at [facility] 2006----still needs ABX for SBE prophylaxis    Elevated PSA    prostate nodule, Dr Renda   GERD (gastroesophageal reflux disease)    Heart murmur    no problems since surgery   HYPERGLYCEMIA, BORDERLINE 10/16/2008   HYPERLIPIDEMIA 06/15/2006   diet controlled   Hypertension    HYPERTROPHY PROSTATE W/O UR OBST & OTH LUTS 10/16/2008   Neuropathy    Right groin pain    Dr. Tanda   SCC (squamous cell carcinoma) 06/2017   s/p Mohs---Dr Judith  Stroke [facility])    Wears partial dentures    lower partial   Past Surgical History:  Procedure Laterality Date   CARDIOVERSION N/A 03/16/2024   Procedure: CARDIOVERSION;  Surgeon: Francyne Headland, MD;  Location: [facility] INVAS[facility] CV LAB;  Service: Cardiovascular;  Laterality: N/A;   COLECTOMY  1990   HERNIA REPAIR Left 2014   LIH rep w/mesh- March 2014   LOOP RECORDER INSERTION N/A 12/01/2016   Procedure: LOOP RECORDER INSERTION;  Surgeon: Waddell Danelle ORN, MD;  Location: [facility]VAS[facility] CV LAB;  Service: Cardiovascular;  Laterality: N/A;   MITRAL VALVE ANNULOPLASTY     [facility] 2006   TEE WITHOUT CARDIOVERSION N/A 10/05/2016   Procedure: TRANSESOPHAGEAL ECHOCARDIOGRAM (TEE);  Surgeon: Mona Vinie BROCKS, MD;  Location: [facility] ENDOSCOPY;  Service: Cardiovascular;  Laterality: N/A;   TRANSESOPHAGEAL ECHOCARDIOGRAM (CATH LAB) N/A 03/16/2024   Procedure: TRANSESOPHAGEAL ECHOCARDIOGRAM;  Surgeon: Francyne Headland, MD;  Location: [facility] INVAS[facility] CV LAB;  Service: Cardiovascular;  Laterality: N/A;   Family History  Problem Relation Age of Onset   Colon cancer Other        M side (cousins)   Diabetes Neg Hx    Stroke Neg Hx    CAD Neg Hx    Prostate cancer Neg Hx    Rectal cancer Neg Hx    Esophageal cancer Neg Hx    Social History   Occupational History   Occupation: retired   Tobacco Use   Smoking status: Former    Current packs/day: 0.00    Average packs/day: 0.5 packs/day for 20.0 years (10.0 ttl pk-yrs)    Types: Cigarettes    Start date: 02/23/1963    Quit date: 02/23/1983    Years since quitting: 41.0   Smokeless tobacco: Never   Tobacco comments:    Former smoker 09/09/23  Vaping Use   Vaping status: Never Used  Substance and Sexual Activity   Alcohol use: Yes    Alcohol/week: 4.0 standard drinks of alcohol    Types: 4 Glasses of wine per week   Drug use: No   Sexual activity: Not Currently   Tobacco Counseling Counseling given: Not Answered Tobacco comments: Former smoker 09/09/23  SDOH Screenings   Food Insecurity: No Food Insecurity (03/20/2024)  Housing: Low Risk (03/20/2024)  Transportation Needs: No Transportation Needs (03/20/2024)  Utilities: Not At Risk (03/20/2024)  Alcohol Screen: Low Risk (12/30/2022)  Depression (PHQ2-9): Low Risk (03/20/2024)  Financial Resource Strain: Low Risk (11/29/2022)  Physical Activity: Sufficiently Active (03/20/2024)  Social Connections: Socially Isolated (03/20/2024)  Stress: No Stress Concern Present (03/20/2024)  Tobacco Use: Medium Risk (03/20/2024)  Health Literacy: Adequate Health Literacy (12/30/2022)   See flowsheets for full screening details  Depression Screen PHQ 2 & 9 Depression Scale- Over the past 2 weeks, how often have you been bothered by any of the following problems? Little interest or pleasure in doing things: 0 (has lost physical ability to do activities he really enjoys) Feeling down, depressed, or hopeless (PHQ  Adolescent also includes...irritable): 0 (Keeps body / mind occupied) PHQ-2 Total Score: 0 Trouble falling or staying asleep, or sleeping too much: 3 (doesn't fall asleep until 1am. Has always gong to bed late) Feeling tired or having little energy: 0 Poor appetite or overeating (PHQ Adolescent also includes...weight loss): 0 Feeling bad about yourself - or that you are a failure or have let yourself or your family down: 0 Trouble concentrating on things, such as reading the newspaper or watching television (PHQ Adolescent also includes...like school  work): 0 Moving or speaking so slowly that other people could have noticed. Or the opposite - being so fidgety or restless that you have been moving around a lot more than usual: 0 Thoughts that you would be better off dead, or of hurting yourself in some way: 0 PHQ-9 Total Score: 3 If you checked off any problems, how difficult have these problems made it for you to do your work, take care of things at home, or get along with other people?: Not difficult at all  Depression Treatment Depression Interventions/Treatment : EYV7-0 Score <4 Follow-up Not Indicated     Goals Addressed   None          Objective:    Today's Vitals   03/20/24 1342 03/20/24 1411  BP: (!) 142/64 130/70  Pulse: 60   Resp: 16   Temp: (!) 97.4 F (36.3 C)   TempSrc: Oral   SpO2: 99%   Weight: 176 lb 12.8 oz (80.2 kg)   Height: 5' 10 (1.778 m)    Body mass index is 25.37 kg/m.  Hearing/Vision screen No results found. Immunizations and Health Maintenance Health Maintenance  Topic Date Due   COVID-19 Vaccine (4 - 2025-26 season) 10/24/2023   Medicare Annual Wellness (AWV)  03/20/2025   DTaP/Tdap/Td (4 - Td or Tdap) 12/14/2026   Pneumococcal Vaccine: 50+ Years  Completed   Influenza Vaccine  Completed   Zoster Vaccines- Shingrix  Completed   Meningococcal B Vaccine  Aged Out        Assessment/Plan:  This is a routine wellness examination for  Nomar.  Patient Care Team: Amon Aloysius BRAVO, MD as PCP - General (Internal Medicine) Mona Vinie BROCKS, MD as PCP - Cardiology (Cardiology) Waddell Danelle ORN, MD as PCP - Electrophysiology (Cardiology) Renda Glance, MD as Consulting Physician (Urology) Alphonzo Pac, MD as Referring Physician (Cardiology) Radionchenko, [place] GAILS, MD as Consulting Physician (Ophthalmology) Georjean Darice HERO, MD as Consulting Physician (Neurology) Carla Milling, RPH-CPP (Pharmacist)  I have personally reviewed and noted the following in the patients chart:   Medical and social history Use of alcohol, tobacco or illicit drugs  Current medications and supplements including opioid prescriptions. Functional ability and status Nutritional status Physical activity Advanced directives List of other physicians Hospitalizations, surgeries, and ER visits in previous 12 months Vitals Screenings to include cognitive, depression, and falls Referrals and appointments  Orders Placed This Encounter  Procedures   Flu vaccine HIGH DOSE PF(Fluzone Trivalent)   Pneumococcal conjugate vaccine 20-valent (Prevnar 20)   In addition, I have reviewed and discussed with patient certain preventive protocols, quality metrics, and best practice recommendations. A written personalized care plan for preventive services as well as general preventive health recommendations were provided to patient.   Lolita Libra, CMA   03/20/2024   Return in 1 year (on 03/20/2025).  After Visit Summary: (In Person-Printed) AVS printed and given to the patient  Nurse Notes: HM Addressed: Vaccines Given today: flu and pcv 20  "

## 2024-03-20 NOTE — Patient Instructions (Signed)
 Please read your instructions carefully.   GO TO THE LAB :  Get the blood work    Go to the front desk for the checkout Please make an appointment for a physical exam in 4 months   Consider COVID-vaccine if not done recently  Check the  blood pressure regularly Blood pressure goal:  between 110/65 and  130/80. If it is consistently higher or lower, let me know  Continue monitoring your atrial fibrillation      ATRIAL FIBRILLATION: Your heart rate has improved and remained stable since the cardioversion. -Continue anticoagulation therapy. -Follow up with your cardiologist    HYPERTENSION: Your blood pressure is well-controlled with your current medications.    HYPERLIPIDEMIA:   -Continue taking pravastatin  at half a tablet daily. -We checked your cholesterol levels today.  MEMORY IMPAIRMENT: You have been experiencing difficulty with word finding. -We recommend a formal neurocognitive evaluation as suggested by neurology.  GENERAL HEALTH MAINTENANCE: You have received flu and pneumonia vaccinations but not a recent COVID vaccine. -We recommend getting a COVID vaccine if you have not done so recently.    Contains text generated by [employer]bridge.

## 2024-03-21 DIAGNOSIS — G3184 Mild cognitive impairment, so stated: Secondary | ICD-10-CM | POA: Insufficient documentation

## 2024-03-21 NOTE — Assessment & Plan Note (Signed)
 Atrial fibrillation Chronic atrial fibrillation, recently beta-blockers dose adjusted and had a cardioversion 03/16/2024, he seems to be still on atrial fibrillation, tolerates anticoagulation well.  Recommend to see cardiology as scheduled. HTN: Blood pressure is well-controlled with current regimen: Amlodipine , metoprolol , Micardis .  Recent potassium is slightly high recheck today. High cholesterol On pravastatin  40 mg, self decreased to 20 mg only due to aches and pains.  Intolerant to other statins.  Check FLP. MCI: Saw neurology 10/21/2023 due to ER visits with some HAs,  confusion, they noted MRI brain x 2 with no acute changes, they Rx a EEG and referred for a  neurocognitive testing.  They rx to reduce gabapentin  dose as he may account for confusion. So far the patient has declined neurocognitive testing, states he knows his memory is not as good as it was. Fortunately he lives independently, reports he manages all his affairs and still drives without problems. Trigeminal neuralgia: See above, reducing gabapentin  dose. Hyperglycemia: Mild, per chart review, check A1c. General health maintenance Had a flu shot, PNM done today.  Recommend COVID-vaccine.  Declined. RTC 4 months CPX

## 2024-03-30 ENCOUNTER — Ambulatory Visit (HOSPITAL_COMMUNITY): Admitting: Internal Medicine

## 2024-03-30 ENCOUNTER — Encounter (HOSPITAL_COMMUNITY): Payer: Self-pay | Admitting: Internal Medicine

## 2024-03-30 VITALS — BP 116/74 | HR 69 | Ht 70.0 in | Wt 180.8 lb

## 2024-03-30 DIAGNOSIS — D6869 Other thrombophilia: Secondary | ICD-10-CM

## 2024-03-30 DIAGNOSIS — I480 Paroxysmal atrial fibrillation: Secondary | ICD-10-CM

## 2024-03-30 NOTE — Progress Notes (Signed)
 "  Primary Care Physician: Amon Aloysius BRAVO, MD Primary Cardiologist: Vinie JAYSON Maxcy, MD Electrophysiologist: Danelle Birmingham, MD  Referring Physician: Vinie JAYSON Maxcy, MD  Kevin Bass is a 84 y.o. male with a history of paroxysmal AF, HTN, HLD, cryptogenic CVA s/p loop recorder 2018, mitral valve disease s/p MVR 2006, nonobstructive CAD, colon CA, who presents for follow up in the [facility].    Kevin Bass was initially diagnosed with atrial fibrillation after cryptogenic CVA with loop recorder and was started on Eliquis  and treated with metoprolol .  He was last seen by Norleen Heinrich, PA on 09/09/2023 after being seen in the ED for tachycardia and confusion and underwent MRI that showed no acute changes.  During his visit he was in sinus rhythm but noted feeling palpitations and endorsed no missed doses of Eliquis .  He placed a 2-week monitor to assess AF burden with plan to pursue antiarrhythmic therapy if indicated.  ZIO monitor came back showing no evidence of A-fib with plan to follow-up in March 2026.  He contacted heart care on 03/09/2024 with complaint of dizziness x 2 days and elevated heart rate of 116.  He took 2 Xanax  and reported that he started feeling better with heart rate lowering into the 80s.  Message was forwarded to Dr. Maxcy who recommended patient follow-up in the A-fib clinic for possible monitor to evaluate AF burden.  Kevin Bass presents today with his daughter for A-fib follow-up.He experienced an episode of dizziness and a fall approximately one week ago. During the fall, he did not hit his head but was unable to stand on his right leg and required assistance from his daughter to get up. He also experienced a fainting episode during this time. Emergency services were called, but it was determined not to be a stroke. Initially, he had difficulty walking but has since regained the ability to walk normally. His heart rate has been fluctuating, reaching  up to the 120s over the past week, but has stabilized to the 80s in the last few days. He uses an Apple Watch to monitor his heart rate, which alerted him during the dizzy spell. He does not feel different when in atrial fibrillation unless his heart rate increases significantly.He has a history of mitral valve disease and underwent valve replacement surgery approximately 20 years ago. He had an echocardiogram in 2024, which showed mild valve leakage. No shortness of breath, chest discomfort, or difficulty lying flat. He has not missed any doses of Eliquis . He also takes amlodipine , which he associates with swelling in his ankles. He denies taking any over-the-counter medications alcohol use or cold medicine recently. He has not been very active lately but does engage in daily walks and gardening.we discussed treatment for restoring sinus rhythm consisting of rhythm control and DCCV.  He has elected to pursue DCCV and if not able to maintain sinus rhythm post cardioversion will discuss rhythm control at that time.    Follow-up 03/30/2024.  Patient is currently in NSR.  S/p DCCV on 03/16/2024.  He has felt well since cardioversion.  He monitors his rhythm via Apple watch with help from his daughter.  No missed doses of Eliquis .  Today, he denies symptoms of palpitations, chest pain, shortness of breath, orthopnea, PND, lower extremity edema, dizziness, presyncope, syncope, snoring, daytime somnolence, bleeding, or neurologic sequela. The patient is tolerating medications without difficulties and is otherwise without complaint today.    Atrial Fibrillation Management history: History of Sleep Apnea Previous  antiarrhythmic drugs: None Previous cardioversions: 03/16/2024 Previous ablations: None Anticoagulation history: Eliquis   ROS- All systems are reviewed and negative except as per the HPI above.  Past Medical History:  Diagnosis Date   Allergy    Anxiety    Arthritis    hands   Atrial fibrillation  (HCC)    Colon cancer (HCC)    s/p partial colectomy 1990, Cscope neg 7-09, next 2014   DISORDER, MITRAL VALVE    MV recontruction at [facility] 2006----still needs ABX for SBE prophylaxis    Elevated PSA    prostate nodule, Dr Renda   GERD (gastroesophageal reflux disease)    Heart murmur    no problems since surgery   HYPERGLYCEMIA, BORDERLINE 10/16/2008   HYPERLIPIDEMIA 06/15/2006   diet controlled   Hypertension    HYPERTROPHY PROSTATE W/O UR OBST & OTH LUTS 10/16/2008   Neuropathy    Right groin pain    Dr. Tanda   SCC (squamous cell carcinoma) 06/2017   s/p Mohs---Dr Judith   [facility]roke [facility])    Wears partial dentures    lower partial   Past Surgical History:  Procedure Laterality Date   CARDIOVERSION N/A 03/16/2024   Procedure: CARDIOVERSION;  Surgeon: Francyne Headland, MD;  Location: [facility] INVASIVE CV LAB;  Service: Cardiovascular;  Laterality: N/A;   COLECTOMY  1990   HERNIA REPAIR Left 2014   LIH rep w/mesh- March 2014   LOOP RECORDER INSERTION N/A 12/01/2016   Procedure: LOOP RECORDER INSERTION;  Surgeon: Waddell Danelle ORN, MD;  Location: [facility] INVASIVE CV LAB;  Service: Cardiovascular;  Laterality: N/A;   MITRAL VALVE ANNULOPLASTY     [facility] 2006   TEE WITHOUT CARDIOVERSION N/A 10/05/2016   Procedure: TRANSESOPHAGEAL ECHOCARDIOGRAM (TEE);  Surgeon: Mona Vinie BROCKS, MD;  Location: [facility] ENDOSCOPY;  Service: Cardiovascular;  Laterality: N/A;   TRANSESOPHAGEAL ECHOCARDIOGRAM (CATH LAB) N/A 03/16/2024   Procedure: TRANSESOPHAGEAL ECHOCARDIOGRAM;  Surgeon: Francyne Headland, MD;  Location: [facility] INVASIVE CV LAB;  Service: Cardiovascular;  Laterality: N/A;   Prednisolone acetate, Rosuvastatin , and Statins Current Outpatient Medications  Medication Sig Dispense Refill   acetaminophen  (TYLENOL ) 650 MG CR tablet Take 1,300 mg by mouth every 8 (eight) hours as needed for pain.     ALPRAZolam  (XANAX ) 0.25 MG tablet Take 1 tablet (0.25 mg total) by mouth 2 (two) times daily as needed for anxiety. 60  tablet 2   amLODipine  (NORVASC ) 5 MG tablet TAKE 1 TABLET BY MOUTH EVERY MORNING AND TAKE 1/2 TABLET BY MOUTH EVERY EVENING. 135 tablet 3   Arginine  500 MG CAPS Take 1,000 mg by mouth daily.      Baclofen  5 MG TABS Take 1 tablet (5 mg total) by mouth 3 (three) times daily. (Patient taking differently: Take 5-10 mg by mouth See admin instructions. Tale 5 mg in the morning and 10 mg in the evening) 270 tablet 3   Coenzyme Q10 100 MG TABS Take 100 mg by mouth in the morning and at bedtime.     ELIQUIS  5 MG TABS tablet TAKE 1 TABLET BY MOUTH TWICE DAILY 180 tablet 1   gabapentin  (NEURONTIN ) 300 MG capsule Take 1 capsule (300 mg total) by mouth 3 (three) times daily.     hydrocortisone  (ANUSOL -HC) 25 MG suppository Place 1 suppository (25 mg total) rectally 2 (two) times daily as needed for hemorrhoids. 12 suppository 1   loratadine  (CLARITIN ) 10 MG tablet Take 10 mg by mouth at bedtime.     metoprolol  succinate (TOPROL -XL) 50 MG 24 hr  tablet Take 1 tablet (50 mg total) by mouth 2 (two) times daily. May also take 0.5 tablets (25 mg total) daily as needed (may take 25-50 mg daily as needed for HR above 110). 75 tablet 3   Multiple Vitamins-Minerals (OCUVITE PRESERVISION PO) Take 1 capsule by mouth 2 (two) times daily.      nitroGLYCERIN  (NITROSTAT ) 0.4 MG SL tablet Place 1 tablet (0.4 mg total) under the tongue every 5 (five) minutes x 3 doses as needed for chest pain. 25 tablet 3   NON FORMULARY Take 1-2 packets by mouth See admin instructions. Lypo-Spheric vitamin C gel packets- Mix 1-2 packets of gel into juice one to two times a day and drink     NON FORMULARY Take 10 mg by mouth See admin instructions. [employer] Lyco-Sorb (for Prostate & Cardiovascular Health) 10 mg capsules: Take 10 mg by mouth two times a day     Omega-3 Fatty Acids (FISH OIL PO) Take 1,600 mg by mouth in the morning and at bedtime.     omeprazole  (PRILOSEC) 20 MG capsule Take 1 capsule (20 mg total) by mouth daily before  breakfast. 90 capsule 1   OVER THE COUNTER MEDICATION Take 1 tablet by mouth 2 (two) times daily. Information Retention     OVER THE COUNTER MEDICATION Take 1 tablet by mouth 2 (two) times daily. Prosta Strong     OXcarbazepine  (TRILEPTAL ) 150 MG tablet TAKE 1/2 TABLET BY MOUTH IN THE MORNING THEN TAKE 1 TABLET BY MOUTH IN THE EVENING 135 tablet 3   pravastatin  (PRAVACHOL ) 40 MG tablet Take 0.5 tablets (20 mg total) by mouth at bedtime.     telmisartan  (MICARDIS ) 80 MG tablet Take 1 tablet (80 mg total) by mouth daily. 90 tablet 3   Vitamin D, Cholecalciferol, 25 MCG (1000 UT) TABS Take 1,000 Units by mouth daily.     No current facility-administered medications for this encounter.    Physical Exam: BP 116/74   Pulse 69   Ht 5' 10 (1.778 m)   Wt 82 kg   BMI 25.94 kg/m   GEN- The patient is well appearing, alert and oriented x 3 today.   Neck - no JVD or carotid bruit noted Lungs- Clear to ausculation bilaterally, normal work of breathing Heart- Regular rate and rhythm, no murmurs, rubs or gallops, PMI not laterally displaced Extremities- no clubbing, cyanosis, or edema Skin - no rash or ecchymosis noted   Wt Readings from Last 3 Encounters:  03/30/24 82 kg  03/20/24 79.8 kg  03/20/24 80.2 kg    Lab Results  Component Value Date   TSH 2.15 12/01/2022   EKG today demonstrates:   EKG Interpretation Date/Time:  Friday March 30 2024 09:01:03 EST Ventricular Rate:  69 PR Interval:  192 QRS Duration:  84 QT Interval:  394 QTC Calculation: 422 R Axis:   61  Text Interpretation: Normal sinus rhythm Normal ECG When compared with ECG of 16-Mar-2024 11:28, PREVIOUS ECG IS PRESENT Confirmed by Terra Pac ([id number]) on 03/30/2024 9:16:12 AM        Echo Completed 12/29/2022: 1. Left ventricular ejection fraction, by estimation, is 50 to 55%. The  left ventricle has low normal function. The left ventricle has no regional  wall motion abnormalities. Left ventricular diastolic  parameters are  indeterminate.   2. Right ventricular systolic function is normal. The right ventricular  size is normal.   3. Left atrial size was mildly dilated.   4. The mitral valve has  been repaired/replaced. Trivial mitral valve  regurgitation. No evidence of mitral stenosis. The mean mitral valve  gradient is 3.0 mmHg. There is a prosthetic annuloplasty ring present in  the mitral position.   5. The aortic valve is tricuspid. Aortic valve regurgitation is not  visualized. No aortic stenosis is present.   6. The inferior vena cava is normal in size with greater than 50%  respiratory variability, suggesting right atrial pressure of 3 mmHg.   CHA2DS2-VASc Score = 6  The patient's score is based upon: CHF History: 0 HTN History: 1 Diabetes History: 0 Stroke History: 2 Vascular Disease History: 1 Age Score: 2 Gender Score: 0       ASSESSMENT AND PLAN: Paroxysmal Atrial Fibrillation (ICD10:  I48.0) The patient's CHA2DS2-VASc score is 6, indicating a 9.7% annual risk of stroke.   S/p DCCV on 03/16/2024.  Patient is currently in NSR.  His daughter monitors his rhythm via Apple Watch notifications.  We will continue with current therapy without changes at this time.  Continue Toprol  50 mg twice daily.  Secondary Hypercoagulable State (ICD10:  D68.69) The patient is at significant risk for stroke/thromboembolism based upon his CHA2DS2-VASc Score of 6.  Continue Apixaban  (Eliquis ).  No missed doses.  Continue Eliquis  5 mg twice daily.  Dosage is correct based on weight greater than 60 kg and creatinine less than 1.5.  VHD: -s/p MVR at [facility] in 2009 with most recent 2D echo showing stable prosthesis.  HTN: Stable today.    Follow up as scheduled with Dr. Mona.  Follow-up September in A-fib clinic.   Dorn Heinrich, PA-C Afib clinic [address] [phone]   "

## 2024-05-09 ENCOUNTER — Ambulatory Visit (HOSPITAL_COMMUNITY): Admitting: Internal Medicine

## 2024-05-14 ENCOUNTER — Ambulatory Visit: Admitting: Internal Medicine

## 2024-05-21 ENCOUNTER — Ambulatory Visit: Admitting: Neurology

## 2024-07-18 ENCOUNTER — Encounter: Admitting: Internal Medicine

## 2024-11-15 ENCOUNTER — Ambulatory Visit (HOSPITAL_COMMUNITY): Admitting: Internal Medicine

## 2025-03-22 ENCOUNTER — Ambulatory Visit
